# Patient Record
Sex: Female | Born: 1935
Health system: Southern US, Community
[De-identification: ages and names within clinical notes are randomized; demographics above are authoritative.]

## PROBLEM LIST (undated history)

## (undated) DIAGNOSIS — D500 Iron deficiency anemia secondary to blood loss (chronic): Secondary | ICD-10-CM

## (undated) DIAGNOSIS — I10 Essential (primary) hypertension: Secondary | ICD-10-CM

## (undated) DIAGNOSIS — E119 Type 2 diabetes mellitus without complications: Secondary | ICD-10-CM

## (undated) DIAGNOSIS — E7800 Pure hypercholesterolemia, unspecified: Secondary | ICD-10-CM

## (undated) DIAGNOSIS — E039 Hypothyroidism, unspecified: Secondary | ICD-10-CM

## (undated) DIAGNOSIS — K254 Chronic or unspecified gastric ulcer with hemorrhage: Secondary | ICD-10-CM

## (undated) DIAGNOSIS — I5022 Chronic systolic (congestive) heart failure: Principal | ICD-10-CM

## (undated) DIAGNOSIS — T7840XA Allergy, unspecified, initial encounter: Secondary | ICD-10-CM

## (undated) DIAGNOSIS — M1990 Unspecified osteoarthritis, unspecified site: Secondary | ICD-10-CM

## (undated) DIAGNOSIS — J189 Pneumonia, unspecified organism: Secondary | ICD-10-CM

## (undated) DIAGNOSIS — D649 Anemia, unspecified: Secondary | ICD-10-CM

## (undated) DIAGNOSIS — E669 Obesity, unspecified: Secondary | ICD-10-CM

## (undated) DIAGNOSIS — Z9289 Personal history of other medical treatment: Secondary | ICD-10-CM

## (undated) DIAGNOSIS — K31819 Angiodysplasia of stomach and duodenum without bleeding: Secondary | ICD-10-CM

## (undated) HISTORY — PX: TOTAL KNEE ARTHROPLASTY: SHX125

## (undated) HISTORY — DX: Anemia, unspecified: D64.9

## (undated) HISTORY — DX: Chronic systolic (congestive) heart failure: I50.22

## (undated) HISTORY — DX: Allergy, unspecified, initial encounter: T78.40XA

## (undated) HISTORY — PX: REVISION TOTAL KNEE ARTHROPLASTY: SHX767

## (undated) HISTORY — DX: Pure hypercholesterolemia, unspecified: E78.00

## (undated) HISTORY — PX: FRACTURE SURGERY: SHX138

## (undated) HISTORY — DX: Obesity, unspecified: E66.9

## (undated) HISTORY — PX: TOTAL THYROIDECTOMY: SHX2547

## (undated) HISTORY — DX: Hypothyroidism, unspecified: E03.9

## (undated) HISTORY — PX: JOINT REPLACEMENT: SHX530

## (undated) HISTORY — DX: Essential (primary) hypertension: I10

## (undated) HISTORY — PX: TONSILLECTOMY: SUR1361

## (undated) HISTORY — DX: Unspecified osteoarthritis, unspecified site: M19.90

## (undated) SURGERY — Surgical Case
Anesthesia: *Unknown

---

## 2000-02-15 ENCOUNTER — Emergency Department (HOSPITAL_COMMUNITY): Admission: EM | Admit: 2000-02-15 | Discharge: 2000-02-15 | Payer: Self-pay | Admitting: Emergency Medicine

## 2000-02-15 ENCOUNTER — Encounter: Payer: Self-pay | Admitting: Emergency Medicine

## 2001-07-05 ENCOUNTER — Encounter (INDEPENDENT_AMBULATORY_CARE_PROVIDER_SITE_OTHER): Payer: Self-pay | Admitting: Specialist

## 2001-07-05 ENCOUNTER — Inpatient Hospital Stay (HOSPITAL_COMMUNITY): Admission: RE | Admit: 2001-07-05 | Discharge: 2001-07-09 | Payer: Self-pay | Admitting: Orthopedic Surgery

## 2001-07-28 ENCOUNTER — Encounter: Payer: Self-pay | Admitting: Orthopedic Surgery

## 2001-07-28 ENCOUNTER — Inpatient Hospital Stay (HOSPITAL_COMMUNITY): Admission: RE | Admit: 2001-07-28 | Discharge: 2001-08-01 | Payer: Self-pay | Admitting: Orthopedic Surgery

## 2002-02-08 ENCOUNTER — Ambulatory Visit (HOSPITAL_COMMUNITY): Admission: RE | Admit: 2002-02-08 | Discharge: 2002-02-08 | Payer: Self-pay | Admitting: Gastroenterology

## 2003-06-03 ENCOUNTER — Encounter: Payer: Self-pay | Admitting: Orthopedic Surgery

## 2003-06-05 ENCOUNTER — Inpatient Hospital Stay (HOSPITAL_COMMUNITY): Admission: RE | Admit: 2003-06-05 | Discharge: 2003-06-09 | Payer: Self-pay | Admitting: Orthopedic Surgery

## 2003-06-05 ENCOUNTER — Encounter: Payer: Self-pay | Admitting: Orthopedic Surgery

## 2003-07-23 ENCOUNTER — Ambulatory Visit: Admission: RE | Admit: 2003-07-23 | Discharge: 2003-07-23 | Payer: Self-pay | Admitting: Family Medicine

## 2003-12-18 ENCOUNTER — Encounter: Admission: RE | Admit: 2003-12-18 | Discharge: 2003-12-18 | Payer: Self-pay | Admitting: Orthopedic Surgery

## 2004-12-19 HISTORY — PX: SHOULDER OPEN ROTATOR CUFF REPAIR: SHX2407

## 2004-12-25 ENCOUNTER — Observation Stay (HOSPITAL_COMMUNITY): Admission: RE | Admit: 2004-12-25 | Discharge: 2004-12-27 | Payer: Self-pay | Admitting: Orthopedic Surgery

## 2006-05-31 ENCOUNTER — Ambulatory Visit: Payer: Self-pay | Admitting: Family Medicine

## 2006-08-03 ENCOUNTER — Ambulatory Visit: Payer: Self-pay | Admitting: Family Medicine

## 2006-11-11 ENCOUNTER — Ambulatory Visit: Payer: Self-pay | Admitting: Family Medicine

## 2007-04-18 ENCOUNTER — Ambulatory Visit: Payer: Self-pay | Admitting: Family Medicine

## 2007-05-26 ENCOUNTER — Ambulatory Visit: Payer: Self-pay | Admitting: Family Medicine

## 2007-10-06 ENCOUNTER — Ambulatory Visit: Payer: Self-pay | Admitting: Family Medicine

## 2008-02-14 ENCOUNTER — Ambulatory Visit: Payer: Self-pay | Admitting: Family Medicine

## 2008-06-12 ENCOUNTER — Ambulatory Visit: Payer: Self-pay | Admitting: Family Medicine

## 2008-10-11 ENCOUNTER — Ambulatory Visit: Payer: Self-pay | Admitting: Family Medicine

## 2009-01-23 ENCOUNTER — Ambulatory Visit: Payer: Self-pay | Admitting: Family Medicine

## 2009-04-10 ENCOUNTER — Ambulatory Visit: Payer: Self-pay | Admitting: Family Medicine

## 2009-07-22 ENCOUNTER — Ambulatory Visit: Payer: Self-pay | Admitting: Family Medicine

## 2009-08-04 ENCOUNTER — Ambulatory Visit: Payer: Self-pay | Admitting: Family Medicine

## 2010-04-02 ENCOUNTER — Ambulatory Visit: Payer: Self-pay | Admitting: Family Medicine

## 2010-08-03 ENCOUNTER — Ambulatory Visit: Payer: Self-pay | Admitting: Family Medicine

## 2010-12-02 ENCOUNTER — Ambulatory Visit (INDEPENDENT_AMBULATORY_CARE_PROVIDER_SITE_OTHER): Payer: Medicare Other | Admitting: Family Medicine

## 2010-12-02 DIAGNOSIS — E119 Type 2 diabetes mellitus without complications: Secondary | ICD-10-CM

## 2010-12-02 DIAGNOSIS — I10 Essential (primary) hypertension: Secondary | ICD-10-CM

## 2010-12-02 DIAGNOSIS — R609 Edema, unspecified: Secondary | ICD-10-CM

## 2010-12-02 DIAGNOSIS — B351 Tinea unguium: Secondary | ICD-10-CM

## 2011-02-05 NOTE — Discharge Summary (Signed)
NAME:  Connie [name], Connie [name]               ACCOUNT NO.:  [id number]   MEDICAL RECORD NO.:  [id number]                   PATIENT TYPE:  INP   LOCATION:  0463                                 FACILITY:  [facility]   PHYSICIAN:  Ollen Gross, M.D.                 DATE OF BIRTH:  [birth year]   DATE OF ADMISSION:  06/05/2003  DATE OF DISCHARGE:  06/09/2003                                 DISCHARGE SUMMARY   ADMISSION DIAGNOSES:  1. Failed left total knee arthroplasty.  2. Hypertension.  3. Diabetes mellitus type 2.  4. Hypothyroidism.  5. Hypercholesterolemia.   DISCHARGE DIAGNOSES:  1. Aseptic loosening left total knee, status post left total knee     arthroplasty revision.  2. Postoperative blood loss anemia.  3. Postoperative hypokalemia improved.  4. Hypertension.  5. Diabetes mellitus type 2.  6. Hypothyroidism.  7. Hypercholesterolemia.   PROCEDURE:  The patient was taken to the operating room on June 05, 2003, and underwent a left total knee arthroplasty revision by Dr. Ollen Gross, assisted by Patrica Duel, P.A.-C., under general anesthesia.  Hemovac drain x1, tourniquet time 61 minutes at 300 mmHg and down for 10  then up an additional 28 minutes at 300 mmHg.   CONSULTATIONS:  None.   BRIEF HISTORY:  The patient is a 77 year old female who had a left total  knee done about 8-10 years ago by Dr. Meade Maw.  She recently developed some  significant pain and dysfunction.  X-rays showed complete wear through the  polyethylene with varus deformity of the knee.  She had evident lysis under  the tibial tray and it was felt that she had polyethylene wear and also  possibly to include loosing of the total knee prosthesis and felt she would  benefit from undergoing a revision.  The patient was subsequently admitted  to the hospital.   LABORATORY DATA:  CBC:  On admission, hemoglobin 13.3, hematocrit 39.4,  white cell count 8.6, red cell count 4.83, differential  within normal  limits.  Postoperative hemoglobin 10.4, hematocrit 30.5.  Last noted  hemoglobin 8.8, hematocrit 25.3.  PT/PTT on admission 12.8/31 respectively  with INR 0.9.  Serial ProTimes followed per Coumadin protocol.  Last noted  PT/INR 18.9/1.9.  Glucose on admission elevated at 175.  Remaining chemistry  panel all within normal limits.  Serial BMETs were followed.  Potassium  dropped from 4.1 down to 3.3.  It is back up to 3.7.  Calcium dropped from  10.2 down to 7.7.  Urinalysis on admission positive glucose;  otherwise,  negative.  Blood group type A positive.   EKG dated June 03, 2003, unusual P axis, possible ectopic atrial  rhythm, possible inferior infarction age undetermined, nonspecific ST and T  changes confirmed by Dr. Meade Maw.   A two-view chest x-ray June 03, 2003:  Slight cardiomegaly without  acute abnormality of the true chest.   HOSPITAL COURSE:  The patient was admitted to  [facility], taken to  the OR and underwent the above-stated procedure without complication.  The  patient tolerated the procedure well, later sent to recovery, and then to  the orthopedic floor for continued postoperative care.  Vital signs were  followed.  The patient was given 24 hours of postoperative IV antibiotics in  the form of Ancef.  She was placed on Coumadin for DVT prophylaxis.  PT and  OT were consulted to assist with gait training, ambulation, and ADLs. She  was placed weightbearing as tolerated to the left lower leg.  Hemovac drain  placed at the time of surgery was pulled on postoperative day #1.  She had a  fairly rough night the first night or two.  She felt some congestion, some  drainage.  She was placed on some decongestants for the drainage.  She was  initially placed on PCA and p.o. analgesics.  She was later weaned over to  p.o. medications.  The PCA was discontinued.   She did have a drop in her potassium down to 3.3 and was placed on  K-Dur.  The potassium came back up.  From the therapy standpoint, the patient  actually did very well with her therapy.  She was up ambulating  approximately 125 feet by day #2 and then greater than 200 feet by day #3.  She had been weaned over to p.o. medications and her pain was under much  better control.  She was doing extremely well with physical therapy.  Therefore, she was decided to be discharged home by day #4, June 09, 2003.   PLAN:  1. The patient is discharged home on June 09, 2003.  2. For discharge diagnoses, please see above.  3. Discharge medications:  Percocet, Robaxin, Coumadin.  4. Diet:  Low-sodium diabetic diet.  5. Activity:  Weightbearing as tolerated.  Home health PT and home health     nursing for total knee protocol.  6. Followup in two weeks from surgery.   DISPOSITION:  To home.   CONDITION ON DISCHARGE:  Improved.     Alexzandrew Tessie Fass, P.A.-C           Ollen Gross, M.D.    ALP/MEDQ  D:  07/12/2003  T:  07/12/2003  Job:  [id number]

## 2011-02-05 NOTE — Discharge Summary (Signed)
[facility]  Patient:    Connie [name], Connie [name] Visit Number: [id number] MRN: [id number]          Service Type: SUR Location: 4W 0452 01 Attending Physician:  Loanne Drilling Dictated by:   Marcie Bal Troncale, P.A.C. Admit Date:  07/28/2001 Discharge Date: 08/01/2001                             Discharge Summary  PRINCIPAL DIAGNOSES: 1. Failed right total knee arthroplasty. 2. Hyponatremia, resolved. 3. Positive fluid balance.  SECONDARY DIAGNOSES: 1. Hypothyroidism. 2. Hypertension. 3. Hyperlipidemia. 4. Type 2 diabetes.  SURGICAL PROCEDURE:  Resection arthroplasty right knee with insertion of antibiotic impregnated spacer by Dr. Lequita Halt with the assistance of Dr. Ranell Patrick on July 05, 2001.  Please see operative summary for further details.  CONSULTATIONS:  None.  LABORATORY DATA:  Preoperative hemoglobin and hematocrit were 12.6 and 37.3, respectively.  Postoperatively, she remained stable with a hemoglobin in the 10.3 to 10.9 range.  Preoperative PT/INR were 12.5 and 0.9, respectively, with a PTT of 28.  Postoperative prior to discharge her INR was 2.0.  Preoperative sodium and potassium were 143 and 4.2, respectively.  BUN and creatinine of 11 and 0.7, respectively.  Postoperatively, she had a low sodium of 132, which improved to 133 on July 08, 2001.  Potassium remained within normal limits. Urinalysis preoperatively was all within normal limits, all negative. Cultures from the right knee showed no organisms and no white blood cells.  No anaerobes were isolated.  CHIEF COMPLAINT:  Problems with my right knee.  HISTORY OF PRESENT ILLNESS:  The patient is a 77 year old female who had undergone total knee arthroplasty on the left in 1993, and on the right in 1998.  About a year ago she began developing medial aspect pain about the right knee.  She had progressive angulation and deformities to that knee and worsening pain and  motion.  She also had some laxity with valgus stressing. X-rays demonstrated some loosening of the tibial component.  Because of this, it was elected she undergo surgical intervention.  There was also concern that she could have an infection going on concurrently.  She was taken to the operating room on July 05, 2001, for surgical intervention.  Prior to surgery, she received preoperative medical evaluation clearance by Dr. Carollee Sires. from [facility].  HOSPITAL COURSE:  Following the surgical procedure, the patient was taken to the PACU in stable condition, transferred to the orthopedic floor in good condition.  She was started on Coumadin for deep venous thrombosis prophylaxis.  Wound was examined on postoperative day #2, and subsequently found to be clean, dry, and intact without signs or symptoms of infection. She did have a low sodium postoperatively, and had a fluid restriction.  This gradually did improve.  Also, she was found to have positive fluid balance, and was given some Lasix with good results.  Cultures were followed postoperatively, and there was no evidence of any anaerobic or aerobic growth.  Cultures were followed anticipating a final culture prior to discharge.  By postoperative day #4, she did have final cultures which were again negative with no growth anaerobic or aerobic.  Therefore, it was felt she was stable and ready for discharge home.  DISPOSITION:  The patient is being discharged home with [facility].  DIET:  Low sodium diabetic.  FOLLOWUP:  Dr. Lequita Halt in approximately 10 days.  Call  [phone] for an appointment.  ACTIVITY:  Weightbearing as tolerated in the knee immobilizer.  Once daily dressing changes to the knee.  She may shower on postoperative day #5.  DISCHARGE MEDICATIONS: 1. Trinsicon one p.o. t.i.d. p.c. 2. Robaxin 500 mg p.o. q.8h. p.r.n. spasm. 3. Percocet 5/325 mg one or two p.o. q.4-6h. p.r.n.  pain. 4. Coumadin per pharmacy dosing. 5. She is to resume her regular home medications.  CONDITION ON DISCHARGE:  Good and improved. Dictated by:   Marcie Bal Troncale, P.A.C. Attending Physician:  Loanne Drilling DD:  08/23/01 TD:  08/23/01 Job: [id number]

## 2011-02-05 NOTE — Discharge Summary (Signed)
[facility]  Patient:    Connie [name], Connie [name] Visit Number: [id number] MRN: [id number]          Service Type: SUR Location: 4W 0452 01 Attending Physician:  Loanne Drilling Dictated by:   Irena Cords, P.A.-C. Admit Date:  07/28/2001 Discharge Date: 08/01/2001                             Discharge Summary  PRIMARY DIAGNOSES: 1. Resection arthroplasty, right knee. 2. Acute blood loss anemia.  SECONDARY DIAGNOSES: 1. Hypertension. 2. Hypothyroidism. 3. Hyperlipidemia. 4. Type 2 diabetes.  SURGICAL PROCEDURE:  Reimplantation right total knee arthroplasty by Dr. Lequita Halt with assistance of Dr. Ranell Patrick on July 28, 2001.  Please see operative summary for further details.  CONSULTATIONS:  Dr. Thomasena Edis in physical medicine rehabilitation.  LABORATORY DATA:  Preop H&H were 10.5 and 31.0 on November 5.  She reached a low on November 10 of 6.9 and 19.8, respectively.  This improved after transfusion to 9.0 and 25.6, respectively.  PT and INR were 22.9 and 2.6, respectively on November 5.  On November 8, she had an INR of 1.3.  This then improved to the range of 1.8-2.1 prior to discharge.  Preop sodium and potassium 141 and 3.8.  This remained stable postop and prior to discharge was 137 to 3.6, respectively.  BUN and creatinine remained stable at 11 and 0.6-0.8, respectively.  Urinalysis on November 5 showed moderate hemoglobin, moderate leukocyte esterase, negative nitrite.  She had rare epithelial cells and 3-6 red blood cells and 3-6 white blood cells with just rare bacteria. Gram stains from the knee showed rare WBCs and rare gram-positive rods; that was on November 8.  Wound culture from November 8 showed no growth x 2 days. Anaerobic also no growth.  X-ray from November 8 showed impression of a fracture associated with the medial aspect of the proximal tibia and questionable fracture distal aspect of the distal portion of the  prosthesis.  HOSPITAL COURSE:  Following the surgical procedure, the patient was taken to PACU in stable condition and transferred to the orthopedic floor in good condition.  She did well during her hospital stay.  She was started in CPM full range of motion.  She was on Coumadin for DVT prophylaxis.  She received routine postoperative antibiotics and pain medications.  Postop day two, she was transfused two units of packed red blood cells for a low hemoglobin of 6.9.  After transfusion, she improved to 9.0 and remained stable afterwards. The incision was examined on postop day two and subsequently found to be clean, dry, and intact without signs or symptoms of infection.  On November 11, physical medicine rehabilitation evaluated the patient but felt she was progressing so well that she would be a candidate for home health physical therapy.  By postop day four, she was stable and ready for discharge home.  DISCHARGE PLANS:  She is being discharged home with [facility].  DISCHARGE MEDICATIONS: 1. Colace 100 mg p.o. b.i.d. 2. Trinsicon 1 p.o. t.i.d. x 3 weeks. 3. Coumadin per pharmacy dosing. 4. Percocet 5/325, 1-2 p.o. q.4-6 hours p.r.n. pain. 5. Robaxin 500 mg p.o. q.8h. p.r.n. spasm. 6. She is to resume her home medications.  DIET:  Low sodium diabetic diet.  FOLLOW-UP:  In two weeks with Dr. Lequita Halt.  Call [phone] for an appointment.  ACTIVITY:  Weightbearing as tolerated.  She is not to exceed 90  degrees of knee flexion.  She is to have home CPM through [facility].  Genevieve Norlander for home health needs.  CONDITION ON DISCHARGE:  Improved. Dictated by:   Irena Cords, P.A.-C. Attending Physician:  Loanne Drilling DD:  08/23/01 TD:  08/23/01 Job: [id number]

## 2011-02-05 NOTE — H&P (Signed)
[facility]  Patient:    Connie [name], FOUCHER Visit Number: [id number] MRN: [id number]          Service Type: SUR Location: 4W 0452 01 Attending Physician:  Loanne Drilling Admit Date:  07/28/2001 Discharge Date: 08/01/2001                           History and Physical  BRIEF HISTORY:  This is a 77 year old female who had a resection arthroplasty done 2 weeks prior to this admission and at that time had revision planned secondary to abnormality; however, tissue quality was consistent with probable infection; therefore, it was postponed, although her cultures ended up coming back negative.  The wound has healed nicely.  She now presents for reimplantation.  PAST MEDICAL Histotrophic SURGICAL HISTORY:  All other medical records are unchanged from her previous 10/16 visit.  PHYSICAL EXAMINATION:  Other than showing a well-healed incision and no significant effusion of the right knee is unchanged.  PLAN:  For reimplantation of right total knee arthroplasty.  Please see previous history and physical for details. Attending Physician:  Loanne Drilling DD:  08/29/01 TD:  08/30/01 Job: [id number]

## 2011-02-05 NOTE — Op Note (Signed)
[facility]  Patient:    Connie [name], Connie [name] Visit Number: [id number] MRN: [id number]          Service Type: SUR Location: 4W 0452 01 Attending Physician:  Loanne Drilling Dictated by:   Ollen Gross, M.D. Proc. Date: 07/28/01 Admit Date:  07/28/2001                             Operative Report  PREOPERATIVE DIAGNOSIS:  Resection arthroplasty right knee.  POSTOPERATIVE DIAGNOSIS:  Resection arthroplasty right knee.  PROCEDURE:  Reimplantation, right total knee arthroplasty.  SURGEON:  Ollen Gross, M.D.  ASSISTANT:  Almedia Balls. Ranell Patrick, M.D.  ANESTHESIA:  General.  ESTIMATED BLOOD LOSS:  300 .  DRAINS:  Hemovac x 1.  COMPLICATION:  Stable condition to recovery.  CLINICAL NOTE:  The patient is a 77 year old female who had a resection arthroplasty done two weeks ago and at the time we planned new revision secondary to abnormality and tissue quality consistent with probable infection.  Her cultures all ended up coming back negative.  The wound has now healed nicely.  She presents for reimplantation.  PROCEDURE IN DETAIL:  After the successful administration of general anesthetic, a tourniquet was placed high on her right thigh and the right lower extremity prepped and draped in the usual sterile fashion.  A 10 blade was used to make an incision through the previous incision, down through the subcutaneous tissue to the level of the extensor mechanism.  A fresh blade was used to make a medial parapatellar arthrotomy, and then the old sutures were removed.  Soft tissue of the medial tibia subperiosteally elevated to the joint line with the knife and semimembranosus dispersed with a curved osteotome.  Soft tissue of the proximal and lateral tibia was elevated, attention being made to avoid the patellar tendon on the tibial tubercle. A quadriceps snip was again necessary to evert the patella.  The knee was then flexed.  The antibiotic  spacer removed.  Fluid was sent for Gram stain CNS. The granulation tissue was removed and the medial and lateral gutters created around the distal femur.  Excellent exposure is now obtained.  Canal reaming is then performed up to 14 mm on the femoral side.  We got up to a 13 on the tibial side and decided to use a 13 cemented short stem.  Intermedullary alignment guide was placed on the tibia and the proximal portion is resected another 1-2 mm to get down to a healthy-appearing bony rim.  She really did not have much of a tibial defect at all.  The femoral side is then addressed with the 14 mm alignment rod placed in 5 degrees of valgus and a distal femoral cut made.  In order to get any resection at all we had to go up to 4 mm, thus we are going to use the distal augments medial and lateral out 4 mm.  The AP block is placed for a size 2.5 and 12.5 mm spacer placed to mark rotation at 90 degrees of flexion.  The trial had to be placed in the +2 position, effectively raising the stem and lowering the femoral component so it would rest on the anterior cortex of the femur.  The AP cuts are subsequently made.  This would be a 4 mm posterolateral defect which is going to be made up with an augment.  The intercondylar block is placed and then chamfer cuts in the  intercondylar made for a posterior stabilized prosthesis. The trial femur with a size 2.5 and then 4 mm distal, medial and lateral augments and 4 mm posterolateral augment was then placed with a 14 x 125 trial stem.  We had an excellent fit.  On the tibial side the size 2 tibia with a 60 mm cemented stem with a 13 mm diameter was placed.  We had good fit on all sides.  Then I started with a 15 mm trial, which led to hyperextension.  The 17.5 also had some hyperextension.  At 20 she had great flexion and extension gap, with excellent varus valgus stability throughout and no hyperextension.  At this point we addressed the patella.   Initial patella thickness is 12.  We just used the saw to take her down to 11 and create a fresh surface.  The plug holes were then drilled for a 38 mm patella and a trial patella was placed which tracks well.  The trial components are removed and then the tibia was punched proximally. The tourniquet was released; total time of 61 min, and then no major bleeding is identified.  We left it down for approximately 10 min.  We prepared the permanent implants.  Once again, on the femoral side with a size 2.5 femur with a 14 x 125 stem in a +2 position, and 4 mm distal, medial and lateral augments and a 4 mm posterolateral augment.  On the tibial side we had a size 2 with a 60 mm x 13 cemented stem.  The cement restrictor is trialed, a size 2 is most appropriate; then placed at the appropriate level in the tibial canal.  At this point we had the tourniquet for 10 min.  The extremity was again wrapped in Esmarch and the tourniquet reinflated.  The cement is then mixed and pressurized into the tibial canal.  The tibial component is then cemented into position, and the femoral component subsequently cemented into position and a 20 mm spacer is placed with the knee held with full extension.  All extruded cement removed.  On the patella a 38 is cemented and held to clamp. Once the cement is fully hardened the permanent 20 mm stabilized plus insert is placed to the tibial tray.  Once again she had excellent stability throughout.  The wound is then copiously irrigated with antibiotic solution and extensor mechanism and the quadriceps snip closed over Hemovac drain with interrupted PDS.  She had some slight attenuation of her patellar tendon, but the insertion was intact, but decided to augment with placing a Mitek anchor and then the Ethibond sutures through the tendon to help shore it up.  Once the extensor mechanism was closed, then subcutaneous was closed with interrupted 2-0 Vicryl.  It was  noted prior to closure that there appeared to be some cement outside of the medial aspect of the tibia proximally.  We found this  and removed it.  We then took an AP and lateral of the knee and showed all the components to be in excellent position, with no evidence of any hole in the tibia or any fracture.  It is possible one of the trial reamers had perforated a small hole, and that is how the cement came out.  This was all proximal to the tip of the stem.  It did not provide any structural compromise.  Once the x-ray was completed, then the skin is stapled and the drain hooked to suction.  The tourniquet is released  with a total of 101 min, with 61 min being the first time and 40 min being the second.  A bulky sterile dressing and then knee immobilizer applied. She is subsequently awakened and transported to recovery room in stable condition.  Of note, halfway through the procedure we got back the results of the STAT Gram stain.  There were rare white cells and it said there was a rare gram-positive rod.  I called the lab to check on this, as that is generally contaminant and I was told that it was one gram-negative rod in every three to four high-power fields.  Given the overall clinical situation and that organism being a frequent contaminant, I felt that it was safe to proceed with reimplanting her.  She had all negative workup otherwise.  We did utilize 2 g of vancomycin in her cement. Dictated by:   Ollen Gross, M.D. Attending Physician:  Loanne Drilling DD:  07/28/01 TD:  07/31/01 Job: [id number]

## 2011-02-05 NOTE — H&P (Signed)
NAME:  Connie [name], Connie [name]               ACCOUNT NO.:  [id number]   MEDICAL RECORD NO.:  [id number]                   PATIENT TYPE:  INP   LOCATION:  NA                                   FACILITY:  [facility]   PHYSICIAN:  Ollen Gross, M.D.                 DATE OF BIRTH:  Jun 02, 1934   DATE OF ADMISSION:  06/05/2003  DATE OF DISCHARGE:                                HISTORY & PHYSICAL   CHIEF COMPLAINT:  Pain in my left knee.   HISTORY OF PRESENT ILLNESS:  Connie [name] is a 77 year old female status  post right total knee revision in November 2002.  She now states she is  having considerable problems with the left knee which has been going on for  a couple months now.  She states she does not remember specific injury, but  notes the knee has become somewhat unstable and she has developed a lot of  swelling in her leg.  She has not had any fevers, chills, or systemic  symptoms.  She had a left total knee replacement by Maisie Fus L. Presson, M.D.  in 1994 and she has been functioning fairly well until this recent episode,  she states.  X-ray reveal the polyethylene is completely worn through the  medial compartment of the left knee with some lysis present underneath the  medial and lateral tibial plateau with no evidence of any compartment  loosening.  Ollen Gross, M.D. feels at this point that it is best for her  to undergo surgery again being left knee polyethylene liner exchange versus  a left total knee revision with bone grafting.  The patient agrees.  Risks  and benefits of the surgery have been discussed with the patient.  The  patient wishes to proceed.   PAST MEDICAL HISTORY:  1. Hypertension.  2. Diabetes mellitus type 2.  3. Hypothyroidism.  4. Hypercholesterolemia.   PAST SURGICAL HISTORY:  1. Left total knee arthroplasty.  2. Right total knee arthroplasty.  3. Right total knee revision.  4. Removal of goiter and thyroidectomy.   MEDICATIONS:  1. Lipitor 40 mg  one p.o. daily.  2. Synthroid 200 mg one p.o. daily.  3. Benazepril/HCTZ 20/12.5 mg one p.o. daily.  4. Avandia 4 mg one p.o. daily.  5. Norvasc 5 mg one p.o. daily.   ALLERGIES:  No known drug allergies.   SOCIAL HISTORY:  The patient denies any tobacco or alcohol use.  She is  married.  She lives in a [place] house with three steps entering the  house.  Her husband will be her care giver after surgery.   FAMILY HISTORY:  Unknown due to the fact that her parents died when she was  very young.   REVIEW OF SYSTEMS:  GENERAL:  Denies fevers, chills, night sweats, bleeding  tendencies.  CNS:  Denies blurry or double vision, seizures, headaches,  paralysis.  RESPIRATORY:  Denies shortness of breath, productive cough,  hemoptysis.  CARDIOVASCULAR:  Denies chest pain, angina, orthopnea.  GASTROINTESTINAL:  Denies nausea, vomiting, diarrhea, constipation, melena,  bloody stools.  GENITOURINARY:  Denies dysuria, hematuria, discharge.  MUSCULOSKELETAL:  Pertinent to HPI.   PHYSICAL EXAMINATION:  VITAL SIGNS:  Blood pressure 158/72, pulse 72,  respirations 12.  GENERAL:  Well-developed, well-nourished 77 year old female.  HEENT:  Normocephalic, atraumatic.  Pupils are equal, round, and reactive to  light.  NECK:  Supple.  No carotid bruit noted.  CHEST:  Clear to auscultation bilaterally.  No wheezes or crackles.  HEART:  Regular rate and rhythm.  No murmurs, rubs, or gallops.  ABDOMEN:  Soft, nontender, nondistended.  Positive bowel sounds x4.  EXTREMITIES:  Whole left lower extremity is swollen.  It is nontender in the  popliteal area.  Range of motion is just from 10 to 80 degrees.  There is  tenderness medially and laterally.  On full extension she has about 5 mm of  valgus laxity, but with a firm pin point on the medial collateral ligament.  The same laxity is present down to 80 degrees of flexion.  There is slight  joint effusion present.  SKIN:  No rashes or lesions.    LABORATORIES:  X-rays reveal polyethylene is completely worn through the  medial compartment of the left knee with some lysis present underneath the  medial and lateral tibial plateaus, but no evidence of any component  loosening.  She now has varus deformity because of loss of the polyethylene  on the medial side.   IMPRESSION:  1. Failed left total knee arthroplasty.  2. Hypertension.  3. Diabetes mellitus type 2.  4. Hypothyroidism.  5. Hypercholesterolemia.   PLAN:  The patient will be admitted to [facility] on June 05, 2003 and undergo a left knee polyethylene liner exchange versus left total  knee revision with tibial bone grafting by Ollen Gross, M.D. on June 05, 2003.     Clarene Reamer, P.A.-C.                   Ollen Gross, M.D.    SW/MEDQ  D:  06/03/2003  T:  06/03/2003  Job:  [id number]   cc:   Sharlot Gowda, M.D.  [address] [place], [place] [address]  Fax: [phone]

## 2011-02-05 NOTE — H&P (Signed)
[facility]  Patient:    Connie [name], Connie [name]. Visit Number: [id number] MRN: [id number]          Service Type: Attending:  Ollen Gross, M.D. Dictated by:   Druscilla Brownie. Shela Nevin, P.A. Adm. Date:  07/05/01   CC:         John L. Elnoria Howard., D.O., [address],  [place], [place] [address] [phone])   History and Physical  DATE OF BIRTH:  [birth year]  CHIEF COMPLAINT:  "Problems with my right knee."  HISTORY OF PRESENT ILLNESS:  This 77 year old female who has had total knee replacement arthroplasties, the left one in 1993 and the right one done in 1998.  About a year ago, she began having pain in the medial aspect of her right knee.  She noticed progressive bowing and varus deformity.  She is having more and more difficulty getting about and now has to use a cane in her left hand.  She had some mild limitation of range of motion from 5-105 degrees.  She has some valgus laxity with stressing.  No crepitus to range of motion.  X-rays show a loose tibial component, particularly on the medial aspect.  The tibial component has going into into a varus tilt as well. It was felt that this patient has a loose tibial component of her total knee prosthesis.  It is felt that she will benefit from revision and she is being admitted for the same.  Complications of surgery have been discussed with her. We have also discussed the fact that she could very well have an infected process going on in there, however, that is way down on our list.  PAST MEDICAL HISTORY:  The patient has hypertension, hypothyroidism, and hyperlipidemia.  She now has diabetes, type 2.  CURRENT MEDICATIONS: 1. Lipitor 20 mg one q.d. 2. Synthroid 200 mcg one q.d. 3. Triamterene HCTZ 37.5/25 mg one q.d. 4. Prandin 0.5 mg one b.i.d. 5. Avandia 4 mg one q.d. 6. Celebrex. 7. Accupril 10 mg one q.d. 8. Darvocet for discomfort.  ALLERGIES:  No known drug allergies.  FAMILY PHYSICIAN:   John L. Elnoria Howard., D.O., [facility].  FAMILY HISTORY:  Noncontributory.  SOCIAL HISTORY:  The patient neither smokes or drinks and is married.  REVIEW OF SYSTEMS:  CNS:  No seizure disorder, paralysis, numbness, or double vision.  Respiratory:  No productive cough.  No hemoptysis.  No shortness of breath.  Cardiovascular:  No chest pain.  No angina.  No orthopnea. Gastrointestinal:  No nausea, vomiting, melena, or bloody stools. Genitourinary:  No discharge, dysuria, or hematuria.  Musculoskeletal: Primarily as in present illness with the right knee.  PHYSICAL EXAMINATION:  An alert, cooperative, friendly, 77 year old, white female.  VITAL SIGNS:  Blood pressure 152/78, pulse 72, respirations 12.  HEENT:  Normocephalic.  PERRLA.  EOM intact.  The oropharynx is clear.  CHEST:  Clear to auscultation.  No rhonchi, rales, or wheezes.  HEART:  Regular rate and rhythm.  No murmurs are heard.  ABDOMEN:  Soft and nontender.  Liver and spleen not felt.  GENITALIA:  Not done, not pertinent to present illness.  RECTAL:  Not done, not pertinent to present illness.  PELVIC:  Not done, not pertinent to present illness.  BREASTS:  Not done, not pertinent to present illness.  EXTREMITIES:  Right knee as in the history of present illness above.  ADMISSION DIAGNOSES: 1. Loosening of right total knee replacement arthroplasty (tibial component). 2. Hypothyroidism. 3. Hypertension.  4. Hyperlipidemia. 5. Type 2 diabetes.  PLAN:  The patient will undergo revision of right total knee replacement arthroplasty.  Plan to have home health after this regular hospitalization. She has been cleared preoperatively by Jonny Ruiz L. Elnoria Howard., D.O., at [facility]. Dictated by:   Druscilla Brownie. Shela Nevin, P.A. Attending:  Ollen Gross, M.D. DD:  07/03/01 TD:  07/03/01 Job: [id number]

## 2011-02-05 NOTE — Procedures (Signed)
[facility]  Patient:    Connie, [name] Visit Number: [id number] MRN: [id number]          Service Type: END Location: ENDO Attending Physician:  Orland Mustard Dictated by:   Llana Aliment. Randa Evens, M.D. Proc. Date: 02/08/02 Admit Date:  02/08/2002 Discharge Date: 02/08/2002   CC:         Ronnald Nian, M.D.   Procedure Report  DATE OF BIRTH:  [birth year]  PROCEDURE:  Colonoscopy.  SURGEON:  James L. Randa Evens, M.D.  MEDICATIONS:  Cefazolin 1 g due to recent knee replacement, fentanyl 50 mcg, and Versed 6 mg IV.  SCOPE:  Olympus pediatric video colonoscope.  INDICATIONS:  Colon cancer screening  DESCRIPTION OF PROCEDURE:  The procedure had been explained to the patient and consent obtained.  With the patient in the left lateral decubitus position, the scope was inserted and advanced under direct visualization.  The prep was quite good.  Using some abdominal pressure, I reached the cecum.  The ileocecal valve, and appendiceal orifice were seen.  The scope was withdrawn. The cecum, ascending colon, hepatic flexure, transverse colon, splenic flexure, descending, and sigmoid colon were seen well upon removal.  No polyps were seen.  Moderate diverticulosis in the sigmoid colon.  The rectum was seen well and was free of polyps.  ASSESSMENT: 1. Normal colon and no evidence of colon polyps. 2. Moderate diverticulosis.  PLAN:  Recommend she follow up with Dr. Susann Givens, her family physician with yearly Hemoccults.  Consider another colon screening in 5-10 years depending on the recommendations at that time. Dictated by:   Llana Aliment. Randa Evens, M.D. Attending Physician:  Orland Mustard DD:  02/08/02 TD:  02/10/02 Job: [id number]

## 2011-02-05 NOTE — Op Note (Signed)
NAME:  Connie [name], Connie [name]               ACCOUNT NO.:  [id number]   MEDICAL RECORD NO.:  [id number]                   PATIENT TYPE:  INP   LOCATION:  0002                                 FACILITY:  [facility]   PHYSICIAN:  Ollen Gross, M.D.                 DATE OF BIRTH:  June 13, 1934   DATE OF PROCEDURE:  06/05/2003  DATE OF DISCHARGE:                                 OPERATIVE REPORT   PREOPERATIVE DIAGNOSIS:  Left knee aseptic loosening of total knee.   POSTOPERATIVE DIAGNOSES:  Left knee aseptic loosening of total knee.   PROCEDURE:  Left total knee arthroplasty revision.   SURGEON:  Ollen Gross, M.D.   ASSISTANT:  Alexzandrew L. Julien Girt, P.A.   ANESTHESIA:  General.   DRAINS:  Hemovac x1.   COMPLICATIONS:  None.   TOURNIQUET TIME:  61 min at 300 mmHg, and down for 10 min then up an  additional 28 min at 300 mmHg.   CONDITION:  Stable to recovery room.   BRIEF CLINICAL NOTE:  [name] is a 77 year old female who had a left  total knee arthroplasty done 8-10 years ago by Dr. Fraser Din.  She recently  developed significant pain and dysfunction.  Radiographs showed complete  wear through her polyethylene with varus deformity.  She had evident lysis  underneath the tibial tray.  Given the significant pain and component  failure, she presents now for either polyethylene versus total knee  arthroplasty revision.   PROCEDURE IN DETAIL:  After successful administration of general anesthetic,  a tourniquet was placed high on the left thigh and the left lower extremity  prepped and draped in the usual sterile fashion.  Extremity was wrapped in  Esmarch.  The knee was flexed.  Tourniquet inflated to 300 mmHg.  A standard  midline incision was made with a 10 blade through subcutaneous tissue to the  level of the extensor mechanism. Then a fresh blade was used to make a  medial parapatellar arthrotomy.   I really did not encounter any fluid within the joint.  There was a  tremendous amount of synovitis and a synovectomy was performed.  Then I  elevated the soft tissue over the proximal and medial tibia to the joint  line with the knife, and the membranes were dispersed with a curved  osteotome.  Soft tissue from the proximal lateral tibia was also elevated,  attention being paid to avoiding the patellar tendon on the tibial tubercle.  I was able to evert the patella, flex the knee 90 degrees, and then we noted  that there was complete breakthrough of the medial side of the polyethylene,  with polyethylene flakes present throughout the joint.   I removed the polyethylene and then I was very concerned about the tibia  possibly being loose.  So, I just placed a 1/4-inch straight osteotome,  interfaced between the tibial component and cement.  The component was  tenting in and out of the cement;  consistent with gross loosening.   I removed the active component.  I then used 1/2 to 1/4 inch straight  osteotomes to disrupt the interface between prosthesis and cement at the  femoral side.  We easily removed this femoral component without any bone  loss.  I then removed all the cement off the surface of both the tibia and  femur; took out the cement plugs.  We did a synovectomy posteriorly also.   I then reamed the canals on the femoral side to 14 mm; tibial side 13 mm.  I  thoroughly irrigated the canals prior to reaming.  I then placed the  intramedullary dye down on the tibial side with a 13 mm dummy stem.  I put  the 3-degree cutting block on, just to remove about 2 mm off the tibial  surface.  This was nice and flush cut.  I needed the offset tibial tray to  clip the stem medial and tray lateral in order to deem appropriate covers.  It was a size 2 tray, with a 13 x 30 cemented systemic extension.  The trial  fit perfectly.   With the 14 mm Gummi stem on the femoral side, we placed a distal cutting  block in order to just skim bone off the surface.  I used  the 5 degree left  valgus alignment guide.  The block is pinned and with the 0 cut there is  essentially no bone removed at all.  Thus we went to 4 mm cut and we were  going to place 4 mm distal augments both medial and lateral.   The AP cutting block was sized to 2.5.  We then placed a 2.5 as it was  determined to be the most appropriate size.  We put the  10 mm spacer block in, held the knee in 90 degrees of flexion and set our  rotations so we would get a symmetric flexion gap.  The rotation ended up  corresponding with the epicondylar axis.   We placed the stem in a plus-2 position, to effectively raise the stem and  lower the anterior cut; so we could get along the bone surface.  The  anterior and posterior cuts were made.  She needed a 4 mm posterior distal  augment.  There was still quite a bit of distance between the anterior  flange and the bone; thus, I decided to add further posterior augmentation  to the effectively lower the component.  Then went to 4 mm up posteromedial,  8 mm posterolateral.   The revision block is then placed to do final chamfer cuts.  Once everything  was completed, then we put the trial prostheses in -- with the tibial side  once again being the size 2, with the stem in the medial offset position and  thus effectively shifting the tray laterally.  A 13 x 30 stem extension.  On  the femoral side was a 14 x 125 stem extension, with a size 2.5 posterior  stabilized femur -- with 4 mm distal augments both medial and lateral, and 8  mm posterior augments laterally, 4 mm posterior augment medially.  The trial  insert is placed, starting with the 10 but it hyperextended; so, I went to  12.5 which had full extension with perfect variance and valgus balance  throughout full range of motion.   The trials were then left in place.  Then I released the tourniquet (total time of 61 min).  We did not  encounter any significant bleeding.  I then  removed the patellar  component with the saw and took out the polyethylene  plugs from the patellar bone.  The residual thickness was 11 mm; I cut down  to 10 mm to get a fresher bone surface.  I used a 38 template to drill the  lug holes; placed the trial patella and it tracked normally.   Once the tourniquet was down for about 10 min, then I rewrapped the leg and  reinflated to 300 mmHg.  We then placed the size 4 cement restrictor, which  we trialed at the appropriate depth in the tibial canal.  I assembled all  the prostheses on the back table, using the sizes and augments as stated  above.  We then thoroughly irrigated the bone with pulsatile lavage, and  mixed the cement.  Once we were ready for the reimplantation we injected  cement into the tibial canal and cemented the tibial component first.  We  then cemented the femoral component with the press-fit stem, thus not  putting any cement on the stem.  A 12 mm trial insert was placed; the knee  held in full extension, all extruded cement removed.  The 38 patella is also  cemented and held with a clamp.  Once the cement had fully hardened, then we  placed the permanent 12.5 posterior stabilized insert.  When reduced, she  had fantastic balance throughout her full range of motion.   The wound was then copiously irrigated with antibiotic solution.  We took  the platelet-rich plasma from the Symphony graft system.  I then placed a  Hemovac drain and closed the extensor mechanism with interrupted #1 PDS.  The tourniquet was then released for a second tourniquet time of 28 min.  The platelet-4 plasma was placed into the subcutaneous tissue which was  closed with interrupted 2-0 Vicryl.  Skin was closed with staples.  The  catheter for Marcaine pain pump is then placed subcutaneously, and the pump  is started.  She had bulky sterile dressings applied.  She was awakened and  transferred to the recovery room in stable condition.                                                Ollen Gross, M.D.    FA/MEDQ  D:  06/05/2003  T:  06/05/2003  Job:  [id number]

## 2011-02-05 NOTE — Op Note (Signed)
[facility]  Patient:    Connie [name], Connie [name] Visit Number: [id number] MRN: [id number]          Service Type: Attending:  Ollen Gross, M.D. Proc. Date: 07/05/01                             Operative Report  PREOPERATIVE DIAGNOSIS:  Failed right total knee arthroplasty.  POSTOPERATIVE DIAGNOSIS:  Failed right total knee arthroplasty.  PROCEDURE:  Resection arthroplasty right knee with insertion of antibiotic-impregnated spacer.  SURGEON:  Ollen Gross, M.D.  ASSISTANT:  Malon Kindle, M.D.  ANESTHESIA:  General.  ESTIMATED BLOOD LOSS:  Minimal.  DRAIN:  Hemovac x 1.  COMPLICATIONS:  None.  CONDITION:  Stable to recovery.  BRIEF CLINICAL NOTE:  Connie [name] is a 77 year old female, who had a right total knee arthroplasty done by Dr. Fraser Din four years ago.  She has progressively increasing pain and varus tilt of her tibial component.  She initially had her components in excellent position with no evidence of abnormality.  This has been progressive over the past year or so.  She presents now for revision arthroplasty.  There is a high index of suspicion of infection, though she is not given any prep antibiotics, and cultures are to be taken.  DESCRIPTION OF PROCEDURE:  After the successful administered of general anesthetic, a tourniquet is placed on her thigh and right lower extremity prepped and draped in the usual sterile fashion.  Extremity was wrapped in Esmarch, knee flexed and tourniquet inflated to 350 mmHg.  The knee is flexed and then incision made with the 10 blade.  This was dissected down to the extensor mechanism.  Medial parapatellar arthrotomy is then made.  There was quite a bit of fluid present upon entering the joint, ______ stained CNS. There was a tremendous amount of synovitis also present.  The synovium sent for frozen section.  The exposure was obtained, clearing the medial and lateral gutters from scar tissue.   The patella would not evert easily, thus we did a quadriceps snip proximally.  The patella then everted easily and knee was flexed 90.  There was no evidence of any failure of the polyethylene.  The femoral component subsequently removed by disrupting the interface between bone and prosthesis.  It came out with minimal if any bone loss.  The tibia had subsided about 1 cm and was grossly loose.  This was all removed, and there was a tremendous amount of fibrous tissue in the tibial component.  This was highly suspicious to me for infection given the entire clinical situation. We removed all of this fibrous tissue and sent that also for frozen section. I then reamed the canals 14 mm on the femoral side, 12 mm on the tibial side. Overall, there was a lot of synovitis present posteriorly, and this was also resected.  Given the overall clinical picture and the concern for infection, we decided to just do resection arthroplasty and place an antibiotic spacer. Her frozen sections came back negative, but again, I had a very high clinical index of suspicion.  We then irrigated the joint with pulsatile lavage, approximately three liters.  The antibiotic spacer was then created using two packs of Palacos cement, 2.4 g tobramycin, 2 g vancomycin.  The spacer was configured to the appropriate gap between the tibia and femur, and then a flange is made to insert under the patella.  Note that the patella component was also  rather easily.  The wound is again irrigated, and the tourniquet is released for a total time of 47 minutes to inspect for any bleeding.  There was no evidence of any significant bleeding.  All minor bleeding stopped with cautery.  The extensor mechanism was then closed over a Hemovac drain with interrupted #1 PDS.  Subcu was closed with interrupted 2-0 Vicryl and skin with staples.  Drain is hooked to suction, incision clean and dry, and a bulky sterile dressing applied.  The patient is  subsequently awakened and transported to recovery in stable condition. Attending:  Ollen Gross, M.D. DD:  07/05/01 TD:  07/06/01 Job: [id number]

## 2011-02-05 NOTE — Op Note (Signed)
Connie [name], Connie [name] NO.:  [id number]   MEDICAL RECORD NO.:  [id number]          PATIENT TYPE:  AMB   LOCATION:  DAY                          FACILITY:  [facility]   PHYSICIAN:  Ollen Gross, M.D.    DATE OF BIRTH:  1934/06/05   DATE OF PROCEDURE:  12/25/2004  DATE OF DISCHARGE:                                 OPERATIVE REPORT   PREOPERATIVE DIAGNOSIS:  Right shoulder impingement, acromioclavicular  arthrosis, rotator cuff tear.   POSTOPERATIVE DIAGNOSIS:  Right shoulder impingement, acromioclavicular  arthrosis, rotator cuff tear.   PROCEDURE:  Right shoulder open subacromial decompression, distal clavicle  resection, rotator cuff repair.   SURGEON:  Ollen Gross, M.D.   ASSISTANT:  Avel Peace, PA-C.   ANESTHESIA:  General with interscalene block.   ESTIMATED BLOOD LOSS:  Minimal.   DRAINS:  None.   COMPLICATIONS:  None.   CONDITION:  Stable to the recovery room.   CLINICAL NOTE:  Connie [name] is a 77 year old female with a long history of  progressively worsening right shoulder pain and dysfunction.  Exam and  history were consistent with possible rotator cuff tear, confirmed by MRI.  She has had previous injections before knowing this was a tear.  She has  also had attempts with nonoperative management, and this has failed.  She  presents now for the above-mentioned procedure.   PROCEDURE IN DETAIL:  After successful administration of interscalene block  and general anesthetic, she is placed sitting upright in a beach-chair  position.  Her right upper extremity and shoulder girdle are isolated from  her trunk with plastic drapes and prepped and draped in the usual sterile  fashion.  A longitudinal incision is made in the skin line from posterior to  anterior, just adjacent to the lateral border of the acromion.  The skin is  cut with the 10 blade through the subcutaneous tissue to the level of the  deltoid fascia and then circumferential flaps  are elevated.  I palpated the  clavicle and longitudinally split the fascia of the distal 1.5 cm of the  clavicle, heading towards the tip of the acromion and the split the deltoid  between its fibers for about 2 cm beyond the lateral border of the acromion.  The entire anterior soft tissue flap is subperiosteally elevated, and then  posteriorly just elevated over the clavicle.  Hohmann retractors are placed  to isolate the distal clavicle, and then 1 cm of distal clavicle is excised  with an oscillating saw.  This effectively decompressed the clavicular spur.  The acromion was a type 2 acromion.  We removed the acromial spur to create  a flat undersurface of the acromion using the oscillating saw.   The bursa is real thickened and adherent to the underlying rotator cuff.  I  had excised the bursa.  She had a large 2 x 2 cm rotator cuff tear with  retraction of the supraspinatus.  I was able to advance it down to the  greater tuberosity.  A trough is made in the greater tuberosity and two  Mitek super anchors are placed.  The sutures are passed  through the free  edge of the tendon, and the tendon is advanced to the trough.  The sutures  are then tied, and the tendon over-sewn to provide a very stable repair.  I  thoroughly irrigated the area, then reattached the deltoid to the acromion  with Ethibond sutures through drill holes.  The subcu tissues are then  closed with interrupted 2-0 Vicryl, subcuticular running 4-0 Monocryl.  The  incision is cleaned and dried.  Steri-Strips and a bulky sterile dressing  applied.  She is placed into a shoulder immobilizer, awakened, and  transported to recovery in stable condition.      FA/MEDQ  D:  12/25/2004  T:  12/25/2004  Job:  [id number]

## 2011-04-19 ENCOUNTER — Other Ambulatory Visit: Payer: Self-pay | Admitting: Family Medicine

## 2011-04-27 ENCOUNTER — Ambulatory Visit (INDEPENDENT_AMBULATORY_CARE_PROVIDER_SITE_OTHER): Payer: Medicare Other | Admitting: Family Medicine

## 2011-04-27 ENCOUNTER — Encounter: Payer: Self-pay | Admitting: Family Medicine

## 2011-04-27 VITALS — BP 120/70 | HR 76 | Temp 98.2°F | Wt 158.0 lb

## 2011-04-27 DIAGNOSIS — J019 Acute sinusitis, unspecified: Secondary | ICD-10-CM

## 2011-04-27 MED ORDER — AMOXICILLIN 875 MG PO TABS: 875.0000 mg | ORAL_TABLET | Freq: Two times a day (BID) | ORAL | Status: AC

## 2011-04-27 NOTE — Patient Instructions (Signed)
Take all the medicine and if not fully back to normal, call me.

## 2011-04-27 NOTE — Progress Notes (Signed)
  Subjective:    Patient ID: Connie [name], female    DOB: 1934-09-20, 77 y.o.   MRN: [id number]  HPI She has a five-week history of nasal congestion and purulent postnasal drainage. She saw a physician in [place] and was given a nose spray and the throat spray ,still no improvement. No fever, chills, sore throat. She does not smoke  Review of Systems     Objective:   Physical Exam alert and in no distress. Tympanic membranes and canals are normal. Throat is clear. Tonsils are normal. Neck is supple without adenopathy or thyromegaly. Cardiac exam shows a regular sinus rhythm without murmurs or gallops. Lungs are clear to auscultation. Nasal mucosa is normal. Tender over frontal and maxillary sinuses.        Assessment & Plan:  Sinusitis Amoxil. She will call of continued difficulty.

## 2011-04-28 ENCOUNTER — Other Ambulatory Visit: Payer: Self-pay | Admitting: Family Medicine

## 2011-06-05 ENCOUNTER — Other Ambulatory Visit: Payer: Self-pay | Admitting: Family Medicine

## 2011-06-07 ENCOUNTER — Encounter: Payer: Self-pay | Admitting: Medical

## 2011-06-07 ENCOUNTER — Ambulatory Visit (INDEPENDENT_AMBULATORY_CARE_PROVIDER_SITE_OTHER): Payer: Medicare Other | Admitting: Medical

## 2011-06-07 VITALS — BP 140/80 | HR 72 | Temp 97.5°F | Resp 18 | Ht <= 58 in | Wt 158.0 lb

## 2011-06-07 DIAGNOSIS — L299 Pruritus, unspecified: Secondary | ICD-10-CM

## 2011-06-07 DIAGNOSIS — F430 Acute stress reaction: Secondary | ICD-10-CM

## 2011-06-07 MED ORDER — DIPHENHYDRAMINE HCL 25 MG PO CAPS: ORAL_CAPSULE | ORAL | Status: DC
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
## 2011-06-18 ENCOUNTER — Telehealth: Payer: Self-pay | Admitting: Family Medicine

## 2011-06-18 NOTE — Telephone Encounter (Signed)
Pt would like you to call her to discuss her scalp itching.  She has tried several treatments and nothing is working.  She doesn't know if it is her nerves or not.  Please call

## 2011-06-18 NOTE — Telephone Encounter (Signed)
She needs an appointment so I can look at her scalp

## 2011-06-19 ENCOUNTER — Other Ambulatory Visit: Payer: Self-pay | Admitting: Family Medicine

## 2011-06-23 ENCOUNTER — Encounter: Payer: Self-pay | Admitting: Medical

## 2011-06-23 ENCOUNTER — Ambulatory Visit (INDEPENDENT_AMBULATORY_CARE_PROVIDER_SITE_OTHER): Payer: Medicare Other | Admitting: Medical

## 2011-06-23 VITALS — BP 170/80 | HR 88 | Temp 97.9°F | Resp 18 | Ht <= 58 in | Wt 154.0 lb

## 2011-06-23 DIAGNOSIS — E039 Hypothyroidism, unspecified: Secondary | ICD-10-CM

## 2011-06-23 DIAGNOSIS — M2550 Pain in unspecified joint: Secondary | ICD-10-CM

## 2011-06-23 DIAGNOSIS — Z79899 Other long term (current) drug therapy: Secondary | ICD-10-CM

## 2011-06-23 DIAGNOSIS — F430 Acute stress reaction: Secondary | ICD-10-CM

## 2011-06-23 DIAGNOSIS — I10 Essential (primary) hypertension: Secondary | ICD-10-CM

## 2011-06-23 DIAGNOSIS — L299 Pruritus, unspecified: Secondary | ICD-10-CM

## 2011-06-23 MED ORDER — CLONAZEPAM 0.5 MG PO TABS: ORAL_TABLET | ORAL | Status: AC

## 2011-06-23 NOTE — Progress Notes (Signed)
Subjective:   HPI  Connie [name] is a 77 y.o. female who presents for recheck.  I saw her 06/07/11 for itching.  Since then she used a round of permethrin cream, use Benadryl for several days, but has had no improvement in her itching.  She still complains of itching of her scalp and neck, feels uneasy all over, and is worried there is something wrong with her. She is having a lot of anxiety related to this. She has been having some crying spells, frustrated that we don't have a diagnosis.  She denies any rash, no new medication changes, no new dose changes, no new foods, no other new exposures. She has been back down to her camper at the beach, but denies any other household contacts with similar itching or rash. She feels healthy otherwise, just worried about itching. Wonders if this is just all her nerves acting up. She feels like she needs something to help calm her nerves down.  No other aggravating or relieving factors.  No other c/o.  The following portions of the patient's history were reviewed and updated as appropriate: allergies, current medications, past family history, past medical history, past social history, past surgical history and problem list.  Past Medical History  Diagnosis Date  . Hypertension   . Obesity   . Arthritis   . Allergy     RHINITIS  . Hypothyroidism   . Hypercholesteremia   . Diabetes mellitus    Review of Systems Constitutional: denies fever, chills, sweats, unexpected weight change, anorexia, fatigue Allergy: no congestion, sneezing Dermatology: denies rash ENT: no runny nose, ear pain, sore throat, hoarseness, sinus pain Cardiology: denies chest pain, palpitations, edema Respiratory: denies cough, shortness of breath, wheezing Gastroenterology: denies abdominal pain, nausea, vomiting, diarrhea, constipation Musculoskeletal: denies arthralgias, myalgias, joint swelling, back pain Ophthalmology: denies vision changes Urology: denies dysuria,  difficulty urinating, hematuria, urinary frequency, urgency Neurology: no headache, weakness, tingling, numbness     Objective:   Physical Exam   General appearance: alert, no distress, WD/WN, white female Skin: scalp and hair normal with some female pattern thinning, but otherwise normal, no rash, no lice;  Skin of face and fingers somewhat tense appearing HEENT: conjunctiva unremarkable, TMs pearly, nares patent, pharynx normal Mouth: MMM, no lesions Neck: supple, nontender, no lymphadenopathy MSK: bony changes of some finger PIPs bilat, nontender, skin seems tight Heart: RRR, normal S1, S2, no murmur Lungs: CTA Abdomen: +bs, soft, non tender, no mass or organomegaly  Assessment :    Encounter Diagnoses  Name Primary?  . Pruritic condition Yes  . Hypothyroidism   . Encounter for long-term (current) use of other medications   . Essential hypertension, benign   . Polyarthralgia   . Acute stress reaction     Plan:   Etiology unclear.  Gave script for low dose Clonazepam to help with her anxiety, advised she can continue to use benadryl prn, ice pack to neck if desired, moisturizing lotion to scalp and neck. She is due for labs, so labs to help further eval symptoms.  Her BP is likely up today due to her stress/anxiety.  She normally has better readings here and at home.  C/t same BP medications.

## 2011-06-25 ENCOUNTER — Other Ambulatory Visit: Payer: Self-pay | Admitting: Medical

## 2011-06-25 ENCOUNTER — Telehealth: Payer: Self-pay | Admitting: Medical

## 2011-06-25 MED ORDER — POTASSIUM CHLORIDE CRYS ER 10 MEQ PO TBCR: 10.0000 meq | EXTENDED_RELEASE_TABLET | Freq: Two times a day (BID) | ORAL | Status: DC

## 2011-06-25 NOTE — Telephone Encounter (Signed)
Called pt lmtrc needs appt if no better.

## 2011-06-28 NOTE — Telephone Encounter (Signed)
Pt called she is much better.  No need for ov.

## 2011-06-29 ENCOUNTER — Telehealth: Payer: Self-pay | Admitting: Family Medicine

## 2011-06-29 NOTE — Telephone Encounter (Signed)
Message copied by Janeice Robinson on Tue Jun 29, 2011  2:33 PM ------      Message from: [place], Kermit Balo      Created: Tue Jun 29, 2011  1:43 PM       See if she sees any improvement since getting on potassium.  Her potassium was quite low.  I want to have her come in for repeat BMET in 2 wk.

## 2011-07-12 ENCOUNTER — Telehealth: Payer: Self-pay | Admitting: Medical

## 2011-07-12 ENCOUNTER — Telehealth: Payer: Self-pay | Admitting: *Deleted

## 2011-07-12 ENCOUNTER — Other Ambulatory Visit: Payer: Medicare Other

## 2011-07-12 DIAGNOSIS — I10 Essential (primary) hypertension: Secondary | ICD-10-CM

## 2011-07-12 NOTE — Telephone Encounter (Signed)
Last note in chart I put that she needs nurse visit BMET lab in 2wk which is NOW.  So, nurse visit for BMET at this time.

## 2011-07-12 NOTE — Telephone Encounter (Signed)
DONE

## 2011-07-12 NOTE — Telephone Encounter (Signed)
PT INFORMED, APPT TODAY

## 2011-07-13 LAB — BASIC METABOLIC PANEL
Glucose, Bld: 162 mg/dL — ABNORMAL HIGH (ref 70–99)
Potassium: 3.9 mEq/L (ref 3.5–5.3)
Sodium: 141 mEq/L (ref 135–145)

## 2011-07-19 ENCOUNTER — Ambulatory Visit (INDEPENDENT_AMBULATORY_CARE_PROVIDER_SITE_OTHER): Payer: Medicare Other | Admitting: Family Medicine

## 2011-07-19 ENCOUNTER — Encounter: Payer: Self-pay | Admitting: Family Medicine

## 2011-07-19 DIAGNOSIS — J069 Acute upper respiratory infection, unspecified: Secondary | ICD-10-CM

## 2011-07-19 DIAGNOSIS — L299 Pruritus, unspecified: Secondary | ICD-10-CM

## 2011-07-19 NOTE — Patient Instructions (Signed)
Continue to treat your symptoms. If your symptoms get worse towards the end of the week call me.

## 2011-07-19 NOTE — Progress Notes (Signed)
  Subjective:    Patient ID: Connie [name], female    DOB: 1934-08-30, 77 y.o.   MRN: [id number]  HPI She has a four-day history that started with runny nose, slight earache and sore throat. Now she is having PND and slight cough. No fever, chills or earache. She also complains of an itchy scalp. She has not noted any other skin lesions. She has tried Rid on 2 occasions with no benefit. She is quite upset over this to She does have a trip planned at the end of the week.  Review of Systems     Objective:   Physical Exam alert and in no distress. Tympanic membranes and canals are normal. Throat is clear. Tonsils are normal. Neck is supple without adenopathy or thyromegaly. Cardiac exam shows a regular sinus rhythm without murmurs or gallops. Lungs are clear to auscultation. Exam of her scalp shows no lesions.      Assessment & Plan:   1. Pruritus of scalp  Ambulatory referral to Dermatology  2. Acute URI     reported care for the URI. Her work her to dermatology. She will call me if continued difficulty with cold symptoms.

## 2011-07-23 ENCOUNTER — Other Ambulatory Visit: Payer: Self-pay | Admitting: Family Medicine

## 2011-07-23 ENCOUNTER — Telehealth: Payer: Self-pay | Admitting: Family Medicine

## 2011-07-23 MED ORDER — AMOXICILLIN 875 MG PO TABS: 875.0000 mg | ORAL_TABLET | Freq: Two times a day (BID) | ORAL | Status: AC

## 2011-07-23 NOTE — Telephone Encounter (Signed)
Let her know that I called an antibiotic and for her.

## 2011-07-23 NOTE — Telephone Encounter (Signed)
Pt notifed of med called in for her

## 2011-07-23 NOTE — Telephone Encounter (Signed)
Is this okay to refill? 

## 2011-07-23 NOTE — Telephone Encounter (Signed)
Pt states that when she saw you this week you told her if she wasn't much better by today you would call her in an rx.  Pt states she is not any better. Pt uses [facility] in [place].

## 2011-07-23 NOTE — Telephone Encounter (Signed)
Amoxil called in for continued difficulty with cough and congestion 

## 2011-08-06 ENCOUNTER — Other Ambulatory Visit: Payer: Self-pay | Admitting: Family Medicine

## 2011-09-01 ENCOUNTER — Other Ambulatory Visit: Payer: Self-pay | Admitting: Family Medicine

## 2011-09-06 ENCOUNTER — Other Ambulatory Visit: Payer: Self-pay | Admitting: Family Medicine

## 2011-09-29 ENCOUNTER — Other Ambulatory Visit: Payer: Self-pay | Admitting: Family Medicine

## 2011-09-30 NOTE — Telephone Encounter (Signed)
Is this ok?

## 2011-11-09 ENCOUNTER — Other Ambulatory Visit: Payer: Self-pay | Admitting: Family Medicine

## 2011-11-26 ENCOUNTER — Other Ambulatory Visit: Payer: Self-pay | Admitting: Family Medicine

## 2011-12-08 ENCOUNTER — Other Ambulatory Visit: Payer: Self-pay | Admitting: Family Medicine

## 2012-01-04 ENCOUNTER — Other Ambulatory Visit: Payer: Self-pay | Admitting: Family Medicine

## 2012-01-28 ENCOUNTER — Encounter: Payer: Self-pay | Admitting: Medical

## 2012-01-28 ENCOUNTER — Ambulatory Visit (INDEPENDENT_AMBULATORY_CARE_PROVIDER_SITE_OTHER): Payer: Medicare Other | Admitting: Medical

## 2012-01-28 ENCOUNTER — Other Ambulatory Visit: Payer: Self-pay | Admitting: Family Medicine

## 2012-01-28 VITALS — BP 150/80 | HR 76 | Temp 98.2°F | Resp 16 | Wt 158.0 lb

## 2012-01-28 DIAGNOSIS — S199XXA Unspecified injury of neck, initial encounter: Secondary | ICD-10-CM

## 2012-01-28 DIAGNOSIS — L089 Local infection of the skin and subcutaneous tissue, unspecified: Secondary | ICD-10-CM

## 2012-01-28 DIAGNOSIS — W19XXXA Unspecified fall, initial encounter: Secondary | ICD-10-CM

## 2012-01-28 DIAGNOSIS — S0993XA Unspecified injury of face, initial encounter: Secondary | ICD-10-CM

## 2012-01-28 MED ORDER — CEPHALEXIN 500 MG PO CAPS: 500.0000 mg | ORAL_CAPSULE | Freq: Four times a day (QID) | ORAL | Status: AC

## 2012-01-28 NOTE — Patient Instructions (Signed)
Apply ice pack to face/lips, but use rag in between face and ice.  Use some OTC antibiotic ointment such as triple antibiotic to the lip.  Begin Keflex antibiotic, twice daily for ten days  Consider Advil once to twice daily for a few days for inflammation.
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
## 2012-02-12 ENCOUNTER — Other Ambulatory Visit: Payer: Self-pay | Admitting: Family Medicine

## 2012-03-01 ENCOUNTER — Other Ambulatory Visit: Payer: Self-pay | Admitting: Family Medicine

## 2012-03-13 ENCOUNTER — Telehealth: Payer: Self-pay | Admitting: Internal Medicine

## 2012-03-13 MED ORDER — ATORVASTATIN CALCIUM 40 MG PO TABS: 40.0000 mg | ORAL_TABLET | Freq: Every day | ORAL | Status: DC

## 2012-03-13 NOTE — Telephone Encounter (Signed)
RX FOR 30 DAYS WAS SENT INTO THE PHARMACY AND THE PATIENT DID SCHEDULE A F/U APPOINTMENT. CLS

## 2012-03-15 ENCOUNTER — Telehealth: Payer: Self-pay | Admitting: Family Medicine

## 2012-03-15 ENCOUNTER — Other Ambulatory Visit: Payer: Self-pay | Admitting: Medical

## 2012-03-15 ENCOUNTER — Encounter: Payer: Self-pay | Admitting: Medical

## 2012-03-15 ENCOUNTER — Ambulatory Visit (INDEPENDENT_AMBULATORY_CARE_PROVIDER_SITE_OTHER): Payer: Medicare Other | Admitting: Medical

## 2012-03-15 VITALS — BP 102/60 | HR 62 | Temp 97.7°F | Resp 16 | Wt 157.0 lb

## 2012-03-15 DIAGNOSIS — L602 Onychogryphosis: Secondary | ICD-10-CM

## 2012-03-15 DIAGNOSIS — E119 Type 2 diabetes mellitus without complications: Secondary | ICD-10-CM

## 2012-03-15 DIAGNOSIS — I10 Essential (primary) hypertension: Secondary | ICD-10-CM

## 2012-03-15 DIAGNOSIS — E039 Hypothyroidism, unspecified: Secondary | ICD-10-CM

## 2012-03-15 DIAGNOSIS — E1169 Type 2 diabetes mellitus with other specified complication: Secondary | ICD-10-CM | POA: Insufficient documentation

## 2012-03-15 DIAGNOSIS — L84 Corns and callosities: Secondary | ICD-10-CM

## 2012-03-15 DIAGNOSIS — L608 Other nail disorders: Secondary | ICD-10-CM

## 2012-03-15 DIAGNOSIS — E785 Hyperlipidemia, unspecified: Secondary | ICD-10-CM

## 2012-03-15 LAB — T4, FREE: Free T4: 1.63 ng/dL (ref 0.80–1.80)

## 2012-03-15 LAB — CBC
Hemoglobin: 12 g/dL (ref 12.0–15.0)
MCH: 27.3 pg (ref 26.0–34.0)
MCV: 82.5 fL (ref 78.0–100.0)
Platelets: 364 10*3/uL (ref 150–400)
RBC: 4.39 MIL/uL (ref 3.87–5.11)
WBC: 7.8 10*3/uL (ref 4.0–10.5)

## 2012-03-15 LAB — TSH: TSH: 0.895 u[IU]/mL (ref 0.350–4.500)

## 2012-03-15 LAB — COMPREHENSIVE METABOLIC PANEL
Albumin: 4.2 g/dL (ref 3.5–5.2)
Alkaline Phosphatase: 60 U/L (ref 39–117)
BUN: 43 mg/dL — ABNORMAL HIGH (ref 6–23)
CO2: 29 mEq/L (ref 19–32)
Glucose, Bld: 174 mg/dL — ABNORMAL HIGH (ref 70–99)
Total Bilirubin: 0.8 mg/dL (ref 0.3–1.2)
Total Protein: 6.5 g/dL (ref 6.0–8.3)

## 2012-03-15 LAB — LIPID PANEL: LDL Cholesterol: 69 mg/dL (ref 0–99)

## 2012-03-15 MED ORDER — FUROSEMIDE 20 MG PO TABS: 20.0000 mg | ORAL_TABLET | Freq: Every day | ORAL | Status: DC

## 2012-03-15 MED ORDER — ATORVASTATIN CALCIUM 40 MG PO TABS: 40.0000 mg | ORAL_TABLET | Freq: Every day | ORAL | Status: DC

## 2012-03-15 MED ORDER — LEVOTHYROXINE SODIUM 125 MCG PO TABS: 125.0000 ug | ORAL_TABLET | Freq: Every day | ORAL | Status: DC

## 2012-03-15 MED ORDER — PIOGLITAZONE HCL-METFORMIN HCL 15-850 MG PO TABS: 1.0000 | ORAL_TABLET | Freq: Two times a day (BID) | ORAL | Status: DC

## 2012-03-15 MED ORDER — AMLODIPINE BESYLATE 10 MG PO TABS: 10.0000 mg | ORAL_TABLET | Freq: Every day | ORAL | Status: DC

## 2012-03-15 MED ORDER — CLONIDINE HCL 0.1 MG PO TABS: 0.1000 mg | ORAL_TABLET | Freq: Two times a day (BID) | ORAL | Status: DC

## 2012-03-15 MED ORDER — BENAZEPRIL-HYDROCHLOROTHIAZIDE 20-25 MG PO TABS: 1.0000 | ORAL_TABLET | Freq: Every day | ORAL | Status: DC

## 2012-03-15 MED ORDER — POTASSIUM CHLORIDE CRYS ER 10 MEQ PO TBCR: 10.0000 meq | EXTENDED_RELEASE_TABLET | Freq: Two times a day (BID) | ORAL | Status: DC

## 2012-03-15 NOTE — Progress Notes (Signed)
Subjective:   HPI  Connie [name] is a 78 y.o. female who presents for routine chronic disease f/u. Here for f/u on diabetes, cholesterol, thyroid disease, and hypertension.  Compliant with medication except she just realized she hasn't been taking her Kdur for months.  Not sure why she forgot to take this.  Walks for exercise, eats healthy. Checks glucoses about once per week, sees 120-130s max. No other aggravating or relieving factors.    No other c/o.  The following portions of the patient's history were reviewed and updated as appropriate: allergies, current medications, past family history, past medical history, past social history, past surgical history and problem list.  Past Medical History  Diagnosis Date  . Hypertension   . Obesity   . Arthritis   . Allergy     RHINITIS  . Hypothyroidism   . Hypercholesteremia   . Diabetes mellitus     No Known Allergies   Review of Systems ROS reviewed and was negative other than noted in HPI or above.    Objective:   Physical Exam  General appearance: alert, no distress, WD/WN Oral cavity: MMM, no lesions Neck: supple, no lymphadenopathy, no thyromegaly, no masses Heart: RRR, normal S1, S2, no murmurs Lungs: CTA bilaterally, no wheezes, rhonchi, or rales Abdomen: +bs, soft, non tender, non distended, no masses, no hepatomegaly, no splenomegaly Pulses: 2+ symmetric, upper and lower extremities, normal cap refill   Assessment and Plan :     Encounter Diagnoses  Name Primary?  . Type II or unspecified type diabetes mellitus without mention of complication, not stated as uncontrolled Yes  . Essential hypertension, benign   . Hypothyroidism   . Hyperlipidemia   . Pre-ulcerative corn or callous   . Thickened nails    DM type II - labs today, c/t same medications, check glucose once daily.    HTN - c/t current medications.  Refills today.  Hypothyroidism - labs today  Hyperlipidemia - labs today, c/t same  medications  Pre-ulcerative callous/thickened nails - referral to podiatry

## 2012-03-15 NOTE — Telephone Encounter (Signed)
PATIENT WAS MADE AWARE OF HER FOOT APPOINTMENT ON 03/15/12 @ 934 AM. [facility]   [facility] [address] [place], [place] [phone]  June 28TH, 13 @ 900 AM  [facility]

## 2012-03-16 LAB — MICROALBUMIN / CREATININE URINE RATIO
Microalb Creat Ratio: 13.9 mg/g (ref 0.0–30.0)
Microalb, Ur: 0.72 mg/dL (ref 0.00–1.89)

## 2012-03-17 ENCOUNTER — Telehealth: Payer: Self-pay | Admitting: Family Medicine

## 2012-03-17 LAB — HEMOGLOBIN A1C: Mean Plasma Glucose: 177 mg/dL — ABNORMAL HIGH (ref <117)

## 2012-03-17 NOTE — Telephone Encounter (Signed)
PATIENT IS AWARE OF HER APPOINTMENT. [facility] June 28TH, 2013 @ 900 AM. CLS I MADE A TELEPHONE CALL WITH ALL THE INFORMATION IN IT. CLS

## 2012-03-17 NOTE — Telephone Encounter (Signed)
Message copied by Janeice Robinson on Fri Mar 17, 2012  8:35 AM ------      Message from: Aleen Campi, [name] S      Created: Wed Mar 15, 2012  9:16 PM       Referral to podiatry - callous, diabetic foot exam, nail care

## 2012-03-21 ENCOUNTER — Telehealth: Payer: Self-pay | Admitting: Family Medicine

## 2012-03-21 NOTE — Telephone Encounter (Signed)
LM

## 2012-03-22 ENCOUNTER — Encounter: Payer: Self-pay | Admitting: Family Medicine

## 2012-03-28 ENCOUNTER — Telehealth: Payer: Self-pay | Admitting: Family Medicine

## 2012-03-28 NOTE — Telephone Encounter (Signed)
Patient called and was informed of her lab results. Patient was told that Kristian Covey PA_C recommends that she adds on a medication to help with her diabetes. Patient states that she does not want to go on insulin at this time. She states that she will try over the next few months to work on it by trying to remember to take her medication everyday because some mornings she forgets. I told the patient I would let Kristian Covey PA-C aware of this. CLS

## 2012-03-29 NOTE — Telephone Encounter (Signed)
F/u in 9mo, be compliant every day with medications to better control her diabetes

## 2012-03-29 NOTE — Telephone Encounter (Signed)
I left message on the patients machine to follow up with Kristian Covey PA-C in 3 months. CLS

## 2012-09-19 ENCOUNTER — Ambulatory Visit (INDEPENDENT_AMBULATORY_CARE_PROVIDER_SITE_OTHER): Payer: Medicare Other | Admitting: Medical

## 2012-09-19 ENCOUNTER — Encounter: Payer: Self-pay | Admitting: Medical

## 2012-09-19 VITALS — BP 140/60 | HR 76 | Temp 98.3°F | Resp 16 | Wt 153.0 lb

## 2012-09-19 DIAGNOSIS — J329 Chronic sinusitis, unspecified: Secondary | ICD-10-CM

## 2012-09-19 MED ORDER — CEFUROXIME AXETIL 500 MG PO TABS: 500.0000 mg | ORAL_TABLET | Freq: Two times a day (BID) | ORAL | Status: DC

## 2012-09-19 NOTE — Progress Notes (Signed)
Subjective;  Connie [name] is a 78 y.o. female who presents for sinus pressure, sore throat, post nasal drainage, hoarse, stuffy nose, thick nasal discharge x 5-6 days, worsening.  Hx/o sinus infection about this time yearly.  Using Dayquil, Advil.  No fever, no NVD, no SOB, wheezing.  +sick contacts.  No other aggravating or relieving factors.  No other c/o.  Past Medical History  Diagnosis Date  . Hypertension   . Obesity   . Arthritis   . Allergy     RHINITIS  . Hypothyroidism   . Hypercholesteremia   . Diabetes mellitus    ROS as in subjective  Objective:   Filed Vitals:   09/19/12 0905  BP: 140/60  Pulse: 76  Temp: 98.3 F (36.8 C)  Resp: 16    General appearance: Alert, WD/WN, no distress                             Skin: warm, no rash                           Head: +frontal sinus tenderness,                            Eyes: conjunctiva normal, corneas clear, PERRLA                            Ears: pearly TMs, external ear canals normal                          Nose: septum midline, turbinates swollen, with erythema and mild purulent discharge             Mouth/throat: MMM, tongue normal, mild pharyngeal erythema                           Neck: supple, no adenopathy, no thyromegaly, nontender                          Heart: RRR, normal S1, S2, no murmurs                         Lungs: CTA bilaterally, no wheezes, rales, or rhonchi      Assessment and Plan:   Encounter Diagnosis  Name Primary?  . Sinusitis Yes   Prescription given for Ceftin.  Tylenol or Ibuprofen OTC for fever and malaise.  Discussed symptomatic relief, nasal saline, and call or return if worse or not improving in 2-3 days.

## 2012-10-16 ENCOUNTER — Other Ambulatory Visit: Payer: Self-pay | Admitting: Medical

## 2012-10-17 ENCOUNTER — Other Ambulatory Visit: Payer: Self-pay | Admitting: Medical

## 2012-10-17 NOTE — Telephone Encounter (Signed)
PATIENT NEEDS TO SCHEDULE A FOLLOW UP APPOINTMENT TO HAVE LAB WORK TO CHECK HER THYROID

## 2012-10-21 ENCOUNTER — Other Ambulatory Visit: Payer: Self-pay | Admitting: Medical

## 2012-10-23 NOTE — Telephone Encounter (Signed)
PATIENT NEEDS A OFFICE VISIT FOR A MEDICATION CHECK

## 2012-11-14 ENCOUNTER — Other Ambulatory Visit: Payer: Self-pay | Admitting: Medical

## 2012-11-23 ENCOUNTER — Ambulatory Visit (INDEPENDENT_AMBULATORY_CARE_PROVIDER_SITE_OTHER): Payer: Medicare Other | Admitting: Family Medicine

## 2012-11-23 ENCOUNTER — Encounter: Payer: Self-pay | Admitting: Family Medicine

## 2012-11-23 VITALS — BP 122/80 | HR 70 | Wt 157.0 lb

## 2012-11-23 DIAGNOSIS — I10 Essential (primary) hypertension: Secondary | ICD-10-CM

## 2012-11-23 DIAGNOSIS — E039 Hypothyroidism, unspecified: Secondary | ICD-10-CM

## 2012-11-23 DIAGNOSIS — E785 Hyperlipidemia, unspecified: Secondary | ICD-10-CM

## 2012-11-23 DIAGNOSIS — E119 Type 2 diabetes mellitus without complications: Secondary | ICD-10-CM

## 2012-11-23 LAB — POCT UA - MICROALBUMIN: Albumin/Creatinine Ratio, Urine, POC: 8.7

## 2012-11-23 MED ORDER — LEVOTHYROXINE SODIUM 125 MCG PO TABS: ORAL_TABLET | ORAL | Status: DC

## 2012-11-23 MED ORDER — BENAZEPRIL-HYDROCHLOROTHIAZIDE 20-25 MG PO TABS: 1.0000 | ORAL_TABLET | Freq: Every day | ORAL | Status: DC

## 2012-11-23 NOTE — Patient Instructions (Signed)
Continue to check your blood sugar but do it either before a meal or 2 hours after a meal.

## 2012-11-23 NOTE — Progress Notes (Signed)
  Subjective:    Patient ID: Connie [name], female    DOB: January 08, 1934, 79 y.o.   MRN: [id number]  HPI She is here for a diabetes followup. She continues on medications listed in the chart. She does not smoke or drink. Her physical activity has increased. She and her husband are now doing some physical activities together. She checks her feet regularly. She has had an eye exam within the last year. She does not check her blood sugars on a regular basis but does keep track of them.   Review of Systems     Objective:   Physical Exam Alert and in no distress. Hemoglobin A1c is 6.9.       Assessment & Plan:  Type II or unspecified type diabetes mellitus without mention of complication, not stated as uncontrolled - Plan: POCT glycosylated hemoglobin (Hb A1C), POCT UA - Microalbumin  Essential hypertension, benign  Hypothyroidism  Hyperlipidemia encouraged her to continue to take good care of herself. I will do blood work with her next visit.

## 2012-11-26 ENCOUNTER — Other Ambulatory Visit: Payer: Self-pay | Admitting: Medical

## 2012-12-17 ENCOUNTER — Other Ambulatory Visit: Payer: Self-pay | Admitting: Medical

## 2013-01-04 ENCOUNTER — Other Ambulatory Visit: Payer: Self-pay | Admitting: Medical

## 2013-02-11 ENCOUNTER — Other Ambulatory Visit: Payer: Self-pay | Admitting: Medical

## 2013-03-16 ENCOUNTER — Other Ambulatory Visit: Payer: Self-pay | Admitting: Medical

## 2013-03-26 ENCOUNTER — Other Ambulatory Visit: Payer: Self-pay | Admitting: Medical

## 2013-05-22 ENCOUNTER — Telehealth: Payer: Self-pay | Admitting: Internal Medicine

## 2013-05-22 DIAGNOSIS — E039 Hypothyroidism, unspecified: Secondary | ICD-10-CM

## 2013-05-22 MED ORDER — LEVOTHYROXINE SODIUM 125 MCG PO TABS: ORAL_TABLET | ORAL | Status: DC

## 2013-05-22 NOTE — Telephone Encounter (Signed)
Refill request for levothyroxine to [facility] [address] in [place],

## 2013-05-22 NOTE — Telephone Encounter (Signed)
SENT MED IN 

## 2013-05-23 ENCOUNTER — Encounter: Payer: Self-pay | Admitting: Family Medicine

## 2013-05-23 ENCOUNTER — Ambulatory Visit (INDEPENDENT_AMBULATORY_CARE_PROVIDER_SITE_OTHER): Payer: Medicare Other | Admitting: Family Medicine

## 2013-05-23 VITALS — BP 128/78 | HR 60 | Temp 97.7°F | Wt 154.0 lb

## 2013-05-23 DIAGNOSIS — R609 Edema, unspecified: Secondary | ICD-10-CM

## 2013-05-23 DIAGNOSIS — H6692 Otitis media, unspecified, left ear: Secondary | ICD-10-CM

## 2013-05-23 DIAGNOSIS — H6690 Otitis media, unspecified, unspecified ear: Secondary | ICD-10-CM

## 2013-05-23 MED ORDER — AMOXICILLIN 875 MG PO TABS: 875.0000 mg | ORAL_TABLET | Freq: Two times a day (BID) | ORAL | Status: DC

## 2013-05-23 NOTE — Progress Notes (Signed)
  Subjective:    Patient ID: Connie [name], female    DOB: 1934/08/30, 79 y.o.   MRN: [id number]  HPI  She has a one-week history of left ear pain, fatigue and cough. No sore throat, fever, chills or chest congestion. Over this last weekend she noted some clear drainage from the left ear as well as difficulty with dizziness.  She also complains of a several month history of left lower extremity swelling especially when she sits for long periods of time. When she gets up in the morning, both legs are the same size no chest pain, shortness of breath or leg pain. Review of Systems     Objective:   Physical Exam alert and in no distress. Tympanic membrane on the left shows poor landmarks, right is normal, canals are normal. Throat is clear. Tonsils are normal. Neck is supple without adenopathy or thyromegaly. Cardiac exam shows a regular sinus rhythm without murmurs or gallops. Lungs are clear to auscultation. Lower extremity examination does show bilateral 1+ pitting edema, left leg slightly larger than the right negative Homans sign.       Assessment & Plan:  Dependent edema  LOM (left otitis media) - Plan: amoxicillin (AMOXIL) 875 MG tablet  explained that the edema although slightly greater on the left was not big concern. Recommend physical activity, elevating her feet as much is possible. If the swelling gets more extensive, she is to return here.

## 2013-05-23 NOTE — Patient Instructions (Signed)
To call the antibiotic and return here in about 2 weeks. Keep your legs elevated as much as you can

## 2013-06-07 ENCOUNTER — Ambulatory Visit (INDEPENDENT_AMBULATORY_CARE_PROVIDER_SITE_OTHER): Payer: Medicare Other | Admitting: Family Medicine

## 2013-06-07 ENCOUNTER — Encounter: Payer: Self-pay | Admitting: Family Medicine

## 2013-06-07 VITALS — BP 140/88 | HR 75 | Wt 152.0 lb

## 2013-06-07 DIAGNOSIS — H6692 Otitis media, unspecified, left ear: Secondary | ICD-10-CM

## 2013-06-07 DIAGNOSIS — H6690 Otitis media, unspecified, unspecified ear: Secondary | ICD-10-CM

## 2013-06-07 DIAGNOSIS — R600 Localized edema: Secondary | ICD-10-CM

## 2013-06-07 DIAGNOSIS — R609 Edema, unspecified: Secondary | ICD-10-CM

## 2013-06-07 DIAGNOSIS — Z23 Encounter for immunization: Secondary | ICD-10-CM

## 2013-06-07 MED ORDER — AMOXICILLIN 875 MG PO TABS: 875.0000 mg | ORAL_TABLET | Freq: Two times a day (BID) | ORAL | Status: DC

## 2013-06-07 NOTE — Progress Notes (Signed)
  Subjective:    Patient ID: Connie [name], female    DOB: September 24, 1933, 79 y.o.   MRN: [id number]  HPI He is here for recheck. She states that she is doing slightly better with her left ear but still not quite back to normal. She also states that the swelling in her left leg is essentially unchanged. She's had no chest pain, shortness of breath, DOE. No leg pain with increased physical activity.   Review of Systems     Objective:   Physical Exam Alert and in no distress. Left tympanic membrane does show better landmarks was still not back to normal. Right TM does show some chronic changes. Lower extremities shows the left leg to be slightly larger and more edematous than the right although only slightly. Pulses are normal. Skin is normal.       Assessment & Plan:  Leg edema, left - Plan: Lower Extremity Arterial Doppler Left  LOM (left otitis media) - Plan: amoxicillin (AMOXIL) 875 MG tablet  Need for prophylactic vaccination and inoculation against influenza - Plan: Flu Vaccine QUAD 36+ mos IM  flu shot given with risks and benefits discussed. I will initially check an arterial Doppler but may need to do a venous study on her.

## 2013-06-08 ENCOUNTER — Ambulatory Visit (HOSPITAL_COMMUNITY)
Admission: RE | Admit: 2013-06-08 | Discharge: 2013-06-08 | Disposition: A | Discharge status: Home / Self Care | Payer: Medicare Other | Source: Ambulatory Visit | Attending: Family Medicine | Admitting: Family Medicine

## 2013-06-08 DIAGNOSIS — R609 Edema, unspecified: Secondary | ICD-10-CM | POA: Insufficient documentation

## 2013-06-08 DIAGNOSIS — I10 Essential (primary) hypertension: Secondary | ICD-10-CM | POA: Insufficient documentation

## 2013-06-08 DIAGNOSIS — E119 Type 2 diabetes mellitus without complications: Secondary | ICD-10-CM | POA: Insufficient documentation

## 2013-06-08 DIAGNOSIS — R600 Localized edema: Secondary | ICD-10-CM

## 2013-06-08 NOTE — Progress Notes (Signed)
VASCULAR LAB PRELIMINARY  ARTERIAL  ABI completed: Normal ABI study.    RIGHT    LEFT    PRESSURE WAVEFORM  PRESSURE WAVEFORM  BRACHIAL 175 Tri BRACHIAL 163 Tri  DP   DP    AT 187 Bi AT 195 Bi  PT 194 Tri PT 196 Tri  PER   PER    GREAT TOE  NA GREAT TOE  NA    RIGHT LEFT  ABI 1.11 1.12    Farrel Demark, RDMS, RVT  06/08/2013, 10:11 AM

## 2013-06-11 NOTE — Progress Notes (Signed)
Quick Note:  PT INFORMED VERBALIZED UNDERSTANDING ______ 

## 2013-07-08 ENCOUNTER — Other Ambulatory Visit: Payer: Self-pay | Admitting: Medical

## 2013-07-20 ENCOUNTER — Telehealth: Payer: Self-pay | Admitting: Internal Medicine

## 2013-07-20 NOTE — Telephone Encounter (Signed)
Split the metformin and Actos up separately and  call in separately

## 2013-07-20 NOTE — Telephone Encounter (Signed)
Pt called and states that her husband med is the same price so discharge that message

## 2013-07-20 NOTE — Telephone Encounter (Signed)
Pt called stating that her piogliatzone-metformin is $45.00 and her husband just has piogliatzone and gets it for $8.00 she is wanting something different that is more cheaper than $45.00. Send to [facility] in [place]

## 2013-07-21 ENCOUNTER — Other Ambulatory Visit: Payer: Self-pay | Admitting: Medical

## 2013-07-23 ENCOUNTER — Ambulatory Visit (INDEPENDENT_AMBULATORY_CARE_PROVIDER_SITE_OTHER): Payer: Medicare Other | Admitting: Family Medicine

## 2013-07-23 VITALS — BP 140/80 | HR 76 | Temp 98.2°F | Wt 159.0 lb

## 2013-07-23 DIAGNOSIS — K219 Gastro-esophageal reflux disease without esophagitis: Secondary | ICD-10-CM

## 2013-07-23 DIAGNOSIS — J029 Acute pharyngitis, unspecified: Secondary | ICD-10-CM

## 2013-07-23 NOTE — Patient Instructions (Signed)
Take 2 Prilosec about a half an hour before bedtime for the next week or so. Let me know how it does

## 2013-07-23 NOTE — Progress Notes (Signed)
  Subjective:    Patient ID: Connie [name], female    DOB: May 04, 1934, 79 y.o.   MRN: [id number]  HPI She has a one-week history of intermittent sore throat and hacking cough especially at night. No fever or chills, earache. She also complains of slight hoarse voice.  Review of Systems     Objective:   Physical Exam alert and in no distress. Tympanic membranes and canals are normal. Throat is clear. Tonsils are normal. Neck is supple without adenopathy or thyromegaly. Cardiac exam shows a regular sinus rhythm without murmurs or gallops. Lungs are clear to auscultation. Strep  Screen is negative       Assessment & Plan:  Sore throat - Plan: POCT rapid strep A  GERD (gastroesophageal reflux disease)  her symptoms are suggestive of reflux and she is having coughing and difficulty mainly at night and when she wakes up in the morning. I will have her try Prilosec to see if this will help.

## 2013-07-29 ENCOUNTER — Other Ambulatory Visit: Payer: Self-pay | Admitting: Medical

## 2013-07-30 ENCOUNTER — Telehealth: Payer: Self-pay | Admitting: Family Medicine

## 2013-07-30 DIAGNOSIS — H6692 Otitis media, unspecified, left ear: Secondary | ICD-10-CM

## 2013-07-30 MED ORDER — AMOXICILLIN 875 MG PO TABS: 875.0000 mg | ORAL_TABLET | Freq: Two times a day (BID) | ORAL | Status: DC

## 2013-07-30 NOTE — Telephone Encounter (Signed)
Let her I called an antibiotic in.

## 2013-07-30 NOTE — Telephone Encounter (Signed)
Pt said you wanted her to call you and let you know if she was better.  She is not better, she is the same and needs an antibiotic to get rid of the stuff.  [facility] [place].  Also she states the only thing the Prilosec is doing is keeping her up at night.  It won't let her sleep.

## 2013-07-31 NOTE — Telephone Encounter (Signed)
CALLED PT TO INFORM HER ANTIBIOTIC CALLED IN AND SHE VERBALIZED UNDERSTANDING

## 2013-08-27 ENCOUNTER — Other Ambulatory Visit: Payer: Self-pay | Admitting: Medical

## 2013-09-01 ENCOUNTER — Other Ambulatory Visit: Payer: Self-pay | Admitting: Medical

## 2013-09-10 ENCOUNTER — Other Ambulatory Visit: Payer: Self-pay | Admitting: Medical

## 2013-10-01 ENCOUNTER — Other Ambulatory Visit: Payer: Self-pay | Admitting: Medical

## 2013-10-08 ENCOUNTER — Telehealth: Payer: Self-pay | Admitting: Family Medicine

## 2013-10-08 DIAGNOSIS — I10 Essential (primary) hypertension: Secondary | ICD-10-CM

## 2013-10-08 DIAGNOSIS — E039 Hypothyroidism, unspecified: Secondary | ICD-10-CM

## 2013-10-08 MED ORDER — LEVOTHYROXINE SODIUM 125 MCG PO TABS: ORAL_TABLET | ORAL | Status: DC

## 2013-10-08 MED ORDER — POTASSIUM CHLORIDE CRYS ER 10 MEQ PO TBCR: EXTENDED_RELEASE_TABLET | ORAL | Status: DC

## 2013-10-08 MED ORDER — FUROSEMIDE 20 MG PO TABS: ORAL_TABLET | ORAL | Status: DC

## 2013-10-08 MED ORDER — BENAZEPRIL-HYDROCHLOROTHIAZIDE 20-25 MG PO TABS: 1.0000 | ORAL_TABLET | Freq: Every day | ORAL | Status: DC

## 2013-10-08 MED ORDER — AMLODIPINE BESYLATE 10 MG PO TABS: ORAL_TABLET | ORAL | Status: DC

## 2013-10-08 MED ORDER — CLONIDINE HCL 0.1 MG PO TABS: ORAL_TABLET | ORAL | Status: DC

## 2013-10-08 MED ORDER — ATORVASTATIN CALCIUM 40 MG PO TABS: ORAL_TABLET | ORAL | Status: DC

## 2013-10-08 NOTE — Telephone Encounter (Signed)
Also for levothyrozxine, furosemide, klor-con 10

## 2013-10-08 NOTE — Telephone Encounter (Signed)
Refill request for atorvastatin, benazepril-hctz, clonidine to [phone]

## 2013-10-09 ENCOUNTER — Telehealth: Payer: Self-pay | Admitting: Internal Medicine

## 2013-10-09 MED ORDER — PIOGLITAZONE HCL 30 MG PO TABS: 30.0000 mg | ORAL_TABLET | Freq: Every day | ORAL | Status: DC

## 2013-10-09 MED ORDER — METFORMIN HCL 850 MG PO TABS: 850.0000 mg | ORAL_TABLET | Freq: Two times a day (BID) | ORAL | Status: DC

## 2013-10-09 NOTE — Telephone Encounter (Signed)
Pt states they changed insurance to [facility] and should get RX thru [facility] but [facility] does not carry Pioglitazone Metformin15-850 MG at all. What else can pt take? Pt only has 3 pills left, so may need to get this at local pharmacy [facility]).

## 2013-10-09 NOTE — Telephone Encounter (Signed)
LMOM with information, informed to call if any questions or concerns

## 2013-10-09 NOTE — Telephone Encounter (Signed)
Let her know that I called the medicine into [facility]. It is the same medicine but we divided it up instead of giving it is one pill. If she misses several days it should not matter in the long run.

## 2013-10-29 ENCOUNTER — Ambulatory Visit (INDEPENDENT_AMBULATORY_CARE_PROVIDER_SITE_OTHER): Payer: Medicare PPO | Admitting: Family Medicine

## 2013-10-29 ENCOUNTER — Encounter: Payer: Self-pay | Admitting: Family Medicine

## 2013-10-29 VITALS — BP 128/80 | HR 68 | Temp 98.1°F | Wt 154.0 lb

## 2013-10-29 DIAGNOSIS — J209 Acute bronchitis, unspecified: Secondary | ICD-10-CM

## 2013-10-29 DIAGNOSIS — J019 Acute sinusitis, unspecified: Secondary | ICD-10-CM

## 2013-10-29 MED ORDER — AMOXICILLIN 875 MG PO TABS: 875.0000 mg | ORAL_TABLET | Freq: Two times a day (BID) | ORAL | Status: DC

## 2013-10-29 NOTE — Patient Instructions (Signed)
Take all the antibiotic and if not totally back to normal when you finish give me a call. Take Tylenol for the aches and pains and you can add Aleve to that if you need to

## 2013-10-29 NOTE — Progress Notes (Signed)
   Subjective:    Patient ID: Connie [name], female    DOB: May 21, 1934, 80 y.o.   MRN: [id number]  HPI She has a 6 day history nasal congestion and rhinorrhea,sore throat ,cough,chills,cough,malaise. No earache, nausea, vomiting or abdominal discomfort. She does not smoke and has no allergies She continues on her routine medications. They were reviewed.  Review of Systems     Objective:   Physical Exam alert and in no distress. Tympanic membranes and canals are normal. Throat is clear. Tonsils are normal. Neck is supple without adenopathy or thyromegaly. Cardiac exam shows a regular sinus rhythm without murmurs or gallops. Lungs are clear to auscultation. Nasal mucosa is red and slightly swollen with some tenderness over sinuses.         Assessment & Plan:  Acute bronchitis - Plan: amoxicillin (AMOXIL) 875 MG tablet  Acute sinusitis - Plan: amoxicillin (AMOXIL) 875 MG tablet  Encouraged her to call me if not entirely back to normal when she finishes the antibiotic.

## 2013-11-09 ENCOUNTER — Telehealth: Payer: Self-pay | Admitting: Family Medicine

## 2013-11-09 DIAGNOSIS — J019 Acute sinusitis, unspecified: Secondary | ICD-10-CM

## 2013-11-09 DIAGNOSIS — J209 Acute bronchitis, unspecified: Secondary | ICD-10-CM

## 2013-11-09 MED ORDER — AMOXICILLIN 875 MG PO TABS: 875.0000 mg | ORAL_TABLET | Freq: Two times a day (BID) | ORAL | Status: DC

## 2013-11-09 NOTE — Telephone Encounter (Signed)
Pt states her drainage is clear and she has finished all the antibiotic. She still has  A runny nose along with drainage but the drainage has no color, she states she still feels about the same since she has stopped taking the antibiotic, she hasn't gotten worse. She states she has been coughing up mucous from the drainage but what she is coughing up is clear too. Pt states she would like another round of antibiotic if it will help get rid of the runny nose and drainage.

## 2013-11-09 NOTE — Telephone Encounter (Signed)
Advise pt--I'm sending refill of her antibiotic.  Advise her she can also use an antihistamine (claritin, zyrtec or coricidin HBP) if needed to help with the drainage

## 2013-11-09 NOTE — Telephone Encounter (Signed)
It looks like she was treated with amox earlier this month.  Please call pt and find out if she is still having any discolored mucus, or if the drainage is all white/clear.  Did she get any improvement from the antibiotics, and is she worse since stopping it?  Please try and get a little more detailed info so I can determine if she needs another course of antibiotics (repeat of same one, vs changing it)

## 2013-11-09 NOTE — Telephone Encounter (Signed)
Pt.notified

## 2014-01-28 ENCOUNTER — Telehealth: Payer: Self-pay | Admitting: Internal Medicine

## 2014-01-28 NOTE — Telephone Encounter (Signed)
Faxed over. Will call pt when we get the comfirmation back

## 2014-01-28 NOTE — Telephone Encounter (Signed)
Pt is about to schedule an appt with Dr. Raelyn Ensign in [place] for an eye appt. Pt has humana gold and requires a referral. Please send in referral form to [facility]- Dr. Raelyn Ensign in [place], [place]- [id number], Phone # [phone], fax # [phone].

## 2014-01-30 NOTE — Telephone Encounter (Signed)
Faxed over to Dr. Gilford Rile the approval

## 2014-01-30 NOTE — Telephone Encounter (Signed)
Pt notified that Mcarthur Rossetti was approved for 6 visits through 07/29/14 Auth# [id number]

## 2014-03-21 LAB — HM DIABETES EYE EXAM

## 2014-03-25 ENCOUNTER — Encounter: Payer: Self-pay | Admitting: Internal Medicine

## 2014-04-08 ENCOUNTER — Other Ambulatory Visit: Payer: Self-pay

## 2014-04-08 ENCOUNTER — Other Ambulatory Visit: Payer: Self-pay | Admitting: Family Medicine

## 2014-04-08 MED ORDER — METFORMIN HCL 850 MG PO TABS: 850.0000 mg | ORAL_TABLET | Freq: Two times a day (BID) | ORAL | Status: DC

## 2014-06-24 ENCOUNTER — Other Ambulatory Visit (INDEPENDENT_AMBULATORY_CARE_PROVIDER_SITE_OTHER): Payer: Medicare PPO

## 2014-06-24 DIAGNOSIS — Z23 Encounter for immunization: Secondary | ICD-10-CM

## 2014-07-17 ENCOUNTER — Telehealth: Payer: Self-pay | Admitting: Family Medicine

## 2014-07-18 ENCOUNTER — Other Ambulatory Visit: Payer: Self-pay

## 2014-07-18 DIAGNOSIS — I10 Essential (primary) hypertension: Secondary | ICD-10-CM

## 2014-07-18 MED ORDER — PIOGLITAZONE HCL 30 MG PO TABS: 30.0000 mg | ORAL_TABLET | Freq: Every day | ORAL | Status: DC

## 2014-07-18 MED ORDER — FUROSEMIDE 20 MG PO TABS: ORAL_TABLET | ORAL | Status: DC

## 2014-07-18 MED ORDER — BENAZEPRIL-HYDROCHLOROTHIAZIDE 20-25 MG PO TABS: 1.0000 | ORAL_TABLET | Freq: Every day | ORAL | Status: DC

## 2014-07-18 MED ORDER — LEVOTHYROXINE SODIUM 125 MCG PO TABS: ORAL_TABLET | ORAL | Status: DC

## 2014-07-18 MED ORDER — AMLODIPINE BESYLATE 10 MG PO TABS: ORAL_TABLET | ORAL | Status: DC

## 2014-07-18 NOTE — Telephone Encounter (Signed)
done

## 2014-07-25 ENCOUNTER — Other Ambulatory Visit: Payer: Self-pay

## 2014-07-25 ENCOUNTER — Encounter: Payer: Self-pay | Admitting: Family Medicine

## 2014-07-25 ENCOUNTER — Ambulatory Visit (INDEPENDENT_AMBULATORY_CARE_PROVIDER_SITE_OTHER): Payer: Medicare PPO | Admitting: Family Medicine

## 2014-07-25 VITALS — BP 120/70 | HR 68 | Ht <= 58 in | Wt 165.0 lb

## 2014-07-25 DIAGNOSIS — Z23 Encounter for immunization: Secondary | ICD-10-CM

## 2014-07-25 DIAGNOSIS — E1159 Type 2 diabetes mellitus with other circulatory complications: Secondary | ICD-10-CM

## 2014-07-25 DIAGNOSIS — I10 Essential (primary) hypertension: Secondary | ICD-10-CM

## 2014-07-25 DIAGNOSIS — E669 Obesity, unspecified: Secondary | ICD-10-CM

## 2014-07-25 DIAGNOSIS — E785 Hyperlipidemia, unspecified: Secondary | ICD-10-CM

## 2014-07-25 DIAGNOSIS — M1990 Unspecified osteoarthritis, unspecified site: Secondary | ICD-10-CM

## 2014-07-25 DIAGNOSIS — E1169 Type 2 diabetes mellitus with other specified complication: Secondary | ICD-10-CM

## 2014-07-25 DIAGNOSIS — E1136 Type 2 diabetes mellitus with diabetic cataract: Secondary | ICD-10-CM

## 2014-07-25 DIAGNOSIS — E66811 Obesity, class 1: Secondary | ICD-10-CM

## 2014-07-25 LAB — POCT GLYCOSYLATED HEMOGLOBIN (HGB A1C): Hemoglobin A1C: 6.9

## 2014-07-25 LAB — LIPID PANEL
Cholesterol: 162 mg/dL (ref 0–200)
HDL: 52 mg/dL (ref >39)
LDL CALC: 80 mg/dL (ref 0–99)
Total CHOL/HDL Ratio: 3.1 Ratio
Triglycerides: 152 mg/dL — ABNORMAL HIGH (ref <150)
VLDL: 30 mg/dL (ref 0–40)

## 2014-07-25 LAB — CBC WITH DIFFERENTIAL/PLATELET
BASOS PCT: 1 % (ref 0–1)
Basophils Absolute: 0.1 10*3/uL (ref 0.0–0.1)
Eosinophils Absolute: 0.2 10*3/uL (ref 0.0–0.7)
Eosinophils Relative: 4 % (ref 0–5)
HEMATOCRIT: 29.5 % — AB (ref 36.0–46.0)
HEMOGLOBIN: 9.3 g/dL — AB (ref 12.0–15.0)
LYMPHS ABS: 0.8 10*3/uL (ref 0.7–4.0)
Lymphocytes Relative: 16 % (ref 12–46)
MCH: 25.3 pg — ABNORMAL LOW (ref 26.0–34.0)
MCHC: 31.5 g/dL (ref 30.0–36.0)
MCV: 80.4 fL (ref 78.0–100.0)
MONO ABS: 0.4 10*3/uL (ref 0.1–1.0)
Monocytes Relative: 8 % (ref 3–12)
NEUTROS ABS: 3.7 10*3/uL (ref 1.7–7.7)
NEUTROS PCT: 71 % (ref 43–77)
Platelets: 366 10*3/uL (ref 150–400)
RBC: 3.67 MIL/uL — AB (ref 3.87–5.11)
RDW: 14.3 % (ref 11.5–15.5)
WBC: 5.2 10*3/uL (ref 4.0–10.5)

## 2014-07-25 LAB — COMPREHENSIVE METABOLIC PANEL
ALK PHOS: 60 U/L (ref 39–117)
ALT: <8 U/L (ref 0–35)
AST: 17 U/L (ref 0–37)
Albumin: 3.9 g/dL (ref 3.5–5.2)
BUN: 36 mg/dL — ABNORMAL HIGH (ref 6–23)
CALCIUM: 9.1 mg/dL (ref 8.4–10.5)
CO2: 27 mEq/L (ref 19–32)
Chloride: 103 mEq/L (ref 96–112)
Creat: 1.35 mg/dL — ABNORMAL HIGH (ref 0.50–1.10)
Glucose, Bld: 123 mg/dL — ABNORMAL HIGH (ref 70–99)
Potassium: 4.4 mEq/L (ref 3.5–5.3)
SODIUM: 138 meq/L (ref 135–145)
TOTAL PROTEIN: 6.6 g/dL (ref 6.0–8.3)
Total Bilirubin: 0.6 mg/dL (ref 0.2–1.2)

## 2014-07-25 MED ORDER — GLUCOSE BLOOD VI STRP: ORAL_STRIP | Status: DC

## 2014-07-25 MED ORDER — ONETOUCH DELICA LANCETS FINE MISC: 1.0000 | Freq: Every day | Status: DC

## 2014-07-25 MED ORDER — ATORVASTATIN CALCIUM 40 MG PO TABS: ORAL_TABLET | ORAL | Status: DC

## 2014-07-25 MED ORDER — CLONIDINE HCL 0.1 MG PO TABS: ORAL_TABLET | ORAL | Status: DC

## 2014-07-25 MED ORDER — METFORMIN HCL 850 MG PO TABS
850.0000 mg | ORAL_TABLET | Freq: Two times a day (BID) | ORAL | Status: DC
Start: 2014-07-25 — End: 2015-04-28

## 2014-07-25 NOTE — Progress Notes (Signed)
Subjective:    Connie [name] is a 80 y.o. female who presents for follow-up of Type 2 diabetes mellitus.    Home blood sugar records: Patient has not been testing due to meter not working  Gave patient new meter verio onetouch             Current symptoms/problems none Daily foot checks:   Any foot concerns: none Last eye exam:  03/21/14   Medication compliance:good Current diet: none Current exercise: not as much lately her arthritis keeps her from being more physically active. Known diabetic complications: none Cardiovascular risk factors: advanced age (older than 54 for men, 85 for women), diabetes mellitus, dyslipidemia, hypertension, obesity (BMI >= 30 kg/m2) and sedentary lifestyle   The following portions of the patient's history were reviewed and updated as appropriate: allergies, current medications, past family history, past medical history, past social history and problem list.  ROS as in subjective above    Objective:    Ht 4\' 9"  (1.448 m)  Wt 165 lb (74.844 kg)  BMI 35.70 kg/m2  There were no vitals filed for this visit.  General appearence: alert, no distress, WD/WN Foot exam is recorded  Lab Review Lab Results  Component Value Date   HGBA1C 6.9 11/23/2012   Lab Results  Component Value Date   CHOL 149 03/15/2012   HDL 43 03/15/2012   LDLCALC 69 03/15/2012   TRIG 184* 03/15/2012   CHOLHDL 3.5 03/15/2012   Lab Results  Component Value Date   MICROALBUR 0.72 03/15/2012     Chemistry      Component Value Date/Time   NA 143 03/15/2012 0933   K 4.0 03/15/2012 0933   CL 103 03/15/2012 0933   CO2 29 03/15/2012 0933   BUN 43* 03/15/2012 0933   CREATININE 1.20* 03/15/2012 0933      Component Value Date/Time   CALCIUM 9.4 03/15/2012 0933   ALKPHOS 60 03/15/2012 0933   AST 17 03/15/2012 0933   ALT <8 03/15/2012 0933   BILITOT 0.8 03/15/2012 0933        Chemistry      Component Value Date/Time   NA 143 03/15/2012 0933   K 4.0  03/15/2012 0933   CL 103 03/15/2012 0933   CO2 29 03/15/2012 0933   BUN 43* 03/15/2012 0933   CREATININE 1.20* 03/15/2012 0933      Component Value Date/Time   CALCIUM 9.4 03/15/2012 0933   ALKPHOS 60 03/15/2012 0933   AST 17 03/15/2012 0933   ALT <8 03/15/2012 0933   BILITOT 0.8 03/15/2012 0933         Assessment:  Type 2 diabetes mellitus with diabetic cataract - Plan: POCT glycosylated hemoglobin (Hb A1C), CBC with Differential, Comprehensive metabolic panel, Lipid panel, metFORMIN (GLUCOPHAGE) 850 MG tablet, HM DIABETES FOOT EXAM, HM DIABETES FOOT EXAM, CANCELED: POCT UA - Microalbumin  Need for prophylactic vaccination against Streptococcus pneumoniae (pneumococcus) - Plan: Pneumococcal conjugate vaccine 13-valent  Hypertension associated with diabetes - Plan: CBC with Differential, Comprehensive metabolic panel, Lipid panel, cloNIDine (CATAPRES) 0.1 MG tablet  Hyperlipidemia associated with type 2 diabetes mellitus - Plan: atorvastatin (LIPITOR) 40 MG tablet  Obesity (BMI 30.0-34.9) - Plan: CBC with Differential, Comprehensive metabolic panel, Lipid panel  Arthritis        Plan:    1.  Rx changes: none 2.  Education: Reviewed 'ABCs' of diabetes management (respective goals in parentheses):  A1C (<7), blood pressure (<130/80), and cholesterol (LDL <100). 3.  Compliance at present  is estimated to be good. Efforts to improve compliance (if necessary) will be directed at increased exercise. 4. Follow up: 4 months

## 2014-07-29 ENCOUNTER — Ambulatory Visit (INDEPENDENT_AMBULATORY_CARE_PROVIDER_SITE_OTHER): Payer: Medicare PPO | Admitting: Family Medicine

## 2014-07-29 DIAGNOSIS — D649 Anemia, unspecified: Secondary | ICD-10-CM

## 2014-07-29 NOTE — Progress Notes (Signed)
   Subjective:    Patient ID: Connie [name], female    DOB: 03-Dec-1933, 80 y.o.   MRN: [id number]  HPI She is here for recheck concerning the low hemoglobin. She has no abdominal pain, weight change, nausea, vomiting,early satiety, change in stool. Her last colonoscopy apparently was 38.   Review of Systems     Objective:   Physical Exam Alert and in no distress otherwise not examined       Assessment & Plan:  Anemia, unspecified anemia type - Plan: Ambulatory referral to Gastroenterology

## 2014-08-12 ENCOUNTER — Telehealth: Payer: Self-pay | Admitting: Internal Medicine

## 2014-08-12 NOTE — Telephone Encounter (Signed)
Pt states that she is having a colonoscopy done on dec 3rd. And wants to know if she has any meds that she needs to stop 5-7 days prior. She does have a sheet that tells her to stop multiple vitamins, aspirin, but she didn't know if you could look at her med list and tell her what else she needs to stop.

## 2014-08-12 NOTE — Telephone Encounter (Signed)
Let her know that none of the other medications are of concern

## 2014-08-13 NOTE — Telephone Encounter (Signed)
Patient informed and verbalized understanding

## 2014-08-22 LAB — HM COLONOSCOPY

## 2014-08-27 ENCOUNTER — Encounter: Payer: Self-pay | Admitting: Family Medicine

## 2014-08-28 ENCOUNTER — Other Ambulatory Visit: Payer: Self-pay | Admitting: Gastroenterology

## 2014-08-28 ENCOUNTER — Encounter: Payer: Self-pay | Admitting: Internal Medicine

## 2014-08-30 ENCOUNTER — Encounter (HOSPITAL_COMMUNITY): Payer: Self-pay

## 2014-08-30 ENCOUNTER — Ambulatory Visit (HOSPITAL_COMMUNITY)
Admission: RE | Admit: 2014-08-30 | Discharge: 2014-08-30 | Disposition: A | Discharge status: Home / Self Care | Payer: Commercial Managed Care - HMO | Source: Ambulatory Visit | Attending: Gastroenterology | Admitting: Gastroenterology

## 2014-08-30 ENCOUNTER — Encounter: Payer: Self-pay | Admitting: Family Medicine

## 2014-08-30 ENCOUNTER — Encounter (HOSPITAL_COMMUNITY)
Admission: RE | Disposition: A | Discharge status: Home / Self Care | Payer: Self-pay | Source: Ambulatory Visit | Attending: Gastroenterology

## 2014-08-30 DIAGNOSIS — K5730 Diverticulosis of large intestine without perforation or abscess without bleeding: Secondary | ICD-10-CM | POA: Insufficient documentation

## 2014-08-30 DIAGNOSIS — M1990 Unspecified osteoarthritis, unspecified site: Secondary | ICD-10-CM | POA: Diagnosis not present

## 2014-08-30 DIAGNOSIS — E669 Obesity, unspecified: Secondary | ICD-10-CM | POA: Diagnosis not present

## 2014-08-30 DIAGNOSIS — E039 Hypothyroidism, unspecified: Secondary | ICD-10-CM | POA: Diagnosis not present

## 2014-08-30 DIAGNOSIS — E780 Pure hypercholesterolemia: Secondary | ICD-10-CM | POA: Insufficient documentation

## 2014-08-30 DIAGNOSIS — E119 Type 2 diabetes mellitus without complications: Secondary | ICD-10-CM | POA: Diagnosis not present

## 2014-08-30 DIAGNOSIS — D509 Iron deficiency anemia, unspecified: Secondary | ICD-10-CM | POA: Diagnosis present

## 2014-08-30 DIAGNOSIS — I10 Essential (primary) hypertension: Secondary | ICD-10-CM | POA: Diagnosis not present

## 2014-08-30 HISTORY — PX: COLONOSCOPY: SHX5424

## 2014-08-30 HISTORY — PX: HOT HEMOSTASIS: SHX5433

## 2014-08-30 LAB — GLUCOSE, CAPILLARY: Glucose-Capillary: 152 mg/dL — ABNORMAL HIGH (ref 70–99)

## 2014-08-30 SURGERY — COLONOSCOPY
Anesthesia: Moderate Sedation

## 2014-08-30 MED ORDER — MIDAZOLAM HCL 5 MG/5ML IJ SOLN
INTRAMUSCULAR | Status: DC | PRN
  Administered 2014-08-30 (×2): 2 mg via INTRAVENOUS
  Administered 2014-08-30 (×2): 1 mg via INTRAVENOUS

## 2014-08-30 MED ORDER — MIDAZOLAM HCL 10 MG/2ML IJ SOLN
INTRAMUSCULAR | Status: AC
  Filled 2014-08-30: qty 2

## 2014-08-30 MED ORDER — SODIUM CHLORIDE 0.9 % IV SOLN
INTRAVENOUS | Status: DC
  Administered 2014-08-30: 500 mL via INTRAVENOUS

## 2014-08-30 MED ORDER — FENTANYL CITRATE 0.05 MG/ML IJ SOLN
INTRAMUSCULAR | Status: AC
  Filled 2014-08-30: qty 2

## 2014-08-30 MED ORDER — FENTANYL CITRATE 0.05 MG/ML IJ SOLN
INTRAMUSCULAR | Status: DC | PRN
  Administered 2014-08-30 (×3): 25 ug via INTRAVENOUS

## 2014-08-30 NOTE — H&P (Signed)
  Connie [name] HPI: 80 year old female identified to have a hepatic flexure AVM for the office colonoscopy.  The work up was pursued for an iron deficiency anemia.  She is here today for APC of the AVM.  Past Medical History  Diagnosis Date  . Hypertension   . Obesity   . Arthritis   . Allergy     RHINITIS  . Hypothyroidism   . Hypercholesteremia   . Diabetes mellitus     Past Surgical History  Procedure Laterality Date  . Total thyroidectomy    . Fracture surgery      RIGHT SHOULDER   . Joint replacement      RIGHT KNEE & LEFT KNEE    No family history on file.  Social History:  reports that she has never smoked. She has never used smokeless tobacco. She reports that she does not drink alcohol or use illicit drugs.  Allergies: No Known Allergies  Medications: Scheduled: Continuous:  No results found for this or any previous visit (from the past 24 hour(s)).   No results found.  ROS:  As stated above in the HPI otherwise negative.  There were no vitals taken for this visit.    PE: Gen: NAD, Alert and Oriented HEENT:  [place]/AT, EOMI Neck: Supple, no LAD Lungs: CTA Bilaterally CV: RRR without M/G/R ABM: Soft, NTND, +BS Ext: No C/C/E  Assessment/Plan: 1) Hepatic flexure AVM. 2) Iron Deficiency Anemia.  Plan: 1) Colonoscopy with APC.  Willmer Fellers D 08/30/2014, 9:36 AM

## 2014-08-30 NOTE — Op Note (Signed)
[facility] [place], [address]   COLONOSCOPY PROCEDURE REPORT  PATIENT: Connie [name], Connie [name]  MR#: [id number] BIRTHDATE: 13-Nov-1933 , 47  yrs. old GENDER: female ENDOSCOPIST: Carol Ada, MD REFERRED BY: PROCEDURE DATE:  09-11-14 PROCEDURE:   Colonoscopy with control of bleeding ASA CLASS:   Class III INDICATIONS: Hepatic flexure AVM MEDICATIONS: Versed 6 mg IV and Fentanyl 75 mcg IV  DESCRIPTION OF PROCEDURE:   After the risks and benefits and of the procedure were explained, informed consent was obtained.  revealed no abnormalities of the rectum.    The Pentax Colonoscope [id number] endoscope was introduced through the anus and advanced to the cecum, which was identified by both the appendix and ileocecal valve .  The quality of the prep was excellent. .  The instrument was then slowly withdrawn as the colon was fully examined.   FINDINGS: A possible atypical appearing AVM was ablated with APC in the cecum.  At the hepatic flexure the moderately-sized AVM was again identified.  The lesion was ablated wtih APC, however, with prolonged observation the cautery site continued to ooze.  Two hemoclips were secured across the lesion, which arrested the bleeding.  In the sigmoid colon a few scattered diverticula were found.     Retroflexed views revealed no abnormalities.     The scope was then withdrawn from the patient and the procedure completed. WITHDRAWAL TIME: 13 minutes  COMPLICATIONS: There were no immediate complications. ENDOSCOPIC IMPRESSION: 1) S/p ablation of the hepatic flexure AVM. 2) Sigmoid diverticula. RECOMMENDATIONS: 1) Follow up in the office in 2 months with a recheck of the CBC and iron studies. 2) Iron supplementation. REPEAT EXAM: cc: _______________________________ eSignedCarol Ada, MD 09/11/14 11:23 AM  CPT CODES: ICD CODES:  The ICD and CPT codes recommended by this software are interpretations from  the data that the clinical staff has captured with the software.  The verification of the translation of this report to the ICD and CPT codes and modifiers is the sole responsibility of the health care institution and practicing physician where this report was generated.  [employer] will not be held responsible for the validity of the ICD and CPT codes included on this report.  AMA assumes no liability for data contained or not contained herein. CPT is a Designer, television/film set of the [employer].

## 2014-09-02 ENCOUNTER — Encounter (HOSPITAL_COMMUNITY): Payer: Self-pay | Admitting: Gastroenterology

## 2014-09-04 ENCOUNTER — Telehealth: Payer: Self-pay | Admitting: Family Medicine

## 2014-09-04 NOTE — Telephone Encounter (Signed)
Pt had Colonoscopy on 08/22/14 & 08/30/14. Please reviiew those. She started taking Vitron-C on Statuday. Is this what Dr Redmond School feel pt should be taking?

## 2014-09-05 NOTE — Telephone Encounter (Signed)
I looked med up and it is iron so i called patient and informed her that med was okay she verbalized understanding

## 2014-09-05 NOTE — Telephone Encounter (Signed)
Make sure this has iron in it and if it does that spine

## 2014-09-11 ENCOUNTER — Ambulatory Visit (INDEPENDENT_AMBULATORY_CARE_PROVIDER_SITE_OTHER): Payer: Medicare PPO | Admitting: Family Medicine

## 2014-09-11 VITALS — BP 148/58 | HR 84 | Wt 160.0 lb

## 2014-09-11 DIAGNOSIS — L309 Dermatitis, unspecified: Secondary | ICD-10-CM

## 2014-09-11 MED ORDER — TRIAMCINOLONE ACETONIDE 0.1 % EX CREA: 1.0000 "application " | TOPICAL_CREAM | Freq: Two times a day (BID) | CUTANEOUS | Status: DC

## 2014-09-11 NOTE — Progress Notes (Signed)
   Subjective:    Patient ID: Connie [name], female    DOB: 1934-07-18, 80 y.o.   MRN: [id number]  HPI She is here for evaluation of a rash she has had on the right side of her neck for the last several weeks. She has been using calamine lotion.   Review of Systems     Objective:   Physical Exam Dry slightly erythematous thickened area noted on the right lateral lower neck area. No adenopathy is noted.       Assessment & Plan:  Dermatitis - Plan: triamcinolone cream (KENALOG) 0.1 %, DISCONTINUED: triamcinolone cream (KENALOG) 0.1 %  Also recommend cool compresses.

## 2014-09-18 ENCOUNTER — Other Ambulatory Visit: Payer: Medicare PPO

## 2014-09-18 DIAGNOSIS — K5521 Angiodysplasia of colon with hemorrhage: Secondary | ICD-10-CM

## 2014-09-18 DIAGNOSIS — D500 Iron deficiency anemia secondary to blood loss (chronic): Secondary | ICD-10-CM

## 2014-09-18 LAB — CBC WITH DIFFERENTIAL/PLATELET
Basophils Absolute: 0.1 10*3/uL (ref 0.0–0.1)
Basophils Relative: 1 % (ref 0–1)
EOS ABS: 0.2 10*3/uL (ref 0.0–0.7)
Eosinophils Relative: 3 % (ref 0–5)
HCT: 30.2 % — ABNORMAL LOW (ref 36.0–46.0)
Hemoglobin: 9.7 g/dL — ABNORMAL LOW (ref 12.0–15.0)
Lymphocytes Relative: 21 % (ref 12–46)
Lymphs Abs: 1.3 10*3/uL (ref 0.7–4.0)
MCH: 24.6 pg — AB (ref 26.0–34.0)
MCHC: 32.1 g/dL (ref 30.0–36.0)
MCV: 76.5 fL — ABNORMAL LOW (ref 78.0–100.0)
MONOS PCT: 15 % — AB (ref 3–12)
MPV: 9.6 fL (ref 8.6–12.4)
Monocytes Absolute: 0.9 10*3/uL (ref 0.1–1.0)
Neutro Abs: 3.7 10*3/uL (ref 1.7–7.7)
Neutrophils Relative %: 60 % (ref 43–77)
PLATELETS: 397 10*3/uL (ref 150–400)
RBC: 3.95 MIL/uL (ref 3.87–5.11)
RDW: 16.3 % — AB (ref 11.5–15.5)
WBC: 6.2 10*3/uL (ref 4.0–10.5)

## 2014-09-18 LAB — IRON AND TIBC
%SAT: 8 % — ABNORMAL LOW (ref 20–55)
IRON: 32 ug/dL — AB (ref 42–145)
TIBC: 381 ug/dL (ref 250–470)
UIBC: 349 ug/dL (ref 125–400)

## 2014-09-19 LAB — FERRITIN: Ferritin: 23 ng/mL (ref 10–291)

## 2014-10-07 DIAGNOSIS — H3531 Nonexudative age-related macular degeneration: Secondary | ICD-10-CM | POA: Diagnosis not present

## 2014-10-07 DIAGNOSIS — I10 Essential (primary) hypertension: Secondary | ICD-10-CM | POA: Diagnosis not present

## 2014-10-25 ENCOUNTER — Telehealth: Payer: Self-pay | Admitting: Family Medicine

## 2014-10-25 DIAGNOSIS — I10 Essential (primary) hypertension: Secondary | ICD-10-CM

## 2014-10-25 DIAGNOSIS — E785 Hyperlipidemia, unspecified: Principal | ICD-10-CM

## 2014-10-25 DIAGNOSIS — E1169 Type 2 diabetes mellitus with other specified complication: Secondary | ICD-10-CM

## 2014-10-25 NOTE — Telephone Encounter (Signed)
Pt called and needs refills on Lipitor, lasix, potassium, actos, levothyroxine and lotensin hct sent to [facility] mail order pharmacy.

## 2014-10-28 MED ORDER — ATORVASTATIN CALCIUM 40 MG PO TABS: ORAL_TABLET | ORAL | Status: DC

## 2014-10-28 MED ORDER — LEVOTHYROXINE SODIUM 125 MCG PO TABS: ORAL_TABLET | ORAL | Status: DC

## 2014-10-28 MED ORDER — BENAZEPRIL-HYDROCHLOROTHIAZIDE 20-25 MG PO TABS: 1.0000 | ORAL_TABLET | Freq: Every day | ORAL | Status: DC

## 2014-10-28 MED ORDER — FUROSEMIDE 20 MG PO TABS: ORAL_TABLET | ORAL | Status: DC

## 2014-10-28 MED ORDER — POTASSIUM CHLORIDE CRYS ER 10 MEQ PO TBCR: EXTENDED_RELEASE_TABLET | ORAL | Status: DC

## 2014-10-28 MED ORDER — PIOGLITAZONE HCL 30 MG PO TABS: 30.0000 mg | ORAL_TABLET | Freq: Every day | ORAL | Status: DC

## 2014-12-02 ENCOUNTER — Encounter: Payer: Self-pay | Admitting: Family Medicine

## 2014-12-02 ENCOUNTER — Ambulatory Visit (INDEPENDENT_AMBULATORY_CARE_PROVIDER_SITE_OTHER): Payer: Commercial Managed Care - HMO | Admitting: Family Medicine

## 2014-12-02 VITALS — BP 114/70 | HR 68 | Ht <= 58 in | Wt 159.0 lb

## 2014-12-02 DIAGNOSIS — I152 Hypertension secondary to endocrine disorders: Secondary | ICD-10-CM

## 2014-12-02 DIAGNOSIS — E1159 Type 2 diabetes mellitus with other circulatory complications: Secondary | ICD-10-CM

## 2014-12-02 DIAGNOSIS — E039 Hypothyroidism, unspecified: Secondary | ICD-10-CM | POA: Diagnosis not present

## 2014-12-02 DIAGNOSIS — E785 Hyperlipidemia, unspecified: Secondary | ICD-10-CM

## 2014-12-02 DIAGNOSIS — E1169 Type 2 diabetes mellitus with other specified complication: Secondary | ICD-10-CM | POA: Diagnosis not present

## 2014-12-02 DIAGNOSIS — E669 Obesity, unspecified: Secondary | ICD-10-CM

## 2014-12-02 DIAGNOSIS — Q2733 Arteriovenous malformation of digestive system vessel: Secondary | ICD-10-CM

## 2014-12-02 DIAGNOSIS — D62 Acute posthemorrhagic anemia: Secondary | ICD-10-CM | POA: Diagnosis not present

## 2014-12-02 DIAGNOSIS — E1136 Type 2 diabetes mellitus with diabetic cataract: Secondary | ICD-10-CM | POA: Diagnosis not present

## 2014-12-02 DIAGNOSIS — I10 Essential (primary) hypertension: Secondary | ICD-10-CM

## 2014-12-02 DIAGNOSIS — K5521 Angiodysplasia of colon with hemorrhage: Secondary | ICD-10-CM

## 2014-12-02 DIAGNOSIS — E66811 Obesity, class 1: Secondary | ICD-10-CM

## 2014-12-02 LAB — POCT UA - MICROALBUMIN
ALBUMIN/CREATININE RATIO, URINE, POC: 13.4
CREATININE, POC: 105.9 mg/dL
MICROALBUMIN (UR) POC: 14.2 mg/L

## 2014-12-02 LAB — CBC WITH DIFFERENTIAL/PLATELET
BASOS ABS: 0.1 10*3/uL (ref 0.0–0.1)
BASOS PCT: 1 % (ref 0–1)
Eosinophils Absolute: 0.3 10*3/uL (ref 0.0–0.7)
Eosinophils Relative: 5 % (ref 0–5)
HCT: 29.7 % — ABNORMAL LOW (ref 36.0–46.0)
HEMOGLOBIN: 9.2 g/dL — AB (ref 12.0–15.0)
Lymphocytes Relative: 12 % (ref 12–46)
Lymphs Abs: 0.8 10*3/uL (ref 0.7–4.0)
MCH: 24.9 pg — ABNORMAL LOW (ref 26.0–34.0)
MCHC: 31 g/dL (ref 30.0–36.0)
MCV: 80.5 fL (ref 78.0–100.0)
MONOS PCT: 11 % (ref 3–12)
MPV: 9.7 fL (ref 8.6–12.4)
Monocytes Absolute: 0.7 10*3/uL (ref 0.1–1.0)
NEUTROS PCT: 71 % (ref 43–77)
Neutro Abs: 4.8 10*3/uL (ref 1.7–7.7)
PLATELETS: 350 10*3/uL (ref 150–400)
RBC: 3.69 MIL/uL — ABNORMAL LOW (ref 3.87–5.11)
RDW: 17.3 % — ABNORMAL HIGH (ref 11.5–15.5)
WBC: 6.7 10*3/uL (ref 4.0–10.5)

## 2014-12-02 LAB — POCT GLYCOSYLATED HEMOGLOBIN (HGB A1C): Hemoglobin A1C: 6.9

## 2014-12-02 NOTE — Progress Notes (Signed)
  Subjective:    Patient ID: Connie [name], female    DOB: [birth year], 81 y.o.   MRN: [id number]  Connie [name] is a 81 y.o. female who presents for follow-up of Type 2 diabetes mellitus.she also was recently evaluated for anemia and found to have an AVM. She needs followup blood work concerning this.  Home blood sugar records: couple times a week  Current symptoms/problems none Daily foot checks:   Any foot concerns: none Exercise: working in yard just stayed busy not walking due to not feeling well lately EYE: Dr.Walker Tia Alert she goes every 6 mth due to eye getting worse The following portions of the patient's history were reviewed and updated as appropriate: allergies, current medications, past medical history, past social history and problem list.  ROS as in subjective above.     Objective:    Physical Exam Alert and in no distress otherwise not examined.  Lab Review Diabetic Labs Latest Ref Rng 07/25/2014 11/23/2012 03/15/2012 07/12/2011  HbA1c - 6.9 6.9 7.8(H) -  Microalbumin 0.00 - 1.89 mg/dL - - 0.72 -  Micro/Creat Ratio 0.0 - 30.0 mg/g - - 13.9 -  Chol 0 - 200 mg/dL 162 - 149 -  HDL >39 mg/dL 52 - 43 -  Calc LDL 0 - 99 mg/dL 80 - 69 -  Triglycerides <150 mg/dL 152(H) - 184(H) -  Creatinine 0.50 - 1.10 mg/dL 1.35(H) - 1.20(H) 0.99   BP/Weight 09/11/2014 08/30/2014 07/25/2014 10/29/2013 46/01/6811  Systolic BP 751 700 174 944 967  Diastolic BP 58 46 70 80 80  Wt. (Lbs) 160 - 165 154 159  BMI 34.61 - 35.7 33.32 34.4   Foot/eye exam completion dates Latest Ref Rng 07/25/2014 03/21/2014  Eye Exam No Retinopathy - No Retinopathy  Foot Form Completion - Done -    Anyela  reports that she has never smoked. She has never used smokeless tobacco. She reports that she does not drink alcohol or use illicit drugs. Hemoglobin A1c is 6.9    Assessment & Plan:    Type 2 diabetes mellitus with diabetic cataract - Plan: POCT glycosylated hemoglobin (Hb A1C), POCT UA -  Microalbumin  Obesity (BMI 30.0-34.9)  Hypothyroidism, unspecified hypothyroidism type  Hypertension associated with diabetes  Hyperlipidemia associated with type 2 diabetes mellitus  AVM (arteriovenous malformation) of colon with hemorrhage - Plan: CBC with Differential/Platelet  Acute blood loss anemia - Plan: CBC with Differential/Platelet 1.  2. Rx changes:none 3. Education: Reviewed 'ABCs' of diabetes management (respective goals in parentheses):  A1C (<7), blood pressure (<130/80), and cholesterol (LDL <100). 4. Compliance at present is estimated to be good. Efforts to improve compliance (if necessary) will be directed at increasing physical activity. Follow up: 4 months

## 2014-12-03 ENCOUNTER — Other Ambulatory Visit: Payer: Self-pay

## 2014-12-03 DIAGNOSIS — D649 Anemia, unspecified: Secondary | ICD-10-CM

## 2015-01-03 ENCOUNTER — Other Ambulatory Visit: Payer: Commercial Managed Care - HMO

## 2015-01-03 DIAGNOSIS — D649 Anemia, unspecified: Secondary | ICD-10-CM

## 2015-01-03 LAB — CBC WITH DIFFERENTIAL/PLATELET
BASOS ABS: 0.1 10*3/uL (ref 0.0–0.1)
BASOS PCT: 1 % (ref 0–1)
Eosinophils Absolute: 0.3 10*3/uL (ref 0.0–0.7)
Eosinophils Relative: 5 % (ref 0–5)
HEMATOCRIT: 30.7 % — AB (ref 36.0–46.0)
HEMOGLOBIN: 9.4 g/dL — AB (ref 12.0–15.0)
Lymphocytes Relative: 19 % (ref 12–46)
Lymphs Abs: 1 10*3/uL (ref 0.7–4.0)
MCH: 25 pg — ABNORMAL LOW (ref 26.0–34.0)
MCHC: 30.6 g/dL (ref 30.0–36.0)
MCV: 81.6 fL (ref 78.0–100.0)
MONOS PCT: 12 % (ref 3–12)
MPV: 10.3 fL (ref 8.6–12.4)
Monocytes Absolute: 0.7 10*3/uL (ref 0.1–1.0)
NEUTROS PCT: 63 % (ref 43–77)
Neutro Abs: 3.5 10*3/uL (ref 1.7–7.7)
Platelets: 332 10*3/uL (ref 150–400)
RBC: 3.76 MIL/uL — AB (ref 3.87–5.11)
RDW: 16.9 % — ABNORMAL HIGH (ref 11.5–15.5)
WBC: 5.5 10*3/uL (ref 4.0–10.5)

## 2015-01-07 ENCOUNTER — Other Ambulatory Visit: Payer: Commercial Managed Care - HMO

## 2015-01-07 DIAGNOSIS — D649 Anemia, unspecified: Secondary | ICD-10-CM

## 2015-01-08 LAB — IRON AND TIBC
%SAT: 17 % — ABNORMAL LOW (ref 20–55)
Iron: 64 ug/dL (ref 42–145)
TIBC: 375 ug/dL (ref 250–470)
UIBC: 311 ug/dL (ref 125–400)

## 2015-01-08 LAB — FERRITIN: FERRITIN: 19 ng/mL (ref 10–291)

## 2015-01-10 ENCOUNTER — Telehealth: Payer: Self-pay | Admitting: Family Medicine

## 2015-01-10 NOTE — Telephone Encounter (Signed)
Patient called stating Dr. Ulyses Amor office requested that we send her last labs to them. Told patient that I would call them because they can view her labs in Epic. Contacted Dr. Ulyses Amor office, they will see waht labs have been done and see if Dr, Benson Norway needs any more and contact patient

## 2015-02-19 ENCOUNTER — Emergency Department (HOSPITAL_COMMUNITY): Payer: Commercial Managed Care - HMO

## 2015-02-19 ENCOUNTER — Encounter (HOSPITAL_COMMUNITY): Payer: Self-pay | Admitting: *Deleted

## 2015-02-19 ENCOUNTER — Inpatient Hospital Stay (HOSPITAL_COMMUNITY): Payer: Commercial Managed Care - HMO

## 2015-02-19 ENCOUNTER — Inpatient Hospital Stay (HOSPITAL_COMMUNITY)
Admission: EM | Admit: 2015-02-19 | Discharge: 2015-02-21 | DRG: 812 | Disposition: A | Discharge status: Home / Self Care | Payer: Commercial Managed Care - HMO | Attending: Internal Medicine | Admitting: Internal Medicine

## 2015-02-19 ENCOUNTER — Telehealth: Payer: Self-pay | Admitting: Family Medicine

## 2015-02-19 ENCOUNTER — Encounter: Payer: Self-pay | Admitting: Family Medicine

## 2015-02-19 ENCOUNTER — Ambulatory Visit (INDEPENDENT_AMBULATORY_CARE_PROVIDER_SITE_OTHER): Payer: Commercial Managed Care - HMO | Admitting: Family Medicine

## 2015-02-19 VITALS — BP 100/50 | HR 83 | Wt 147.0 lb

## 2015-02-19 DIAGNOSIS — K5730 Diverticulosis of large intestine without perforation or abscess without bleeding: Secondary | ICD-10-CM | POA: Diagnosis not present

## 2015-02-19 DIAGNOSIS — D473 Essential (hemorrhagic) thrombocythemia: Secondary | ICD-10-CM | POA: Diagnosis not present

## 2015-02-19 DIAGNOSIS — E039 Hypothyroidism, unspecified: Secondary | ICD-10-CM | POA: Diagnosis present

## 2015-02-19 DIAGNOSIS — I152 Hypertension secondary to endocrine disorders: Secondary | ICD-10-CM | POA: Diagnosis present

## 2015-02-19 DIAGNOSIS — I52 Other heart disorders in diseases classified elsewhere: Secondary | ICD-10-CM | POA: Diagnosis not present

## 2015-02-19 DIAGNOSIS — E780 Pure hypercholesterolemia: Secondary | ICD-10-CM | POA: Diagnosis present

## 2015-02-19 DIAGNOSIS — Q2733 Arteriovenous malformation of digestive system vessel: Secondary | ICD-10-CM

## 2015-02-19 DIAGNOSIS — E785 Hyperlipidemia, unspecified: Secondary | ICD-10-CM | POA: Diagnosis present

## 2015-02-19 DIAGNOSIS — E1136 Type 2 diabetes mellitus with diabetic cataract: Secondary | ICD-10-CM | POA: Diagnosis present

## 2015-02-19 DIAGNOSIS — M1990 Unspecified osteoarthritis, unspecified site: Secondary | ICD-10-CM | POA: Diagnosis present

## 2015-02-19 DIAGNOSIS — I10 Essential (primary) hypertension: Secondary | ICD-10-CM

## 2015-02-19 DIAGNOSIS — R531 Weakness: Secondary | ICD-10-CM | POA: Diagnosis not present

## 2015-02-19 DIAGNOSIS — E669 Obesity, unspecified: Secondary | ICD-10-CM | POA: Diagnosis present

## 2015-02-19 DIAGNOSIS — Z6820 Body mass index (BMI) 20.0-20.9, adult: Secondary | ICD-10-CM | POA: Diagnosis not present

## 2015-02-19 DIAGNOSIS — E119 Type 2 diabetes mellitus without complications: Secondary | ICD-10-CM | POA: Diagnosis not present

## 2015-02-19 DIAGNOSIS — N179 Acute kidney failure, unspecified: Secondary | ICD-10-CM

## 2015-02-19 DIAGNOSIS — K5521 Angiodysplasia of colon with hemorrhage: Secondary | ICD-10-CM | POA: Diagnosis not present

## 2015-02-19 DIAGNOSIS — E1169 Type 2 diabetes mellitus with other specified complication: Secondary | ICD-10-CM | POA: Diagnosis not present

## 2015-02-19 DIAGNOSIS — D649 Anemia, unspecified: Secondary | ICD-10-CM | POA: Diagnosis not present

## 2015-02-19 DIAGNOSIS — R42 Dizziness and giddiness: Secondary | ICD-10-CM

## 2015-02-19 DIAGNOSIS — K5520 Angiodysplasia of colon without hemorrhage: Secondary | ICD-10-CM

## 2015-02-19 DIAGNOSIS — L309 Dermatitis, unspecified: Secondary | ICD-10-CM | POA: Diagnosis present

## 2015-02-19 DIAGNOSIS — N281 Cyst of kidney, acquired: Secondary | ICD-10-CM | POA: Diagnosis not present

## 2015-02-19 DIAGNOSIS — D62 Acute posthemorrhagic anemia: Secondary | ICD-10-CM | POA: Diagnosis not present

## 2015-02-19 DIAGNOSIS — J309 Allergic rhinitis, unspecified: Secondary | ICD-10-CM | POA: Diagnosis present

## 2015-02-19 DIAGNOSIS — E1159 Type 2 diabetes mellitus with other circulatory complications: Secondary | ICD-10-CM | POA: Diagnosis present

## 2015-02-19 DIAGNOSIS — D500 Iron deficiency anemia secondary to blood loss (chronic): Secondary | ICD-10-CM | POA: Diagnosis not present

## 2015-02-19 DIAGNOSIS — E118 Type 2 diabetes mellitus with unspecified complications: Secondary | ICD-10-CM | POA: Insufficient documentation

## 2015-02-19 LAB — CBC WITH DIFFERENTIAL/PLATELET
Basophils Absolute: 0.1 10*3/uL (ref 0.0–0.1)
Basophils Relative: 1 % (ref 0–1)
EOS PCT: 5 % (ref 0–5)
Eosinophils Absolute: 0.5 10*3/uL (ref 0.0–0.7)
HCT: 21.1 % — ABNORMAL LOW (ref 36.0–46.0)
Hemoglobin: 6.8 g/dL — CL (ref 12.0–15.0)
LYMPHS ABS: 1.5 10*3/uL (ref 0.7–4.0)
Lymphocytes Relative: 16 % (ref 12–46)
MCH: 25.5 pg — ABNORMAL LOW (ref 26.0–34.0)
MCHC: 32.2 g/dL (ref 30.0–36.0)
MCV: 79 fL (ref 78.0–100.0)
MONOS PCT: 12 % (ref 3–12)
MPV: 9.4 fL (ref 8.6–12.4)
Monocytes Absolute: 1.1 10*3/uL — ABNORMAL HIGH (ref 0.1–1.0)
NEUTROS PCT: 66 % (ref 43–77)
Neutro Abs: 6 10*3/uL (ref 1.7–7.7)
Platelets: 543 10*3/uL — ABNORMAL HIGH (ref 150–400)
RBC: 2.67 MIL/uL — ABNORMAL LOW (ref 3.87–5.11)
RDW: 16.2 % — AB (ref 11.5–15.5)
WBC: 9.1 10*3/uL (ref 4.0–10.5)

## 2015-02-19 LAB — COMPREHENSIVE METABOLIC PANEL
ALBUMIN: 3.4 g/dL — AB (ref 3.5–5.0)
ALT: 9 U/L — ABNORMAL LOW (ref 14–54)
AST: 22 U/L (ref 15–41)
Alkaline Phosphatase: 56 U/L (ref 38–126)
Anion gap: 15 (ref 5–15)
BUN: 47 mg/dL — ABNORMAL HIGH (ref 6–20)
CHLORIDE: 102 mmol/L (ref 101–111)
CO2: 18 mmol/L — AB (ref 22–32)
Calcium: 8.8 mg/dL — ABNORMAL LOW (ref 8.9–10.3)
Creatinine, Ser: 2.4 mg/dL — ABNORMAL HIGH (ref 0.44–1.00)
GFR calc Af Amer: 21 mL/min — ABNORMAL LOW (ref >60)
GFR, EST NON AFRICAN AMERICAN: 18 mL/min — AB (ref >60)
Glucose, Bld: 223 mg/dL — ABNORMAL HIGH (ref 65–99)
POTASSIUM: 4.5 mmol/L (ref 3.5–5.1)
Sodium: 135 mmol/L (ref 135–145)
TOTAL PROTEIN: 6.6 g/dL (ref 6.5–8.1)
Total Bilirubin: 0.4 mg/dL (ref 0.3–1.2)

## 2015-02-19 LAB — URINALYSIS, ROUTINE W REFLEX MICROSCOPIC
Bilirubin Urine: NEGATIVE
Glucose, UA: NEGATIVE mg/dL
HGB URINE DIPSTICK: NEGATIVE
KETONES UR: 15 mg/dL — AB
Nitrite: NEGATIVE
Protein, ur: NEGATIVE mg/dL
Specific Gravity, Urine: 1.018 (ref 1.005–1.030)
Urobilinogen, UA: 0.2 mg/dL (ref 0.0–1.0)
pH: 5 (ref 5.0–8.0)

## 2015-02-19 LAB — URINE MICROSCOPIC-ADD ON

## 2015-02-19 LAB — BASIC METABOLIC PANEL
BUN: 46 mg/dL — ABNORMAL HIGH (ref 6–23)
CO2: 20 mEq/L (ref 19–32)
Calcium: 9 mg/dL (ref 8.4–10.5)
Chloride: 103 mEq/L (ref 96–112)
Creat: 1.98 mg/dL — ABNORMAL HIGH (ref 0.50–1.10)
Glucose, Bld: 115 mg/dL — ABNORMAL HIGH (ref 70–99)
POTASSIUM: 5 meq/L (ref 3.5–5.3)
SODIUM: 137 meq/L (ref 135–145)

## 2015-02-19 LAB — CBC
HCT: 21.3 % — ABNORMAL LOW (ref 36.0–46.0)
HEMOGLOBIN: 6.6 g/dL — AB (ref 12.0–15.0)
MCH: 25.5 pg — AB (ref 26.0–34.0)
MCHC: 31 g/dL (ref 30.0–36.0)
MCV: 82.2 fL (ref 78.0–100.0)
PLATELETS: 547 10*3/uL — AB (ref 150–400)
RBC: 2.59 MIL/uL — ABNORMAL LOW (ref 3.87–5.11)
RDW: 16.4 % — ABNORMAL HIGH (ref 11.5–15.5)
WBC: 9 10*3/uL (ref 4.0–10.5)

## 2015-02-19 LAB — PROTIME-INR
INR: 1.12 (ref 0.00–1.49)
Prothrombin Time: 14.6 seconds (ref 11.6–15.2)

## 2015-02-19 LAB — ABO/RH: ABO/RH(D): A POS

## 2015-02-19 LAB — I-STAT CG4 LACTIC ACID, ED: LACTIC ACID, VENOUS: 2.51 mmol/L — AB (ref 0.5–2.0)

## 2015-02-19 LAB — APTT: aPTT: 28 seconds (ref 24–37)

## 2015-02-19 LAB — GLUCOSE, CAPILLARY: Glucose-Capillary: 133 mg/dL — ABNORMAL HIGH (ref 65–99)

## 2015-02-19 LAB — PREPARE RBC (CROSSMATCH)

## 2015-02-19 LAB — HEMOGLOBIN POC (KUC): HEMOGLOBIN POC (KUC): 5.7 g/dL — AB (ref 12–15)

## 2015-02-19 MED ORDER — SODIUM CHLORIDE 0.9 % IV BOLUS (SEPSIS)
250.0000 mL | Freq: Once | INTRAVENOUS | Status: AC
  Administered 2015-02-19: 250 mL via INTRAVENOUS

## 2015-02-19 MED ORDER — ASPIRIN EC 81 MG PO TBEC
81.0000 mg | DELAYED_RELEASE_TABLET | Freq: Every day | ORAL | Status: DC
  Administered 2015-02-20 – 2015-02-21 (×2): 81 mg via ORAL
  Filled 2015-02-19 (×2): qty 1

## 2015-02-19 MED ORDER — INSULIN ASPART 100 UNIT/ML ~~LOC~~ SOLN
0.0000 [IU] | SUBCUTANEOUS | Status: DC
  Administered 2015-02-19: 1 [IU] via SUBCUTANEOUS

## 2015-02-19 MED ORDER — ACETAMINOPHEN 650 MG RE SUPP: 650.0000 mg | Freq: Four times a day (QID) | RECTAL | Status: DC | PRN

## 2015-02-19 MED ORDER — ATORVASTATIN CALCIUM 40 MG PO TABS
40.0000 mg | ORAL_TABLET | Freq: Every day | ORAL | Status: DC
  Administered 2015-02-20: 40 mg via ORAL
  Filled 2015-02-19 (×2): qty 1

## 2015-02-19 MED ORDER — LEVOTHYROXINE SODIUM 125 MCG PO TABS
125.0000 ug | ORAL_TABLET | Freq: Every day | ORAL | Status: DC
  Administered 2015-02-20: 125 ug via ORAL
  Filled 2015-02-19 (×3): qty 1

## 2015-02-19 MED ORDER — SODIUM CHLORIDE 0.9 % IV SOLN: INTRAVENOUS | Status: DC

## 2015-02-19 MED ORDER — VITAMIN C 500 MG PO TABS
500.0000 mg | ORAL_TABLET | Freq: Every day | ORAL | Status: DC
  Administered 2015-02-20 – 2015-02-21 (×2): 500 mg via ORAL
  Filled 2015-02-19 (×2): qty 1

## 2015-02-19 MED ORDER — ASPIRIN 81 MG PO TABS: 81.0000 mg | ORAL_TABLET | Freq: Every day | ORAL | Status: DC

## 2015-02-19 MED ORDER — IRON-VITAMIN C 65-125 MG PO TABS
1.0000 | ORAL_TABLET | Freq: Every day | ORAL | Status: DC
Start: 2015-02-19 — End: 2015-02-19

## 2015-02-19 MED ORDER — SODIUM CHLORIDE 0.9 % IV SOLN: 10.0000 mL/h | Freq: Once | INTRAVENOUS | Status: DC

## 2015-02-19 MED ORDER — PIOGLITAZONE HCL 30 MG PO TABS
30.0000 mg | ORAL_TABLET | Freq: Every day | ORAL | Status: DC
  Filled 2015-02-19 (×2): qty 1

## 2015-02-19 MED ORDER — SODIUM CHLORIDE 0.9 % IV SOLN
INTRAVENOUS | Status: AC
  Administered 2015-02-19: 19:00:00 via INTRAVENOUS

## 2015-02-19 MED ORDER — AMLODIPINE BESYLATE 10 MG PO TABS
10.0000 mg | ORAL_TABLET | Freq: Every day | ORAL | Status: DC
  Administered 2015-02-20 – 2015-02-21 (×2): 10 mg via ORAL
  Filled 2015-02-19 (×2): qty 1

## 2015-02-19 MED ORDER — ACETAMINOPHEN 325 MG PO TABS: 650.0000 mg | ORAL_TABLET | Freq: Four times a day (QID) | ORAL | Status: DC | PRN

## 2015-02-19 MED ORDER — SODIUM CHLORIDE 0.9 % IJ SOLN
3.0000 mL | Freq: Two times a day (BID) | INTRAMUSCULAR | Status: DC
  Administered 2015-02-19 – 2015-02-21 (×3): 3 mL via INTRAVENOUS

## 2015-02-19 MED ORDER — FERROUS SULFATE 325 (65 FE) MG PO TABS
325.0000 mg | ORAL_TABLET | Freq: Every day | ORAL | Status: DC
  Administered 2015-02-20: 325 mg via ORAL
  Filled 2015-02-19 (×2): qty 1

## 2015-02-19 MED ORDER — SODIUM CHLORIDE 0.9 % IV SOLN
INTRAVENOUS | Status: DC
  Administered 2015-02-19: 21:00:00 via INTRAVENOUS

## 2015-02-19 MED ORDER — INSULIN ASPART 100 UNIT/ML ~~LOC~~ SOLN
0.0000 [IU] | Freq: Three times a day (TID) | SUBCUTANEOUS | Status: DC
Start: 2015-02-20 — End: 2015-02-19

## 2015-02-19 MED ORDER — CLONIDINE HCL 0.1 MG PO TABS
0.1000 mg | ORAL_TABLET | Freq: Two times a day (BID) | ORAL | Status: DC
  Administered 2015-02-19 – 2015-02-21 (×4): 0.1 mg via ORAL
  Filled 2015-02-19 (×5): qty 1

## 2015-02-19 NOTE — Telephone Encounter (Signed)
Called pt per Dr. Lanice Shirts instrustions.  Hemoglobin is low, she is anemic.  Dr. Redmond School spoke with Dr. Benson Norway.  Pt advised to go to [facility] ER and advise them, she is there to see Dr. Benson Norway.  Pt understood.

## 2015-02-19 NOTE — ED Notes (Signed)
Admitting MD at bedside.

## 2015-02-19 NOTE — Progress Notes (Signed)
   Subjective:    Patient ID: Connie [name], female    DOB: [birth year], 81 y.o.   MRN: [id number]  HPI She is here for consult concerning increased difficulty with dizziness and diarrhea and intermittent black tarry stools. She also is been having difficulty with sleep. She has a previous history of coal I'll make AVM. Her last hemoglobin was 9.4 in April. She states that the dizziness and the black stools become more prominent in the last several weeks. The leg cramping keeps her awake at night. She does complain of malaise as well. She continues on iron supplements.   Review of Systems     Objective:   Physical Exam Alert and slightly pale appearing. Conjunctiva are pale. Postural signs are noted and are positive. Hemoglobin here in the office is 5.7. Stat blood work did show hemoglobin of 6.8.       Assessment & Plan:  Dizzy - Plan: Hemoglobin POC  AVM (arteriovenous malformation) of colon - Plan: CBC with Differential/Platelet, Basic Metabolic Panel  Acute blood loss anemia - Plan: CBC with Differential/Platelet, Basic Metabolic Panel  Case was discussed with Dr. Benson Norway and she will be referred to the hospital for further evaluation and care.

## 2015-02-19 NOTE — ED Provider Notes (Signed)
CSN: [id number]     Arrival date & time 02/19/15  1617 History   First MD Initiated Contact with Patient 02/19/15 1652     Chief Complaint  Patient presents with  . Anemia     (Consider location/radiation/quality/duration/timing/severity/associated sxs/prior Treatment) Patient is a 81 y.o. female presenting with anemia. The history is provided by the patient.  Anemia Pertinent negatives include no chest pain, no abdominal pain, no headaches and no shortness of breath.   patient sent in by Dr. Lanice Shirts office for a low bit of 6.8. Patient had been feeling a little lightheaded the past few days of feeling a little bit of fatigue the past week. Patient has been on iron since December for anemia did have a GI workup done at that time without any significant findings. Patient states that her bowel movements of the black since being on the iron no change in the color no signs of any red blood.  Past Medical History  Diagnosis Date  . Hypertension   . Obesity   . Arthritis   . Allergy     RHINITIS  . Hypothyroidism   . Hypercholesteremia   . Diabetes mellitus    Past Surgical History  Procedure Laterality Date  . Total thyroidectomy    . Fracture surgery      RIGHT SHOULDER   . Joint replacement      RIGHT KNEE & LEFT KNEE  . Colonoscopy N/A 08/30/2014    Procedure: COLONOSCOPY;  Surgeon: Beryle Beams, MD;  Location: [facility];  Service: Endoscopy;  Laterality: N/A;  . Hot hemostasis N/A 08/30/2014    Procedure: HOT HEMOSTASIS (ARGON PLASMA COAGULATION/BICAP);  Surgeon: Beryle Beams, MD;  Location: [facility];  Service: Endoscopy;  Laterality: N/A;   History reviewed. No pertinent family history. History  Substance Use Topics  . Smoking status: Never Smoker   . Smokeless tobacco: Never Used  . Alcohol Use: No   OB History    No data available     Review of Systems  Constitutional: Positive for fatigue. Negative for fever.  HENT: Negative for congestion.   Eyes:  Negative for visual disturbance.  Respiratory: Negative for shortness of breath.   Cardiovascular: Negative for chest pain.  Gastrointestinal: Negative for abdominal pain.  Genitourinary: Negative for dysuria.  Musculoskeletal: Negative for back pain.  Skin: Negative for rash.  Neurological: Positive for light-headedness. Negative for dizziness, syncope and headaches.  Hematological: Does not bruise/bleed easily.  Psychiatric/Behavioral: Negative for confusion.      Allergies  Review of patient's allergies indicates no known allergies.  Home Medications   Prior to Admission medications   Medication Sig Start Date End Date Taking? Authorizing Provider  amLODipine (NORVASC) 10 MG tablet TAKE ONE TABLET BY MOUTH EVERY DAY 07/18/14  Yes [name], MD  aspirin 81 MG tablet Take 81 mg by mouth daily.     Yes Historical Provider, MD  atorvastatin (LIPITOR) 40 MG tablet TAKE ONE TABLET BY MOUTH ONCE DAILY 10/28/14  Yes [name], MD  benazepril-hydrochlorthiazide (LOTENSIN HCT) 20-25 MG per tablet Take 1 tablet by mouth daily. 10/28/14  Yes [name], MD  cloNIDine (CATAPRES) 0.1 MG tablet TAKE ONE TABLET BY MOUTH TWICE DAILY 07/25/14  Yes [name], MD  furosemide (LASIX) 20 MG tablet TAKE ONE TABLET BY MOUTH EVERY DAY 10/28/14  Yes [name], MD  Iron-Vitamin C 65-125 MG TABS Take 1 tablet by mouth daily.   Yes Historical Provider, MD  levothyroxine (SYNTHROID, LEVOTHROID) 125 MCG tablet TAKE ONE TABLET BY MOUTH EVERY DAY 10/28/14  Yes [name], MD  metFORMIN (GLUCOPHAGE) 850 MG tablet Take 1 tablet (850 mg total) by mouth 2 (two) times daily with a meal. 07/25/14  Yes [name], MD  pioglitazone (ACTOS) 30 MG tablet Take 1 tablet (30 mg total) by mouth daily. 10/28/14  Yes [name], MD  potassium chloride (KLOR-CON M10) 10 MEQ tablet TAKE ONE TABLET BY MOUTH TWICE DAILY 10/28/14  Yes [name], MD  glucose blood test strip onetouch verio test strip patient  is to test one time a day 07/25/14   [name], MD  [facility] DELICA LANCETS FINE MISC 1 each by Does not apply route daily. 07/25/14   [name], MD  triamcinolone cream (KENALOG) 0.1 % Apply 1 application topically 2 (two) times daily. Patient not taking: Reported on 02/19/2015 09/11/14   [name], MD   BP 123/35 mmHg  Pulse 87  Temp(Src) 98 F (36.7 C)  Resp 20  Wt 146 lb (66.225 kg)  SpO2 100% Physical Exam  Constitutional: She is oriented to person, place, and time. She appears well-developed and well-nourished. No distress.  HENT:  Head: Normocephalic and atraumatic.  Mouth/Throat: Oropharynx is clear and moist.  Sclera fairly pale.  Eyes: Conjunctivae and EOM are normal. Pupils are equal, round, and reactive to light.  Neck: Normal range of motion.  Cardiovascular: Normal rate, regular rhythm and intact distal pulses.   No murmur heard. Pulmonary/Chest: Effort normal and breath sounds normal. No respiratory distress.  Abdominal: Soft. Bowel sounds are normal. There is no tenderness.  Musculoskeletal: Normal range of motion. She exhibits no edema.  Neurological: She is alert and oriented to person, place, and time. No cranial nerve deficit. She exhibits normal muscle tone. Coordination normal.  Skin: Skin is warm. No pallor.  Nursing note and vitals reviewed.   ED Course  Procedures (including critical care time) Labs Review Labs Reviewed  COMPREHENSIVE METABOLIC PANEL - Abnormal; Notable for the following:    CO2 18 (*)    Glucose, Bld 223 (*)    BUN 47 (*)    Creatinine, Ser 2.40 (*)    Calcium 8.8 (*)    Albumin 3.4 (*)    ALT 9 (*)    GFR calc non Af Amer 18 (*)    GFR calc Af Amer 21 (*)    All other components within normal limits  CBC - Abnormal; Notable for the following:    RBC 2.59 (*)    Hemoglobin 6.6 (*)    HCT 21.3 (*)    MCH 25.5 (*)    RDW 16.4 (*)    Platelets 547 (*)    All other components within normal limits  I-STAT CG4 LACTIC  ACID, ED - Abnormal; Notable for the following:    Lactic Acid, Venous 2.51 (*)    All other components within normal limits  APTT  PROTIME-INR  URINALYSIS, ROUTINE W REFLEX MICROSCOPIC (NOT AT [facility])  TYPE AND SCREEN  ABO/RH  PREPARE RBC (CROSSMATCH)    Imaging Review No results found.   EKG Interpretation None       CRITICAL CARE Performed by: Fredia Sorrow Total critical care time: 30 Critical care time was exclusive of separately billable procedures and treating other patients. Critical care was necessary to treat or prevent imminent or life-threatening deterioration. Critical care was time spent personally by me on the following activities: development  of treatment plan with patient and/or surrogate as well as nursing, discussions with consultants, evaluation of patient's response to treatment, examination of patient, obtaining history from patient or surrogate, ordering and performing treatments and interventions, ordering and review of laboratory studies, ordering and review of radiographic studies, pulse oximetry and re-evaluation of patient's condition.    MDM   Final diagnoses:  Anemia    Patient with significant anemia. Some lightheadedness. Hemoglobin 6.6 here. Noted to be 6.8 at her primary care doctor's office referred in for blood transfusion. Patient has had dark stools ever since starting iron back in December. Patient's noted no red blood. Patient had the feeling of some fatigue and not feeling really well for about the last week had some occasional abdominal discomfort but nothing consistent. No nausea no vomiting no syncopal episodes. No chest pain no shortness of breath.   Patient arranged to receive red blood cells 2 units initially. Admission will be arranged. Patient is followed by Dr. Denzil Magnuson from GI medicine. Patient did have colonoscopy and GI workup back in December when she was started on the iron. Patient has not had a blood transfusion  recently.  Other labs show elevation BUN and creatinine may be a little bit of prerenal. Patient also seems to have some evidence of renal insufficiency. And after consultation with internal medicine most likely the low bicarbonate and the lactic acid is due to being on the metformin.    Fredia Sorrow, MD 02/19/15 [phone]

## 2015-02-19 NOTE — ED Notes (Signed)
Report attempted 

## 2015-02-19 NOTE — ED Notes (Signed)
Pt in stating she was told to come in due to low hgb, reports dark tarry stools recently, pt pale on arrival, no distress noted

## 2015-02-19 NOTE — H&P (Addendum)
Connie [name] is an 81 y.o. female.   Chief Complaint: anemia HPI: 81 yo female with dm2, hyperlipidemia, hypothyroidism, apparently c/o lightheadedness x 1 weeks, and not feeling well.  Pt has not been eating well.  Decrease in po intake.  Pt felt like she was anemic, and therefore presented to pcp office and was noted to be anemic, with hgb 6.6 and sent to ED for evaluation.  Pt noted to be anemic, and in ARF with lactic acidosis.  Due to metformin.    Past Medical History  Diagnosis Date  . Hypertension   . Obesity   . Arthritis   . Allergy     RHINITIS  . Hypothyroidism   . Hypercholesteremia   . Diabetes mellitus     Past Surgical History  Procedure Laterality Date  . Total thyroidectomy    . Fracture surgery      RIGHT SHOULDER   . Joint replacement      RIGHT KNEE & LEFT KNEE  . Colonoscopy N/A 08/30/2014    Procedure: COLONOSCOPY;  Surgeon: Beryle Beams, MD;  Location: [facility];  Service: Endoscopy;  Laterality: N/A;  . Hot hemostasis N/A 08/30/2014    Procedure: HOT HEMOSTASIS (ARGON PLASMA COAGULATION/BICAP);  Surgeon: Beryle Beams, MD;  Location: [facility];  Service: Endoscopy;  Laterality: N/A;    History reviewed. No pertinent family history. Social History:  reports that she has never smoked. She has never used smokeless tobacco. She reports that she does not drink alcohol or use illicit drugs.  Allergies: No Known Allergies Medications reviewed  Results for orders placed or performed during the hospital encounter of 02/19/15 (from the past 48 hour(s))  Type and screen     Status: None (Preliminary result)   Collection Time: 02/19/15  4:38 PM  Result Value Ref Range   ABO/RH(D) A POS    Antibody Screen NEG    Sample Expiration 02/22/2015    Unit Number [id number]    Blood Component Type RED CELLS,LR    Unit division 00    Status of Unit ISSUED    Transfusion Status OK TO TRANSFUSE    Crossmatch Result Compatible    Unit Number  [id number]    Blood Component Type RED CELLS,LR    Unit division 00    Status of Unit ALLOCATED    Transfusion Status OK TO TRANSFUSE    Crossmatch Result Compatible   Comprehensive metabolic panel     Status: Abnormal   Collection Time: 02/19/15  4:38 PM  Result Value Ref Range   Sodium 135 135 - 145 mmol/L   Potassium 4.5 3.5 - 5.1 mmol/L   Chloride 102 101 - 111 mmol/L   CO2 18 (L) 22 - 32 mmol/L   Glucose, Bld 223 (H) 65 - 99 mg/dL   BUN 47 (H) 6 - 20 mg/dL   Creatinine, Ser 2.40 (H) 0.44 - 1.00 mg/dL   Calcium 8.8 (L) 8.9 - 10.3 mg/dL   Total Protein 6.6 6.5 - 8.1 g/dL   Albumin 3.4 (L) 3.5 - 5.0 g/dL   AST 22 15 - 41 U/L   ALT 9 (L) 14 - 54 U/L   Alkaline Phosphatase 56 38 - 126 U/L   Total Bilirubin 0.4 0.3 - 1.2 mg/dL   GFR calc non Af Amer 18 (L) >60 mL/min   GFR calc Af Amer 21 (L) >60 mL/min    Comment: (NOTE) The eGFR has been calculated using the CKD EPI equation. This calculation  has not been validated in all clinical situations. eGFR's persistently <60 mL/min signify possible Chronic Kidney Disease.    Anion gap 15 5 - 15  CBC     Status: Abnormal   Collection Time: 02/19/15  4:38 PM  Result Value Ref Range   WBC 9.0 4.0 - 10.5 K/uL   RBC 2.59 (L) 3.87 - 5.11 MIL/uL   Hemoglobin 6.6 (LL) 12.0 - 15.0 g/dL    Comment: REPEATED TO VERIFY SPECIMEN CHECKED FOR CLOTS CRITICAL RESULT CALLED TO, READ BACK BY AND VERIFIED WITH: B MILLER,RN 1722 06/01/201 WBOND    HCT 21.3 (L) 36.0 - 46.0 %   MCV 82.2 78.0 - 100.0 fL   MCH 25.5 (L) 26.0 - 34.0 pg   MCHC 31.0 30.0 - 36.0 g/dL   RDW 16.4 (H) 11.5 - 15.5 %   Platelets 547 (H) 150 - 400 K/uL  ABO/Rh     Status: None   Collection Time: 02/19/15  4:38 PM  Result Value Ref Range   ABO/RH(D) A POS   APTT     Status: None   Collection Time: 02/19/15  5:09 PM  Result Value Ref Range   aPTT 28 24 - 37 seconds  Protime-INR     Status: None   Collection Time: 02/19/15  5:09 PM  Result Value Ref Range    Prothrombin Time 14.6 11.6 - 15.2 seconds   INR 1.12 0.00 - 1.49  I-Stat CG4 Lactic Acid, ED     Status: Abnormal   Collection Time: 02/19/15  5:19 PM  Result Value Ref Range   Lactic Acid, Venous 2.51 (HH) 0.5 - 2.0 mmol/L   Comment NOTIFIED PHYSICIAN   Prepare RBC     Status: None   Collection Time: 02/19/15  6:16 PM  Result Value Ref Range   Order Confirmation ORDER PROCESSED BY BLOOD BANK    Dg Chest Port 1 View  02/19/2015   CLINICAL DATA:  Acute onset of weakness.  Initial encounter.  EXAM: PORTABLE CHEST - 1 VIEW  COMPARISON:  Chest radiograph performed 12/23/2004  FINDINGS: The lungs are well-aerated and clear. There is no evidence of focal opacification, pleural effusion or pneumothorax.  The cardiomediastinal silhouette is within normal limits. No acute osseous abnormalities are seen. The patient is status post right-sided rotator cuff repair.  IMPRESSION: No acute cardiopulmonary process seen.   Electronically Signed   By: Garald Balding M.D.   On: 02/19/2015 19:06    Review of Systems  Constitutional: Negative.   HENT: Negative.   Eyes: Negative.   Respiratory: Negative.   Cardiovascular: Negative.   Gastrointestinal: Positive for abdominal pain. Negative for heartburn, nausea, vomiting, diarrhea, constipation, blood in stool and melena.       + black stool  Genitourinary: Negative.   Musculoskeletal: Negative.   Skin: Negative.   Neurological: Negative.   Endo/Heme/Allergies: Negative.   Psychiatric/Behavioral: Negative.     Blood pressure 152/44, pulse 87, temperature 97.8 F (36.6 C), temperature source Oral, resp. rate 20, height '4\' 11"'  (1.499 m), weight 66.225 kg (146 lb), SpO2 100 %. Physical Exam  Constitutional: She is oriented to person, place, and time. She appears well-developed and well-nourished.  HENT:  Head: Normocephalic and atraumatic.  Eyes: Conjunctivae and EOM are normal. Pupils are equal, round, and reactive to light. No scleral icterus.  Neck:  Normal range of motion. Neck supple. No JVD present. No tracheal deviation present. No thyromegaly present.  Cardiovascular: Normal rate and regular rhythm.  Exam reveals  no gallop and no friction rub.   No murmur heard. Respiratory: Effort normal and breath sounds normal. No respiratory distress. She has no wheezes. She has no rales.  GI: Soft. Bowel sounds are normal. She exhibits no distension. There is no tenderness. There is no rebound and no guarding.  Musculoskeletal: Normal range of motion. She exhibits no edema or tenderness.  Lymphadenopathy:    She has no cervical adenopathy.  Neurological: She is alert and oriented to person, place, and time. She has normal reflexes. She displays normal reflexes. No cranial nerve deficit. She exhibits normal muscle tone. Coordination normal.  Skin: Skin is warm and dry. No rash noted. No erythema. No pallor.  Psychiatric: She has a normal mood and affect. Her behavior is normal. Judgment and thought content normal.     Assessment/Plan  Anemia Transfuse 2 units prbc.  Check cbc after transfusion Gi consultation  ARF Check urine sodium, urine creatinine , urine eosinophils Check renal ultrasound Hydrate with normal saline Hold lisinopril/hydrochlorothiazide, and lasix  Dm2 Stop metformin due to lactic acidosis secondary to ARF Hydrate with normal saline  Hypertension Cont current medications.   Thrombocytosis Likely due to iron def.   Hypothyroidism Cont synthroid  DVT prophylaxis: scd    Jani Gravel 02/19/2015, 7:24 PM

## 2015-02-20 ENCOUNTER — Inpatient Hospital Stay (HOSPITAL_COMMUNITY): Payer: Commercial Managed Care - HMO

## 2015-02-20 DIAGNOSIS — I10 Essential (primary) hypertension: Secondary | ICD-10-CM

## 2015-02-20 DIAGNOSIS — N179 Acute kidney failure, unspecified: Secondary | ICD-10-CM | POA: Diagnosis present

## 2015-02-20 DIAGNOSIS — D649 Anemia, unspecified: Principal | ICD-10-CM

## 2015-02-20 DIAGNOSIS — E1169 Type 2 diabetes mellitus with other specified complication: Secondary | ICD-10-CM

## 2015-02-20 DIAGNOSIS — E039 Hypothyroidism, unspecified: Secondary | ICD-10-CM

## 2015-02-20 LAB — COMPREHENSIVE METABOLIC PANEL
ALK PHOS: 52 U/L (ref 38–126)
ALT: 8 U/L — AB (ref 14–54)
AST: 21 U/L (ref 15–41)
Albumin: 3 g/dL — ABNORMAL LOW (ref 3.5–5.0)
Anion gap: 10 (ref 5–15)
BUN: 41 mg/dL — ABNORMAL HIGH (ref 6–20)
CHLORIDE: 108 mmol/L (ref 101–111)
CO2: 20 mmol/L — AB (ref 22–32)
CREATININE: 1.77 mg/dL — AB (ref 0.44–1.00)
Calcium: 8.4 mg/dL — ABNORMAL LOW (ref 8.9–10.3)
GFR calc non Af Amer: 26 mL/min — ABNORMAL LOW (ref >60)
GFR, EST AFRICAN AMERICAN: 30 mL/min — AB (ref >60)
Glucose, Bld: 115 mg/dL — ABNORMAL HIGH (ref 65–99)
Potassium: 4.7 mmol/L (ref 3.5–5.1)
Sodium: 138 mmol/L (ref 135–145)
TOTAL PROTEIN: 6 g/dL — AB (ref 6.5–8.1)
Total Bilirubin: 1 mg/dL (ref 0.3–1.2)

## 2015-02-20 LAB — CBC
HCT: 27.8 % — ABNORMAL LOW (ref 36.0–46.0)
Hemoglobin: 9.1 g/dL — ABNORMAL LOW (ref 12.0–15.0)
MCH: 27.2 pg (ref 26.0–34.0)
MCHC: 32.7 g/dL (ref 30.0–36.0)
MCV: 83.2 fL (ref 78.0–100.0)
Platelets: 421 10*3/uL — ABNORMAL HIGH (ref 150–400)
RBC: 3.34 MIL/uL — ABNORMAL LOW (ref 3.87–5.11)
RDW: 15.1 % (ref 11.5–15.5)
WBC: 10.2 10*3/uL (ref 4.0–10.5)

## 2015-02-20 LAB — TYPE AND SCREEN
ABO/RH(D): A POS
ANTIBODY SCREEN: NEGATIVE
Unit division: 0
Unit division: 0

## 2015-02-20 LAB — GLUCOSE, CAPILLARY
GLUCOSE-CAPILLARY: 85 mg/dL (ref 65–99)
Glucose-Capillary: 104 mg/dL — ABNORMAL HIGH (ref 65–99)
Glucose-Capillary: 111 mg/dL — ABNORMAL HIGH (ref 65–99)
Glucose-Capillary: 149 mg/dL — ABNORMAL HIGH (ref 65–99)
Glucose-Capillary: 95 mg/dL (ref 65–99)

## 2015-02-20 LAB — SODIUM, URINE, RANDOM: Sodium, Ur: 67 mmol/L

## 2015-02-20 MED ORDER — FERROUS SULFATE 325 (65 FE) MG PO TABS
325.0000 mg | ORAL_TABLET | Freq: Every day | ORAL | Status: DC
  Filled 2015-02-20 (×2): qty 1

## 2015-02-20 MED ORDER — PEG 3350-KCL-NA BICARB-NACL 420 G PO SOLR
4000.0000 mL | Freq: Once | ORAL | Status: AC
  Administered 2015-02-20: 4000 mL via ORAL
  Filled 2015-02-20: qty 4000

## 2015-02-20 MED ORDER — BOOST / RESOURCE BREEZE PO LIQD: 1.0000 | Freq: Three times a day (TID) | ORAL | Status: DC

## 2015-02-20 MED ORDER — SODIUM CHLORIDE 0.9 % IV SOLN: INTRAVENOUS | Status: DC

## 2015-02-20 MED ORDER — SODIUM CHLORIDE 0.9 % IV SOLN
INTRAVENOUS | Status: DC
  Administered 2015-02-20: 21:00:00 via INTRAVENOUS
  Administered 2015-02-21: 500 mL via INTRAVENOUS

## 2015-02-20 MED ORDER — INSULIN ASPART 100 UNIT/ML ~~LOC~~ SOLN: 0.0000 [IU] | SUBCUTANEOUS | Status: DC

## 2015-02-20 NOTE — Progress Notes (Signed)
Initial Nutrition Assessment  DOCUMENTATION CODES:  Not applicable  INTERVENTION:  Boost Breeze TID  NUTRITION DIAGNOSIS:  Inadequate oral intake related to acute illness as evidenced by per patient/family report, percent weight loss.   GOAL:  Patient will meet greater than or equal to 90% of their needs   MONITOR:  PO intake, Diet advancement, Labs, Weight trends, I & O's  REASON FOR ASSESSMENT:  Malnutrition Screening Tool    ASSESSMENT: 81 yo female with T2DM, hyperlipidemia, hypothyroidism, apparently c/o lightheadedness x 1 weeks, and not feeling well. Pt has not been eating well. Decrease in po intake. Pt felt like she was anemic, and therefore presented to pcp office and was noted to be anemic, with hgb 6.6 and sent to ED for evaluation.  Pt states that for the past week she has had a poor appetite due to not eating well. She reports eating about 50% less than usual this week and losing from 154 lbs to 147 lbs. She reports some abdominal discomfort with PO intake. No evidence of muscle or fat wasting per physical exam. Pt agreeable to drinking Resource Breeze until diet is advanced.   Labs: low hemoglobin, low calcium, low GFR  Height:  Ht Readings from Last 1 Encounters:  02/19/15 4\' 11"  (1.499 m)    Weight:  Wt Readings from Last 1 Encounters:  02/20/15 147 lb 14.4 oz (67.087 kg)    Ideal Body Weight:  44.7 kg  Wt Readings from Last 10 Encounters:  02/20/15 147 lb 14.4 oz (67.087 kg)  02/19/15 147 lb (66.679 kg)  12/02/14 159 lb (72.122 kg)  09/11/14 160 lb (72.576 kg)  07/25/14 165 lb (74.844 kg)  10/29/13 154 lb (69.854 kg)  07/23/13 159 lb (72.122 kg)  06/07/13 152 lb (68.947 kg)  05/23/13 154 lb (69.854 kg)  11/23/12 157 lb (71.215 kg)    BMI:  Body mass index is 29.86 kg/(m^2).  Estimated Nutritional Needs:  Kcal:  1500-1700  Protein:  70-80 grams  Fluid:  1.7 L/day  Skin:  Reviewed, no issues  Diet Order:  Diet clear liquid  Room service appropriate?: Yes; Fluid consistency:: Thin Diet NPO time specified  EDUCATION NEEDS:  No education needs identified at this time   Intake/Output Summary (Last 24 hours) at 02/20/15 1352 Last data filed at 02/20/15 0936  Gross per 24 hour  Intake   2240 ml  Output    700 ml  Net   1540 ml    Last BM:  6/2  Pryor Ochoa RD, LDN Inpatient Clinical Dietitian Pager: [phone] After Hours Pager: [phone]

## 2015-02-20 NOTE — Progress Notes (Signed)
[facility] PROGRESS NOTE  Connie [name] Sayed CWC:[id number] DOB: 1934-09-02 DOA: 02/19/2015 PCP: Wyatt Haste, MD  Assessment/Plan: #1 symptomatic anemia Questionable etiology. Patient with prior history of colonic AVMs status post ablation December 2015. Patient with symptomatic improvement. Patient is status post 2 units packed red blood cells. Hemoglobin currently at 9.1. Continue serial CBCs. Continue iron supplementation. Patient has been seen by gastroenterology and recommended colonoscopy tomorrow. GI following.  #2 acute renal failure Likely secondary to prerenal azotemia. Urine sodium of 67. Renal ultrasound negative for hydronephrosis. ACE inhibitor and diuretics hold. Continue hydration. Follow.  #3 type 2 diabetes Sliding scale insulin.  #4 hypertension Stable. Continue Norvasc and clonidine.  #5 hypothyroidism Continue Synthroid.  #6 prophylaxis SCDs for DVT prophylaxis.   Code Status: Full Family Communication: Updated patient and husband at bedside. Disposition Plan: Home when medically stable.   Consultants:  Gastroenterology: Dr. Benson Norway 02/20/2015  Procedures:  Renal ultrasound 02/20/2015  Chest x-ray 02/19/2015  2 units packed red blood cells 02/19/2015  Antibiotics:  None  HPI/Subjective: Patient denies any overt bleeding. Patient states weakness has improved. Patient denies any shortness of breath. No chest pain.  Objective: Filed Vitals:   02/20/15 1548  BP: 128/41  Pulse: 78  Temp: 98.1 F (36.7 C)  Resp:     Intake/Output Summary (Last 24 hours) at 02/20/15 2020 Last data filed at 02/20/15 1332  Gross per 24 hour  Intake   2255 ml  Output    700 ml  Net   1555 ml   Filed Weights   02/19/15 1848 02/19/15 2005 02/20/15 0410  Weight: 66.225 kg (146 lb) 66.906 kg (147 lb 8 oz) 67.087 kg (147 lb 14.4 oz)    Exam:   General:  NAD  Cardiovascular: RRR  Respiratory: CTAB  Abdomen: Soft, nontender,  nondistended, positive bowel sounds.  Musculoskeletal: No clubbing cyanosis or edema.  Data Reviewed: Basic Metabolic Panel:  Recent Labs Lab 02/19/15 0001 02/19/15 1638 02/20/15 0342  NA 137 135 138  K 5.0 4.5 4.7  CL 103 102 108  CO2 20 18* 20*  GLUCOSE 115* 223* 115*  BUN 46* 47* 41*  CREATININE 1.98* 2.40* 1.77*  CALCIUM 9.0 8.8* 8.4*   Liver Function Tests:  Recent Labs Lab 02/19/15 1638 02/20/15 0342  AST 22 21  ALT 9* 8*  ALKPHOS 56 52  BILITOT 0.4 1.0  PROT 6.6 6.0*  ALBUMIN 3.4* 3.0*   No results for input(s): LIPASE, AMYLASE in the last 168 hours. No results for input(s): AMMONIA in the last 168 hours. CBC:  Recent Labs Lab 02/19/15 0001 02/19/15 1638 02/20/15 0342  WBC 9.1 9.0 10.2  NEUTROABS 6.0  --   --   HGB 6.8* 6.6* 9.1*  HCT 21.1* 21.3* 27.8*  MCV 79.0 82.2 83.2  PLT 543* 547* 421*   Cardiac Enzymes: No results for input(s): CKTOTAL, CKMB, CKMBINDEX, TROPONINI in the last 168 hours. BNP (last 3 results) No results for input(s): BNP in the last 8760 hours.  ProBNP (last 3 results) No results for input(s): PROBNP in the last 8760 hours.  CBG:  Recent Labs Lab 02/19/15 2049 02/20/15 0014 02/20/15 0409 02/20/15 1117 02/20/15 1659  GLUCAP 133* 104* 111* 149* 85    No results found for this or any previous visit (from the past 240 hour(s)).   Studies: US Renal  02/20/2015   CLINICAL DATA:  Acute renal failure  EXAM: RENAL / URINARY TRACT ULTRASOUND COMPLETE  COMPARISON:  None.  FINDINGS: Right Kidney:  Length: 9.6 cm. Echogenicity within normal limits. No hydronephrosis. Right renal cysts are noted the largest in lower pole measures 2.8 cm.  Left Kidney:  Length: 9.2 cm in length. Normal echogenicity. No hydronephrosis. There is a cyst in upper pole measures 9 mm.  Bladder:  Appears normal for degree of bladder distention.  IMPRESSION: 1. No hydronephrosis. No renal calculi. A cyst in lower pole of the right kidney measures 2.8 cm.  Cyst in upper pole of the left kidney measures 9 mm. Unremarkable urinary bladder.   Electronically Signed   By: Lahoma Crocker M.D.   On: 02/20/2015 08:16   Dg Chest Port 1 View  02/19/2015   CLINICAL DATA:  Acute onset of weakness.  Initial encounter.  EXAM: PORTABLE CHEST - 1 VIEW  COMPARISON:  Chest radiograph performed 12/23/2004  FINDINGS: The lungs are well-aerated and clear. There is no evidence of focal opacification, pleural effusion or pneumothorax.  The cardiomediastinal silhouette is within normal limits. No acute osseous abnormalities are seen. The patient is status post right-sided rotator cuff repair.  IMPRESSION: No acute cardiopulmonary process seen.   Electronically Signed   By: Garald Balding M.D.   On: 02/19/2015 19:06    Scheduled Meds: . sodium chloride  10 mL/hr Intravenous Once  . amLODipine  10 mg Oral Daily  . aspirin EC  81 mg Oral Daily  . atorvastatin  40 mg Oral q1800  . cloNIDine  0.1 mg Oral BID  . feeding supplement (RESOURCE BREEZE)  1 Container Oral TID BM  . ferrous sulfate  325 mg Oral Q breakfast  . insulin aspart  0-9 Units Subcutaneous Q4H  . levothyroxine  125 mcg Oral QAC breakfast  . sodium chloride  3 mL Intravenous Q12H  . vitamin C  500 mg Oral Daily   Continuous Infusions: . sodium chloride 20 mL/hr at 02/20/15 0830    Principal Problem:   Symptomatic anemia Active Problems:   Diabetes   Hypertension associated with diabetes   Hypothyroidism   Hyperlipidemia associated with type 2 diabetes mellitus   Anemia   ARF (acute renal failure)    Time spent: 33 minutes    Armani Gawlik M.D. [facility] Pager [phone]. If 7PM-7AM, please contact night-coverage at [email or web address], password [facility] 02/20/2015, 8:20 PM  LOS: 1 day

## 2015-02-20 NOTE — Care Management Note (Signed)
Case Management Note  Patient Details  Name: [name] MRN: [id number] Date of Birth: June 19, 1934  Subjective/Objective:    Admitted with Anemia                Action/Plan: CM following for DCP  Expected Discharge Date:     02/23/2015             Expected Discharge Plan:   possibly [place]    Status of Service:   In progress  Sherrilyn Rist [phone] 02/20/2015, 1:12 PM

## 2015-02-20 NOTE — Consult Note (Signed)
Reason for Consult: Anemia Referring Physician: [facility]  Lovena Le Hada HPI: This is a 81 year old female with a PMH of a hepatic flexure AVM, IDA, HTN, hypothyroidism, and DM admitted with weakness.  She felt progressively weak over the past week and presented to Dr. Lanice Shirts office.  She was actually scheduled to see me yesterday, but she states that she went to the wrong office.  Dr. Redmond School checked her CBC and she was noted to have a drop in her HGB from the 9 range down to the 6 range.  She feels better after the blood transfusions.  On a routine basis she has black stools from her iron supplementation, but there was no reported increase in her bowel movements.  No complaints of chest pain or SOB.  She was in ARF, with a creatinine in the 2 range, but now it has dropped down to 1.7.  Past Medical History  Diagnosis Date  . Hypertension   . Obesity   . Arthritis   . Allergy     RHINITIS  . Hypothyroidism   . Hypercholesteremia   . Diabetes mellitus   . Anemia     Past Surgical History  Procedure Laterality Date  . Total thyroidectomy    . Fracture surgery      RIGHT SHOULDER   . Joint replacement      RIGHT KNEE & LEFT KNEE  . Colonoscopy N/A 08/30/2014    Procedure: COLONOSCOPY;  Surgeon: Beryle Beams, MD;  Location: [facility];  Service: Endoscopy;  Laterality: N/A;  . Hot hemostasis N/A 08/30/2014    Procedure: HOT HEMOSTASIS (ARGON PLASMA COAGULATION/BICAP);  Surgeon: Beryle Beams, MD;  Location: [facility];  Service: Endoscopy;  Laterality: N/A;    Family History  Problem Relation Age of Onset  . Anemia Sister     Social History:  reports that she has never smoked. She has never used smokeless tobacco. She reports that she does not drink alcohol or use illicit drugs.  Allergies: No Known Allergies  Medications:  Scheduled: . sodium chloride  10 mL/hr Intravenous Once  . amLODipine  10 mg Oral Daily  . aspirin EC  81 mg Oral Daily  .  atorvastatin  40 mg Oral q1800  . cloNIDine  0.1 mg Oral BID  . ferrous sulfate  325 mg Oral Q breakfast  . insulin aspart  0-9 Units Subcutaneous Q4H  . levothyroxine  125 mcg Oral QAC breakfast  . pioglitazone  30 mg Oral QAC breakfast  . sodium chloride  3 mL Intravenous Q12H  . vitamin C  500 mg Oral Daily   Continuous: . sodium chloride 100 mL/hr at 02/20/15 0024    Results for orders placed or performed during the hospital encounter of 02/19/15 (from the past 24 hour(s))  Type and screen     Status: None (Preliminary result)   Collection Time: 02/19/15  4:38 PM  Result Value Ref Range   ABO/RH(D) A POS    Antibody Screen NEG    Sample Expiration 02/22/2015    Unit Number [id number]    Blood Component Type RED CELLS,LR    Unit division 00    Status of Unit ISSUED    Transfusion Status OK TO TRANSFUSE    Crossmatch Result Compatible    Unit Number [id number]    Blood Component Type RED CELLS,LR    Unit division 00    Status of Unit ISSUED    Transfusion Status OK TO TRANSFUSE  Crossmatch Result Compatible   Comprehensive metabolic panel     Status: Abnormal   Collection Time: 02/19/15  4:38 PM  Result Value Ref Range   Sodium 135 135 - 145 mmol/L   Potassium 4.5 3.5 - 5.1 mmol/L   Chloride 102 101 - 111 mmol/L   CO2 18 (L) 22 - 32 mmol/L   Glucose, Bld 223 (H) 65 - 99 mg/dL   BUN 47 (H) 6 - 20 mg/dL   Creatinine, Ser 2.40 (H) 0.44 - 1.00 mg/dL   Calcium 8.8 (L) 8.9 - 10.3 mg/dL   Total Protein 6.6 6.5 - 8.1 g/dL   Albumin 3.4 (L) 3.5 - 5.0 g/dL   AST 22 15 - 41 U/L   ALT 9 (L) 14 - 54 U/L   Alkaline Phosphatase 56 38 - 126 U/L   Total Bilirubin 0.4 0.3 - 1.2 mg/dL   GFR calc non Af Amer 18 (L) >60 mL/min   GFR calc Af Amer 21 (L) >60 mL/min   Anion gap 15 5 - 15  CBC     Status: Abnormal   Collection Time: 02/19/15  4:38 PM  Result Value Ref Range   WBC 9.0 4.0 - 10.5 K/uL   RBC 2.59 (L) 3.87 - 5.11 MIL/uL   Hemoglobin 6.6 (LL) 12.0 - 15.0 g/dL    HCT 21.3 (L) 36.0 - 46.0 %   MCV 82.2 78.0 - 100.0 fL   MCH 25.5 (L) 26.0 - 34.0 pg   MCHC 31.0 30.0 - 36.0 g/dL   RDW 16.4 (H) 11.5 - 15.5 %   Platelets 547 (H) 150 - 400 K/uL  ABO/Rh     Status: None   Collection Time: 02/19/15  4:38 PM  Result Value Ref Range   ABO/RH(D) A POS   APTT     Status: None   Collection Time: 02/19/15  5:09 PM  Result Value Ref Range   aPTT 28 24 - 37 seconds  Protime-INR     Status: None   Collection Time: 02/19/15  5:09 PM  Result Value Ref Range   Prothrombin Time 14.6 11.6 - 15.2 seconds   INR 1.12 0.00 - 1.49  I-Stat CG4 Lactic Acid, ED     Status: Abnormal   Collection Time: 02/19/15  5:19 PM  Result Value Ref Range   Lactic Acid, Venous 2.51 (HH) 0.5 - 2.0 mmol/L   Comment NOTIFIED PHYSICIAN   Prepare RBC     Status: None   Collection Time: 02/19/15  6:16 PM  Result Value Ref Range   Order Confirmation ORDER PROCESSED BY BLOOD BANK   Urinalysis, Routine w reflex microscopic (not at [facility])     Status: Abnormal   Collection Time: 02/19/15  6:23 PM  Result Value Ref Range   Color, Urine YELLOW YELLOW   APPearance CLOUDY (A) CLEAR   Specific Gravity, Urine 1.018 1.005 - 1.030   pH 5.0 5.0 - 8.0   Glucose, UA NEGATIVE NEGATIVE mg/dL   Hgb urine dipstick NEGATIVE NEGATIVE   Bilirubin Urine NEGATIVE NEGATIVE   Ketones, ur 15 (A) NEGATIVE mg/dL   Protein, ur NEGATIVE NEGATIVE mg/dL   Urobilinogen, UA 0.2 0.0 - 1.0 mg/dL   Nitrite NEGATIVE NEGATIVE   Leukocytes, UA SMALL (A) NEGATIVE  Urine microscopic-add on     Status: Abnormal   Collection Time: 02/19/15  6:23 PM  Result Value Ref Range   Squamous Epithelial / LPF FEW (A) RARE   WBC, UA 3-6 <3 WBC/hpf  RBC / HPF 0-2 <3 RBC/hpf   Bacteria, UA RARE RARE   Casts HYALINE CASTS (A) NEGATIVE  Glucose, capillary     Status: Abnormal   Collection Time: 02/19/15  8:49 PM  Result Value Ref Range   Glucose-Capillary 133 (H) 65 - 99 mg/dL  Glucose, capillary     Status: Abnormal   Collection  Time: 02/20/15 12:14 AM  Result Value Ref Range   Glucose-Capillary 104 (H) 65 - 99 mg/dL  Sodium, urine, random     Status: None   Collection Time: 02/20/15 12:31 AM  Result Value Ref Range   Sodium, Ur 67 mmol/L  Comprehensive metabolic panel     Status: Abnormal   Collection Time: 02/20/15  3:42 AM  Result Value Ref Range   Sodium 138 135 - 145 mmol/L   Potassium 4.7 3.5 - 5.1 mmol/L   Chloride 108 101 - 111 mmol/L   CO2 20 (L) 22 - 32 mmol/L   Glucose, Bld 115 (H) 65 - 99 mg/dL   BUN 41 (H) 6 - 20 mg/dL   Creatinine, Ser 1.77 (H) 0.44 - 1.00 mg/dL   Calcium 8.4 (L) 8.9 - 10.3 mg/dL   Total Protein 6.0 (L) 6.5 - 8.1 g/dL   Albumin 3.0 (L) 3.5 - 5.0 g/dL   AST 21 15 - 41 U/L   ALT 8 (L) 14 - 54 U/L   Alkaline Phosphatase 52 38 - 126 U/L   Total Bilirubin 1.0 0.3 - 1.2 mg/dL   GFR calc non Af Amer 26 (L) >60 mL/min   GFR calc Af Amer 30 (L) >60 mL/min   Anion gap 10 5 - 15  CBC     Status: Abnormal   Collection Time: 02/20/15  3:42 AM  Result Value Ref Range   WBC 10.2 4.0 - 10.5 K/uL   RBC 3.34 (L) 3.87 - 5.11 MIL/uL   Hemoglobin 9.1 (L) 12.0 - 15.0 g/dL   HCT 27.8 (L) 36.0 - 46.0 %   MCV 83.2 78.0 - 100.0 fL   MCH 27.2 26.0 - 34.0 pg   MCHC 32.7 30.0 - 36.0 g/dL   RDW 15.1 11.5 - 15.5 %   Platelets 421 (H) 150 - 400 K/uL  Glucose, capillary     Status: Abnormal   Collection Time: 02/20/15  4:09 AM  Result Value Ref Range   Glucose-Capillary 111 (H) 65 - 99 mg/dL     Dg Chest Port 1 View  02/19/2015   CLINICAL DATA:  Acute onset of weakness.  Initial encounter.  EXAM: PORTABLE CHEST - 1 VIEW  COMPARISON:  Chest radiograph performed 12/23/2004  FINDINGS: The lungs are well-aerated and clear. There is no evidence of focal opacification, pleural effusion or pneumothorax.  The cardiomediastinal silhouette is within normal limits. No acute osseous abnormalities are seen. The patient is status post right-sided rotator cuff repair.  IMPRESSION: No acute cardiopulmonary process  seen.   Electronically Signed   By: Garald Balding M.D.   On: 02/19/2015 19:06    ROS:  As stated above in the HPI otherwise negative.  Blood pressure 126/50, pulse 73, temperature 98.4 F (36.9 C), temperature source Oral, resp. rate 18, height 4\' 11"  (1.499 m), weight 67.087 kg (147 lb 14.4 oz), SpO2 97 %.    PE: Gen: NAD, Alert and Oriented HEENT:  [place]/AT, EOMI Neck: Supple, no LAD Lungs: CTA Bilaterally CV: RRR without M/G/R ABM: Soft, NTND, +BS Ext: No C/C/E  Assessment/Plan: 1) Symptomatic anemia. 2) History  of colonic AVM. 3) ARF - improved.   I will repeat the colonoscopy tomorrow now that her creatinine has improved.  She is better with the blood transfusions and she was able to sleep well last evening.  Plan: 1) Colonoscopy tomorrow.  Myrtice Lowdermilk D 02/20/2015, 6:53 AM

## 2015-02-21 ENCOUNTER — Encounter: Payer: Self-pay | Admitting: Family Medicine

## 2015-02-21 ENCOUNTER — Encounter (HOSPITAL_COMMUNITY)
Admission: EM | Disposition: A | Discharge status: Home / Self Care | Payer: Self-pay | Source: Home / Self Care | Attending: Internal Medicine

## 2015-02-21 ENCOUNTER — Encounter (HOSPITAL_COMMUNITY): Payer: Self-pay | Admitting: *Deleted

## 2015-02-21 DIAGNOSIS — E119 Type 2 diabetes mellitus without complications: Secondary | ICD-10-CM | POA: Insufficient documentation

## 2015-02-21 DIAGNOSIS — E118 Type 2 diabetes mellitus with unspecified complications: Secondary | ICD-10-CM | POA: Insufficient documentation

## 2015-02-21 HISTORY — PX: COLONOSCOPY: SHX5424

## 2015-02-21 LAB — UIFE/LIGHT CHAINS/TP QN, 24-HR UR
% BETA, URINE: 39.3 %
ALPHA 1 URINE: 2.8 %
Albumin, U: 39.9 %
Alpha 2, Urine: 13.7 %
Free Kappa/Lambda Ratio: 10.08 (ref 2.04–10.37)
Free Lambda Lt Chains,Ur: 6.29 mg/L (ref 0.24–6.66)
Free Lt Chn Excr Rate: 63.4 mg/L — ABNORMAL HIGH (ref 1.35–24.19)
GAMMA GLOBULIN URINE: 4.4 %
Total Protein, Urine: 8.5 mg/dL

## 2015-02-21 LAB — GLUCOSE, CAPILLARY
Glucose-Capillary: 105 mg/dL — ABNORMAL HIGH (ref 65–99)
Glucose-Capillary: 111 mg/dL — ABNORMAL HIGH (ref 65–99)
Glucose-Capillary: 92 mg/dL (ref 65–99)

## 2015-02-21 LAB — CALCIUM / CREATININE RATIO, URINE
CALCIUM UR: 2.1 mg/dL
CALCIUM/CREAT. RATIO: 23 mg/g{creat} (ref 0–260)
CREATININE, UR: 93.2 mg/dL

## 2015-02-21 LAB — CBC
HEMATOCRIT: 28.9 % — AB (ref 36.0–46.0)
HEMOGLOBIN: 9.2 g/dL — AB (ref 12.0–15.0)
MCH: 26.7 pg (ref 26.0–34.0)
MCHC: 31.8 g/dL (ref 30.0–36.0)
MCV: 83.8 fL (ref 78.0–100.0)
PLATELETS: 435 10*3/uL — AB (ref 150–400)
RBC: 3.45 MIL/uL — AB (ref 3.87–5.11)
RDW: 15.8 % — AB (ref 11.5–15.5)
WBC: 7.9 10*3/uL (ref 4.0–10.5)

## 2015-02-21 LAB — PROTEIN ELECTROPHORESIS, SERUM
A/G Ratio: 1.1 (ref 0.7–2.0)
ALBUMIN ELP: 3.1 g/dL — AB (ref 3.2–5.6)
ALPHA-1-GLOBULIN: 0.3 g/dL (ref 0.1–0.4)
Alpha-2-Globulin: 0.8 g/dL (ref 0.4–1.2)
Beta Globulin: 0.9 g/dL (ref 0.6–1.3)
GLOBULIN, TOTAL: 2.7 g/dL (ref 2.0–4.5)
Gamma Globulin: 0.7 g/dL (ref 0.5–1.6)
TOTAL PROTEIN ELP: 5.8 g/dL — AB (ref 6.0–8.5)

## 2015-02-21 LAB — BASIC METABOLIC PANEL
Anion gap: 6 (ref 5–15)
BUN: 19 mg/dL (ref 6–20)
CALCIUM: 8.3 mg/dL — AB (ref 8.9–10.3)
CHLORIDE: 112 mmol/L — AB (ref 101–111)
CO2: 21 mmol/L — ABNORMAL LOW (ref 22–32)
CREATININE: 1.03 mg/dL — AB (ref 0.44–1.00)
GFR calc Af Amer: 58 mL/min — ABNORMAL LOW (ref >60)
GFR, EST NON AFRICAN AMERICAN: 50 mL/min — AB (ref >60)
GLUCOSE: 102 mg/dL — AB (ref 65–99)
Potassium: 4.3 mmol/L (ref 3.5–5.1)
Sodium: 139 mmol/L (ref 135–145)

## 2015-02-21 LAB — HEMOGLOBIN A1C
HEMOGLOBIN A1C: 6.1 % — AB (ref 4.8–5.6)
MEAN PLASMA GLUCOSE: 128 mg/dL

## 2015-02-21 SURGERY — COLONOSCOPY
Anesthesia: Moderate Sedation

## 2015-02-21 MED ORDER — POTASSIUM CHLORIDE CRYS ER 10 MEQ PO TBCR: EXTENDED_RELEASE_TABLET | ORAL | Status: DC

## 2015-02-21 MED ORDER — FERROUS SULFATE 325 (65 FE) MG PO TABS: 325.0000 mg | ORAL_TABLET | Freq: Every day | ORAL | Status: DC

## 2015-02-21 MED ORDER — FENTANYL CITRATE (PF) 100 MCG/2ML IJ SOLN
INTRAMUSCULAR | Status: DC | PRN
  Administered 2015-02-21 (×2): 25 ug via INTRAVENOUS

## 2015-02-21 MED ORDER — FUROSEMIDE 20 MG PO TABS: ORAL_TABLET | ORAL | Status: DC

## 2015-02-21 MED ORDER — BENAZEPRIL-HYDROCHLOROTHIAZIDE 20-25 MG PO TABS: 1.0000 | ORAL_TABLET | Freq: Every day | ORAL | Status: DC

## 2015-02-21 MED ORDER — MIDAZOLAM HCL 5 MG/5ML IJ SOLN
INTRAMUSCULAR | Status: DC | PRN
  Administered 2015-02-21: 2 mg via INTRAVENOUS
  Administered 2015-02-21: 1 mg via INTRAVENOUS
  Administered 2015-02-21: 2 mg via INTRAVENOUS

## 2015-02-21 NOTE — Interval H&P Note (Signed)
History and Physical Interval Note:  02/21/2015 8:05 AM  Connie [name]  has presented today for surgery, with the diagnosis of Anemia and history of hepatic flexure AVM.  The various methods of treatment have been discussed with the patient and family. After consideration of risks, benefits and other options for treatment, the patient has consented to  Procedure(s): COLONOSCOPY (N/A) as a surgical intervention .  The patient's history has been reviewed, patient examined, no change in status, stable for surgery.  I have reviewed the patient's chart and labs.  Questions were answered to the patient's satisfaction.     Akela Pocius D

## 2015-02-21 NOTE — H&P (View-Only) (Signed)
Reason for Consult: Anemia Referring Physician: [facility]  Lovena Le Bramlett HPI: This is a 81 year old female with a PMH of a hepatic flexure AVM, IDA, HTN, hypothyroidism, and DM admitted with weakness.  She felt progressively weak over the past week and presented to Dr. Lanice Shirts office.  She was actually scheduled to see me yesterday, but she states that she went to the wrong office.  Dr. Redmond School checked her CBC and she was noted to have a drop in her HGB from the 9 range down to the 6 range.  She feels better after the blood transfusions.  On a routine basis she has black stools from her iron supplementation, but there was no reported increase in her bowel movements.  No complaints of chest pain or SOB.  She was in ARF, with a creatinine in the 2 range, but now it has dropped down to 1.7.  Past Medical History  Diagnosis Date  . Hypertension   . Obesity   . Arthritis   . Allergy     RHINITIS  . Hypothyroidism   . Hypercholesteremia   . Diabetes mellitus   . Anemia     Past Surgical History  Procedure Laterality Date  . Total thyroidectomy    . Fracture surgery      RIGHT SHOULDER   . Joint replacement      RIGHT KNEE & LEFT KNEE  . Colonoscopy N/A 08/30/2014    Procedure: COLONOSCOPY;  Surgeon: Beryle Beams, MD;  Location: [facility];  Service: Endoscopy;  Laterality: N/A;  . Hot hemostasis N/A 08/30/2014    Procedure: HOT HEMOSTASIS (ARGON PLASMA COAGULATION/BICAP);  Surgeon: Beryle Beams, MD;  Location: [facility];  Service: Endoscopy;  Laterality: N/A;    Family History  Problem Relation Age of Onset  . Anemia Sister     Social History:  reports that she has never smoked. She has never used smokeless tobacco. She reports that she does not drink alcohol or use illicit drugs.  Allergies: No Known Allergies  Medications:  Scheduled: . sodium chloride  10 mL/hr Intravenous Once  . amLODipine  10 mg Oral Daily  . aspirin EC  81 mg Oral Daily  .  atorvastatin  40 mg Oral q1800  . cloNIDine  0.1 mg Oral BID  . ferrous sulfate  325 mg Oral Q breakfast  . insulin aspart  0-9 Units Subcutaneous Q4H  . levothyroxine  125 mcg Oral QAC breakfast  . pioglitazone  30 mg Oral QAC breakfast  . sodium chloride  3 mL Intravenous Q12H  . vitamin C  500 mg Oral Daily   Continuous: . sodium chloride 100 mL/hr at 02/20/15 0024    Results for orders placed or performed during the hospital encounter of 02/19/15 (from the past 24 hour(s))  Type and screen     Status: None (Preliminary result)   Collection Time: 02/19/15  4:38 PM  Result Value Ref Range   ABO/RH(D) A POS    Antibody Screen NEG    Sample Expiration 02/22/2015    Unit Number [id number]    Blood Component Type RED CELLS,LR    Unit division 00    Status of Unit ISSUED    Transfusion Status OK TO TRANSFUSE    Crossmatch Result Compatible    Unit Number [id number]    Blood Component Type RED CELLS,LR    Unit division 00    Status of Unit ISSUED    Transfusion Status OK TO TRANSFUSE  Crossmatch Result Compatible   Comprehensive metabolic panel     Status: Abnormal   Collection Time: 02/19/15  4:38 PM  Result Value Ref Range   Sodium 135 135 - 145 mmol/L   Potassium 4.5 3.5 - 5.1 mmol/L   Chloride 102 101 - 111 mmol/L   CO2 18 (L) 22 - 32 mmol/L   Glucose, Bld 223 (H) 65 - 99 mg/dL   BUN 47 (H) 6 - 20 mg/dL   Creatinine, Ser 2.40 (H) 0.44 - 1.00 mg/dL   Calcium 8.8 (L) 8.9 - 10.3 mg/dL   Total Protein 6.6 6.5 - 8.1 g/dL   Albumin 3.4 (L) 3.5 - 5.0 g/dL   AST 22 15 - 41 U/L   ALT 9 (L) 14 - 54 U/L   Alkaline Phosphatase 56 38 - 126 U/L   Total Bilirubin 0.4 0.3 - 1.2 mg/dL   GFR calc non Af Amer 18 (L) >60 mL/min   GFR calc Af Amer 21 (L) >60 mL/min   Anion gap 15 5 - 15  CBC     Status: Abnormal   Collection Time: 02/19/15  4:38 PM  Result Value Ref Range   WBC 9.0 4.0 - 10.5 K/uL   RBC 2.59 (L) 3.87 - 5.11 MIL/uL   Hemoglobin 6.6 (LL) 12.0 - 15.0 g/dL    HCT 21.3 (L) 36.0 - 46.0 %   MCV 82.2 78.0 - 100.0 fL   MCH 25.5 (L) 26.0 - 34.0 pg   MCHC 31.0 30.0 - 36.0 g/dL   RDW 16.4 (H) 11.5 - 15.5 %   Platelets 547 (H) 150 - 400 K/uL  ABO/Rh     Status: None   Collection Time: 02/19/15  4:38 PM  Result Value Ref Range   ABO/RH(D) A POS   APTT     Status: None   Collection Time: 02/19/15  5:09 PM  Result Value Ref Range   aPTT 28 24 - 37 seconds  Protime-INR     Status: None   Collection Time: 02/19/15  5:09 PM  Result Value Ref Range   Prothrombin Time 14.6 11.6 - 15.2 seconds   INR 1.12 0.00 - 1.49  I-Stat CG4 Lactic Acid, ED     Status: Abnormal   Collection Time: 02/19/15  5:19 PM  Result Value Ref Range   Lactic Acid, Venous 2.51 (HH) 0.5 - 2.0 mmol/L   Comment NOTIFIED PHYSICIAN   Prepare RBC     Status: None   Collection Time: 02/19/15  6:16 PM  Result Value Ref Range   Order Confirmation ORDER PROCESSED BY BLOOD BANK   Urinalysis, Routine w reflex microscopic (not at [facility])     Status: Abnormal   Collection Time: 02/19/15  6:23 PM  Result Value Ref Range   Color, Urine YELLOW YELLOW   APPearance CLOUDY (A) CLEAR   Specific Gravity, Urine 1.018 1.005 - 1.030   pH 5.0 5.0 - 8.0   Glucose, UA NEGATIVE NEGATIVE mg/dL   Hgb urine dipstick NEGATIVE NEGATIVE   Bilirubin Urine NEGATIVE NEGATIVE   Ketones, ur 15 (A) NEGATIVE mg/dL   Protein, ur NEGATIVE NEGATIVE mg/dL   Urobilinogen, UA 0.2 0.0 - 1.0 mg/dL   Nitrite NEGATIVE NEGATIVE   Leukocytes, UA SMALL (A) NEGATIVE  Urine microscopic-add on     Status: Abnormal   Collection Time: 02/19/15  6:23 PM  Result Value Ref Range   Squamous Epithelial / LPF FEW (A) RARE   WBC, UA 3-6 <3 WBC/hpf  RBC / HPF 0-2 <3 RBC/hpf   Bacteria, UA RARE RARE   Casts HYALINE CASTS (A) NEGATIVE  Glucose, capillary     Status: Abnormal   Collection Time: 02/19/15  8:49 PM  Result Value Ref Range   Glucose-Capillary 133 (H) 65 - 99 mg/dL  Glucose, capillary     Status: Abnormal   Collection  Time: 02/20/15 12:14 AM  Result Value Ref Range   Glucose-Capillary 104 (H) 65 - 99 mg/dL  Sodium, urine, random     Status: None   Collection Time: 02/20/15 12:31 AM  Result Value Ref Range   Sodium, Ur 67 mmol/L  Comprehensive metabolic panel     Status: Abnormal   Collection Time: 02/20/15  3:42 AM  Result Value Ref Range   Sodium 138 135 - 145 mmol/L   Potassium 4.7 3.5 - 5.1 mmol/L   Chloride 108 101 - 111 mmol/L   CO2 20 (L) 22 - 32 mmol/L   Glucose, Bld 115 (H) 65 - 99 mg/dL   BUN 41 (H) 6 - 20 mg/dL   Creatinine, Ser 1.77 (H) 0.44 - 1.00 mg/dL   Calcium 8.4 (L) 8.9 - 10.3 mg/dL   Total Protein 6.0 (L) 6.5 - 8.1 g/dL   Albumin 3.0 (L) 3.5 - 5.0 g/dL   AST 21 15 - 41 U/L   ALT 8 (L) 14 - 54 U/L   Alkaline Phosphatase 52 38 - 126 U/L   Total Bilirubin 1.0 0.3 - 1.2 mg/dL   GFR calc non Af Amer 26 (L) >60 mL/min   GFR calc Af Amer 30 (L) >60 mL/min   Anion gap 10 5 - 15  CBC     Status: Abnormal   Collection Time: 02/20/15  3:42 AM  Result Value Ref Range   WBC 10.2 4.0 - 10.5 K/uL   RBC 3.34 (L) 3.87 - 5.11 MIL/uL   Hemoglobin 9.1 (L) 12.0 - 15.0 g/dL   HCT 27.8 (L) 36.0 - 46.0 %   MCV 83.2 78.0 - 100.0 fL   MCH 27.2 26.0 - 34.0 pg   MCHC 32.7 30.0 - 36.0 g/dL   RDW 15.1 11.5 - 15.5 %   Platelets 421 (H) 150 - 400 K/uL  Glucose, capillary     Status: Abnormal   Collection Time: 02/20/15  4:09 AM  Result Value Ref Range   Glucose-Capillary 111 (H) 65 - 99 mg/dL     Dg Chest Port 1 View  02/19/2015   CLINICAL DATA:  Acute onset of weakness.  Initial encounter.  EXAM: PORTABLE CHEST - 1 VIEW  COMPARISON:  Chest radiograph performed 12/23/2004  FINDINGS: The lungs are well-aerated and clear. There is no evidence of focal opacification, pleural effusion or pneumothorax.  The cardiomediastinal silhouette is within normal limits. No acute osseous abnormalities are seen. The patient is status post right-sided rotator cuff repair.  IMPRESSION: No acute cardiopulmonary process  seen.   Electronically Signed   By: Garald Balding M.D.   On: 02/19/2015 19:06    ROS:  As stated above in the HPI otherwise negative.  Blood pressure 126/50, pulse 73, temperature 98.4 F (36.9 C), temperature source Oral, resp. rate 18, height 4\' 11"  (1.499 m), weight 67.087 kg (147 lb 14.4 oz), SpO2 97 %.    PE: Gen: NAD, Alert and Oriented HEENT:  [place]/AT, EOMI Neck: Supple, no LAD Lungs: CTA Bilaterally CV: RRR without M/G/R ABM: Soft, NTND, +BS Ext: No C/C/E  Assessment/Plan: 1) Symptomatic anemia. 2) History  of colonic AVM. 3) ARF - improved.   I will repeat the colonoscopy tomorrow now that her creatinine has improved.  She is better with the blood transfusions and she was able to sleep well last evening.  Plan: 1) Colonoscopy tomorrow.  Kimberly Coye D 02/20/2015, 6:53 AM

## 2015-02-21 NOTE — Discharge Summary (Signed)
Physician Discharge Summary  Connie [name] DTO:[id number] DOB: 02-25-1934 DOA: 02/19/2015  PCP: Wyatt Haste, MD  Admit date: 02/19/2015 Discharge date: 02/21/2015  Time spent: 65 minutes  Recommendations for Outpatient Follow-up:  1. Follow-up with Dr. Benson Norway of gastroenterology in 2 weeks.  2. Follow-up with Wyatt Haste, MD in about 1 week. On follow-up patient needs a CBC done to follow-up on H&H. Patient also need a basic metabolic profile done to follow-up on electrolytes and renal function.  Discharge Diagnoses:  Principal Problem:   Symptomatic anemia Active Problems:   Diabetes   Hypertension associated with diabetes   Hypothyroidism   Hyperlipidemia associated with type 2 diabetes mellitus   Anemia   ARF (acute renal failure)   Discharge Condition: Stable and improved  Diet recommendation: Carb modified/heart healthy  Filed Weights   02/19/15 2005 02/20/15 0410 02/21/15 0412  Weight: 66.906 kg (147 lb 8 oz) 67.087 kg (147 lb 14.4 oz) 68.584 kg (151 lb 3.2 oz)    History of present illness:  Per Dr. Jani Gravel 81 yo female with dm2, hyperlipidemia, hypothyroidism, apparently c/o lightheadedness x 1 weeks, and not feeling well. Pt haD not been eating well. Decrease in po intake. Pt felt like she was anemic, and therefore presented to pcp office and was noted to be anemic, with hgb 6.6 and sent to ED for evaluation. Pt noted to be anemic, and in ARF with lactic acidosis. Due to metformin.    Hospital Course:  #1 symptomatic anemia Patient had presented with symptomatic anemia with lightheadedness and noted to have a hemoglobin of 6.6 at the PCPs office. Patient was admitted placed on telemetry place on IV fluids and a GI consultation obtained. Patient with prior history of colonic AVMs status post ablation December 2015. Patient with symptomatic improvement. Patient was transfused 2 units packed red blood cells with appropriate response in a  hemoglobin to 9.2 by day of discharge. Patient subsequently underwent a colonoscopy by Dr. Benson Norway on 02/21/2015 which showed proximal colon AVM and other suspicious vascular lesions and diverticula. Was recommended that patient follow-up with gastroenterology in 2 weeks. Patient remained in stable and improved condition and patient be discharged home.   #2 acute renal failure Likely secondary to prerenal azotemia. Urine sodium of 67. Renal ultrasound negative for hydronephrosis. ACE inhibitor and diuretics were held. Patient was hydrated gently during the hospitalization. Patient's metformin was held. Patient's renal function improved such that by day of discharge patient's creatinine was 1.03. Patient had good urine output. Patient has been instructed not to resume ACE inhibitor or diuretic for about 2-3 days. Patient will follow-up with PCP as outpatient.  #3 type 2 diabetes Patient was maintained on a sliding scale insulin throughout the hospitalization.   #4 hypertension Stable. Continued on Norvasc and clonidine. Patient's ACE inhibitor and direct were held secondary to acute renal failure. Patient's blood pressure remained stable. Outpatient follow-up.  #5 hypothyroidism Continued on home regimen of Synthroid.   Procedures:  Renal ultrasound 02/20/2015  Chest x-ray 02/19/2015  2 units packed red blood cells 02/19/2015  Colonoscopy 02/21/2015 Dr. Benson Norway  Consultations:  Gastroenterology: Dr. Benson Norway 02/20/2015  Discharge Exam: Filed Vitals:   02/21/15 0900  BP: 128/48  Pulse: 65  Temp:   Resp: 14    General: NAD Cardiovascular: RRR Respiratory: CTAB  Discharge Instructions   Discharge Instructions    Diet Carb Modified    Complete by:  As directed      Discharge instructions    Complete by:  As directed   Follow up with Wyatt Haste, MD as scheduled. Follow up with Dr Benson Norway in 2 weeks.     Increase activity slowly    Complete by:  As directed            Current Discharge Medication List    START taking these medications   Details  ferrous sulfate 325 (65 FE) MG tablet Take 1 tablet (325 mg total) by mouth daily with breakfast.      CONTINUE these medications which have CHANGED   Details  benazepril-hydrochlorthiazide (LOTENSIN HCT) 20-25 MG per tablet Take 1 tablet by mouth daily. Resume in 2-3 days   Associated Diagnoses: Essential hypertension, benign    furosemide (LASIX) 20 MG tablet TAKE ONE TABLET BY MOUTH EVERY DAY. Resume in 2-3 days Qty: 90 tablet, Refills: 0    potassium chloride (KLOR-CON M10) 10 MEQ tablet TAKE ONE TABLET BY MOUTH TWICE DAILY Resume in 2-3 days when you start lasix      CONTINUE these medications which have NOT CHANGED   Details  amLODipine (NORVASC) 10 MG tablet TAKE ONE TABLET BY MOUTH EVERY DAY Qty: 90 tablet, Refills: 3    aspirin 81 MG tablet Take 81 mg by mouth daily.      atorvastatin (LIPITOR) 40 MG tablet TAKE ONE TABLET BY MOUTH ONCE DAILY Qty: 90 tablet, Refills: 3   Associated Diagnoses: Hyperlipidemia associated with type 2 diabetes mellitus    cloNIDine (CATAPRES) 0.1 MG tablet TAKE ONE TABLET BY MOUTH TWICE DAILY Qty: 180 tablet, Refills: 3   Associated Diagnoses: Hypertension associated with diabetes    Iron-Vitamin C 65-125 MG TABS Take 1 tablet by mouth daily.    levothyroxine (SYNTHROID, LEVOTHROID) 125 MCG tablet TAKE ONE TABLET BY MOUTH EVERY DAY Qty: 90 tablet, Refills: 3    metFORMIN (GLUCOPHAGE) 850 MG tablet Take 1 tablet (850 mg total) by mouth 2 (two) times daily with a meal. Qty: 180 tablet, Refills: 0   Associated Diagnoses: Type 2 diabetes mellitus with diabetic cataract    pioglitazone (ACTOS) 30 MG tablet Take 1 tablet (30 mg total) by mouth daily. Qty: 90 tablet, Refills: 1    glucose blood test strip onetouch verio test strip patient is to test one time a day Qty: 100 each, Refills: 4    ONETOUCH DELICA LANCETS FINE MISC 1 each by Does not apply  route daily. Qty: 100 each, Refills: 4    triamcinolone cream (KENALOG) 0.1 % Apply 1 application topically 2 (two) times daily. Qty: 30 g, Refills: 0   Associated Diagnoses: Dermatitis       No Known Allergies Follow-up Information    Follow up with Wyatt Haste, MD.   Specialty:  Family Medicine   Why:  Appointment June 9th 11;30am   Contact information:   [address] [place] [address] [phone]       Follow up with Beryle Beams, MD In 2 weeks.   Specialty:  Gastroenterology   Why:  office will call with appointment time   Contact information:   [place], [place] [address] [phone]        [name] (including imaging, microbiology, ancillary and laboratory) are listed below for reference.    Significant Diagnostic Studies: US Renal  02/20/2015   CLINICAL DATA:  Acute renal failure  EXAM: RENAL / URINARY TRACT ULTRASOUND COMPLETE  COMPARISON:  None.  FINDINGS: Right Kidney:  Length: 9.6 cm. Echogenicity within normal  limits. No hydronephrosis. Right renal cysts are noted the largest in lower pole measures 2.8 cm.  Left Kidney:  Length: 9.2 cm in length. Normal echogenicity. No hydronephrosis. There is a cyst in upper pole measures 9 mm.  Bladder:  Appears normal for degree of bladder distention.  IMPRESSION: 1. No hydronephrosis. No renal calculi. A cyst in lower pole of the right kidney measures 2.8 cm. Cyst in upper pole of the left kidney measures 9 mm. Unremarkable urinary bladder.   Electronically Signed   By: Lahoma Crocker M.D.   On: 02/20/2015 08:16   Dg Chest Port 1 View  02/19/2015   CLINICAL DATA:  Acute onset of weakness.  Initial encounter.  EXAM: PORTABLE CHEST - 1 VIEW  COMPARISON:  Chest radiograph performed 12/23/2004  FINDINGS: The lungs are well-aerated and clear. There is no evidence of focal opacification, pleural effusion or pneumothorax.  The  cardiomediastinal silhouette is within normal limits. No acute osseous abnormalities are seen. The patient is status post right-sided rotator cuff repair.  IMPRESSION: No acute cardiopulmonary process seen.   Electronically Signed   By: Garald Balding M.D.   On: 02/19/2015 19:06    Microbiology: No results found for this or any previous visit (from the past 240 hour(s)).   Labs: Basic Metabolic Panel:  Recent Labs Lab 02/19/15 0001 02/19/15 1638 02/20/15 0342 02/21/15 0437  NA 137 135 138 139  K 5.0 4.5 4.7 4.3  CL 103 102 108 112*  CO2 20 18* 20* 21*  GLUCOSE 115* 223* 115* 102*  BUN 46* 47* 41* 19  CREATININE 1.98* 2.40* 1.77* 1.03*  CALCIUM 9.0 8.8* 8.4* 8.3*   Liver Function Tests:  Recent Labs Lab 02/19/15 1638 02/20/15 0342  AST 22 21  ALT 9* 8*  ALKPHOS 56 52  BILITOT 0.4 1.0  PROT 6.6 6.0*  ALBUMIN 3.4* 3.0*   No results for input(s): LIPASE, AMYLASE in the last 168 hours. No results for input(s): AMMONIA in the last 168 hours. CBC:  Recent Labs Lab 02/19/15 0001 02/19/15 1638 02/20/15 0342 02/21/15 0437  WBC 9.1 9.0 10.2 7.9  NEUTROABS 6.0  --   --   --   HGB 6.8* 6.6* 9.1* 9.2*  HCT 21.1* 21.3* 27.8* 28.9*  MCV 79.0 82.2 83.2 83.8  PLT 543* 547* 421* 435*   Cardiac Enzymes: No results for input(s): CKTOTAL, CKMB, CKMBINDEX, TROPONINI in the last 168 hours. BNP: BNP (last 3 results) No results for input(s): BNP in the last 8760 hours.  ProBNP (last 3 results) No results for input(s): PROBNP in the last 8760 hours.  CBG:  Recent Labs Lab 02/20/15 1659 02/20/15 2117 02/21/15 0057 02/21/15 0411 02/21/15 1206  GLUCAP 85 95 105* 92 111*       Signed:  Claudeen Leason M.D. [facility] 02/21/2015, 1:58 PM

## 2015-02-21 NOTE — Op Note (Signed)
[facility] [place], [address]   COLONOSCOPY PROCEDURE REPORT  PATIENT: Connie [name], Connie [name]  MR#: [id number] BIRTHDATE: 09-Sep-1934 , 71  yrs. old GENDER: female ENDOSCOPIST: Carol Ada, MD REFERRED BY: PROCEDURE DATE:  March 04, 2015 PROCEDURE:   Colonoscopy with control of bleeding ASA CLASS:   Class III INDICATIONS: Anemia and prior history of colonic AVMs. MEDICATIONS: Versed 5 mg IV and Fentanyl 50 mcg IV  DESCRIPTION OF PROCEDURE:   After the risks and benefits and of the procedure were explained, informed consent was obtained.  revealed no abnormalities of the rectum.    The Pentax Adult Colon [id number] endoscope was introduced through the anus and advanced to the terminal ileum which was intubated for a short distance .  The quality of the prep was excellent. .  The instrument was then slowly withdrawn as the colon was fully examined. Estimated blood loss is zero unless otherwise noted in this procedure report.  FINDINGS: In the cecum there was a possible AVM.  I was not certain if this was from scope trauma, but oozing from the site persisted. APC was applied to the area.  In the proximal ascending colon additional vascular abnormalities were noted.  These were not typical for AVMs.  APC was applied to all suspicious sites. Scattered diverticula were noted in the left side of the colon.  No evidence of any inflammation, ulcerations, erosions, polyps, or masses.            The scope was then withdrawn from the patient and the procedure completed.  WITHDRAWAL TIME: 22 minutes.  COMPLICATIONS: There were no immediate complications. ENDOSCOPIC IMPRESSION: 1) Proximal colon AVM and other suspicious vascular lesions. 2) Diverticula.  RECOMMENDATIONS: 1) Follow HGB. 2) Follow up in the office in 2 weeks.  I will make the arrangements. 3) Okay to D/C home from the GI standpoint as her HGB is stable.  REPEAT EXAM: cc:     _______________________________ eSignedCarol Ada, MD 03-04-15 9:17 AM   CPT CODES: ICD CODES:  The ICD and CPT codes recommended by this software are interpretations from the data that the clinical staff has captured with the software.  The verification of the translation of this report to the ICD and CPT codes and modifiers is the sole responsibility of the health care institution and practicing physician where this report was generated.  [employer] will not be held responsible for the validity of the ICD and CPT codes included on this report.  AMA assumes no liability for data contained or not contained herein. CPT is a Designer, television/film set of the [employer].   PATIENT NAME:  Connie [name], Connie [name] MR#: [id number]

## 2015-02-24 ENCOUNTER — Encounter (HOSPITAL_COMMUNITY): Payer: Self-pay | Admitting: Gastroenterology

## 2015-02-24 ENCOUNTER — Telehealth: Payer: Self-pay | Admitting: Family Medicine

## 2015-02-24 NOTE — Telephone Encounter (Signed)
Pt called and stated that due to stockings and blood draws done while in hospital, pt is itching. She would like to know something OTC she can take. She does have an appt to see you this week.

## 2015-02-24 NOTE — Telephone Encounter (Signed)
Patient has been advised and verbalized understanding.

## 2015-02-24 NOTE — Telephone Encounter (Signed)
She can take Benadryl

## 2015-02-27 ENCOUNTER — Ambulatory Visit (INDEPENDENT_AMBULATORY_CARE_PROVIDER_SITE_OTHER): Payer: Commercial Managed Care - HMO | Admitting: Family Medicine

## 2015-02-27 ENCOUNTER — Encounter: Payer: Self-pay | Admitting: Family Medicine

## 2015-02-27 VITALS — BP 100/70 | HR 66 | Wt 148.0 lb

## 2015-02-27 DIAGNOSIS — R899 Unspecified abnormal finding in specimens from other organs, systems and tissues: Secondary | ICD-10-CM

## 2015-02-27 DIAGNOSIS — R42 Dizziness and giddiness: Secondary | ICD-10-CM | POA: Diagnosis not present

## 2015-02-27 DIAGNOSIS — Q2733 Arteriovenous malformation of digestive system vessel: Secondary | ICD-10-CM

## 2015-02-27 DIAGNOSIS — D62 Acute posthemorrhagic anemia: Secondary | ICD-10-CM | POA: Diagnosis not present

## 2015-02-27 DIAGNOSIS — K5520 Angiodysplasia of colon without hemorrhage: Secondary | ICD-10-CM

## 2015-02-27 LAB — COMPREHENSIVE METABOLIC PANEL
ALK PHOS: 61 U/L (ref 39–117)
AST: 15 U/L (ref 0–37)
Albumin: 3.7 g/dL (ref 3.5–5.2)
BUN: 27 mg/dL — ABNORMAL HIGH (ref 6–23)
CO2: 21 meq/L (ref 19–32)
CREATININE: 1.44 mg/dL — AB (ref 0.50–1.10)
Calcium: 9.1 mg/dL (ref 8.4–10.5)
Chloride: 104 mEq/L (ref 96–112)
Glucose, Bld: 145 mg/dL — ABNORMAL HIGH (ref 70–99)
POTASSIUM: 4.7 meq/L (ref 3.5–5.3)
SODIUM: 137 meq/L (ref 135–145)
Total Bilirubin: 0.5 mg/dL (ref 0.2–1.2)
Total Protein: 6.6 g/dL (ref 6.0–8.3)

## 2015-02-27 LAB — CBC WITH DIFFERENTIAL/PLATELET
Basophils Absolute: 0.1 10*3/uL (ref 0.0–0.1)
Basophils Relative: 1 % (ref 0–1)
Eosinophils Absolute: 0.8 10*3/uL — ABNORMAL HIGH (ref 0.0–0.7)
Eosinophils Relative: 11 % — ABNORMAL HIGH (ref 0–5)
HCT: 30.2 % — ABNORMAL LOW (ref 36.0–46.0)
Hemoglobin: 9.7 g/dL — ABNORMAL LOW (ref 12.0–15.0)
Lymphocytes Relative: 15 % (ref 12–46)
Lymphs Abs: 1.1 10*3/uL (ref 0.7–4.0)
MCH: 26.6 pg (ref 26.0–34.0)
MCHC: 32.1 g/dL (ref 30.0–36.0)
MCV: 82.7 fL (ref 78.0–100.0)
MONO ABS: 0.6 10*3/uL (ref 0.1–1.0)
MPV: 9.4 fL (ref 8.6–12.4)
Monocytes Relative: 9 % (ref 3–12)
NEUTROS ABS: 4.6 10*3/uL (ref 1.7–7.7)
Neutrophils Relative %: 64 % (ref 43–77)
PLATELETS: 471 10*3/uL — AB (ref 150–400)
RBC: 3.65 MIL/uL — ABNORMAL LOW (ref 3.87–5.11)
RDW: 16 % — ABNORMAL HIGH (ref 11.5–15.5)
WBC: 7.2 10*3/uL (ref 4.0–10.5)

## 2015-02-27 NOTE — Progress Notes (Signed)
   Subjective:    Patient ID: Connie [name], female    DOB: 06-21-1934, 81 y.o.   MRN: [id number]  HPI She is here for follow-up visit after recent hospitalization for acute blood loss. She has a previous history of colonic AVM. This was a given giving her difficulty. She had a repeat colonoscopy. Also she had some abnormal laboratory tests including low protein and kidney function abnormality. Repeat blood work prior to leaving the hospital did show an improvement in her renal function. Presently she is mainly complaining of slight dizziness. She presently is on a iron supplement that is apparently more gentle on her stomach.She does note continued black stools.  Review of Systems     Objective:   Physical Exam Alert and in no distress with good color. Cardiac exam shows regular rhythm without murmurs or gallops. Lungs are clear to auscultation.       Assessment & Plan:  Dizzy  AVM (arteriovenous malformation) of colon  Acute blood loss anemia - Plan: CBC with Differential/Platelet  Abnormal laboratory test - Plan: CBC with Differential/Platelet, Comprehensive metabolic panel

## 2015-02-28 ENCOUNTER — Other Ambulatory Visit: Payer: Self-pay

## 2015-02-28 DIAGNOSIS — E878 Other disorders of electrolyte and fluid balance, not elsewhere classified: Secondary | ICD-10-CM

## 2015-03-03 ENCOUNTER — Other Ambulatory Visit: Payer: Commercial Managed Care - HMO

## 2015-03-03 ENCOUNTER — Other Ambulatory Visit: Payer: Self-pay | Admitting: Family Medicine

## 2015-03-03 DIAGNOSIS — K5730 Diverticulosis of large intestine without perforation or abscess without bleeding: Secondary | ICD-10-CM | POA: Diagnosis not present

## 2015-03-03 DIAGNOSIS — K5521 Angiodysplasia of colon with hemorrhage: Secondary | ICD-10-CM | POA: Diagnosis not present

## 2015-03-03 DIAGNOSIS — E878 Other disorders of electrolyte and fluid balance, not elsewhere classified: Secondary | ICD-10-CM

## 2015-03-03 DIAGNOSIS — D500 Iron deficiency anemia secondary to blood loss (chronic): Secondary | ICD-10-CM | POA: Diagnosis not present

## 2015-03-03 LAB — ELECTROLYTE PANEL
CHLORIDE: 105 meq/L (ref 96–112)
CO2: 20 meq/L (ref 19–32)
Potassium: 5.1 mEq/L (ref 3.5–5.3)
Sodium: 138 mEq/L (ref 135–145)

## 2015-03-04 ENCOUNTER — Telehealth: Payer: Self-pay

## 2015-03-04 ENCOUNTER — Other Ambulatory Visit: Payer: Self-pay

## 2015-03-04 DIAGNOSIS — R944 Abnormal results of kidney function studies: Secondary | ICD-10-CM

## 2015-03-04 LAB — RENAL FUNCTION PANEL
ALBUMIN: 3.9 g/dL (ref 3.5–5.2)
BUN: 34 mg/dL — ABNORMAL HIGH (ref 6–23)
CALCIUM: 9.1 mg/dL (ref 8.4–10.5)
CO2: 18 meq/L — AB (ref 19–32)
CREATININE: 1.47 mg/dL — AB (ref 0.50–1.10)
Chloride: 105 mEq/L (ref 96–112)
Glucose, Bld: 91 mg/dL (ref 70–99)
PHOSPHORUS: 4.4 mg/dL (ref 2.3–4.6)
Potassium: 5.1 mEq/L (ref 3.5–5.3)
SODIUM: 138 meq/L (ref 135–145)

## 2015-03-04 NOTE — Telephone Encounter (Signed)
Pt i Has been informed and verbalized understanding

## 2015-03-04 NOTE — Telephone Encounter (Signed)
Let her know that she has a benign cyst on the kidneys which is not an issue. She can take either Allegra or Claritin during the day and Benadryl at night also use cortisone cream on the areas that itch a lot

## 2015-03-04 NOTE — Telephone Encounter (Signed)
Dr.Lalonde patients itching has not stoped and bendryl is not helping much its real bad at night also she has some question about her labs and said they found a spot on her kidney when they done a u/s and no one has let her know about that

## 2015-03-17 ENCOUNTER — Other Ambulatory Visit: Payer: Commercial Managed Care - HMO

## 2015-03-17 DIAGNOSIS — R944 Abnormal results of kidney function studies: Secondary | ICD-10-CM

## 2015-03-17 LAB — RENAL FUNCTION PANEL
Albumin: 3.8 g/dL (ref 3.5–5.2)
BUN: 32 mg/dL — AB (ref 6–23)
CHLORIDE: 107 meq/L (ref 96–112)
CO2: 21 meq/L (ref 19–32)
Calcium: 9.3 mg/dL (ref 8.4–10.5)
Creat: 1.45 mg/dL — ABNORMAL HIGH (ref 0.50–1.10)
Glucose, Bld: 71 mg/dL (ref 70–99)
Phosphorus: 4.2 mg/dL (ref 2.3–4.6)
Potassium: 5 mEq/L (ref 3.5–5.3)
Sodium: 138 mEq/L (ref 135–145)

## 2015-03-26 ENCOUNTER — Encounter: Payer: Self-pay | Admitting: Family Medicine

## 2015-03-26 ENCOUNTER — Ambulatory Visit (INDEPENDENT_AMBULATORY_CARE_PROVIDER_SITE_OTHER): Payer: Commercial Managed Care - HMO | Admitting: Family Medicine

## 2015-03-26 VITALS — BP 120/60 | HR 80

## 2015-03-26 DIAGNOSIS — R112 Nausea with vomiting, unspecified: Secondary | ICD-10-CM | POA: Diagnosis not present

## 2015-03-26 DIAGNOSIS — Q2733 Arteriovenous malformation of digestive system vessel: Secondary | ICD-10-CM | POA: Diagnosis not present

## 2015-03-26 DIAGNOSIS — R6881 Early satiety: Secondary | ICD-10-CM | POA: Diagnosis not present

## 2015-03-26 DIAGNOSIS — K5520 Angiodysplasia of colon without hemorrhage: Secondary | ICD-10-CM

## 2015-03-26 LAB — COMPREHENSIVE METABOLIC PANEL
ALK PHOS: 61 U/L (ref 39–117)
ALT: <8 U/L (ref 0–35)
AST: 13 U/L (ref 0–37)
Albumin: 4 g/dL (ref 3.5–5.2)
BILIRUBIN TOTAL: 0.5 mg/dL (ref 0.2–1.2)
BUN: 45 mg/dL — AB (ref 6–23)
CO2: 19 meq/L (ref 19–32)
CREATININE: 1.81 mg/dL — AB (ref 0.50–1.10)
Calcium: 9 mg/dL (ref 8.4–10.5)
Chloride: 108 mEq/L (ref 96–112)
GLUCOSE: 109 mg/dL — AB (ref 70–99)
Potassium: 4.9 mEq/L (ref 3.5–5.3)
Sodium: 140 mEq/L (ref 135–145)
Total Protein: 6.7 g/dL (ref 6.0–8.3)

## 2015-03-26 LAB — CBC WITH DIFFERENTIAL/PLATELET
BASOS ABS: 0.1 10*3/uL (ref 0.0–0.1)
BASOS PCT: 1 % (ref 0–1)
EOS PCT: 5 % (ref 0–5)
Eosinophils Absolute: 0.5 10*3/uL (ref 0.0–0.7)
HCT: 27.3 % — ABNORMAL LOW (ref 36.0–46.0)
Hemoglobin: 8.8 g/dL — ABNORMAL LOW (ref 12.0–15.0)
Lymphocytes Relative: 15 % (ref 12–46)
Lymphs Abs: 1.4 10*3/uL (ref 0.7–4.0)
MCH: 26.3 pg (ref 26.0–34.0)
MCHC: 32.2 g/dL (ref 30.0–36.0)
MCV: 81.7 fL (ref 78.0–100.0)
MPV: 9.3 fL (ref 8.6–12.4)
Monocytes Absolute: 1.1 10*3/uL — ABNORMAL HIGH (ref 0.1–1.0)
Monocytes Relative: 12 % (ref 3–12)
NEUTROS PCT: 67 % (ref 43–77)
Neutro Abs: 6.2 10*3/uL (ref 1.7–7.7)
PLATELETS: 488 10*3/uL — AB (ref 150–400)
RBC: 3.34 MIL/uL — AB (ref 3.87–5.11)
RDW: 16.5 % — AB (ref 11.5–15.5)
WBC: 9.2 10*3/uL (ref 4.0–10.5)

## 2015-03-26 NOTE — Progress Notes (Signed)
   Subjective:    Patient ID: Connie [name], female    DOB: April 10, 1934, 81 y.o.   MRN: [id number]  HPI She is here for evaluation of continued difficulty with loose stools that are intermittently black in nature. She continues on iron supplementation. She has a previous history of AVM with bleed. She also is had difficulty with ARF. Her last creatinine was still not back to normal. She also complains of early satiety and occasional nausea with vomiting. This occurs usually 2 or 3 times per week.   Review of Systems     Objective:   Physical Exam Alert and in no distress. Tympanic membranes and canals are normal. Pharyngeal area is normal. Neck is supple without adenopathy or thyromegaly. Cardiac exam shows a regular sinus rhythm without murmurs or gallops. Lungs are clear to auscultation.Abdominal exam shows no masses or tenderness with decreased bowel sounds        Assessment & Plan:  AVM (arteriovenous malformation) of colon - Plan: CBC with Differential/Platelet, Comprehensive metabolic panel  Non-intractable vomiting with nausea, vomiting of unspecified type - Plan: CBC with Differential/Platelet, Comprehensive metabolic panel  Early satiety - Plan: CBC with Differential/Platelet, Comprehensive metabolic panel

## 2015-03-28 ENCOUNTER — Telehealth: Payer: Self-pay | Admitting: Internal Medicine

## 2015-03-28 NOTE — Telephone Encounter (Signed)
Patient informed and verbalized understanding

## 2015-03-28 NOTE — Telephone Encounter (Signed)
Pt called and states she is having a EGD done on Tuesday with Dr. Benson Norway and since her hemoglobin was 8.8. Is it okay to see get that done. Please advise pt

## 2015-03-28 NOTE — Telephone Encounter (Signed)
Yes absolutely

## 2015-04-01 DIAGNOSIS — K259 Gastric ulcer, unspecified as acute or chronic, without hemorrhage or perforation: Secondary | ICD-10-CM | POA: Diagnosis not present

## 2015-04-01 DIAGNOSIS — D509 Iron deficiency anemia, unspecified: Secondary | ICD-10-CM | POA: Diagnosis not present

## 2015-04-01 DIAGNOSIS — R195 Other fecal abnormalities: Secondary | ICD-10-CM | POA: Diagnosis not present

## 2015-04-01 DIAGNOSIS — K3189 Other diseases of stomach and duodenum: Secondary | ICD-10-CM | POA: Diagnosis not present

## 2015-04-01 DIAGNOSIS — K31811 Angiodysplasia of stomach and duodenum with bleeding: Secondary | ICD-10-CM | POA: Diagnosis not present

## 2015-04-01 LAB — HM DIABETES EYE EXAM

## 2015-04-03 ENCOUNTER — Ambulatory Visit (INDEPENDENT_AMBULATORY_CARE_PROVIDER_SITE_OTHER): Payer: Commercial Managed Care - HMO | Admitting: Family Medicine

## 2015-04-03 ENCOUNTER — Encounter: Payer: Self-pay | Admitting: Family Medicine

## 2015-04-03 VITALS — BP 104/50 | HR 69 | Ht 59.0 in | Wt 142.0 lb

## 2015-04-03 DIAGNOSIS — E669 Obesity, unspecified: Secondary | ICD-10-CM

## 2015-04-03 DIAGNOSIS — E119 Type 2 diabetes mellitus without complications: Secondary | ICD-10-CM | POA: Diagnosis not present

## 2015-04-03 DIAGNOSIS — E785 Hyperlipidemia, unspecified: Secondary | ICD-10-CM | POA: Diagnosis not present

## 2015-04-03 DIAGNOSIS — K31811 Angiodysplasia of stomach and duodenum with bleeding: Secondary | ICD-10-CM

## 2015-04-03 DIAGNOSIS — D509 Iron deficiency anemia, unspecified: Secondary | ICD-10-CM | POA: Diagnosis not present

## 2015-04-03 DIAGNOSIS — I10 Essential (primary) hypertension: Secondary | ICD-10-CM

## 2015-04-03 DIAGNOSIS — E1169 Type 2 diabetes mellitus with other specified complication: Secondary | ICD-10-CM

## 2015-04-03 DIAGNOSIS — E1159 Type 2 diabetes mellitus with other circulatory complications: Secondary | ICD-10-CM

## 2015-04-03 DIAGNOSIS — D649 Anemia, unspecified: Secondary | ICD-10-CM | POA: Diagnosis not present

## 2015-04-03 MED ORDER — FERROUS SULFATE 325 (65 FE) MG PO TABS: 325.0000 mg | ORAL_TABLET | Freq: Every day | ORAL | Status: DC

## 2015-04-03 NOTE — Progress Notes (Signed)
  Subjective:    Patient ID: Connie [name], female    DOB: Jul 12, 1934, 81 y.o.   MRN: [id number]  Connie [name] is a 81 y.o. female who presents for follow-up of Type 2 diabetes mellitus.  Home blood sugar records: Patient test one time a day Current symptoms/problems frequent thurst Daily foot checks: yes   Any foot concerns: none Exercise: house work Eye: 04/08/15 She was seen recently by Dr. Benson Norway and in the  Record was reviewed. He did find evidence of Angiodysplasia. She still does feel quite fatigued and presently still on iron. The following portions of the patient's history were reviewed and updated as appropriate: allergies, current medications, past medical history, past social history and problem list.  ROS as in subjective above.     Objective:    Physical Exam Alert and in no distress otherwise not examined.  There were no vitals taken for this visit.  Lab Review Diabetic Labs Latest Ref Rng 03/26/2015 03/17/2015 03/03/2015 02/27/2015 02/21/2015  HbA1c 4.8 - 5.6 % - - - - -  Microalbumin 0.00 - 1.89 mg/dL - - - - -  Micro/Creat Ratio 0.0 - 30.0 mg/g - - - - -  Chol 0 - 200 mg/dL - - - - -  HDL >39 mg/dL - - - - -  Calc LDL 0 - 99 mg/dL - - - - -  Triglycerides <150 mg/dL - - - - -  Creatinine 0.50 - 1.10 mg/dL 1.81(H) 1.45(H) 1.47(H) 1.44(H) 1.03(H)   BP/Weight 03/26/2015 02/27/2015 02/21/2015 05/23/9029 0/05/2329  Systolic BP 076 226 333 - 545  Diastolic BP 60 70 48 - 50  Wt. (Lbs) - 148 151.2 - 147  BMI - 29.88 - 30.52 -   Foot/eye exam completion dates Latest Ref Rng 07/25/2014 03/21/2014  Eye Exam No Retinopathy - No Retinopathy  Foot Form Completion - Done -    Connie [name]  reports that she has never smoked. She has never used smokeless tobacco. She reports that she does not drink alcohol or use illicit drugs. Recent hemoglobin A1c was 6.1    Assessment & Plan:  Angiodysplasia of stomach and duodenum with hemorrhage  Anemia, iron deficiency - Plan: CBC  with Differential/Platelet  Hypertension associated with diabetes  Hyperlipidemia associated with type 2 diabetes mellitus  Obesity (BMI 30.0-34.9)  Symptomatic anemia  Type 2 diabetes mellitus without complication    1. Rx changes: iron 2. Education: Reviewed 'ABCs' of diabetes management (respective goals in parentheses):  A1C (<7), blood pressure (<130/80), and cholesterol (LDL <100). 3. Compliance at present is estimated to be good. Efforts to improve compliance (if necessary) will be directed at increased exercise as tolerated 4. Follow up: 4 months

## 2015-04-03 NOTE — Patient Instructions (Signed)
Set up the appointment with Dr. Benson Norway and then one day before that come back here so call to set that up with Judeen Hammans

## 2015-04-07 ENCOUNTER — Encounter: Payer: Self-pay | Admitting: Internal Medicine

## 2015-04-07 DIAGNOSIS — H3531 Nonexudative age-related macular degeneration: Secondary | ICD-10-CM | POA: Diagnosis not present

## 2015-04-07 DIAGNOSIS — H52223 Regular astigmatism, bilateral: Secondary | ICD-10-CM | POA: Diagnosis not present

## 2015-04-07 DIAGNOSIS — H5202 Hypermetropia, left eye: Secondary | ICD-10-CM | POA: Diagnosis not present

## 2015-04-07 DIAGNOSIS — I10 Essential (primary) hypertension: Secondary | ICD-10-CM | POA: Diagnosis not present

## 2015-04-07 DIAGNOSIS — H25813 Combined forms of age-related cataract, bilateral: Secondary | ICD-10-CM | POA: Diagnosis not present

## 2015-04-07 DIAGNOSIS — H524 Presbyopia: Secondary | ICD-10-CM | POA: Diagnosis not present

## 2015-04-07 DIAGNOSIS — E119 Type 2 diabetes mellitus without complications: Secondary | ICD-10-CM | POA: Diagnosis not present

## 2015-04-28 ENCOUNTER — Other Ambulatory Visit: Payer: Self-pay

## 2015-04-28 ENCOUNTER — Telehealth: Payer: Self-pay | Admitting: Family Medicine

## 2015-04-28 DIAGNOSIS — E1136 Type 2 diabetes mellitus with diabetic cataract: Secondary | ICD-10-CM

## 2015-04-28 MED ORDER — METFORMIN HCL 850 MG PO TABS: 850.0000 mg | ORAL_TABLET | Freq: Two times a day (BID) | ORAL | Status: DC

## 2015-04-28 NOTE — Telephone Encounter (Signed)
Pt called for refills of metformin. Please send to [facility] mail order.

## 2015-05-01 ENCOUNTER — Encounter: Payer: Self-pay | Admitting: Medical

## 2015-05-01 ENCOUNTER — Other Ambulatory Visit: Payer: Self-pay | Admitting: Medical

## 2015-05-01 ENCOUNTER — Telehealth: Payer: Self-pay | Admitting: Medical

## 2015-05-01 ENCOUNTER — Ambulatory Visit (INDEPENDENT_AMBULATORY_CARE_PROVIDER_SITE_OTHER): Payer: Commercial Managed Care - HMO | Admitting: Medical

## 2015-05-01 VITALS — BP 140/60 | HR 92 | Temp 97.8°F | Resp 18 | Wt 136.0 lb

## 2015-05-01 DIAGNOSIS — N289 Disorder of kidney and ureter, unspecified: Secondary | ICD-10-CM | POA: Diagnosis not present

## 2015-05-01 DIAGNOSIS — E038 Other specified hypothyroidism: Secondary | ICD-10-CM

## 2015-05-01 DIAGNOSIS — R5383 Other fatigue: Secondary | ICD-10-CM

## 2015-05-01 DIAGNOSIS — D509 Iron deficiency anemia, unspecified: Secondary | ICD-10-CM

## 2015-05-01 DIAGNOSIS — R42 Dizziness and giddiness: Secondary | ICD-10-CM | POA: Diagnosis not present

## 2015-05-01 LAB — BASIC METABOLIC PANEL
BUN: 50 mg/dL — AB (ref 7–25)
CALCIUM: 9 mg/dL (ref 8.6–10.4)
CO2: 17 mmol/L — ABNORMAL LOW (ref 20–31)
CREATININE: 2.13 mg/dL — AB (ref 0.60–0.93)
Chloride: 107 mmol/L (ref 98–110)
Glucose, Bld: 117 mg/dL — ABNORMAL HIGH (ref 65–99)
Potassium: 5.1 mmol/L (ref 3.5–5.3)
Sodium: 139 mmol/L (ref 135–146)

## 2015-05-01 LAB — CBC WITH DIFFERENTIAL/PLATELET
BASOS ABS: 0 10*3/uL (ref 0.0–0.1)
BASOS PCT: 0 % (ref 0–1)
EOS ABS: 0.3 10*3/uL (ref 0.0–0.7)
EOS PCT: 3 % (ref 0–5)
HCT: 26.7 % — ABNORMAL LOW (ref 36.0–46.0)
Hemoglobin: 8.3 g/dL — ABNORMAL LOW (ref 12.0–15.0)
Lymphocytes Relative: 14 % (ref 12–46)
Lymphs Abs: 1.3 10*3/uL (ref 0.7–4.0)
MCH: 24.3 pg — ABNORMAL LOW (ref 26.0–34.0)
MCHC: 31.1 g/dL (ref 30.0–36.0)
MCV: 78.3 fL (ref 78.0–100.0)
MPV: 9.6 fL (ref 8.6–12.4)
Monocytes Absolute: 1 10*3/uL (ref 0.1–1.0)
Monocytes Relative: 11 % (ref 3–12)
NEUTROS ABS: 6.8 10*3/uL (ref 1.7–7.7)
Neutrophils Relative %: 72 % (ref 43–77)
PLATELETS: 589 10*3/uL — AB (ref 150–400)
RBC: 3.41 MIL/uL — ABNORMAL LOW (ref 3.87–5.11)
RDW: 15.9 % — AB (ref 11.5–15.5)
WBC: 9.4 10*3/uL (ref 4.0–10.5)

## 2015-05-01 LAB — TSH: TSH: 0.329 u[IU]/mL — AB (ref 0.350–4.500)

## 2015-05-01 NOTE — Telephone Encounter (Signed)
[place] daughter in law called wanted you to know that pt is really sick and she didn't know if patient would tell you everything that is going on. States patient has no energy, cold all the time, no appetite, vomiting is better but still 1-2 x weeks, watery diarrhea.  Says something isn't right. [place] daughter would like you not to tell patient that she called.  This patient is being seen by you this morning

## 2015-05-01 NOTE — Progress Notes (Signed)
Subjective: Here for not feeling well.  She has been fatigued and cold for weeks, but the reason for the visit was feeling lightheaded the last few days.   denies vertigo, no URI symptoms, no chest pain, palpations, edema, SOB, no numbness or tingling, no vision changes, no hearing changes, no slurred speech.   She wonders about her blood counts.  Using iron once daily.  Hydrating well.  Blood sugars running ok, BPs at home run ok.  No other aggravating or relieving factors. No other complaint.  Past Medical History  Diagnosis Date  . Hypertension   . Obesity   . Arthritis   . Allergy     RHINITIS  . Hypothyroidism   . Hypercholesteremia   . Diabetes mellitus   . Anemia    ROS as in subjective  Objective: BP 140/60 mmHg  Pulse 92  Temp(Src) 97.8 F (36.6 C) (Oral)  Resp 18  Wt 136 lb (61.689 kg)  Wt Readings from Last 3 Encounters:  05/01/15 136 lb (61.689 kg)  04/03/15 142 lb (64.411 kg)  02/27/15 148 lb (67.132 kg)   BP Readings from Last 3 Encounters:  05/01/15 140/60  04/03/15 104/50  03/26/15 120/60   General appearance: alert, no distress, WD/WN HEENT: watery eyes, but no erythema, otherwise normocephalic, sclerae anicteric, PERRLA, EOMi, nares patent, no discharge or erythema, pharynx normal Oral cavity: MMM, no lesions Neck: supple, no lymphadenopathy, no thyromegaly, no masses, no bruits Heart: RRR, normal S1, S2, no murmurs Lungs: CTA bilaterally, no wheezes, rhonchi, or rales Extremities: no edema, no cyanosis, no clubbing Pulses: 2+ symmetric, upper and lower extremities, normal cap refill Neurological: alert, oriented x 3, CN2-12 intact, strength normal upper extremities and lower extremities, sensation normal throughout, DTRs 2+ throughout, no cerebellar signs, gait normal Psychiatric: normal affect, behavior normal, pleasant     Assessment: Encounter Diagnoses  Name Primary?  Marland Kitchen Anemia, iron deficiency Yes  . Impaired renal function   . Other  specified hypothyroidism   . Other fatigue   . Lightheaded     Plan: Reviewed recent GI notes, recent visit here with Dr. Redmond School, recent labs a month ago.   The only major change in symptoms is lightheaded, thus, STAT labs today in the event Hgb or creatinine is worse.   May need to make medication adjustments.   Advised relative rest, good hydration, and we will call with results and plan.

## 2015-05-02 LAB — T4, FREE: FREE T4: 1.5 ng/dL (ref 0.80–1.80)

## 2015-05-05 DIAGNOSIS — K31811 Angiodysplasia of stomach and duodenum with bleeding: Secondary | ICD-10-CM | POA: Diagnosis not present

## 2015-05-05 DIAGNOSIS — K259 Gastric ulcer, unspecified as acute or chronic, without hemorrhage or perforation: Secondary | ICD-10-CM | POA: Diagnosis not present

## 2015-05-05 DIAGNOSIS — D500 Iron deficiency anemia secondary to blood loss (chronic): Secondary | ICD-10-CM | POA: Diagnosis not present

## 2015-05-08 ENCOUNTER — Institutional Professional Consult (permissible substitution): Payer: Commercial Managed Care - HMO | Admitting: Family Medicine

## 2015-05-09 ENCOUNTER — Telehealth: Payer: Self-pay

## 2015-05-09 ENCOUNTER — Ambulatory Visit (INDEPENDENT_AMBULATORY_CARE_PROVIDER_SITE_OTHER): Payer: Medicare HMO | Admitting: Family Medicine

## 2015-05-09 ENCOUNTER — Institutional Professional Consult (permissible substitution): Payer: Self-pay | Admitting: Family Medicine

## 2015-05-09 ENCOUNTER — Encounter: Payer: Self-pay | Admitting: Family Medicine

## 2015-05-09 VITALS — BP 114/70 | HR 82 | Resp 12 | Ht <= 58 in | Wt 137.0 lb

## 2015-05-09 DIAGNOSIS — N289 Disorder of kidney and ureter, unspecified: Secondary | ICD-10-CM

## 2015-05-09 DIAGNOSIS — D509 Iron deficiency anemia, unspecified: Secondary | ICD-10-CM | POA: Diagnosis not present

## 2015-05-09 LAB — POCT URINALYSIS DIPSTICK
Bilirubin, UA: NEGATIVE
Glucose, UA: NEGATIVE
KETONES UA: NEGATIVE
Leukocytes, UA: NEGATIVE
Nitrite, UA: NEGATIVE
Protein, UA: NEGATIVE
RBC UA: NEGATIVE
SPEC GRAV UA: 1.025
Urobilinogen, UA: NEGATIVE
pH, UA: 5.5

## 2015-05-09 LAB — BASIC METABOLIC PANEL
BUN: 40 mg/dL — AB (ref 7–25)
CO2: 21 mmol/L (ref 20–31)
CREATININE: 1.76 mg/dL — AB (ref 0.60–0.93)
Calcium: 8.8 mg/dL (ref 8.6–10.4)
Chloride: 106 mmol/L (ref 98–110)
Glucose, Bld: 105 mg/dL — ABNORMAL HIGH (ref 65–99)
Potassium: 5.2 mmol/L (ref 3.5–5.3)
Sodium: 136 mmol/L (ref 135–146)

## 2015-05-09 NOTE — Progress Notes (Signed)
   Subjective:    Patient ID: Connie [name], female    DOB: [birth year], 81 y.o.   MRN: [id number]  HPI She is here for a recheck. She states she is drinking plenty of fluids. She does complain of continued fatigue. She was recently seen by Dr. Benson Norway and a capsule endoscopy is scheduled for August 25. She told hold her Lotensin and metformin which she did this was based on her elevated creatinine.   Review of Systems     Objective:   Physical Exam Alert and in no distress otherwise not examined.Urine specific gravity is 1.025       Assessment & Plan:  Anemia, iron deficiency  Impaired renal function - Plan: Basic Metabolic Panel, CANCELED: Basic Metabolic Panel I encouraged her to drink more fluids as this all could be prerenal. Will await on the blood results to see what that shows.

## 2015-05-09 NOTE — Telephone Encounter (Signed)
Left message for pt per Dr.Lalonde to please drink plenty of fluids she is not as hydrated as she thought she was

## 2015-05-12 ENCOUNTER — Other Ambulatory Visit: Payer: Self-pay

## 2015-05-12 DIAGNOSIS — N289 Disorder of kidney and ureter, unspecified: Secondary | ICD-10-CM

## 2015-05-15 ENCOUNTER — Ambulatory Visit (HOSPITAL_COMMUNITY)
Admission: RE | Admit: 2015-05-15 | Discharge: 2015-05-15 | Disposition: A | Discharge status: Home / Self Care | Payer: Commercial Managed Care - HMO | Source: Ambulatory Visit | Attending: Gastroenterology | Admitting: Gastroenterology

## 2015-05-15 ENCOUNTER — Encounter (HOSPITAL_COMMUNITY)
Admission: RE | Disposition: A | Discharge status: Home / Self Care | Payer: Self-pay | Source: Ambulatory Visit | Attending: Gastroenterology

## 2015-05-15 ENCOUNTER — Encounter (HOSPITAL_COMMUNITY): Payer: Self-pay | Admitting: *Deleted

## 2015-05-15 DIAGNOSIS — I10 Essential (primary) hypertension: Secondary | ICD-10-CM | POA: Diagnosis not present

## 2015-05-15 DIAGNOSIS — E669 Obesity, unspecified: Secondary | ICD-10-CM | POA: Insufficient documentation

## 2015-05-15 DIAGNOSIS — D509 Iron deficiency anemia, unspecified: Secondary | ICD-10-CM | POA: Insufficient documentation

## 2015-05-15 DIAGNOSIS — D500 Iron deficiency anemia secondary to blood loss (chronic): Secondary | ICD-10-CM | POA: Diagnosis not present

## 2015-05-15 DIAGNOSIS — K31819 Angiodysplasia of stomach and duodenum without bleeding: Secondary | ICD-10-CM | POA: Diagnosis not present

## 2015-05-15 DIAGNOSIS — K5520 Angiodysplasia of colon without hemorrhage: Secondary | ICD-10-CM | POA: Diagnosis not present

## 2015-05-15 DIAGNOSIS — E039 Hypothyroidism, unspecified: Secondary | ICD-10-CM | POA: Insufficient documentation

## 2015-05-15 DIAGNOSIS — E119 Type 2 diabetes mellitus without complications: Secondary | ICD-10-CM | POA: Insufficient documentation

## 2015-05-15 DIAGNOSIS — E780 Pure hypercholesterolemia: Secondary | ICD-10-CM | POA: Insufficient documentation

## 2015-05-15 HISTORY — PX: GIVENS CAPSULE STUDY: SHX5432

## 2015-05-15 SURGERY — IMAGING PROCEDURE, GI TRACT, INTRALUMINAL, VIA CAPSULE
Anesthesia: LOCAL

## 2015-05-15 SURGICAL SUPPLY — 1 items: TOWEL COTTON PACK 4EA (MISCELLANEOUS) ×4 IMPLANT

## 2015-05-15 NOTE — H&P (Signed)
  Connie [name] HPI: The patient was identified to have four gastric AVMs and one duodenal AVM. The gastric AVMs were oozing and monopolar coagulation was pursued. Her HGB was note to be 8.3 g/dL last Thursday, which is a drop from the 8.8 g/dL on 03/26/2015. She is taking two iron supplements per day.  Past Medical History  Diagnosis Date  . Hypertension   . Obesity   . Arthritis   . Allergy     RHINITIS  . Hypothyroidism   . Hypercholesteremia   . Diabetes mellitus   . Anemia     Past Surgical History  Procedure Laterality Date  . Total thyroidectomy    . Fracture surgery      RIGHT SHOULDER   . Joint replacement      RIGHT KNEE & LEFT KNEE  . Colonoscopy N/A 08/30/2014    Procedure: COLONOSCOPY;  Surgeon: Beryle Beams, MD;  Location: [facility]COPY;  Service: Endoscopy;  Laterality: N/A;  . Hot hemostasis N/A 08/30/2014    Procedure: HOT HEMOSTASIS (ARGON PLASMA COAGULATION/BICAP);  Surgeon: Beryle Beams, MD;  Location: [facility];  Service: Endoscopy;  Laterality: N/A;  . Colonoscopy N/A 02/21/2015    Procedure: COLONOSCOPY;  Surgeon: Carol Ada, MD;  Location: [facility] ENDOSCOPY;  Service: Endoscopy;  Laterality: N/A;    Family History  Problem Relation Age of Onset  . Anemia Sister     Social History:  reports that she has never smoked. She has never used smokeless tobacco. She reports that she does not drink alcohol or use illicit drugs.  Allergies: No Known Allergies  Medications: Scheduled: Continuous:  No results found for this or any previous visit (from the past 24 hour(s)).   No results found.  ROS:  As stated above in the HPI otherwise negative.  There were no vitals taken for this visit.    PE: No performed.  Nursing procedure.  Assessment/Plan: 1) IDA - Capsule endoscopy.  Connie [name] 05/15/2015, 7:38 AM

## 2015-05-16 ENCOUNTER — Other Ambulatory Visit: Payer: Self-pay | Admitting: Gastroenterology

## 2015-05-16 ENCOUNTER — Encounter (HOSPITAL_COMMUNITY): Payer: Self-pay | Admitting: Gastroenterology

## 2015-05-19 ENCOUNTER — Ambulatory Visit (INDEPENDENT_AMBULATORY_CARE_PROVIDER_SITE_OTHER): Payer: Medicare HMO | Admitting: Family Medicine

## 2015-05-19 VITALS — BP 120/70 | HR 83 | Wt 136.8 lb

## 2015-05-19 DIAGNOSIS — L299 Pruritus, unspecified: Secondary | ICD-10-CM | POA: Diagnosis not present

## 2015-05-19 DIAGNOSIS — D509 Iron deficiency anemia, unspecified: Secondary | ICD-10-CM

## 2015-05-19 DIAGNOSIS — Z23 Encounter for immunization: Secondary | ICD-10-CM

## 2015-05-19 DIAGNOSIS — N289 Disorder of kidney and ureter, unspecified: Secondary | ICD-10-CM

## 2015-05-19 LAB — CBC WITH DIFFERENTIAL/PLATELET
BASOS ABS: 0.1 10*3/uL (ref 0.0–0.1)
BASOS PCT: 1 % (ref 0–1)
EOS PCT: 6 % — AB (ref 0–5)
Eosinophils Absolute: 0.6 10*3/uL (ref 0.0–0.7)
HCT: 26.8 % — ABNORMAL LOW (ref 36.0–46.0)
Hemoglobin: 8.4 g/dL — ABNORMAL LOW (ref 12.0–15.0)
LYMPHS PCT: 13 % (ref 12–46)
Lymphs Abs: 1.3 10*3/uL (ref 0.7–4.0)
MCH: 24.8 pg — ABNORMAL LOW (ref 26.0–34.0)
MCHC: 31.3 g/dL (ref 30.0–36.0)
MCV: 79.1 fL (ref 78.0–100.0)
MPV: 9.6 fL (ref 8.6–12.4)
Monocytes Absolute: 1.1 10*3/uL — ABNORMAL HIGH (ref 0.1–1.0)
Monocytes Relative: 11 % (ref 3–12)
Neutro Abs: 6.7 10*3/uL (ref 1.7–7.7)
Neutrophils Relative %: 69 % (ref 43–77)
Platelets: 559 10*3/uL — ABNORMAL HIGH (ref 150–400)
RBC: 3.39 MIL/uL — AB (ref 3.87–5.11)
RDW: 16.9 % — AB (ref 11.5–15.5)
WBC: 9.7 10*3/uL (ref 4.0–10.5)

## 2015-05-19 LAB — BASIC METABOLIC PANEL
BUN: 52 mg/dL — AB (ref 7–25)
CHLORIDE: 103 mmol/L (ref 98–110)
CO2: 20 mmol/L (ref 20–31)
Calcium: 9.1 mg/dL (ref 8.6–10.4)
Creat: 2.02 mg/dL — ABNORMAL HIGH (ref 0.60–0.93)
Glucose, Bld: 170 mg/dL — ABNORMAL HIGH (ref 65–99)
POTASSIUM: 4.5 mmol/L (ref 3.5–5.3)
SODIUM: 137 mmol/L (ref 135–146)

## 2015-05-19 NOTE — Patient Instructions (Signed)
Use Benadryl at night and you can use Eucerin cream

## 2015-05-19 NOTE — Progress Notes (Signed)
   Subjective:    Patient ID: Connie [name], female    DOB: December 16, 1933, 81 y.o.   MRN: [id number]  HPI She is here for blood draw today. He does have difficulty with anemia as well as slightly low renal function. She is here also because of difficulty with itching. She's had no new soaps, detergents, medications. She does not complain of a rash area   Review of Systems     Objective:   Physical Exam Alert and in no distress. Exam of her arms and legs shows no visible lesions.       Assessment & Plan:  Pruritus  Need for prophylactic vaccination and inoculation against influenza - Plan: Flu vaccine HIGH DOSE PF (Fluzone High dose)  Anemia, iron deficiency - Plan: CBC with Differential/Platelet  Abnormal kidney function - Plan: Basic Metabolic Panel Discussed using Eucerin cream to help with this as well as Benadryl at night. If continued difficulty further evaluation will be needed.

## 2015-05-28 ENCOUNTER — Encounter (HOSPITAL_COMMUNITY): Payer: Self-pay | Admitting: *Deleted

## 2015-06-03 ENCOUNTER — Other Ambulatory Visit: Payer: Medicare HMO

## 2015-06-03 DIAGNOSIS — D649 Anemia, unspecified: Secondary | ICD-10-CM | POA: Diagnosis not present

## 2015-06-03 DIAGNOSIS — N289 Disorder of kidney and ureter, unspecified: Secondary | ICD-10-CM | POA: Diagnosis not present

## 2015-06-03 LAB — CBC WITH DIFFERENTIAL/PLATELET
BASOS ABS: 0.1 10*3/uL (ref 0.0–0.1)
BASOS PCT: 1 % (ref 0–1)
Eosinophils Absolute: 0.4 10*3/uL (ref 0.0–0.7)
Eosinophils Relative: 5 % (ref 0–5)
HCT: 25.7 % — ABNORMAL LOW (ref 36.0–46.0)
Hemoglobin: 7.9 g/dL — ABNORMAL LOW (ref 12.0–15.0)
LYMPHS ABS: 1 10*3/uL (ref 0.7–4.0)
Lymphocytes Relative: 13 % (ref 12–46)
MCH: 23.9 pg — ABNORMAL LOW (ref 26.0–34.0)
MCHC: 30.7 g/dL (ref 30.0–36.0)
MCV: 77.9 fL — AB (ref 78.0–100.0)
MPV: 9.9 fL (ref 8.6–12.4)
Monocytes Absolute: 0.8 10*3/uL (ref 0.1–1.0)
Monocytes Relative: 11 % (ref 3–12)
NEUTROS PCT: 70 % (ref 43–77)
Neutro Abs: 5.2 10*3/uL (ref 1.7–7.7)
PLATELETS: 468 10*3/uL — AB (ref 150–400)
RBC: 3.3 MIL/uL — ABNORMAL LOW (ref 3.87–5.11)
RDW: 16.6 % — ABNORMAL HIGH (ref 11.5–15.5)
WBC: 7.4 10*3/uL (ref 4.0–10.5)

## 2015-06-03 LAB — COMPREHENSIVE METABOLIC PANEL
ALK PHOS: 54 U/L (ref 33–130)
ALT: 4 U/L — AB (ref 6–29)
AST: 16 U/L (ref 10–35)
Albumin: 3.7 g/dL (ref 3.6–5.1)
BILIRUBIN TOTAL: 0.5 mg/dL (ref 0.2–1.2)
BUN: 34 mg/dL — ABNORMAL HIGH (ref 7–25)
CALCIUM: 8.8 mg/dL (ref 8.6–10.4)
CO2: 23 mmol/L (ref 20–31)
Chloride: 102 mmol/L (ref 98–110)
Creat: 1.78 mg/dL — ABNORMAL HIGH (ref 0.60–0.93)
GLUCOSE: 135 mg/dL — AB (ref 65–99)
Potassium: 4.3 mmol/L (ref 3.5–5.3)
Sodium: 137 mmol/L (ref 135–146)
TOTAL PROTEIN: 6.1 g/dL (ref 6.1–8.1)

## 2015-06-03 LAB — BASIC METABOLIC PANEL
BUN: 34 mg/dL — ABNORMAL HIGH (ref 7–25)
CHLORIDE: 102 mmol/L (ref 98–110)
CO2: 23 mmol/L (ref 20–31)
CREATININE: 1.78 mg/dL — AB (ref 0.60–0.93)
Calcium: 8.8 mg/dL (ref 8.6–10.4)
Glucose, Bld: 135 mg/dL — ABNORMAL HIGH (ref 65–99)
Potassium: 4.3 mmol/L (ref 3.5–5.3)
Sodium: 137 mmol/L (ref 135–146)

## 2015-06-05 NOTE — H&P (View-Only) (Signed)
   Subjective:    Patient ID: Connie [name], female    DOB: [birth year], 81 y.o.   MRN: [id number]  HPI She is here for blood draw today. He does have difficulty with anemia as well as slightly low renal function. She is here also because of difficulty with itching. She's had no new soaps, detergents, medications. She does not complain of a rash area   Review of Systems     Objective:   Physical Exam Alert and in no distress. Exam of her arms and legs shows no visible lesions.       Assessment & Plan:  Pruritus  Need for prophylactic vaccination and inoculation against influenza - Plan: Flu vaccine HIGH DOSE PF (Fluzone High dose)  Anemia, iron deficiency - Plan: CBC with Differential/Platelet  Abnormal kidney function - Plan: Basic Metabolic Panel Discussed using Eucerin cream to help with this as well as Benadryl at night. If continued difficulty further evaluation will be needed.

## 2015-06-05 NOTE — Interval H&P Note (Signed)
History and Physical Interval Note:  06/05/2015 8:50 AM  Connie [name]  has presented today for surgery, with the diagnosis of IDA  The various methods of treatment have been discussed with the patient and family. After consideration of risks, benefits and other options for treatment, the patient has consented to  Procedure(s): ENTEROSCOPY (N/A) HOT HEMOSTASIS (ARGON PLASMA COAGULATION/BICAP) (N/A) as a surgical intervention .  The patient's history has been reviewed, patient examined, no change in status, stable for surgery.  I have reviewed the patient's chart and labs.  Questions were answered to the patient's satisfaction.     Deshayla Empson D

## 2015-06-05 NOTE — Anesthesia Preprocedure Evaluation (Addendum)
Anesthesia Evaluation  Patient identified by MRN, date of birth, ID band Patient awake    Reviewed: Allergy & Precautions, NPO status , Patient's Chart, lab work & pertinent test results  History of Anesthesia Complications Negative for: history of anesthetic complications  Airway Mallampati: II  TM Distance: >3 FB Neck ROM: Full    Dental no notable dental hx. (+) Dental Advisory Given   Pulmonary neg pulmonary ROS,    Pulmonary exam normal breath sounds clear to auscultation       Cardiovascular hypertension, Pt. on medications Normal cardiovascular exam Rhythm:Regular Rate:Normal     Neuro/Psych negative neurological ROS  negative psych ROS   GI/Hepatic negative GI ROS, Neg liver ROS,   Endo/Other  diabetesHypothyroidism   Renal/GU negative Renal ROS  negative genitourinary   Musculoskeletal  (+) Arthritis ,   Abdominal   Peds negative pediatric ROS (+)  Hematology negative hematology ROS (+)   Anesthesia Other Findings   Reproductive/Obstetrics negative OB ROS                            Anesthesia Physical Anesthesia Plan  ASA: II  Anesthesia Plan: MAC   Post-op Pain Management:    Induction: Intravenous  Airway Management Planned: Nasal Cannula  Additional Equipment:   Intra-op Plan:   Post-operative Plan:   Informed Consent: I have reviewed the patients History and Physical, chart, labs and discussed the procedure including the risks, benefits and alternatives for the proposed anesthesia with the patient or authorized representative who has indicated his/her understanding and acceptance.   Dental advisory given  Plan Discussed with:   Anesthesia Plan Comments:        Anesthesia Quick Evaluation

## 2015-06-06 ENCOUNTER — Encounter (HOSPITAL_COMMUNITY): Payer: Self-pay | Admitting: *Deleted

## 2015-06-06 ENCOUNTER — Encounter (HOSPITAL_COMMUNITY)
Admission: RE | Disposition: A | Discharge status: Home / Self Care | Payer: Self-pay | Source: Ambulatory Visit | Attending: Gastroenterology

## 2015-06-06 ENCOUNTER — Ambulatory Visit (HOSPITAL_COMMUNITY): Payer: Commercial Managed Care - HMO | Admitting: Anesthesiology

## 2015-06-06 ENCOUNTER — Ambulatory Visit (HOSPITAL_COMMUNITY)
Admission: RE | Admit: 2015-06-06 | Discharge: 2015-06-06 | Disposition: A | Discharge status: Home / Self Care | Payer: Commercial Managed Care - HMO | Source: Ambulatory Visit | Attending: Gastroenterology | Admitting: Gastroenterology

## 2015-06-06 DIAGNOSIS — L299 Pruritus, unspecified: Secondary | ICD-10-CM | POA: Insufficient documentation

## 2015-06-06 DIAGNOSIS — K2940 Chronic atrophic gastritis without bleeding: Secondary | ICD-10-CM | POA: Diagnosis not present

## 2015-06-06 DIAGNOSIS — K254 Chronic or unspecified gastric ulcer with hemorrhage: Secondary | ICD-10-CM | POA: Insufficient documentation

## 2015-06-06 DIAGNOSIS — M1990 Unspecified osteoarthritis, unspecified site: Secondary | ICD-10-CM | POA: Diagnosis not present

## 2015-06-06 DIAGNOSIS — D509 Iron deficiency anemia, unspecified: Secondary | ICD-10-CM | POA: Insufficient documentation

## 2015-06-06 DIAGNOSIS — R944 Abnormal results of kidney function studies: Secondary | ICD-10-CM | POA: Insufficient documentation

## 2015-06-06 DIAGNOSIS — I10 Essential (primary) hypertension: Secondary | ICD-10-CM | POA: Insufficient documentation

## 2015-06-06 DIAGNOSIS — K2970 Gastritis, unspecified, without bleeding: Secondary | ICD-10-CM | POA: Diagnosis not present

## 2015-06-06 DIAGNOSIS — K2950 Unspecified chronic gastritis without bleeding: Secondary | ICD-10-CM | POA: Diagnosis not present

## 2015-06-06 DIAGNOSIS — K31819 Angiodysplasia of stomach and duodenum without bleeding: Secondary | ICD-10-CM | POA: Insufficient documentation

## 2015-06-06 DIAGNOSIS — K31811 Angiodysplasia of stomach and duodenum with bleeding: Secondary | ICD-10-CM | POA: Diagnosis not present

## 2015-06-06 DIAGNOSIS — D500 Iron deficiency anemia secondary to blood loss (chronic): Secondary | ICD-10-CM | POA: Diagnosis not present

## 2015-06-06 HISTORY — PX: ENTEROSCOPY: SHX5533

## 2015-06-06 HISTORY — PX: HOT HEMOSTASIS: SHX5433

## 2015-06-06 LAB — CBC
HCT: 26.3 % — ABNORMAL LOW (ref 36.0–46.0)
Hemoglobin: 8 g/dL — ABNORMAL LOW (ref 12.0–15.0)
MCH: 24.4 pg — AB (ref 26.0–34.0)
MCHC: 30.4 g/dL (ref 30.0–36.0)
MCV: 80.2 fL (ref 78.0–100.0)
PLATELETS: 460 10*3/uL — AB (ref 150–400)
RBC: 3.28 MIL/uL — AB (ref 3.87–5.11)
RDW: 16.2 % — AB (ref 11.5–15.5)
WBC: 9.6 10*3/uL (ref 4.0–10.5)

## 2015-06-06 LAB — GLUCOSE, CAPILLARY: GLUCOSE-CAPILLARY: 118 mg/dL — AB (ref 65–99)

## 2015-06-06 SURGERY — ENTEROSCOPY
Anesthesia: Monitor Anesthesia Care

## 2015-06-06 MED ORDER — PROPOFOL 10 MG/ML IV BOLUS
INTRAVENOUS | Status: DC | PRN
  Administered 2015-06-06 (×3): 20 mg via INTRAVENOUS

## 2015-06-06 MED ORDER — PROPOFOL 10 MG/ML IV BOLUS
INTRAVENOUS | Status: AC
  Filled 2015-06-06: qty 20

## 2015-06-06 MED ORDER — GLUCAGON HCL RDNA (DIAGNOSTIC) 1 MG IJ SOLR
INTRAMUSCULAR | Status: AC
  Filled 2015-06-06: qty 1

## 2015-06-06 MED ORDER — GLUCAGON HCL (RDNA) 1 MG IJ SOLR
INTRAMUSCULAR | Status: DC | PRN
  Administered 2015-06-06: .5 mL via INTRAVENOUS

## 2015-06-06 MED ORDER — LACTATED RINGERS IV SOLN
INTRAVENOUS | Status: DC
  Administered 2015-06-06: 1000 mL via INTRAVENOUS

## 2015-06-06 MED ORDER — GLUCAGON HCL RDNA (DIAGNOSTIC) 1 MG IJ SOLR
INTRAMUSCULAR | Status: DC | PRN
  Administered 2015-06-06: .5 mg via INTRAVENOUS

## 2015-06-06 MED ORDER — PROPOFOL INFUSION 10 MG/ML OPTIME
INTRAVENOUS | Status: DC | PRN
  Administered 2015-06-06: 150 ug/kg/min via INTRAVENOUS

## 2015-06-06 MED ORDER — SODIUM CHLORIDE 0.9 % IV SOLN: INTRAVENOUS | Status: DC

## 2015-06-06 NOTE — Transfer of Care (Signed)
Immediate Anesthesia Transfer of Care Note  Patient: Connie [name]  Procedure(s) Performed: Procedure(s): ENTEROSCOPY (N/A) HOT HEMOSTASIS (ARGON PLASMA COAGULATION/BICAP) (N/A)  Patient Location: PACU and Endoscopy Unit  Anesthesia Type:MAC  Level of Consciousness: awake and patient cooperative  Airway & Oxygen Therapy: Patient Spontanous Breathing and Patient connected to nasal cannula oxygen  Post-op Assessment: Report given to RN  Post vital signs: Reviewed and stable  Last Vitals:  Filed Vitals:   06/06/15 0711  BP: 143/33  Temp: 36.8 C  Resp: 9    Complications: No apparent anesthesia complications

## 2015-06-06 NOTE — Discharge Instructions (Signed)
Esophagogastroduodenoscopy °Care After °Refer to this sheet in the next few weeks. These instructions provide you with information on caring for yourself after your procedure. Your caregiver may also give you more specific instructions. Your treatment has been planned according to current medical practices, but problems sometimes occur. Call your caregiver if you have any problems or questions after your procedure.  °HOME CARE INSTRUCTIONS °· Do not eat or drink anything until the numbing medicine (local anesthetic) has worn off and your gag reflex has returned. You will know that the local anesthetic has worn off when you can swallow comfortably. °· Do not drive for 12 hours after the procedure or as directed by your caregiver. °· Only take medicines as directed by your caregiver. °SEEK MEDICAL CARE IF:  °· You cannot stop coughing. °· You are not urinating at all or less than usual. °SEEK IMMEDIATE MEDICAL CARE IF: °· You have difficulty swallowing. °· You cannot eat or drink. °· You have worsening throat or chest pain. °· You have dizziness, lightheadedness, or you faint. °· You have nausea or vomiting. °· You have chills. °· You have a fever. °· You have severe abdominal pain. °· You have black, tarry, or bloody stools. °Document Released: 08/23/2012 Document Reviewed: 08/23/2012 °Exit[employer]® Patient Information ©2015 [employer]. This information is not intended to replace advice given to you by your health care provider. Make sure you discuss any questions you have with your health care provider. ° °

## 2015-06-06 NOTE — Anesthesia Postprocedure Evaluation (Signed)
  Anesthesia Post-op Note  Patient: Connie [name]  Procedure(s) Performed: Procedure(s) (LRB): ENTEROSCOPY (N/A) HOT HEMOSTASIS (ARGON PLASMA COAGULATION/BICAP) (N/A)  Patient Location: PACU  Anesthesia Type: MAC  Level of Consciousness: awake and alert   Airway and Oxygen Therapy: Patient Spontanous Breathing  Post-op Pain: mild  Post-op Assessment: Post-op Vital signs reviewed, Patient's Cardiovascular Status Stable, Respiratory Function Stable, Patent Airway and No signs of Nausea or vomiting  Last Vitals:  Filed Vitals:   06/06/15 0936  BP: 125/44  Pulse:   Temp:   Resp: 16    Post-op Vital Signs: stable   Complications: No apparent anesthesia complications

## 2015-06-06 NOTE — Op Note (Signed)
[place], [address]   ENTEROSCOPY PROCEDURE REPORT     EXAM DATE: 06/06/2015  PATIENT NAME:      Connie, [name]           MR #: [id number] BIRTHDATE:       26-Aug-1934      VISIT #:     [phone]  ATTENDING:     Carol Ada, MD     STATUS:     outpatient ASSISTANT:      William Dalton and Doran Heater MD:  Jill Alexanders, M.D. ASA CLASS:        Class III  INDICATIONS:  The patient is a 81 yr old female here for an enteroscopy procedure due to a-v malformation and anemia. PROCEDURE PERFORMED:     Small bowel enteroscopy with ablation therapy  MEDICATIONS:     Monitored anesthesia care  CONSENT: The patient understands the risks and benefits of the procedure and understands that these risks include, but are not limited to: sedation, allergic reaction, infection, perforation and/or bleeding. Alternative means of evaluation and treatment include, among others: physical exam, x-rays, and/or surgical intervention. The patient elects to proceed with this endoscopic procedure.  DESCRIPTION OF PROCEDURE: During intra-op preparation period all mechanical & medical equipment was checked for proper function. Hand hygiene and appropriate measures for infection prevention was taken. After the risks, benefits and alternatives of the procedure were thoroughly explained, Informed consent was verified, confirmed and timeout was successfully executed by the treatment team. The    endoscope was introduced through the mouth and advanced to the proximal jejunum jejunum. The prep was The overall prep quality was good.. The instrument was then slowly withdrawn while examining the mucosa circumferentially. The scope was then completely withdrawn from the patient and the procedure terminated. The pulse, BP, and O2 saturation were monitored and documented by the physician and the nursing staff throughout the entire  procedure.  The patient was cared for as planned according to standard protocol, then discharged to recovery in stable condition and with appropriate post procedure care. Estimated blood loss is zero unless otherwise noted in this procedure report.    FINDINGS: The esophagus was normal.  In the gastric lumen the mucosa exhibited an atrophic gastritis.  There were several erosions with hemocystic spots identified and there was suspicion of recent bleeding.  Cold biopsies of the gastritis were obtained.  The pediatric colonoscope was advanced to the proximal jejunum. Starting in the distal thrid portion of the duodenum fresh blood was identified.  Extensive washing was performed, but there was no reaccumulation of blood.  A total of 7-10 nonbleeding AVMs were ablated wtih APC.  The small bowel was evaluated twice.Marland Kitchen    ADVERSE EVENTS:      There were no immediate complications.  IMPRESSIONS:     1) Gastric erosions with evidence of bleeding. 2) Atrophic gastritis. 3) Multiple duodenal AVMs.  RECOMMENDATIONS:     1) Start PPI. 2) Follow biopsies. 3) Continue with iron supplementation. 4) Follow up in the office in 2 weeks to recheck HGB. RECALL:  _____________________________ Carol Ada, MD eSigned:  Carol Ada, MD 06/06/2015 9:10 AM   cc:     PATIENT NAME:  Connie, [name] MR#: [id number]

## 2015-06-09 ENCOUNTER — Encounter (HOSPITAL_COMMUNITY): Payer: Self-pay | Admitting: Gastroenterology

## 2015-06-16 ENCOUNTER — Telehealth: Payer: Self-pay

## 2015-06-16 NOTE — Telephone Encounter (Signed)
PT INFORMED AND VERBALIZED UNDERSTANDING 

## 2015-06-16 NOTE — Telephone Encounter (Signed)
Call Dr. Ulyses Amor office and see if he needs any blood work prior to her visit

## 2015-06-16 NOTE — Telephone Encounter (Signed)
Pt says she is going back to Dr. Benson Norway on Wednesday and wants to know if she should get her blood work done here before then.

## 2015-06-18 ENCOUNTER — Other Ambulatory Visit: Payer: Self-pay | Admitting: Gastroenterology

## 2015-06-18 DIAGNOSIS — K31811 Angiodysplasia of stomach and duodenum with bleeding: Secondary | ICD-10-CM

## 2015-06-18 DIAGNOSIS — K254 Chronic or unspecified gastric ulcer with hemorrhage: Secondary | ICD-10-CM | POA: Diagnosis not present

## 2015-06-18 DIAGNOSIS — D500 Iron deficiency anemia secondary to blood loss (chronic): Secondary | ICD-10-CM | POA: Diagnosis not present

## 2015-06-19 ENCOUNTER — Telehealth: Payer: Self-pay | Admitting: Family Medicine

## 2015-06-19 NOTE — Telephone Encounter (Signed)
Dr. Ulyses Amor office called & states pt's iron level critically low and they are scheduling a blood transfusion on Monday,  They had to cancel her appt here for Monday and they will ask her to reschedule

## 2015-06-20 ENCOUNTER — Other Ambulatory Visit (HOSPITAL_COMMUNITY): Payer: Self-pay | Admitting: *Deleted

## 2015-06-23 ENCOUNTER — Ambulatory Visit: Payer: Medicare HMO | Admitting: Family Medicine

## 2015-06-23 ENCOUNTER — Ambulatory Visit (HOSPITAL_COMMUNITY)
Admission: RE | Admit: 2015-06-23 | Discharge: 2015-06-23 | Disposition: A | Discharge status: Home / Self Care | Payer: Commercial Managed Care - HMO | Source: Ambulatory Visit | Attending: Gastroenterology | Admitting: Gastroenterology

## 2015-06-23 DIAGNOSIS — K257 Chronic gastric ulcer without hemorrhage or perforation: Secondary | ICD-10-CM | POA: Insufficient documentation

## 2015-06-23 DIAGNOSIS — K31811 Angiodysplasia of stomach and duodenum with bleeding: Secondary | ICD-10-CM | POA: Insufficient documentation

## 2015-06-23 LAB — PREPARE RBC (CROSSMATCH)

## 2015-06-23 MED ORDER — SODIUM CHLORIDE 0.9 % IV SOLN: Freq: Once | INTRAVENOUS | Status: DC

## 2015-06-24 ENCOUNTER — Encounter: Payer: Self-pay | Admitting: Internal Medicine

## 2015-06-24 ENCOUNTER — Encounter: Payer: Self-pay | Admitting: Family Medicine

## 2015-06-24 DIAGNOSIS — K254 Chronic or unspecified gastric ulcer with hemorrhage: Secondary | ICD-10-CM

## 2015-06-24 DIAGNOSIS — K31811 Angiodysplasia of stomach and duodenum with bleeding: Secondary | ICD-10-CM

## 2015-06-24 DIAGNOSIS — D500 Iron deficiency anemia secondary to blood loss (chronic): Secondary | ICD-10-CM

## 2015-06-24 LAB — TYPE AND SCREEN
ABO/RH(D): A POS
Antibody Screen: NEGATIVE
Unit division: 0
Unit division: 0

## 2015-07-01 ENCOUNTER — Ambulatory Visit (HOSPITAL_COMMUNITY)
Admission: RE | Admit: 2015-07-01 | Discharge: 2015-07-01 | Disposition: A | Discharge status: Home / Self Care | Payer: Commercial Managed Care - HMO | Source: Ambulatory Visit | Attending: Gastroenterology | Admitting: Gastroenterology

## 2015-07-01 DIAGNOSIS — K254 Chronic or unspecified gastric ulcer with hemorrhage: Secondary | ICD-10-CM | POA: Diagnosis not present

## 2015-07-01 DIAGNOSIS — D500 Iron deficiency anemia secondary to blood loss (chronic): Secondary | ICD-10-CM | POA: Insufficient documentation

## 2015-07-01 DIAGNOSIS — K31811 Angiodysplasia of stomach and duodenum with bleeding: Secondary | ICD-10-CM | POA: Insufficient documentation

## 2015-07-01 DIAGNOSIS — K922 Gastrointestinal hemorrhage, unspecified: Secondary | ICD-10-CM | POA: Diagnosis not present

## 2015-07-01 MED ORDER — TECHNETIUM TC 99M-LABELED RED BLOOD CELLS IV KIT
25.0000 | PACK | Freq: Once | INTRAVENOUS | Status: AC | PRN
  Administered 2015-07-01: 25 via INTRAVENOUS

## 2015-07-04 ENCOUNTER — Telehealth: Payer: Self-pay | Admitting: Internal Medicine

## 2015-07-04 NOTE — Telephone Encounter (Signed)
Dr. Ulyses Amor office called to let us know that they can not find out where she is bleeding from so they are having to send her to [place] to have a double bubble enteroscopy done and needs a referral since pt has humana. Just an FYI for you!  Pt will be set up to see Dr. Olegario Messier sometime within a week as Dr. Ulyses Amor send them info to get her in ICD- 10- K31.811     K25.4  ID- [id number]  Phone # [phone] Fax # [phone]

## 2015-07-06 NOTE — Telephone Encounter (Signed)
Make sure that the paperwork was filled out to get this taken care of

## 2015-07-07 NOTE — Telephone Encounter (Signed)
AUTH # [id number] # 6 VISITS 07/04/15 TO 12/31/15

## 2015-07-10 DIAGNOSIS — D500 Iron deficiency anemia secondary to blood loss (chronic): Secondary | ICD-10-CM | POA: Diagnosis not present

## 2015-07-17 ENCOUNTER — Encounter: Payer: Self-pay | Admitting: Family Medicine

## 2015-07-24 ENCOUNTER — Other Ambulatory Visit: Payer: Self-pay

## 2015-07-24 ENCOUNTER — Telehealth: Payer: Self-pay | Admitting: Family Medicine

## 2015-07-24 DIAGNOSIS — E1136 Type 2 diabetes mellitus with diabetic cataract: Secondary | ICD-10-CM

## 2015-07-24 MED ORDER — AMLODIPINE BESYLATE 10 MG PO TABS: 10.0000 mg | ORAL_TABLET | Freq: Every evening | ORAL | Status: DC

## 2015-07-24 MED ORDER — PIOGLITAZONE HCL 30 MG PO TABS: 30.0000 mg | ORAL_TABLET | Freq: Every day | ORAL | Status: DC

## 2015-07-24 MED ORDER — METFORMIN HCL 850 MG PO TABS: 850.0000 mg | ORAL_TABLET | Freq: Every day | ORAL | Status: DC

## 2015-07-24 NOTE — Telephone Encounter (Signed)
I have sent this in

## 2015-07-24 NOTE — Telephone Encounter (Signed)
Pt requesting refill on Amlodipine 10mg  and Pioglitazone 30mg 

## 2015-07-30 DIAGNOSIS — E119 Type 2 diabetes mellitus without complications: Secondary | ICD-10-CM | POA: Diagnosis not present

## 2015-07-30 DIAGNOSIS — D500 Iron deficiency anemia secondary to blood loss (chronic): Secondary | ICD-10-CM | POA: Diagnosis not present

## 2015-07-30 DIAGNOSIS — Z7984 Long term (current) use of oral hypoglycemic drugs: Secondary | ICD-10-CM | POA: Diagnosis not present

## 2015-07-30 DIAGNOSIS — I10 Essential (primary) hypertension: Secondary | ICD-10-CM | POA: Diagnosis not present

## 2015-07-30 DIAGNOSIS — Z79899 Other long term (current) drug therapy: Secondary | ICD-10-CM | POA: Diagnosis not present

## 2015-08-05 ENCOUNTER — Ambulatory Visit: Payer: Commercial Managed Care - HMO | Admitting: Family Medicine

## 2015-08-22 ENCOUNTER — Encounter: Payer: Self-pay | Admitting: Family Medicine

## 2015-09-01 ENCOUNTER — Encounter: Payer: Self-pay | Admitting: Family Medicine

## 2015-09-01 ENCOUNTER — Ambulatory Visit (INDEPENDENT_AMBULATORY_CARE_PROVIDER_SITE_OTHER): Payer: Commercial Managed Care - HMO | Admitting: Family Medicine

## 2015-09-01 VITALS — BP 122/66 | HR 68 | Temp 98.2°F | Ht 59.0 in | Wt 121.4 lb

## 2015-09-01 DIAGNOSIS — D509 Iron deficiency anemia, unspecified: Secondary | ICD-10-CM

## 2015-09-01 DIAGNOSIS — E118 Type 2 diabetes mellitus with unspecified complications: Secondary | ICD-10-CM | POA: Diagnosis not present

## 2015-09-01 DIAGNOSIS — E1159 Type 2 diabetes mellitus with other circulatory complications: Secondary | ICD-10-CM | POA: Diagnosis not present

## 2015-09-01 DIAGNOSIS — R5383 Other fatigue: Secondary | ICD-10-CM

## 2015-09-01 DIAGNOSIS — K31811 Angiodysplasia of stomach and duodenum with bleeding: Secondary | ICD-10-CM | POA: Diagnosis not present

## 2015-09-01 DIAGNOSIS — I152 Hypertension secondary to endocrine disorders: Secondary | ICD-10-CM

## 2015-09-01 DIAGNOSIS — I10 Essential (primary) hypertension: Secondary | ICD-10-CM | POA: Diagnosis not present

## 2015-09-01 DIAGNOSIS — E669 Obesity, unspecified: Secondary | ICD-10-CM | POA: Diagnosis not present

## 2015-09-01 DIAGNOSIS — E1169 Type 2 diabetes mellitus with other specified complication: Secondary | ICD-10-CM | POA: Diagnosis not present

## 2015-09-01 DIAGNOSIS — E785 Hyperlipidemia, unspecified: Secondary | ICD-10-CM

## 2015-09-01 LAB — POCT GLYCOSYLATED HEMOGLOBIN (HGB A1C): HEMOGLOBIN A1C: 6.3

## 2015-09-01 MED ORDER — ATORVASTATIN CALCIUM 40 MG PO TABS: ORAL_TABLET | ORAL | Status: DC

## 2015-09-01 NOTE — Progress Notes (Signed)
  Subjective:   Connie [name] is an 81 y.o. female who presents for follow up of Type 2 diabetes mellitus.  She has a procedure scheduled at [facility] to further evaluate her anemia. She continues to complain of fatigue. She would also like a handicap placard. Patient is checking home blood sugars.   Home blood sugar records: BGs range between 100 and 120 Current symptoms include: none. Patient denies foot ulcerations.  Patient is checking their feet daily. Foot concerns (callous, ulcer, wound, thickened nails, toenail fungus, skin fungus, hammer toe): none Last dilated eye exam 4 months ago.  Current treatments: none. Medication compliance: excellent  Current diet: in general, a "healthy" diet   Current exercise: none Known diabetic complications: none   The following portions of the patient's history were reviewed and updated as appropriate: allergies, current medications, past family history, past medical history, past social history, past surgical history and problem list.  ROS as in subjective above    Objective:   Alert and in no distress. Hemoglobin A1c 6.3.   Assessment:   Hyperlipidemia associated with type 2 diabetes mellitus ([place]) - Plan: atorvastatin (LIPITOR) 40 MG tablet, HgB A1c  Anemia, iron deficiency - Plan: CBC with Differential/Platelet  Hypertension associated with diabetes ([place])  Other fatigue  Angiodysplasia of stomach and duodenum with hemorrhage  Type 2 diabetes mellitus with complication, without long-term current use of insulin ([name])  Obesity (BMI 30.0-34.9)    Plan:   Diabetes Mellitus type 2: Education: Reviewed 'ABCs' of diabetes management (respective goals in parentheses):  A1C (<7), blood pressure (<130/80), and cholesterol (LDL <100)   Diabetes mellitus Type II, under excellent control.   Compliance at present is estimated to be excellent. Efforts to improve compliance (if necessary) will be directed at increased  exercise.    Blood pressure: normal blood pressure .   An ACE/ARB is currently part of their treatment regimen.   Dyslipidemia under good control. .  A statin is currently part of their treatment regimen.   Encouraged aerobic exercise.    Follow up: 4 months   she is doing quite well except for the fatigue and hopefully the GI evaluation for her bleeding will be successful. She and her husband are both still slowly dwindling

## 2015-09-02 LAB — CBC WITH DIFFERENTIAL/PLATELET
BASOS PCT: 1 % (ref 0–1)
Basophils Absolute: 0.1 10*3/uL (ref 0.0–0.1)
EOS ABS: 0.1 10*3/uL (ref 0.0–0.7)
Eosinophils Relative: 2 % (ref 0–5)
HCT: 31.9 % — ABNORMAL LOW (ref 36.0–46.0)
Hemoglobin: 9.8 g/dL — ABNORMAL LOW (ref 12.0–15.0)
Lymphocytes Relative: 19 % (ref 12–46)
Lymphs Abs: 1.4 10*3/uL (ref 0.7–4.0)
MCH: 25.1 pg — AB (ref 26.0–34.0)
MCHC: 30.7 g/dL (ref 30.0–36.0)
MCV: 81.6 fL (ref 78.0–100.0)
MONO ABS: 0.9 10*3/uL (ref 0.1–1.0)
MONOS PCT: 12 % (ref 3–12)
MPV: 9.9 fL (ref 8.6–12.4)
NEUTROS PCT: 66 % (ref 43–77)
Neutro Abs: 4.9 10*3/uL (ref 1.7–7.7)
PLATELETS: 421 10*3/uL — AB (ref 150–400)
RBC: 3.91 MIL/uL (ref 3.87–5.11)
RDW: 17.7 % — ABNORMAL HIGH (ref 11.5–15.5)
WBC: 7.4 10*3/uL (ref 4.0–10.5)

## 2015-09-12 DIAGNOSIS — Z01810 Encounter for preprocedural cardiovascular examination: Secondary | ICD-10-CM | POA: Diagnosis not present

## 2015-09-26 DIAGNOSIS — K922 Gastrointestinal hemorrhage, unspecified: Secondary | ICD-10-CM | POA: Diagnosis not present

## 2015-09-26 DIAGNOSIS — Z79899 Other long term (current) drug therapy: Secondary | ICD-10-CM | POA: Diagnosis not present

## 2015-09-26 DIAGNOSIS — E119 Type 2 diabetes mellitus without complications: Secondary | ICD-10-CM | POA: Diagnosis not present

## 2015-09-26 DIAGNOSIS — Z7982 Long term (current) use of aspirin: Secondary | ICD-10-CM | POA: Diagnosis not present

## 2015-09-26 DIAGNOSIS — Z7984 Long term (current) use of oral hypoglycemic drugs: Secondary | ICD-10-CM | POA: Diagnosis not present

## 2015-09-26 DIAGNOSIS — K2940 Chronic atrophic gastritis without bleeding: Secondary | ICD-10-CM | POA: Diagnosis not present

## 2015-09-26 DIAGNOSIS — I10 Essential (primary) hypertension: Secondary | ICD-10-CM | POA: Diagnosis not present

## 2015-09-26 DIAGNOSIS — E039 Hypothyroidism, unspecified: Secondary | ICD-10-CM | POA: Diagnosis not present

## 2015-09-26 DIAGNOSIS — E785 Hyperlipidemia, unspecified: Secondary | ICD-10-CM | POA: Diagnosis not present

## 2015-09-26 DIAGNOSIS — D500 Iron deficiency anemia secondary to blood loss (chronic): Secondary | ICD-10-CM | POA: Diagnosis not present

## 2015-09-26 DIAGNOSIS — K5521 Angiodysplasia of colon with hemorrhage: Secondary | ICD-10-CM | POA: Diagnosis not present

## 2015-10-01 ENCOUNTER — Other Ambulatory Visit: Payer: Self-pay | Admitting: Gastroenterology

## 2015-10-01 DIAGNOSIS — R634 Abnormal weight loss: Secondary | ICD-10-CM | POA: Diagnosis not present

## 2015-10-01 DIAGNOSIS — R112 Nausea with vomiting, unspecified: Secondary | ICD-10-CM | POA: Diagnosis not present

## 2015-10-01 DIAGNOSIS — R1033 Periumbilical pain: Secondary | ICD-10-CM | POA: Diagnosis not present

## 2015-10-01 DIAGNOSIS — K31811 Angiodysplasia of stomach and duodenum with bleeding: Secondary | ICD-10-CM | POA: Diagnosis not present

## 2015-10-01 DIAGNOSIS — D500 Iron deficiency anemia secondary to blood loss (chronic): Secondary | ICD-10-CM | POA: Diagnosis not present

## 2015-10-01 DIAGNOSIS — R110 Nausea: Secondary | ICD-10-CM

## 2015-10-07 ENCOUNTER — Ambulatory Visit
Admission: RE | Admit: 2015-10-07 | Discharge: 2015-10-07 | Disposition: A | Discharge status: Home / Self Care | Payer: Commercial Managed Care - HMO | Source: Ambulatory Visit | Attending: Gastroenterology | Admitting: Gastroenterology

## 2015-10-07 DIAGNOSIS — K8020 Calculus of gallbladder without cholecystitis without obstruction: Secondary | ICD-10-CM | POA: Diagnosis not present

## 2015-10-07 DIAGNOSIS — R110 Nausea: Secondary | ICD-10-CM

## 2015-10-07 DIAGNOSIS — R1033 Periumbilical pain: Secondary | ICD-10-CM

## 2015-10-07 MED ORDER — IOPAMIDOL (ISOVUE-300) INJECTION 61%
80.0000 mL | Freq: Once | INTRAVENOUS | Status: AC | PRN
  Administered 2015-10-07: 80 mL via INTRAVENOUS

## 2015-10-10 ENCOUNTER — Other Ambulatory Visit: Payer: Self-pay | Admitting: Gastroenterology

## 2015-10-10 ENCOUNTER — Ambulatory Visit (INDEPENDENT_AMBULATORY_CARE_PROVIDER_SITE_OTHER): Payer: Commercial Managed Care - HMO | Admitting: Family Medicine

## 2015-10-10 ENCOUNTER — Inpatient Hospital Stay (HOSPITAL_COMMUNITY)
Admission: EM | Admit: 2015-10-10 | Discharge: 2015-10-12 | DRG: 683 | Disposition: A | Discharge status: Home / Self Care | Payer: Commercial Managed Care - HMO | Attending: Family Medicine | Admitting: Family Medicine

## 2015-10-10 ENCOUNTER — Encounter: Payer: Self-pay | Admitting: Family Medicine

## 2015-10-10 ENCOUNTER — Encounter (HOSPITAL_COMMUNITY): Payer: Self-pay | Admitting: *Deleted

## 2015-10-10 ENCOUNTER — Emergency Department (HOSPITAL_COMMUNITY): Payer: Commercial Managed Care - HMO

## 2015-10-10 VITALS — BP 120/50 | HR 80 | Temp 98.0°F | Resp 12 | Wt 108.0 lb

## 2015-10-10 DIAGNOSIS — E1369 Other specified diabetes mellitus with other specified complication: Secondary | ICD-10-CM | POA: Diagnosis present

## 2015-10-10 DIAGNOSIS — K922 Gastrointestinal hemorrhage, unspecified: Secondary | ICD-10-CM | POA: Diagnosis not present

## 2015-10-10 DIAGNOSIS — Z7982 Long term (current) use of aspirin: Secondary | ICD-10-CM

## 2015-10-10 DIAGNOSIS — E785 Hyperlipidemia, unspecified: Secondary | ICD-10-CM | POA: Diagnosis present

## 2015-10-10 DIAGNOSIS — E039 Hypothyroidism, unspecified: Secondary | ICD-10-CM | POA: Diagnosis present

## 2015-10-10 DIAGNOSIS — K254 Chronic or unspecified gastric ulcer with hemorrhage: Secondary | ICD-10-CM | POA: Diagnosis present

## 2015-10-10 DIAGNOSIS — N179 Acute kidney failure, unspecified: Secondary | ICD-10-CM | POA: Diagnosis not present

## 2015-10-10 DIAGNOSIS — E118 Type 2 diabetes mellitus with unspecified complications: Secondary | ICD-10-CM | POA: Diagnosis present

## 2015-10-10 DIAGNOSIS — E119 Type 2 diabetes mellitus without complications: Secondary | ICD-10-CM | POA: Diagnosis present

## 2015-10-10 DIAGNOSIS — Z7984 Long term (current) use of oral hypoglycemic drugs: Secondary | ICD-10-CM | POA: Diagnosis not present

## 2015-10-10 DIAGNOSIS — K31811 Angiodysplasia of stomach and duodenum with bleeding: Secondary | ICD-10-CM | POA: Diagnosis present

## 2015-10-10 DIAGNOSIS — E872 Acidosis, unspecified: Secondary | ICD-10-CM | POA: Diagnosis present

## 2015-10-10 DIAGNOSIS — R112 Nausea with vomiting, unspecified: Secondary | ICD-10-CM | POA: Diagnosis not present

## 2015-10-10 DIAGNOSIS — R1013 Epigastric pain: Secondary | ICD-10-CM | POA: Diagnosis not present

## 2015-10-10 DIAGNOSIS — D500 Iron deficiency anemia secondary to blood loss (chronic): Secondary | ICD-10-CM | POA: Diagnosis not present

## 2015-10-10 DIAGNOSIS — K8020 Calculus of gallbladder without cholecystitis without obstruction: Secondary | ICD-10-CM | POA: Diagnosis not present

## 2015-10-10 DIAGNOSIS — E860 Dehydration: Secondary | ICD-10-CM | POA: Diagnosis present

## 2015-10-10 DIAGNOSIS — E1169 Type 2 diabetes mellitus with other specified complication: Secondary | ICD-10-CM | POA: Diagnosis not present

## 2015-10-10 DIAGNOSIS — R778 Other specified abnormalities of plasma proteins: Secondary | ICD-10-CM | POA: Diagnosis present

## 2015-10-10 DIAGNOSIS — I10 Essential (primary) hypertension: Secondary | ICD-10-CM | POA: Diagnosis not present

## 2015-10-10 DIAGNOSIS — R630 Anorexia: Secondary | ICD-10-CM

## 2015-10-10 DIAGNOSIS — R110 Nausea: Secondary | ICD-10-CM

## 2015-10-10 DIAGNOSIS — E1159 Type 2 diabetes mellitus with other circulatory complications: Secondary | ICD-10-CM | POA: Diagnosis not present

## 2015-10-10 DIAGNOSIS — R748 Abnormal levels of other serum enzymes: Secondary | ICD-10-CM | POA: Diagnosis present

## 2015-10-10 DIAGNOSIS — R7989 Other specified abnormal findings of blood chemistry: Secondary | ICD-10-CM | POA: Diagnosis not present

## 2015-10-10 DIAGNOSIS — R1010 Upper abdominal pain, unspecified: Secondary | ICD-10-CM | POA: Diagnosis present

## 2015-10-10 DIAGNOSIS — R634 Abnormal weight loss: Secondary | ICD-10-CM | POA: Diagnosis not present

## 2015-10-10 LAB — URINALYSIS, ROUTINE W REFLEX MICROSCOPIC
GLUCOSE, UA: NEGATIVE mg/dL
HGB URINE DIPSTICK: NEGATIVE
Ketones, ur: 15 mg/dL — AB
Nitrite: NEGATIVE
Protein, ur: NEGATIVE mg/dL
SPECIFIC GRAVITY, URINE: 1.017 (ref 1.005–1.030)
pH: 5 (ref 5.0–8.0)

## 2015-10-10 LAB — COMPREHENSIVE METABOLIC PANEL
ALBUMIN: 3.5 g/dL (ref 3.5–5.0)
ALK PHOS: 91 U/L (ref 38–126)
ALT: 7 U/L — AB (ref 14–54)
AST: 27 U/L (ref 15–41)
Anion gap: 24 — ABNORMAL HIGH (ref 5–15)
BUN: 68 mg/dL — ABNORMAL HIGH (ref 6–20)
CALCIUM: 8.8 mg/dL — AB (ref 8.9–10.3)
CHLORIDE: 90 mmol/L — AB (ref 101–111)
CO2: 19 mmol/L — AB (ref 22–32)
CREATININE: 5.67 mg/dL — AB (ref 0.44–1.00)
GFR calc Af Amer: 7 mL/min — ABNORMAL LOW (ref >60)
GFR calc non Af Amer: 6 mL/min — ABNORMAL LOW (ref >60)
GLUCOSE: 125 mg/dL — AB (ref 65–99)
Potassium: 3.4 mmol/L — ABNORMAL LOW (ref 3.5–5.1)
SODIUM: 133 mmol/L — AB (ref 135–145)
Total Bilirubin: 1.1 mg/dL (ref 0.3–1.2)
Total Protein: 8.1 g/dL (ref 6.5–8.1)

## 2015-10-10 LAB — CBC
HCT: 34.7 % — ABNORMAL LOW (ref 36.0–46.0)
HEMOGLOBIN: 11.1 g/dL — AB (ref 12.0–15.0)
MCH: 25.2 pg — AB (ref 26.0–34.0)
MCHC: 32 g/dL (ref 30.0–36.0)
MCV: 78.9 fL (ref 78.0–100.0)
Platelets: 667 10*3/uL — ABNORMAL HIGH (ref 150–400)
RBC: 4.4 MIL/uL (ref 3.87–5.11)
RDW: 15.9 % — ABNORMAL HIGH (ref 11.5–15.5)
WBC: 21.5 10*3/uL — ABNORMAL HIGH (ref 4.0–10.5)

## 2015-10-10 LAB — TROPONIN I: TROPONIN I: 0.08 ng/mL — AB (ref <0.031)

## 2015-10-10 LAB — POC OCCULT BLOOD, ED: Fecal Occult Bld: POSITIVE — AB

## 2015-10-10 LAB — URINE MICROSCOPIC-ADD ON: RBC / HPF: NONE SEEN RBC/hpf (ref 0–5)

## 2015-10-10 LAB — LIPASE, BLOOD: Lipase: 27 U/L (ref 11–51)

## 2015-10-10 MED ORDER — ONDANSETRON HCL 4 MG/2ML IJ SOLN
4.0000 mg | Freq: Once | INTRAMUSCULAR | Status: AC
  Administered 2015-10-10: 4 mg via INTRAVENOUS
  Filled 2015-10-10: qty 2

## 2015-10-10 MED ORDER — SODIUM CHLORIDE 0.9 % IV BOLUS (SEPSIS)
1000.0000 mL | Freq: Once | INTRAVENOUS | Status: AC
  Administered 2015-10-10: 1000 mL via INTRAVENOUS

## 2015-10-10 NOTE — ED Notes (Signed)
Pt stated that her symptoms began 2 weeks ago. She has lost over 10 lbs in the last month. Pt has hx of GI bleeds and blood transfusions.

## 2015-10-10 NOTE — ED Notes (Signed)
Pt sent here from pcp due to nausea, vomiting, anorexia x 2 weeks. Denies diarrhea. Reports 13lb weight loss.

## 2015-10-10 NOTE — Progress Notes (Signed)
   Subjective:    Patient ID: Connie [name], female    DOB: 10-30-1933, 82 y.o.   MRN: [id number]  HPI He is here for evaluation of continued difficulty with nausea, vomiting, anorexia and abdominal pain. She has then in the process of being evaluated for GI bleeding. She has had an endoscopy, CT done recently both of which were nondiagnostic. In spite of this she has continued to lose weight. She has had a 13 pound weight loss since mid December.   Review of Systems     Objective:   Physical Exam Alert and quite thin and pale appearing.       Assessment & Plan:  Loss of weight  Anorexia  Nausea and vomiting, intractability of vomiting not specified, unspecified vomiting type Case was discussed with Dr. Benson Norway who is her gastroenterologist. He and I both felt that it would be in her best interest to go to the hospital for admission to evaluate for weight loss probable dehydration.

## 2015-10-10 NOTE — H&P (Signed)
[facility] History and Physical  Connie [name] HQR:[id number] DOB: 1934/04/20 DOA: 10/10/2015   PCP: Wyatt Haste, MD  Specialists: Dr. Benson Norway is her gastroenterologist  Chief Complaint: Abdominal pain with nausea and vomiting  HPI: Connie [name] is a 82 y.o. female with a past medical history of diabetes, hypertension, hypothyroidism, hypercholesterolemia, who has had abdominal pain, nausea and vomiting ongoing for the past many weeks. She has a history of iron deficiency anemia. She has been found to have angiodysplasias in her GI tract, which is thought to be the reason for her anemia. She's had multiple upper endoscopies, colonoscopies. She's had CT scan. She also had a double balloon endoscopy at [facility], [facility], [facility] recently. She has been found to have bleeding lesions in her GI tract. Bleeding erosions noted on upper endoscopy. AVMs noted on upper and lower endoscopies. She has been transfused blood for her anemia. However, her symptoms have gotten worse over the last few weeks. She has lost about 14 pounds just in the last 2-3 weeks and has lost about 40 pounds in the last 6-7 months. She has pain in the upper abdomen, which can be about 10 out of 10 in intensity at times. Currently 5 out of 10 in intensity. Denies any chest pain. She has had liquid bowel movements over the last few days. Has felt lightheaded, but denies any syncopal episode. She had a CT scan a few days ago. She went to her primary care physician with these complaints and was referred to the hospital for admission.  In the emergency department. She has been found to have acute renal failure, metabolic acidosis and noted to be profoundly dehydrated. She'll be hospitalized for further management.  Home Medications: Prior to Admission medications   Medication Sig Start Date End Date Taking? Authorizing Provider  amLODipine (NORVASC) 10 MG tablet Take 1 tablet (10 mg total) by mouth every  evening. 07/24/15  Yes [name], MD  aspirin 81 MG tablet Take 81 mg by mouth daily.     Yes Historical Provider, MD  atorvastatin (LIPITOR) 40 MG tablet TAKE ONE TABLET BY MOUTH ONCE DAILY 09/01/15  Yes [name], MD  benazepril-hydrochlorthiazide (LOTENSIN HCT) 20-25 MG per tablet Take 1 tablet by mouth daily. Resume in 2-3 days Patient taking differently: Take 1 tablet by mouth daily.  02/23/15  Yes Eugenie Filler, MD  cloNIDine (CATAPRES) 0.1 MG tablet TAKE ONE TABLET BY MOUTH TWICE DAILY 07/25/14  Yes [name], MD  ferrous sulfate 325 (65 FE) MG tablet Take 1 tablet (325 mg total) by mouth daily with breakfast. 04/03/15  Yes [name], MD  furosemide (LASIX) 20 MG tablet TAKE ONE TABLET BY MOUTH EVERY DAY. Resume in 2-3 days Patient taking differently: Take 20 mg by mouth every morning.  02/23/15  Yes Eugenie Filler, MD  levothyroxine (SYNTHROID, LEVOTHROID) 125 MCG tablet TAKE ONE TABLET BY MOUTH EVERY DAY 10/28/14  Yes [name], MD  metFORMIN (GLUCOPHAGE) 850 MG tablet Take 1 tablet (850 mg total) by mouth daily with breakfast. 07/24/15  Yes [name], MD  pioglitazone (ACTOS) 30 MG tablet Take 1 tablet (30 mg total) by mouth daily. 07/24/15  Yes [name], MD  potassium chloride (KLOR-CON M10) 10 MEQ tablet TAKE ONE TABLET BY MOUTH TWICE DAILY Resume in 2-3 days when you start lasix Patient taking differently: Take 10 mEq by mouth 2 (two) times daily. lasix 02/23/15  Yes Eugenie Filler, MD  promethazine (PHENERGAN) 25 MG tablet Take 25 mg by mouth every 6 (six) hours as needed for nausea or vomiting.   Yes Historical Provider, MD    Allergies: No Known Allergies  Past Medical History: Past Medical History  Diagnosis Date  . Hypertension   . Obesity   . Arthritis   . Allergy     RHINITIS  . Hypothyroidism   . Hypercholesteremia   . Anemia   . Diabetes mellitus     oral meds only    Past Surgical History  Procedure Laterality Date  . Total  thyroidectomy    . Fracture surgery      RIGHT SHOULDER   . Joint replacement      RIGHT KNEE & LEFT KNEE  . Colonoscopy N/A 08/30/2014    Procedure: COLONOSCOPY;  Surgeon: Beryle Beams, MD;  Location: [facility] ENDOSCOPY;  Service: Endoscopy;  Laterality: N/A;  . Hot hemostasis N/A 08/30/2014    Procedure: HOT HEMOSTASIS (ARGON PLASMA COAGULATION/BICAP);  Surgeon: Beryle Beams, MD;  Location: [facility];  Service: Endoscopy;  Laterality: N/A;  . Colonoscopy N/A 02/21/2015    Procedure: COLONOSCOPY;  Surgeon: Carol Ada, MD;  Location: [facility] ENDOSCOPY;  Service: Endoscopy;  Laterality: N/A;  . Givens capsule study N/A 05/15/2015    Procedure: GIVENS CAPSULE STUDY;  Surgeon: Carol Ada, MD;  Location: [place];  Service: Endoscopy;  Laterality: N/A;  . Enteroscopy N/A 06/06/2015    Procedure: ENTEROSCOPY;  Surgeon: Carol Ada, MD;  Location: [facility] ENDOSCOPY;  Service: Endoscopy;  Laterality: N/A;  . Hot hemostasis N/A 06/06/2015    Procedure: HOT HEMOSTASIS (ARGON PLASMA COAGULATION/BICAP);  Surgeon: Carol Ada, MD;  Location: [facility];  Service: Endoscopy;  Laterality: N/A;    Social History: She lives in [place] with her husband. No history of smoking, alcohol use or illicit drug use. Was independent with daily activities prior to onset of the symptoms.  Family History:  Family History  Problem Relation Age of Onset  . Anemia Sister   . Asthma Mother   . Ulcers Father      Review of Systems - History obtained from the patient General ROS: positive for  - fatigue Psychological ROS: negative Ophthalmic ROS: negative ENT ROS: negative Allergy and Immunology ROS: negative Hematological and Lymphatic ROS: negative Endocrine ROS: negative Respiratory ROS: no cough, shortness of breath, or wheezing Cardiovascular ROS: no chest pain or dyspnea on exertion Gastrointestinal ROS: as in hpi Genito-Urinary ROS: no dysuria, trouble voiding, or hematuria Musculoskeletal ROS:  negative Neurological ROS: no TIA or stroke symptoms Dermatological ROS: negative  Physical Examination  Filed Vitals:   10/10/15 2030 10/10/15 2215 10/10/15 2230 10/10/15 2248  BP: 127/32 103/66 120/52 120/45  Pulse: 81 85 80 75  Temp:      TempSrc:      Resp: _0 Weight:      SpO2: 94% 100% 100% 97%    BP 120/45 mmHg  Pulse 75  Temp(Src) 97.4 F (36.3 C) (Oral)  Resp 17  Wt 48.988 kg (108 lb)  SpO2 97%  General appearance: alert, cooperative, appears stated age and no distress Head: Normocephalic, without obvious abnormality, atraumatic Eyes: Extremely dry mucous membranes without any oral lesions. Throat: lips, mucosa, and tongue normal; teeth and gums normal Neck: no adenopathy, no carotid bruit, no JVD, supple, symmetrical, trachea midline and thyroid not enlarged, symmetric, no tenderness/mass/nodules Resp: clear to auscultation bilaterally Cardio: regular rate and rhythm, S1, S2 normal, no murmur, click, rub  or gallop GI: Abdomen is soft. Tender in the epigastric area without any rebound, rigidity or guarding. No masses, organomegaly. Bowel sounds are present. Extremities: extremities normal, atraumatic, no cyanosis or edema Pulses: 2+ and symmetric Skin: Skin color, texture, turgor normal. No rashes or lesions Lymph nodes: Cervical, supraclavicular, and axillary nodes normal. Neurologic: Awake and alert. Oriented 3. Cranial nerves II-12 intact. Motor strength equal bilateral upper and lower extremities.  Laboratory Data: Results for orders placed or performed during the hospital encounter of 10/10/15 (from the past 48 hour(s))  Lipase, blood     Status: None   Collection Time: 10/10/15  4:23 PM  Result Value Ref Range   Lipase 27 11 - 51 U/L  Comprehensive metabolic panel     Status: Abnormal   Collection Time: 10/10/15  4:23 PM  Result Value Ref Range   Sodium 133 (L) 135 - 145 mmol/L   Potassium 3.4 (L) 3.5 - 5.1 mmol/L   Chloride 90 (L) 101 - 111  mmol/L   CO2 19 (L) 22 - 32 mmol/L   Glucose, Bld 125 (H) 65 - 99 mg/dL   BUN 68 (H) 6 - 20 mg/dL   Creatinine, Ser 5.67 (H) 0.44 - 1.00 mg/dL   Calcium 8.8 (L) 8.9 - 10.3 mg/dL   Total Protein 8.1 6.5 - 8.1 g/dL   Albumin 3.5 3.5 - 5.0 g/dL   AST 27 15 - 41 U/L   ALT 7 (L) 14 - 54 U/L   Alkaline Phosphatase 91 38 - 126 U/L   Total Bilirubin 1.1 0.3 - 1.2 mg/dL   GFR calc non Af Amer 6 (L) >60 mL/min   GFR calc Af Amer 7 (L) >60 mL/min    Comment: (NOTE) The eGFR has been calculated using the CKD EPI equation. This calculation has not been validated in all clinical situations. eGFR's persistently <60 mL/min signify possible Chronic Kidney Disease.    Anion gap 24 (H) 5 - 15  CBC     Status: Abnormal   Collection Time: 10/10/15  4:23 PM  Result Value Ref Range   WBC 21.5 (H) 4.0 - 10.5 K/uL   RBC 4.40 3.87 - 5.11 MIL/uL   Hemoglobin 11.1 (L) 12.0 - 15.0 g/dL   HCT 34.7 (L) 36.0 - 46.0 %   MCV 78.9 78.0 - 100.0 fL   MCH 25.2 (L) 26.0 - 34.0 pg   MCHC 32.0 30.0 - 36.0 g/dL   RDW 15.9 (H) 11.5 - 15.5 %   Platelets 667 (H) 150 - 400 K/uL  Troponin I     Status: Abnormal   Collection Time: 10/10/15  8:43 PM  Result Value Ref Range   Troponin I 0.08 (H) <0.031 ng/mL    Comment:        PERSISTENTLY INCREASED TROPONIN VALUES IN THE RANGE OF 0.04-0.49 ng/mL CAN BE SEEN IN:       -UNSTABLE ANGINA       -CONGESTIVE HEART FAILURE       -MYOCARDITIS       -CHEST TRAUMA       -ARRYHTHMIAS       -LATE PRESENTING MYOCARDIAL INFARCTION       -COPD   CLINICAL FOLLOW-UP RECOMMENDED.   POC occult blood, ED Provider will collect     Status: Abnormal   Collection Time: 10/10/15 10:18 PM  Result Value Ref Range   Fecal Occult Bld POSITIVE (A) NEGATIVE    Radiology Reports: US Abdomen Complete  10/10/2015  CLINICAL DATA:  Epigastric abdominal pain. EXAM: ABDOMEN ULTRASOUND COMPLETE COMPARISON:  CT of the abdomen and pelvis 10/07/2015 FINDINGS: Gallbladder: There several layering  gallstones the largest of which measures 9 mm. Gallbladder wall measures 1.7 mm in thickness. No secondary signs of acute cholecystitis. Sonographic Murphy's sign was negative. Common bile duct: Diameter: 4 mm. Liver: No focal lesion identified. Within normal limits in parenchymal echogenicity. IVC: No abnormality visualized. Pancreas: The pancreas is atrophic. Main pancreatic duct is irregular and dilated measuring 4 mm in cross-section. Spleen: Size and appearance within normal limits. Right Kidney: Length: 10.5 cm. No mass or hydronephrosis visualized. There are several right renal cysts, the largest in the lower pole measures 2.9 cm. Left Kidney: Length: 9.4 cm. Echogenicity within normal limits. No mass or hydronephrosis visualized. Abdominal aorta: No aneurysm visualized. Atherosclerotic calcifications are noted. Other findings: None. IMPRESSION: Atrophic pancreas with irregularly dilated main pancreatic duct, measuring up to 4 mm. Cholelithiasis without evidence of cholecystitis. Atherosclerotic disease of the aorta. Right renal cysts. Electronically Signed   By: Fidela Salisbury M.D.   On: 10/10/2015 22:17   Dg Abd Acute W/chest  10/10/2015  CLINICAL DATA:  Nausea vomiting anorexia abdominal pain, GI bleeding, 13 pound weight loss in the past month EXAM: DG ABDOMEN ACUTE W/ 1V CHEST COMPARISON:  None. FINDINGS: The heart size and vascular pattern are normal. The lungs are clear. There is no free air. Significant scoliosis of the lumbar spine convex left. No abnormally dilated loops of bowel. Nonobstructive gas pattern. IMPRESSION: Nonobstructive bowel gas pattern. No acute cardiopulmonary abnormality. Electronically Signed   By: Skipper Cliche M.D.   On: 10/10/2015 21:41    My interpretation of Electrocardiogram: Sinus rhythm in the 80s. Baseline wander is noted. Intervals are normal. No Q waves. Normal axis. Nonspecific T-wave changes. No concerning ST changes.  Problem List  Principal  Problem:   Acute renal failure (ARF) ([name]) Active Problems:   Hypertension associated with diabetes ([place])   Hypothyroidism   Hyperlipidemia associated with type 2 diabetes mellitus ([place])   Type 2 diabetes with complication ([name])   Angiodysplasia of stomach and duodenum with hemorrhage   Gastric ulcer with hemorrhage   Iron deficiency anemia secondary to blood loss (chronic)   Metabolic acidosis   Elevated troponin   Assessment: This is a 82 year old Caucasian female with past medical history as stated earlier, who presents with continued abdominal pain, nausea, vomiting. The symptoms have been ongoing for a few weeks but worse in the last 1-2 weeks. She is profoundly dehydrated with acute renal failure. She is noted to have increased anion gap metabolic acidosis which is most likely due to hypovolemia and dehydration. Her glucose is not that significantly high. This is unlikely to be diabetic ketoacidosis. Beta hydroxybutyrate is pending.  Plan: #1 acute renal failure with the elevated anion gap metabolic acidosis: This is all secondary to dehydration from her nausea and vomiting. She is also on diuretics at home which could have exacerbated her condition. She is making urine. Ultrasound did not show any hydronephrosis. She'll be given IV fluids. Monitor urine output. Monitor her metabolic acidosis and anion gap closely. Follow-up on beta hydroxybutyrate.  #2 nausea, vomiting and upper abdominal pain: Etiology remains unclear. She's had numerous upper endoscopies and colonoscopies. She is followed by Dr. Benson Norway who is aware that the patient is in the hospital. We will need his assistance for further management and evaluation. CT scan done recently in an outside facility does suggest pancreatic ductal abnormalities and atrophic pancreas.  This may need further workup, but will defer to gastroenterology. She has been noted to have AVMs which have bled in the past. However, she has been told not to take  any Antacids. Hold off on PPI for now. No evidence for active bleeding currently. Ultrasound also showed cholelithiasis without any evidence for cholecystitis. LFTs are within normal range. Lipase is normal.  #3 history of iron deficiency anemia due to chronic blood loss: Hemoglobin is stable. Monitor closely. Check anemia panel in the morning including B-12 levels.  #4 history of essential hypertension: Monitor blood pressures closely. Blood pressure is reasonably well controlled at this time.  #5 Diabetes mellitus type 2: Place her on sliding scale coverage. Check HbA1c in the morning.  #6 history of hypothyroidism: Continue with Synthroid.  #7 Mildly elevated troponin: Patient denies any chest pain. EKG does not show any ischemic changes. This is most likely due to demand ischemia versus acute renal failure. These will be trended. Consider echocardiogram if significantly elevated.  #8 Leukocytosis and thrombocytosis: These are reflective of her dehydrated state. No evidence for infection. Hold off on antibiotics. Repeat labs in the morning after adequate hydration.   DVT Prophylaxis: SCDs Code Status: Full code Family Communication: Discussed with the patient and her husband and son  Disposition Plan: Admit to telemetry   Further management decisions will depend on results of further testing and patient's response to treatment.   [facility]  [facility] Pager [phone]  If 7PM-7AM, please contact night-coverage [email or web address] Password [facility]  10/10/2015, 11:27 PM

## 2015-10-10 NOTE — ED Provider Notes (Signed)
CSN: [id number]     Arrival date & time 10/10/15  1616 History   First MD Initiated Contact with Patient 10/10/15 2006     Chief Complaint  Patient presents with  . Emesis     (Consider location/radiation/quality/duration/timing/severity/associated sxs/prior Treatment) The history is provided by the patient and a relative.     Connie [name] is a 82 y.o. female with hx of HTN, thyroid disease, DM, GERD, GI bleed with two blood transfusion since May 2016, she presents to the emergency department at the direction of her PCP, for evaluation of 2 weeks of persistent postprandial epigastric pain with nausea and vomiting.  Patient states she had a double balloon endoscopy 2 weeks ago when the symptoms began.  She reports vomiting with Jell-O and boost drinks, but she is able to drink clear liquids occasionally without vomiting.  Emesis is NBNB.  Her pain is located superior to her umbilicus extending to epigastrium and is described as burning, worse with eating, with radiation up chest and to back.  He pain is also present at rest, described as achy, currently rated 5/10.  She has unintentionally lost 13 lbs in the past two weeks, and feels generally weak and fatigued all the time, with periodic lightheadedness.  Pt denies CP, SOB or near syncope.  Family members state she is acting normally, no AMS, no confusion.  Pt has been taking iron supplements and reports dark stools, she denies hematochezia, diarrhea.  Last BM was yesterday.  She denies urinary sx and vaginal sx.  The pt has continued to take all of her home medications including lasix, HTN meds, DM medication.   The patient reports that she did have 2 blood transfusions in 2016 in May and November.  She states that today she only took Zofran and Phenergan and no other home medications. She last drink water and coffee this morning but didn't open it up. She attempted to eat Jell-O at 90 and also vomited that back up.  Past Medical History    Diagnosis Date  . Hypertension   . Obesity   . Arthritis   . Allergy     RHINITIS  . Hypothyroidism   . Hypercholesteremia   . Anemia   . Diabetes mellitus     oral meds only   Past Surgical History  Procedure Laterality Date  . Total thyroidectomy    . Fracture surgery      RIGHT SHOULDER   . Joint replacement      RIGHT KNEE & LEFT KNEE  . Colonoscopy N/A 08/30/2014    Procedure: COLONOSCOPY;  Surgeon: Beryle Beams, MD;  Location: [facility];  Service: Endoscopy;  Laterality: N/A;  . Hot hemostasis N/A 08/30/2014    Procedure: HOT HEMOSTASIS (ARGON PLASMA COAGULATION/BICAP);  Surgeon: Beryle Beams, MD;  Location: [facility];  Service: Endoscopy;  Laterality: N/A;  . Colonoscopy N/A 02/21/2015    Procedure: COLONOSCOPY;  Surgeon: Carol Ada, MD;  Location: [facility] ENDOSCOPY;  Service: Endoscopy;  Laterality: N/A;  . Givens capsule study N/A 05/15/2015    Procedure: GIVENS CAPSULE STUDY;  Surgeon: Carol Ada, MD;  Location: [place];  Service: Endoscopy;  Laterality: N/A;  . Enteroscopy N/A 06/06/2015    Procedure: ENTEROSCOPY;  Surgeon: Carol Ada, MD;  Location: [facility];  Service: Endoscopy;  Laterality: N/A;  . Hot hemostasis N/A 06/06/2015    Procedure: HOT HEMOSTASIS (ARGON PLASMA COAGULATION/BICAP);  Surgeon: Carol Ada, MD;  Location: [facility];  Service: Endoscopy;  Laterality: N/A;  Family History  Problem Relation Age of Onset  . Anemia Sister   . Asthma Mother   . Ulcers Father    Social History  Substance Use Topics  . Smoking status: Never Smoker   . Smokeless tobacco: Never Used  . Alcohol Use: No   OB History    No data available     Review of Systems  Constitutional: Positive for fatigue and unexpected weight change. Negative for fever, chills, diaphoresis and appetite change.  HENT: Negative.   Eyes: Negative.   Respiratory: Negative.  Negative for cough, chest tightness, shortness of breath and wheezing.   Cardiovascular:  Negative.  Negative for chest pain, palpitations and leg swelling.  Gastrointestinal: Positive for nausea, vomiting and abdominal pain. Negative for diarrhea, constipation, blood in stool, abdominal distention, anal bleeding and rectal pain.  Endocrine: Negative.   Genitourinary: Negative.   Musculoskeletal: Negative.   Skin: Negative.  Negative for color change and pallor.  Allergic/Immunologic: Negative.   Neurological: Positive for weakness and light-headedness. Negative for dizziness, syncope, facial asymmetry and headaches.  Hematological: Negative.   Psychiatric/Behavioral: Negative.        Allergies  Review of patient's allergies indicates no known allergies.  Home Medications   Prior to Admission medications   Medication Sig Start Date End Date Taking? Authorizing Provider  amLODipine (NORVASC) 10 MG tablet Take 1 tablet (10 mg total) by mouth every evening. 07/24/15  Yes [name], MD  aspirin 81 MG tablet Take 81 mg by mouth daily.     Yes Historical Provider, MD  atorvastatin (LIPITOR) 40 MG tablet TAKE ONE TABLET BY MOUTH ONCE DAILY 09/01/15  Yes [name], MD  benazepril-hydrochlorthiazide (LOTENSIN HCT) 20-25 MG per tablet Take 1 tablet by mouth daily. Resume in 2-3 days Patient taking differently: Take 1 tablet by mouth daily.  02/23/15  Yes Eugenie Filler, MD  cloNIDine (CATAPRES) 0.1 MG tablet TAKE ONE TABLET BY MOUTH TWICE DAILY 07/25/14  Yes [name], MD  ferrous sulfate 325 (65 FE) MG tablet Take 1 tablet (325 mg total) by mouth daily with breakfast. 04/03/15  Yes [name], MD  furosemide (LASIX) 20 MG tablet TAKE ONE TABLET BY MOUTH EVERY DAY. Resume in 2-3 days Patient taking differently: Take 20 mg by mouth every morning.  02/23/15  Yes Eugenie Filler, MD  levothyroxine (SYNTHROID, LEVOTHROID) 125 MCG tablet TAKE ONE TABLET BY MOUTH EVERY DAY 10/28/14  Yes [name], MD  metFORMIN (GLUCOPHAGE) 850 MG tablet Take 1 tablet (850 mg total) by  mouth daily with breakfast. 07/24/15  Yes [name], MD  pioglitazone (ACTOS) 30 MG tablet Take 1 tablet (30 mg total) by mouth daily. 07/24/15  Yes [name], MD  potassium chloride (KLOR-CON M10) 10 MEQ tablet TAKE ONE TABLET BY MOUTH TWICE DAILY Resume in 2-3 days when you start lasix Patient taking differently: Take 10 mEq by mouth 2 (two) times daily. lasix 02/23/15  Yes Eugenie Filler, MD  promethazine (PHENERGAN) 25 MG tablet Take 25 mg by mouth every 6 (six) hours as needed for nausea or vomiting.   Yes Historical Provider, MD   BP 128/42 mmHg  Pulse 70  Temp(Src) 99.9 F (37.7 C) (Oral)  Resp 16  Ht 4\' 11"  (1.499 m)  Wt 48.988 kg  BMI 21.80 kg/m2  SpO2 97% Physical Exam  Constitutional: She is oriented to person, place, and time. She appears well-developed and well-nourished. No distress.  Thin elderly  female, non-toxic in appearance, NAD  HENT:  Head: Normocephalic and atraumatic.  Nose: Nose normal.  Mouth/Throat: No oropharyngeal exudate.  Eyes: Conjunctivae and EOM are normal. Pupils are equal, round, and reactive to light. Right eye exhibits no discharge. Left eye exhibits no discharge. No scleral icterus.  Neck: Normal range of motion. Neck supple. No JVD present. No tracheal deviation present. No thyromegaly present.  Low neck scar  Cardiovascular: Normal rate, regular rhythm, normal heart sounds and intact distal pulses.  Exam reveals no gallop and no friction rub.   No murmur heard. Pulmonary/Chest: Effort normal and breath sounds normal. No accessory muscle usage. No tachypnea. No respiratory distress. She has no wheezes. She has no rales. She exhibits no tenderness.  BS slightly diminished bilaterally at the bases, no wheeze, no rhonchi, no rales  Abdominal: Soft. Normal appearance and bowel sounds are normal. She exhibits no distension and no mass. There is tenderness in the epigastric area and periumbilical area. There is guarding. There is no rigidity, no  rebound, no CVA tenderness, no tenderness at McBurney's point and negative Murphy's sign.    Genitourinary: Rectal exam shows tenderness. Rectal exam shows no mass and anal tone normal.  DRE tender, small streak of visible blood from rectal exam, no anal fissure noted, stool light brown in color, soft Hemoccult + may be due to small amount of blood on exam, no frank blood, no tarry dark stools  Musculoskeletal: Normal range of motion. She exhibits no edema or tenderness.  Lymphadenopathy:    She has no cervical adenopathy.  Neurological: She is alert and oriented to person, place, and time. She has normal reflexes. No cranial nerve deficit. She exhibits normal muscle tone. Coordination normal.  Skin: Skin is warm and dry. No rash noted. She is not diaphoretic. No erythema. No pallor.  Psychiatric: She has a normal mood and affect. Her behavior is normal. Judgment and thought content normal. Her speech is delayed.  Nursing note and vitals reviewed.   ED Course  Procedures (including critical care time) Labs Review Labs Reviewed  COMPREHENSIVE METABOLIC PANEL - Abnormal; Notable for the following:    Sodium 133 (*)    Potassium 3.4 (*)    Chloride 90 (*)    CO2 19 (*)    Glucose, Bld 125 (*)    BUN 68 (*)    Creatinine, Ser 5.67 (*)    Calcium 8.8 (*)    ALT 7 (*)    GFR calc non Af Amer 6 (*)    GFR calc Af Amer 7 (*)    Anion gap 24 (*)    All other components within normal limits  CBC - Abnormal; Notable for the following:    WBC 21.5 (*)    Hemoglobin 11.1 (*)    HCT 34.7 (*)    MCH 25.2 (*)    RDW 15.9 (*)    Platelets 667 (*)    All other components within normal limits  URINALYSIS, ROUTINE W REFLEX MICROSCOPIC (NOT AT [facility]) - Abnormal; Notable for the following:    APPearance CLOUDY (*)    Bilirubin Urine MODERATE (*)    Ketones, ur 15 (*)    Leukocytes, UA SMALL (*)    All other components within normal limits  TROPONIN I - Abnormal; Notable for the following:     Troponin I 0.08 (*)    All other components within normal limits  URINE MICROSCOPIC-ADD ON - Abnormal; Notable for the following:    Squamous  Epithelial / LPF 0-5 (*)    Bacteria, UA RARE (*)    Casts HYALINE CASTS (*)    All other components within normal limits  VITAMIN B12 - Abnormal; Notable for the following:    Vitamin B-12 1591 (*)    All other components within normal limits  IRON AND TIBC - Abnormal; Notable for the following:    Iron 18 (*)    Saturation Ratios 6 (*)    All other components within normal limits  T4, FREE - Abnormal; Notable for the following:    Free T4 1.16 (*)    All other components within normal limits  CORTISOL-AM, BLOOD - Abnormal; Notable for the following:    Cortisol - AM 34.2 (*)    All other components within normal limits  BETA-HYDROXYBUTYRIC ACID - Abnormal; Notable for the following:    Beta-Hydroxybutyric Acid 4.07 (*)    All other components within normal limits  TROPONIN I - Abnormal; Notable for the following:    Troponin I 0.05 (*)    All other components within normal limits  TROPONIN I - Abnormal; Notable for the following:    Troponin I 0.12 (*)    All other components within normal limits  TROPONIN I - Abnormal; Notable for the following:    Troponin I 0.08 (*)    All other components within normal limits  COMPREHENSIVE METABOLIC PANEL - Abnormal; Notable for the following:    Potassium 3.1 (*)    Chloride 99 (*)    CO2 20 (*)    BUN 58 (*)    Creatinine, Ser 4.00 (*)    Calcium 8.1 (*)    Total Protein 6.4 (*)    Albumin 2.9 (*)    ALT 6 (*)    GFR calc non Af Amer 10 (*)    GFR calc Af Amer 11 (*)    Anion gap 18 (*)    All other components within normal limits  CBC - Abnormal; Notable for the following:    WBC 15.2 (*)    Hemoglobin 10.3 (*)    HCT 32.3 (*)    MCH 25.3 (*)    RDW 16.1 (*)    Platelets 558 (*)    All other components within normal limits  GLUCOSE, CAPILLARY - Abnormal; Notable for the following:     Glucose-Capillary 113 (*)    All other components within normal limits  GLUCOSE, CAPILLARY - Abnormal; Notable for the following:    Glucose-Capillary 157 (*)    All other components within normal limits  POC OCCULT BLOOD, ED - Abnormal; Notable for the following:    Fecal Occult Bld POSITIVE (*)    All other components within normal limits  LIPASE, BLOOD  FOLATE  FERRITIN  RETICULOCYTES  TSH  PROTIME-INR  GLUCOSE, CAPILLARY  GLUCOSE, CAPILLARY  GLUCOSE, CAPILLARY  GLUCOSE, CAPILLARY  HEMOGLOBIN [id number]  BASIC METABOLIC PANEL  CBG MONITORING, ED    Imaging Review US Abdomen Complete  10/10/2015  CLINICAL DATA:  Epigastric abdominal pain. EXAM: ABDOMEN ULTRASOUND COMPLETE COMPARISON:  CT of the abdomen and pelvis 10/07/2015 FINDINGS: Gallbladder: There several layering gallstones the largest of which measures 9 mm. Gallbladder wall measures 1.7 mm in thickness. No secondary signs of acute cholecystitis. Sonographic Murphy's sign was negative. Common bile duct: Diameter: 4 mm. Liver: No focal lesion identified. Within normal limits in parenchymal echogenicity. IVC: No abnormality visualized. Pancreas: The pancreas is atrophic. Main pancreatic duct is irregular and dilated measuring 4 mm  in cross-section. Spleen: Size and appearance within normal limits. Right Kidney: Length: 10.5 cm. No mass or hydronephrosis visualized. There are several right renal cysts, the largest in the lower pole measures 2.9 cm. Left Kidney: Length: 9.4 cm. Echogenicity within normal limits. No mass or hydronephrosis visualized. Abdominal aorta: No aneurysm visualized. Atherosclerotic calcifications are noted. Other findings: None. IMPRESSION: Atrophic pancreas with irregularly dilated main pancreatic duct, measuring up to 4 mm. Cholelithiasis without evidence of cholecystitis. Atherosclerotic disease of the aorta. Right renal cysts. Electronically Signed   By: Fidela Salisbury M.D.   On: 10/10/2015 22:17   Dg Abd  Acute W/chest  10/10/2015  CLINICAL DATA:  Nausea vomiting anorexia abdominal pain, GI bleeding, 13 pound weight loss in the past month EXAM: DG ABDOMEN ACUTE W/ 1V CHEST COMPARISON:  None. FINDINGS: The heart size and vascular pattern are normal. The lungs are clear. There is no free air. Significant scoliosis of the lumbar spine convex left. No abnormally dilated loops of bowel. Nonobstructive gas pattern. IMPRESSION: Nonobstructive bowel gas pattern. No acute cardiopulmonary abnormality. Electronically Signed   By: Skipper Cliche M.D.   On: 10/10/2015 21:41   I have personally reviewed and evaluated these images and lab results as part of my medical decision-making.   EKG Interpretation   Date/Time:  Friday October 10 2015 20:30:03 EST Ventricular Rate:  83 PR Interval:  144 QRS Duration: 94 QT Interval:  411 QTC Calculation: 483 R Axis:   52 Text Interpretation:  Sinus rhythm Borderline T abnormalities, anterior  leads Baseline wander in lead(s) III aVF nonspecific changes since  previous  Confirmed by YAO  MD, [name] ([id number]) on 10/10/2015 8:56:22 PM      MDM   Pt with epigastric abdominal pain, worse with eating, with N and V x 2 weeks, with associated 13 lb weightloss.  She was sent from her PCP office.  Dr. Benson Norway is her GI, aware of her coming to [facility].  Labs obtained and resulted, currently pending troponin, hemoccult, Acute abd and chest, abdominal US, UA.  Pt had Ct abd/pelvis 3 days ago.  ddx acute cholecystitis, gastric ulcer, pancreatitis, given the patient's appearance and abdominal exam, do not suspect perforation or obstruction.  Results significant for leukocytosis of 21.5, acute renal failure with elevated BUN and creatinine, AG of 24.    The patient states she has continued to take all of her home medications including Lasix, hypertension meds, diabetes medication, suspect degree of acute renal failure from dehydration  Remaining labs resulted with positive troponin  of 0.08, suspect this is likely demand ischemia due to severe dehydration. Abdominal ultrasound is significant for pancreatic duct irregularity and dilatation with pancreatic atrophy.  Gallstones are again noted, without any sign of acute cholecystitis.  CBD normal diameter.  Pt admitted to tele for further workup.    Final diagnoses:  Epigastric abdominal pain  Acute renal failure, unspecified acute renal failure type [facility])      Delsa Grana, PA-C 10/12/15 [id number]  Wandra Arthurs, MD 10/13/15 [phone]

## 2015-10-11 DIAGNOSIS — E1159 Type 2 diabetes mellitus with other circulatory complications: Secondary | ICD-10-CM

## 2015-10-11 DIAGNOSIS — I10 Essential (primary) hypertension: Secondary | ICD-10-CM

## 2015-10-11 DIAGNOSIS — K31811 Angiodysplasia of stomach and duodenum with bleeding: Secondary | ICD-10-CM

## 2015-10-11 DIAGNOSIS — E785 Hyperlipidemia, unspecified: Secondary | ICD-10-CM

## 2015-10-11 DIAGNOSIS — E039 Hypothyroidism, unspecified: Secondary | ICD-10-CM

## 2015-10-11 DIAGNOSIS — N179 Acute kidney failure, unspecified: Principal | ICD-10-CM

## 2015-10-11 DIAGNOSIS — E1169 Type 2 diabetes mellitus with other specified complication: Secondary | ICD-10-CM

## 2015-10-11 DIAGNOSIS — E118 Type 2 diabetes mellitus with unspecified complications: Secondary | ICD-10-CM

## 2015-10-11 LAB — CORTISOL-AM, BLOOD: Cortisol - AM: 34.2 ug/dL — ABNORMAL HIGH (ref 6.7–22.6)

## 2015-10-11 LAB — CBC
HCT: 32.3 % — ABNORMAL LOW (ref 36.0–46.0)
HEMOGLOBIN: 10.3 g/dL — AB (ref 12.0–15.0)
MCH: 25.3 pg — AB (ref 26.0–34.0)
MCHC: 31.9 g/dL (ref 30.0–36.0)
MCV: 79.4 fL (ref 78.0–100.0)
PLATELETS: 558 10*3/uL — AB (ref 150–400)
RBC: 4.07 MIL/uL (ref 3.87–5.11)
RDW: 16.1 % — ABNORMAL HIGH (ref 11.5–15.5)
WBC: 15.2 10*3/uL — ABNORMAL HIGH (ref 4.0–10.5)

## 2015-10-11 LAB — COMPREHENSIVE METABOLIC PANEL
ALBUMIN: 2.9 g/dL — AB (ref 3.5–5.0)
ALK PHOS: 74 U/L (ref 38–126)
ALT: 6 U/L — AB (ref 14–54)
ANION GAP: 18 — AB (ref 5–15)
AST: 18 U/L (ref 15–41)
BILIRUBIN TOTAL: 0.7 mg/dL (ref 0.3–1.2)
BUN: 58 mg/dL — ABNORMAL HIGH (ref 6–20)
CALCIUM: 8.1 mg/dL — AB (ref 8.9–10.3)
CO2: 20 mmol/L — AB (ref 22–32)
CREATININE: 4 mg/dL — AB (ref 0.44–1.00)
Chloride: 99 mmol/L — ABNORMAL LOW (ref 101–111)
GFR calc non Af Amer: 10 mL/min — ABNORMAL LOW (ref >60)
GFR, EST AFRICAN AMERICAN: 11 mL/min — AB (ref >60)
GLUCOSE: 66 mg/dL (ref 65–99)
Potassium: 3.1 mmol/L — ABNORMAL LOW (ref 3.5–5.1)
Sodium: 137 mmol/L (ref 135–145)
TOTAL PROTEIN: 6.4 g/dL — AB (ref 6.5–8.1)

## 2015-10-11 LAB — GLUCOSE, CAPILLARY
GLUCOSE-CAPILLARY: 85 mg/dL (ref 65–99)
GLUCOSE-CAPILLARY: 85 mg/dL (ref 65–99)
Glucose-Capillary: 65 mg/dL (ref 65–99)
Glucose-Capillary: 76 mg/dL (ref 65–99)
Glucose-Capillary: 113 mg/dL — ABNORMAL HIGH (ref 65–99)
Glucose-Capillary: 157 mg/dL — ABNORMAL HIGH (ref 65–99)

## 2015-10-11 LAB — VITAMIN B12: Vitamin B-12: 1591 pg/mL — ABNORMAL HIGH (ref 180–914)

## 2015-10-11 LAB — RETICULOCYTES
RBC.: 4.07 MIL/uL (ref 3.87–5.11)
RETIC CT PCT: 1.1 % (ref 0.4–3.1)
Retic Count, Absolute: 44.8 10*3/uL (ref 19.0–186.0)

## 2015-10-11 LAB — TROPONIN I
TROPONIN I: 0.05 ng/mL — AB (ref <0.031)
Troponin I: 0.12 ng/mL — ABNORMAL HIGH (ref <0.031)
Troponin I: 0.08 ng/mL — ABNORMAL HIGH (ref <0.031)

## 2015-10-11 LAB — IRON AND TIBC
Iron: 18 ug/dL — ABNORMAL LOW (ref 28–170)
SATURATION RATIOS: 6 % — AB (ref 10.4–31.8)
TIBC: 294 ug/dL (ref 250–450)
UIBC: 276 ug/dL

## 2015-10-11 LAB — PROTIME-INR
INR: 1.16 (ref 0.00–1.49)
PROTHROMBIN TIME: 15 s (ref 11.6–15.2)

## 2015-10-11 LAB — FERRITIN: FERRITIN: 37 ng/mL (ref 11–307)

## 2015-10-11 LAB — TSH: TSH: 0.472 u[IU]/mL (ref 0.350–4.500)

## 2015-10-11 LAB — T4, FREE: Free T4: 1.16 ng/dL — ABNORMAL HIGH (ref 0.61–1.12)

## 2015-10-11 LAB — BETA-HYDROXYBUTYRIC ACID: Beta-Hydroxybutyric Acid: 4.07 mmol/L — ABNORMAL HIGH (ref 0.05–0.27)

## 2015-10-11 LAB — FOLATE: Folate: 28.4 ng/mL (ref >5.9)

## 2015-10-11 MED ORDER — SODIUM CHLORIDE 0.9 % IJ SOLN
3.0000 mL | Freq: Two times a day (BID) | INTRAMUSCULAR | Status: DC
  Administered 2015-10-11: 3 mL via INTRAVENOUS

## 2015-10-11 MED ORDER — ADULT MULTIVITAMIN W/MINERALS CH
1.0000 | ORAL_TABLET | Freq: Every day | ORAL | Status: DC
  Administered 2015-10-11 – 2015-10-12 (×2): 1 via ORAL
  Filled 2015-10-11 (×2): qty 1

## 2015-10-11 MED ORDER — ONDANSETRON HCL 4 MG PO TABS: 4.0000 mg | ORAL_TABLET | Freq: Four times a day (QID) | ORAL | Status: DC | PRN

## 2015-10-11 MED ORDER — MORPHINE SULFATE (PF) 2 MG/ML IV SOLN
1.0000 mg | INTRAVENOUS | Status: DC | PRN
Start: 2015-10-11 — End: 2015-10-12

## 2015-10-11 MED ORDER — BOOST / RESOURCE BREEZE PO LIQD
1.0000 | Freq: Two times a day (BID) | ORAL | Status: DC
  Administered 2015-10-12: 1 via ORAL

## 2015-10-11 MED ORDER — SODIUM CHLORIDE 0.9 % IV SOLN
INTRAVENOUS | Status: AC
  Administered 2015-10-11: 11:00:00 via INTRAVENOUS
  Administered 2015-10-11: 100 mL via INTRAVENOUS

## 2015-10-11 MED ORDER — HYDRALAZINE HCL 20 MG/ML IJ SOLN: 10.0000 mg | Freq: Four times a day (QID) | INTRAMUSCULAR | Status: DC | PRN

## 2015-10-11 MED ORDER — ONDANSETRON HCL 4 MG/2ML IJ SOLN: 4.0000 mg | Freq: Four times a day (QID) | INTRAMUSCULAR | Status: DC | PRN

## 2015-10-11 MED ORDER — ACETAMINOPHEN 325 MG PO TABS
650.0000 mg | ORAL_TABLET | Freq: Four times a day (QID) | ORAL | Status: DC | PRN
Start: 2015-10-11 — End: 2015-10-12
  Filled 2015-10-11: qty 2

## 2015-10-11 MED ORDER — ACETAMINOPHEN 650 MG RE SUPP: 650.0000 mg | Freq: Four times a day (QID) | RECTAL | Status: DC | PRN

## 2015-10-11 MED ORDER — LEVOTHYROXINE SODIUM 125 MCG PO TABS
125.0000 ug | ORAL_TABLET | Freq: Every day | ORAL | Status: DC
Start: 2015-10-11 — End: 2015-10-12
  Administered 2015-10-11 – 2015-10-12 (×2): 125 ug via ORAL
  Filled 2015-10-11 (×2): qty 1

## 2015-10-11 MED ORDER — INSULIN ASPART 100 UNIT/ML ~~LOC~~ SOLN
0.0000 [IU] | Freq: Three times a day (TID) | SUBCUTANEOUS | Status: DC
  Administered 2015-10-11: 2 [IU] via SUBCUTANEOUS
  Administered 2015-10-12: 1 [IU] via SUBCUTANEOUS

## 2015-10-11 NOTE — Progress Notes (Signed)
Initial Nutrition Assessment  DOCUMENTATION CODES:  Severe malnutrition in context of chronic illness   Pt meets criteria for SEVERE MALNUTRITION in the context of CHRONIC ILLNESS as evidenced by Loss of >27% bw in 6 months and and an oral intake of  < 75% of estimated needs for > 1 month .  INTERVENTION:  Boost Breeze po BID, each supplement provides 250 kcal and 9 grams of protein  MVI with minerals  NUTRITION DIAGNOSIS:  Inadequate oral intake related to nausea, vomiting, Abominal pain as evidenced by loss of 27% bw in 6 months, an oral intake of <75% of estimated needs for >1 month, mild fat wasting and moderate muscle wasting.  GOAL:  Patient will meet greater than or equal to 90% of their needs  MONITOR:  PO intake, Supplement acceptance, Diet advancement, Labs, Work up  REASON FOR ASSESSMENT:  Malnutrition Screening Tool    ASSESSMENT:  82 y/o female PMhx DM, HTN, Hypothyroidism, Hypercholesterolemia who presents with ongoing n/v and abdominal pain for 2-3 weeks. She has been found to have multiple bleeding lesions in her GI tract thought to be cause of anemia. She was admitted for acute renal failure related to profound dehydration, likely from vomiting/diarrhea. Currently cause of n/v abd pain unclear.    Pt reports that she has been having n/v and some pain for 6-7 months however it has significantly worsened over the last 2-3 weeks to the point where she will vomit almost any food. She can only tolerate liquids and even some of those come back up. She says she is eating <25% of her normal amount now. She tried Ensure at home, but threw this up. She says she has been essentially drinking juice/soda and other thin liquids. She did not follow a DM diet at home. She took a daily mvi  She reports her normal weight as 148 lbs which she weighed in June. EMR documentation confirms this. She has lost 27% of her BW since then.   NFPE: Mild-moderate muscle wasting, mild fat wasting.  Slightly sunken face. No overt signs of deficiency  Diet Order:  Diet clear liquid Room service appropriate?: Yes; Fluid consistency:: Thin  Skin:  Reviewed, no issues  Last BM:  1/21-diarrhea-incontinent  Height:  Ht Readings from Last 1 Encounters:  10/11/15 4\' 11"  (1.499 m)   Weight:  Wt Readings from Last 1 Encounters:  10/11/15 108 lb (48.988 kg)   Wt Readings from Last 10 Encounters:  10/11/15 108 lb (48.988 kg)  10/10/15 108 lb (48.988 kg)  09/01/15 121 lb 6.4 oz (55.067 kg)  06/06/15 135 lb (61.236 kg)  05/19/15 136 lb 12.8 oz (62.052 kg)  05/09/15 137 lb (62.143 kg)  05/01/15 136 lb (61.689 kg)  04/03/15 142 lb (64.411 kg)  02/27/15 148 lb (67.132 kg)  02/21/15 151 lb 3.2 oz (68.584 kg)   Ideal Body Weight:  44.7 kg  BMI:  Body mass index is 21.8 kg/(m^2).  Estimated Nutritional Needs:  Kcal:  1300-1500 (27-31 kcal/kg bw) Protein:  50-60 g Pro Fluid:  > 1.5 liters (30 ml/kg bw)  EDUCATION NEEDS:  No education needs identified at this time  Burtis Junes RD, LDN Nutrition Pager: [phone] 10/11/2015 12:36 PM

## 2015-10-11 NOTE — Progress Notes (Signed)
Hypoglycemic Event  CBG: 65  Treatment: carb snack  Symptoms: none  Follow-up CBG: Time:08:18 CBG Result:76  Possible Reasons for Event: unknown  Comments/MD notified: yes    [name]

## 2015-10-11 NOTE — Progress Notes (Signed)
Arrival Method: Patient arrived in stretcher from ED. Mental Orientation: alert and oriented Telemetry:12 Assessment: See Doc Flow sheets. Skin: Warm, dry and intact. IV: Peripheral / right  A/c I Pain:.denies Fall Prevention Safety Plan: Patient educated about fall prevention safety plan, understood and acknowledged. Admission Screening:6700 Orientation: Patient has been oriented to the unit, staff and to the room.

## 2015-10-11 NOTE — Evaluation (Signed)
Physical Therapy Evaluation Patient Details Name: Connie [name] MRN: [id number] DOB: 1934/07/26 Today's Date: 10/11/2015   History of Present Illness  Patient admitted with N/V, diarrhea and abdominal pain over past few weeks.  Work up also revealed acute renal failure with the elevated anion gap metabolic acidosis. PMH significant for  diabetes, hypertension, hypothyroidism, hypercholesterolemia, a history of iron deficiency anemia. She has been found to have angiodysplasias in her GI tract, which is thought to be the reason for her anemia. She's had multiple upper endoscopies, colonoscopies. She's had CT scan. She also had a double balloon endoscopy at [facility] recently. She has been found to have bleeding lesions in her GI tract. Bleeding erosions noted on upper endoscopy. AVMs noted on upper and lower endoscopies  Clinical Impression  Patient did well with ambulation.  Patient did require hand held assistance, but more for security than physical need.  Do not feel patient needs further PT.  Did encourage patient to ambulate with nursing/family and patient will have assistance at home.  Feel patient will be safe to discharge home with family support when medically ready.  Note OT ordered for patient - do not feel OT is indicated and discussed with patient and she agrees.  Will let OT know.     Follow Up Recommendations No PT follow up    Equipment Recommendations  None recommended by PT    Recommendations for Other Services       Precautions / Restrictions Precautions Precautions: None      Mobility  Bed Mobility Overal bed mobility: Independent                Transfers Overall transfer level: Modified independent Equipment used: None             General transfer comment: used railing  Ambulation/Gait Ambulation/Gait assistance: Min assist Ambulation Distance (Feet): 150 Feet Assistive device: 1 person hand held assist Gait  Pattern/deviations: WFL(Within Functional Limits) Gait velocity: slightly decreased   General Gait Details: hand held assist was more "holding hands' than physical support.  Patient with no loss of balance. Ambulated at steady pace.  Stairs            Wheelchair Mobility    Modified Rankin (Stroke Patients Only)       Balance Overall balance assessment: Needs assistance Sitting-balance support: No upper extremity supported Sitting balance-Leahy Scale: Normal     Standing balance support: No upper extremity supported Standing balance-Leahy Scale: Fair                               Pertinent Vitals/Pain Pain Assessment: No/denies pain    Home Living Family/patient expects to be discharged to:: Private residence Living Arrangements: Spouse/significant other Available Help at Discharge: Family;Available 24 hours/day Type of Home: House Home Access: Stairs to enter Entrance Stairs-Rails: None Entrance Stairs-Number of Steps: 3 Home Layout: One level Home Equipment: Walker - 2 wheels;Cane - single point      Prior Function Level of Independence: Independent               Hand Dominance        Extremity/Trunk Assessment   Upper Extremity Assessment: Generalized weakness           Lower Extremity Assessment: Generalized weakness      Cervical / Trunk Assessment: Normal  Communication   Communication: No difficulties  Cognition Arousal/Alertness: Awake/alert Behavior During Therapy: WFL for  tasks assessed/performed Overall Cognitive Status: Within Functional Limits for tasks assessed                      General Comments      Exercises        Assessment/Plan    PT Assessment Patent does not need any further PT services  PT Diagnosis Generalized weakness   PT Problem List    PT Treatment Interventions     PT Goals (Current goals can be found in the Care Plan section) Acute Rehab PT Goals Patient Stated Goal: go  home today PT Goal Formulation: With patient    Frequency     Barriers to discharge        Co-evaluation               End of Session   Activity Tolerance: Patient tolerated treatment well;No increased pain Patient left: in bed;with call bell/phone within reach           Time: 1030-1039 PT Time Calculation (min) (ACUTE ONLY): 9 min   Charges:   PT Evaluation $PT Eval Low Complexity: 1 Procedure     PT G CodesShanna Cisco 10/11/2015, 10:50 AM  10/11/2015 [place], [place] [phone]

## 2015-10-11 NOTE — Progress Notes (Signed)
OT Cancellation Note  Patient Details Name: Connie [name] MRN: [id number] DOB: 09-07-1934   Cancelled Treatment:    Reason Eval/Treat Not Completed: OT screened, no needs identified, will sign off  Benito Mccreedy OTR/L [id number] 10/11/2015, 1:39 PM

## 2015-10-11 NOTE — Progress Notes (Signed)
[facility] PROGRESS NOTE  ROTHA LAUR [id number] DOB: 08-May-1934 DOA: 10/10/2015 PCP: Wyatt Haste, MD  Assessment/Plan: Principal Problem:   Acute renal failure (ARF) ([place]) - Most likely secondary to dehydration and prerenal causes. We'll reassess serum creatinine next a.m. with continued fluid rehydration  Active Problems:   Hypertension associated with diabetes ([place]) - We'll hold nephrotoxic agents and continue to hold Lasix and ACE inhibitor.    Hypothyroidism - Stable continue Synthroid    Hyperlipidemia associated with type 2 diabetes mellitus ([name]) - Statin currently on hold, no chest pain reported    Type 2 diabetes with complication ([name]) - Continue sliding scale insulin    Angiodysplasia of stomach and duodenum with hemorrhage/Gastric ulcer with hemorrhage - Patient with history of this will continue to monitor hemoglobin levels. Suspect recent decrease in hemoglobin related to IV fluid rehydration and hemodilution.   Code Status: Full Family Communication: Discussed with patient and family member Disposition Plan: Pending improvement in serum creatinine   Consultants:  GI consulted reportedly by ED personnel although this is not certain. Will have nursing reach out to GI  Procedures:  None  Antibiotics: None HPI/Subjective: Pt has no new complaints no acute issues overnight.  Objective: Filed Vitals:   10/11/15 0441 10/11/15 0700  BP: 103/26 130/34  Pulse: 66 70  Temp: 98.6 F (37 C) 98.7 F (37.1 C)  Resp: 18 18    Intake/Output Summary (Last 24 hours) at 10/11/15 1526 Last data filed at 10/11/15 1417  Gross per 24 hour  Intake 1898.33 ml  Output    900 ml  Net 998.33 ml   Filed Weights   10/10/15 1623 10/11/15 0027  Weight: 48.988 kg (108 lb) 48.988 kg (108 lb)    Exam:   General:  Pt in nad, alert and awake  Cardiovascular: rrr, no mrg  Respiratory: cta bl, no wheezes  Abdomen: soft, nd, nt, no  rebound tenderness  Musculoskeletal: no cyanosis or clubbing   Data Reviewed: Basic Metabolic Panel:  Recent Labs Lab 10/10/15 1623 10/11/15 0630  NA 133* 137  K 3.4* 3.1*  CL 90* 99*  CO2 19* 20*  GLUCOSE 125* 66  BUN 68* 58*  CREATININE 5.67* 4.00*  CALCIUM 8.8* 8.1*   Liver Function Tests:  Recent Labs Lab 10/10/15 1623 10/11/15 0630  AST 27 18  ALT 7* 6*  ALKPHOS 91 74  BILITOT 1.1 0.7  PROT 8.1 6.4*  ALBUMIN 3.5 2.9*    Recent Labs Lab 10/10/15 1623  LIPASE 27   No results for input(s): AMMONIA in the last 168 hours. CBC:  Recent Labs Lab 10/10/15 1623 10/11/15 0630  WBC 21.5* 15.2*  HGB 11.1* 10.3*  HCT 34.7* 32.3*  MCV 78.9 79.4  PLT 667* 558*   Cardiac Enzymes:  Recent Labs Lab 10/10/15 2043 10/11/15 0105 10/11/15 0630 10/11/15 1257  TROPONINI 0.08* 0.05* 0.12* 0.08*   BNP (last 3 results) No results for input(s): BNP in the last 8760 hours.  ProBNP (last 3 results) No results for input(s): PROBNP in the last 8760 hours.  CBG:  Recent Labs Lab 10/10/15 2233 10/11/15 0752 10/11/15 0818 10/11/15 1202  GLUCAP 85 65 76 113*    No results found for this or any previous visit (from the past 240 hour(s)).   Studies: US Abdomen Complete  10/10/2015  CLINICAL DATA:  Epigastric abdominal pain. EXAM: ABDOMEN ULTRASOUND COMPLETE COMPARISON:  CT of the abdomen and pelvis 10/07/2015 FINDINGS: Gallbladder: There several layering gallstones the largest of  which measures 9 mm. Gallbladder wall measures 1.7 mm in thickness. No secondary signs of acute cholecystitis. Sonographic Murphy's sign was negative. Common bile duct: Diameter: 4 mm. Liver: No focal lesion identified. Within normal limits in parenchymal echogenicity. IVC: No abnormality visualized. Pancreas: The pancreas is atrophic. Main pancreatic duct is irregular and dilated measuring 4 mm in cross-section. Spleen: Size and appearance within normal limits. Right Kidney: Length: 10.5  cm. No mass or hydronephrosis visualized. There are several right renal cysts, the largest in the lower pole measures 2.9 cm. Left Kidney: Length: 9.4 cm. Echogenicity within normal limits. No mass or hydronephrosis visualized. Abdominal aorta: No aneurysm visualized. Atherosclerotic calcifications are noted. Other findings: None. IMPRESSION: Atrophic pancreas with irregularly dilated main pancreatic duct, measuring up to 4 mm. Cholelithiasis without evidence of cholecystitis. Atherosclerotic disease of the aorta. Right renal cysts. Electronically Signed   By: Fidela Salisbury M.D.   On: 10/10/2015 22:17   Dg Abd Acute W/chest  10/10/2015  CLINICAL DATA:  Nausea vomiting anorexia abdominal pain, GI bleeding, 13 pound weight loss in the past month EXAM: DG ABDOMEN ACUTE W/ 1V CHEST COMPARISON:  None. FINDINGS: The heart size and vascular pattern are normal. The lungs are clear. There is no free air. Significant scoliosis of the lumbar spine convex left. No abnormally dilated loops of bowel. Nonobstructive gas pattern. IMPRESSION: Nonobstructive bowel gas pattern. No acute cardiopulmonary abnormality. Electronically Signed   By: Skipper Cliche M.D.   On: 10/10/2015 21:41    Scheduled Meds: . feeding supplement  1 Container Oral BID BM  . insulin aspart  0-9 Units Subcutaneous TID WC  . levothyroxine  125 mcg Oral QAC breakfast  . multivitamin with minerals  1 tablet Oral Daily  . sodium chloride  3 mL Intravenous Q12H   Continuous Infusions: . sodium chloride 100 mL/hr at 10/11/15 1124    Time spent: >35 minutes  Velvet Bathe  [facility] Pager [phone] If 7PM-7AM, please contact night-coverage at [email or web address], password [facility] 10/11/2015, 3:26 PM  LOS: 1 day

## 2015-10-12 LAB — BASIC METABOLIC PANEL
ANION GAP: 9 (ref 5–15)
BUN: 26 mg/dL — ABNORMAL HIGH (ref 6–20)
CALCIUM: 8.2 mg/dL — AB (ref 8.9–10.3)
CO2: 26 mmol/L (ref 22–32)
Chloride: 105 mmol/L (ref 101–111)
Creatinine, Ser: 1.74 mg/dL — ABNORMAL HIGH (ref 0.44–1.00)
GFR, EST AFRICAN AMERICAN: 31 mL/min — AB (ref >60)
GFR, EST NON AFRICAN AMERICAN: 27 mL/min — AB (ref >60)
GLUCOSE: 117 mg/dL — AB (ref 65–99)
Potassium: 3 mmol/L — ABNORMAL LOW (ref 3.5–5.1)
Sodium: 140 mmol/L (ref 135–145)

## 2015-10-12 LAB — GLUCOSE, CAPILLARY
Glucose-Capillary: 122 mg/dL — ABNORMAL HIGH (ref 65–99)
Glucose-Capillary: 104 mg/dL — ABNORMAL HIGH (ref 65–99)

## 2015-10-12 MED ORDER — FUROSEMIDE 20 MG PO TABS: 20.0000 mg | ORAL_TABLET | ORAL | Status: DC

## 2015-10-12 MED ORDER — POTASSIUM CHLORIDE CRYS ER 20 MEQ PO TBCR
40.0000 meq | EXTENDED_RELEASE_TABLET | Freq: Once | ORAL | Status: AC
  Administered 2015-10-12: 40 meq via ORAL
  Filled 2015-10-12: qty 2

## 2015-10-12 NOTE — Discharge Summary (Signed)
Physician Discharge Summary  Connie [name] [id number] DOB: [birth year] DOA: 10/10/2015  PCP: Wyatt Haste, MD  Admit date: 10/10/2015 Discharge date: 10/12/2015  Time spent: > 35 minutes  Recommendations for Outpatient Follow-up:  1. Monitor Serum creatinine 2. Decide when to resume patient's blood pressure medications if able 3. Metformin discontinued due to elevated serum creatinine   Discharge Diagnoses:  Principal Problem:   Acute renal failure (ARF) ([name]) Active Problems:   Hypertension associated with diabetes ([place])   Hypothyroidism   Hyperlipidemia associated with type 2 diabetes mellitus ([place])   Type 2 diabetes with complication ([name])   Angiodysplasia of stomach and duodenum with hemorrhage   Gastric ulcer with hemorrhage   Iron deficiency anemia secondary to blood loss (chronic)   Metabolic acidosis   Elevated troponin   Discharge Condition: stable  Diet recommendation: diabetic diet  Filed Weights   10/10/15 1623 10/11/15 0027  Weight: 48.988 kg (108 lb) 48.988 kg (108 lb)    History of present illness:  From original HPI: 82 y.o. female with a past medical history of diabetes, hypertension, hypothyroidism, hypercholesterolemia, who has had abdominal pain, nausea and vomiting ongoing for the past many weeks. She has a history of iron deficiency anemia. She has been found to have angiodysplasias in her GI tract, which is thought to be the reason for her anemia. Pt presented with AKI and dehydration  Hospital Course:  Acute renal failure (ARF) ([name]) - improved with cessation of nephrotoxic agents. Will discontinue ACE inhibitor on discharge is patient's blood pressures have been within normal limits off her antihypertensives. Will also hold Lasix for several days and continue in roughly for 5 days. Patient is to continue her potassium replacement at that time. - Metformin held secondary to elevated serum creatinine   Active Problems:   Hypertension associated with diabetes ([place]) - We'll hold antihypertensives as patient has had pressures which have been within normal limits off of them   Hypothyroidism - Stable continue Synthroid   Hyperlipidemia associated with type 2 diabetes mellitus ([name]) - Statin currently on hold, no chest pain reported   Type 2 diabetes with complication ([name]) - Continue sliding scale insulin   Angiodysplasia of stomach and duodenum with hemorrhage/Gastric ulcer with hemorrhage - Patient with history of this will continue to monitor hemoglobin levels. Suspect recent decrease in hemoglobin related to IV fluid rehydration and hemodilution. - No reported Melena or BRBPR  Procedures:  None  Consultations:  None  Discharge Exam: Filed Vitals:   10/11/15 2108 10/12/15 0457  BP: 132/37 128/42  Pulse: 74 70  Temp: 99.7 F (37.6 C) 99.9 F (37.7 C)  Resp: 18 16    General: Pt in nad, alert and awake Cardiovascular: rrr, no mrg Respiratory: cta bl, no wheezes  Discharge Instructions   Discharge Instructions    Call MD for:  difficulty breathing, headache or visual disturbances    Complete by:  As directed      Call MD for:  severe uncontrolled pain    Complete by:  As directed      Call MD for:  temperature >100.4    Complete by:  As directed      Diet - low sodium heart healthy    Complete by:  As directed      Discharge instructions    Complete by:  As directed   Please be sure to follow up with your GI specialist after discharge. Your lasix you should hold for a few days until you  are better hydrated. When you start taking your lasix start taking potassium replacement. Please follow up with your primary care physician to decide when to continue some of your blood pressure medications.     Increase activity slowly    Complete by:  As directed           Current Discharge Medication List    CONTINUE these medications which have CHANGED   Details  furosemide (LASIX)  20 MG tablet Take 1 tablet (20 mg total) by mouth every morning. Qty: 90 tablet, Refills: 0      CONTINUE these medications which have NOT CHANGED   Details  aspirin 81 MG tablet Take 81 mg by mouth daily.      atorvastatin (LIPITOR) 40 MG tablet TAKE ONE TABLET BY MOUTH ONCE DAILY Qty: 90 tablet, Refills: 3   Associated Diagnoses: Hyperlipidemia associated with type 2 diabetes mellitus ([name])    ferrous sulfate 325 (65 FE) MG tablet Take 1 tablet (325 mg total) by mouth daily with breakfast. Qty: 30 tablet, Refills: 11    levothyroxine (SYNTHROID, LEVOTHROID) 125 MCG tablet TAKE ONE TABLET BY MOUTH EVERY DAY Qty: 90 tablet, Refills: 3    pioglitazone (ACTOS) 30 MG tablet Take 1 tablet (30 mg total) by mouth daily. Qty: 90 tablet, Refills: 1    potassium chloride (KLOR-CON M10) 10 MEQ tablet TAKE ONE TABLET BY MOUTH TWICE DAILY Resume in 2-3 days when you start lasix      STOP taking these medications     amLODipine (NORVASC) 10 MG tablet      benazepril-hydrochlorthiazide (LOTENSIN HCT) 20-25 MG per tablet      cloNIDine (CATAPRES) 0.1 MG tablet      metFORMIN (GLUCOPHAGE) 850 MG tablet      promethazine (PHENERGAN) 25 MG tablet        No Known Allergies    [name] (including imaging, microbiology, ancillary and laboratory) are listed below for reference.    Significant Diagnostic Studies: US Abdomen Complete  10/10/2015  CLINICAL DATA:  Epigastric abdominal pain. EXAM: ABDOMEN ULTRASOUND COMPLETE COMPARISON:  CT of the abdomen and pelvis 10/07/2015 FINDINGS: Gallbladder: There several layering gallstones the largest of which measures 9 mm. Gallbladder wall measures 1.7 mm in thickness. No secondary signs of acute cholecystitis. Sonographic Murphy's sign was negative. Common bile duct: Diameter: 4 mm. Liver: No focal lesion identified. Within normal limits in parenchymal echogenicity. IVC: No abnormality  visualized. Pancreas: The pancreas is atrophic. Main pancreatic duct is irregular and dilated measuring 4 mm in cross-section. Spleen: Size and appearance within normal limits. Right Kidney: Length: 10.5 cm. No mass or hydronephrosis visualized. There are several right renal cysts, the largest in the lower pole measures 2.9 cm. Left Kidney: Length: 9.4 cm. Echogenicity within normal limits. No mass or hydronephrosis visualized. Abdominal aorta: No aneurysm visualized. Atherosclerotic calcifications are noted. Other findings: None. IMPRESSION: Atrophic pancreas with irregularly dilated main pancreatic duct, measuring up to 4 mm. Cholelithiasis without evidence of cholecystitis. Atherosclerotic disease of the aorta. Right renal cysts. Electronically Signed   By: Fidela Salisbury M.D.   On: 10/10/2015 22:17   Ct Abdomen Pelvis W Contrast  10/07/2015  CLINICAL DATA:  Periumbilical pain with nausea for few months. Weight loss anemia. EXAM: CT ABDOMEN AND PELVIS WITH CONTRAST TECHNIQUE: Multidetector CT imaging of the abdomen and pelvis was performed using the standard protocol following bolus administration of intravenous contrast. CONTRAST:  80mL ISOVUE-300 IOPAMIDOL (ISOVUE-300) INJECTION 61%  COMPARISON:  Renal ultrasound 02/20/2015. FINDINGS: Lower chest: 3 mm right lower lobe pulmonary nodule on image 8/series 5. A lingular nodule measures 5 mm on image 7/series 5. Mild cardiomegaly, without pericardial or pleural effusion. Hepatobiliary: Minimal motion degradation within the abdomen. Normal liver. Cholelithiasis, 9 mm stone. No surrounding inflammation or biliary duct dilatation. Pancreas: Pancreatic atrophy. Suspicion of areas of main and side branch duct ectasia, including within the body on image 22/series 2 and the head on image 24/2. No surrounding inflammation. Spleen: Normal in size, without focal abnormality. Adrenals/Urinary Tract: Mild bilateral adrenal thickening. Lower pole right renal cyst of 2.6  cm. Smaller left renal lesions are also likely cysts. Bilateral too small to characterize renal lesions. No hydronephrosis. Decompressed bladder. Stomach/Bowel: Underdistention of the gastric body. Colonic diverticulosis with probable muscular hypertrophy in sigmoid. Normal terminal ileum and appendix. Normal small bowel. Vascular/Lymphatic: Advanced aortic and branch vessel atherosclerosis. No abdominopelvic adenopathy. Reproductive: Normal uterus and adnexa. Other: No significant free fluid. Musculoskeletal: Remote trauma to the right ischium. Convex left lumbar spine curvature. Advanced lumbosacral spondylosis. IMPRESSION: 1.  No acute process in the abdomen or pelvis. 2. Minimal motion degradation. 3. Suspicion of main and/or side branch duct ectasia within the pancreas. This could represent the sequelae of pancreatitis. One or more cystic pancreatic neoplasms could look similar. Suboptimally evaluated secondary to nondedicated CT technique and minimal motion. If the patient is an MRI candidate, pancreatic protocol MRI with MRCP (without with contrast) suggested. Alternatively, and less favored, pancreatic protocol dedicated CT may be informative. 4. Cholelithiasis. 5. Bibasilar lung nodules. If the patient is at high risk for bronchogenic carcinoma, follow-up chest CT at 6-12 months is recommended. If the patient is at low risk for bronchogenic carcinoma, follow-up chest CT at 12 months is recommended. This recommendation follows the consensus statement: "Guidelines for Management of Small Pulmonary Nodules Detected on CT Scans: A Statement from the Earth" as published in Radiology2005;237:395-400. Online at: https://www.arnold.com/. Electronically Signed   By: Abigail Miyamoto M.D.   On: 10/07/2015 09:25   Dg Abd Acute W/chest  10/10/2015  CLINICAL DATA:  Nausea vomiting anorexia abdominal pain, GI bleeding, 13 pound weight loss in the past month EXAM: DG ABDOMEN  ACUTE W/ 1V CHEST COMPARISON:  None. FINDINGS: The heart size and vascular pattern are normal. The lungs are clear. There is no free air. Significant scoliosis of the lumbar spine convex left. No abnormally dilated loops of bowel. Nonobstructive gas pattern. IMPRESSION: Nonobstructive bowel gas pattern. No acute cardiopulmonary abnormality. Electronically Signed   By: Skipper Cliche M.D.   On: 10/10/2015 21:41    Microbiology: No results found for this or any previous visit (from the past 240 hour(s)).   Labs: Basic Metabolic Panel:  Recent Labs Lab 10/10/15 1623 10/11/15 0630 10/12/15 0804  NA 133* 137 140  K 3.4* 3.1* 3.0*  CL 90* 99* 105  CO2 19* 20* 26  GLUCOSE 125* 66 117*  BUN 68* 58* 26*  CREATININE 5.67* 4.00* 1.74*  CALCIUM 8.8* 8.1* 8.2*   Liver Function Tests:  Recent Labs Lab 10/10/15 1623 10/11/15 0630  AST 27 18  ALT 7* 6*  ALKPHOS 91 74  BILITOT 1.1 0.7  PROT 8.1 6.4*  ALBUMIN 3.5 2.9*    Recent Labs Lab 10/10/15 1623  LIPASE 27   No results for input(s): AMMONIA in the last 168 hours. CBC:  Recent Labs Lab 10/10/15 1623 10/11/15 0630  WBC 21.5* 15.2*  HGB 11.1* 10.3*  HCT 34.7*  32.3*  MCV 78.9 79.4  PLT 667* 558*   Cardiac Enzymes:  Recent Labs Lab 10/10/15 2043 10/11/15 0105 10/11/15 0630 10/11/15 1257  TROPONINI 0.08* 0.05* 0.12* 0.08*   BNP: BNP (last 3 results) No results for input(s): BNP in the last 8760 hours.  ProBNP (last 3 results) No results for input(s): PROBNP in the last 8760 hours.  CBG:  Recent Labs Lab 10/11/15 1202 10/11/15 1606 10/11/15 2107 10/12/15 0753 10/12/15 1205  GLUCAP 113* 157* 85 122* 104*    Signed:  Velvet Bathe MD.  [facility] 10/12/2015, 1:04 PM

## 2015-10-12 NOTE — Progress Notes (Signed)
Connie [name] to be D/C'd Home per MD order.  Discussed prescriptions and follow up appointments with the patient. Prescriptions given to patient, medication list explained in detail. Pt verbalized understanding.    Medication List    STOP taking these medications        amLODipine 10 MG tablet  Commonly known as:  NORVASC     benazepril-hydrochlorthiazide 20-25 MG tablet  Commonly known as:  LOTENSIN HCT     cloNIDine 0.1 MG tablet  Commonly known as:  CATAPRES     metFORMIN 850 MG tablet  Commonly known as:  GLUCOPHAGE     promethazine 25 MG tablet  Commonly known as:  PHENERGAN      TAKE these medications        aspirin 81 MG tablet  Take 81 mg by mouth daily.     atorvastatin 40 MG tablet  Commonly known as:  LIPITOR  TAKE ONE TABLET BY MOUTH ONCE DAILY     ferrous sulfate 325 (65 FE) MG tablet  Take 1 tablet (325 mg total) by mouth daily with breakfast.     furosemide 20 MG tablet  Commonly known as:  LASIX  Take 1 tablet (20 mg total) by mouth every morning.  Start taking on:  10/16/2015     levothyroxine 125 MCG tablet  Commonly known as:  SYNTHROID, LEVOTHROID  TAKE ONE TABLET BY MOUTH EVERY DAY     pioglitazone 30 MG tablet  Commonly known as:  ACTOS  Take 1 tablet (30 mg total) by mouth daily.     potassium chloride 10 MEQ tablet  Commonly known as:  KLOR-CON M10  TAKE ONE TABLET BY MOUTH TWICE DAILY Resume in 2-3 days when you start lasix        Filed Vitals:   10/11/15 2108 10/12/15 0457  BP: 132/37 128/42  Pulse: 74 70  Temp: 99.7 F (37.6 C) 99.9 F (37.7 C)  Resp: 18 16    Skin clean, dry and intact without evidence of skin break down, no evidence of skin tears noted. IV catheter discontinued intact. Site without signs and symptoms of complications. Dressing and pressure applied. Pt denies pain at this time. No complaints noted.  An After Visit Summary was printed and given to the patient. Patient escorted via [place], and D/C home  via private auto.  Carole Civil RN [facility] Phone [phone]

## 2015-10-13 ENCOUNTER — Telehealth: Payer: Self-pay | Admitting: Family Medicine

## 2015-10-13 LAB — HEMOGLOBIN A1C
Hgb A1c MFr Bld: 6.5 % — ABNORMAL HIGH (ref 4.8–5.6)
MEAN PLASMA GLUCOSE: 140 mg/dL

## 2015-10-13 NOTE — Telephone Encounter (Signed)
Called pt to discuss her recent hospitalization. Pt states she is doing a lot better. Pt had a good understanding of medication changes that were made in the hospital. She was to stop Norvasc, Lotensin HCT, Clonidine, Metformin and Phenergan. She was to stop until the 26th on Potassium chloride and lasix. The rest of her meds stayed the same. Pt did have medications at home and has her husband there to help her. Pt made an appt for this Wednesday the 25th for hospital follow up. Pt was told to bring all medications with her and pt verbalized understanding. Pt was also informed about the after hours phone protocol and told that if she needed anything to call the office. Pt verbalized understanding.

## 2015-10-15 ENCOUNTER — Ambulatory Visit (INDEPENDENT_AMBULATORY_CARE_PROVIDER_SITE_OTHER): Payer: Commercial Managed Care - HMO | Admitting: Family Medicine

## 2015-10-15 ENCOUNTER — Encounter: Payer: Self-pay | Admitting: Family Medicine

## 2015-10-15 VITALS — BP 150/90 | HR 73 | Wt 110.0 lb

## 2015-10-15 DIAGNOSIS — K5520 Angiodysplasia of colon without hemorrhage: Secondary | ICD-10-CM

## 2015-10-15 DIAGNOSIS — D500 Iron deficiency anemia secondary to blood loss (chronic): Secondary | ICD-10-CM

## 2015-10-15 DIAGNOSIS — Q2733 Arteriovenous malformation of digestive system vessel: Secondary | ICD-10-CM | POA: Diagnosis not present

## 2015-10-15 DIAGNOSIS — D692 Other nonthrombocytopenic purpura: Secondary | ICD-10-CM

## 2015-10-15 DIAGNOSIS — N289 Disorder of kidney and ureter, unspecified: Secondary | ICD-10-CM | POA: Diagnosis not present

## 2015-10-15 LAB — COMPREHENSIVE METABOLIC PANEL
ALT: 5 U/L — AB (ref 6–29)
AST: 15 U/L (ref 10–35)
Albumin: 2.9 g/dL — ABNORMAL LOW (ref 3.6–5.1)
Alkaline Phosphatase: 72 U/L (ref 33–130)
BUN: 9 mg/dL (ref 7–25)
CHLORIDE: 105 mmol/L (ref 98–110)
CO2: 24 mmol/L (ref 20–31)
CREATININE: 0.94 mg/dL — AB (ref 0.60–0.93)
Calcium: 8.1 mg/dL — ABNORMAL LOW (ref 8.6–10.4)
GLUCOSE: 94 mg/dL (ref 65–99)
Potassium: 3.8 mmol/L (ref 3.5–5.3)
SODIUM: 139 mmol/L (ref 135–146)
TOTAL PROTEIN: 5.8 g/dL — AB (ref 6.1–8.1)
Total Bilirubin: 0.6 mg/dL (ref 0.2–1.2)

## 2015-10-15 NOTE — Progress Notes (Signed)
   Subjective:    Patient ID: Connie [name], female    DOB: Aug 18, 1934, 82 y.o.   MRN: [id number]  HPI she is here for follow-up visit after recent hospitalization and treated for acute renal failure secondary to vomiting. Her medical record including discharge summary was reviewed. Presently she is doing much better. She has a history of anemia due to an AVM in the colon. She is taking slow FE. She does state her BMs are normal. She is now starting to eat but mainly a bland diet and plans to continue to expand her diet. Overall she seems to be doing quite well. She is now almost 82 years old.   Review of Systems     Objective:   Physical Exam Alert and in no distress. Blood pressure and weight are recorded. Exam of her arms does show purpuric lesions.       Assessment & Plan:  Iron deficiency anemia due to chronic blood loss  Senile purpura ([name])  Impaired renal function - Plan: Comprehensive metabolic panel  AVM (arteriovenous malformation) of colon She will continue to advance her diet as tolerated. Explained that there is nothing can be done for the purpura. Follow-up concerning her renal functions. She will continue on her iron and we will follow-up on this later date. She is scheduled to be valuated further by GI for her AVM and continued difficulty with this. Over 25 minutes spent discussing all these issues with her.

## 2015-10-15 NOTE — Patient Instructions (Addendum)
Stay on Slow Fe  Slowly add back whatever food you want

## 2015-10-23 ENCOUNTER — Ambulatory Visit (HOSPITAL_COMMUNITY)
Admission: RE | Admit: 2015-10-23 | Discharge: 2015-10-23 | Disposition: A | Discharge status: Home / Self Care | Payer: Commercial Managed Care - HMO | Source: Ambulatory Visit | Attending: Gastroenterology | Admitting: Gastroenterology

## 2015-10-23 DIAGNOSIS — R109 Unspecified abdominal pain: Secondary | ICD-10-CM | POA: Insufficient documentation

## 2015-10-23 DIAGNOSIS — R140 Abdominal distension (gaseous): Secondary | ICD-10-CM | POA: Diagnosis not present

## 2015-10-23 DIAGNOSIS — R112 Nausea with vomiting, unspecified: Secondary | ICD-10-CM | POA: Diagnosis not present

## 2015-10-23 DIAGNOSIS — R6881 Early satiety: Secondary | ICD-10-CM | POA: Insufficient documentation

## 2015-10-23 DIAGNOSIS — R110 Nausea: Secondary | ICD-10-CM

## 2015-10-23 MED ORDER — TECHNETIUM TC 99M SULFUR COLLOID
2.0000 | Freq: Once | INTRAVENOUS | Status: AC | PRN
  Administered 2015-10-23: 2 via ORAL

## 2015-11-05 ENCOUNTER — Other Ambulatory Visit: Payer: Self-pay | Admitting: Family Medicine

## 2015-11-20 ENCOUNTER — Ambulatory Visit (INDEPENDENT_AMBULATORY_CARE_PROVIDER_SITE_OTHER): Payer: Commercial Managed Care - HMO | Admitting: Family Medicine

## 2015-11-20 VITALS — BP 124/68 | HR 64 | Wt 121.6 lb

## 2015-11-20 DIAGNOSIS — D649 Anemia, unspecified: Secondary | ICD-10-CM | POA: Diagnosis not present

## 2015-11-20 DIAGNOSIS — R609 Edema, unspecified: Secondary | ICD-10-CM

## 2015-11-20 DIAGNOSIS — G479 Sleep disorder, unspecified: Secondary | ICD-10-CM

## 2015-11-20 DIAGNOSIS — K31811 Angiodysplasia of stomach and duodenum with bleeding: Secondary | ICD-10-CM

## 2015-11-20 DIAGNOSIS — D500 Iron deficiency anemia secondary to blood loss (chronic): Secondary | ICD-10-CM

## 2015-11-20 LAB — CBC WITH DIFFERENTIAL/PLATELET
BASOS PCT: 1 % (ref 0–1)
Basophils Absolute: 0.1 10*3/uL (ref 0.0–0.1)
EOS ABS: 0.3 10*3/uL (ref 0.0–0.7)
EOS PCT: 4 % (ref 0–5)
HCT: 17.8 % — ABNORMAL LOW (ref 36.0–46.0)
Hemoglobin: 5.4 g/dL — CL (ref 12.0–15.0)
LYMPHS ABS: 1 10*3/uL (ref 0.7–4.0)
LYMPHS PCT: 15 % (ref 12–46)
MCH: 23.9 pg — AB (ref 26.0–34.0)
MCHC: 30.3 g/dL (ref 30.0–36.0)
MCV: 78.8 fL (ref 78.0–100.0)
MONOS PCT: 12 % (ref 3–12)
MPV: 9.4 fL (ref 8.6–12.4)
Monocytes Absolute: 0.8 10*3/uL (ref 0.1–1.0)
NEUTROS ABS: 4.4 10*3/uL (ref 1.7–7.7)
NEUTROS PCT: 68 % (ref 43–77)
PLATELETS: 478 10*3/uL — AB (ref 150–400)
RBC: 2.26 MIL/uL — ABNORMAL LOW (ref 3.87–5.11)
RDW: 15.2 % (ref 11.5–15.5)
WBC: 6.4 10*3/uL (ref 4.0–10.5)

## 2015-11-20 LAB — COMPREHENSIVE METABOLIC PANEL
ALT: 4 U/L — ABNORMAL LOW (ref 6–29)
AST: 16 U/L (ref 10–35)
Albumin: 2.9 g/dL — ABNORMAL LOW (ref 3.6–5.1)
Alkaline Phosphatase: 57 U/L (ref 33–130)
BUN: 12 mg/dL (ref 7–25)
CHLORIDE: 103 mmol/L (ref 98–110)
CO2: 27 mmol/L (ref 20–31)
Calcium: 7.7 mg/dL — ABNORMAL LOW (ref 8.6–10.4)
Creat: 0.96 mg/dL — ABNORMAL HIGH (ref 0.60–0.88)
GLUCOSE: 105 mg/dL — AB (ref 65–99)
POTASSIUM: 3.2 mmol/L — AB (ref 3.5–5.3)
SODIUM: 142 mmol/L (ref 135–146)
Total Bilirubin: 0.4 mg/dL (ref 0.2–1.2)
Total Protein: 5.5 g/dL — ABNORMAL LOW (ref 6.1–8.1)

## 2015-11-20 MED ORDER — TRAZODONE HCL 50 MG PO TABS: ORAL_TABLET | ORAL | Status: DC

## 2015-11-20 NOTE — Patient Instructions (Addendum)
With some support stockings only get out of bed in the morning. When you sit keep her legs elevated. Start a walking program Use the sleep medicine for 3 or 4 days to break the cycle and then as needed after that

## 2015-11-20 NOTE — Progress Notes (Signed)
   Subjective:    Patient ID: Connie [name], female    DOB: [birth year], 82 y.o.   MRN: [id number]  HPI Here mainly to discuss leg edema that she has noted over the last several days. She states that she has started doing more yard work standing and raking. Prior to this over the previous months she has had difficulty with anemia and anorexia and did get an extensive workup on that. It did show evidence of angiodysplasia of the stomach and duodenum with some hemorrhage. Last month she has started to gain weight back but has been very inactive. She has had no chest pain, PND, orthopnea  Her last blood work was roughly a month ago. She also complains of difficulty with sleep over the last 3 weeks. She takes no naps, uses no decongestants, has one cup of coffee in the morning.   Review of Systems     Objective:   Physical Exam Alert and in no distress. Cardiac exam shows a 1/6 SEM. Lungs clear to auscultation. Lower extremity exam does show 2 to 3+ pitting edema. Negative Homan's sign.       Assessment & Plan:  Dependent edema - Plan: traZODone (DESYREL) 50 MG tablet  Iron deficiency anemia secondary to blood loss (chronic) - Plan: CBC with Differential/Platelet, Comprehensive metabolic panel  Symptomatic anemia - Plan: CBC with Differential/Platelet, Comprehensive metabolic panel  Sleep disturbance  Angiodysplasia of stomach and duodenum with hemorrhage I will get routine blood screening to evaluate the anemia. Her weight is up which I congratulated her on. I explained that the edema is probably as much deconditioning and the fact that she has been standing more than normal and the last several days. Suggested using support hose and starting a walking program for deconditioning and also to help with her edema. When she is sitting she is to elevate her legs. I will give her a small amount of trazodone to help with sleep. She is not interested in long-term use and I suspect that the  sleep issue is also related to her lifestyle.

## 2015-11-21 ENCOUNTER — Observation Stay (HOSPITAL_COMMUNITY)
Admission: EM | Admit: 2015-11-21 | Discharge: 2015-11-22 | Disposition: A | Discharge status: Home / Self Care | Payer: Commercial Managed Care - HMO | Attending: Internal Medicine | Admitting: Internal Medicine

## 2015-11-21 ENCOUNTER — Encounter (HOSPITAL_COMMUNITY): Payer: Self-pay

## 2015-11-21 DIAGNOSIS — E119 Type 2 diabetes mellitus without complications: Secondary | ICD-10-CM | POA: Diagnosis not present

## 2015-11-21 DIAGNOSIS — M1990 Unspecified osteoarthritis, unspecified site: Secondary | ICD-10-CM | POA: Diagnosis not present

## 2015-11-21 DIAGNOSIS — Z79899 Other long term (current) drug therapy: Secondary | ICD-10-CM | POA: Diagnosis not present

## 2015-11-21 DIAGNOSIS — Z7984 Long term (current) use of oral hypoglycemic drugs: Secondary | ICD-10-CM | POA: Diagnosis not present

## 2015-11-21 DIAGNOSIS — D500 Iron deficiency anemia secondary to blood loss (chronic): Secondary | ICD-10-CM | POA: Diagnosis not present

## 2015-11-21 DIAGNOSIS — Z8774 Personal history of (corrected) congenital malformations of heart and circulatory system: Secondary | ICD-10-CM | POA: Insufficient documentation

## 2015-11-21 DIAGNOSIS — Z7982 Long term (current) use of aspirin: Secondary | ICD-10-CM | POA: Diagnosis not present

## 2015-11-21 DIAGNOSIS — E669 Obesity, unspecified: Secondary | ICD-10-CM | POA: Insufficient documentation

## 2015-11-21 DIAGNOSIS — D62 Acute posthemorrhagic anemia: Principal | ICD-10-CM | POA: Diagnosis present

## 2015-11-21 DIAGNOSIS — K922 Gastrointestinal hemorrhage, unspecified: Secondary | ICD-10-CM | POA: Diagnosis not present

## 2015-11-21 DIAGNOSIS — D509 Iron deficiency anemia, unspecified: Secondary | ICD-10-CM | POA: Diagnosis not present

## 2015-11-21 DIAGNOSIS — E039 Hypothyroidism, unspecified: Secondary | ICD-10-CM

## 2015-11-21 DIAGNOSIS — R5383 Other fatigue: Secondary | ICD-10-CM | POA: Diagnosis not present

## 2015-11-21 DIAGNOSIS — K31811 Angiodysplasia of stomach and duodenum with bleeding: Secondary | ICD-10-CM | POA: Diagnosis not present

## 2015-11-21 DIAGNOSIS — E7800 Pure hypercholesterolemia, unspecified: Secondary | ICD-10-CM | POA: Insufficient documentation

## 2015-11-21 DIAGNOSIS — I10 Essential (primary) hypertension: Secondary | ICD-10-CM | POA: Diagnosis not present

## 2015-11-21 DIAGNOSIS — R799 Abnormal finding of blood chemistry, unspecified: Secondary | ICD-10-CM | POA: Diagnosis present

## 2015-11-21 DIAGNOSIS — D649 Anemia, unspecified: Secondary | ICD-10-CM | POA: Diagnosis present

## 2015-11-21 HISTORY — DX: Chronic or unspecified gastric ulcer with hemorrhage: K25.4

## 2015-11-21 HISTORY — DX: Pneumonia, unspecified organism: J18.9

## 2015-11-21 HISTORY — DX: Personal history of other medical treatment: Z92.89

## 2015-11-21 HISTORY — DX: Type 2 diabetes mellitus without complications: E11.9

## 2015-11-21 HISTORY — DX: Iron deficiency anemia secondary to blood loss (chronic): D50.0

## 2015-11-21 HISTORY — DX: Angiodysplasia of stomach and duodenum without bleeding: K31.819

## 2015-11-21 LAB — IRON AND TIBC
IRON: 54 ug/dL (ref 28–170)
SATURATION RATIOS: 18 % (ref 10.4–31.8)
TIBC: 307 ug/dL (ref 250–450)
UIBC: 253 ug/dL

## 2015-11-21 LAB — GLUCOSE, CAPILLARY
Glucose-Capillary: 134 mg/dL — ABNORMAL HIGH (ref 65–99)
Glucose-Capillary: 136 mg/dL — ABNORMAL HIGH (ref 65–99)

## 2015-11-21 LAB — CBC WITH DIFFERENTIAL/PLATELET
BASOS ABS: 0.1 10*3/uL (ref 0.0–0.1)
Basophils Relative: 1 %
Eosinophils Absolute: 0.5 10*3/uL (ref 0.0–0.7)
Eosinophils Relative: 6 %
HEMATOCRIT: 18.7 % — AB (ref 36.0–46.0)
HEMOGLOBIN: 5.4 g/dL — AB (ref 12.0–15.0)
LYMPHS ABS: 1.5 10*3/uL (ref 0.7–4.0)
LYMPHS PCT: 18 %
MCH: 23.1 pg — AB (ref 26.0–34.0)
MCHC: 28.9 g/dL — ABNORMAL LOW (ref 30.0–36.0)
MCV: 79.9 fL (ref 78.0–100.0)
Monocytes Absolute: 1.1 10*3/uL — ABNORMAL HIGH (ref 0.1–1.0)
Monocytes Relative: 14 %
NEUTROS ABS: 5 10*3/uL (ref 1.7–7.7)
NEUTROS PCT: 61 %
PLATELETS: 508 10*3/uL — AB (ref 150–400)
RBC: 2.34 MIL/uL — AB (ref 3.87–5.11)
RDW: 16 % — ABNORMAL HIGH (ref 11.5–15.5)
WBC: 8.2 10*3/uL (ref 4.0–10.5)

## 2015-11-21 LAB — LACTATE DEHYDROGENASE: LDH: 228 U/L — ABNORMAL HIGH (ref 98–192)

## 2015-11-21 LAB — HEMOGLOBIN AND HEMATOCRIT, BLOOD
HCT: 16.5 % — ABNORMAL LOW (ref 36.0–46.0)
HCT: 24.9 % — ABNORMAL LOW (ref 36.0–46.0)
HEMOGLOBIN: 5.2 g/dL — AB (ref 12.0–15.0)
Hemoglobin: 8 g/dL — ABNORMAL LOW (ref 12.0–15.0)

## 2015-11-21 LAB — PROTIME-INR
INR: 1.1 (ref 0.00–1.49)
PROTHROMBIN TIME: 14.4 s (ref 11.6–15.2)

## 2015-11-21 LAB — PREPARE RBC (CROSSMATCH)

## 2015-11-21 LAB — RETICULOCYTES
RBC.: 3.23 MIL/uL — AB (ref 3.87–5.11)
RETIC COUNT ABSOLUTE: 67.8 10*3/uL (ref 19.0–186.0)
Retic Ct Pct: 2.1 % (ref 0.4–3.1)

## 2015-11-21 LAB — POC OCCULT BLOOD, ED: FECAL OCCULT BLD: NEGATIVE

## 2015-11-21 LAB — FERRITIN: Ferritin: 34 ng/mL (ref 11–307)

## 2015-11-21 LAB — APTT: APTT: 27 s (ref 24–37)

## 2015-11-21 LAB — OCCULT BLOOD X 1 CARD TO LAB, STOOL: FECAL OCCULT BLD: NEGATIVE

## 2015-11-21 MED ORDER — ACETAMINOPHEN 650 MG RE SUPP: 650.0000 mg | Freq: Four times a day (QID) | RECTAL | Status: DC | PRN

## 2015-11-21 MED ORDER — POTASSIUM CHLORIDE CRYS ER 10 MEQ PO TBCR
10.0000 meq | EXTENDED_RELEASE_TABLET | Freq: Two times a day (BID) | ORAL | Status: DC
  Administered 2015-11-21 – 2015-11-22 (×2): 10 meq via ORAL
  Filled 2015-11-21 (×2): qty 1

## 2015-11-21 MED ORDER — ACETAMINOPHEN 325 MG PO TABS
650.0000 mg | ORAL_TABLET | Freq: Four times a day (QID) | ORAL | Status: DC | PRN
Start: 2015-11-21 — End: 2015-11-22

## 2015-11-21 MED ORDER — ATORVASTATIN CALCIUM 40 MG PO TABS
40.0000 mg | ORAL_TABLET | Freq: Every day | ORAL | Status: DC
  Administered 2015-11-21 – 2015-11-22 (×2): 40 mg via ORAL
  Filled 2015-11-21 (×2): qty 1

## 2015-11-21 MED ORDER — FUROSEMIDE 20 MG PO TABS
20.0000 mg | ORAL_TABLET | Freq: Every day | ORAL | Status: DC
  Administered 2015-11-21 – 2015-11-22 (×2): 20 mg via ORAL
  Filled 2015-11-21 (×2): qty 1

## 2015-11-21 MED ORDER — TRAZODONE HCL 50 MG PO TABS: 50.0000 mg | ORAL_TABLET | Freq: Every evening | ORAL | Status: DC | PRN

## 2015-11-21 MED ORDER — INSULIN ASPART 100 UNIT/ML ~~LOC~~ SOLN
0.0000 [IU] | Freq: Three times a day (TID) | SUBCUTANEOUS | Status: DC
  Administered 2015-11-21: 1 [IU] via SUBCUTANEOUS

## 2015-11-21 MED ORDER — FERROUS SULFATE 325 (65 FE) MG PO TABS
325.0000 mg | ORAL_TABLET | Freq: Every day | ORAL | Status: DC
  Administered 2015-11-22: 325 mg via ORAL
  Filled 2015-11-21: qty 1

## 2015-11-21 MED ORDER — POTASSIUM CHLORIDE CRYS ER 20 MEQ PO TBCR
40.0000 meq | EXTENDED_RELEASE_TABLET | Freq: Once | ORAL | Status: AC
  Administered 2015-11-21: 40 meq via ORAL
  Filled 2015-11-21: qty 2

## 2015-11-21 MED ORDER — SODIUM CHLORIDE 0.9% FLUSH
3.0000 mL | Freq: Two times a day (BID) | INTRAVENOUS | Status: DC
  Administered 2015-11-21: 3 mL via INTRAVENOUS

## 2015-11-21 MED ORDER — LEVOTHYROXINE SODIUM 25 MCG PO TABS
125.0000 ug | ORAL_TABLET | Freq: Every day | ORAL | Status: DC
  Administered 2015-11-22: 125 ug via ORAL
  Filled 2015-11-21 (×2): qty 1

## 2015-11-21 MED ORDER — PANTOPRAZOLE SODIUM 40 MG IV SOLR
40.0000 mg | Freq: Two times a day (BID) | INTRAVENOUS | Status: DC
  Administered 2015-11-21 – 2015-11-22 (×3): 40 mg via INTRAVENOUS
  Filled 2015-11-21 (×3): qty 40

## 2015-11-21 MED ORDER — SODIUM CHLORIDE 0.9 % IV SOLN
10.0000 mL/h | Freq: Once | INTRAVENOUS | Status: AC
  Administered 2015-11-21: 10 mL/h via INTRAVENOUS

## 2015-11-21 NOTE — Consult Note (Signed)
Marland Kitchen    HEMATOLOGY/ONCOLOGY CONSULTATION NOTE  Date of Service: 11/21/2015  Patient Care Team: [name], MD as PCP - General (Family Medicine)  CHIEF COMPLAINTS/PURPOSE OF CONSULTATION:  Anemia   HISTORY OF PRESENTING ILLNESS:   Connie [name] is a wonderful 82 y.o. female who has been referred to [place] by Dr .Nils Pyle for evaluation and management of anemia. Patient has a history of hypertension, hypothyroidism, diabetes and has had issues with recurrent gastrointestinal bleeding related to both upper and lower GI tract AVMs which have been ablated multiple times in the past. She's also had issues with gastric erosions in the past. She reports that on 2 occasions the bleeding has been severe enough to require blood transfusions.  She was recently admitted the hospital on 10/10/2015 with acute renal failure was thought to be related to her ACE inhibitor centimeters and Lasix. Her kidney function improved with hydration. Her hemoglobin during that episode on 10/11/2015 was noted to be 10.3 with an MCV of 79.4 Ferritin level of 37 with an iron saturation of 6%. B12 levels  1591.  Follow-up with primary care physician yesterday and is noting fatigue. She had labs drawn that showed significant drop in her hemoglobin and she was asked to come to the emergency room last night. Her hemoglobin was noted to be 5.4 with an MCV of 79.9 RDW of 16. Patient has elevated platelet counts of 508k. CBC count is within normal limits at 8.2. Creatinine is back to normal at 0.96 after having had acute renal failure in January 2017.  She notes that her stool is somewhat darker since she has been on oral iron replacement. Has not noted dark tarry black stools or overt blood in the stools. No other evidence of overt bleeding. No other specific focal symptoms suggesting overt bleeding. No hematuria. No epistaxis.  Has been on aspirin 81 mg by mouth daily for primary prophylaxis without any previous history  of heart attacks or strokes or significant peripheral vascular disease. Last dose that she took was yesterday.  Denies having dizziness chest pain or shortness of breath at this time.  MEDICAL HISTORY:  Past Medical History  Diagnosis Date  . Hypertension   . Obesity   . Arthritis   . Allergy     RHINITIS  . Hypothyroidism   . Hypercholesteremia   . Anemia   . Diabetes mellitus     oral meds only    SURGICAL HISTORY: Past Surgical History  Procedure Laterality Date  . Total thyroidectomy    . Fracture surgery      RIGHT SHOULDER   . Joint replacement      RIGHT KNEE & LEFT KNEE  . Colonoscopy N/A 08/30/2014    Procedure: COLONOSCOPY;  Surgeon: Beryle Beams, MD;  Location: [facility];  Service: Endoscopy;  Laterality: N/A;  . Hot hemostasis N/A 08/30/2014    Procedure: HOT HEMOSTASIS (ARGON PLASMA COAGULATION/BICAP);  Surgeon: Beryle Beams, MD;  Location: [facility];  Service: Endoscopy;  Laterality: N/A;  . Colonoscopy N/A 02/21/2015    Procedure: COLONOSCOPY;  Surgeon: Carol Ada, MD;  Location: [facility] ENDOSCOPY;  Service: Endoscopy;  Laterality: N/A;  . Givens capsule study N/A 05/15/2015    Procedure: GIVENS CAPSULE STUDY;  Surgeon: Carol Ada, MD;  Location: [place];  Service: Endoscopy;  Laterality: N/A;  . Enteroscopy N/A 06/06/2015    Procedure: ENTEROSCOPY;  Surgeon: Carol Ada, MD;  Location: [facility];  Service: Endoscopy;  Laterality: N/A;  . Hot  hemostasis N/A 06/06/2015    Procedure: HOT HEMOSTASIS (ARGON PLASMA COAGULATION/BICAP);  Surgeon: Carol Ada, MD;  Location: [facility];  Service: Endoscopy;  Laterality: N/A;    SOCIAL HISTORY: Social History   Social History  . Marital Status: Married    Spouse Name: N/A  . Number of Children: N/A  . Years of Education: N/A   Occupational History  . Not on file.   Social History Main Topics  . Smoking status: Never Smoker   . Smokeless tobacco: Never Used  . Alcohol Use: No  . Drug Use:  No  . Sexual Activity: Not Currently   Other Topics Concern  . Not on file   Social History Narrative    FAMILY HISTORY: Family History  Problem Relation Age of Onset  . Anemia Sister   . Asthma Mother   . Ulcers Father     ALLERGIES:  has No Known Allergies.  MEDICATIONS:  Current Facility-Administered Medications  Medication Dose Route Frequency Provider Last Rate Last Dose  . pantoprazole (PROTONIX) injection 40 mg  40 mg Intravenous Q12H Lily Kocher, MD   40 mg at 11/21/15 1138   Current Outpatient Prescriptions  Medication Sig Dispense Refill  . aspirin 81 MG tablet Take 81 mg by mouth daily.      Marland Kitchen atorvastatin (LIPITOR) 40 MG tablet TAKE ONE TABLET BY MOUTH ONCE DAILY 90 tablet 3  . ferrous sulfate 325 (65 FE) MG tablet Take 1 tablet (325 mg total) by mouth daily with breakfast. 30 tablet 11  . furosemide (LASIX) 20 MG tablet TAKE 1 TABLET EVERY DAY 90 tablet 0  . levothyroxine (SYNTHROID, LEVOTHROID) 125 MCG tablet TAKE ONE TABLET BY MOUTH EVERY DAY 90 tablet 3  . pioglitazone (ACTOS) 30 MG tablet Take 1 tablet (30 mg total) by mouth daily. 90 tablet 1  . potassium chloride (K-DUR,KLOR-CON) 10 MEQ tablet TAKE 1 TABLET TWICE DAILY 180 tablet 0  . traZODone (DESYREL) 50 MG tablet Half tablet at night as needed for sleep 20 tablet 0  . [DISCONTINUED] potassium chloride (K-DUR) 10 MEQ tablet TAKE ONE TABLET BY MOUTH TWICE DAILY 60 tablet 0    REVIEW OF SYSTEMS:    10 Point review of Systems was done is negative except as noted above.  PHYSICAL EXAMINATION: ECOG PERFORMANCE STATUS: 1 - Symptomatic but completely ambulatory  . Filed Vitals:   11/21/15 1230 11/21/15 1430  BP: 157/44 157/48  Pulse: 72 73  Temp:    Resp: 15 18   Filed Weights   11/21/15 0356  Weight: 120 lb (54.432 kg)   .Body mass index is 24.22 kg/(m^2).  GENERAL:alert, in no acute distress and comfortable, appears pale. SKIN: No acute rashes. No petechiae. No ecchymosis.  EYES:  Conjunctival pallor noted. No scleral icterus.  OROPHARYNX: Mucous membrane moist NECK: supple, no JVD, small 1cm lymph node left upper neck  LYMPH:  no palpable lymphadenopathy in the cervical, axillary or inguinal LUNGS: clear to auscultation with normal respiratory effort HEART: regular rate & rhythm,  no murmurs and trace pedal edema ABDOMEN: abdomen soft, non-tender, normoactive bowel sounds  PSYCH: alert & oriented x 3 with fluent speech NEURO: no focal motor/sensory deficits  LABORATORY DATA:  I have reviewed the data as listed  . CBC Latest Ref Rng 11/21/2015 11/21/2015 11/21/2015  WBC 4.0 - 10.5 K/uL - - 8.2  Hemoglobin 12.0 - 15.0 g/dL 8.0(L) 5.2(LL) 5.4(LL)  Hematocrit 36.0 - 46.0 % 24.9(L) 16.5(L) 18.7(L)  Platelets 150 - 400 K/uL - -  508(H)    . CMP Latest Ref Rng 11/20/2015 10/15/2015 10/12/2015  Glucose 65 - 99 mg/dL 105(H) 94 117(H)  BUN 7 - 25 mg/dL 12 9 26(H)  Creatinine 0.60 - 0.88 mg/dL 0.96(H) 0.94(H) 1.74(H)  Sodium 135 - 146 mmol/L 142 139 140  Potassium 3.5 - 5.3 mmol/L 3.2(L) 3.8 3.0(L)  Chloride 98 - 110 mmol/L 103 105 105  CO2 20 - 31 mmol/L 27 24 26   Calcium 8.6 - 10.4 mg/dL 7.7(L) 8.1(L) 8.2(L)  Total Protein 6.1 - 8.1 g/dL 5.5(L) 5.8(L) -  Total Bilirubin 0.2 - 1.2 mg/dL 0.4 0.6 -  Alkaline Phos 33 - 130 U/L 57 72 -  AST 10 - 35 U/L 16 15 -  ALT 6 - 29 U/L 4(L) 5(L) -     RADIOGRAPHIC STUDIES: I have personally reviewed the radiological images as listed and agreed with the findings in the report. Nm Gastric Emptying  10/23/2015  CLINICAL DATA:  Patient reports early satiety, abdominal pain, nausea, vomiting and bloating with symptoms since May 2016. EXAM: NUCLEAR MEDICINE GASTRIC EMPTYING SCAN TECHNIQUE: After oral ingestion of radiolabeled meal, sequential abdominal images were obtained for 120 minutes. Residual percentage of activity remaining within the stomach was calculated at 60 and 120 minutes. RADIOPHARMACEUTICALS:  2.0 mCi Tc-54m MDP labeled  sulfur colloid orally COMPARISON:  Abdominal ultrasound, 10/10/2015 FINDINGS: Expected location of the stomach in the left upper quadrant. Ingested meal empties the stomach gradually over the course of the study with 63.4% retention at 60 min and 23.4% retention at 120 min (normal retention less than 30% at a 120 min). Exam extended to 3 hours with only 9.7% retention noted at this time point. IMPRESSION: Normal gastric emptying study. Electronically Signed   By: Lajean Manes M.D.   On: 10/23/2015 13:49    ASSESSMENT & PLAN:    82 year old Caucasian female with  #1 Microcytic anemia likely related to iron deficiency from chronic GI bleeding. No known disorder causing chronic inflammation at this time.  #2 history of recurrent GI bleeding related to upper and lower GI tract AVMs and gastric erosions.  #3 status post acute renal failure in January 2017 currently creatinine within normal limits-  #4 hypothyroidism on levothyroxine .  Plan  -Transfuse the patient. Hemoglobin of 8-9 or symptomatic . -Will get ferritin, iron profile,  reticulocyte count . -PT PTT to rule out any overt coagulopathy - though this is less likely -LDH and haptoglobin to rule out hemolysis. Preferred this is done on pretransfusion labs since posttransfusion white have some mild hemolytic changes. -Will give IV Feraheme if ferritin less than 100 . -Discontinue aspirin unless very strong indication to continue . Patient unfortunately will remain at high risk of recurrent GI bleeding given extensive AVMs in upper and lower GI tract . -GI consultation to consider endoscopy/colonoscopy to ablate any visible overtly bleeding AVMs . -We will need outpatient hematology follow-up to monitor and aggressively replace her iron IV. Follow-up with Dr. [name] within 2 weeks of hospital discharge.  -Would hold oral iron to avoid confusing  the clinical picture with regards to having dark stools.   please call with any additional  questions.  All of the patients questions were answered with apparent satisfaction. The patient knows to call the clinic with any problems, questions or concerns.  I spent 60 minutes counseling the patient face to face. The total time spent in the appointment was 60 minutes and more than 50% was on counseling and direct patient cares.  Sullivan Lone MD [place] AAHIVMS [facility] Hematology/Oncology Physician [facility]  (Office):       [phone] (Work cell):  [phone] (Fax):           [phone]  11/21/2015 3:05 PM

## 2015-11-21 NOTE — Progress Notes (Signed)
Patient seen after midnight, please see H&P.    Acute on chronic anemia, symptomatic- ? Fe def-- no labs drawn but Fe panel in Jan showed Fe def --s/p Transfusion of two units of PRBCs ordered by ED physician.  --IV PPI --Hold baby aspirin for now -heme negative x 2 -hematology consult- Dr. Alwyn Pea to see- Dr. Benson Norway  Edema --On oral lasix at home -recent admission for AKI  DM -SSI coverage.  Hypothyroidism --Continue home dose of levothyroxine  Connie Bear DO

## 2015-11-21 NOTE — Consult Note (Signed)
Reason for Consult: Anemia Referring Physician: [facility]  Lovena Le Rexrode HPI: This is an 82 year old female who is well-known to me for issues with anemia secondary to GI bleeding.  She has undergone multiple procedures since 2015 without an overt site of bleeding, however, she has known AVMs.  Initially it was thought that her bleeding was from a colonic AVM and that was successfully ablated.  She had recurrent anemia and then another colonoscopy was performed with negative results.  EGDs and enteroscopies were pursued with findings of gastric an small bowel AVMs, but all were none bleeding.  They were ablated with APC.  A capsule endoscopy confirmed more AVMs in the proximal small bowel and she underwent a double balloon enteroscopy at [facility] with further ablation of small bowel AVMs in December 2016.  Her HGB remained stable and then she felt weak again.  A repeat HGB showed that her HGB was at 5.4 g/dL, but this time she was heme negative.  No overt evidence of hemolysis and bone marrow dysfunction.  Her symptoms of fatigue came on rather suddenly when she was trying to perform yard work lately.  No significant issues with melena, hematochezia, hematemesis, hemoptysis, or hematuria.  Past Medical History  Diagnosis Date  . Hypertension   . Obesity   . Arthritis   . Allergy     RHINITIS  . Hypothyroidism   . Hypercholesteremia   . Anemia   . Diabetes mellitus     oral meds only    Past Surgical History  Procedure Laterality Date  . Total thyroidectomy    . Fracture surgery      RIGHT SHOULDER   . Joint replacement      RIGHT KNEE & LEFT KNEE  . Colonoscopy N/A 08/30/2014    Procedure: COLONOSCOPY;  Surgeon: Beryle Beams, MD;  Location: [facility] END[facility]COPY;  Service: Endoscopy;  Laterality: N/A;  . Hot hemostasis N/A 08/30/2014    Procedure: HOT HEMOSTASIS (ARGON PLASMA COAGULATION/BICAP);  Surgeon: Beryle Beams, MD;  Location: [facility];  Service: Endoscopy;   Laterality: N/A;  . Colonoscopy N/A 02/21/2015    Procedure: COLONOSCOPY;  Surgeon: Carol Ada, MD;  Location: [facility] ENDOSCOPY;  Service: Endoscopy;  Laterality: N/A;  . Givens capsule study N/A 05/15/2015    Procedure: GIVENS CAPSULE STUDY;  Surgeon: Carol Ada, MD;  Location: [place];  Service: Endoscopy;  Laterality: N/A;  . Enteroscopy N/A 06/06/2015    Procedure: ENTEROSCOPY;  Surgeon: Carol Ada, MD;  Location: [facility];  Service: Endoscopy;  Laterality: N/A;  . Hot hemostasis N/A 06/06/2015    Procedure: HOT HEMOSTASIS (ARGON PLASMA COAGULATION/BICAP);  Surgeon: Carol Ada, MD;  Location: [facility];  Service: Endoscopy;  Laterality: N/A;    Family History  Problem Relation Age of Onset  . Anemia Sister   . Asthma Mother   . Ulcers Father     Social History:  reports that she has never smoked. She has never used smokeless tobacco. She reports that she does not drink alcohol or use illicit drugs.  Allergies: No Known Allergies  Medications:  Scheduled: . pantoprazole (PROTONIX) IV  40 mg Intravenous Q12H   Continuous:   Results for orders placed or performed during the hospital encounter of 11/21/15 (from the past 24 hour(s))  CBC with Differential/Platelet     Status: Abnormal   Collection Time: 11/21/15  4:10 AM  Result Value Ref Range   WBC 8.2 4.0 - 10.5 K/uL   RBC 2.34 (  L) 3.87 - 5.11 MIL/uL   Hemoglobin 5.4 (LL) 12.0 - 15.0 g/dL   HCT 18.7 (L) 36.0 - 46.0 %   MCV 79.9 78.0 - 100.0 fL   MCH 23.1 (L) 26.0 - 34.0 pg   MCHC 28.9 (L) 30.0 - 36.0 g/dL   RDW 16.0 (H) 11.5 - 15.5 %   Platelets 508 (H) 150 - 400 K/uL   Neutrophils Relative % 61 %   Neutro Abs 5.0 1.7 - 7.7 K/uL   Lymphocytes Relative 18 %   Lymphs Abs 1.5 0.7 - 4.0 K/uL   Monocytes Relative 14 %   Monocytes Absolute 1.1 (H) 0.1 - 1.0 K/uL   Eosinophils Relative 6 %   Eosinophils Absolute 0.5 0.0 - 0.7 K/uL   Basophils Relative 1 %   Basophils Absolute 0.1 0.0 - 0.1 K/uL  Type and  screen     Status: None (Preliminary result)   Collection Time: 11/21/15  4:10 AM  Result Value Ref Range   ABO/RH(D) A POS    Antibody Screen NEG    Sample Expiration 11/24/2015    Unit Number [id number]    Blood Component Type RED CELLS,LR    Unit division 00    Status of Unit ISSUED    Transfusion Status OK TO TRANSFUSE    Crossmatch Result Compatible    Unit Number [id number]    Blood Component Type RED CELLS,LR    Unit division 00    Status of Unit ISSUED    Transfusion Status OK TO TRANSFUSE    Crossmatch Result Compatible   POC occult blood, ED     Status: None   Collection Time: 11/21/15  4:22 AM  Result Value Ref Range   Fecal Occult Bld NEGATIVE NEGATIVE  Prepare RBC     Status: None   Collection Time: 11/21/15  4:40 AM  Result Value Ref Range   Order Confirmation ORDER PROCESSED BY BLOOD BANK   Occult blood card to lab, stool     Status: None   Collection Time: 11/21/15 11:07 AM  Result Value Ref Range   Fecal Occult Bld NEGATIVE NEGATIVE  Hemoglobin and hematocrit, blood     Status: Abnormal   Collection Time: 11/21/15 12:18 PM  Result Value Ref Range   Hemoglobin 5.2 (LL) 12.0 - 15.0 g/dL   HCT 16.5 (L) 36.0 - 46.0 %     No results found.  ROS:  As stated above in the HPI otherwise negative.  Blood pressure 157/48, pulse 73, temperature 98 F (36.7 C), temperature source Oral, resp. rate 18, height 4\' 11"  (1.499 m), weight 54.432 kg (120 lb), SpO2 98 %.    PE: Gen: NAD, Alert and Oriented HEENT:  [place]/AT, EOMI Neck: Supple, no LAD Lungs: CTA Bilaterally CV: RRR without M/G/R ABM: Soft, NTND, +BS Ext: No C/C/E  Assessment/Plan: 1) Symptomatic anemia. 2) History of GI bleed. 3) Weakness.   From the GI standpoint I do not know of any other procedures or interventions to perform to evaluate and arrest any GI bleeding.  It is unusual that she is heme negative with such a significant anemia.  Regardless, if it is from a GI source versus hematologic  source, I think Hematology needs to evaluate the patient.  At the very least she needs to be on iron infusion therapy to stay ahead of her anemia.  Plan: 1) Agree with transfusions. 2) Hematology consultation. 3) Hold on any GI interventions at this time.  Wilburn Keir D 11/21/2015, 3:09 PM

## 2015-11-21 NOTE — ED Notes (Signed)
Attempted report 

## 2015-11-21 NOTE — ED Notes (Signed)
Admitting provider made aware that Hbg and Hct test results may have been contaminated. Admitting provider verbally ordered new blood test at this time.

## 2015-11-21 NOTE — Progress Notes (Signed)
   Subjective:    Patient ID: Connie [name], female    DOB: Nov 28, 1933, 82 y.o.   MRN: [id number]  HPI    Review of Systems     Objective:   Physical Exam        Assessment & Plan:  I was called last night concerning her hemoglobin. I called Kennyth Lose immediately and told her to go to the emergency room. I checked this morning at 7:45 and she was giving blood with admission orders.

## 2015-11-21 NOTE — Plan of Care (Signed)
Problem: Education: Goal: Knowledge of [facility] Education information/materials will improve Outcome: Progressing Pt reports to be feeling "alot better", hemoglobin on admission 5.2, now 8. hemoccults neg X2. VSS. Pt has DM which is diet controlled at home, educated about insulin coverage and CBG checks here. Pt has no further questions at this time. No pain. VSS. Husband at beside. Educated about call bell and phone. Will continue to monitor.

## 2015-11-21 NOTE — H&P (Signed)
[facility] History and Physical  Connie [name] [id number] DOB: [birth year] DOA: 11/21/2015  PCP: Wyatt Haste, MD  Specialists: Followed by GI at [facility]  Chief Complaint: Weakness  HPI: Connie [name] is a 82 y.o. woman who presents to the ED with her husband complaining of weakness.  She is found to have a hemoglobin of 5.4.  She has a history of chronic blood loss anemia secondary to angiodysplasia of the stomach and duodenum as well as history of bleeding gastric ulcer.  She required blood transfusions in May and October of last year.  Her last endoscopy was in December at [facility].  She is only on a baby aspirin at home.  She denies chest pain, shortness of breath, light-headedness, or syncope.  She notes increased leg swelling lately.  Stools have been "normal" in color (dark at baseline because she is on chronic iron).  She denies BRBPR.  She denies melena.  She has had intermittent N/V that has been nonbloody.  Hospitalist asked to admit for observation for blood transfusion and GI consult.  Review of Systems: 12 systems reviewed and negative except as stated in HPI.  Past Medical History  Diagnosis Date  . Hypertension   . Obesity   . Arthritis   . Allergy     RHINITIS  . Hypothyroidism   . Hypercholesteremia   . Anemia   . Diabetes mellitus     oral meds only   Past Surgical History  Procedure Laterality Date  . Total thyroidectomy    . Fracture surgery      RIGHT SHOULDER   . Joint replacement      RIGHT KNEE & LEFT KNEE  . Colonoscopy N/A 08/30/2014    Procedure: COLONOSCOPY;  Surgeon: Beryle Beams, MD;  Location: [facility];  Service: Endoscopy;  Laterality: N/A;  . Hot hemostasis N/A 08/30/2014    Procedure: HOT HEMOSTASIS (ARGON PLASMA COAGULATION/BICAP);  Surgeon: Beryle Beams, MD;  Location: [facility];  Service: Endoscopy;  Laterality: N/A;  . Colonoscopy N/A 02/21/2015    Procedure: COLONOSCOPY;  Surgeon:  Carol Ada, MD;  Location: [facility] ENDOSCOPY;  Service: Endoscopy;  Laterality: N/A;  . Givens capsule study N/A 05/15/2015    Procedure: GIVENS CAPSULE STUDY;  Surgeon: Carol Ada, MD;  Location: [place];  Service: Endoscopy;  Laterality: N/A;  . Enteroscopy N/A 06/06/2015    Procedure: ENTEROSCOPY;  Surgeon: Carol Ada, MD;  Location: [facility];  Service: Endoscopy;  Laterality: N/A;  . Hot hemostasis N/A 06/06/2015    Procedure: HOT HEMOSTASIS (ARGON PLASMA COAGULATION/BICAP);  Surgeon: Carol Ada, MD;  Location: [facility];  Service: Endoscopy;  Laterality: N/A;   Social History:  Social History   Social History Narrative  No tobacco, EtOH, or illicit drug use.  She is married.  She has two adult children.  No Known Allergies  Family History  Problem Relation Age of Onset  . Anemia Sister   . Asthma Mother   . Ulcers Father    Prior to Admission medications   Medication Sig Start Date End Date Taking? Authorizing Provider  aspirin 81 MG tablet Take 81 mg by mouth daily.     Yes Historical Provider, MD  atorvastatin (LIPITOR) 40 MG tablet TAKE ONE TABLET BY MOUTH ONCE DAILY 09/01/15  Yes [name], MD  ferrous sulfate 325 (65 FE) MG tablet Take 1 tablet (325 mg total) by mouth daily with breakfast. 04/03/15  Yes [name], MD  furosemide (LASIX)  20 MG tablet TAKE 1 TABLET EVERY DAY 11/05/15  Yes [name], MD  levothyroxine (SYNTHROID, LEVOTHROID) 125 MCG tablet TAKE ONE TABLET BY MOUTH EVERY DAY 10/28/14  Yes [name], MD  pioglitazone (ACTOS) 30 MG tablet Take 1 tablet (30 mg total) by mouth daily. 07/24/15  Yes [name], MD  potassium chloride (K-DUR,KLOR-CON) 10 MEQ tablet TAKE 1 TABLET TWICE DAILY 11/05/15  Yes [name], MD  traZODone (DESYREL) 50 MG tablet Half tablet at night as needed for sleep 11/20/15  Yes [name], MD   Physical Exam: Filed Vitals:   11/21/15 0500 11/21/15 0506 11/21/15 0530 11/21/15 0547  BP: 157/59 157/59  150/51 170/56  Pulse: 78 81 78 80  Temp:  98 F (36.7 C)  97.9 F (36.6 C)  TempSrc:  Oral  Oral  Resp: 14 14 18 13   Height:      Weight:      SpO2: 96% 98% 97% 98%    General:  Awake and alert.  Oriented to person, place, time and situation.  NAD.  Eyes: PERRL bilaterally, conjunctiva are pale.  EOMI.  ENT: Mucous membranes are slightly dry.  No nasal drainage.  Neck: Supple.   Cardiovascular: NR/RR.  1+ pitting edema bilaterally.  Respiratory: CTA bilaterally.  Abdomen: Soft/NT/ND.  Bowel sounds are present.  No guarding.  Skin: Warm and dry.  Musculoskeletal: Moves all four extremities spontaneously.  Psychiatric: Normal affect.  Neurologic: No focal deficits.   Labs on Admission:  Basic Metabolic Panel:  Recent Labs Lab 11/20/15 0001  NA 142  K 3.2*  CL 103  CO2 27  GLUCOSE 105*  BUN 12  CREATININE 0.96*  CALCIUM 7.7*   Liver Function Tests:  Recent Labs Lab 11/20/15 0001  AST 16  ALT 4*  ALKPHOS 57  BILITOT 0.4  PROT 5.5*  ALBUMIN 2.9*   CBC:  Recent Labs Lab 11/20/15 0001 11/21/15 0410  WBC 6.4 8.2  NEUTROABS 4.4 5.0  HGB 5.4* 5.4*  HCT 17.8* 18.7*  MCV 78.8 79.9  PLT 478* 508*    Assessment/Plan Principal Problem:   Acute blood loss anemia Active Problems:   Symptomatic anemia  Admit for observation, telemetry  Acute on chronic anemia, symptomatic --Transfusion of two units of PRBCs ordered by ED physician.  She may need a third unit, as well as IV lasix after the second unit. --IV PPI --Clear liquid diet --She will need GI consult. --Hold baby aspirin for now  Edema --On oral lasix at home; consider echo though she has not had an symptoms suggestive of acute coronary syndrom.  DM --Hold oral med.  SSI coverage.  Hypothyroidism --Continue home dose of levothyroxine  Code Status: FULL Family Communication: Husband at bedside Disposition Plan: To be determined based on GI recommendations.  Time spent: 40  minutes  [employer] [facility]  11/21/2015, 6:49 AM

## 2015-11-21 NOTE — ED Notes (Signed)
Pt states that she went to see her PCP yesterday and was called around 10pm due to low Hbg, pt states she had just took a sleeping pill and was unable to get up. Pt has hx of low hbg and transfusions in the past.

## 2015-11-21 NOTE — ED Notes (Signed)
Admitting at bedside 

## 2015-11-21 NOTE — ED Provider Notes (Signed)
CSN: [id number]     Arrival date & time 11/21/15  0346 History   By signing my name below, I, Connie [name], attest that this documentation has been prepared under the direction and in the presence of Connie Fraise, MD.   Electronically Signed: Nicole [name], ED Scribe. 11/21/2015. 3:58 AM    Chief Complaint  Patient presents with  . Abnormal Lab    The history is provided by the patient. No language interpreter was used.   HPI Comments: Connie [name] is a 82 y.o. female who presents to the Emergency Department complaining of an abnormal lab result. Pt received a call from her doctor at around 10 PM on 11/20/2015 saying that she had low hemoglobin and was instructed to come to the ED. Pt reports associated leg swelling, onset a few days ago. Pt denies chest pain, vomiting, nausea, vaginal bleed, hematuria, hematochezia, syncope, or any other pertinent symptoms. Pt has had three colonoscopy and three endoscopies in the past year. Pt is not on anticoagulants. Pt has required two units of blood on two different occasions in the past.  She reports mild weakness. Nothing worsens symptoms Her symptoms are stable at this time   Pt has h/o AVM and has been managed by GI in the past.   Past Medical History  Diagnosis Date  . Hypertension   . Obesity   . Arthritis   . Allergy     RHINITIS  . Hypothyroidism   . Hypercholesteremia   . Anemia   . Diabetes mellitus     oral meds only   Past Surgical History  Procedure Laterality Date  . Total thyroidectomy    . Fracture surgery      RIGHT SHOULDER   . Joint replacement      RIGHT KNEE & LEFT KNEE  . Colonoscopy N/A 08/30/2014    Procedure: COLONOSCOPY;  Surgeon: Beryle Beams, MD;  Location: [facility];  Service: Endoscopy;  Laterality: N/A;  . Hot hemostasis N/A 08/30/2014    Procedure: HOT HEMOSTASIS (ARGON PLASMA COAGULATION/BICAP);  Surgeon: Beryle Beams, MD;  Location: [facility];  Service: Endoscopy;   Laterality: N/A;  . Colonoscopy N/A 02/21/2015    Procedure: COLONOSCOPY;  Surgeon: Carol Ada, MD;  Location: [facility] END[facility]COPY;  Service: Endoscopy;  Laterality: N/A;  . Givens capsule study N/A 05/15/2015    Procedure: GIVENS CAPSULE STUDY;  Surgeon: Carol Ada, MD;  Location: [place];  Service: Endoscopy;  Laterality: N/A;  . Enteroscopy N/A 06/06/2015    Procedure: ENTEROSCOPY;  Surgeon: Carol Ada, MD;  Location: [facility];  Service: Endoscopy;  Laterality: N/A;  . Hot hemostasis N/A 06/06/2015    Procedure: HOT HEMOSTASIS (ARGON PLASMA COAGULATION/BICAP);  Surgeon: Carol Ada, MD;  Location: [facility];  Service: Endoscopy;  Laterality: N/A;   Family History  Problem Relation Age of Onset  . Anemia Sister   . Asthma Mother   . Ulcers Father    Social History  Substance Use Topics  . Smoking status: Never Smoker   . Smokeless tobacco: Never Used  . Alcohol Use: No   OB History    No data available     Review of Systems  Constitutional: Positive for fatigue.  Cardiovascular: Positive for leg swelling. Negative for chest pain.  Gastrointestinal: Negative for nausea, vomiting and blood in stool.  Genitourinary: Negative for hematuria and vaginal bleeding.  Neurological: Negative for syncope and weakness.  All other systems reviewed and are negative.     Allergies  Review of patient's allergies indicates no known allergies.  Home Medications   Prior to Admission medications   Medication Sig Start Date End Date Taking? Authorizing Provider  aspirin 81 MG tablet Take 81 mg by mouth daily.      Historical Provider, MD  atorvastatin (LIPITOR) 40 MG tablet TAKE ONE TABLET BY MOUTH ONCE DAILY 09/01/15   [name], MD  ferrous sulfate 325 (65 FE) MG tablet Take 1 tablet (325 mg total) by mouth daily with breakfast. 04/03/15   [name], MD  furosemide (LASIX) 20 MG tablet TAKE 1 TABLET EVERY DAY 11/05/15   [name], MD  levothyroxine (SYNTHROID,  LEVOTHROID) 125 MCG tablet TAKE ONE TABLET BY MOUTH EVERY DAY 10/28/14   [name], MD  pioglitazone (ACTOS) 30 MG tablet Take 1 tablet (30 mg total) by mouth daily. 07/24/15   [name], MD  potassium chloride (K-DUR,KLOR-CON) 10 MEQ tablet TAKE 1 TABLET TWICE DAILY 11/05/15   [name], MD  traZODone (DESYREL) 50 MG tablet Half tablet at night as needed for sleep 11/20/15   [name], MD   BP 154/87 mmHg  Pulse 83  Temp(Src) 97.9 F (36.6 C)  Resp 20  Ht 4\' 11"  (1.499 m)  Wt 120 lb (54.432 kg)  BMI 24.22 kg/m2  SpO2 100% Physical Exam CONSTITUTIONAL: Well developed/well nourished HEAD: Normocephalic/atraumatic EYES: Conjunctiva pale.  ENMT: Mucous membranes moist NECK: supple no meningeal signs SPINE/BACK:entire spine nontender CV: S1/S2 noted LUNGS: Lungs are clear to auscultation bilaterally, no apparent distress ABDOMEN: soft, nontender, no rebound or guarding, bowel sounds noted throughout abdomen Rectal: Stool color yellow. No blood or melena. Chaperone nurse Janett Billow present.  NEURO: Pt is awake/alert/appropriate, moves all extremitiesx4.  No facial droop.   EXTREMITIES: pulses normal/equal, full ROM SKIN: warm, color normal PSYCH: no abnormalities of mood noted, alert and oriented to situation  ED Course  Procedures  CRITICAL CARE Performed by: Sharyon Cable Total critical care time: 30 minutes Critical care time was exclusive of separately billable procedures and treating other patients. Critical care was necessary to treat or prevent imminent or life-threatening deterioration. Critical care was time spent personally by me on the following activities: development of treatment plan with patient and/or surrogate as well as nursing, discussions with consultants, evaluation of patient's response to treatment, examination of patient, obtaining history from patient or surrogate, ordering and performing treatments and interventions, ordering and review of  laboratory studies, ordering and review of radiographic studies, pulse oximetry and re-evaluation of patient's condition. PATIENT WITH SYMPTOMATIC ANEMIA, HEMOGLOBIN AT 5.4 AND PACKED RED CELLS HAVE BEEN ORDERED FOR TRANSFUSION  DIAGNOSTIC STUDIES: Oxygen Saturation is 100% on RA, normal by my interpretation.    COORDINATION OF CARE: 4:02 AM-Discussed treatment plan which includes CBC with differential/platelet with pt at bedside and pt agreed to plan.   5:01 AM Pt confirmed to have anemia, HGB 5.4 Stool was without blood/melena BP is appropriate Will need admission She agrees to have blood transfusion as she had them before without reaction D/w dr Eulas Post with [facility] will admit   Labs Review Labs Reviewed  CBC WITH DIFFERENTIAL/PLATELET - Abnormal; Notable for the following:    RBC 2.34 (*)    Hemoglobin 5.4 (*)    HCT 18.7 (*)    MCH 23.1 (*)    MCHC 28.9 (*)    RDW 16.0 (*)    Platelets 508 (*)    Monocytes Absolute 1.1 (*)    All  other components within normal limits  POC OCCULT BLOOD, ED  TYPE AND SCREEN  PREPARE RBC (CROSSMATCH)   I have personally reviewed and evaluated these  lab results as part of my medical decision-making.  Medications  0.9 %  sodium chloride infusion (not administered)     MDM   Final diagnoses:  Acute blood loss anemia    Nursing notes including past medical history and social history reviewed and considered in documentation Labs/vital reviewed myself and considered during evaluation   I personally performed the services described in this documentation, which was scribed in my presence. The recorded information has been reviewed and is accurate.       Connie Fraise, MD 11/21/15 [phone]

## 2015-11-22 DIAGNOSIS — D62 Acute posthemorrhagic anemia: Secondary | ICD-10-CM | POA: Diagnosis not present

## 2015-11-22 DIAGNOSIS — D649 Anemia, unspecified: Secondary | ICD-10-CM | POA: Diagnosis not present

## 2015-11-22 LAB — CBC
HEMATOCRIT: 23 % — AB (ref 36.0–46.0)
HEMOGLOBIN: 7 g/dL — AB (ref 12.0–15.0)
MCH: 24.7 pg — ABNORMAL LOW (ref 26.0–34.0)
MCHC: 30.4 g/dL (ref 30.0–36.0)
MCV: 81.3 fL (ref 78.0–100.0)
Platelets: 399 10*3/uL (ref 150–400)
RBC: 2.83 MIL/uL — ABNORMAL LOW (ref 3.87–5.11)
RDW: 15.9 % — AB (ref 11.5–15.5)
WBC: 8.9 10*3/uL (ref 4.0–10.5)

## 2015-11-22 LAB — CBC WITH DIFFERENTIAL/PLATELET
BASOS PCT: 1 %
Basophils Absolute: 0.1 10*3/uL (ref 0.0–0.1)
EOS ABS: 0.4 10*3/uL (ref 0.0–0.7)
EOS PCT: 4 %
HCT: 32.5 % — ABNORMAL LOW (ref 36.0–46.0)
Hemoglobin: 10.6 g/dL — ABNORMAL LOW (ref 12.0–15.0)
LYMPHS ABS: 1.6 10*3/uL (ref 0.7–4.0)
Lymphocytes Relative: 18 %
MCH: 27.5 pg (ref 26.0–34.0)
MCHC: 32.6 g/dL (ref 30.0–36.0)
MCV: 84.4 fL (ref 78.0–100.0)
MONOS PCT: 11 %
Monocytes Absolute: 1 10*3/uL (ref 0.1–1.0)
Neutro Abs: 5.9 10*3/uL (ref 1.7–7.7)
Neutrophils Relative %: 65 %
PLATELETS: 416 10*3/uL — AB (ref 150–400)
RBC: 3.85 MIL/uL — AB (ref 3.87–5.11)
RDW: 16.4 % — ABNORMAL HIGH (ref 11.5–15.5)
WBC: 9.1 10*3/uL (ref 4.0–10.5)

## 2015-11-22 LAB — BASIC METABOLIC PANEL
ANION GAP: 8 (ref 5–15)
BUN: 13 mg/dL (ref 6–20)
CALCIUM: 7.6 mg/dL — AB (ref 8.9–10.3)
CHLORIDE: 109 mmol/L (ref 101–111)
CO2: 25 mmol/L (ref 22–32)
Creatinine, Ser: 0.94 mg/dL (ref 0.44–1.00)
GFR calc non Af Amer: 56 mL/min — ABNORMAL LOW (ref >60)
GLUCOSE: 101 mg/dL — AB (ref 65–99)
Potassium: 3.1 mmol/L — ABNORMAL LOW (ref 3.5–5.1)
Sodium: 142 mmol/L (ref 135–145)

## 2015-11-22 LAB — GLUCOSE, CAPILLARY
GLUCOSE-CAPILLARY: 131 mg/dL — AB (ref 65–99)
Glucose-Capillary: 93 mg/dL (ref 65–99)
Glucose-Capillary: 89 mg/dL (ref 65–99)

## 2015-11-22 LAB — OCCULT BLOOD X 1 CARD TO LAB, STOOL: FECAL OCCULT BLD: POSITIVE — AB

## 2015-11-22 LAB — PREPARE RBC (CROSSMATCH)

## 2015-11-22 LAB — HAPTOGLOBIN: Haptoglobin: 208 mg/dL — ABNORMAL HIGH (ref 34–200)

## 2015-11-22 LAB — ERYTHROPOIETIN: ERYTHROPOIETIN: 96.4 m[IU]/mL — AB (ref 2.6–18.5)

## 2015-11-22 MED ORDER — POTASSIUM CHLORIDE CRYS ER 20 MEQ PO TBCR
40.0000 meq | EXTENDED_RELEASE_TABLET | Freq: Two times a day (BID) | ORAL | Status: DC
  Administered 2015-11-22: 40 meq via ORAL
  Filled 2015-11-22: qty 2

## 2015-11-22 MED ORDER — FUROSEMIDE 10 MG/ML IJ SOLN
20.0000 mg | Freq: Once | INTRAMUSCULAR | Status: AC
  Administered 2015-11-22: 20 mg via INTRAVENOUS
  Filled 2015-11-22: qty 2

## 2015-11-22 MED ORDER — PANTOPRAZOLE SODIUM 40 MG PO TBEC: 40.0000 mg | DELAYED_RELEASE_TABLET | Freq: Two times a day (BID) | ORAL | Status: DC

## 2015-11-22 MED ORDER — PANTOPRAZOLE SODIUM 20 MG PO TBEC: 20.0000 mg | DELAYED_RELEASE_TABLET | Freq: Every day | ORAL | Status: DC

## 2015-11-22 MED ORDER — SODIUM CHLORIDE 0.9 % IV SOLN
Freq: Once | INTRAVENOUS | Status: AC
Start: 2015-11-22 — End: 2015-11-22
  Administered 2015-11-22: 11:00:00 via INTRAVENOUS

## 2015-11-22 NOTE — Discharge Summary (Signed)
Physician Discharge Summary  Connie [name] [id number] DOB: 1934/09/01 DOA: 11/21/2015  PCP: Wyatt Haste, MD  Admit date: 11/21/2015 Discharge date: 11/22/2015  Time spent: 35 minutes  Recommendations for Outpatient Follow-up:  1. Follow-up with hematology and to 4 weeks would check a CBC and a ferritin at this point. He was given IV iron in house. 2. We will also discuss with the patient her options.   Discharge Diagnoses:  Principal Problem:   Acute blood loss anemia Active Problems:   Symptomatic anemia   Discharge Condition: stable  Diet recommendation: regular  Filed Weights   11/21/15 0356 11/21/15 1558 11/22/15 0533  Weight: 54.432 kg (120 lb) 55.52 kg (122 lb 6.4 oz) 55.293 kg (121 lb 14.4 oz)    History of present illness:  Connie [name] is an 82 y.o. female presents to the ED complaining of generalized weakness, in the ED was found to have a hemoglobin of 5, she has a history of angiodysplasia of the stomach and duodenum in the past. Require multiple blood transfusions. Her last endoscopy was a [facility] on December of last year, she is only on aspirin at home.  Hospital Course:  Acute blood loss anemia likely due to GI losses: She status post 4 units of packed red blood cells, aspirin was held. GI was consulted who recommended no further intervention. They found puzzling that her FOBT is negative despite a significant drop in her hemoglobin and recommended a hematology evaluation. Her ferritin was 34 with saturation of 18. Hematology related that this is probably iron deficiency anemia that is chronic from a GI source. LDH was 228, haptoglobin is greater than 200 which makes hemolysis unlikely, PT and INR were 14 1.1. Recommended IV iron and discontinue aspirin. He also recommended to discontinue oral iron to avoid confusion of her clinical picture in the future with dark stools. And recommended follow-up with them as an outpatient in 2  weeks.  All other medical problems were stable no changes were made to her medication.  Procedures:  CXR   Consultations:  Gastronenterology  Hematology  Discharge Exam: Filed Vitals:   11/21/15 2117 11/22/15 0533  BP: 168/51 148/54  Pulse: 77 83  Temp: 98 F (36.7 C) 98.3 F (36.8 C)  Resp: 16 16    General: A&O x3 Cardiovascular: RRR Respiratory: good air movement CTA B/L  Discharge Instructions   Discharge Instructions    Diet - low sodium heart healthy    Complete by:  As directed      Increase activity slowly    Complete by:  As directed           Current Discharge Medication List    START taking these medications   Details  pantoprazole (PROTONIX) 20 MG tablet Take 1 tablet (20 mg total) by mouth daily. Qty: 30 tablet, Refills: 3      CONTINUE these medications which have NOT CHANGED   Details  atorvastatin (LIPITOR) 40 MG tablet TAKE ONE TABLET BY MOUTH ONCE DAILY Qty: 90 tablet, Refills: 3   Associated Diagnoses: Hyperlipidemia associated with type 2 diabetes mellitus ([name])    furosemide (LASIX) 20 MG tablet TAKE 1 TABLET EVERY DAY Qty: 90 tablet, Refills: 0    levothyroxine (SYNTHROID, LEVOTHROID) 125 MCG tablet TAKE ONE TABLET BY MOUTH EVERY DAY Qty: 90 tablet, Refills: 3    pioglitazone (ACTOS) 30 MG tablet Take 1 tablet (30 mg total) by mouth daily. Qty: 90 tablet, Refills: 1    potassium chloride (K-DUR,KLOR-CON)  10 MEQ tablet TAKE 1 TABLET TWICE DAILY Qty: 180 tablet, Refills: 0    traZODone (DESYREL) 50 MG tablet Half tablet at night as needed for sleep Qty: 20 tablet, Refills: 0   Associated Diagnoses: Dependent edema      STOP taking these medications     aspirin 81 MG tablet      ferrous sulfate 325 (65 FE) MG tablet        No Known Allergies Follow-up Information    Follow up with HUNG,PATRICK D, MD In 3 weeks.   Specialty:  Gastroenterology   Contact information:   [address] [place]  [address] [phone]       Follow up with Sullivan Lone, MD In 2 weeks.   Specialties:  Hematology, Oncology   Why:  Hospital follow-up   Contact information:   [place] [address] [phone]        [name] (including imaging, microbiology, ancillary and laboratory) are listed below for reference.    Significant Diagnostic Studies: Nm Gastric Emptying  10/23/2015  CLINICAL DATA:  Patient reports early satiety, abdominal pain, nausea, vomiting and bloating with symptoms since May 2016. EXAM: NUCLEAR MEDICINE GASTRIC EMPTYING SCAN TECHNIQUE: After oral ingestion of radiolabeled meal, sequential abdominal images were obtained for 120 minutes. Residual percentage of activity remaining within the stomach was calculated at 60 and 120 minutes. RADIOPHARMACEUTICALS:  2.0 mCi Tc-82m MDP labeled sulfur colloid orally COMPARISON:  Abdominal ultrasound, 10/10/2015 FINDINGS: Expected location of the stomach in the left upper quadrant. Ingested meal empties the stomach gradually over the course of the study with 63.4% retention at 60 min and 23.4% retention at 120 min (normal retention less than 30% at a 120 min). Exam extended to 3 hours with only 9.7% retention noted at this time point. IMPRESSION: Normal gastric emptying study. Electronically Signed   By: Lajean Manes M.D.   On: 10/23/2015 13:49    Microbiology: No results found for this or any previous visit (from the past 240 hour(s)).   Labs: Basic Metabolic Panel:  Recent Labs Lab 11/20/15 0001 11/22/15 0521  NA 142 142  K 3.2* 3.1*  CL 103 109  CO2 27 25  GLUCOSE 105* 101*  BUN 12 13  CREATININE 0.96* 0.94  CALCIUM 7.7* 7.6*   Liver Function Tests:  Recent Labs Lab 11/20/15 0001  AST 16  ALT 4*  ALKPHOS 57  BILITOT 0.4  PROT 5.5*  ALBUMIN 2.9*   No results for input(s): LIPASE, AMYLASE in the last 168 hours. No results for input(s): AMMONIA in the  last 168 hours. CBC:  Recent Labs Lab 11/20/15 0001 11/21/15 0410 11/21/15 1218 11/21/15 1453 11/22/15 0521  WBC 6.4 8.2  --   --  8.9  NEUTROABS 4.4 5.0  --   --   --   HGB 5.4* 5.4* 5.2* 8.0* 7.0*  HCT 17.8* 18.7* 16.5* 24.9* 23.0*  MCV 78.8 79.9  --   --  81.3  PLT 478* 508*  --   --  399   Cardiac Enzymes: No results for input(s): CKTOTAL, CKMB, CKMBINDEX, TROPONINI in the last 168 hours. BNP: BNP (last 3 results) No results for input(s): BNP in the last 8760 hours.  ProBNP (last 3 results) No results for input(s): PROBNP in the last 8760 hours.  CBG:  Recent Labs Lab 11/21/15 1604 11/21/15 2113 11/22/15 0733  GLUCAP 134* 136* 93  Signed:  Charlynne Cousins MD.  [facility] 11/22/2015, 10:00 AM

## 2015-11-23 LAB — TYPE AND SCREEN
ABO/RH(D): A POS
ANTIBODY SCREEN: NEGATIVE
UNIT DIVISION: 0
UNIT DIVISION: 0
Unit division: 0
Unit division: 0

## 2015-11-24 ENCOUNTER — Encounter: Payer: Self-pay | Admitting: Hematology

## 2015-11-24 LAB — MULTIPLE MYELOMA PANEL, SERUM
ALBUMIN/GLOB SERPL: 1 (ref 0.7–1.7)
ALPHA2 GLOB SERPL ELPH-MCNC: 0.7 g/dL (ref 0.4–1.0)
Albumin SerPl Elph-Mcnc: 2.3 g/dL — ABNORMAL LOW (ref 2.9–4.4)
Alpha 1: 0.3 g/dL (ref 0.0–0.4)
B-GLOBULIN SERPL ELPH-MCNC: 0.8 g/dL (ref 0.7–1.3)
Gamma Glob SerPl Elph-Mcnc: 0.6 g/dL (ref 0.4–1.8)
Globulin, Total: 2.4 g/dL (ref 2.2–3.9)
IGM, SERUM: 34 mg/dL (ref 26–217)
IgA: 125 mg/dL (ref 64–422)
IgG (Immunoglobin G), Serum: 684 mg/dL — ABNORMAL LOW (ref 700–1600)
TOTAL PROTEIN ELP: 4.7 g/dL — AB (ref 6.0–8.5)

## 2015-11-26 ENCOUNTER — Telehealth: Payer: Self-pay | Admitting: Family Medicine

## 2015-11-26 NOTE — Telephone Encounter (Signed)
With everything that has been going on with her, I think I need to see her

## 2015-11-26 NOTE — Telephone Encounter (Signed)
Wants Rx for fluid in her legs , both legs  More in left than right  Please call

## 2015-11-26 NOTE — Telephone Encounter (Signed)
Schedule pt for tomorrow at 2:30pm.

## 2015-11-27 ENCOUNTER — Telehealth: Payer: Self-pay | Admitting: *Deleted

## 2015-11-27 ENCOUNTER — Ambulatory Visit (INDEPENDENT_AMBULATORY_CARE_PROVIDER_SITE_OTHER): Payer: Commercial Managed Care - HMO | Admitting: Family Medicine

## 2015-11-27 ENCOUNTER — Encounter: Payer: Self-pay | Admitting: Family Medicine

## 2015-11-27 VITALS — BP 150/70 | HR 64 | Temp 98.4°F | Wt 128.2 lb

## 2015-11-27 DIAGNOSIS — K31811 Angiodysplasia of stomach and duodenum with bleeding: Secondary | ICD-10-CM

## 2015-11-27 DIAGNOSIS — I509 Heart failure, unspecified: Secondary | ICD-10-CM | POA: Diagnosis not present

## 2015-11-27 DIAGNOSIS — R609 Edema, unspecified: Secondary | ICD-10-CM

## 2015-11-27 LAB — COMPREHENSIVE METABOLIC PANEL
ALK PHOS: 56 U/L (ref 33–130)
ALT: 4 U/L — ABNORMAL LOW (ref 6–29)
AST: 16 U/L (ref 10–35)
Albumin: 3 g/dL — ABNORMAL LOW (ref 3.6–5.1)
BUN: 14 mg/dL (ref 7–25)
CALCIUM: 7.9 mg/dL — AB (ref 8.6–10.4)
CHLORIDE: 102 mmol/L (ref 98–110)
CO2: 26 mmol/L (ref 20–31)
Creat: 0.95 mg/dL — ABNORMAL HIGH (ref 0.60–0.88)
GLUCOSE: 91 mg/dL (ref 65–99)
POTASSIUM: 3.5 mmol/L (ref 3.5–5.3)
Sodium: 140 mmol/L (ref 135–146)
Total Bilirubin: 0.5 mg/dL (ref 0.2–1.2)
Total Protein: 5.4 g/dL — ABNORMAL LOW (ref 6.1–8.1)

## 2015-11-27 MED ORDER — POTASSIUM CHLORIDE CRYS ER 10 MEQ PO TBCR: 10.0000 meq | EXTENDED_RELEASE_TABLET | Freq: Two times a day (BID) | ORAL | Status: DC

## 2015-11-27 MED ORDER — FUROSEMIDE 20 MG PO TABS: 20.0000 mg | ORAL_TABLET | Freq: Every day | ORAL | Status: DC

## 2015-11-27 NOTE — Progress Notes (Signed)
   Subjective:    Patient ID: Connie [name], female    DOB: [birth year], 82 y.o.   MRN: [id number]  HPI She is here for evaluation of a several week history of increasing difficulty with bilateral leg edema. Also recently she has had difficulty with acute blood loss and did have a blood transfusion recently. She is now scheduled to see hematology as well as a follow-up with GI over the next several weeks to a month. She has no chest pain, shortness of breath, PND, DOE or orthopnea. Review of her blood work does show normal renal function with low calcium and potassium. She did not bring her medications with her but the record shows that she might possibly be on Lasix.   Review of Systems     Objective:   Physical Exam Alert and in no distress. Cardiac exam shows regular rhythm without murmurs or gallops. Lungs are clear to auscultation. Lower extremity edema shows 3+ pitting edema with skin being shiny and taut       Assessment & Plan:  Peripheral edema - Plan: Brain natriuretic peptide, Comprehensive metabolic panel  Angiodysplasia of stomach and duodenum with hemorrhage she will call me back when she gets home to give me the medications she is on. I will definitely make sure she is on Lasix and wait on her blood work. She has not been on Lasix or Klor-Con. I will call both of these in and have her recheck with me next week.

## 2015-11-27 NOTE — Telephone Encounter (Signed)
Called patient and let her know and scheduled her for next Tues @ 11:00am.

## 2015-11-27 NOTE — Telephone Encounter (Signed)
Tell her that I called in the Lasix and a potassium pill and have her set up an appointment for follow-up for Tuesday

## 2015-11-27 NOTE — Telephone Encounter (Signed)
Patient called and the three medication bottles that she had at home were amlodipine, benazepril and clonidine. She did NOT have any lasix-needs called into [facility] [place].

## 2015-11-27 NOTE — Patient Instructions (Addendum)
Keep her legs elevated as much as possible and also use support stockings.call me with a list of your medications when you get home

## 2015-11-28 LAB — BRAIN NATRIURETIC PEPTIDE: Brain Natriuretic Peptide: 180.2 pg/mL — ABNORMAL HIGH (ref <100)

## 2015-12-02 ENCOUNTER — Ambulatory Visit (INDEPENDENT_AMBULATORY_CARE_PROVIDER_SITE_OTHER): Payer: Commercial Managed Care - HMO | Admitting: Family Medicine

## 2015-12-02 ENCOUNTER — Encounter: Payer: Self-pay | Admitting: Family Medicine

## 2015-12-02 VITALS — BP 190/70 | HR 75 | Ht 62.0 in | Wt 124.0 lb

## 2015-12-02 DIAGNOSIS — H269 Unspecified cataract: Secondary | ICD-10-CM | POA: Diagnosis not present

## 2015-12-02 DIAGNOSIS — D649 Anemia, unspecified: Secondary | ICD-10-CM

## 2015-12-02 DIAGNOSIS — E1136 Type 2 diabetes mellitus with diabetic cataract: Secondary | ICD-10-CM | POA: Insufficient documentation

## 2015-12-02 DIAGNOSIS — R609 Edema, unspecified: Secondary | ICD-10-CM

## 2015-12-02 NOTE — Progress Notes (Signed)
   Subjective:    Patient ID: Connie [name], female    DOB: April 27, 1934, 82 y.o.   MRN: [id number]  HPI She is here for recheck. She was started on last visit for treatment of the peripheral edema. Her BNP was 184 at that time she was having no chest pain, shortness of breath, PND or DOE. Presently she states the same. She does note an improvement in her leg swelling.   Review of Systems     Objective:   Physical Exam Alert and in no distress. Cardiac exam shows regular rhythm without murmurs or gallops. Lungs are clear to auscultation. Lower extremity exam through hose that she could not remove as they were pantyhose does show her cath size to be roughly half of what they were before. She has a 4 pound weight loss.      Assessment & Plan:  Symptomatic anemia  Peripheral edema she seems to be doing much better and is almost down to her regular weight. I will continue her on her present medication regimen and recheck here in several months. She might be an early failure but I don't think further evaluation at this time as needed. She is scheduled for cataract surgery in the near future.

## 2015-12-08 ENCOUNTER — Ambulatory Visit (HOSPITAL_BASED_OUTPATIENT_CLINIC_OR_DEPARTMENT_OTHER): Payer: Commercial Managed Care - HMO | Admitting: Hematology

## 2015-12-08 ENCOUNTER — Encounter: Payer: Self-pay | Admitting: Hematology

## 2015-12-08 ENCOUNTER — Telehealth: Payer: Self-pay | Admitting: Hematology

## 2015-12-08 ENCOUNTER — Ambulatory Visit (HOSPITAL_BASED_OUTPATIENT_CLINIC_OR_DEPARTMENT_OTHER): Payer: Commercial Managed Care - HMO

## 2015-12-08 VITALS — BP 197/52 | HR 85 | Temp 97.8°F | Resp 18 | Ht 61.0 in | Wt 127.5 lb

## 2015-12-08 DIAGNOSIS — K5521 Angiodysplasia of colon with hemorrhage: Secondary | ICD-10-CM | POA: Diagnosis not present

## 2015-12-08 DIAGNOSIS — K31811 Angiodysplasia of stomach and duodenum with bleeding: Secondary | ICD-10-CM

## 2015-12-08 DIAGNOSIS — K922 Gastrointestinal hemorrhage, unspecified: Secondary | ICD-10-CM | POA: Diagnosis not present

## 2015-12-08 DIAGNOSIS — E039 Hypothyroidism, unspecified: Secondary | ICD-10-CM | POA: Diagnosis not present

## 2015-12-08 DIAGNOSIS — D500 Iron deficiency anemia secondary to blood loss (chronic): Secondary | ICD-10-CM

## 2015-12-08 LAB — COMPREHENSIVE METABOLIC PANEL
ALBUMIN: 3.1 g/dL — AB (ref 3.5–5.0)
ALK PHOS: 65 U/L (ref 40–150)
ANION GAP: 8 meq/L (ref 3–11)
AST: 16 U/L (ref 5–34)
BILIRUBIN TOTAL: 0.53 mg/dL (ref 0.20–1.20)
BUN: 17.2 mg/dL (ref 7.0–26.0)
CO2: 28 mEq/L (ref 22–29)
CREATININE: 1 mg/dL (ref 0.6–1.1)
Calcium: 8.2 mg/dL — ABNORMAL LOW (ref 8.4–10.4)
Chloride: 107 mEq/L (ref 98–109)
EGFR: 55 mL/min/{1.73_m2} — AB (ref >90)
GLUCOSE: 118 mg/dL (ref 70–140)
Potassium: 3.8 mEq/L (ref 3.5–5.1)
SODIUM: 142 meq/L (ref 136–145)
Total Protein: 6.3 g/dL — ABNORMAL LOW (ref 6.4–8.3)

## 2015-12-08 LAB — CBC & DIFF AND RETIC
BASO%: 0.6 % (ref 0.0–2.0)
Basophils Absolute: 0 10*3/uL (ref 0.0–0.1)
EOS%: 4.1 % (ref 0.0–7.0)
Eosinophils Absolute: 0.2 10*3/uL (ref 0.0–0.5)
HCT: 28.9 % — ABNORMAL LOW (ref 34.8–46.6)
HGB: 8.8 g/dL — ABNORMAL LOW (ref 11.6–15.9)
Immature Retic Fract: 12.9 % — ABNORMAL HIGH (ref 1.60–10.00)
LYMPH%: 17.4 % (ref 14.0–49.7)
MCH: 26.4 pg (ref 25.1–34.0)
MCHC: 30.4 g/dL — AB (ref 31.5–36.0)
MCV: 86.8 fL (ref 79.5–101.0)
MONO#: 0.5 10*3/uL (ref 0.1–0.9)
MONO%: 9.4 % (ref 0.0–14.0)
NEUT#: 3.5 10*3/uL (ref 1.5–6.5)
NEUT%: 68.5 % (ref 38.4–76.8)
PLATELETS: 379 10*3/uL (ref 145–400)
RBC: 3.33 10*6/uL — ABNORMAL LOW (ref 3.70–5.45)
RDW: 17.8 % — ABNORMAL HIGH (ref 11.2–14.5)
Retic %: 1.35 % (ref 0.70–2.10)
Retic Ct Abs: 44.96 10*3/uL (ref 33.70–90.70)
WBC: 5.1 10*3/uL (ref 3.9–10.3)
lymph#: 0.9 10*3/uL (ref 0.9–3.3)

## 2015-12-08 LAB — FERRITIN: Ferritin: 18 ng/ml (ref 9–269)

## 2015-12-08 NOTE — Telephone Encounter (Signed)
Gave adn printd appt sched and avs for pt for March and April

## 2015-12-10 DIAGNOSIS — K31811 Angiodysplasia of stomach and duodenum with bleeding: Secondary | ICD-10-CM | POA: Diagnosis not present

## 2015-12-10 DIAGNOSIS — D500 Iron deficiency anemia secondary to blood loss (chronic): Secondary | ICD-10-CM | POA: Diagnosis not present

## 2015-12-15 ENCOUNTER — Ambulatory Visit (HOSPITAL_BASED_OUTPATIENT_CLINIC_OR_DEPARTMENT_OTHER): Payer: Commercial Managed Care - HMO

## 2015-12-15 VITALS — BP 166/54 | HR 85 | Temp 98.2°F | Resp 17

## 2015-12-15 DIAGNOSIS — D500 Iron deficiency anemia secondary to blood loss (chronic): Secondary | ICD-10-CM | POA: Diagnosis not present

## 2015-12-15 DIAGNOSIS — K5521 Angiodysplasia of colon with hemorrhage: Secondary | ICD-10-CM | POA: Diagnosis not present

## 2015-12-15 DIAGNOSIS — K922 Gastrointestinal hemorrhage, unspecified: Secondary | ICD-10-CM | POA: Diagnosis not present

## 2015-12-15 MED ORDER — SODIUM CHLORIDE 0.9 % IV SOLN
510.0000 mg | Freq: Once | INTRAVENOUS | Status: AC
  Administered 2015-12-15: 510 mg via INTRAVENOUS
  Filled 2015-12-15: qty 17

## 2015-12-15 MED ORDER — SODIUM CHLORIDE 0.9 % IV SOLN
Freq: Once | INTRAVENOUS | Status: AC
  Administered 2015-12-15: 15:00:00 via INTRAVENOUS

## 2015-12-15 NOTE — Progress Notes (Signed)
Pt tolerated first Feraheme infusion without any complications.  Pt observed for 30 minute post infusion period.  Vitals obtained and remain stable per previously charted vitals.  Pt verbalized her BP runs high and she is currently taking medication for BP, which she takes at night.  Pt discharged ambulatory without any questions or concerns accompanied by her husband.

## 2015-12-15 NOTE — Patient Instructions (Signed)

## 2015-12-16 ENCOUNTER — Encounter (HOSPITAL_COMMUNITY): Payer: Self-pay

## 2015-12-16 ENCOUNTER — Inpatient Hospital Stay (HOSPITAL_COMMUNITY): Admit: 2015-12-16 | Payer: Commercial Managed Care - HMO

## 2015-12-16 ENCOUNTER — Inpatient Hospital Stay (HOSPITAL_COMMUNITY)
Admission: EM | Admit: 2015-12-16 | Discharge: 2015-12-21 | DRG: 286 | Disposition: A | Discharge status: Home / Self Care | Payer: Commercial Managed Care - HMO | Attending: Internal Medicine | Admitting: Internal Medicine

## 2015-12-16 ENCOUNTER — Emergency Department (HOSPITAL_COMMUNITY): Payer: Commercial Managed Care - HMO

## 2015-12-16 ENCOUNTER — Inpatient Hospital Stay (HOSPITAL_COMMUNITY): Payer: Commercial Managed Care - HMO

## 2015-12-16 ENCOUNTER — Inpatient Hospital Stay (HOSPITAL_BASED_OUTPATIENT_CLINIC_OR_DEPARTMENT_OTHER)
Admit: 2015-12-16 | Discharge: 2015-12-16 | Disposition: A | Discharge status: Home / Self Care | Payer: Commercial Managed Care - HMO | Attending: Nurse Practitioner | Admitting: Nurse Practitioner

## 2015-12-16 ENCOUNTER — Other Ambulatory Visit: Payer: Self-pay

## 2015-12-16 ENCOUNTER — Telehealth: Payer: Self-pay | Admitting: Family Medicine

## 2015-12-16 DIAGNOSIS — E1165 Type 2 diabetes mellitus with hyperglycemia: Secondary | ICD-10-CM | POA: Diagnosis present

## 2015-12-16 DIAGNOSIS — I5021 Acute systolic (congestive) heart failure: Secondary | ICD-10-CM | POA: Diagnosis not present

## 2015-12-16 DIAGNOSIS — K31811 Angiodysplasia of stomach and duodenum with bleeding: Secondary | ICD-10-CM | POA: Diagnosis not present

## 2015-12-16 DIAGNOSIS — I509 Heart failure, unspecified: Secondary | ICD-10-CM

## 2015-12-16 DIAGNOSIS — M7989 Other specified soft tissue disorders: Secondary | ICD-10-CM | POA: Diagnosis not present

## 2015-12-16 DIAGNOSIS — I429 Cardiomyopathy, unspecified: Secondary | ICD-10-CM | POA: Diagnosis present

## 2015-12-16 DIAGNOSIS — R0600 Dyspnea, unspecified: Secondary | ICD-10-CM | POA: Diagnosis present

## 2015-12-16 DIAGNOSIS — I52 Other heart disorders in diseases classified elsewhere: Secondary | ICD-10-CM | POA: Diagnosis present

## 2015-12-16 DIAGNOSIS — E785 Hyperlipidemia, unspecified: Secondary | ICD-10-CM

## 2015-12-16 DIAGNOSIS — I110 Hypertensive heart disease with heart failure: Principal | ICD-10-CM | POA: Diagnosis present

## 2015-12-16 DIAGNOSIS — I10 Essential (primary) hypertension: Secondary | ICD-10-CM

## 2015-12-16 DIAGNOSIS — I2510 Atherosclerotic heart disease of native coronary artery without angina pectoris: Secondary | ICD-10-CM | POA: Diagnosis present

## 2015-12-16 DIAGNOSIS — Q2733 Arteriovenous malformation of digestive system vessel: Secondary | ICD-10-CM

## 2015-12-16 DIAGNOSIS — E1169 Type 2 diabetes mellitus with other specified complication: Secondary | ICD-10-CM | POA: Diagnosis present

## 2015-12-16 DIAGNOSIS — E039 Hypothyroidism, unspecified: Secondary | ICD-10-CM | POA: Diagnosis present

## 2015-12-16 DIAGNOSIS — R7989 Other specified abnormal findings of blood chemistry: Secondary | ICD-10-CM

## 2015-12-16 DIAGNOSIS — R0902 Hypoxemia: Secondary | ICD-10-CM

## 2015-12-16 DIAGNOSIS — I6789 Other cerebrovascular disease: Secondary | ICD-10-CM | POA: Diagnosis not present

## 2015-12-16 DIAGNOSIS — R0603 Acute respiratory distress: Secondary | ICD-10-CM | POA: Diagnosis present

## 2015-12-16 DIAGNOSIS — Z79899 Other long term (current) drug therapy: Secondary | ICD-10-CM | POA: Diagnosis not present

## 2015-12-16 DIAGNOSIS — I5023 Acute on chronic systolic (congestive) heart failure: Secondary | ICD-10-CM | POA: Diagnosis present

## 2015-12-16 DIAGNOSIS — R069 Unspecified abnormalities of breathing: Secondary | ICD-10-CM | POA: Diagnosis not present

## 2015-12-16 DIAGNOSIS — J9601 Acute respiratory failure with hypoxia: Secondary | ICD-10-CM

## 2015-12-16 DIAGNOSIS — J9621 Acute and chronic respiratory failure with hypoxia: Secondary | ICD-10-CM | POA: Diagnosis not present

## 2015-12-16 DIAGNOSIS — M1990 Unspecified osteoarthritis, unspecified site: Secondary | ICD-10-CM | POA: Diagnosis present

## 2015-12-16 DIAGNOSIS — J9811 Atelectasis: Secondary | ICD-10-CM | POA: Diagnosis not present

## 2015-12-16 DIAGNOSIS — Z96653 Presence of artificial knee joint, bilateral: Secondary | ICD-10-CM | POA: Diagnosis present

## 2015-12-16 DIAGNOSIS — R0602 Shortness of breath: Secondary | ICD-10-CM | POA: Diagnosis not present

## 2015-12-16 DIAGNOSIS — T502X5A Adverse effect of carbonic-anhydrase inhibitors, benzothiadiazides and other diuretics, initial encounter: Secondary | ICD-10-CM | POA: Diagnosis not present

## 2015-12-16 DIAGNOSIS — E1159 Type 2 diabetes mellitus with other circulatory complications: Secondary | ICD-10-CM | POA: Diagnosis present

## 2015-12-16 DIAGNOSIS — I248 Other forms of acute ischemic heart disease: Secondary | ICD-10-CM | POA: Diagnosis present

## 2015-12-16 DIAGNOSIS — I152 Hypertension secondary to endocrine disorders: Secondary | ICD-10-CM | POA: Diagnosis present

## 2015-12-16 DIAGNOSIS — I5041 Acute combined systolic (congestive) and diastolic (congestive) heart failure: Secondary | ICD-10-CM | POA: Diagnosis present

## 2015-12-16 DIAGNOSIS — D500 Iron deficiency anemia secondary to blood loss (chronic): Secondary | ICD-10-CM | POA: Diagnosis present

## 2015-12-16 DIAGNOSIS — N179 Acute kidney failure, unspecified: Secondary | ICD-10-CM | POA: Diagnosis present

## 2015-12-16 DIAGNOSIS — R778 Other specified abnormalities of plasma proteins: Secondary | ICD-10-CM | POA: Diagnosis present

## 2015-12-16 DIAGNOSIS — E7800 Pure hypercholesterolemia, unspecified: Secondary | ICD-10-CM | POA: Diagnosis present

## 2015-12-16 LAB — COMPREHENSIVE METABOLIC PANEL
ALBUMIN: 3 g/dL — AB (ref 3.5–5.0)
ALK PHOS: 66 U/L (ref 38–126)
ALT: 8 U/L — ABNORMAL LOW (ref 14–54)
ANION GAP: 10 (ref 5–15)
AST: 20 U/L (ref 15–41)
BILIRUBIN TOTAL: 1 mg/dL (ref 0.3–1.2)
BUN: 23 mg/dL — AB (ref 6–20)
CALCIUM: 8.5 mg/dL — AB (ref 8.9–10.3)
CO2: 23 mmol/L (ref 22–32)
CREATININE: 1.04 mg/dL — AB (ref 0.44–1.00)
Chloride: 108 mmol/L (ref 101–111)
GFR calc Af Amer: 57 mL/min — ABNORMAL LOW (ref >60)
GFR calc non Af Amer: 49 mL/min — ABNORMAL LOW (ref >60)
GLUCOSE: 272 mg/dL — AB (ref 65–99)
Potassium: 3.6 mmol/L (ref 3.5–5.1)
Sodium: 141 mmol/L (ref 135–145)
TOTAL PROTEIN: 6.2 g/dL — AB (ref 6.5–8.1)

## 2015-12-16 LAB — I-STAT ARTERIAL BLOOD GAS, ED
ACID-BASE DEFICIT: 2 mmol/L (ref 0.0–2.0)
Bicarbonate: 22.3 mEq/L (ref 20.0–24.0)
O2 SAT: 94 %
PCO2 ART: 32.9 mmHg — AB (ref 35.0–45.0)
Patient temperature: 98.6
TCO2: 23 mmol/L (ref 0–100)
pH, Arterial: 7.44 (ref 7.350–7.450)
pO2, Arterial: 68 mmHg — ABNORMAL LOW (ref 80.0–100.0)

## 2015-12-16 LAB — CBC WITH DIFFERENTIAL/PLATELET
BASOS PCT: 0 %
Basophils Absolute: 0 10*3/uL (ref 0.0–0.1)
EOS ABS: 0.1 10*3/uL (ref 0.0–0.7)
Eosinophils Relative: 1 %
HEMATOCRIT: 24.3 % — AB (ref 36.0–46.0)
Hemoglobin: 7.4 g/dL — ABNORMAL LOW (ref 12.0–15.0)
LYMPHS PCT: 11 %
Lymphs Abs: 1 10*3/uL (ref 0.7–4.0)
MCH: 26.1 pg (ref 26.0–34.0)
MCHC: 30.5 g/dL (ref 30.0–36.0)
MCV: 85.9 fL (ref 78.0–100.0)
MONO ABS: 0.4 10*3/uL (ref 0.1–1.0)
Monocytes Relative: 5 %
NEUTROS ABS: 7.4 10*3/uL (ref 1.7–7.7)
NEUTROS PCT: 83 %
Platelets: 326 10*3/uL (ref 150–400)
RBC: 2.83 MIL/uL — ABNORMAL LOW (ref 3.87–5.11)
RDW: 17.4 % — AB (ref 11.5–15.5)
WBC: 9 10*3/uL (ref 4.0–10.5)

## 2015-12-16 LAB — I-STAT TROPONIN, ED
Troponin i, poc: 0.03 ng/mL (ref 0.00–0.08)
Troponin i, poc: 0.1 ng/mL (ref 0.00–0.08)
Troponin i, poc: 0.12 ng/mL (ref 0.00–0.08)

## 2015-12-16 LAB — I-STAT CG4 LACTIC ACID, ED
Lactic Acid, Venous: 1.42 mmol/L (ref 0.5–2.0)
Lactic Acid, Venous: 3.22 mmol/L (ref 0.5–2.0)

## 2015-12-16 LAB — BASIC METABOLIC PANEL
ANION GAP: 12 (ref 5–15)
BUN: 24 mg/dL — ABNORMAL HIGH (ref 6–20)
CO2: 22 mmol/L (ref 22–32)
Calcium: 8.8 mg/dL — ABNORMAL LOW (ref 8.9–10.3)
Chloride: 108 mmol/L (ref 101–111)
Creatinine, Ser: 1.22 mg/dL — ABNORMAL HIGH (ref 0.44–1.00)
GFR calc non Af Amer: 41 mL/min — ABNORMAL LOW (ref >60)
GFR, EST AFRICAN AMERICAN: 47 mL/min — AB (ref >60)
Glucose, Bld: 258 mg/dL — ABNORMAL HIGH (ref 65–99)
POTASSIUM: 3.1 mmol/L — AB (ref 3.5–5.1)
SODIUM: 142 mmol/L (ref 135–145)

## 2015-12-16 LAB — LACTIC ACID, PLASMA
LACTIC ACID, VENOUS: 4.3 mmol/L — AB (ref 0.5–2.0)
Lactic Acid, Venous: 3.4 mmol/L (ref 0.5–2.0)

## 2015-12-16 LAB — CBG MONITORING, ED
Glucose-Capillary: 291 mg/dL — ABNORMAL HIGH (ref 65–99)
Glucose-Capillary: 166 mg/dL — ABNORMAL HIGH (ref 65–99)

## 2015-12-16 LAB — D-DIMER, QUANTITATIVE: D-Dimer, Quant: 2.17 ug/mL-FEU — ABNORMAL HIGH (ref 0.00–0.50)

## 2015-12-16 LAB — PROCALCITONIN: PROCALCITONIN: 0.72 ng/mL

## 2015-12-16 LAB — TROPONIN I
TROPONIN I: 0.17 ng/mL — AB (ref <0.031)
TROPONIN I: 0.15 ng/mL — AB (ref <0.031)

## 2015-12-16 LAB — BRAIN NATRIURETIC PEPTIDE: B Natriuretic Peptide: 330 pg/mL — ABNORMAL HIGH (ref 0.0–100.0)

## 2015-12-16 MED ORDER — IPRATROPIUM BROMIDE 0.02 % IN SOLN
0.5000 mg | RESPIRATORY_TRACT | Status: AC
  Administered 2015-12-16 (×3): 0.5 mg via RESPIRATORY_TRACT
  Filled 2015-12-16 (×2): qty 2.5

## 2015-12-16 MED ORDER — FUROSEMIDE 10 MG/ML IJ SOLN
20.0000 mg | Freq: Once | INTRAMUSCULAR | Status: AC
  Administered 2015-12-16: 20 mg via INTRAVENOUS
  Filled 2015-12-16: qty 2

## 2015-12-16 MED ORDER — MAGNESIUM SULFATE 2 GM/50ML IV SOLN
2.0000 g | Freq: Once | INTRAVENOUS | Status: AC
  Administered 2015-12-16: 2 g via INTRAVENOUS
  Filled 2015-12-16: qty 50

## 2015-12-16 MED ORDER — ATORVASTATIN CALCIUM 40 MG PO TABS
40.0000 mg | ORAL_TABLET | Freq: Every day | ORAL | Status: DC
  Administered 2015-12-16 – 2015-12-20 (×5): 40 mg via ORAL
  Filled 2015-12-16 (×5): qty 1

## 2015-12-16 MED ORDER — ENOXAPARIN SODIUM 30 MG/0.3ML ~~LOC~~ SOLN
30.0000 mg | Freq: Every day | SUBCUTANEOUS | Status: DC
  Administered 2015-12-16 – 2015-12-20 (×5): 30 mg via SUBCUTANEOUS
  Filled 2015-12-16 (×5): qty 0.3

## 2015-12-16 MED ORDER — SODIUM CHLORIDE 0.9 % IV SOLN
INTRAVENOUS | Status: DC
  Administered 2015-12-16 – 2015-12-17 (×2): via INTRAVENOUS

## 2015-12-16 MED ORDER — SODIUM CHLORIDE 0.9 % IV BOLUS (SEPSIS)
500.0000 mL | Freq: Once | INTRAVENOUS | Status: AC
  Administered 2015-12-16: 500 mL via INTRAVENOUS

## 2015-12-16 MED ORDER — INSULIN ASPART 100 UNIT/ML ~~LOC~~ SOLN: 0.0000 [IU] | Freq: Every day | SUBCUTANEOUS | Status: DC

## 2015-12-16 MED ORDER — SODIUM CHLORIDE 0.9 % IV SOLN: 250.0000 mL | INTRAVENOUS | Status: DC | PRN

## 2015-12-16 MED ORDER — TRAZODONE HCL 50 MG PO TABS
50.0000 mg | ORAL_TABLET | Freq: Every day | ORAL | Status: DC
Start: 2015-12-16 — End: 2015-12-21
  Administered 2015-12-16 – 2015-12-20 (×5): 50 mg via ORAL
  Filled 2015-12-16 (×5): qty 1

## 2015-12-16 MED ORDER — ALBUTEROL (5 MG/ML) CONTINUOUS INHALATION SOLN
10.0000 mg/h | INHALATION_SOLUTION | Freq: Once | RESPIRATORY_TRACT | Status: AC
  Administered 2015-12-16: 10 mg/h via RESPIRATORY_TRACT
  Filled 2015-12-16: qty 20

## 2015-12-16 MED ORDER — HYDRALAZINE HCL 20 MG/ML IJ SOLN
10.0000 mg | Freq: Once | INTRAMUSCULAR | Status: DC
  Filled 2015-12-16: qty 1

## 2015-12-16 MED ORDER — LEVOTHYROXINE SODIUM 25 MCG PO TABS
125.0000 ug | ORAL_TABLET | Freq: Every day | ORAL | Status: DC
  Administered 2015-12-18 – 2015-12-21 (×4): 125 ug via ORAL
  Filled 2015-12-16 (×5): qty 1

## 2015-12-16 MED ORDER — HYDRALAZINE HCL 20 MG/ML IJ SOLN: 10.0000 mg | Freq: Four times a day (QID) | INTRAMUSCULAR | Status: DC | PRN

## 2015-12-16 MED ORDER — SODIUM CHLORIDE 0.9% FLUSH
3.0000 mL | Freq: Two times a day (BID) | INTRAVENOUS | Status: DC
  Administered 2015-12-17 – 2015-12-20 (×7): 3 mL via INTRAVENOUS

## 2015-12-16 MED ORDER — PANTOPRAZOLE SODIUM 20 MG PO TBEC
20.0000 mg | DELAYED_RELEASE_TABLET | Freq: Every day | ORAL | Status: DC
  Administered 2015-12-18 – 2015-12-21 (×4): 20 mg via ORAL
  Filled 2015-12-16 (×8): qty 1

## 2015-12-16 MED ORDER — POTASSIUM CHLORIDE CRYS ER 10 MEQ PO TBCR
10.0000 meq | EXTENDED_RELEASE_TABLET | Freq: Two times a day (BID) | ORAL | Status: DC
  Administered 2015-12-16: 10 meq via ORAL
  Filled 2015-12-16 (×2): qty 1

## 2015-12-16 MED ORDER — SODIUM CHLORIDE 0.9% FLUSH: 3.0000 mL | INTRAVENOUS | Status: DC | PRN

## 2015-12-16 MED ORDER — ONDANSETRON HCL 4 MG/2ML IJ SOLN: 4.0000 mg | Freq: Four times a day (QID) | INTRAMUSCULAR | Status: DC | PRN

## 2015-12-16 MED ORDER — ACETAMINOPHEN 325 MG PO TABS: 650.0000 mg | ORAL_TABLET | ORAL | Status: DC | PRN

## 2015-12-16 MED ORDER — INSULIN ASPART 100 UNIT/ML ~~LOC~~ SOLN
0.0000 [IU] | Freq: Three times a day (TID) | SUBCUTANEOUS | Status: DC
  Administered 2015-12-16: 5 [IU] via SUBCUTANEOUS
  Administered 2015-12-21: 1 [IU] via SUBCUTANEOUS
  Filled 2015-12-16: qty 1

## 2015-12-16 NOTE — Progress Notes (Addendum)
Persistent tachycardia- await completion of venous duplex to determine if has DVT and thereby may have PE. Recent h/o ABL anemia due to GI source so will not empirically begin full dose anticoagulation but will continue DVT prophylaxis Lovenox.  342pm: NO DVT noted on preliminary venous duplex  535pm: Patient remains tachycardic and becomes quite tachypnea with any activity-lactic acid has increased to 4.3-patient has had minimal urinary output-we'll check an ABG and a noncontrasted CT of the chest to rule out possible atypical presentation of pneumonia (she is afebrile)-also check influenza PCR-check urinalysis and culture as well as blood cultures-she remains normotensive but blood pressure has been steadily decreasing-echo has not been completed but pending above results may need to discontinue Lasix-we'll also check Procalcitonin to see a full help clarify source of respiratory symptoms and acidosis  551pm: Discussed with attending physician and plan is to discontinue Lasix and rehydrate. Although patient does have elevated lactate she does not have any other clinical findings consistent with sepsis although we are ruling out infectious process as above. We'll give normal saline IV 500 mL bolus 1 and begin normal saline at 125 mL per hour. Increasing lactate may simply reflect volume depletion noting BUN/Cr have increased slightly since Lasix given.  Erin Hearing, ANP

## 2015-12-16 NOTE — Progress Notes (Signed)
Pt arrived to 3S08 via stretcher from ED.  Pt able to scoot herself from stretcher to bed with noticeable increase in respiratory effort.  Pt had to take multiple pauses to catch her breath.  Pt remained on 3L [place].  CCMD and Elink notified of patient's arrival.  Pt is alert and calm.  Son is present.  Belongings placed in closet.  Will continue to monitor patient.

## 2015-12-16 NOTE — ED Notes (Signed)
Voids 200cc to bedpan

## 2015-12-16 NOTE — H&P (Signed)
[facility] History and Physical                                                                                    Connie [name], is a 82 y.o. female  MRN: [id number]   DOB - 1934/03/05  Admit Date - 12/16/2015  Outpatient Primary MD for the patient is Wyatt Haste, MD  Referring MD: Billy Fischer / ER  PMH: Past Medical History  Diagnosis Date  . Hypertension   . Obesity   . Allergy     RHINITIS  . Hypothyroidism   . Hypercholesteremia   . Bleeding gastric ulcer     hx/notes 11/21/2015  . Angiodysplasia of stomach     hx/notes 11/21/2015  . Angiodysplasia of duodenum     hx/notes 11/21/2015  . History of blood transfusion 01/2015; 06/2015; 11/21/2015  . Type II diabetes mellitus ([place])   . Pneumonia "several times"  . Chronic blood loss anemia     secondary to angiodysplasia of the stomach and duodenum as well as history of bleeding gastric ulcer hx/notes 11/21/2015  . Arthritis     "joints ache"      PSH: Past Surgical History  Procedure Laterality Date  . Total thyroidectomy  ~ 1966  . Shoulder open rotator cuff repair Right 12/2004    open subacromial decompression, distal clavicle  resection, rotator cuff repair/notes 02/02/2011  . Colonoscopy N/A 08/30/2014    Procedure: COLONOSCOPY;  Surgeon: Beryle Beams, MD;  Location: [facility] ENDOSCOPY;  Service: Endoscopy;  Laterality: N/A;  . Hot hemostasis N/A 08/30/2014    Procedure: HOT HEMOSTASIS (ARGON PLASMA COAGULATION/BICAP);  Surgeon: Beryle Beams, MD;  Location: [facility];  Service: Endoscopy;  Laterality: N/A;  . Colonoscopy N/A 02/21/2015    Procedure: COLONOSCOPY;  Surgeon: Carol Ada, MD;  Location: [facility]COPY;  Service: Endoscopy;  Laterality: N/A;  . Givens capsule study N/A 05/15/2015    Procedure: GIVENS CAPSULE STUDY;  Surgeon: Carol Ada, MD;  Location: [place];  Service: Endoscopy;  Laterality: N/A;  . Enteroscopy N/A 06/06/2015    Procedure: ENTEROSCOPY;  Surgeon: Carol Ada, MD;   Location: [facility] ENDOSCOPY;  Service: Endoscopy;  Laterality: N/A;  . Hot hemostasis N/A 06/06/2015    Procedure: HOT HEMOSTASIS (ARGON PLASMA COAGULATION/BICAP);  Surgeon: Carol Ada, MD;  Location: [facility];  Service: Endoscopy;  Laterality: N/A;  . Tonsillectomy  1940s  . Joint replacement    . Total knee arthroplasty Bilateral ~ 2002  . Revision total knee arthroplasty Bilateral 2004-2015  . Fracture surgery       CC:  Chief Complaint  Patient presents with  . Respiratory Distress     HPI: 82 year old female patient with known chronic lower extremity edema, hypothyroidism and diabetes on Actos, dyslipidemia. She also is a history of angiodysplasia of the stomach and duodenum followed by Dr. Benson Norway. She was recently discharged on the/4/17 after admission for symptomatic anemia felt to be related to GI source. She was evaluated by hematology without significant iron deficiency and was subsequently set up to undergo IV iron infusion after discharge. She was recently seen by her primary care physician on 3/14 and her peripheral edema was  found to be stable and her weight was near her baseline so no new medications or treatments were added. Patient reports that with her last episode of anemia she presented with shortness of breath and lower extremity edema. She had been doing well since discharge and presented for the iron infusion yesterday on 3/27. He reports that she was somewhat apprehensive about receiving the iron treatment and it was documented by the nursing staff that the patient was somewhat hypertensive prior to transfusion. Patient reports that she did receive a large volume of fluid with the iron infusion. She had no allergic type symptoms with infusion of the iron and did well and was discharged from short stay. Abruptly last night patient began noticing that she was not comfortable while trying to sleep and could not lay flat. At some point she fell asleep but awakened with significant  shortness of breath after walking to the bathroom and significant orthopnea. She went outside and thought this helped her breathing. Her husband noted she was still having significant issues breathing. Other family members recall to the home and eventually EMS was called. She has not had any constitutional symptoms such as fevers chills or productive cough. She does not have a history of asthma nor prior tobacco abuse.  ER Evaluation and treatment: Initial blood pressure was 176/67-pulse 111-respirations 24-no documentation as to whether patient was hypoxemic but patient was placed on 4 L nasal cannula oxygen with 100% saturations Repeat vital signs BP 147/51-pulse 124-respirations 20-patient has been increased to 8 L oxygen with 94% saturations although has been weaned off of a previous Ventimask (patient reports). EKG: Sinus tachycardia with ventricular rate 107 bpm, QTC 458 ms, no acute ischemic changes. PCXR: Changes consistent with congestive heart failure/pulmonary edema Lab data: Na 141, K 3.6, BUN 23, creatinine 1.04, glucose 272, BNP 330, troponin 0.03, lactic acid 1.42, WBCs 9000 with elevated neutrophils but normal absolute neutrophils, hemoglobin 7.4, d-dimer 2.17, type and screen completed an ER  Review of Systems   In addition to the HPI above,  No Fever-chills, myalgias or other constitutional symptoms No Headache, changes with Vision or hearing, new weakness, tingling, numbness in any extremity, No problems swallowing food or Liquids, indigestion/reflux No Chest pain, Cough, palpitations No Abdominal pain, N/V; no melena or hematochezia, no dark tarry stools No dysuria, hematuria or flank pain No new skin rashes, lesions, masses or bruises, No new joints pains-aches No recent weight gain or loss No polyuria, polydypsia or polyphagia,  *A full 10 point Review of Systems was done, except as stated above, all other Review of Systems were negative.  Social History Social History    Substance Use Topics  . Smoking status: Never Smoker   . Smokeless tobacco: Never Used  . Alcohol Use: No    Resides at: Private residence  Lives with: Spouse  Ambulatory status: Occasionally with a cane secondary to history of prior knee replacement surgery   Family History Family History  Problem Relation Age of Onset  . Anemia Sister   . Asthma Mother   . Ulcers Father      Prior to Admission medications   Medication Sig Start Date End Date Taking? Authorizing Provider  atorvastatin (LIPITOR) 40 MG tablet TAKE ONE TABLET BY MOUTH ONCE DAILY 09/01/15   [name], MD  furosemide (LASIX) 20 MG tablet Take 1 tablet (20 mg total) by mouth daily. 11/27/15   [name], MD  levothyroxine (SYNTHROID, LEVOTHROID) 125 MCG tablet TAKE ONE TABLET BY  MOUTH EVERY DAY 10/28/14   [name], MD  pantoprazole (PROTONIX) 20 MG tablet Take 1 tablet (20 mg total) by mouth daily. 11/22/15   Charlynne Cousins, MD  pioglitazone (ACTOS) 30 MG tablet Take 1 tablet (30 mg total) by mouth daily. 07/24/15   [name], MD  potassium chloride (K-DUR,KLOR-CON) 10 MEQ tablet Take 1 tablet (10 mEq total) by mouth 2 (two) times daily. 11/27/15   [name], MD  traZODone (DESYREL) 50 MG tablet Half tablet at night as needed for sleep 11/20/15   [name], MD    No Known Allergies  Physical Exam  Vitals  Blood pressure 147/51, pulse 125, resp. rate 20, height 4\' 11"  (1.499 m), weight 125 lb (56.7 kg), SpO2 94 %.   General:  In mild acute distress as evidenced by ongoing orthopnea and anxiety, otherwise appears healthy and well nourished  Psych:  Normal affect although mildly anxious, Denies Suicidal or Homicidal ideations, Awake Alert, Oriented X 3. Speech and thought patterns are clear and appropriate, no apparent short term memory deficits  Neuro:   No focal neurological deficits, CN II through XII intact, Strength 5/5 all 4 extremities, Sensation intact all 4 extremities.  ENT:   Ears and Eyes appear Normal, Conjunctivae clear, PER. Moist oral mucosa without erythema or exudates.  Neck:  Supple, No lymphadenopathy appreciated  Respiratory:  Symmetrical chest wall movement, Good air movement bilaterally, bilateral crackles diffusely on posterior auscultation. 6 L nasal cannula oxygen  Cardiac:  RRR, No Murmurs, 2+ bilateral LE edema noted, no JVD, No carotid bruits, peripheral pulses palpable at 2+  Abdomen:  Positive bowel sounds, Soft, Non tender, Non distended,  No masses appreciated, no obvious hepatosplenomegaly  Skin:  No Cyanosis, Normal Skin Turgor, No Skin Rash or Bruise.  Extremities: Symmetrical without obvious trauma or injury,  no effusions.  Data Review  CBC  Recent Labs Lab 12/16/15 0835  WBC 9.0  HGB 7.4*  HCT 24.3*  PLT 326  MCV 85.9  MCH 26.1  MCHC 30.5  RDW 17.4*  LYMPHSABS 1.0  MONOABS 0.4  EOSABS 0.1  BASOSABS 0.0    Chemistries   Recent Labs Lab 12/16/15 0835  NA 141  K 3.6  CL 108  CO2 23  GLUCOSE 272*  BUN 23*  CREATININE 1.04*  CALCIUM 8.5*  AST 20  ALT 8*  ALKPHOS 66  BILITOT 1.0    estimated creatinine clearance is 33.1 mL/min (by C-G formula based on Cr of 1.04).  No results for input(s): TSH, T4TOTAL, T3FREE, THYROIDAB in the last 72 hours.  Invalid input(s): FREET3  Coagulation profile No results for input(s): INR, PROTIME in the last 168 hours.   Recent Labs  12/16/15 0835  DDIMER 2.17*    Cardiac Enzymes No results for input(s): CKMB, TROPONINI, MYOGLOBIN in the last 168 hours.  Invalid input(s): CK  Invalid input(s): POCBNP  Urinalysis    Component Value Date/Time   COLORURINE YELLOW 10/10/2015 2254   APPEARANCEUR CLOUDY* 10/10/2015 2254   LABSPEC 1.017 10/10/2015 2254   PHURINE 5.0 10/10/2015 2254   GLUCOSEU NEGATIVE 10/10/2015 2254   HGBUR NEGATIVE 10/10/2015 2254   BILIRUBINUR MODERATE* 10/10/2015 2254   BILIRUBINUR n 05/09/2015 1656   KETONESUR 15* 10/10/2015 2254    PROTEINUR NEGATIVE 10/10/2015 2254   PROTEINUR n 05/09/2015 1656   UROBILINOGEN negative 05/09/2015 1656   UROBILINOGEN 0.2 02/19/2015 1823   NITRITE NEGATIVE 10/10/2015 2254   NITRITE n 05/09/2015 1656  LEUKOCYTESUR SMALL* 10/10/2015 2254    Imaging results:   Dg Chest Portable 1 View  12/16/2015  CLINICAL DATA:  Shortness of Breath EXAM: PORTABLE CHEST 1 VIEW COMPARISON:  October 10, 2015 FINDINGS: There is generalized interstitial edema with patchy alveolar edema in the bases as well. There are small pleural effusions bilaterally. There is cardiomegaly with pulmonary venous hypertension. No adenopathy evident. There is atherosclerotic calcification in the aorta. There is postoperative change in the right shoulder. IMPRESSION: Changes consistent with congestive heart failure. Electronically Signed   By: Lowella Grip III M.D.   On: 12/16/2015 09:08     EKG: (Independently reviewed)  Sinus tachycardia with ventricular rate 107 bpm, QTC 458 ms, no acute ischemic changes.   Assessment & Plan  Principal Problem:  Acute respiratory distress / Acute congestive heart failure  -Acute respiratory failure with suspected hypoxemia and findings with congestive heart failure/edema on exam. Suspect combination of volume overload after RN exam with hypertension contributing to either new CHF diagnosis versus flash pulmonary edema -Step down/Obs -Continue supportive care with oxygen -Was given Lasix 20 mg IV 1 in the ER-we'll give an additional 20 and then begin Lasix 40 IV every 12 hours for 2 more doses -Daily weights and strict I/O -Since unknown if actually has heart failure we'll check echo first before initiating medical therapies i.e. beta blocker and/or ACE inhibitor; also has not acute kidney injury so would not initiate ACE inhibitor at this juncture  ** 2nd lactate has increased to 3.22 in the absence of infectious signs  Active Problems:   AKI  -Baseline renal function BUN 14 and  creatinine 0.95 with current BUN 23 and creatinine 1.04 -Potentially related to heart failure issues and poor perfusion. I'll closely with diuresis    Hypertension associated with diabetes  -Was not on antihypertensive agents prior to admission -See above regarding echocardiogram prior to initiating medical therapy -When necessary hydralazine    Angiodysplasia of stomach and duodenum with hemorrhage -Patient currently denying symptoms of active bleeding and has recently followed up with her gastroenterologist -BUN elevated continue to monitor closely -Continue PPI    Iron deficiency anemia secondary to blood loss (chronic) -Received dose of IV iron yesterday on 3/27 -Hemoglobin has drifted down to below 8 since discharge but this could be related to volume overload/dilution -Repeat CBC in a.m. after diuresis    Hypothyroidism -Continue Synthroid -TSH was 0.47 to January 2017    Hyperlipidemia associated with type 2 diabetes mellitus -Continue statin    Diabetes currently uncontrolled -CBG 272 at presentation and suspect related to acute respiratory distress -In setting of possible CHF have held Actos at presentation -Follow CBGs and provide SSI -Hemoglobin A1c was 6. 09/25/2015    DVT Prophylaxis: We'll cautiously utilize Lovenox  Family Communication:   Husband and son at bedside  Code Status:  Full code  Condition:  Guarded  Discharge disposition: Dissipated discharged back to previous home environment once medically stable  Time spent in minutes : 60      Shelitha Magley L. ANP on 12/16/2015 at 10:59 AM  You may contact me by going to [email or web address] - password [email or web address]  I am available from 7a-7p but please confirm I am on the schedule by going to Amion as above.   After 7p please contact night coverage person covering me after hours  [facility]

## 2015-12-16 NOTE — ED Notes (Signed)
Pt and family made aware of bed assignment 

## 2015-12-16 NOTE — Telephone Encounter (Signed)
Pt son called as pt wanted you to know she was in [facility] again today.  She had iron treatment yesterday and couldn't breathe this morning.

## 2015-12-16 NOTE — ED Provider Notes (Signed)
CSN: [id number]     Arrival date & time 12/16/15  0813 History   First MD Initiated Contact with Patient 12/16/15 0818     Chief Complaint  Patient presents with  . Respiratory Distress     (Consider location/radiation/quality/duration/timing/severity/associated sxs/prior Treatment) Patient is a 82 y.o. female presenting with shortness of breath.  Shortness of Breath Severity:  Severe Onset quality:  Sudden Duration:  1 day Timing:  Constant Progression:  Worsening Chronicity:  New Context comment:  Had iron infusion yesterday afternoon Associated symptoms: cough and wheezing   Associated symptoms: no abdominal pain, no chest pain, no fever, no headaches, no neck pain, no rash, no sore throat and no vomiting   Risk factors: no hx of PE/DVT and no tobacco use     Past Medical History  Diagnosis Date  . Hypertension   . Obesity   . Allergy     RHINITIS  . Hypothyroidism   . Hypercholesteremia   . Bleeding gastric ulcer     hx/notes 11/21/2015  . Angiodysplasia of stomach     hx/notes 11/21/2015  . Angiodysplasia of duodenum     hx/notes 11/21/2015  . History of blood transfusion 01/2015; 06/2015; 11/21/2015  . Type II diabetes mellitus ([place])   . Pneumonia "several times"  . Chronic blood loss anemia     secondary to angiodysplasia of the stomach and duodenum as well as history of bleeding gastric ulcer hx/notes 11/21/2015  . Arthritis     "joints ache"   Past Surgical History  Procedure Laterality Date  . Total thyroidectomy  ~ 1966  . Shoulder open rotator cuff repair Right 12/2004    open subacromial decompression, distal clavicle  resection, rotator cuff repair/notes 02/02/2011  . Colonoscopy N/A 08/30/2014    Procedure: COLONOSCOPY;  Surgeon: Beryle Beams, MD;  Location: [facility] ENDOSCOPY;  Service: Endoscopy;  Laterality: N/A;  . Hot hemostasis N/A 08/30/2014    Procedure: HOT HEMOSTASIS (ARGON PLASMA COAGULATION/BICAP);  Surgeon: Beryle Beams, MD;  Location: [facility];   Service: Endoscopy;  Laterality: N/A;  . Colonoscopy N/A 02/21/2015    Procedure: COLONOSCOPY;  Surgeon: Carol Ada, MD;  Location: [facility] ENDOSCOPY;  Service: Endoscopy;  Laterality: N/A;  . Givens capsule study N/A 05/15/2015    Procedure: GIVENS CAPSULE STUDY;  Surgeon: Carol Ada, MD;  Location: [facility];  Service: Endoscopy;  Laterality: N/A;  . Enteroscopy N/A 06/06/2015    Procedure: ENTEROSCOPY;  Surgeon: Carol Ada, MD;  Location: [facility] ENDOSCOPY;  Service: Endoscopy;  Laterality: N/A;  . Hot hemostasis N/A 06/06/2015    Procedure: HOT HEMOSTASIS (ARGON PLASMA COAGULATION/BICAP);  Surgeon: Carol Ada, MD;  Location: [facility];  Service: Endoscopy;  Laterality: N/A;  . Tonsillectomy  1940s  . Joint replacement    . Total knee arthroplasty Bilateral ~ 2002  . Revision total knee arthroplasty Bilateral 2004-2015  . Fracture surgery     Family History  Problem Relation Age of Onset  . Anemia Sister   . Asthma Mother   . Ulcers Father    Social History  Substance Use Topics  . Smoking status: Never Smoker   . Smokeless tobacco: Never Used  . Alcohol Use: No   OB History    No data available     Review of Systems  Constitutional: Negative for fever.  HENT: Negative for congestion and sore throat.   Eyes: Negative for visual disturbance.  Respiratory: Positive for cough, shortness of breath and wheezing.   Cardiovascular: Negative for chest  pain.  Gastrointestinal: Negative for nausea, vomiting, abdominal pain and diarrhea.  Genitourinary: Negative for difficulty urinating.  Musculoskeletal: Negative for back pain and neck pain.  Skin: Negative for rash.  Neurological: Negative for syncope and headaches.      Allergies  Review of patient's allergies indicates no known allergies.  Home Medications   Prior to Admission medications   Medication Sig Start Date End Date Taking? Authorizing Provider  atorvastatin (LIPITOR) 40 MG tablet TAKE ONE TABLET BY MOUTH ONCE  DAILY 09/01/15  Yes [name], MD  furosemide (LASIX) 20 MG tablet Take 1 tablet (20 mg total) by mouth daily. 11/27/15  Yes [name], MD  levothyroxine (SYNTHROID, LEVOTHROID) 125 MCG tablet TAKE ONE TABLET BY MOUTH EVERY DAY 10/28/14  Yes [name], MD  pantoprazole (PROTONIX) 20 MG tablet Take 1 tablet (20 mg total) by mouth daily. 11/22/15  Yes Charlynne Cousins, MD  pioglitazone (ACTOS) 30 MG tablet Take 1 tablet (30 mg total) by mouth daily. 07/24/15  Yes [name], MD  potassium chloride (K-DUR,KLOR-CON) 10 MEQ tablet Take 1 tablet (10 mEq total) by mouth 2 (two) times daily. 11/27/15  Yes [name], MD  traZODone (DESYREL) 50 MG tablet Half tablet at night as needed for sleep Patient taking differently: Take 25 mg by mouth at bedtime as needed for sleep. Half tablet at night as needed for sleep 11/20/15  Yes [name], MD   BP 128/54 mmHg  Pulse 110  Temp(Src) 98.3 F (36.8 C) (Oral)  Resp 20  Ht 4\' 11"  (1.499 m)  Wt 125 lb (56.7 kg)  BMI 25.23 kg/m2  SpO2 97% Physical Exam  Constitutional: She is oriented to person, place, and time. She appears well-developed and well-nourished. No distress.  HENT:  Head: Normocephalic and atraumatic.  Eyes: Conjunctivae and EOM are normal.  Neck: Normal range of motion. JVD present.  Cardiovascular: Normal rate, regular rhythm, normal heart sounds and intact distal pulses.  Exam reveals no gallop and no friction rub.   No murmur heard. Pulmonary/Chest: She is in respiratory distress (tachypnea). She has wheezes (diffuse). She has no rales.  Abdominal: Soft. She exhibits no distension. There is no tenderness. There is no guarding.  Musculoskeletal: She exhibits edema (2+RLE, 3+ LLE). She exhibits no tenderness.  Neurological: She is alert and oriented to person, place, and time.  Skin: Skin is warm and dry. No rash noted. She is not diaphoretic. No erythema.  Nursing note and vitals reviewed.   ED Course  Procedures  (including critical care time) Labs Review Labs Reviewed  CBC WITH DIFFERENTIAL/PLATELET - Abnormal; Notable for the following:    RBC 2.83 (*)    Hemoglobin 7.4 (*)    HCT 24.3 (*)    RDW 17.4 (*)    All other components within normal limits  COMPREHENSIVE METABOLIC PANEL - Abnormal; Notable for the following:    Glucose, Bld 272 (*)    BUN 23 (*)    Creatinine, Ser 1.04 (*)    Calcium 8.5 (*)    Total Protein 6.2 (*)    Albumin 3.0 (*)    ALT 8 (*)    GFR calc non Af Amer 49 (*)    GFR calc Af Amer 57 (*)    All other components within normal limits  BRAIN NATRIURETIC PEPTIDE - Abnormal; Notable for the following:    B Natriuretic Peptide 330.0 (*)    All other components within normal limits  D-DIMER, QUANTITATIVE (NOT AT [facility]) - Abnormal; Notable for the following:    D-Dimer, Quant 2.17 (*)    All other components within normal limits  TROPONIN I - Abnormal; Notable for the following:    Troponin I 0.17 (*)    All other components within normal limits  LACTIC ACID, PLASMA - Abnormal; Notable for the following:    Lactic Acid, Venous 4.3 (*)    All other components within normal limits  BASIC METABOLIC PANEL - Abnormal; Notable for the following:    Potassium 3.1 (*)    Glucose, Bld 258 (*)    BUN 24 (*)    Creatinine, Ser 1.22 (*)    Calcium 8.8 (*)    GFR calc non Af Amer 41 (*)    GFR calc Af Amer 47 (*)    All other components within normal limits  I-STAT CG4 LACTIC ACID, ED - Abnormal; Notable for the following:    Lactic Acid, Venous 3.22 (*)    All other components within normal limits  I-STAT TROPOININ, ED - Abnormal; Notable for the following:    Troponin i, poc 0.10 (*)    All other components within normal limits  CBG MONITORING, ED - Abnormal; Notable for the following:    Glucose-Capillary 291 (*)    All other components within normal limits  I-STAT TROPOININ, ED - Abnormal; Notable for the following:    Troponin i, poc 0.12 (*)    All other  components within normal limits  URINE CULTURE  CULTURE, BLOOD (ROUTINE X 2)  CULTURE, BLOOD (ROUTINE X 2)  HEMOGLOBIN A1C  TROPONIN I  LACTIC ACID, PLASMA  TROPONIN I  BLOOD GAS, ARTERIAL  URINALYSIS, ROUTINE W REFLEX MICROSCOPIC (NOT AT [facility])  PROCALCITONIN  INFLUENZA PANEL BY PCR (TYPE A & B, H1N1)  I-STAT CG4 LACTIC ACID, ED  I-STAT TROPOININ, ED  TYPE AND SCREEN    Imaging Review Dg Chest Portable 1 View  12/16/2015  CLINICAL DATA:  Shortness of Breath EXAM: PORTABLE CHEST 1 VIEW COMPARISON:  October 10, 2015 FINDINGS: There is generalized interstitial edema with patchy alveolar edema in the bases as well. There are small pleural effusions bilaterally. There is cardiomegaly with pulmonary venous hypertension. No adenopathy evident. There is atherosclerotic calcification in the aorta. There is postoperative change in the right shoulder. IMPRESSION: Changes consistent with congestive heart failure. Electronically Signed   By: Lowella Grip III M.D.   On: 12/16/2015 09:08   I have personally reviewed and evaluated these images and lab results as part of my medical decision-making.   EKG Interpretation   Date/Time:  Tuesday December 16 2015 08:23:51 EDT Ventricular Rate:  107 PR Interval:  156 QRS Duration: 88 QT Interval:  343 QTC Calculation: 458 R Axis:   95 Text Interpretation:  Sinus tachycardia Probable anterior infarct, old  Rate has increased since last ECG, otherwise no significant changes  Confirmed by [facility] MD, Farron Lafond ([id number]) on 12/16/2015 9:59:57 AM      MDM   Final diagnoses:  Leg swelling  Acute respiratory distress ([name])  Acute congestive heart failure, unspecified congestive heart failure type ([name])  AKI (acute kidney injury) ([place])  Acute respiratory failure with hypoxia ([place])    82 year old female with a history of anemia secondary to angina dysplasia of the stomach and duodenum, who received an iron infusion yesterday afternoon, and last night  developed sudden onset of shortness of breath, cough and wheezing. Patient denies any chest pain or fevers, or preceding  URI symptoms. O2 saturation 84% on room air, wheezing, tachypnea on arrival.   Patient has significant wheezing on exam, and was given solumedrol, continuous duonebs, Mg with EMS and in ED.  Considered diagnosis of anaphylaxis or anaphylactoid reaction given patient had received iron infusion several hours prior to acute development of wheezing, however XR obtained shows signs of fluid overload.  Patient certainly has signs of this, bilateral peripheral edema (L greater than R), JVD, orthopnea. BNP mildly elevated at 300.  Given signs of fluid overload, gave 20mg  IV lasix (home dose)  Still would consider atypical anaphylactoid reaction to iron and follow pt response to diuresis.  She has no infectious symptoms, no leukocytosis, doubt pneumonia.   Obtained DDimer given asymmetric leg swelling which is elevated. Ordered duplex US of bilateral LE, however will hold on further CT PE study given significant orthopnea, and evidence of fluid overload as possible etiology of presentation on XR.  Anemia 7.4. (was 8 last week.) Pt denies black/bloody stool.      Gareth Morgan, MD 12/16/15 1758

## 2015-12-16 NOTE — ED Notes (Signed)
Pt arrives EMS with c/o respiratory distress which started last night and worsened. Pt has hx of anemia and was transfused 2 weeks ago with 4 units PRBC. Given Iron medicine yesterday.Arives with insipratory and expiratory wheezing and labored respiration.

## 2015-12-16 NOTE — Progress Notes (Signed)
*  PRELIMINARY RESULTS* Vascular Ultrasound Lower extremity venous duplex has been completed.  Preliminary findings: No evidence of DVT. A complex cystic area is noted in the left popliteal fossa, possibly atypical baker's cyst.   Landry Mellow, RDMS, RVT  12/16/2015, 2:45 PM

## 2015-12-16 NOTE — ED Notes (Signed)
PCXR done

## 2015-12-17 ENCOUNTER — Observation Stay (HOSPITAL_COMMUNITY): Payer: Commercial Managed Care - HMO

## 2015-12-17 ENCOUNTER — Observation Stay (HOSPITAL_BASED_OUTPATIENT_CLINIC_OR_DEPARTMENT_OTHER): Payer: Commercial Managed Care - HMO

## 2015-12-17 ENCOUNTER — Telehealth: Payer: Self-pay

## 2015-12-17 DIAGNOSIS — I429 Cardiomyopathy, unspecified: Secondary | ICD-10-CM | POA: Diagnosis present

## 2015-12-17 DIAGNOSIS — J9621 Acute and chronic respiratory failure with hypoxia: Secondary | ICD-10-CM

## 2015-12-17 DIAGNOSIS — R0600 Dyspnea, unspecified: Secondary | ICD-10-CM | POA: Diagnosis present

## 2015-12-17 DIAGNOSIS — I248 Other forms of acute ischemic heart disease: Secondary | ICD-10-CM

## 2015-12-17 DIAGNOSIS — I6789 Other cerebrovascular disease: Secondary | ICD-10-CM

## 2015-12-17 DIAGNOSIS — I2510 Atherosclerotic heart disease of native coronary artery without angina pectoris: Secondary | ICD-10-CM | POA: Diagnosis present

## 2015-12-17 DIAGNOSIS — E1165 Type 2 diabetes mellitus with hyperglycemia: Secondary | ICD-10-CM | POA: Diagnosis present

## 2015-12-17 DIAGNOSIS — I52 Other heart disorders in diseases classified elsewhere: Secondary | ICD-10-CM | POA: Diagnosis present

## 2015-12-17 DIAGNOSIS — I110 Hypertensive heart disease with heart failure: Secondary | ICD-10-CM | POA: Diagnosis present

## 2015-12-17 DIAGNOSIS — I5021 Acute systolic (congestive) heart failure: Secondary | ICD-10-CM | POA: Diagnosis not present

## 2015-12-17 DIAGNOSIS — N179 Acute kidney failure, unspecified: Secondary | ICD-10-CM | POA: Diagnosis present

## 2015-12-17 DIAGNOSIS — R778 Other specified abnormalities of plasma proteins: Secondary | ICD-10-CM | POA: Diagnosis present

## 2015-12-17 DIAGNOSIS — E7800 Pure hypercholesterolemia, unspecified: Secondary | ICD-10-CM | POA: Diagnosis present

## 2015-12-17 DIAGNOSIS — R7989 Other specified abnormal findings of blood chemistry: Secondary | ICD-10-CM | POA: Diagnosis not present

## 2015-12-17 DIAGNOSIS — E039 Hypothyroidism, unspecified: Secondary | ICD-10-CM

## 2015-12-17 DIAGNOSIS — Q2733 Arteriovenous malformation of digestive system vessel: Secondary | ICD-10-CM

## 2015-12-17 DIAGNOSIS — D500 Iron deficiency anemia secondary to blood loss (chronic): Secondary | ICD-10-CM | POA: Diagnosis present

## 2015-12-17 DIAGNOSIS — I5023 Acute on chronic systolic (congestive) heart failure: Secondary | ICD-10-CM | POA: Diagnosis present

## 2015-12-17 DIAGNOSIS — E1169 Type 2 diabetes mellitus with other specified complication: Secondary | ICD-10-CM | POA: Diagnosis present

## 2015-12-17 DIAGNOSIS — I5041 Acute combined systolic (congestive) and diastolic (congestive) heart failure: Secondary | ICD-10-CM | POA: Diagnosis not present

## 2015-12-17 DIAGNOSIS — Z79899 Other long term (current) drug therapy: Secondary | ICD-10-CM | POA: Diagnosis not present

## 2015-12-17 DIAGNOSIS — K31811 Angiodysplasia of stomach and duodenum with bleeding: Secondary | ICD-10-CM | POA: Diagnosis not present

## 2015-12-17 DIAGNOSIS — I509 Heart failure, unspecified: Secondary | ICD-10-CM | POA: Diagnosis not present

## 2015-12-17 DIAGNOSIS — Z96653 Presence of artificial knee joint, bilateral: Secondary | ICD-10-CM | POA: Diagnosis present

## 2015-12-17 DIAGNOSIS — M1990 Unspecified osteoarthritis, unspecified site: Secondary | ICD-10-CM | POA: Diagnosis present

## 2015-12-17 DIAGNOSIS — T502X5A Adverse effect of carbonic-anhydrase inhibitors, benzothiadiazides and other diuretics, initial encounter: Secondary | ICD-10-CM | POA: Diagnosis not present

## 2015-12-17 DIAGNOSIS — J9601 Acute respiratory failure with hypoxia: Secondary | ICD-10-CM | POA: Diagnosis present

## 2015-12-17 DIAGNOSIS — E1159 Type 2 diabetes mellitus with other circulatory complications: Secondary | ICD-10-CM | POA: Diagnosis not present

## 2015-12-17 LAB — MRSA PCR SCREENING: MRSA by PCR: NEGATIVE

## 2015-12-17 LAB — BASIC METABOLIC PANEL
ANION GAP: 9 (ref 5–15)
BUN: 24 mg/dL — ABNORMAL HIGH (ref 6–20)
CALCIUM: 8.1 mg/dL — AB (ref 8.9–10.3)
CO2: 21 mmol/L — AB (ref 22–32)
Chloride: 112 mmol/L — ABNORMAL HIGH (ref 101–111)
Creatinine, Ser: 0.99 mg/dL (ref 0.44–1.00)
GFR calc Af Amer: >60 mL/min (ref >60)
GFR calc non Af Amer: 52 mL/min — ABNORMAL LOW (ref >60)
GLUCOSE: 128 mg/dL — AB (ref 65–99)
Potassium: 3.6 mmol/L (ref 3.5–5.1)
Sodium: 142 mmol/L (ref 135–145)

## 2015-12-17 LAB — URINALYSIS, ROUTINE W REFLEX MICROSCOPIC
BILIRUBIN URINE: NEGATIVE
Glucose, UA: 100 mg/dL — AB
KETONES UR: NEGATIVE mg/dL
Leukocytes, UA: NEGATIVE
NITRITE: NEGATIVE
PH: 5 (ref 5.0–8.0)
Protein, ur: 30 mg/dL — AB
SPECIFIC GRAVITY, URINE: 1.012 (ref 1.005–1.030)

## 2015-12-17 LAB — HEMOGLOBIN A1C
Hgb A1c MFr Bld: 6.1 % — ABNORMAL HIGH (ref 4.8–5.6)
MEAN PLASMA GLUCOSE: 128 mg/dL

## 2015-12-17 LAB — URINE MICROSCOPIC-ADD ON

## 2015-12-17 LAB — GLUCOSE, CAPILLARY
GLUCOSE-CAPILLARY: 108 mg/dL — AB (ref 65–99)
Glucose-Capillary: 192 mg/dL — ABNORMAL HIGH (ref 65–99)
Glucose-Capillary: 109 mg/dL — ABNORMAL HIGH (ref 65–99)
Glucose-Capillary: 102 mg/dL — ABNORMAL HIGH (ref 65–99)
Glucose-Capillary: 156 mg/dL — ABNORMAL HIGH (ref 65–99)

## 2015-12-17 LAB — PREPARE RBC (CROSSMATCH)

## 2015-12-17 LAB — ECHOCARDIOGRAM COMPLETE
HEIGHTINCHES: 59 in
Weight: 2148.16 oz

## 2015-12-17 LAB — INFLUENZA PANEL BY PCR (TYPE A & B)
H1N1 flu by pcr: NOT DETECTED
Influenza A By PCR: NEGATIVE
Influenza B By PCR: NEGATIVE

## 2015-12-17 LAB — TSH: TSH: 7.264 u[IU]/mL — ABNORMAL HIGH (ref 0.350–4.500)

## 2015-12-17 LAB — TROPONIN I: Troponin I: 0.12 ng/mL — ABNORMAL HIGH (ref <0.031)

## 2015-12-17 MED ORDER — NITROGLYCERIN IN D5W 200-5 MCG/ML-% IV SOLN
0.0000 ug/min | INTRAVENOUS | Status: DC
  Administered 2015-12-17: 10 ug/min via INTRAVENOUS
  Administered 2015-12-17: 5 ug/min via INTRAVENOUS
  Filled 2015-12-17: qty 250

## 2015-12-17 MED ORDER — FUROSEMIDE 10 MG/ML IJ SOLN
60.0000 mg | Freq: Once | INTRAMUSCULAR | Status: AC
  Administered 2015-12-17: 60 mg via INTRAVENOUS
  Filled 2015-12-17: qty 6

## 2015-12-17 MED ORDER — LORAZEPAM 2 MG/ML IJ SOLN
0.5000 mg | Freq: Four times a day (QID) | INTRAMUSCULAR | Status: DC | PRN
  Administered 2015-12-17: 0.5 mg via INTRAVENOUS
  Filled 2015-12-17: qty 1

## 2015-12-17 MED ORDER — FUROSEMIDE 10 MG/ML IJ SOLN: 60.0000 mg | Freq: Once | INTRAMUSCULAR | Status: DC

## 2015-12-17 MED ORDER — CETYLPYRIDINIUM CHLORIDE 0.05 % MT LIQD
7.0000 mL | Freq: Two times a day (BID) | OROMUCOSAL | Status: DC
  Administered 2015-12-17 – 2015-12-21 (×7): 7 mL via OROMUCOSAL

## 2015-12-17 MED ORDER — ALBUTEROL SULFATE (2.5 MG/3ML) 0.083% IN NEBU
2.5000 mg | INHALATION_SOLUTION | RESPIRATORY_TRACT | Status: DC | PRN
  Administered 2015-12-17: 2.5 mg via RESPIRATORY_TRACT
  Filled 2015-12-17: qty 3

## 2015-12-17 MED ORDER — DIPHENHYDRAMINE HCL 50 MG/ML IJ SOLN
25.0000 mg | Freq: Once | INTRAMUSCULAR | Status: AC
  Administered 2015-12-17: 25 mg via INTRAVENOUS
  Filled 2015-12-17: qty 1

## 2015-12-17 MED ORDER — POTASSIUM CHLORIDE CRYS ER 20 MEQ PO TBCR
40.0000 meq | EXTENDED_RELEASE_TABLET | Freq: Two times a day (BID) | ORAL | Status: DC
  Administered 2015-12-17 – 2015-12-21 (×8): 40 meq via ORAL
  Filled 2015-12-17 (×8): qty 2

## 2015-12-17 MED ORDER — SODIUM CHLORIDE 0.9 % IV SOLN: Freq: Once | INTRAVENOUS | Status: DC

## 2015-12-17 MED ORDER — ACETAMINOPHEN 650 MG RE SUPP
650.0000 mg | Freq: Once | RECTAL | Status: AC
  Administered 2015-12-17: 650 mg via RECTAL
  Filled 2015-12-17: qty 1

## 2015-12-17 MED ORDER — ACETAMINOPHEN 325 MG PO TABS: 650.0000 mg | ORAL_TABLET | Freq: Once | ORAL | Status: DC

## 2015-12-17 MED ORDER — FUROSEMIDE 10 MG/ML IJ SOLN
80.0000 mg | Freq: Once | INTRAMUSCULAR | Status: AC
  Administered 2015-12-17: 80 mg via INTRAVENOUS
  Filled 2015-12-17: qty 8

## 2015-12-17 NOTE — Progress Notes (Signed)
PATIENT DETAILS Name: Connie [name] Age: 82 y.o. Sex: female Date of Birth: 1934/08/05 Admit Date: 12/16/2015 Admitting Physician Samella Parr, NP [id number] Juanda Crumble, MD  Subjective: Short of breath this am. Just about able to speak in full sentences. She normally weighs around 120 pounds, weight today is 134 pounds.  Assessment/Plan: Principal Problem: Acute hypoxic respiratory failure: Suspect secondary to acute decompensated CHF, Lasix 60 mg IV 1, place Foley, BiPAP at bedside. If no improvement, start nitroglycerin infusion to reduce preload. Will continue to monitor closely.  Active Problems: Acute congestive heart failure unspecified: Lasix as above, await echo. If still symptomatic, start IV nitroglycerin, otherwise will start  beta blocker when able to take oral medications and off BiPAP. Follow urine output, and daily weights. Will follow-up later during the day-and start appropriate medications depending on clinical response  Minimally elevated troponins: Trend is flat, not consistent with ACS. Likely demand ischemia in the setting of above. Will consult cardiology, previously on aspirin-but that has been discontinued given chronic a GI bleed and persistent anemia.  Elevated d-dimer: Doubt pulmonary embolism given findings of rales/rhonchi on exam-lower extremity Dopplers negative. Doubt further is required. Poor long-term candidate for anticoagulation given chronic GI bleeding.  Elevated lactic acid: Clinical picture is not consistent with sepsis-although upon review has some infiltrates vs atelectasis on CT chest-but no fever or leukocytosis. I suspect elevated lactic acid could be from CHF and resultant tissue hypoxia. For now suspect we could monitor her off antibiotics.  History of chronic GI bleeding (history of AVMs) and iron deficiency anemia: Hemoglobin down to 7.4, no overt bleeding evident-reviewed prior GI consultation records-has had  extensive endoscopic procedures in the past. Transfuse 1 unit of PRBC, just received IV iron on 3/27.  Type 2 diabetes: CBGs relatively stable, on oral hypoglycemic agents on hold. Continue SSI. Follow  Elevated d-dimer: Doubt pulmonary embolism given findings of rales/rhonchi on exam-lower extremity Dopplers negative. Doubt further is required. Poor long-term candidate for anticoagulation given chronic GI bleeding.  Hypothyroidism: Continue Synthroid-TSH elevated at 7.2-not sure when last dosing was adjusted. Suspect further adjustment could be done in the outpatient setting.  Dyslipidemia: Continue statin.  Disposition: Remain inpatient-remain in SDU  Antimicrobial agents  See below  Anti-infectives    None      DVT Prophylaxis: SCD's-given hx of chronic GI bleed  Code Status: Full code   Family Communication None at bedside  Procedures: None  CONSULTS:  None  Time spent 30 minutes-Greater than 50% of this time was spent in counseling, explanation of diagnosis, planning of further management, and coordination of care.  MEDICATIONS: Scheduled Meds: . sodium chloride   Intravenous Once  . acetaminophen  650 mg Oral Once  . antiseptic oral rinse  7 mL Mouth Rinse BID  . atorvastatin  40 mg Oral q1800  . diphenhydrAMINE  25 mg Intravenous Once  . enoxaparin (LOVENOX) injection  30 mg Subcutaneous QHS  . furosemide  60 mg Intravenous Once  . insulin aspart  0-5 Units Subcutaneous QHS  . insulin aspart  0-9 Units Subcutaneous TID WC  . levothyroxine  125 mcg Oral QAC breakfast  . pantoprazole  20 mg Oral Daily  . potassium chloride  40 mEq Oral BID  . sodium chloride flush  3 mL Intravenous Q12H  . traZODone  50 mg Oral QHS   Continuous Infusions:  PRN Meds:.sodium chloride, acetaminophen, albuterol, hydrALAZINE, LORazepam, ondansetron (  ZOFRAN) IV, sodium chloride flush    PHYSICAL EXAM: Vital signs in last 24 hours: Filed Vitals:   12/17/15 0350 12/17/15  0410 12/17/15 0733 12/17/15 0751  BP: 127/54  171/75   Pulse: 91  91   Temp:   98.1 F (36.7 C)   TempSrc:   Oral   Resp: 30  26   Height:      Weight:  59 kg (130 lb 1.1 oz)  60.9 kg (134 lb 4.2 oz)  SpO2: 94%  97%     Weight change:  Filed Weights   12/16/15 2208 12/17/15 0410 12/17/15 0751  Weight: 58.9 kg (129 lb 13.6 oz) 59 kg (130 lb 1.1 oz) 60.9 kg (134 lb 4.2 oz)   Body mass index is 27.1 kg/(m^2).   Gen Exam: In moderate distress due to shortness of breath-sitting up, tachypneic and just about able to complete full sentences.  Neck: Supple, + JVD.   Chest: Coarse rhonchi and rales up to mid lungs bilaterally. CVS: S1 S2 Regular-but tachy Abdomen: soft, BS +, non tender, non distended. Extremities: + edema, lower extremities warm to touch. Neurologic: Non Focal.  Skin: No Rash.   Wounds: N/A.   Intake/Output from previous day:  Intake/Output Summary (Last 24 hours) at 12/17/15 0811 Last data filed at 12/17/15 0645  Gross per 24 hour  Intake 1601.67 ml  Output    800 ml  Net 801.67 ml     LAB RESULTS: CBC  Recent Labs Lab 12/16/15 0835  WBC 9.0  HGB 7.4*  HCT 24.3*  PLT 326  MCV 85.9  MCH 26.1  MCHC 30.5  RDW 17.4*  LYMPHSABS 1.0  MONOABS 0.4  EOSABS 0.1  BASOSABS 0.0    Chemistries   Recent Labs Lab 12/16/15 0835 12/16/15 1609 12/17/15 0500  NA 141 142 142  K 3.6 3.1* 3.6  CL 108 108 112*  CO2 23 22 21*  GLUCOSE 272* 258* 128*  BUN 23* 24* 24*  CREATININE 1.04* 1.22* 0.99  CALCIUM 8.5* 8.8* 8.1*    CBG:  Recent Labs Lab 12/16/15 1241 12/16/15 1958  GLUCAP 291* 166*    GFR Estimated Creatinine Clearance: 36 mL/min (by C-G formula based on Cr of 0.99).  Coagulation profile No results for input(s): INR, PROTIME in the last 168 hours.  Cardiac Enzymes  Recent Labs Lab 12/16/15 1609 12/16/15 2240 12/17/15 0500  TROPONINI 0.17* 0.15* 0.12*    Invalid input(s): POCBNP  Recent Labs  12/16/15 0835  DDIMER 2.17*      Recent Labs  12/16/15 1030  HGBA1C 6.1*   No results for input(s): CHOL, HDL, LDLCALC, TRIG, CHOLHDL, LDLDIRECT in the last 72 hours.  Recent Labs  12/17/15 0500  TSH 7.264*   No results for input(s): VITAMINB12, FOLATE, FERRITIN, TIBC, IRON, RETICCTPCT in the last 72 hours. No results for input(s): LIPASE, AMYLASE in the last 72 hours.  Urine Studies No results for input(s): UHGB, CRYS in the last 72 hours.  Invalid input(s): UACOL, UAPR, USPG, UPH, UTP, UGL, UKET, UBIL, UNIT, UROB, ULEU, UEPI, UWBC, URBC, UBAC, CAST, UCOM, BILUA  MICROBIOLOGY: Recent Results (from the past 240 hour(s))  MRSA PCR Screening     Status: None   Collection Time: 12/16/15 10:06 PM  Result Value Ref Range Status   MRSA by PCR NEGATIVE NEGATIVE Final    Comment:        The GeneXpert MRSA Assay (FDA approved for NASAL specimens only), is one component of a comprehensive MRSA  colonization surveillance program. It is not intended to diagnose MRSA infection nor to guide or monitor treatment for MRSA infections.     RADIOLOGY STUDIES/RESULTS: Dg Chest 2 View  12/17/2015  CLINICAL DATA:  Acute CHF, respiratory distress, acute renal injury, diabetes EXAM: CHEST  2 VIEW COMPARISON:  Chest x-ray and chest CT scan of December 16, 2015 FINDINGS: The lungs are adequately inflated. The interstitial markings remain increased bilaterally. The pulmonary vascularity appears less engorged but remains abnormal. The cardiac silhouette remains enlarged. There is left lower lobe atelectasis. There small bilateral pleural effusions. The bony thorax exhibits no acute abnormalities. There degenerative changes of both shoulders. IMPRESSION: Improving CHF. Persistent bilateral interstitial edema, small pleural effusions, and left lower lobe atelectasis. Electronically Signed   By: [name]  Martinique M.D.   On: 12/17/2015 07:47   Ct Chest Wo Contrast  12/16/2015  CLINICAL DATA:  Recent GI bleeding and blood transfusion  yesterday. Hypoxia. EXAM: CT CHEST WITHOUT CONTRAST TECHNIQUE: Multidetector CT imaging of the chest was performed following the standard protocol without IV contrast. COMPARISON:  None. FINDINGS: THORACIC INLET/BODY WALL: Body wall edema. MEDIASTINUM: Normal heart size. Trace pericardial fluid. Atherosclerosis, including throughout the coronary arteries. No acute vascular finding. No adenopathy. LUNG WINDOWS: Small layering bilateral pleural effusion with multi segment lower lobe opacification. Bronchial crowding is compatible of atelectasis. No septal thickening to suggest interstitial edema. No consolidation. UPPER ABDOMEN: No acute findings. OSSEOUS: Advanced osteopenia. Diffuse disc degeneration and advanced right glenohumeral osteoarthritis. T12 moderate compression deformity, chronic IMPRESSION: 1. Volume overload with body wall edema and small pleural effusions. No pulmonary edema. 2. Multi segment lower lobe atelectasis. Electronically Signed   By: Monte Fantasia M.D.   On: 12/16/2015 18:21   Dg Chest Portable 1 View  12/16/2015  CLINICAL DATA:  Shortness of Breath EXAM: PORTABLE CHEST 1 VIEW COMPARISON:  October 10, 2015 FINDINGS: There is generalized interstitial edema with patchy alveolar edema in the bases as well. There are small pleural effusions bilaterally. There is cardiomegaly with pulmonary venous hypertension. No adenopathy evident. There is atherosclerotic calcification in the aorta. There is postoperative change in the right shoulder. IMPRESSION: Changes consistent with congestive heart failure. Electronically Signed   By: Lowella Grip III M.D.   On: 12/16/2015 09:08    Oren Binet, MD  [facility] Pager:[phone]  If 7PM-7AM, please contact night-coverage [email or web address] Password [email or web address] 12/17/2015, 8:11 AM   LOS: 1 day

## 2015-12-17 NOTE — Telephone Encounter (Signed)
Patient's son Octavia Bruckner calling as a FYI to Dr. [name] that patient was taken by ambulance to [facility] with SOB symptoms.  Delle Reining, RN notified.

## 2015-12-17 NOTE — Progress Notes (Signed)
Dear Doctor: Sloan Leiter This patient has been identified as a candidate for PICC for the following reason (s): IV therapy over 48 hours, poor veins/poor circulatory system (CHF, COPD, emphysema, diabetes, steroid use, IV drug abuse, etc.) and restarts due to phlebitis and infiltration in 24 hours If you agree, please write an order for the indicated device. For any questions contact the Vascular Access Team at [phone] if no answer, please leave a message.  Thank you for supporting the early vascular access assessment program.

## 2015-12-17 NOTE — Progress Notes (Signed)
Pt placed on BIPAP per MD order due to increased work of breathing.  Pt tolerating well at this time.  RT will continue to monitor.  

## 2015-12-17 NOTE — Consult Note (Signed)
Reason for Consult:   CHF  Requesting Physician: [facility] [facility] New  HPI:   82 y/o female with no prior cardiac history, admitted 12/16/15 with acute CHF. She has GI AVMs and chronic anemia and had received an Iron infusion as an OP 12/15/15. She woke up 12/16/15 SOB. She was admitted for further evaluation. After admission her lactic acid was noted to be increased and she was given IVF. This am she developed increasing dyspnea and required BiPap and IV Lasix. She is improved but still SOB and did not tolerate a trial of nasal canula O2. She has some vague chest discomfort.   PMHx:  Past Medical History  Diagnosis Date  . Hypertension   . Obesity   . Allergy     RHINITIS  . Hypothyroidism   . Hypercholesteremia   . Bleeding gastric ulcer     hx/notes 11/21/2015  . Angiodysplasia of stomach     hx/notes 11/21/2015  . Angiodysplasia of duodenum     hx/notes 11/21/2015  . History of blood transfusion 01/2015; 06/2015; 11/21/2015  . Type II diabetes mellitus ([place])   . Pneumonia "several times"  . Chronic blood loss anemia     secondary to angiodysplasia of the stomach and duodenum as well as history of bleeding gastric ulcer hx/notes 11/21/2015  . Arthritis     "joints ache"    Past Surgical History  Procedure Laterality Date  . Total thyroidectomy  ~ 1966  . Shoulder open rotator cuff repair Right 12/2004    open subacromial decompression, distal clavicle  resection, rotator cuff repair/notes 02/02/2011  . Colonoscopy N/A 08/30/2014    Procedure: COLONOSCOPY;  Surgeon: Beryle Beams, MD;  Location: [facility] ENDOSCOPY;  Service: Endoscopy;  Laterality: N/A;  . Hot hemostasis N/A 08/30/2014    Procedure: HOT HEMOSTASIS (ARGON PLASMA COAGULATION/BICAP);  Surgeon: Beryle Beams, MD;  Location: [facility];  Service: Endoscopy;  Laterality: N/A;  . Colonoscopy N/A 02/21/2015    Procedure: COLONOSCOPY;  Surgeon: Carol Ada, MD;  Location: [facility] ENDOSCOPY;  Service:  Endoscopy;  Laterality: N/A;  . Givens capsule study N/A 05/15/2015    Procedure: GIVENS CAPSULE STUDY;  Surgeon: Carol Ada, MD;  Location: [place];  Service: Endoscopy;  Laterality: N/A;  . Enteroscopy N/A 06/06/2015    Procedure: ENTEROSCOPY;  Surgeon: Carol Ada, MD;  Location: [facility]OSCOPY;  Service: Endoscopy;  Laterality: N/A;  . Hot hemostasis N/A 06/06/2015    Procedure: HOT HEMOSTASIS (ARGON PLASMA COAGULATION/BICAP);  Surgeon: Carol Ada, MD;  Location: [facility];  Service: Endoscopy;  Laterality: N/A;  . Tonsillectomy  1940s  . Joint replacement    . Total knee arthroplasty Bilateral ~ 2002  . Revision total knee arthroplasty Bilateral 2004-2015  . Fracture surgery      SOCHx:  reports that she has never smoked. She has never used smokeless tobacco. She reports that she does not drink alcohol or use illicit drugs.  FAMHx: Family History  Problem Relation Age of Onset  . Anemia Sister   . Asthma Mother   . Ulcers Father     ALLERGIES: No Known Allergies  ROS: Review of Systems: General: negative for chills, fever, night sweats or weight changes.  Cardiovascular: negative for chest pain, dyspnea on exertion, orthopnea, palpitations, paroxysmal nocturnal dyspnea or shortness of breath Pt has noted increased LE edema over the past week  HEENT: negative for any visual disturbances, blindness, glaucoma Dermatological: negative for rash Respiratory: negative  for cough, hemoptysis, or wheezing Urologic: negative for hematuria or dysuria Abdominal: negative for nausea, vomiting, diarrhea, bright red blood per rectum, melena, or hematemesis Neurologic: negative for visual changes, syncope, or dizziness Musculoskeletal: negative for back pain, joint pain, or swelling Psych: cooperative and appropriate All other systems reviewed and are otherwise negative except as noted above.   HOME MEDICATIONS: Prior to Admission medications   Medication Sig Start Date End  Date Taking? Authorizing Provider  atorvastatin (LIPITOR) 40 MG tablet TAKE ONE TABLET BY MOUTH ONCE DAILY 09/01/15  Yes [name], MD  furosemide (LASIX) 20 MG tablet Take 1 tablet (20 mg total) by mouth daily. 11/27/15  Yes [name], MD  levothyroxine (SYNTHROID, LEVOTHROID) 125 MCG tablet TAKE ONE TABLET BY MOUTH EVERY DAY 10/28/14  Yes [name], MD  pantoprazole (PROTONIX) 20 MG tablet Take 1 tablet (20 mg total) by mouth daily. 11/22/15  Yes Charlynne Cousins, MD  pioglitazone (ACTOS) 30 MG tablet Take 1 tablet (30 mg total) by mouth daily. 07/24/15  Yes [name], MD  potassium chloride (K-DUR,KLOR-CON) 10 MEQ tablet Take 1 tablet (10 mEq total) by mouth 2 (two) times daily. 11/27/15  Yes [name], MD  traZODone (DESYREL) 50 MG tablet Half tablet at night as needed for sleep Patient taking differently: Take 25 mg by mouth at bedtime as needed for sleep. Half tablet at night as needed for sleep 11/20/15  Yes [name], MD    HOSPITAL MEDICATIONS: I have reviewed the patient's current medications.  VITALS: Blood pressure 171/75, pulse 97, temperature 98.1 F (36.7 C), temperature source Oral, resp. rate 25, height 4\' 11"  (1.499 m), weight 134 lb 4.2 oz (60.9 kg), SpO2 99 %.  PHYSICAL EXAM: General appearance: alert, cooperative, appears stated age and SOB, on BiPap Neck: no carotid bruit and JVD Lungs: basilar rales Heart: regular rate and rhythm Abdomen: soft, non-tender; bowel sounds normal; no masses,  no organomegaly Extremities: 1+ edema Pulses: 2+ and symmetric Skin: pale, cool, dry Neurologic: Grossly normal  LABS: Results for orders placed or performed during the hospital encounter of 12/16/15 (from the past 24 hour(s))  Type and screen [place]     Status: None (Preliminary result)   Collection Time: 12/16/15 10:30 AM  Result Value Ref Range   ABO/RH(D) A POS    Antibody Screen NEG    Sample Expiration 12/19/2015    Unit  Number [id number]    Blood Component Type RED CELLS,LR    Unit division 00    Status of Unit ALLOCATED    Transfusion Status OK TO TRANSFUSE    Crossmatch Result Compatible   Hemoglobin A1c     Status: Abnormal   Collection Time: 12/16/15 10:30 AM  Result Value Ref Range   Hgb A1c MFr Bld 6.1 (H) 4.8 - 5.6 %   Mean Plasma Glucose 128 mg/dL  I-Stat Troponin, ED - 0, 3, 6 hours (not at [facility])     Status: Abnormal   Collection Time: 12/16/15 11:48 AM  Result Value Ref Range   Troponin i, poc 0.10 (HH) 0.00 - 0.08 ng/mL   Comment NOTIFIED PHYSICIAN    Comment 3          I-Stat CG4 Lactic Acid, ED     Status: Abnormal   Collection Time: 12/16/15 11:50 AM  Result Value Ref Range   Lactic Acid, Venous 3.22 (HH) 0.5 - 2.0 mmol/L   Comment NOTIFIED PHYSICIAN  CBG monitoring, ED     Status: Abnormal   Collection Time: 12/16/15 12:41 PM  Result Value Ref Range   Glucose-Capillary 291 (H) 65 - 99 mg/dL  Troponin I (q 6hr x 3)     Status: Abnormal   Collection Time: 12/16/15  4:09 PM  Result Value Ref Range   Troponin I 0.17 (H) <0.031 ng/mL  Basic metabolic panel     Status: Abnormal   Collection Time: 12/16/15  4:09 PM  Result Value Ref Range   Sodium 142 135 - 145 mmol/L   Potassium 3.1 (L) 3.5 - 5.1 mmol/L   Chloride 108 101 - 111 mmol/L   CO2 22 22 - 32 mmol/L   Glucose, Bld 258 (H) 65 - 99 mg/dL   BUN 24 (H) 6 - 20 mg/dL   Creatinine, Ser 1.22 (H) 0.44 - 1.00 mg/dL   Calcium 8.8 (L) 8.9 - 10.3 mg/dL   GFR calc non Af Amer 41 (L) >60 mL/min   GFR calc Af Amer 47 (L) >60 mL/min   Anion gap 12 5 - 15  I-Stat Troponin, ED - 0, 3, 6 hours (not at [facility])     Status: Abnormal   Collection Time: 12/16/15  4:16 PM  Result Value Ref Range   Troponin i, poc 0.12 (HH) 0.00 - 0.08 ng/mL   Comment NOTIFIED PHYSICIAN    Comment 3          Lactic acid, plasma     Status: Abnormal   Collection Time: 12/16/15  4:23 PM  Result Value Ref Range   Lactic Acid, Venous 4.3 (HH) 0.5 - 2.0 mmol/L   I-Stat arterial blood gas, ED     Status: Abnormal   Collection Time: 12/16/15  5:58 PM  Result Value Ref Range   pH, Arterial 7.440 7.350 - 7.450   pCO2 arterial 32.9 (L) 35.0 - 45.0 mmHg   pO2, Arterial 68.0 (L) 80.0 - 100.0 mmHg   Bicarbonate 22.3 20.0 - 24.0 mEq/L   TCO2 23 0 - 100 mmol/L   O2 Saturation 94.0 %   Acid-base deficit 2.0 0.0 - 2.0 mmol/L   Patient temperature 98.6 F    Collection site RADIAL, ALLEN'S TEST ACCEPTABLE    Sample type ARTERIAL   Lactic acid, plasma     Status: Abnormal   Collection Time: 12/16/15  7:05 PM  Result Value Ref Range   Lactic Acid, Venous 3.4 (HH) 0.5 - 2.0 mmol/L  CBG monitoring, ED     Status: Abnormal   Collection Time: 12/16/15  7:58 PM  Result Value Ref Range   Glucose-Capillary 166 (H) 65 - 99 mg/dL  Procalcitonin - Baseline     Status: None   Collection Time: 12/16/15  8:18 PM  Result Value Ref Range   Procalcitonin 0.72 ng/mL  Influenza panel by PCR (type A & B, H1N1)     Status: None   Collection Time: 12/16/15  8:49 PM  Result Value Ref Range   Influenza A By PCR NEGATIVE NEGATIVE   Influenza B By PCR NEGATIVE NEGATIVE   H1N1 flu by pcr NOT DETECTED NOT DETECTED  MRSA PCR Screening     Status: None   Collection Time: 12/16/15 10:06 PM  Result Value Ref Range   MRSA by PCR NEGATIVE NEGATIVE  Troponin I (q 6hr x 3)     Status: Abnormal   Collection Time: 12/16/15 10:40 PM  Result Value Ref Range   Troponin I 0.15 (H) <0.031 ng/mL  Urinalysis, Routine w reflex microscopic (not at [facility])     Status: Abnormal   Collection Time: 12/16/15 11:47 PM  Result Value Ref Range   Color, Urine YELLOW YELLOW   APPearance CLEAR CLEAR   Specific Gravity, Urine 1.012 1.005 - 1.030   pH 5.0 5.0 - 8.0   Glucose, UA 100 (A) NEGATIVE mg/dL   Hgb urine dipstick TRACE (A) NEGATIVE   Bilirubin Urine NEGATIVE NEGATIVE   Ketones, ur NEGATIVE NEGATIVE mg/dL   Protein, ur 30 (A) NEGATIVE mg/dL   Nitrite NEGATIVE NEGATIVE   Leukocytes, UA  NEGATIVE NEGATIVE  Urine microscopic-add on     Status: Abnormal   Collection Time: 12/16/15 11:47 PM  Result Value Ref Range   Squamous Epithelial / LPF 0-5 (A) NONE SEEN   WBC, UA 0-5 0 - 5 WBC/hpf   RBC / HPF 0-5 0 - 5 RBC/hpf   Bacteria, UA RARE (A) NONE SEEN   Casts HYALINE CASTS (A) NEGATIVE  Troponin I (q 6hr x 3)     Status: Abnormal   Collection Time: 12/17/15  5:00 AM  Result Value Ref Range   Troponin I 0.12 (H) <0.031 ng/mL  Basic metabolic panel     Status: Abnormal   Collection Time: 12/17/15  5:00 AM  Result Value Ref Range   Sodium 142 135 - 145 mmol/L   Potassium 3.6 3.5 - 5.1 mmol/L   Chloride 112 (H) 101 - 111 mmol/L   CO2 21 (L) 22 - 32 mmol/L   Glucose, Bld 128 (H) 65 - 99 mg/dL   BUN 24 (H) 6 - 20 mg/dL   Creatinine, Ser 0.99 0.44 - 1.00 mg/dL   Calcium 8.1 (L) 8.9 - 10.3 mg/dL   GFR calc non Af Amer 52 (L) >60 mL/min   GFR calc Af Amer >60 >60 mL/min   Anion gap 9 5 - 15  TSH     Status: Abnormal   Collection Time: 12/17/15  5:00 AM  Result Value Ref Range   TSH 7.264 (H) 0.350 - 4.500 uIU/mL  Glucose, capillary     Status: Abnormal   Collection Time: 12/17/15  7:34 AM  Result Value Ref Range   Glucose-Capillary 109 (H) 65 - 99 mg/dL   Comment 1 Notify RN    Comment 2 Document in Chart   Prepare RBC     Status: None   Collection Time: 12/17/15  8:10 AM  Result Value Ref Range   Order Confirmation ORDER PROCESSED BY BLOOD BANK     EKG: NSR, poor anterior RW  Echo 12/17/15 Study Conclusions  - Left ventricle: The cavity size was moderately dilated. Wall  thickness was normal. Systolic function was moderately reduced.  The estimated ejection fraction was in the range of 35% to 40%.  Diffuse hypokinesis. Doppler parameters are consistent with both  elevated ventricular end-diastolic filling pressure and elevated  left atrial filling pressure. - Aortic valve: There was trivial regurgitation. - Mitral valve: There was mild regurgitation. -  Atrial septum: No defect or patent foramen ovale was identified. - Pulmonary arteries: PA peak pressure: 37 mm Hg (S). - Pericardium, extracardiac: Small posterior pericardial effusion   IMAGING: Dg Chest 2 View  12/17/2015  CLINICAL DATA:  Acute CHF, respiratory distress, acute renal injury, diabetes EXAM: CHEST  2 VIEW COMPARISON:  Chest x-ray and chest CT scan of December 16, 2015 FINDINGS: The lungs are adequately inflated. The interstitial markings remain increased bilaterally. The pulmonary vascularity appears less engorged but  remains abnormal. The cardiac silhouette remains enlarged. There is left lower lobe atelectasis. There small bilateral pleural effusions. The bony thorax exhibits no acute abnormalities. There degenerative changes of both shoulders. IMPRESSION: Improving CHF. Persistent bilateral interstitial edema, small pleural effusions, and left lower lobe atelectasis. Electronically Signed   By: [name]  Martinique M.D.   On: 12/17/2015 07:47   Ct Chest Wo Contrast  12/16/2015  CLINICAL DATA:  Recent GI bleeding and blood transfusion yesterday. Hypoxia. EXAM: CT CHEST WITHOUT CONTRAST TECHNIQUE: Multidetector CT imaging of the chest was performed following the standard protocol without IV contrast. COMPARISON:  None. FINDINGS: THORACIC INLET/BODY WALL: Body wall edema. MEDIASTINUM: Normal heart size. Trace pericardial fluid. Atherosclerosis, including throughout the coronary arteries. No acute vascular finding. No adenopathy. LUNG WINDOWS: Small layering bilateral pleural effusion with multi segment lower lobe opacification. Bronchial crowding is compatible of atelectasis. No septal thickening to suggest interstitial edema. No consolidation. UPPER ABDOMEN: No acute findings. OSSEOUS: Advanced osteopenia. Diffuse disc degeneration and advanced right glenohumeral osteoarthritis. T12 moderate compression deformity, chronic IMPRESSION: 1. Volume overload with body wall edema and small pleural  effusions. No pulmonary edema. 2. Multi segment lower lobe atelectasis. Electronically Signed   By: Monte Fantasia M.D.   On: 12/16/2015 18:21   Dg Chest Portable 1 View  12/16/2015  CLINICAL DATA:  Shortness of Breath EXAM: PORTABLE CHEST 1 VIEW COMPARISON:  October 10, 2015 FINDINGS: There is generalized interstitial edema with patchy alveolar edema in the bases as well. There are small pleural effusions bilaterally. There is cardiomegaly with pulmonary venous hypertension. No adenopathy evident. There is atherosclerotic calcification in the aorta. There is postoperative change in the right shoulder. IMPRESSION: Changes consistent with congestive heart failure. Electronically Signed   By: Lowella Grip III M.D.   On: 12/16/2015 09:08    IMPRESSION: Principal Problem:   Acute congestive heart failure ([name]) Active Problems:   Acute respiratory distress ([name])   Hypertension associated with diabetes ([place])   Angiodysplasia of stomach and duodenum with hemorrhage   Iron deficiency anemia secondary to blood loss (chronic)   AKI (acute kidney injury) ([place])   Hypothyroidism   Hyperlipidemia associated with type 2 diabetes mellitus ([place])   RECOMMENDATION: MD to see. She is to received a blood transfusion and more Lasix.   Her B/P is high- IV NTG added.   Hesitant to add ASA with known anemia and AVMs.   Consider ischemic evaluation when stable- ? Myoview to r/o high risk anatomy, if not present- medical Rx.   Time Spent Directly with Patient: [address] minutes  Kerin Ransom, [place] beeper 12/17/2015, 9:37 AM   Personally seen and examined. Agree with above.  82 year old with diabetes, acute systolic heart failure, hypertensive heart disease with heart failure, acute respiratory failure, iron deficiency anemia secondary to gastric AVMs on iron therapy with newly discovered cardiomyopathy EF 35%.  Acute systolic heart failure -Newly discovered EF 35% -No prior history of MI or  significant anginal discomfort. Vague chest discomfort previously described. -Significant shortness of breath likely secondary to elevated left ventricular end-diastolic volume in the setting of fluid overload. -Agree with Lasix and demonstration. She seems to be improving on BiPAP. Baseline weight is approximately 120 pounds. Previous weight 134 here. -Unable to take aspirin or Plavix because of gastric AVMs. -My suggestion would be to perform diagnostic angiography to assess coronary anatomy to exclude the possibility of severe triple-vessel coronary artery disease given her advanced age and diabetes with her newly discovered  ejection fraction of 35% once she is stabilized from a bleeding standpoint. This potentially could take place in the next 2 days.  Gastric AVMs -Ongoing issue requiring transfusion, hemoglobin 7.4. Prior GI consultation records-extensive endoscopic procedures in the past. Just received iron therapy on 3/27.  Elevated troponin -Likely secondary to demand ischemia in the setting of acute systolic heart failure. 0.12.  Currently she is sitting up in bed on BiPAP, mild respiratory rhonchi bilaterally, able to talk to me through BiPAP fairly successfully. She feels as if she is getting better.  We will follow along closely.

## 2015-12-17 NOTE — Progress Notes (Signed)
Echocardiogram 2D Echocardiogram has been performed.  Joelene Millin 12/17/2015, 9:08 AM

## 2015-12-17 NOTE — Progress Notes (Signed)
Pt taken off bipap and placed on 10L HFNC. RN at bedside and pt and family in agreement to try. Pt denies SOB, no increased WOB, spo2 99%. RT will continue to monitor.

## 2015-12-18 DIAGNOSIS — J9601 Acute respiratory failure with hypoxia: Secondary | ICD-10-CM

## 2015-12-18 DIAGNOSIS — I5041 Acute combined systolic (congestive) and diastolic (congestive) heart failure: Secondary | ICD-10-CM | POA: Diagnosis present

## 2015-12-18 DIAGNOSIS — D500 Iron deficiency anemia secondary to blood loss (chronic): Secondary | ICD-10-CM

## 2015-12-18 LAB — URINE CULTURE

## 2015-12-18 LAB — TYPE AND SCREEN
ABO/RH(D): A POS
ANTIBODY SCREEN: NEGATIVE
Unit division: 0

## 2015-12-18 LAB — GLUCOSE, CAPILLARY
GLUCOSE-CAPILLARY: 92 mg/dL (ref 65–99)
GLUCOSE-CAPILLARY: 95 mg/dL (ref 65–99)
Glucose-Capillary: 108 mg/dL — ABNORMAL HIGH (ref 65–99)

## 2015-12-18 LAB — BASIC METABOLIC PANEL
Anion gap: 9 (ref 5–15)
BUN: 23 mg/dL — AB (ref 6–20)
CO2: 28 mmol/L (ref 22–32)
CREATININE: 1.13 mg/dL — AB (ref 0.44–1.00)
Calcium: 8.3 mg/dL — ABNORMAL LOW (ref 8.9–10.3)
Chloride: 107 mmol/L (ref 101–111)
GFR, EST AFRICAN AMERICAN: 52 mL/min — AB (ref >60)
GFR, EST NON AFRICAN AMERICAN: 45 mL/min — AB (ref >60)
Glucose, Bld: 97 mg/dL (ref 65–99)
POTASSIUM: 3.6 mmol/L (ref 3.5–5.1)
SODIUM: 144 mmol/L (ref 135–145)

## 2015-12-18 LAB — CBC
HCT: 27.1 % — ABNORMAL LOW (ref 36.0–46.0)
Hemoglobin: 8.3 g/dL — ABNORMAL LOW (ref 12.0–15.0)
MCH: 25.7 pg — ABNORMAL LOW (ref 26.0–34.0)
MCHC: 30.6 g/dL (ref 30.0–36.0)
MCV: 83.9 fL (ref 78.0–100.0)
Platelets: 317 10*3/uL (ref 150–400)
RBC: 3.23 MIL/uL — ABNORMAL LOW (ref 3.87–5.11)
RDW: 17.2 % — AB (ref 11.5–15.5)
WBC: 14.7 10*3/uL — AB (ref 4.0–10.5)

## 2015-12-18 LAB — PROCALCITONIN: PROCALCITONIN: 0.89 ng/mL

## 2015-12-18 MED ORDER — SODIUM CHLORIDE 0.9 % IV SOLN: 250.0000 mL | INTRAVENOUS | Status: DC | PRN

## 2015-12-18 MED ORDER — SODIUM CHLORIDE 0.9% FLUSH: 3.0000 mL | INTRAVENOUS | Status: DC | PRN

## 2015-12-18 MED ORDER — SODIUM CHLORIDE 0.9% FLUSH
3.0000 mL | Freq: Two times a day (BID) | INTRAVENOUS | Status: DC
  Administered 2015-12-18: 3 mL via INTRAVENOUS

## 2015-12-18 MED ORDER — CARVEDILOL 6.25 MG PO TABS
6.2500 mg | ORAL_TABLET | Freq: Two times a day (BID) | ORAL | Status: DC
  Administered 2015-12-18 – 2015-12-21 (×5): 6.25 mg via ORAL
  Filled 2015-12-18 (×6): qty 1

## 2015-12-18 MED ORDER — ISOSORBIDE MONONITRATE ER 30 MG PO TB24
30.0000 mg | ORAL_TABLET | Freq: Every day | ORAL | Status: DC
  Administered 2015-12-18 – 2015-12-19 (×2): 30 mg via ORAL
  Filled 2015-12-18 (×3): qty 1

## 2015-12-18 MED ORDER — FUROSEMIDE 10 MG/ML IJ SOLN
40.0000 mg | Freq: Two times a day (BID) | INTRAMUSCULAR | Status: DC
  Administered 2015-12-18: 40 mg via INTRAVENOUS
  Filled 2015-12-18: qty 4

## 2015-12-18 MED ORDER — SODIUM CHLORIDE 0.9 % IV SOLN
INTRAVENOUS | Status: DC
  Administered 2015-12-19: 06:00:00 via INTRAVENOUS

## 2015-12-18 MED ORDER — ASPIRIN 81 MG PO CHEW
81.0000 mg | CHEWABLE_TABLET | ORAL | Status: AC
  Administered 2015-12-19: 81 mg via ORAL
  Filled 2015-12-18: qty 1

## 2015-12-18 NOTE — Progress Notes (Signed)
Marland Kitchen    HEMATOLOGY/ONCOLOGY CLINIC NOTE  Date of Service: .12/08/2015  Patient Care Team: [name], MD as PCP - General (Family Medicine)  CHIEF COMPLAINTS/PURPOSE OF CONSULTATION:  Iron deficiency anemia due to GI bleeding from multiple AVMs  HISTORY OF PRESENTING ILLNESS:   Connie [name] is a wonderful 82 y.o. female who I had seen in the hospital on 3/3/2017when she presented with severe some dramatic anemia requiring blood transfusions.  Patient has a history of hypertension, hypothyroidism, diabetes and has had issues with recurrent gastrointestinal bleeds related to both upper and lower GI tract AVMs which have been ablated multiple times in the past.  She has also had issues with gastric erosions.  She reports that on 2 occasions of bleeding has been severe enough to require blood transfusions. Her hemoglobin was noted to be 5.4 with an MCV of 79.9 on her presentation.  She was noted to have been on aspirin for primary prophylaxis. Her fecal occult blood testing was noted to be negative.  Hemolytic markers were negative. Ferritin was noted to be 34 with a saturation of 18%. Patient received 4 units of PRBCs.  Her aspirin was held.she was given IV feraheme .   Patient returns for followup clinic today. Her hemoglobin on discharge was 10.6 with an MCV of 84.4.  Her hemoglobin is down to 8.8 today with an MCV of 86.6.RDW is increasing.  Ferritin level was noted to be 18. Informed consent was obtained to set her up for IV iron in the clinic. Patient noted no overt GI bleeding.  MEDICAL HISTORY:  Past Medical History  Diagnosis Date  . Hypertension   . Obesity   . Allergy     RHINITIS  . Hypothyroidism   . Hypercholesteremia   . Bleeding gastric ulcer     hx/notes 11/21/2015  . Angiodysplasia of stomach     hx/notes 11/21/2015  . Angiodysplasia of duodenum     hx/notes 11/21/2015  . History of blood transfusion 01/2015; 06/2015; 11/21/2015  . Type II diabetes mellitus  (Connie [name])   . Pneumonia "several times"  . Chronic blood loss anemia     secondary to angiodysplasia of the stomach and duodenum as well as history of bleeding gastric ulcer hx/notes 11/21/2015  . Arthritis     "joints ache"    SURGICAL HISTORY: Past Surgical History  Procedure Laterality Date  . Total thyroidectomy  ~ 1966  . Shoulder open rotator cuff repair Right 12/2004    open subacromial decompression, distal clavicle  resection, rotator cuff repair/notes 02/02/2011  . Colonoscopy N/A 08/30/2014    Procedure: COLONOSCOPY;  Surgeon: Beryle Beams, MD;  Location: [facility] ENDOSCOPY;  Service: Endoscopy;  Laterality: N/A;  . Hot hemostasis N/A 08/30/2014    Procedure: HOT HEMOSTASIS (ARGON PLASMA COAGULATION/BICAP);  Surgeon: Beryle Beams, MD;  Location: [facility];  Service: Endoscopy;  Laterality: N/A;  . Colonoscopy N/A 02/21/2015    Procedure: COLONOSCOPY;  Surgeon: Carol Ada, MD;  Location: [facility] ENDOSCOPY;  Service: Endoscopy;  Laterality: N/A;  . Givens capsule study N/A 05/15/2015    Procedure: GIVENS CAPSULE STUDY;  Surgeon: Carol Ada, MD;  Location: [facility];  Service: Endoscopy;  Laterality: N/A;  . Enteroscopy N/A 06/06/2015    Procedure: ENTEROSCOPY;  Surgeon: Carol Ada, MD;  Location: [facility]OSCOPY;  Service: Endoscopy;  Laterality: N/A;  . Hot hemostasis N/A 06/06/2015    Procedure: HOT HEMOSTASIS (ARGON PLASMA COAGULATION/BICAP);  Surgeon: Carol Ada, MD;  Location: [facility];  Service:  Endoscopy;  Laterality: N/A;  . Tonsillectomy  1940s  . Joint replacement    . Total knee arthroplasty Bilateral ~ 2002  . Revision total knee arthroplasty Bilateral 2004-2015  . Fracture surgery      SOCIAL HISTORY: Social History   Social History  . Marital Status: Married    Spouse Name: N/A  . Number of Children: N/A  . Years of Education: N/A   Occupational History  . Not on file.   Social History Main Topics  . Smoking status: Never Smoker   . Smokeless tobacco:  Never Used  . Alcohol Use: No  . Drug Use: No  . Sexual Activity: Not Currently   Other Topics Concern  . Not on file   Social History Narrative    FAMILY HISTORY: Family History  Problem Relation Age of Onset  . Anemia Sister   . Asthma Mother   . Ulcers Father     ALLERGIES:  has No Known Allergies.  MEDICATIONS:  No current facility-administered medications for this visit.   Current Outpatient Prescriptions  Medication Sig Dispense Refill  . [DISCONTINUED] potassium chloride (K-DUR) 10 MEQ tablet TAKE ONE TABLET BY MOUTH TWICE DAILY 60 tablet 0   Facility-Administered Medications Ordered in Other Visits  Medication Dose Route Frequency Provider Last Rate Last Dose  . 0.9 %  sodium chloride infusion  250 mL Intravenous PRN Samella Parr, NP      . 0.9 %  sodium chloride infusion   Intravenous Once Jonetta Osgood, MD      . acetaminophen (TYLENOL) tablet 650 mg  650 mg Oral Q4H PRN Samella Parr, NP      . albuterol (PROVENTIL) (2.5 MG/3ML) 0.083% nebulizer solution 2.5 mg  2.5 mg Nebulization Q2H PRN Jonetta Osgood, MD   2.5 mg at 12/17/15 2201  . antiseptic oral rinse (CPC / CETYLPYRIDINIUM CHLORIDE 0.05%) solution 7 mL  7 mL Mouth Rinse BID Waldemar Dickens, MD   7 mL at 12/17/15 2200  . atorvastatin (LIPITOR) tablet 40 mg  40 mg Oral q1800 Samella Parr, NP   40 mg at 12/17/15 1738  . enoxaparin (LOVENOX) injection 30 mg  30 mg Subcutaneous QHS Samella Parr, NP   30 mg at 12/17/15 2055  . hydrALAZINE (APRESOLINE) injection 10 mg  10 mg Intravenous Q6H PRN Samella Parr, NP      . insulin aspart (novoLOG) injection 0-5 Units  0-5 Units Subcutaneous QHS Samella Parr, NP   0 Units at 12/16/15 2210  . insulin aspart (novoLOG) injection 0-9 Units  0-9 Units Subcutaneous TID WC Samella Parr, NP   5 Units at 12/16/15 1307  . levothyroxine (SYNTHROID, LEVOTHROID) tablet 125 mcg  125 mcg Oral QAC breakfast Samella Parr, NP   Stopped at 12/17/15 0800  .  LORazepam (ATIVAN) injection 0.5 mg  0.5 mg Intravenous Q6H PRN Jonetta Osgood, MD   0.5 mg at 12/17/15 0942  . nitroGLYCERIN 50 mg in dextrose 5 % 250 mL (0.2 mg/mL) infusion  0-200 mcg/min Intravenous Titrated Jonetta Osgood, MD 3 mL/hr at 12/18/15 0442 10 mcg/min at 12/18/15 0442  . ondansetron (ZOFRAN) injection 4 mg  4 mg Intravenous Q6H PRN Samella Parr, NP      . pantoprazole (PROTONIX) EC tablet 20 mg  20 mg Oral Daily Samella Parr, NP   Stopped at 12/17/15 1000  . potassium chloride SA (K-DUR,KLOR-CON) CR tablet 40 mEq  40 mEq  Oral BID Jonetta Osgood, MD   40 mEq at 12/17/15 2055  . sodium chloride flush (NS) 0.9 % injection 3 mL  3 mL Intravenous Q12H Samella Parr, NP   3 mL at 12/17/15 2102  . sodium chloride flush (NS) 0.9 % injection 3 mL  3 mL Intravenous PRN Samella Parr, NP      . traZODone (DESYREL) tablet 50 mg  50 mg Oral QHS Samella Parr, NP   50 mg at 12/17/15 2200    REVIEW OF SYSTEMS:    10 Point review of Systems was done is negative except as noted above.  PHYSICAL EXAMINATION: ECOG PERFORMANCE STATUS: 1 - Symptomatic but completely ambulatory  . Filed Vitals:   12/08/15 1002  BP: 197/52  Pulse: 85  Temp: 97.8 F (36.6 C)  Resp: 18   Filed Weights   12/08/15 1002  Weight: 127 lb 8 oz (57.834 kg)   .Body mass index is 24.1 kg/(m^2).  GENERAL:alert, in no acute distress and comfortable SKIN: skin color, texture, turgor are normal, no rashes or significant lesions EYES: normal, conjunctiva are pink and non-injected, sclera clear OROPHARYNX:no exudate, no erythema and lips, buccal mucosa, and tongue normal  NECK: supple, no JVD, thyroid normal size, non-tender, without nodularity LYMPH:  no palpable lymphadenopathy in the cervical, axillary or inguinal LUNGS: clear to auscultation with normal respiratory effort HEART: regular rate & rhythm,  no murmurs and no lower extremity edema ABDOMEN: abdomen soft, non-tender, normoactive bowel  sounds  Musculoskeletal: no cyanosis of digits and no clubbing  PSYCH: alert & oriented x 3 with fluent speech NEURO: no focal motor/sensory deficits  LABORATORY DATA:  I have reviewed the data as listed Component     Latest Ref Rng 12/08/2015  WBC     3.9 - 10.3 10e3/uL 5.1  NEUT#     1.5 - 6.5 10e3/uL 3.5  Hemoglobin     11.6 - 15.9 g/dL 8.8 (L)  HCT     34.8 - 46.6 % 28.9 (L)  Platelets     145 - 400 10e3/uL 379  MCV     79.5 - 101.0 fL 86.8  MCH     25.1 - 34.0 pg 26.4  MCHC     31.5 - 36.0 g/dL 30.4 (L)  RBC     3.70 - 5.45 10e6/uL 3.33 (L)  RDW     11.2 - 14.5 % 17.8 (H)  lymph#     0.9 - 3.3 10e3/uL 0.9  MONO#     0.1 - 0.9 10e3/uL 0.5  Eosinophils Absolute     0.0 - 0.5 10e3/uL 0.2  Basophils Absolute     0.0 - 0.1 10e3/uL 0.0  NEUT%     38.4 - 76.8 % 68.5  LYMPH%     14.0 - 49.7 % 17.4  MONO%     0.0 - 14.0 % 9.4  EOS%     0.0 - 7.0 % 4.1  BASO%     0.0 - 2.0 % 0.6  Retic %     0.70 - 2.10 % 1.35  Retic Ct Abs     33.70 - 90.70 10e3/uL 44.96  Immature Retic Fract     1.60 - 10.00 % 12.90 (H)  Sodium     136 - 145 mEq/L 142  Potassium     3.5 - 5.1 mEq/L 3.8  Chloride     98 - 109 mEq/L 107  CO2     22 - 29  mEq/L 28  Glucose     70 - 140 mg/dl 118  BUN     7.0 - 26.0 mg/dL 17.2  Creatinine     0.6 - 1.1 mg/dL 1.0  Total Bilirubin     0.20 - 1.20 mg/dL 0.53  Alkaline Phosphatase     40 - 150 U/L 65  AST     5 - 34 U/L 16  ALT     0 - 55 U/L <9  Total Protein     6.4 - 8.3 g/dL 6.3 (L)  Albumin     3.5 - 5.0 g/dL 3.1 (L)  Calcium     8.4 - 10.4 mg/dL 8.2 (L)  Anion gap     3 - 11 mEq/L 8  EGFR     >90 ml/min/1.73 m2 55 (L)  Ferritin     9 - 269 ng/ml 18    ASSESSMENT & PLAN:     82 year old Caucasian female with  #1 Microcytic anemia likely related to iron deficiency from chronic GI bleeding. No known disorder causing chronic inflammation at this time. Ferritin down trending from 34 to 18. Recent hemoytic markers  ruled out a hemolytic process.  Recently admitted with symptomatic anemia in early march 2017 and required 4 units of PRBCs  #2 history of recurrent GI bleeding related to upper and lower GI tract AVMs and gastric erosions.  #3 status post acute renal failure in January 2017 currently creatinine within normal limits-  #4 hypothyroidism on levothyroxine .  Plan  -patient is currently off ASA to try to reduce her risk of GI bleeding though she continues to remain at high-risk for GI bleeding given her extensive AVMs. -We would recommend treating her aggressively with IV iron to reduce her transfusion requirements and try to maintain her hemoglobin despite some slow clonic GI losses. -We'll target a ferritin level of more than 100. -Discuss risks and benefits of IV iron and informed consent obtained from the patient. -Patient has been set up for IV Feraheme 569m qweekly x 2 doses. -daily multivitamin orally. -Continue followup with GI for further evaluation and management of AVMs as needed. -Continue close followup with primary care physician for management of other medical issues.   Return to care with Dr. KIrene Limboin 1 months with repeat CBC, CMP and ferritin to reassess status of blood loss and anemia.  All of the patients questions were answered with apparent satisfaction. The patient knows to call the clinic with any problems, questions or concerns.  I spent 25 minutes counseling the patient face to face. The total time spent in the appointment was 25 minutes and more than 50% was on counseling and direct patient cares.    GSullivan LoneMD MGlendaleAAHIVMS SSt Catherine'S West Rehabilitation HospitalCCarolinas Healthcare System Blue RidgeHematology/Oncology Physician CBreckinridge Memorial [facility] (Office):       [phone](Work cell):  [phone](Fax):           [phone]

## 2015-12-18 NOTE — Progress Notes (Addendum)
Cardiologist:  Marlou Porch Subjective:  Breathing is much improved, off BiPAP, on nasal cannula oxygen. No chest pain.  Breathing worsened after fluid administration surrounding iron administration.  Objective:  Vital Signs in the last 24 hours: Temp:  [97.4 F (36.3 C)-98.3 F (36.8 C)] 97.9 F (36.6 C) (03/30 0713) Pulse Rate:  [69-97] 76 (03/30 0713) Resp:  [13-30] 28 (03/30 0713) BP: (111-186)/(46-94) 159/65 mmHg (03/30 0713) SpO2:  [92 %-100 %] 98 % (03/30 0713) Weight:  [125 lb 3.5 oz (56.8 kg)] 125 lb 3.5 oz (56.8 kg) (03/30 0400)  Intake/Output from previous day: 03/29 0701 - 03/30 0700 In: 506.9 [P.O.:120; I.V.:44.4; Blood:342.5] Out: 5700 [Urine:5700]   Physical Exam: General: Well developed, well nourished, in no acute distress. Head:  Normocephalic and atraumatic. Lungs: Improved with minimal rales. Heart: Normal S1 and S2.  No murmur, rubs or gallops.  Abdomen: soft, non-tender, positive bowel sounds. Extremities: No clubbing or cyanosis. Trace edema. Neurologic: Alert and oriented x 3.    Lab Results:  Recent Labs  12/16/15 0835 12/18/15 0810  WBC 9.0 14.7*  HGB 7.4* 8.3*  PLT 326 317    Recent Labs  12/17/15 0500 12/18/15 0429  NA 142 144  K 3.6 3.6  CL 112* 107  CO2 21* 28  GLUCOSE 128* 97  BUN 24* 23*  CREATININE 0.99 1.13*    Recent Labs  12/16/15 2240 12/17/15 0500  TROPONINI 0.15* 0.12*   Hepatic Function Panel  Recent Labs  12/16/15 0835  PROT 6.2*  ALBUMIN 3.0*  AST 20  ALT 8*  ALKPHOS 66  BILITOT 1.0    Imaging: Dg Chest 2 View  12/17/2015  CLINICAL DATA:  Acute CHF, respiratory distress, acute renal injury, diabetes EXAM: CHEST  2 VIEW COMPARISON:  Chest x-ray and chest CT scan of December 16, 2015 FINDINGS: The lungs are adequately inflated. The interstitial markings remain increased bilaterally. The pulmonary vascularity appears less engorged but remains abnormal. The cardiac silhouette remains enlarged. There is  left lower lobe atelectasis. There small bilateral pleural effusions. The bony thorax exhibits no acute abnormalities. There degenerative changes of both shoulders. IMPRESSION: Improving CHF. Persistent bilateral interstitial edema, small pleural effusions, and left lower lobe atelectasis. Electronically Signed   By: [name]  Martinique M.D.   On: 12/17/2015 07:47   Ct Chest Wo Contrast  12/16/2015  CLINICAL DATA:  Recent GI bleeding and blood transfusion yesterday. Hypoxia. EXAM: CT CHEST WITHOUT CONTRAST TECHNIQUE: Multidetector CT imaging of the chest was performed following the standard protocol without IV contrast. COMPARISON:  None. FINDINGS: THORACIC INLET/BODY WALL: Body wall edema. MEDIASTINUM: Normal heart size. Trace pericardial fluid. Atherosclerosis, including throughout the coronary arteries. No acute vascular finding. No adenopathy. LUNG WINDOWS: Small layering bilateral pleural effusion with multi segment lower lobe opacification. Bronchial crowding is compatible of atelectasis. No septal thickening to suggest interstitial edema. No consolidation. UPPER ABDOMEN: No acute findings. OSSEOUS: Advanced osteopenia. Diffuse disc degeneration and advanced right glenohumeral osteoarthritis. T12 moderate compression deformity, chronic IMPRESSION: 1. Volume overload with body wall edema and small pleural effusions. No pulmonary edema. 2. Multi segment lower lobe atelectasis. Electronically Signed   By: Monte Fantasia M.D.   On: 12/16/2015 18:21   Personally viewed.   Telemetry: No adverse arrhythmias Personally viewed.   EKG:  Normal sinus rhythm, poor R-wave progression Personally viewed.  Cardiac Studies:  EF 35-40%  Meds: Scheduled Meds: . sodium chloride   Intravenous Once  . antiseptic oral rinse  7 mL Mouth Rinse BID  .  atorvastatin  40 mg Oral q1800  . carvedilol  6.25 mg Oral BID WC  . enoxaparin (LOVENOX) injection  30 mg Subcutaneous QHS  . furosemide  40 mg Intravenous BID  . insulin  aspart  0-5 Units Subcutaneous QHS  . insulin aspart  0-9 Units Subcutaneous TID WC  . isosorbide mononitrate  30 mg Oral Daily  . levothyroxine  125 mcg Oral QAC breakfast  . pantoprazole  20 mg Oral Daily  . potassium chloride  40 mEq Oral BID  . sodium chloride flush  3 mL Intravenous Q12H  . traZODone  50 mg Oral QHS   Continuous Infusions:  PRN Meds:.sodium chloride, acetaminophen, albuterol, hydrALAZINE, LORazepam, ondansetron (ZOFRAN) IV, sodium chloride flush  Assessment/Plan:  Principal Problem:   Acute congestive heart failure ([name]) Active Problems:   Hypertension associated with diabetes ([place])   Hypothyroidism   Hyperlipidemia associated with type 2 diabetes mellitus ([name])   Angiodysplasia of stomach and duodenum with hemorrhage   Iron deficiency anemia secondary to blood loss (chronic)   Acute respiratory distress ([name])   AKI (acute kidney injury) ([place])  82 year old female with newly discovered ejection fraction [id number], acute systolic heart failure/cardiomyopathy, resolved acute respiratory failure, iron deficiency anemia secondary to previously described gastric AVMs without any other obvious source.  Acute systolic heart failure -EF 35% newly discovered with no prior history of MI, vague chest pain previously described. -Given her mildly elevated troponin which is likely demand ischemia in the setting of acute respiratory failure/acute systolic heart failure and to exclude the possibility of severe triple-vessel coronary artery disease, I would like to proceed with diagnostic coronary angiography tomorrow. Her breathing is much improved. Risks and benefits of procedure including stroke, heart attack, death, renal impairment have been discussed. We will give her another day of IV Lasix. -I will add isosorbide 30 mg. I will hold off on adding ACE inhibitor until after cardiac catheterization. Continue with low-dose carvedilol. I will stop IV nitroglycerin.  Iron deficiency  anemia/GI source unknown, gastric AVMs -Iron IV beneficial for her, we will just make sure that she is on furosemide. -Oncology consultation reviewed.  Elevated troponin -0.12, likely demand as above.  Acute respiratory failure resolved -no longer on BiPAP.  She seems stable enough to be transferred to telemetry at this point.  Await cardiac catheterization tomorrow. Because of her prior bleeding issues, we will have to carefully weigh the risk versus benefit of potential percutaneous intervention. My main goal is to exclude the possibility of triple-vessel coronary artery disease hence the need for bypass surgery.     SKAINS, [place] 12/18/2015, 10:30 AM

## 2015-12-18 NOTE — Progress Notes (Signed)
PATIENT DETAILS Name: Connie [name] Age: 82 y.o. Sex: female Date of Birth: Mar 20, 1934 Admit Date: 12/16/2015 Admitting Physician Samella Parr, NP [id number] Juanda Crumble, MD  Subjective: SOB much better  Assessment/Plan: Principal Problem: Acute hypoxic respiratory failure: Suspect secondary to acute decompensated CHF-briefly required BIPAP yesterday.Much improved with IV Lasix and now on High flow [place] 3/l/,  Active Problems: Acute systolic [id number] improved, Continue Coreg and Imdur.Will give 1 dose of IV Lasix this am-and then hold as creatinine slightly elevated-will undergo LHC. Titrate off NTG infusion. Plan is to start ACEI after LHC. -5.4 L so far, weight decreased to 125 lbs. TTE on 3/29 EF around 35%. Cards following, plans for [facility] 3/31 to see if patient has triple vessel disease for which patient will need CABG. Unfortunately given chronic GI bleeding, not a good candidate for PCI as need for dual anti-platelet agents.  Minimally elevated troponins: Trend is flat, not consistent with ACS. Likely demand ischemia in the setting of above.Cards following-LHC 3/31   Elevated d-dimer: Doubt pulmonary embolism given findings of rales/rhonchi on exam-lower extremity Dopplers negative. Doubt further is required. Poor long-term candidate for anticoagulation given chronic GI bleeding.  Elevated lactic acid: Clinical picture is not consistent with sepsis-although upon review has some infiltrates vs atelectasis on CT chest-but no fever or leukocytosis. I suspect elevated lactic acid could be from CHF and resultant tissue hypoxia. For now suspect we could monitor her off antibiotics.  History of chronic GI bleeding (history of AVMs) and iron deficiency anemia: Transfused 1 unit of PRBC on 3/29 as hemoglobin down to 7.4- no overt bleeding evident-reviewed prior GI consultation records-has had extensive endoscopic procedures in the past. Hb stable at 8.Just received IV  iron on 3/27-suspect will likely need another dosing of IV Iron prior to d/c  Type 2 diabetes: CBGs relatively stable, on oral hypoglycemic agents on hold. Continue SSI. Follow  Elevated d-dimer: Doubt pulmonary embolism given findings of rales/rhonchi on exam-lower extremity Dopplers negative. Doubt further is required. Poor long-term candidate for anticoagulation given chronic GI bleeding.  Hypothyroidism: Continue Synthroid-TSH elevated at 7.2-not sure when last dosing was adjusted. Suspect further adjustment could be done in the outpatient setting.  Dyslipidemia: Continue statin.  Disposition: Remain inpatient-remain in SDU  Antimicrobial agents  See below  Anti-infectives    None      DVT Prophylaxis: SCD's-given hx of chronic GI bleed  Code Status: Full code   Family Communication None at bedside  Procedures: None  CONSULTS:  None  Time spent 25 minutes-Greater than 50% of this time was spent in counseling, explanation of diagnosis, planning of further management, and coordination of care.  MEDICATIONS: Scheduled Meds: . sodium chloride   Intravenous Once  . antiseptic oral rinse  7 mL Mouth Rinse BID  . atorvastatin  40 mg Oral q1800  . carvedilol  6.25 mg Oral BID WC  . enoxaparin (LOVENOX) injection  30 mg Subcutaneous QHS  . furosemide  40 mg Intravenous BID  . insulin aspart  0-5 Units Subcutaneous QHS  . insulin aspart  0-9 Units Subcutaneous TID WC  . isosorbide mononitrate  30 mg Oral Daily  . levothyroxine  125 mcg Oral QAC breakfast  . pantoprazole  20 mg Oral Daily  . potassium chloride  40 mEq Oral BID  . sodium chloride flush  3 mL Intravenous Q12H  . traZODone  50 mg Oral QHS   Continuous  Infusions:  PRN Meds:.sodium chloride, acetaminophen, albuterol, hydrALAZINE, LORazepam, ondansetron (ZOFRAN) IV, sodium chloride flush    PHYSICAL EXAM: Vital signs in last 24 hours: Filed Vitals:   12/18/15 0400 12/18/15 0600 12/18/15 0713  12/18/15 1230  BP: 154/60 149/60 159/65 134/47  Pulse: 82 78 76 72  Temp: 98 F (36.7 C)  97.9 F (36.6 C) 98 F (36.7 C)  TempSrc: Oral  Oral Oral  Resp: 20 18 28 17   Height: 5\' 2"  (1.575 m)     Weight: 56.8 kg (125 lb 3.5 oz)     SpO2: 96% 97% 98% 100%    Weight change: 4.2 kg (9 lb 4.2 oz) Filed Weights   12/17/15 0410 12/17/15 0751 12/18/15 0400  Weight: 59 kg (130 lb 1.1 oz) 60.9 kg (134 lb 4.2 oz) 56.8 kg (125 lb 3.5 oz)   Body mass index is 22.9 kg/(m^2).   Gen Exam: Awake and alert-no longer SOB. Neck: Supple Chest: B/L clear CVS: S1 S2 Regular Abdomen: soft, BS +, non tender, non distended. Extremities: + edema, lower extremities warm to touch. Neurologic: Non Focal.  Skin: No Rash.   Wounds: N/A.   Intake/Output from previous day:  Intake/Output Summary (Last 24 hours) at 12/18/15 1323 Last data filed at 12/18/15 1230  Gross per 24 hour  Intake 354.92 ml  Output   4750 ml  Net -4395.08 ml     LAB RESULTS: CBC  Recent Labs Lab 12/16/15 0835 12/18/15 0810  WBC 9.0 14.7*  HGB 7.4* 8.3*  HCT 24.3* 27.1*  PLT 326 317  MCV 85.9 83.9  MCH 26.1 25.7*  MCHC 30.5 30.6  RDW 17.4* 17.2*  LYMPHSABS 1.0  --   MONOABS 0.4  --   EOSABS 0.1  --   BASOSABS 0.0  --     Chemistries   Recent Labs Lab 12/16/15 0835 12/16/15 1609 12/17/15 0500 12/18/15 0429  NA 141 142 142 144  K 3.6 3.1* 3.6 3.6  CL 108 108 112* 107  CO2 23 22 21* 28  GLUCOSE 272* 258* 128* 97  BUN 23* 24* 24* 23*  CREATININE 1.04* 1.22* 0.99 1.13*  CALCIUM 8.5* 8.8* 8.1* 8.3*    CBG:  Recent Labs Lab 12/17/15 0734 12/17/15 1126 12/17/15 1753 12/17/15 2137 12/18/15 0740  GLUCAP 109* 108* 102* 156* 92    GFR Estimated Creatinine Clearance: 31.4 mL/min (by C-G formula based on Cr of 1.13).  Coagulation profile No results for input(s): INR, PROTIME in the last 168 hours.  Cardiac Enzymes  Recent Labs Lab 12/16/15 1609 12/16/15 2240 12/17/15 0500  TROPONINI  0.17* 0.15* 0.12*    Invalid input(s): POCBNP  Recent Labs  12/16/15 0835  DDIMER 2.17*    Recent Labs  12/16/15 1030  HGBA1C 6.1*   No results for input(s): CHOL, HDL, LDLCALC, TRIG, CHOLHDL, LDLDIRECT in the last 72 hours.  Recent Labs  12/17/15 0500  TSH 7.264*   No results for input(s): VITAMINB12, FOLATE, FERRITIN, TIBC, IRON, RETICCTPCT in the last 72 hours. No results for input(s): LIPASE, AMYLASE in the last 72 hours.  Urine Studies No results for input(s): UHGB, CRYS in the last 72 hours.  Invalid input(s): UACOL, UAPR, USPG, UPH, UTP, UGL, UKET, UBIL, UNIT, UROB, ULEU, UEPI, UWBC, URBC, UBAC, CAST, UCOM, BILUA  MICROBIOLOGY: Recent Results (from the past 240 hour(s))  Culture, blood (Routine X 2) w Reflex to ID Panel     Status: None (Preliminary result)   Collection Time: 12/16/15  6:42 PM  Result Value Ref Range Status   Specimen Description BLOOD RIGHT HAND  Final   Special Requests BOTTLES DRAWN AEROBIC ONLY 5CC  Final   Culture NO GROWTH < 24 HOURS  Final   Report Status PENDING  Incomplete  Culture, blood (Routine X 2) w Reflex to ID Panel     Status: None (Preliminary result)   Collection Time: 12/16/15  6:49 PM  Result Value Ref Range Status   Specimen Description BLOOD RIGHT ANTECUBITAL  Final   Special Requests BOTTLES DRAWN AEROBIC AND ANAEROBIC 5CC  Final   Culture NO GROWTH < 24 HOURS  Final   Report Status PENDING  Incomplete  MRSA PCR Screening     Status: None   Collection Time: 12/16/15 10:06 PM  Result Value Ref Range Status   MRSA by PCR NEGATIVE NEGATIVE Final    Comment:        The GeneXpert MRSA Assay (FDA approved for NASAL specimens only), is one component of a comprehensive MRSA colonization surveillance program. It is not intended to diagnose MRSA infection nor to guide or monitor treatment for MRSA infections.     RADIOLOGY STUDIES/RESULTS: Dg Chest 2 View  12/17/2015  CLINICAL DATA:  Acute CHF, respiratory  distress, acute renal injury, diabetes EXAM: CHEST  2 VIEW COMPARISON:  Chest x-ray and chest CT scan of December 16, 2015 FINDINGS: The lungs are adequately inflated. The interstitial markings remain increased bilaterally. The pulmonary vascularity appears less engorged but remains abnormal. The cardiac silhouette remains enlarged. There is left lower lobe atelectasis. There small bilateral pleural effusions. The bony thorax exhibits no acute abnormalities. There degenerative changes of both shoulders. IMPRESSION: Improving CHF. Persistent bilateral interstitial edema, small pleural effusions, and left lower lobe atelectasis. Electronically Signed   By: [name]  Martinique M.D.   On: 12/17/2015 07:47   Ct Chest Wo Contrast  12/16/2015  CLINICAL DATA:  Recent GI bleeding and blood transfusion yesterday. Hypoxia. EXAM: CT CHEST WITHOUT CONTRAST TECHNIQUE: Multidetector CT imaging of the chest was performed following the standard protocol without IV contrast. COMPARISON:  None. FINDINGS: THORACIC INLET/BODY WALL: Body wall edema. MEDIASTINUM: Normal heart size. Trace pericardial fluid. Atherosclerosis, including throughout the coronary arteries. No acute vascular finding. No adenopathy. LUNG WINDOWS: Small layering bilateral pleural effusion with multi segment lower lobe opacification. Bronchial crowding is compatible of atelectasis. No septal thickening to suggest interstitial edema. No consolidation. UPPER ABDOMEN: No acute findings. OSSEOUS: Advanced osteopenia. Diffuse disc degeneration and advanced right glenohumeral osteoarthritis. T12 moderate compression deformity, chronic IMPRESSION: 1. Volume overload with body wall edema and small pleural effusions. No pulmonary edema. 2. Multi segment lower lobe atelectasis. Electronically Signed   By: Monte Fantasia M.D.   On: 12/16/2015 18:21   Dg Chest Portable 1 View  12/16/2015  CLINICAL DATA:  Shortness of Breath EXAM: PORTABLE CHEST 1 VIEW COMPARISON:  October 10, 2015 FINDINGS: There is generalized interstitial edema with patchy alveolar edema in the bases as well. There are small pleural effusions bilaterally. There is cardiomegaly with pulmonary venous hypertension. No adenopathy evident. There is atherosclerotic calcification in the aorta. There is postoperative change in the right shoulder. IMPRESSION: Changes consistent with congestive heart failure. Electronically Signed   By: Lowella Grip III M.D.   On: 12/16/2015 09:08    Oren Binet, MD  [facility] Pager:[phone]  If 7PM-7AM, please contact night-coverage [email or web address] Password [email or web address] 12/18/2015, 1:23 PM   LOS: 2 days

## 2015-12-18 NOTE — Progress Notes (Signed)
Provided pt with Living with Heart Failure book.  Discussed education materials in book and lifestyle changes.  Will continue to provide education.

## 2015-12-18 NOTE — Care Management Important Message (Signed)
Important Message  Patient Details  Name: Connie [name] MRN: [id number] Date of Birth: [birth year]   Medicare Important Message Given:  Yes    Yessika Otte Abena 12/18/2015, 11:29 AM

## 2015-12-19 ENCOUNTER — Encounter (HOSPITAL_COMMUNITY)
Admission: EM | Disposition: A | Discharge status: Home / Self Care | Payer: Self-pay | Source: Home / Self Care | Attending: Internal Medicine

## 2015-12-19 DIAGNOSIS — R7989 Other specified abnormal findings of blood chemistry: Secondary | ICD-10-CM

## 2015-12-19 DIAGNOSIS — N179 Acute kidney failure, unspecified: Secondary | ICD-10-CM

## 2015-12-19 DIAGNOSIS — R778 Other specified abnormalities of plasma proteins: Secondary | ICD-10-CM | POA: Diagnosis present

## 2015-12-19 HISTORY — PX: CARDIAC CATHETERIZATION: SHX172

## 2015-12-19 LAB — BASIC METABOLIC PANEL
Anion gap: 8 (ref 5–15)
BUN: 25 mg/dL — ABNORMAL HIGH (ref 6–20)
CO2: 26 mmol/L (ref 22–32)
CREATININE: 1.51 mg/dL — AB (ref 0.44–1.00)
Calcium: 8.4 mg/dL — ABNORMAL LOW (ref 8.9–10.3)
Chloride: 106 mmol/L (ref 101–111)
GFR calc non Af Amer: 31 mL/min — ABNORMAL LOW (ref >60)
GFR, EST AFRICAN AMERICAN: 36 mL/min — AB (ref >60)
GLUCOSE: 106 mg/dL — AB (ref 65–99)
Potassium: 4.3 mmol/L (ref 3.5–5.1)
Sodium: 140 mmol/L (ref 135–145)

## 2015-12-19 LAB — PROTIME-INR
INR: 1.08 (ref 0.00–1.49)
PROTHROMBIN TIME: 14.2 s (ref 11.6–15.2)

## 2015-12-19 LAB — GLUCOSE, CAPILLARY
GLUCOSE-CAPILLARY: 102 mg/dL — AB (ref 65–99)
GLUCOSE-CAPILLARY: 163 mg/dL — AB (ref 65–99)
Glucose-Capillary: 106 mg/dL — ABNORMAL HIGH (ref 65–99)
Glucose-Capillary: 97 mg/dL (ref 65–99)

## 2015-12-19 LAB — CBC
HCT: 27.1 % — ABNORMAL LOW (ref 36.0–46.0)
Hemoglobin: 8.2 g/dL — ABNORMAL LOW (ref 12.0–15.0)
MCH: 25.7 pg — ABNORMAL LOW (ref 26.0–34.0)
MCHC: 30.3 g/dL (ref 30.0–36.0)
MCV: 85 fL (ref 78.0–100.0)
PLATELETS: 324 10*3/uL (ref 150–400)
RBC: 3.19 MIL/uL — ABNORMAL LOW (ref 3.87–5.11)
RDW: 17.3 % — ABNORMAL HIGH (ref 11.5–15.5)
WBC: 10.4 10*3/uL (ref 4.0–10.5)

## 2015-12-19 SURGERY — LEFT HEART CATH AND CORONARY ANGIOGRAPHY

## 2015-12-19 MED ORDER — HEPARIN SODIUM (PORCINE) 1000 UNIT/ML IJ SOLN
INTRAMUSCULAR | Status: DC | PRN
  Administered 2015-12-19: 3000 [IU] via INTRAVENOUS

## 2015-12-19 MED ORDER — HEPARIN SODIUM (PORCINE) 1000 UNIT/ML IJ SOLN
INTRAMUSCULAR | Status: AC
  Filled 2015-12-19: qty 1

## 2015-12-19 MED ORDER — IOPAMIDOL (ISOVUE-370) INJECTION 76%
INTRAVENOUS | Status: DC | PRN
  Administered 2015-12-19: 90 mL via INTRAVENOUS

## 2015-12-19 MED ORDER — IOPAMIDOL (ISOVUE-370) INJECTION 76%
INTRAVENOUS | Status: AC
  Filled 2015-12-19: qty 100

## 2015-12-19 MED ORDER — VERAPAMIL HCL 2.5 MG/ML IV SOLN
INTRAVENOUS | Status: DC | PRN
  Administered 2015-12-19: 10 mL via INTRA_ARTERIAL

## 2015-12-19 MED ORDER — HEPARIN (PORCINE) IN NACL 2-0.9 UNIT/ML-% IJ SOLN
INTRAMUSCULAR | Status: DC | PRN
  Administered 2015-12-19: 1000 mL

## 2015-12-19 MED ORDER — SODIUM CHLORIDE 0.9 % IV SOLN
INTRAVENOUS | Status: AC
  Administered 2015-12-19: 75 mL/h via INTRAVENOUS

## 2015-12-19 MED ORDER — MIDAZOLAM HCL 2 MG/2ML IJ SOLN
INTRAMUSCULAR | Status: DC | PRN
  Administered 2015-12-19: 1 mg via INTRAVENOUS

## 2015-12-19 MED ORDER — NITROGLYCERIN 1 MG/10 ML FOR IR/CATH LAB
INTRA_ARTERIAL | Status: AC
  Filled 2015-12-19: qty 10

## 2015-12-19 MED ORDER — SODIUM CHLORIDE 0.9% FLUSH
3.0000 mL | Freq: Two times a day (BID) | INTRAVENOUS | Status: DC
  Administered 2015-12-19 – 2015-12-20 (×3): 3 mL via INTRAVENOUS

## 2015-12-19 MED ORDER — HEPARIN (PORCINE) IN NACL 2-0.9 UNIT/ML-% IJ SOLN
INTRAMUSCULAR | Status: AC
  Filled 2015-12-19: qty 1000

## 2015-12-19 MED ORDER — MIDAZOLAM HCL 2 MG/2ML IJ SOLN
INTRAMUSCULAR | Status: AC
  Filled 2015-12-19: qty 2

## 2015-12-19 MED ORDER — FENTANYL CITRATE (PF) 100 MCG/2ML IJ SOLN
INTRAMUSCULAR | Status: DC | PRN
  Administered 2015-12-19: 25 ug via INTRAVENOUS

## 2015-12-19 MED ORDER — LIDOCAINE HCL (PF) 1 % IJ SOLN
INTRAMUSCULAR | Status: AC
  Filled 2015-12-19: qty 30

## 2015-12-19 MED ORDER — LIDOCAINE HCL (PF) 1 % IJ SOLN
INTRAMUSCULAR | Status: DC | PRN
  Administered 2015-12-19: 4 mL

## 2015-12-19 MED ORDER — SODIUM CHLORIDE 0.9 % IV SOLN: 250.0000 mL | INTRAVENOUS | Status: DC | PRN

## 2015-12-19 MED ORDER — VERAPAMIL HCL 2.5 MG/ML IV SOLN
INTRAVENOUS | Status: AC
  Filled 2015-12-19: qty 2

## 2015-12-19 MED ORDER — SODIUM CHLORIDE 0.9% FLUSH: 3.0000 mL | INTRAVENOUS | Status: DC | PRN

## 2015-12-19 MED ORDER — SODIUM CHLORIDE 0.9 % WEIGHT BASED INFUSION: 1.0000 mL/kg/h | INTRAVENOUS | Status: AC

## 2015-12-19 MED ORDER — FENTANYL CITRATE (PF) 100 MCG/2ML IJ SOLN
INTRAMUSCULAR | Status: AC
  Filled 2015-12-19: qty 2

## 2015-12-19 SURGICAL SUPPLY — 10 items
CATH INFINITI 5FR ANG PIGTAIL (CATHETERS) ×2 IMPLANT
CATH OPTITORQUE TIG 4.0 5F (CATHETERS) ×2 IMPLANT
DEVICE RAD COMP TR BAND LRG (VASCULAR PRODUCTS) ×2 IMPLANT
GLIDESHEATH SLEND A-KIT 6F 22G (SHEATH) ×2 IMPLANT
KIT HEART LEFT (KITS) ×3 IMPLANT
PACK CARDIAC CATHETERIZATION (CUSTOM PROCEDURE TRAY) ×3 IMPLANT
SYR MEDRAD MARK V 150ML (SYRINGE) ×3 IMPLANT
TRANSDUCER W/STOPCOCK (MISCELLANEOUS) ×3 IMPLANT
TUBING CIL FLEX 10 FLL-RA (TUBING) ×3 IMPLANT
WIRE SAFE-T 1.5MM-J .035X260CM (WIRE) ×2 IMPLANT

## 2015-12-19 NOTE — Progress Notes (Addendum)
PATIENT DETAILS Name: Connie [name] Age: 82 y.o. Sex: female Date of Birth: April 18, 1934 Admit Date: 12/16/2015 Admitting Physician Samella Parr, NP [id number] Juanda Crumble, MD  Brief narrative: 82 year old female with history of chronic GI bleeding and resultant iron deficiency anemia admitted on 3/28 with acute hypoxic respiratory failure secondary to new onset decompensated systolic heart failure. Significantly more compensated following IV Lasix, cardiology consulted, plans are for left heart catheterization 3/31.  Subjective: Significantly improved-no further shortness of breath  Assessment/Plan: Principal Problem: Acute hypoxic respiratory failure: Secondary to acute systolic heart failure-much improved following IV Lasix-initially required BiPAP, now on room air.  Active Problems: Acute systolic CHF:TTE on [id number] EF around 35%.Much compensated following IV Lasix. Continue Coreg and Imdur. Seen by cardiology, recommendations are to proceed with a LHC primarily to rule out the possibility of triple vessel disease. Given ongoing chronic GI bleeding, will be a poor candidate for PCI and long-term dual antiplatelet agents. -6.5 L so far, weight down to 124 pounds-claims baseline rate around 120 pounds. Lasix currently on hold as creatinine slightly elevated and LHC scheduled later today, depending on renal function-will need to be resumed 4/1.  AKI: Likely secondary to diuretics, Lasix currently on hold-gentle hydration in progress-as patient is scheduled for a LHC today.  Minimally elevated troponins: Trend is flat, not consistent with ACS. Likely demand ischemia in the setting of above.Cards following-LHC 3/31   Elevated d-dimer: Doubt pulmonary embolism given findings of rales/rhonchi on exam-lower extremity Dopplers negative. Doubt further is required-as significantly improved following IV Lasix. Poor long-term candidate for anticoagulation given chronic GI  bleeding.  Elevated lactic acid: Clinical picture is not consistent with sepsis-although upon review has some infiltrates vs atelectasis on CT chest-but no fever or leukocytosis. I suspect elevated lactic acid could be from CHF and resultant tissue hypoxia. For now suspect we could monitor her off antibiotics.  History of chronic GI bleeding (history of AVMs) and iron deficiency anemia: Transfused 1 unit of PRBC on 3/29 as hemoglobin down to 7.4- no overt bleeding evident-reviewed prior GI consultation records-has had extensive endoscopic procedures in the past. Hb stable at 8.2.Just received IV iron on 3/27-suspect will likely need another dosing of IV Iron prior to d/c  Type 2 diabetes: CBGs relatively stable, on oral hypoglycemic agents on hold. Continue SSI. Follow  Elevated d-dimer: Doubt pulmonary embolism given findings of rales/rhonchi on exam-lower extremity Dopplers negative. Doubt further is required. Poor long-term candidate for anticoagulation given chronic GI bleeding.  Hypothyroidism: Continue Synthroid-TSH elevated at 7.2-not sure when last dosing was adjusted. Suspect further adjustment could be done in the outpatient setting.  Dyslipidemia: Continue statin.  Disposition: Remain inpatient-remain in SDU till [facility] is completed-suspect can be downgraded to telemetry later.  Antimicrobial agents  See below  Anti-infectives    None      DVT Prophylaxis: SCD's-given hx of chronic GI bleed  Code Status: Full code   Family Communication Daughter in law at bedside  Procedures: None  CONSULTS:  None  Time spent 25 minutes-Greater than 50% of this time was spent in counseling, explanation of diagnosis, planning of further management, and coordination of care.  MEDICATIONS: Scheduled Meds: . sodium chloride   Intravenous Once  . antiseptic oral rinse  7 mL Mouth Rinse BID  . atorvastatin  40 mg Oral q1800  . carvedilol  6.25 mg Oral BID WC  . enoxaparin (LOVENOX)  injection  30  mg Subcutaneous QHS  . insulin aspart  0-5 Units Subcutaneous QHS  . insulin aspart  0-9 Units Subcutaneous TID WC  . isosorbide mononitrate  30 mg Oral Daily  . levothyroxine  125 mcg Oral QAC breakfast  . pantoprazole  20 mg Oral Daily  . potassium chloride  40 mEq Oral BID  . sodium chloride flush  3 mL Intravenous Q12H  . sodium chloride flush  3 mL Intravenous Q12H  . traZODone  50 mg Oral QHS   Continuous Infusions: . sodium chloride 10 mL/hr at 12/19/15 0535  . sodium chloride     PRN Meds:.sodium chloride, sodium chloride, acetaminophen, albuterol, hydrALAZINE, LORazepam, ondansetron (ZOFRAN) IV, sodium chloride flush, sodium chloride flush    PHYSICAL EXAM: Vital signs in last 24 hours: Filed Vitals:   12/19/15 0341 12/19/15 0700 12/19/15 0735 12/19/15 1150  BP: 144/47  148/60 149/54  Pulse: 73  72 74  Temp: 97.6 F (36.4 C) 98.5 F (36.9 C)    TempSrc: Oral Oral    Resp: 19  19 22   Height:      Weight: 56.6 kg (124 lb 12.5 oz)     SpO2: 92%  95% 96%    Weight change: -4.5 kg (-9 lb 14.7 oz) Filed Weights   12/18/15 0400 12/18/15 2026 12/19/15 0341  Weight: 56.8 kg (125 lb 3.5 oz) 56.4 kg (124 lb 5.4 oz) 56.6 kg (124 lb 12.5 oz)   Body mass index is 22.82 kg/(m^2).   Gen Exam: Awake andAlert- Not in any distress Neck: Supple Chest: B/L clear CVS: S1 S2 Regular Abdomen: soft, BS +, non tender, non distended. Extremities: + edema, lower extremities warm to touch. Neurologic: Non Focal.  Skin: No Rash.   Wounds: N/A.   Intake/Output from previous day:  Intake/Output Summary (Last 24 hours) at 12/19/15 1332 Last data filed at 12/19/15 1150  Gross per 24 hour  Intake  302.5 ml  Output   1450 ml  Net -1147.5 ml     LAB RESULTS: CBC  Recent Labs Lab 12/16/15 0835 12/18/15 0810 12/19/15 0420  WBC 9.0 14.7* 10.4  HGB 7.4* 8.3* 8.2*  HCT 24.3* 27.1* 27.1*  PLT 326 317 324  MCV 85.9 83.9 85.0  MCH 26.1 25.7* 25.7*  MCHC 30.5  30.6 30.3  RDW 17.4* 17.2* 17.3*  LYMPHSABS 1.0  --   --   MONOABS 0.4  --   --   EOSABS 0.1  --   --   BASOSABS 0.0  --   --     Chemistries   Recent Labs Lab 12/16/15 0835 12/16/15 1609 12/17/15 0500 12/18/15 0429 12/19/15 0420  NA 141 142 142 144 140  K 3.6 3.1* 3.6 3.6 4.3  CL 108 108 112* 107 106  CO2 23 22 21* 28 26  GLUCOSE 272* 258* 128* 97 106*  BUN 23* 24* 24* 23* 25*  CREATININE 1.04* 1.22* 0.99 1.13* 1.51*  CALCIUM 8.5* 8.8* 8.1* 8.3* 8.4*    CBG:  Recent Labs Lab 12/18/15 0740 12/18/15 1649 12/18/15 2108 12/19/15 0735 12/19/15 1150  GLUCAP 92 108* 95 102* 106*    GFR Estimated Creatinine Clearance: 23.5 mL/min (by C-G formula based on Cr of 1.51).  Coagulation profile  Recent Labs Lab 12/19/15 0511  INR 1.08    Cardiac Enzymes  Recent Labs Lab 12/16/15 1609 12/16/15 2240 12/17/15 0500  TROPONINI 0.17* 0.15* 0.12*    Invalid input(s): POCBNP No results for input(s): DDIMER in the last 72 hours.  No results for input(s): HGBA1C in the last 72 hours. No results for input(s): CHOL, HDL, LDLCALC, TRIG, CHOLHDL, LDLDIRECT in the last 72 hours.  Recent Labs  12/17/15 0500  TSH 7.264*   No results for input(s): VITAMINB12, FOLATE, FERRITIN, TIBC, IRON, RETICCTPCT in the last 72 hours. No results for input(s): LIPASE, AMYLASE in the last 72 hours.  Urine Studies No results for input(s): UHGB, CRYS in the last 72 hours.  Invalid input(s): UACOL, UAPR, USPG, UPH, UTP, UGL, UKET, UBIL, UNIT, UROB, ULEU, UEPI, UWBC, URBC, UBAC, CAST, UCOM, BILUA  MICROBIOLOGY: Recent Results (from the past 240 hour(s))  Culture, blood (Routine X 2) w Reflex to ID Panel     Status: None (Preliminary result)   Collection Time: 12/16/15  6:42 PM  Result Value Ref Range Status   Specimen Description BLOOD RIGHT HAND  Final   Special Requests BOTTLES DRAWN AEROBIC ONLY 5CC  Final   Culture NO GROWTH 3 DAYS  Final   Report Status PENDING  Incomplete    Culture, blood (Routine X 2) w Reflex to ID Panel     Status: None (Preliminary result)   Collection Time: 12/16/15  6:49 PM  Result Value Ref Range Status   Specimen Description BLOOD RIGHT ANTECUBITAL  Final   Special Requests BOTTLES DRAWN AEROBIC AND ANAEROBIC 5CC  Final   Culture NO GROWTH 3 DAYS  Final   Report Status PENDING  Incomplete  MRSA PCR Screening     Status: None   Collection Time: 12/16/15 10:06 PM  Result Value Ref Range Status   MRSA by PCR NEGATIVE NEGATIVE Final    Comment:        The GeneXpert MRSA Assay (FDA approved for NASAL specimens only), is one component of a comprehensive MRSA colonization surveillance program. It is not intended to diagnose MRSA infection nor to guide or monitor treatment for MRSA infections.   Urine culture     Status: None   Collection Time: 12/16/15 11:46 PM  Result Value Ref Range Status   Specimen Description URINE, CLEAN CATCH  Final   Special Requests NONE  Final   Culture MULTIPLE SPECIES PRESENT, SUGGEST RECOLLECTION  Final   Report Status 12/18/2015 FINAL  Final    RADIOLOGY STUDIES/RESULTS: Dg Chest 2 View  12/17/2015  CLINICAL DATA:  Acute CHF, respiratory distress, acute renal injury, diabetes EXAM: CHEST  2 VIEW COMPARISON:  Chest x-ray and chest CT scan of December 16, 2015 FINDINGS: The lungs are adequately inflated. The interstitial markings remain increased bilaterally. The pulmonary vascularity appears less engorged but remains abnormal. The cardiac silhouette remains enlarged. There is left lower lobe atelectasis. There small bilateral pleural effusions. The bony thorax exhibits no acute abnormalities. There degenerative changes of both shoulders. IMPRESSION: Improving CHF. Persistent bilateral interstitial edema, small pleural effusions, and left lower lobe atelectasis. Electronically Signed   By: [name]  Martinique M.D.   On: 12/17/2015 07:47   Ct Chest Wo Contrast  12/16/2015  CLINICAL DATA:  Recent GI bleeding and  blood transfusion yesterday. Hypoxia. EXAM: CT CHEST WITHOUT CONTRAST TECHNIQUE: Multidetector CT imaging of the chest was performed following the standard protocol without IV contrast. COMPARISON:  None. FINDINGS: THORACIC INLET/BODY WALL: Body wall edema. MEDIASTINUM: Normal heart size. Trace pericardial fluid. Atherosclerosis, including throughout the coronary arteries. No acute vascular finding. No adenopathy. LUNG WINDOWS: Small layering bilateral pleural effusion with multi segment lower lobe opacification. Bronchial crowding is compatible of atelectasis. No septal thickening to suggest interstitial edema. No  consolidation. UPPER ABDOMEN: No acute findings. OSSEOUS: Advanced osteopenia. Diffuse disc degeneration and advanced right glenohumeral osteoarthritis. T12 moderate compression deformity, chronic IMPRESSION: 1. Volume overload with body wall edema and small pleural effusions. No pulmonary edema. 2. Multi segment lower lobe atelectasis. Electronically Signed   By: Monte Fantasia M.D.   On: 12/16/2015 18:21   Dg Chest Portable 1 View  12/16/2015  CLINICAL DATA:  Shortness of Breath EXAM: PORTABLE CHEST 1 VIEW COMPARISON:  October 10, 2015 FINDINGS: There is generalized interstitial edema with patchy alveolar edema in the bases as well. There are small pleural effusions bilaterally. There is cardiomegaly with pulmonary venous hypertension. No adenopathy evident. There is atherosclerotic calcification in the aorta. There is postoperative change in the right shoulder. IMPRESSION: Changes consistent with congestive heart failure. Electronically Signed   By: Lowella Grip III M.D.   On: 12/16/2015 09:08    Oren Binet, MD  [facility] Pager:[phone]  If 7PM-7AM, please contact night-coverage [email or web address] Password [email or web address] 12/19/2015, 1:32 PM   LOS: 3 days

## 2015-12-19 NOTE — H&P (View-Only) (Signed)
Cardiologist:  Marlou Porch Subjective:   Breathing is much improved, off BiPAP, off nasal cannula oxygen. No chest pain. RA  Previously, breathing worsened after fluid administration surrounding iron administration.  Objective:  Vital Signs in the last 24 hours: Temp:  [97.4 F (36.3 C)-98.2 F (36.8 C)] 97.6 F (36.4 C) (03/31 0341) Pulse Rate:  [66-81] 73 (03/31 0341) Resp:  [16-19] 19 (03/31 0341) BP: (125-148)/(47-59) 144/47 mmHg (03/31 0341) SpO2:  [92 %-100 %] 92 % (03/31 0341) Weight:  [124 lb 5.4 oz (56.4 kg)-124 lb 12.5 oz (56.6 kg)] 124 lb 12.5 oz (56.6 kg) (03/31 0341)  Intake/Output from previous day: 03/30 0701 - 03/31 0700 In: 243 [P.O.:240; I.V.:3] Out: 2025 [Urine:2025]   Physical Exam: General: Well developed, well nourished, in no acute distress. Head:  Normocephalic and atraumatic. Lungs: Improved with minimal rales. Heart: Normal S1 and S2.  No murmur, rubs or gallops.  Abdomen: soft, non-tender, positive bowel sounds. Extremities: No clubbing or cyanosis. No edema. Neurologic: Alert and oriented x 3.    Lab Results:  Recent Labs  12/18/15 0810 12/19/15 0420  WBC 14.7* 10.4  HGB 8.3* 8.2*  PLT 317 324    Recent Labs  12/18/15 0429 12/19/15 0420  NA 144 140  K 3.6 4.3  CL 107 106  CO2 28 26  GLUCOSE 97 106*  BUN 23* 25*  CREATININE 1.13* 1.51*    Recent Labs  12/16/15 2240 12/17/15 0500  TROPONINI 0.15* 0.12*   Hepatic Function Panel No results for input(s): PROT, ALBUMIN, AST, ALT, ALKPHOS, BILITOT, BILIDIR, IBILI in the last 72 hours.  Imaging: No results found. Personally viewed.   Telemetry: No adverse arrhythmias Personally viewed.   EKG:  Normal sinus rhythm, poor R-wave progression Personally viewed.  Cardiac Studies:  EF 35-40%  Meds: Scheduled Meds: . sodium chloride   Intravenous Once  . antiseptic oral rinse  7 mL Mouth Rinse BID  . atorvastatin  40 mg Oral q1800  . carvedilol  6.25 mg Oral BID WC  .  enoxaparin (LOVENOX) injection  30 mg Subcutaneous QHS  . insulin aspart  0-5 Units Subcutaneous QHS  . insulin aspart  0-9 Units Subcutaneous TID WC  . isosorbide mononitrate  30 mg Oral Daily  . levothyroxine  125 mcg Oral QAC breakfast  . pantoprazole  20 mg Oral Daily  . potassium chloride  40 mEq Oral BID  . sodium chloride flush  3 mL Intravenous Q12H  . sodium chloride flush  3 mL Intravenous Q12H  . traZODone  50 mg Oral QHS   Continuous Infusions: . sodium chloride 10 mL/hr at 12/19/15 0535  . sodium chloride     PRN Meds:.sodium chloride, sodium chloride, acetaminophen, albuterol, hydrALAZINE, LORazepam, ondansetron (ZOFRAN) IV, sodium chloride flush, sodium chloride flush  Assessment/Plan:  Principal Problem:   Acute combined systolic and diastolic congestive heart failure ([name]) Active Problems:   Hypertension associated with diabetes ([place])   Hypothyroidism   Hyperlipidemia associated with type 2 diabetes mellitus ([name])   Angiodysplasia of stomach and duodenum with hemorrhage   Iron deficiency anemia secondary to blood loss (chronic)   Acute respiratory distress ([name])   AKI (acute kidney injury) ([place])   Acute respiratory failure with hypoxia ([name])   Elevated troponin I level  82 year old female with newly discovered ejection fraction [id number], acute systolic heart failure/cardiomyopathy, resolved acute respiratory failure, iron deficiency anemia secondary to previously described gastric AVMs without any other obvious source.  Acute systolic heart failure -EF 35%  newly discovered with no prior history of MI, vague chest pain previously described. -Given her mildly elevated troponin which is likely demand ischemia in the setting of acute respiratory failure/acute systolic heart failure and to exclude the possibility of severe triple-vessel coronary artery disease, I would like to proceed with diagnostic coronary angiography tomorrow. Her breathing is much improved. Risks and  benefits of procedure including stroke, heart attack, death, renal impairment have been discussed. Off of IV Lasix. -I will add isosorbide 30 mg. I will hold off on adding ACE inhibitor until after cardiac catheterization if renal fnx allows.  Continue with low-dose carvedilol.  Iron deficiency anemia/GI source unknown, gastric AVMs -Iron IV beneficial for her, we will just make sure that she is on furosemide. -Oncology consultation reviewed.  Elevated troponin -0.12, likely demand as above. With cath will exclude severe CAD  Acute respiratory failure resolved -no longer on BiPAP.  She seems stable enough to be transferred to telemetry at this point.  Await cardiac catheterization. Because of her prior bleeding issues, we will have to carefully weigh the risk versus benefit of potential percutaneous intervention. My main goal is to exclude the possibility of triple-vessel coronary artery disease hence the need for bypass surgery.  Gentle rehydration 73ml/hr.       Sevyn Paredez, [place] 12/19/2015, 9:43 AM

## 2015-12-19 NOTE — Interval H&P Note (Signed)
History and Physical Interval Note:  12/19/2015 3:02 PM  Connie [name]  has presented today for surgery, with the diagnosis of Cardiomyopathy / CHF - new Dx.    The various methods of treatment have been discussed with the patient and family. After consideration of risks, benefits and other options for treatment, the patient has consented to  Procedure(s): Left Heart Cath and Coronary Angiography (N/A) as a surgical intervention .  The patient's history has been reviewed, patient examined, no change in status, stable for surgery.  I have reviewed the patient's chart and labs.  Questions were answered to the patient's satisfaction.     HARDING, [name] W

## 2015-12-19 NOTE — Progress Notes (Signed)
Cardiologist:  Marlou Porch Subjective:   Breathing is much improved, off BiPAP, off nasal cannula oxygen. No chest pain. RA  Previously, breathing worsened after fluid administration surrounding iron administration.  Objective:  Vital Signs in the last 24 hours: Temp:  [97.4 F (36.3 C)-98.2 F (36.8 C)] 97.6 F (36.4 C) (03/31 0341) Pulse Rate:  [66-81] 73 (03/31 0341) Resp:  [16-19] 19 (03/31 0341) BP: (125-148)/(47-59) 144/47 mmHg (03/31 0341) SpO2:  [92 %-100 %] 92 % (03/31 0341) Weight:  [124 lb 5.4 oz (56.4 kg)-124 lb 12.5 oz (56.6 kg)] 124 lb 12.5 oz (56.6 kg) (03/31 0341)  Intake/Output from previous day: 03/30 0701 - 03/31 0700 In: 243 [P.O.:240; I.V.:3] Out: 2025 [Urine:2025]   Physical Exam: General: Well developed, well nourished, in no acute distress. Head:  Normocephalic and atraumatic. Lungs: Improved with minimal rales. Heart: Normal S1 and S2.  No murmur, rubs or gallops.  Abdomen: soft, non-tender, positive bowel sounds. Extremities: No clubbing or cyanosis. No edema. Neurologic: Alert and oriented x 3.    Lab Results:  Recent Labs  12/18/15 0810 12/19/15 0420  WBC 14.7* 10.4  HGB 8.3* 8.2*  PLT 317 324    Recent Labs  12/18/15 0429 12/19/15 0420  NA 144 140  K 3.6 4.3  CL 107 106  CO2 28 26  GLUCOSE 97 106*  BUN 23* 25*  CREATININE 1.13* 1.51*    Recent Labs  12/16/15 2240 12/17/15 0500  TROPONINI 0.15* 0.12*   Hepatic Function Panel No results for input(s): PROT, ALBUMIN, AST, ALT, ALKPHOS, BILITOT, BILIDIR, IBILI in the last 72 hours.  Imaging: No results found. Personally viewed.   Telemetry: No adverse arrhythmias Personally viewed.   EKG:  Normal sinus rhythm, poor R-wave progression Personally viewed.  Cardiac Studies:  EF 35-40%  Meds: Scheduled Meds: . sodium chloride   Intravenous Once  . antiseptic oral rinse  7 mL Mouth Rinse BID  . atorvastatin  40 mg Oral q1800  . carvedilol  6.25 mg Oral BID WC  .  enoxaparin (LOVENOX) injection  30 mg Subcutaneous QHS  . insulin aspart  0-5 Units Subcutaneous QHS  . insulin aspart  0-9 Units Subcutaneous TID WC  . isosorbide mononitrate  30 mg Oral Daily  . levothyroxine  125 mcg Oral QAC breakfast  . pantoprazole  20 mg Oral Daily  . potassium chloride  40 mEq Oral BID  . sodium chloride flush  3 mL Intravenous Q12H  . sodium chloride flush  3 mL Intravenous Q12H  . traZODone  50 mg Oral QHS   Continuous Infusions: . sodium chloride 10 mL/hr at 12/19/15 0535  . sodium chloride     PRN Meds:.sodium chloride, sodium chloride, acetaminophen, albuterol, hydrALAZINE, LORazepam, ondansetron (ZOFRAN) IV, sodium chloride flush, sodium chloride flush  Assessment/Plan:  Principal Problem:   Acute combined systolic and diastolic congestive heart failure ([name]) Active Problems:   Hypertension associated with diabetes ([place])   Hypothyroidism   Hyperlipidemia associated with type 2 diabetes mellitus ([name])   Angiodysplasia of stomach and duodenum with hemorrhage   Iron deficiency anemia secondary to blood loss (chronic)   Acute respiratory distress ([name])   AKI (acute kidney injury) ([place])   Acute respiratory failure with hypoxia ([name])   Elevated troponin I level  82 year old female with newly discovered ejection fraction [id number], acute systolic heart failure/cardiomyopathy, resolved acute respiratory failure, iron deficiency anemia secondary to previously described gastric AVMs without any other obvious source.  Acute systolic heart failure -EF 35%  newly discovered with no prior history of MI, vague chest pain previously described. -Given her mildly elevated troponin which is likely demand ischemia in the setting of acute respiratory failure/acute systolic heart failure and to exclude the possibility of severe triple-vessel coronary artery disease, I would like to proceed with diagnostic coronary angiography tomorrow. Her breathing is much improved. Risks and  benefits of procedure including stroke, heart attack, death, renal impairment have been discussed. Off of IV Lasix. -I will add isosorbide 30 mg. I will hold off on adding ACE inhibitor until after cardiac catheterization if renal fnx allows.  Continue with low-dose carvedilol.  Iron deficiency anemia/GI source unknown, gastric AVMs -Iron IV beneficial for her, we will just make sure that she is on furosemide. -Oncology consultation reviewed.  Elevated troponin -0.12, likely demand as above. With cath will exclude severe CAD  Acute respiratory failure resolved -no longer on BiPAP.  She seems stable enough to be transferred to telemetry at this point.  Await cardiac catheterization. Because of her prior bleeding issues, we will have to carefully weigh the risk versus benefit of potential percutaneous intervention. My main goal is to exclude the possibility of triple-vessel coronary artery disease hence the need for bypass surgery.  Gentle rehydration 62ml/hr.       SKAINS, [place] 12/19/2015, 9:43 AM

## 2015-12-19 NOTE — Progress Notes (Signed)
     Added IVF NS 48ml/hr (10 hours) pre cath Creat 1.5 (prior diuresis)  Candee Furbish, MD

## 2015-12-19 NOTE — Progress Notes (Signed)
TR Band removed and gauze/tegaderm placed per orders.  Site is clean/dry/intact.  Foley removed per nursing protocol.

## 2015-12-19 NOTE — Progress Notes (Signed)
    Nonischemic cardiomyopathy No significant CAD  LVEDP 20  Creat 1.5  Let's resume lasix 40mg  PO QD tomorrow.  If she feels well, possible DC tomorrow.   Candee Furbish, MD

## 2015-12-19 NOTE — Progress Notes (Signed)
Social visit  Patient was seen in in the hospital to check up on her hearing she had received IV iron for her iron deficiency anemia and subsequently been admitted with decompensated systolic heart failure. She was likely fluid overloaded and was noted to have progressive anemia again with her hemoglobin having dropped to 7.4 on admission. Echocardiogram showed ejection fraction of 35%. She is doing much better after IV Lasix, optimization of heart failure medications and 1 unit of PRBC transfusion. She is scheduled for a cardiac catheterization on 12/19/2015 to check for ischemic heart disease.  She notes she is feeling much better and after previously requiring BiPAP support is currently off oxygen when seen this evening. She would probably be a difficult candidate for PCI given difficulty with tolerating dual antiplatelet therapy. Continue management as per hospital medicine team and cardiology.  Sullivan Lone MD MS Hematology/Oncology Physician [place]  Unbillable visit

## 2015-12-20 DIAGNOSIS — E785 Hyperlipidemia, unspecified: Secondary | ICD-10-CM

## 2015-12-20 DIAGNOSIS — I5041 Acute combined systolic (congestive) and diastolic (congestive) heart failure: Secondary | ICD-10-CM

## 2015-12-20 DIAGNOSIS — E1169 Type 2 diabetes mellitus with other specified complication: Secondary | ICD-10-CM

## 2015-12-20 DIAGNOSIS — R0600 Dyspnea, unspecified: Secondary | ICD-10-CM

## 2015-12-20 DIAGNOSIS — I10 Essential (primary) hypertension: Secondary | ICD-10-CM

## 2015-12-20 DIAGNOSIS — E1159 Type 2 diabetes mellitus with other circulatory complications: Secondary | ICD-10-CM

## 2015-12-20 LAB — BASIC METABOLIC PANEL
ANION GAP: 7 (ref 5–15)
BUN: 22 mg/dL — ABNORMAL HIGH (ref 6–20)
CHLORIDE: 109 mmol/L (ref 101–111)
CO2: 24 mmol/L (ref 22–32)
Calcium: 8.3 mg/dL — ABNORMAL LOW (ref 8.9–10.3)
Creatinine, Ser: 1.18 mg/dL — ABNORMAL HIGH (ref 0.44–1.00)
GFR calc non Af Amer: 42 mL/min — ABNORMAL LOW (ref >60)
GFR, EST AFRICAN AMERICAN: 49 mL/min — AB (ref >60)
Glucose, Bld: 95 mg/dL (ref 65–99)
POTASSIUM: 4.8 mmol/L (ref 3.5–5.1)
Sodium: 140 mmol/L (ref 135–145)

## 2015-12-20 LAB — CBC
HEMATOCRIT: 29.2 % — AB (ref 36.0–46.0)
HEMOGLOBIN: 8.7 g/dL — AB (ref 12.0–15.0)
MCH: 25.7 pg — ABNORMAL LOW (ref 26.0–34.0)
MCHC: 29.8 g/dL — AB (ref 30.0–36.0)
MCV: 86.4 fL (ref 78.0–100.0)
Platelets: 332 10*3/uL (ref 150–400)
RBC: 3.38 MIL/uL — ABNORMAL LOW (ref 3.87–5.11)
RDW: 17.7 % — ABNORMAL HIGH (ref 11.5–15.5)
WBC: 9.6 10*3/uL (ref 4.0–10.5)

## 2015-12-20 LAB — GLUCOSE, CAPILLARY
GLUCOSE-CAPILLARY: 125 mg/dL — AB (ref 65–99)
Glucose-Capillary: 90 mg/dL (ref 65–99)
Glucose-Capillary: 92 mg/dL (ref 65–99)
Glucose-Capillary: 113 mg/dL — ABNORMAL HIGH (ref 65–99)

## 2015-12-20 LAB — MAGNESIUM: Magnesium: 2 mg/dL (ref 1.7–2.4)

## 2015-12-20 LAB — PROCALCITONIN: Procalcitonin: 0.19 ng/mL

## 2015-12-20 LAB — BRAIN NATRIURETIC PEPTIDE: B NATRIURETIC PEPTIDE 5: 303 pg/mL — AB (ref 0.0–100.0)

## 2015-12-20 MED ORDER — SACUBITRIL-VALSARTAN 24-26 MG PO TABS
1.0000 | ORAL_TABLET | Freq: Two times a day (BID) | ORAL | Status: DC
  Administered 2015-12-20 – 2015-12-21 (×3): 1 via ORAL
  Filled 2015-12-20 (×4): qty 1

## 2015-12-20 MED ORDER — LISINOPRIL 5 MG PO TABS: 5.0000 mg | ORAL_TABLET | Freq: Every day | ORAL | Status: DC

## 2015-12-20 MED ORDER — FUROSEMIDE 10 MG/ML IJ SOLN
40.0000 mg | Freq: Once | INTRAMUSCULAR | Status: AC
  Administered 2015-12-20: 40 mg via INTRAVENOUS
  Filled 2015-12-20: qty 4

## 2015-12-20 NOTE — Progress Notes (Signed)
Subjective: Ready to go home  NO CP  No SOB in bed   Objective: Filed Vitals:   12/19/15 1750 12/19/15 2141 12/20/15 0050 12/20/15 0645  BP: 156/58 112/62 143/44 137/50  Pulse: 66 68 63 65  Temp: 98.4 F (36.9 C) 98.1 F (36.7 C) 97.9 F (36.6 C) 98.2 F (36.8 C)  TempSrc: Oral Oral Oral Tympanic  Resp: 20 19  18   Height:      Weight:    118 lb 11.2 oz (53.842 kg)  SpO2: 93% 94% 92% 94%   Weight change: -5 lb 10.2 oz (-2.558 kg)  Intake/Output Summary (Last 24 hours) at 12/20/15 0908 Last data filed at 12/20/15 0809  Gross per 24 hour  Intake  542.5 ml  Output    850 ml  Net -307.5 ml   I/O  6.6 L  General: Alert, awake, oriented x3, in no acute distress Neck:  JVP is mildly increased  Heart: Regular rate and rhythm, without murmurs, rubs, gallops.  Lungs: Rales at bases   Exemities:  Tr  edema.   Neuro: Grossly intact, nonfocal.  Tele:  SR   Lab Results: Results for orders placed or performed during the hospital encounter of 12/16/15 (from the past 24 hour(s))  Glucose, capillary     Status: Abnormal   Collection Time: 12/19/15 11:50 AM  Result Value Ref Range   Glucose-Capillary 106 (H) 65 - 99 mg/dL   Comment 1 Notify RN    Comment 2 Document in Chart   Glucose, capillary     Status: None   Collection Time: 12/19/15  6:06 PM  Result Value Ref Range   Glucose-Capillary 97 65 - 99 mg/dL  Glucose, capillary     Status: Abnormal   Collection Time: 12/19/15  9:40 PM  Result Value Ref Range   Glucose-Capillary 163 (H) 65 - 99 mg/dL   Comment 1 Notify RN    Comment 2 Document in Chart   Procalcitonin     Status: None   Collection Time: 12/20/15  4:35 AM  Result Value Ref Range   Procalcitonin 0.19 ng/mL  CBC     Status: Abnormal   Collection Time: 12/20/15  4:35 AM  Result Value Ref Range   WBC 9.6 4.0 - 10.5 K/uL   RBC 3.38 (L) 3.87 - 5.11 MIL/uL   Hemoglobin 8.7 (L) 12.0 - 15.0 g/dL   HCT 29.2 (L) 36.0 - 46.0 %   MCV 86.4 78.0 - 100.0 fL   MCH  25.7 (L) 26.0 - 34.0 pg   MCHC 29.8 (L) 30.0 - 36.0 g/dL   RDW 17.7 (H) 11.5 - 15.5 %   Platelets 332 150 - 400 K/uL  Basic metabolic panel     Status: Abnormal   Collection Time: 12/20/15  4:35 AM  Result Value Ref Range   Sodium 140 135 - 145 mmol/L   Potassium 4.8 3.5 - 5.1 mmol/L   Chloride 109 101 - 111 mmol/L   CO2 24 22 - 32 mmol/L   Glucose, Bld 95 65 - 99 mg/dL   BUN 22 (H) 6 - 20 mg/dL   Creatinine, Ser 1.18 (H) 0.44 - 1.00 mg/dL   Calcium 8.3 (L) 8.9 - 10.3 mg/dL   GFR calc non Af Amer 42 (L) >60 mL/min   GFR calc Af Amer 49 (L) >60 mL/min   Anion gap 7 5 - 15  Magnesium     Status: None   Collection Time: 12/20/15  4:35 AM  Result Value Ref Range   Magnesium 2.0 1.7 - 2.4 mg/dL  Glucose, capillary     Status: None   Collection Time: 12/20/15  6:34 AM  Result Value Ref Range   Glucose-Capillary 90 65 - 99 mg/dL   Comment 1 Notify RN    Comment 2 Document in Chart     Studies/Results: No results found.  Medications: Reviewed     @PROBHOSP @  1  Acute systolic CHF LVEF [id number]  LVEDP 26 at cath yesterday   Volume  Is still up a little on exam  WOuld give addional IV lasix x 1 now then will write for daily lasix  Can be started at home Would start Entresto  Continue b blocker   Prob d/c later today  Pt is very eager to go       2  CAD  Mild at cath   ASA and statin     LOS: 4 days   Dorris Carnes 12/20/2015, 9:08 AM

## 2015-12-20 NOTE — Progress Notes (Signed)
[facility] PROGRESS NOTE  Connie [name] [id number] DOB: 12/24/1933 DOA: 12/16/2015 PCP: Wyatt Haste, MD  Assessment/Plan: Principal Problem: Acute hypoxic respiratory failure: Secondary to acute systolic heart failure-much improved following IV Lasix-initially required BiPAP, now on room air., cardiology repeated lasix again this am.  Active Problems: Acute systolic CHF: TTE on [id number] EF around 35%. S/p  LHC 3/31 w/  LVEF 35%, LVEDP 26,  Lasix 40mg  iv x 1 ordered /given this am at 1043 per cards.   CAD, likely nonischemic cardiomyopathy: mild on Cath, asa 81 and statin recd per cards  Htn: on coreg 6.25 bid, Entresto (sacubitril-valsartan) 24-26mg  1tab po bid started  By cards today.  AKI: Likely secondary to diuretics, cr 1.18 today, downtrending from yesterday.  Minimally elevated troponins: Trend is flat, not consistent with ACS. Likely demand ischemia in the setting of above.Cards following-LHC 3/31   Elevated d-dimer: Doubt pulmonary embolism given findings of rales/rhonchi on exam-lower extremity Dopplers negative. Doubt further is required-as significantly improved following IV Lasix. Poor long-term candidate for anticoagulation given chronic GI bleeding.  Elevated lactic acid: remains afebrile, wbc normal/downtrending; Clinical picture is not consistent with sepsis-although upon review has some infiltrates vs atelectasis on CT chest-but no fever or leukocytosis.  Procalcitonin was negative 4/1.  No abx for now.  History of chronic GI bleeding (history of AVMs) and iron deficiency anemia: Transfused 1 unit of PRBC on 3/29 as hemoglobin down to 7.4- no overt bleeding evident;  extensive endoscopic procedures in the past. Hb stable at 8.7 today; .Just received IV iron on 3/27, defer to [facility] as outpt f/u at this time.  Type 2 diabetes: CBGs relatively stable, on oral hypoglycemic agents on hold. Continue SSI. Follow   Hypothyroidism: Continue Synthroid-TSH  elevated at 7.2-not sure when last dosing was adjusted. Suspect further adjustment could be done in the outpatient setting.  Dyslipidemia: Continue statin.  dvt ppx: lovenox 30mg  [place] qd  Code Status: full Family Communication:patient Disposition Plan: pt anxious to go home, but concerns for recurrentcy, home tomorw am   Consultants:  Cardiology, d/w Dr Harrington Challenger today, feel better to keep overnight due to concerns of decompensation.  Procedures:  LHC 12/19/15   Antibiotics: None  HPI/Subjective: Pt feels much better compared to when was admitted. Breathing better, getting up and walking some.  Increase uop.  Understands to watch salt load.  Anxious to get home to see her family /grandkids.  Husband at home dealing w/ water leakage at there home, may be the water heater per pt.   Objective: Filed Vitals:   12/20/15 0050 12/20/15 0645  BP: 143/44 137/50  Pulse: 63 65  Temp: 97.9 F (36.6 C) 98.2 F (36.8 C)  Resp:  18    Intake/Output Summary (Last 24 hours) at 12/20/15 1212 Last data filed at 12/20/15 1039  Gross per 24 hour  Intake    486 ml  Output    550 ml  Net    -64 ml   Filed Weights   12/18/15 2026 12/19/15 0341 12/20/15 0645  Weight: 56.4 kg (124 lb 5.4 oz) 56.6 kg (124 lb 12.5 oz) 53.842 kg (118 lb 11.2 oz)    Exam:   General:  pleasant, NAD, AAOx3  HEENT:  PERRL. mmm  Cardiovascular: RRR, no m/r/g. Trace LE edema bilat.  Respiratory: trace crackles at bases, Normal respiratory effort.  Abdomen: +bs,soft, ntnd  Musculoskeletal:  grossly normal tone BUE/BLE Psychiatric: grossly normal mood and affect, speech fluent and appropriate,  Neurologic: grossly non-focal  Skin: no rash   Data Reviewed: Basic Metabolic Panel:  Recent Labs Lab 12/16/15 1609 12/17/15 0500 12/18/15 0429 12/19/15 0420 12/20/15 0435  NA 142 142 144 140 140  K 3.1* 3.6 3.6 4.3 4.8  CL 108 112* 107 106 109  CO2 22 21* 28 26 24   GLUCOSE 258* 128* 97 106* 95  BUN 24*  24* 23* 25* 22*  CREATININE 1.22* 0.99 1.13* 1.51* 1.18*  CALCIUM 8.8* 8.1* 8.3* 8.4* 8.3*  MG  --   --   --   --  2.0   Liver Function Tests:  Recent Labs Lab 12/16/15 0835  AST 20  ALT 8*  ALKPHOS 66  BILITOT 1.0  PROT 6.2*  ALBUMIN 3.0*   No results for input(s): LIPASE, AMYLASE in the last 168 hours. No results for input(s): AMMONIA in the last 168 hours. CBC:  Recent Labs Lab 12/16/15 0835 12/18/15 0810 12/19/15 0420 12/20/15 0435  WBC 9.0 14.7* 10.4 9.6  NEUTROABS 7.4  --   --   --   HGB 7.4* 8.3* 8.2* 8.7*  HCT 24.3* 27.1* 27.1* 29.2*  MCV 85.9 83.9 85.0 86.4  PLT 326 317 324 332   Cardiac Enzymes:  Recent Labs Lab 12/16/15 1609 12/16/15 2240 12/17/15 0500  TROPONINI 0.17* 0.15* 0.12*   BNP (last 3 results)  Recent Labs  12/16/15 0835 12/20/15 1000  BNP 330.0* 303.0*    ProBNP (last 3 results) No results for input(s): PROBNP in the last 8760 hours.  CBG:  Recent Labs Lab 12/19/15 0735 12/19/15 1150 12/19/15 1806 12/19/15 2140 12/20/15 0634  GLUCAP 102* 106* 97 163* 90    Recent Results (from the past 240 hour(s))  Culture, blood (Routine X 2) w Reflex to ID Panel     Status: None (Preliminary result)   Collection Time: 12/16/15  6:42 PM  Result Value Ref Range Status   Specimen Description BLOOD RIGHT HAND  Final   Special Requests BOTTLES DRAWN AEROBIC ONLY 5CC  Final   Culture NO GROWTH 3 DAYS  Final   Report Status PENDING  Incomplete  Culture, blood (Routine X 2) w Reflex to ID Panel     Status: None (Preliminary result)   Collection Time: 12/16/15  6:49 PM  Result Value Ref Range Status   Specimen Description BLOOD RIGHT ANTECUBITAL  Final   Special Requests BOTTLES DRAWN AEROBIC AND ANAEROBIC 5CC  Final   Culture NO GROWTH 3 DAYS  Final   Report Status PENDING  Incomplete  MRSA PCR Screening     Status: None   Collection Time: 12/16/15 10:06 PM  Result Value Ref Range Status   MRSA by PCR NEGATIVE NEGATIVE Final     Comment:        The GeneXpert MRSA Assay (FDA approved for NASAL specimens only), is one component of a comprehensive MRSA colonization surveillance program. It is not intended to diagnose MRSA infection nor to guide or monitor treatment for MRSA infections.   Urine culture     Status: None   Collection Time: 12/16/15 11:46 PM  Result Value Ref Range Status   Specimen Description URINE, CLEAN CATCH  Final   Special Requests NONE  Final   Culture MULTIPLE SPECIES PRESENT, SUGGEST RECOLLECTION  Final   Report Status 12/18/2015 FINAL  Final     Studies: LHC 12/19/15 1. Ost LM to LM lesion, 40% stenosed. 2. Ost RCA lesion, 20% stenosed. 3. There is moderate left ventricular systolic dysfunction.   Likely nonischemic cardiomyopathy.  There is coronary calcification noted in both the ostial RCA and Left Main, but no significant stenosis noted.   Patient still has somewhat elevated LVEDP with mildly improved EF compared to echocardiogram.  Plan:  Return to nursing unit for continued care.  Would likely require additional diuresis.  Continue afterload reduction as her systemic pressures were elevated   Scheduled Meds: . sodium chloride   Intravenous Once  . antiseptic oral rinse  7 mL Mouth Rinse BID  . atorvastatin  40 mg Oral q1800  . carvedilol  6.25 mg Oral BID WC  . enoxaparin (LOVENOX) injection  30 mg Subcutaneous QHS  . insulin aspart  0-5 Units Subcutaneous QHS  . insulin aspart  0-9 Units Subcutaneous TID WC  . levothyroxine  125 mcg Oral QAC breakfast  . pantoprazole  20 mg Oral Daily  . potassium chloride  40 mEq Oral BID  . sacubitril-valsartan  1 tablet Oral BID  . sodium chloride flush  3 mL Intravenous Q12H  . sodium chloride flush  3 mL Intravenous Q12H  . traZODone  50 mg Oral QHS   Continuous Infusions:   Principal Problem:   Acute combined systolic and diastolic congestive heart failure ([name]) Active Problems:   Hypertension associated with  diabetes ([place])   Hypothyroidism   Hyperlipidemia associated with type 2 diabetes mellitus ([name])   Angiodysplasia of stomach and duodenum with hemorrhage   Iron deficiency anemia secondary to blood loss (chronic)   Acute respiratory distress ([name])   AKI (acute kidney injury) ([place])   Acute respiratory failure with hypoxia ([name])   Elevated troponin I level    Time spent: 61mins    Maren Reamer, MD, MBA/MHA  [facility] Pager [phone]. If 7PM-7AM, please contact night-coverage at [email or web address], password [facility] 12/20/2015, 12:12 PM  LOS: 4 days

## 2015-12-21 LAB — CULTURE, BLOOD (ROUTINE X 2)
Culture: NO GROWTH
Culture: NO GROWTH

## 2015-12-21 LAB — GLUCOSE, CAPILLARY: GLUCOSE-CAPILLARY: 121 mg/dL — AB (ref 65–99)

## 2015-12-21 MED ORDER — SACUBITRIL-VALSARTAN 24-26 MG PO TABS: 1.0000 | ORAL_TABLET | Freq: Two times a day (BID) | ORAL | Status: DC

## 2015-12-21 MED ORDER — CARVEDILOL 6.25 MG PO TABS
6.2500 mg | ORAL_TABLET | Freq: Two times a day (BID) | ORAL | Status: DC
Start: 2015-12-21 — End: 2016-03-19

## 2015-12-21 NOTE — Discharge Summary (Addendum)
Physician Discharge Summary  ARNOLD GOLDY [id number] DOB: 10-08-1933 DOA: 12/16/2015  PCP: Wyatt Haste, MD  Admit date: 12/16/2015 Discharge date: 12/21/2015  Time spent: 35 minutes    Discharge Diagnoses:  Principal Problem:   Acute combined systolic and diastolic congestive heart failure ([name]) Active Problems:   Hypertension associated with diabetes ([place])   Hypothyroidism   Hyperlipidemia associated with type 2 diabetes mellitus ([place])   Angiodysplasia of stomach and duodenum with hemorrhage   Iron deficiency anemia secondary to blood loss (chronic)   Acute respiratory distress ([name])   AKI (acute kidney injury) ([place])   Acute respiratory failure with hypoxia ([name])   Elevated troponin I level    Recommendations for Outpatient Follow-up:  1. Pcp, Dr Redmond School, Jenny Reichmann 2-4days 2. Cardiology clinic Dr Marlou Porch, M 1 wk, hospital f/u  Discharge Condition: stable  Diet recommendation: cardiac /heart healthy  Filed Weights   12/19/15 0341 12/20/15 0645 12/21/15 0504  Weight: 56.6 kg (124 lb 12.5 oz) 53.842 kg (118 lb 11.2 oz) 52.1 kg (114 lb 13.8 oz)    History of present illness:  This is an 82 year old female with known chronic lower extremity edema, hypothyroidism as well as diabetes and followed by Dr. Benson Norway for angiodysplasia of the stomach and duodenum. She was recently discharged on the 11/22/15 after admission for symptomatic anemia felt to be related to GI source.  She was doing well since discharge and presented for iron infusion on 3/27. Subsequently, the night prior to admission she felt uncomfortable and unable to sleep and lay flat.  She also notes significant shortness of breath after walking to the bathroom and significant orthopnea. EMS was called and patient was brought to the ER for further evaluation  Hospital Course:  Patient was seen by the hospitalist service and admitted. She was initially on BiPAP and weaned off. Cardiology was consulted. An  echocardiogram was done.  Patient was found to have newly discovered cardiomyopathy with an ejection fraction of [id number], acute systolic heart failure.  She was started on Lasix with good results of diuresis.  Given her history of gastric AVMs. She is unable to tolerate aspirin or Plavix or any other blood impairment thinners at this time.  IV nitroglycerin was stopped and she was continued on low-dose Coreg and imdur.  She underwent a left heart catheterization on 12/19/2015.  Found to have ostium left main LM lesion, 40% stenosed, Ost RCA lesion 20% stenosed, moderate LV systolic dysfunction.  Likely nonischemic cardiomyopathy. Patient still had somewhat elevated LVEDP with mildly improved ejection fraction compared to prior echocardiogram.  Patient's medications were adjusted. ACE inhibitor was started and weaned off the indoor.  Diuresis with Lasix was continued with good results.  Patient was discharged home in stable condition today after cardiology evaluation and clearance.    Procedures: 12/19/15 Left Heart Cath and Coronary Angiography    Conclusion     Ost LM to LM lesion, 40% stenosed.  Ost RCA lesion, 20% stenosed.  There is moderate left ventricular systolic dysfunction.   Likely nonischemic cardiomyopathy. There is coronary calcification noted in both the ostial RCA and Left Main, but no significant stenosis noted.   Patient still has somewhat elevated LVEDP with mildly improved EF compared to echocardiogram.  Plan:  Return to nursing unit for continued care.  Would likely require additional diuresis.  Continue afterload reduction as her systemic pressures were elevated    12/17/15 Echo - Left ventricle: The cavity size was moderately dilated. Wall  thickness was normal.  Systolic function was moderately reduced.  The estimated ejection fraction was in the range of 35% to 40%.  Diffuse hypokinesis. Doppler parameters are consistent with both  elevated ventricular  end-diastolic filling pressure and elevated  left atrial filling pressure. - Aortic valve: There was trivial regurgitation. - Mitral valve: There was mild regurgitation. - Atrial septum: No defect or patent foramen ovale was identified. - Pulmonary arteries: PA peak pressure: 37 mm Hg (S). - Pericardium, extracardiac: Small posterior pericardial effusion  Consultations:  cardiology  Discharge Exam: Filed Vitals:   12/21/15 0504 12/21/15 0933  BP: 166/51 110/72  Pulse: 64 61  Temp: 97.8 F (36.6 C) 98 F (36.7 C)  Resp:  18     General: pleasant, NAD, AAOx3  HEENT: ERRL. mmm  Cardiovascular: RRR, no m/r/g. No LE edema  Respiratory: CTA bilaterally, intermittant fine crackles, less than yesterday day. Normal respiratory effort.  Abdomen: +bs, soft, ntnd  Musculoskeletal: grossly normal tone BUE/BLE  Psychiatric: grossly normal mood and affect, speech fluent and appropriate  Neurologic: grossly non-focal  Skin: no rash, no pallor   Discharge Instructions   Discharge Instructions    Avoid straining    Complete by:  As directed      Diet - low sodium heart healthy    Complete by:  As directed      Heart Failure patients record your daily weight using the same scale at the same time of day    Complete by:  As directed      Increase activity slowly    Complete by:  As directed      STOP any activity that causes chest pain, shortness of breath, dizziness, sweating, or exessive weakness    Complete by:  As directed           Current Discharge Medication List    START taking these medications   Details  carvedilol (COREG) 6.25 MG tablet Take 1 tablet (6.25 mg total) by mouth 2 (two) times daily with a meal. Qty: 60 tablet, Refills: 0    sacubitril-valsartan (ENTRESTO) 24-26 MG Take 1 tablet by mouth 2 (two) times daily. Qty: 60 tablet, Refills: 0      CONTINUE these medications which have NOT CHANGED   Details  atorvastatin (LIPITOR) 40 MG tablet  TAKE ONE TABLET BY MOUTH ONCE DAILY Qty: 90 tablet, Refills: 3   Associated Diagnoses: Hyperlipidemia associated with type 2 diabetes mellitus ([name])    furosemide (LASIX) 20 MG tablet Take 1 tablet (20 mg total) by mouth daily. Qty: 90 tablet, Refills: 0   Associated Diagnoses: Peripheral edema    levothyroxine (SYNTHROID, LEVOTHROID) 125 MCG tablet TAKE ONE TABLET BY MOUTH EVERY DAY Qty: 90 tablet, Refills: 3    pantoprazole (PROTONIX) 20 MG tablet Take 1 tablet (20 mg total) by mouth daily. Qty: 30 tablet, Refills: 3    pioglitazone (ACTOS) 30 MG tablet Take 1 tablet (30 mg total) by mouth daily. Qty: 90 tablet, Refills: 1    potassium chloride (K-DUR,KLOR-CON) 10 MEQ tablet Take 1 tablet (10 mEq total) by mouth 2 (two) times daily. Qty: 180 tablet, Refills: 1   Associated Diagnoses: Peripheral edema    traZODone (DESYREL) 50 MG tablet Half tablet at night as needed for sleep Qty: 20 tablet, Refills: 0   Associated Diagnoses: Dependent edema       No Known Allergies Follow-up Information    Follow up with Wyatt Haste, MD. Schedule an appointment as soon as possible for a visit in  2 days.   Specialty:  Family Medicine   Contact information:   [address] [place] [address] [phone]       Follow up with Candee Furbish, MD. Schedule an appointment as soon as possible for a visit in 2 days.   Specialty:  Cardiology   Contact information:   [id number] [place] [address] [phone]        [name] (including imaging, microbiology, ancillary and laboratory) are listed below for reference.    Significant Diagnostic Studies: Dg Chest 2 View  12/17/2015  CLINICAL DATA:  Acute CHF, respiratory distress, acute renal injury, diabetes EXAM: CHEST  2 VIEW COMPARISON:  Chest x-ray and chest CT scan of December 16, 2015 FINDINGS: The lungs are adequately inflated. The  interstitial markings remain increased bilaterally. The pulmonary vascularity appears less engorged but remains abnormal. The cardiac silhouette remains enlarged. There is left lower lobe atelectasis. There small bilateral pleural effusions. The bony thorax exhibits no acute abnormalities. There degenerative changes of both shoulders. IMPRESSION: Improving CHF. Persistent bilateral interstitial edema, small pleural effusions, and left lower lobe atelectasis. Electronically Signed   By: [name]  Martinique M.D.   On: 12/17/2015 07:47   Ct Chest Wo Contrast  12/16/2015  CLINICAL DATA:  Recent GI bleeding and blood transfusion yesterday. Hypoxia. EXAM: CT CHEST WITHOUT CONTRAST TECHNIQUE: Multidetector CT imaging of the chest was performed following the standard protocol without IV contrast. COMPARISON:  None. FINDINGS: THORACIC INLET/BODY WALL: Body wall edema. MEDIASTINUM: Normal heart size. Trace pericardial fluid. Atherosclerosis, including throughout the coronary arteries. No acute vascular finding. No adenopathy. LUNG WINDOWS: Small layering bilateral pleural effusion with multi segment lower lobe opacification. Bronchial crowding is compatible of atelectasis. No septal thickening to suggest interstitial edema. No consolidation. UPPER ABDOMEN: No acute findings. OSSEOUS: Advanced osteopenia. Diffuse disc degeneration and advanced right glenohumeral osteoarthritis. T12 moderate compression deformity, chronic IMPRESSION: 1. Volume overload with body wall edema and small pleural effusions. No pulmonary edema. 2. Multi segment lower lobe atelectasis. Electronically Signed   By: Monte Fantasia M.D.   On: 12/16/2015 18:21   Dg Chest Portable 1 View  12/16/2015  CLINICAL DATA:  Shortness of Breath EXAM: PORTABLE CHEST 1 VIEW COMPARISON:  October 10, 2015 FINDINGS: There is generalized interstitial edema with patchy alveolar edema in the bases as well. There are small pleural effusions bilaterally. There is cardiomegaly  with pulmonary venous hypertension. No adenopathy evident. There is atherosclerotic calcification in the aorta. There is postoperative change in the right shoulder. IMPRESSION: Changes consistent with congestive heart failure. Electronically Signed   By: Lowella Grip III M.D.   On: 12/16/2015 09:08    Microbiology: Recent Results (from the past 240 hour(s))  Culture, blood (Routine X 2) w Reflex to ID Panel     Status: None (Preliminary result)   Collection Time: 12/16/15  6:42 PM  Result Value Ref Range Status   Specimen Description BLOOD RIGHT HAND  Final   Special Requests BOTTLES DRAWN AEROBIC ONLY 5CC  Final   Culture NO GROWTH 4 DAYS  Final   Report Status PENDING  Incomplete  Culture, blood (Routine X 2) w Reflex to ID Panel     Status: None (Preliminary result)   Collection Time: 12/16/15  6:49 PM  Result Value Ref Range Status   Specimen Description BLOOD RIGHT ANTECUBITAL  Final   Special Requests BOTTLES DRAWN AEROBIC AND ANAEROBIC 5CC  Final   Culture  NO GROWTH 4 DAYS  Final   Report Status PENDING  Incomplete  MRSA PCR Screening     Status: None   Collection Time: 12/16/15 10:06 PM  Result Value Ref Range Status   MRSA by PCR NEGATIVE NEGATIVE Final    Comment:        The GeneXpert MRSA Assay (FDA approved for NASAL specimens only), is one component of a comprehensive MRSA colonization surveillance program. It is not intended to diagnose MRSA infection nor to guide or monitor treatment for MRSA infections.   Urine culture     Status: None   Collection Time: 12/16/15 11:46 PM  Result Value Ref Range Status   Specimen Description URINE, CLEAN CATCH  Final   Special Requests NONE  Final   Culture MULTIPLE SPECIES PRESENT, SUGGEST RECOLLECTION  Final   Report Status 12/18/2015 FINAL  Final     Labs: Basic Metabolic Panel:  Recent Labs Lab 12/16/15 1609 12/17/15 0500 12/18/15 0429 12/19/15 0420 12/20/15 0435  NA 142 142 144 140 140  K 3.1* 3.6 3.6  4.3 4.8  CL 108 112* 107 106 109  CO2 22 21* 28 26 24   GLUCOSE 258* 128* 97 106* 95  BUN 24* 24* 23* 25* 22*  CREATININE 1.22* 0.99 1.13* 1.51* 1.18*  CALCIUM 8.8* 8.1* 8.3* 8.4* 8.3*  MG  --   --   --   --  2.0   Liver Function Tests:  Recent Labs Lab 12/16/15 0835  AST 20  ALT 8*  ALKPHOS 66  BILITOT 1.0  PROT 6.2*  ALBUMIN 3.0*   No results for input(s): LIPASE, AMYLASE in the last 168 hours. No results for input(s): AMMONIA in the last 168 hours. CBC:  Recent Labs Lab 12/16/15 0835 12/18/15 0810 12/19/15 0420 12/20/15 0435  WBC 9.0 14.7* 10.4 9.6  NEUTROABS 7.4  --   --   --   HGB 7.4* 8.3* 8.2* 8.7*  HCT 24.3* 27.1* 27.1* 29.2*  MCV 85.9 83.9 85.0 86.4  PLT 326 317 324 332   Cardiac Enzymes:  Recent Labs Lab 12/16/15 1609 12/16/15 2240 12/17/15 0500  TROPONINI 0.17* 0.15* 0.12*   BNP: BNP (last 3 results)  Recent Labs  12/16/15 0835 12/20/15 1000  BNP 330.0* 303.0*    ProBNP (last 3 results) No results for input(s): PROBNP in the last 8760 hours.  CBG:  Recent Labs Lab 12/20/15 0634 12/20/15 1233 12/20/15 1603 12/20/15 2221 12/21/15 0757  GLUCAP 90 92 125* 113* 121*       Signed:  Maren Reamer MD., MBA/MHA [facility] 12/21/2015, 11:43 AM Pager [phone]

## 2015-12-21 NOTE — Progress Notes (Addendum)
[facility] PROGRESS NOTE  Connie [name] [id number] DOB: 03-04-1934 DOA: 12/16/2015 PCP: Wyatt Haste, MD  Assessment/Plan: Principal Problem: Acute hypoxic respiratory failure: Secondary to acute systolic heart failure-much improved following IV Lasix, initially required BiPAP, now on room air., cardiology repeated lasix 4/1, with good diuresis (-1.5l)  Active Problems: Acute systolic CHF: TTE on [id number] EF around 35%. S/p LHC 3/31 w/ LVEF 35%, LVEDP 26, Lasix 40mg  iv x 1 on 4/1 w/ - 1.5L  CAD, likely nonischemic cardiomyopathy: mild on Cath, asa 81 and statin recd per cards  Htn: elavated today, continue coreg 6.25 bid, Entresto (sacubitril-valsartan) 24-26mg  1tab po bid started 4/1 By cards.  AKI: Likely secondary to diuretics, cr 1.18 today, downtrending  Minimally elevated troponins: Trend is flat, not consistent with ACS. Likely demand ischemia in the setting of above.Cards following-LHC 3/31   Elevated d-dimer: Doubt pulmonary embolism given findings of rales/rhonchi on exam-lower extremity Dopplers negative. Doubt further is required-as significantly improved following IV Lasix. Poor long-term candidate for anticoagulation given chronic GI bleeding.  Elevated lactic acid: remains afebrile, wbc normal/downtrending; Clinical picture is not consistent with sepsis-although upon review has some infiltrates vs atelectasis on CT chest-but no fever or leukocytosis. Procalcitonin was negative 4/1. No abx for now.  History of chronic GI bleeding (history of AVMs) and iron deficiency anemia: Transfused 1 unit of PRBC on 3/29 as hemoglobin down to 7.4- no overt bleeding evident; extensive endoscopic procedures in the past. Hb stable at 8.7 today; .Just received IV iron on 3/27, defer to [facility] as outpt f/u at this time.  Type 2 diabetes: CBGs relatively stable, on oral hypoglycemic agents on hold. Continue SSI. Follow   Hypothyroidism: Continue Synthroid-TSH  elevated at 7.2-not sure when last dosing was adjusted. Suspect further adjustment could be done in the outpatient setting.  Dyslipidemia: Continue statin.  Will dw cards pa Estell Harpin on call, holding orders for DC placed. DC if cleared by cards.  Code Status: full Family Communication: patient Disposition Plan: home if ok by cards DVT ppx: lovenox 30mg  [place] qd  Consultants: cardiology  Procedures:  LHC 12/19/15  Antibiotics: Anti-infectives    None      HPI/Subjective: Feels much better today, no cp/doe/sob.  Urinating quite a bit.  Family were able to come visit her yesterday afternoon before they flew out this am.  Wants to go home.  Objective: Filed Vitals:   12/21/15 0024 12/21/15 0504  BP: 144/58 166/51  Pulse: 68 64  Temp: 98 F (36.7 C) 97.8 F (36.6 C)  Resp: 16     Intake/Output Summary (Last 24 hours) at 12/21/15 0814 Last data filed at 12/21/15 0500  Gross per 24 hour  Intake    566 ml  Output   2325 ml  Net  -1759 ml   Filed Weights   12/19/15 0341 12/20/15 0645 12/21/15 0504  Weight: 56.6 kg (124 lb 12.5 oz) 53.842 kg (118 lb 11.2 oz) 52.1 kg (114 lb 13.8 oz)    Exam:   General:    pleasant, NAD, AAOx3  HEENT:  ERRL. mmm  Cardiovascular: RRR, no m/r/g. No LE edema  Respiratory: CTA bilaterally, intermittant fine crackles, less than yesterday day. Normal respiratory effort.  Abdomen: +bs,  soft, ntnd  Musculoskeletal:  grossly normal tone BUE/BLE Psychiatric: grossly normal mood and affect, speech fluent and appropriate Neurologic: grossly non-focal Skin: no rash, no pallor   Data Reviewed: Basic Metabolic Panel:  Recent Labs Lab 12/16/15 1609 12/17/15 0500 12/18/15 0429 12/19/15 0420  12/20/15 0435  NA 142 142 144 140 140  K 3.1* 3.6 3.6 4.3 4.8  CL 108 112* 107 106 109  CO2 22 21* 28 26 24   GLUCOSE 258* 128* 97 106* 95  BUN 24* 24* 23* 25* 22*  CREATININE 1.22* 0.99 1.13* 1.51* 1.18*  CALCIUM 8.8* 8.1* 8.3* 8.4* 8.3*  MG   --   --   --   --  2.0   Liver Function Tests:  Recent Labs Lab 12/16/15 0835  AST 20  ALT 8*  ALKPHOS 66  BILITOT 1.0  PROT 6.2*  ALBUMIN 3.0*   No results for input(s): LIPASE, AMYLASE in the last 168 hours. No results for input(s): AMMONIA in the last 168 hours. CBC:  Recent Labs Lab 12/16/15 0835 12/18/15 0810 12/19/15 0420 12/20/15 0435  WBC 9.0 14.7* 10.4 9.6  NEUTROABS 7.4  --   --   --   HGB 7.4* 8.3* 8.2* 8.7*  HCT 24.3* 27.1* 27.1* 29.2*  MCV 85.9 83.9 85.0 86.4  PLT 326 317 324 332   Cardiac Enzymes:  Recent Labs Lab 12/16/15 1609 12/16/15 2240 12/17/15 0500  TROPONINI 0.17* 0.15* 0.12*   BNP (last 3 results)  Recent Labs  12/16/15 0835 12/20/15 1000  BNP 330.0* 303.0*    ProBNP (last 3 results) No results for input(s): PROBNP in the last 8760 hours.  CBG:  Recent Labs Lab 12/20/15 0634 12/20/15 1233 12/20/15 1603 12/20/15 2221 12/21/15 0757  GLUCAP 90 92 125* 113* 121*    Recent Results (from the past 240 hour(s))  Culture, blood (Routine X 2) w Reflex to ID Panel     Status: None (Preliminary result)   Collection Time: 12/16/15  6:42 PM  Result Value Ref Range Status   Specimen Description BLOOD RIGHT HAND  Final   Special Requests BOTTLES DRAWN AEROBIC ONLY 5CC  Final   Culture NO GROWTH 4 DAYS  Final   Report Status PENDING  Incomplete  Culture, blood (Routine X 2) w Reflex to ID Panel     Status: None (Preliminary result)   Collection Time: 12/16/15  6:49 PM  Result Value Ref Range Status   Specimen Description BLOOD RIGHT ANTECUBITAL  Final   Special Requests BOTTLES DRAWN AEROBIC AND ANAEROBIC 5CC  Final   Culture NO GROWTH 4 DAYS  Final   Report Status PENDING  Incomplete  MRSA PCR Screening     Status: None   Collection Time: 12/16/15 10:06 PM  Result Value Ref Range Status   MRSA by PCR NEGATIVE NEGATIVE Final    Comment:        The GeneXpert MRSA Assay (FDA approved for NASAL specimens only), is one component  of a comprehensive MRSA colonization surveillance program. It is not intended to diagnose MRSA infection nor to guide or monitor treatment for MRSA infections.   Urine culture     Status: None   Collection Time: 12/16/15 11:46 PM  Result Value Ref Range Status   Specimen Description URINE, CLEAN CATCH  Final   Special Requests NONE  Final   Culture MULTIPLE SPECIES PRESENT, SUGGEST RECOLLECTION  Final   Report Status 12/18/2015 FINAL  Final     Studies: No results found.  Scheduled Meds: . sodium chloride   Intravenous Once  . antiseptic oral rinse  7 mL Mouth Rinse BID  . atorvastatin  40 mg Oral q1800  . carvedilol  6.25 mg Oral BID WC  . enoxaparin (LOVENOX) injection  30 mg Subcutaneous QHS  .  insulin aspart  0-5 Units Subcutaneous QHS  . insulin aspart  0-9 Units Subcutaneous TID WC  . levothyroxine  125 mcg Oral QAC breakfast  . pantoprazole  20 mg Oral Daily  . potassium chloride  40 mEq Oral BID  . sacubitril-valsartan  1 tablet Oral BID  . sodium chloride flush  3 mL Intravenous Q12H  . sodium chloride flush  3 mL Intravenous Q12H  . traZODone  50 mg Oral QHS   Continuous Infusions:   Principal Problem:   Acute combined systolic and diastolic congestive heart failure ([name]) Active Problems:   Hypertension associated with diabetes ([place])   Hypothyroidism   Hyperlipidemia associated with type 2 diabetes mellitus ([name])   Angiodysplasia of stomach and duodenum with hemorrhage   Iron deficiency anemia secondary to blood loss (chronic)   Acute respiratory distress ([name])   AKI (acute kidney injury) ([place])   Acute respiratory failure with hypoxia ([name])   Elevated troponin I level    Time spent: 33mins    Maren Reamer, MD, MBA/MHA  [facility] Pager [phone]. If 7PM-7AM, please contact night-coverage at [email or web address], password [facility] 12/21/2015, 8:14 AM  LOS: 5 days

## 2015-12-21 NOTE — Discharge Instructions (Signed)
Heart Failure Heart failure is a condition in which the heart has trouble pumping blood. This means your heart does not pump blood efficiently for your body to work well. In some cases of heart failure, fluid may back up into your lungs or you may have swelling (edema) in your lower legs. Heart failure is usually a long-term (chronic) condition. It is important for you to take good care of yourself and follow your health care provider's treatment plan. CAUSES  Some health conditions can cause heart failure. Those health conditions include:  High blood pressure (hypertension). Hypertension causes the heart muscle to work harder than normal. When pressure in the blood vessels is high, the heart needs to pump (contract) with more force in order to circulate blood throughout the body. High blood pressure eventually causes the heart to become stiff and weak.  Coronary artery disease (CAD). CAD is the buildup of cholesterol and fat (plaque) in the arteries of the heart. The blockage in the arteries deprives the heart muscle of oxygen and blood. This can cause chest pain and may lead to a heart attack. High blood pressure can also contribute to CAD.  Heart attack (myocardial infarction). A heart attack occurs when one or more arteries in the heart become blocked. The loss of oxygen damages the muscle tissue of the heart. When this happens, part of the heart muscle dies. The injured tissue does not contract as well and weakens the heart's ability to pump blood.  Abnormal heart valves. When the heart valves do not open and close properly, it can cause heart failure. This makes the heart muscle pump harder to keep the blood flowing.  Heart muscle disease (cardiomyopathy or myocarditis). Heart muscle disease is damage to the heart muscle from a variety of causes. These can include drug or alcohol abuse, infections, or unknown reasons. These can increase the risk of heart failure.  Lung disease. Lung disease  makes the heart work harder because the lungs do not work properly. This can cause a strain on the heart, leading it to fail.  Diabetes. Diabetes increases the risk of heart failure. High blood sugar contributes to high fat (lipid) levels in the blood. Diabetes can also cause slow damage to tiny blood vessels that carry important nutrients to the heart muscle. When the heart does not get enough oxygen and food, it can cause the heart to become weak and stiff. This leads to a heart that does not contract efficiently.  Other conditions can contribute to heart failure. These include abnormal heart rhythms, thyroid problems, and low blood counts (anemia). Certain unhealthy behaviors can increase the risk of heart failure, including:  Being overweight.  Smoking or chewing tobacco.  Eating foods high in fat and cholesterol.  Abusing illicit drugs or alcohol.  Lacking physical activity. SYMPTOMS  Heart failure symptoms may vary and can be hard to detect. Symptoms may include:  Shortness of breath with activity, such as climbing stairs.  Persistent cough.  Swelling of the feet, ankles, legs, or abdomen.  Unexplained weight gain.  Difficulty breathing when lying flat (orthopnea).  Waking from sleep because of the need to sit up and get more air.  Rapid heartbeat.  Fatigue and loss of energy.  Feeling light-headed, dizzy, or close to fainting.  Loss of appetite.  Nausea.  Increased urination during the night (nocturia). DIAGNOSIS  A diagnosis of heart failure is based on your history, symptoms, physical examination, and diagnostic tests. Diagnostic tests for heart failure may include:  Echocardiography. °· Electrocardiography. °· Chest X-ray. °· Blood tests. °· Exercise stress test. °· Cardiac angiography. °· Radionuclide scans. °TREATMENT  °Treatment is aimed at managing the symptoms of heart failure. Medicines, behavioral changes, or surgical intervention may be necessary to  treat heart failure. °· Medicines to help treat heart failure may include: °¨ Angiotensin-converting enzyme (ACE) inhibitors. This type of medicine blocks the effects of a blood protein called angiotensin-converting enzyme. ACE inhibitors relax (dilate) the blood vessels and help lower blood pressure. °¨ Angiotensin receptor blockers (ARBs). This type of medicine blocks the actions of a blood protein called angiotensin. Angiotensin receptor blockers dilate the blood vessels and help lower blood pressure. °¨ Water pills (diuretics). Diuretics cause the kidneys to remove salt and water from the blood. The extra fluid is removed through urination. This loss of extra fluid lowers the volume of blood the heart pumps. °¨ Beta blockers. These prevent the heart from beating too fast and improve heart muscle strength. °¨ Digitalis. This increases the force of the heartbeat. °· Healthy behavior changes include: °¨ Obtaining and maintaining a healthy weight. °¨ Stopping smoking or chewing tobacco. °¨ Eating heart-healthy foods. °¨ Limiting or avoiding alcohol. °¨ Stopping illicit drug use. °¨ Physical activity as directed by your health care provider. °· Surgical treatment for heart failure may include: °¨ A procedure to open blocked arteries, repair damaged heart valves, or remove damaged heart muscle tissue. °¨ A pacemaker to improve heart muscle function and control certain abnormal heart rhythms. °¨ An internal cardioverter defibrillator to treat certain serious abnormal heart rhythms. °¨ A left ventricular assist device (LVAD) to assist the pumping ability of the heart. °HOME CARE INSTRUCTIONS  °· Take medicines only as directed by your health care provider. Medicines are important in reducing the workload of your heart, slowing the progression of heart failure, and improving your symptoms. °¨ Do not stop taking your medicine unless directed by your health care provider. °¨ Do not skip any dose of medicine. °¨ Refill your  prescriptions before you run out of medicine. Your medicines are needed every day. °· Engage in moderate physical activity if directed by your health care provider. Moderate physical activity can benefit some people. The elderly and people with severe heart failure should consult with a health care provider for physical activity recommendations. °· Eat heart-healthy foods. Food choices should be free of trans fat and low in saturated fat, cholesterol, and salt (sodium). Healthy choices include fresh or frozen fruits and vegetables, fish, lean meats, legumes, fat-free or low-fat dairy products, and whole grain or high fiber foods. Talk to a dietitian to learn more about heart-healthy foods. °· Limit sodium if directed by your health care provider. Sodium restriction may reduce symptoms of heart failure in some people. Talk to a dietitian to learn more about heart-healthy seasonings. °· Use healthy cooking methods. Healthy cooking methods include roasting, grilling, broiling, baking, poaching, steaming, or stir-frying. Talk to a dietitian to learn more about healthy cooking methods. °· Limit fluids if directed by your health care provider. Fluid restriction may reduce symptoms of heart failure in some people. °· Weigh yourself every day. Daily weights are important in the early recognition of excess fluid. You should weigh yourself every morning after you urinate and before you eat breakfast. Wear the same amount of clothing each time you weigh yourself. Record your daily weight. Provide your health care provider with your weight record. °· Monitor and record your blood pressure if directed by your health care   provider.  Check your pulse if directed by your health care provider.  Lose weight if directed by your health care provider. Weight loss may reduce symptoms of heart failure in some people.  Stop smoking or chewing tobacco. Nicotine makes your heart work harder by causing your blood vessels to constrict.  Do not use nicotine gum or patches before talking to your health care provider.  Keep all follow-up visits as directed by your health care provider. This is important.  Limit alcohol intake to no more than 1 drink per day for nonpregnant women and 2 drinks per day for men. One drink equals 12 ounces of beer, 5 ounces of wine, or 1 ounces of hard liquor. Drinking more than that is harmful to your heart. Tell your health care provider if you drink alcohol several times a week. Talk with your health care provider about whether alcohol is safe for you. If your heart has already been damaged by alcohol or you have severe heart failure, drinking alcohol should be stopped completely.  Stop illicit drug use.  Stay up-to-date with immunizations. It is especially important to prevent respiratory infections through current pneumococcal and influenza immunizations.  Manage other health conditions such as hypertension, diabetes, thyroid disease, or abnormal heart rhythms as directed by your health care provider.  Learn to manage stress.  Plan rest periods when fatigued.  Learn strategies to manage high temperatures. If the weather is extremely hot:  Avoid vigorous physical activity.  Use air conditioning or fans or seek a cooler location.  Avoid caffeine and alcohol.  Wear loose-fitting, lightweight, and light-colored clothing.  Learn strategies to manage cold temperatures. If the weather is extremely cold:  Avoid vigorous physical activity.  Layer clothes.  Wear mittens or gloves, a hat, and a scarf when going outside.  Avoid alcohol.  Obtain ongoing education and support as needed.  Participate in or seek rehabilitation as needed to maintain or improve independence and quality of life. SEEK MEDICAL CARE IF:   You have a rapid weight gain.  You have increasing shortness of breath that is unusual for you.  You are unable to participate in your usual physical activities.  You tire  easily.  You cough more than normal, especially with physical activity.  You have any or more swelling in areas such as your hands, feet, ankles, or abdomen.  You are unable to sleep because it is hard to breathe.  You feel like your heart is beating fast (palpitations).  You become dizzy or light-headed upon standing up. SEEK IMMEDIATE MEDICAL CARE IF:   You have difficulty breathing.  There is a change in mental status such as decreased alertness or difficulty with concentration.  You have a pain or discomfort in your chest.  You have an episode of fainting (syncope). MAKE SURE YOU:   Understand these instructions.  Will watch your condition.  Will get help right away if you are not doing well or get worse.   This information is not intended to replace advice given to you by your health care provider. Make sure you discuss any questions you have with your health care provider.   Document Released: 09/06/2005 Document Revised: 01/21/2015 Document Reviewed: 10/06/2012 Elsevier Interactive Patient Education 2016 [employer].  Diabetes Mellitus and Food It is important for you to manage your blood sugar (glucose) level. Your blood glucose level can be greatly affected by what you eat. Eating healthier foods in the appropriate amounts throughout the day at about the same  time each day will help you control your blood glucose level. It can also help slow or prevent worsening of your diabetes mellitus. Healthy eating may even help you improve the level of your blood pressure and reach or maintain a healthy weight.  General recommendations for healthful eating and cooking habits include:  Eating meals and snacks regularly. Avoid going long periods of time without eating to lose weight.  Eating a diet that consists mainly of plant-based foods, such as fruits, vegetables, nuts, legumes, and whole grains.  Using low-heat cooking methods, such as baking, instead of high-heat cooking  methods, such as deep frying. Work with your dietitian to make sure you understand how to use the Nutrition Facts information on food labels. HOW CAN FOOD AFFECT ME? Carbohydrates Carbohydrates affect your blood glucose level more than any other type of food. Your dietitian will help you determine how many carbohydrates to eat at each meal and teach you how to count carbohydrates. Counting carbohydrates is important to keep your blood glucose at a healthy level, especially if you are using insulin or taking certain medicines for diabetes mellitus. Alcohol Alcohol can cause sudden decreases in blood glucose (hypoglycemia), especially if you use insulin or take certain medicines for diabetes mellitus. Hypoglycemia can be a life-threatening condition. Symptoms of hypoglycemia (sleepiness, dizziness, and disorientation) are similar to symptoms of having too much alcohol.  If your health care provider has given you approval to drink alcohol, do so in moderation and use the following guidelines:  Women should not have more than one drink per day, and men should not have more than two drinks per day. One drink is equal to:  12 oz of beer.  5 oz of wine.  1 oz of hard liquor.  Do not drink on an empty stomach.  Keep yourself hydrated. Have water, diet soda, or unsweetened iced tea.  Regular soda, juice, and other mixers might contain a lot of carbohydrates and should be counted. WHAT FOODS ARE NOT RECOMMENDED? As you make food choices, it is important to remember that all foods are not the same. Some foods have fewer nutrients per serving than other foods, even though they might have the same number of calories or carbohydrates. It is difficult to get your body what it needs when you eat foods with fewer nutrients. Examples of foods that you should avoid that are high in calories and carbohydrates but low in nutrients include:  Trans fats (most processed foods list trans fats on the Nutrition Facts  label).  Regular soda.  Juice.  Candy.  Sweets, such as cake, pie, doughnuts, and cookies.  Fried foods. WHAT FOODS CAN I EAT? Eat nutrient-rich foods, which will nourish your body and keep you healthy. The food you should eat also will depend on several factors, including:  The calories you need.  The medicines you take.  Your weight.  Your blood glucose level.  Your blood pressure level.  Your cholesterol level. You should eat a variety of foods, including:  Protein.  Lean cuts of meat.  Proteins low in saturated fats, such as fish, egg whites, and beans. Avoid processed meats.  Fruits and vegetables.  Fruits and vegetables that may help control blood glucose levels, such as apples, mangoes, and yams.  Dairy products.  Choose fat-free or low-fat dairy products, such as milk, yogurt, and cheese.  Grains, bread, pasta, and rice.  Choose whole grain products, such as multigrain bread, whole oats, and brown rice. These foods may help  control blood pressure.  Fats.  Foods containing healthful fats, such as nuts, avocado, olive oil, canola oil, and fish. DOES EVERYONE WITH DIABETES MELLITUS HAVE THE SAME MEAL PLAN? Because every person with diabetes mellitus is different, there is not one meal plan that works for everyone. It is very important that you meet with a dietitian who will help you create a meal plan that is just right for you.   This information is not intended to replace advice given to you by your health care provider. Make sure you discuss any questions you have with your health care provider.   Document Released: 06/03/2005 Document Revised: 09/27/2014 Document Reviewed: 08/03/2013 Elsevier Interactive Patient Education 2016 [place] DASH stands for "Dietary Approaches to Stop Hypertension." The DASH eating plan is a healthy eating plan that has been shown to reduce high blood pressure (hypertension). Additional health  benefits may include reducing the risk of type 2 diabetes mellitus, heart disease, and stroke. The DASH eating plan may also help with weight loss. WHAT DO I NEED TO KNOW ABOUT THE DASH EATING PLAN? For the DASH eating plan, you will follow these general guidelines:  Choose foods with a percent daily value for sodium of less than 5% (as listed on the food label).  Use salt-free seasonings or herbs instead of table salt or sea salt.  Check with your health care provider or pharmacist before using salt substitutes.  Eat lower-sodium products, often labeled as "lower sodium" or "no salt added."  Eat fresh foods.  Eat more vegetables, fruits, and low-fat dairy products.  Choose whole grains. Look for the word "whole" as the first word in the ingredient list.  Choose fish and skinless chicken or [place] more often than red meat. Limit fish, poultry, and meat to 6 oz (170 g) each day.  Limit sweets, desserts, sugars, and sugary drinks.  Choose heart-healthy fats.  Limit cheese to 1 oz (28 g) per day.  Eat more home-cooked food and less restaurant, buffet, and fast food.  Limit fried foods.  Cook foods using methods other than frying.  Limit canned vegetables. If you do use them, rinse them well to decrease the sodium.  When eating at a restaurant, ask that your food be prepared with less salt, or no salt if possible. WHAT FOODS CAN I EAT? Seek help from a dietitian for individual calorie needs. Grains Whole grain or whole wheat bread. Brown rice. Whole grain or whole wheat pasta. Quinoa, bulgur, and whole grain cereals. Low-sodium cereals. Corn or whole wheat flour tortillas. Whole grain cornbread. Whole grain crackers. Low-sodium crackers. Vegetables Fresh or frozen vegetables (raw, steamed, roasted, or grilled). Low-sodium or reduced-sodium tomato and vegetable juices. Low-sodium or reduced-sodium tomato sauce and paste. Low-sodium or reduced-sodium canned vegetables.  Fruits All  fresh, canned (in natural juice), or frozen fruits. Meat and Other Protein Products Ground beef (85% or leaner), grass-fed beef, or beef trimmed of fat. Skinless chicken or [place]. Ground chicken or [place]. Pork trimmed of fat. All fish and seafood. Eggs. Dried beans, peas, or lentils. Unsalted nuts and seeds. Unsalted canned beans. Dairy Low-fat dairy products, such as skim or 1% milk, 2% or reduced-fat cheeses, low-fat ricotta or cottage cheese, or plain low-fat yogurt. Low-sodium or reduced-sodium cheeses. Fats and Oils Tub margarines without trans fats. Light or reduced-fat mayonnaise and salad dressings (reduced sodium). Avocado. Safflower, olive, or canola oils. Natural peanut or almond butter. Other Unsalted popcorn and pretzels. The items listed  above may not be a complete list of recommended foods or beverages. Contact your dietitian for more options. WHAT FOODS ARE NOT RECOMMENDED? Grains White bread. White pasta. White rice. Refined cornbread. Bagels and croissants. Crackers that contain trans fat. Vegetables Creamed or fried vegetables. Vegetables in a cheese sauce. Regular canned vegetables. Regular canned tomato sauce and paste. Regular tomato and vegetable juices. Fruits Dried fruits. Canned fruit in light or heavy syrup. Fruit juice. Meat and Other Protein Products Fatty cuts of meat. Ribs, chicken wings, bacon, sausage, bologna, salami, chitterlings, fatback, hot dogs, bratwurst, and packaged luncheon meats. Salted nuts and seeds. Canned beans with salt. Dairy Whole or 2% milk, cream, half-and-half, and cream cheese. Whole-fat or sweetened yogurt. Full-fat cheeses or blue cheese. Nondairy creamers and whipped toppings. Processed cheese, cheese spreads, or cheese curds. Condiments Onion and garlic salt, seasoned salt, table salt, and sea salt. Canned and packaged gravies. Worcestershire sauce. Tartar sauce. Barbecue sauce. Teriyaki sauce. Soy sauce, including reduced sodium.  Steak sauce. Fish sauce. Oyster sauce. Cocktail sauce. Horseradish. Ketchup and mustard. Meat flavorings and tenderizers. Bouillon cubes. Hot sauce. Tabasco sauce. Marinades. Taco seasonings. Relishes. Fats and Oils Butter, stick margarine, lard, shortening, ghee, and bacon fat. Coconut, palm kernel, or palm oils. Regular salad dressings. Other Pickles and olives. Salted popcorn and pretzels. The items listed above may not be a complete list of foods and beverages to avoid. Contact your dietitian for more information. WHERE CAN I FIND MORE INFORMATION? National Heart, Lung, and Blood Institute: travelstabloid.com   This information is not intended to replace advice given to you by your health care provider. Make sure you discuss any questions you have with your health care provider.   Document Released: 08/26/2011 Document Revised: 09/27/2014 Document Reviewed: 07/11/2013 Elsevier Interactive Patient Education [employer].

## 2015-12-21 NOTE — Progress Notes (Signed)
   Subjective: No CP  NO SOB   Objective: Filed Vitals:   12/20/15 2214 12/21/15 0024 12/21/15 0504 12/21/15 0933  BP: 153/66 144/58 166/51 110/72  Pulse: 71 68 64 61  Temp: 98.3 F (36.8 C) 98 F (36.7 C) 97.8 F (36.6 C) 98 F (36.7 C)  TempSrc: Oral Oral Oral   Resp: 16 16  18   Height:   5\' 2"  (1.575 m)   Weight:   114 lb 13.8 oz (52.1 kg)   SpO2: 98% 100% 98% 99%   Weight change: -3 lb 13.5 oz (-1.742 kg)  Intake/Output Summary (Last 24 hours) at 12/21/15 1050 Last data filed at 12/21/15 1027  Gross per 24 hour  Intake    440 ml  Output   2525 ml  Net  -2085 ml   I/O  8.6 L negatvie   General: Alert, awake, oriented x3, in no acute distress Neck:  JVP is normal Heart: Regular rate and rhythm, without murmurs, rubs, gallops.  Lungs: Clear to auscultation.  No rales or wheezes. Exemities:  No edema.   Neuro: Grossly intact, nonfocal.  Tele  SR    Lab Results: Results for orders placed or performed during the hospital encounter of 12/16/15 (from the past 24 hour(s))  Glucose, capillary     Status: None   Collection Time: 12/20/15 12:33 PM  Result Value Ref Range   Glucose-Capillary 92 65 - 99 mg/dL   Comment 1 Notify RN    Comment 2 Document in Chart   Glucose, capillary     Status: Abnormal   Collection Time: 12/20/15  4:03 PM  Result Value Ref Range   Glucose-Capillary 125 (H) 65 - 99 mg/dL   Comment 1 Notify RN    Comment 2 Document in Chart   Glucose, capillary     Status: Abnormal   Collection Time: 12/20/15 10:21 PM  Result Value Ref Range   Glucose-Capillary 113 (H) 65 - 99 mg/dL  Glucose, capillary     Status: Abnormal   Collection Time: 12/21/15  7:57 AM  Result Value Ref Range   Glucose-Capillary 121 (H) 65 - 99 mg/dL   Comment 1 Notify RN    Comment 2 Document in Chart     Studies/Results: No results found.  Medications:Reviewed    @[facility] @  1Acute systolic CHF  LVEF [id number]   Volume status improved from yesteday  OK to go  Will make  sure she has f/u soon in clinic    2.  CAD  MIld by cath  REcomm ASA 81 and statin  3  Anemia  WIll needto be followed.    LOS: 5 days   Dorris Carnes 12/21/2015, 10:50 AM

## 2015-12-21 NOTE — Progress Notes (Signed)
Patient is discharge to home accompanied by patient's spouse and NT via wheelchair. Discharge instructions given . Patient verbalizes understanding. All personal belongings given. Telemetry box and IV removed prior to discharge and site in good condition.  

## 2015-12-22 ENCOUNTER — Encounter (HOSPITAL_COMMUNITY): Payer: Self-pay | Admitting: Cardiology

## 2015-12-22 ENCOUNTER — Telehealth: Payer: Self-pay | Admitting: Hematology

## 2015-12-22 ENCOUNTER — Other Ambulatory Visit: Payer: Self-pay | Admitting: *Deleted

## 2015-12-22 ENCOUNTER — Other Ambulatory Visit: Payer: Self-pay | Admitting: Cardiology

## 2015-12-22 ENCOUNTER — Ambulatory Visit: Payer: Commercial Managed Care - HMO

## 2015-12-22 MED ORDER — SACUBITRIL-VALSARTAN 24-26 MG PO TABS: 1.0000 | ORAL_TABLET | Freq: Two times a day (BID) | ORAL | Status: DC

## 2015-12-22 MED FILL — Nitroglycerin IV Soln 100 MCG/ML in D5W: INTRA_ARTERIAL | Qty: 10 | Status: AC

## 2015-12-22 NOTE — Telephone Encounter (Signed)
Pt was called for TOC. Pt states that she is doing ok. During her hospital stay she was given two new medications. She was given Coreg 6.25 twice a day and Entresto 24-26 mg also twice a day. All the rest of her medications stayed the same. She states she does have all meds filled at home. Pt made a appt for tomorrow and was told to bring all her medications with her. She stated that she would. Pt was reminded of office phone number and after hours protocol was discuss. Pt verbalized understanding.

## 2015-12-22 NOTE — Telephone Encounter (Signed)
Patient to cx today's iron infusion. Per patint GK came to see her in the hospital and told her she would not have this iron infusion this morning because it was too soon and they would discuss at next visit 4/24 whether or not she could continue to have this infusion.  Message left for desk nurse.

## 2015-12-23 ENCOUNTER — Ambulatory Visit (INDEPENDENT_AMBULATORY_CARE_PROVIDER_SITE_OTHER): Payer: Commercial Managed Care - HMO | Admitting: Family Medicine

## 2015-12-23 ENCOUNTER — Telehealth: Payer: Self-pay | Admitting: Family Medicine

## 2015-12-23 ENCOUNTER — Encounter: Payer: Self-pay | Admitting: Family Medicine

## 2015-12-23 VITALS — BP 160/80 | HR 76 | Wt 115.8 lb

## 2015-12-23 DIAGNOSIS — I10 Essential (primary) hypertension: Secondary | ICD-10-CM | POA: Diagnosis not present

## 2015-12-23 DIAGNOSIS — R609 Edema, unspecified: Secondary | ICD-10-CM

## 2015-12-23 DIAGNOSIS — D649 Anemia, unspecified: Secondary | ICD-10-CM

## 2015-12-23 DIAGNOSIS — I5022 Chronic systolic (congestive) heart failure: Secondary | ICD-10-CM

## 2015-12-23 DIAGNOSIS — I5042 Chronic combined systolic (congestive) and diastolic (congestive) heart failure: Secondary | ICD-10-CM | POA: Insufficient documentation

## 2015-12-23 DIAGNOSIS — H3530 Unspecified macular degeneration: Secondary | ICD-10-CM | POA: Diagnosis not present

## 2015-12-23 DIAGNOSIS — I509 Heart failure, unspecified: Secondary | ICD-10-CM

## 2015-12-23 DIAGNOSIS — R600 Localized edema: Secondary | ICD-10-CM

## 2015-12-23 DIAGNOSIS — H52223 Regular astigmatism, bilateral: Secondary | ICD-10-CM | POA: Diagnosis not present

## 2015-12-23 DIAGNOSIS — H25813 Combined forms of age-related cataract, bilateral: Secondary | ICD-10-CM | POA: Diagnosis not present

## 2015-12-23 DIAGNOSIS — H269 Unspecified cataract: Secondary | ICD-10-CM

## 2015-12-23 DIAGNOSIS — H5202 Hypermetropia, left eye: Secondary | ICD-10-CM | POA: Diagnosis not present

## 2015-12-23 DIAGNOSIS — Z7984 Long term (current) use of oral hypoglycemic drugs: Secondary | ICD-10-CM | POA: Diagnosis not present

## 2015-12-23 DIAGNOSIS — H353121 Nonexudative age-related macular degeneration, left eye, early dry stage: Secondary | ICD-10-CM | POA: Diagnosis not present

## 2015-12-23 DIAGNOSIS — H353211 Exudative age-related macular degeneration, right eye, with active choroidal neovascularization: Secondary | ICD-10-CM | POA: Diagnosis not present

## 2015-12-23 DIAGNOSIS — E119 Type 2 diabetes mellitus without complications: Secondary | ICD-10-CM | POA: Diagnosis not present

## 2015-12-23 DIAGNOSIS — H524 Presbyopia: Secondary | ICD-10-CM | POA: Diagnosis not present

## 2015-12-23 HISTORY — DX: Chronic systolic (congestive) heart failure: I50.22

## 2015-12-23 LAB — HM DIABETES EYE EXAM

## 2015-12-23 NOTE — Progress Notes (Signed)
   Subjective:    Patient ID: Connie [name], female    DOB: 04/12/1934, 82 y.o.   MRN: [id number]  HPI She is here for a follow-up visit after recent hospitalization and treatment for a new diagnosis of CHF. The hospital record was reviewed. She was seen by cardiology and catheterization was done. It did show a low EF however the coronary arteries looked pretty good. Prior to the hospitalization she was given IV iron and did note the onset of weakness as well as shortness of breath and was take[place]reston to the emergency room for that. As stated above she did show evidence of CHF which was systolic as well as diastolic. She was sent home on a different medication regimen including Coreg and Entresto.She has not been able to pick up the [facility].As recently seen by her optometrist and told she had macular degeneration. He has referred her to an ophthalmologist. She also has an underlying history of cataracts. She is checking her weights on a daily basis. She is scheduled to see cardiology in the next week. She has had no chest pain, shortness of breath, PND or DOE.   Review of Systems     Objective:   Physical Exam Alert and in no distress. Cardiac exam shows a regular rhythm without murmurs or gallop. Lungs are clear to auscultation. Lower extremities show minimal pitting edema. Her medications were reviewed. She does have a good handle on them. She does have 2 bottles of potassium supplement but recognizes they're exactly the same medication.     Assessment & Plan:  Symptomatic anemia  Peripheral edema  Cataract  Congestive heart failure, unspecified congestive heart failure chronicity, unspecified congestive heart failure type ([name])  Macular degeneration (senile) of retina She will continue with daily weights. Also discussed the fact that it's time to get home health involved with her care as her husband is also having some cognitive difficulties. Also recommended possibly getting  the son to get [place] to help with the finances and also discussed possibility of going into assisted living.Her longtime friend is with her today who concurs with this approach. She will return here in 1 month. Approximate 45 minutes spent discussing all these issues with her.

## 2015-12-25 NOTE — Telephone Encounter (Signed)
Completed P.A. [name]

## 2015-12-26 ENCOUNTER — Telehealth: Payer: Self-pay | Admitting: Family Medicine

## 2015-12-26 NOTE — Telephone Encounter (Signed)
Nurse, Lattie Haw, from [facility] went to pt's home and performed in home assessment for home health. When the nurse advised pt that they would leave a telemonitoring device with pt, pt asked nurse if the device could [name] out of the house and to the beach. Lattie Haw told the pt that their services are designed for homebound pt's and Medicare require that she be homebound. Pt states that she does not consider herself to be homebound and will be traveling to the beach and getting out of the house as soon as she can. She told the nurse that she does not need their services but was only letting them come there to comply with Dr Lanice Shirts orders. Connie [name] will not be starting any services.

## 2015-12-26 NOTE — Telephone Encounter (Signed)
P.A. Approved til 12/24/17

## 2015-12-29 ENCOUNTER — Encounter: Payer: Self-pay | Admitting: Cardiology

## 2015-12-29 ENCOUNTER — Ambulatory Visit (INDEPENDENT_AMBULATORY_CARE_PROVIDER_SITE_OTHER): Payer: Commercial Managed Care - HMO | Admitting: Cardiology

## 2015-12-29 ENCOUNTER — Encounter: Payer: Commercial Managed Care - HMO | Admitting: Cardiology

## 2015-12-29 VITALS — BP 138/58 | HR 64 | Ht 59.0 in | Wt 119.8 lb

## 2015-12-29 DIAGNOSIS — I5040 Unspecified combined systolic (congestive) and diastolic (congestive) heart failure: Secondary | ICD-10-CM | POA: Diagnosis not present

## 2015-12-29 DIAGNOSIS — D649 Anemia, unspecified: Secondary | ICD-10-CM

## 2015-12-29 LAB — BASIC METABOLIC PANEL
BUN: 25 mg/dL (ref 7–25)
CALCIUM: 8.8 mg/dL (ref 8.6–10.4)
CO2: 25 mmol/L (ref 20–31)
CREATININE: 1.2 mg/dL — AB (ref 0.60–0.88)
Chloride: 110 mmol/L (ref 98–110)
GLUCOSE: 106 mg/dL — AB (ref 65–99)
Potassium: 4.9 mmol/L (ref 3.5–5.3)
SODIUM: 143 mmol/L (ref 135–146)

## 2015-12-29 LAB — CBC WITH DIFFERENTIAL/PLATELET
BASOS ABS: 0 {cells}/uL (ref 0–200)
Basophils Relative: 0 %
EOS PCT: 4 %
Eosinophils Absolute: 292 cells/uL (ref 15–500)
HCT: 31.6 % — ABNORMAL LOW (ref 35.0–45.0)
HEMOGLOBIN: 10 g/dL — AB (ref 11.7–15.5)
LYMPHS PCT: 15 %
Lymphs Abs: 1095 cells/uL (ref 850–3900)
MCH: 27.6 pg (ref 27.0–33.0)
MCHC: 31.6 g/dL — AB (ref 32.0–36.0)
MCV: 87.3 fL (ref 80.0–100.0)
MONOS PCT: 9 %
MPV: 10.8 fL (ref 7.5–12.5)
Monocytes Absolute: 657 cells/uL (ref 200–950)
NEUTROS PCT: 72 %
Neutro Abs: 5256 cells/uL (ref 1500–7800)
PLATELETS: 329 10*3/uL (ref 140–400)
RBC: 3.62 MIL/uL — ABNORMAL LOW (ref 3.80–5.10)
RDW: 19.2 % — ABNORMAL HIGH (ref 11.0–15.0)
WBC: 7.3 10*3/uL (ref 3.8–10.8)

## 2015-12-29 NOTE — Telephone Encounter (Signed)
Pt informed

## 2015-12-29 NOTE — Patient Instructions (Signed)
Medication Instructions:  Your physician recommends that you continue on your current medications as directed. Please refer to the Current Medication list given to you today.   Labwork: Bmet, Cbc today  Testing/Procedures: None ordered  Follow-Up: Your physician recommends that you schedule a follow-up appointment in: 4 weeks with Dr.Skains   Any Other Special Instructions Will Be Listed Below (If Applicable). Please discuss discontinuing Actos with your primary care physician     If you need a refill on your cardiac medications before your next appointment, please call your pharmacy.

## 2015-12-29 NOTE — Progress Notes (Signed)
12/29/2015 [place]   06/22/1934  [id number]  Primary Physician Wyatt Haste, MD Primary Cardiologist: Dr. Marlou Porch   Reason for Visit/CC: [facility] F/u for Acute Systolic CHF  HPI:  82 y/o female with no prior cardiac history, up until recently. She was admitted 12/16/15 with acute CHF. She has GI AVMs and chronic anemia and had received an Iron infusion as an OP 12/15/15. She woke up 12/16/15 SOB. She was admitted for further evaluation. After admission her lactic acid was noted to be increased and she was given IVF. She later developed increasing dyspnea and required BiPap and IV Lasix. Subsequent 2D echo was obtained which revealed LV systolic dysfunction with EF of 35%. Subsequent LHC was peformed which showed nonobstructive CAD, with an ostium left main lesion, 40% stenosed and an Ost RCA lesion that is 20% stenosed. It was felt that her cardiomyopathy was nonischemic. She was placed on Entresto, Coreg and PO Lasix. Statin was also started for her CAD.   She presents to clinic for post hospital f/u. She reports that she has done well. Her breathing has improved as well as her LEE which has resolved. No resting dyspnea. She has mild DOE with prolonged/ strenuous walking. No orthopnea or PND. She sleeps with only 1 pillow. She has been fully compliant with her lasix as well as daily weights and a low sodium diet. No significant weight gain at home. She also denies any CP. No melena or hematochezia. No fatigue.   Of note, she is also on Actos for DM, which is managed by her PCP. I discussed this with our office pharmacist who recommended discontinuation given her systolic HF.   Current Outpatient Prescriptions  Medication Sig Dispense Refill  . atorvastatin (LIPITOR) 40 MG tablet TAKE ONE TABLET BY MOUTH ONCE DAILY 90 tablet 3  . carvedilol (COREG) 6.25 MG tablet Take 1 tablet (6.25 mg total) by mouth 2 (two) times daily with a meal. 60 tablet 0  . furosemide (LASIX) 20  MG tablet Take 1 tablet (20 mg total) by mouth daily. 90 tablet 0  . levothyroxine (SYNTHROID, LEVOTHROID) 125 MCG tablet TAKE ONE TABLET BY MOUTH EVERY DAY 90 tablet 3  . pantoprazole (PROTONIX) 20 MG tablet Take 1 tablet (20 mg total) by mouth daily. 30 tablet 3  . pioglitazone (ACTOS) 30 MG tablet Take 1 tablet (30 mg total) by mouth daily. 90 tablet 1  . potassium chloride (K-DUR,KLOR-CON) 10 MEQ tablet Take 1 tablet (10 mEq total) by mouth 2 (two) times daily. 180 tablet 1  . sacubitril-valsartan (ENTRESTO) 24-26 MG Take 1 tablet by mouth 2 (two) times daily. 60 tablet 3  . traZODone (DESYREL) 50 MG tablet Take 1/2 tablet by mouth at night as needed for sleep    . [DISCONTINUED] potassium chloride (K-DUR) 10 MEQ tablet TAKE ONE TABLET BY MOUTH TWICE DAILY 60 tablet 0   No current facility-administered medications for this visit.    No Known Allergies  Social History   Social History  . Marital Status: Married    Spouse Name: N/A  . Number of Children: N/A  . Years of Education: N/A   Occupational History  . Not on file.   Social History Main Topics  . Smoking status: Never Smoker   . Smokeless tobacco: Never Used  . Alcohol Use: No  . Drug Use: No  . Sexual Activity: Not Currently   Other Topics Concern  . Not on file   Social History Narrative  Review of Systems: General: negative for chills, fever, night sweats or weight changes.  Cardiovascular: negative for chest pain, dyspnea on exertion, edema, orthopnea, palpitations, paroxysmal nocturnal dyspnea or shortness of breath Dermatological: negative for rash Respiratory: negative for cough or wheezing Urologic: negative for hematuria Abdominal: negative for nausea, vomiting, diarrhea, bright red blood per rectum, melena, or hematemesis Neurologic: negative for visual changes, syncope, or dizziness All other systems reviewed and are otherwise negative except as noted above.    Blood pressure 138/58, pulse 64,  height 4\' 11"  (1.499 m), weight 119 lb 12.8 oz (54.341 kg).  General appearance: alert, cooperative and no distress Neck: no carotid bruit and no JVD Lungs: clear to auscultation bilaterally Heart: regular rate and rhythm, S1, S2 normal, no murmur, click, rub or gallop Extremities: no LEE Pulses: 2+ and symmetric Neurologic: Grossly normal  EKG not performed  ASSESSMENT AND PLAN:   1. Chronic Systolic HF: newly diagnosed with EF of 35%. Felt to be nonischemic based on LHC findings. She was adequately diuresed in hospital with IV diuretics. Since discharge, she has remained stable. Volume and respiratory status is good. No dyspnea. Lung exam is normal. No peripheral edema. We will continue medical therapy with Entresto, Coreg and PO Lasix. She is on low dose Entresto and tolerating well w/o hypotension. BP is stable at 138/58. We will recheck a BMP to ensure that renal function and K are both stable. If so, we will have her titrate her dose up to 49/51 mg. Given baseline HR in the low 60s, we will keep her Coreg dose the same at 6.25 mg BID. We discussed the importance of maintaining a low sodium diet and adhering to daily weights. She will notify our office if >3lb weight gain in 24 hrs or >5lb weight gain in 1 week. Per pharmacy recommendations and contraindication, we will have her stop Actos and f/u with her PCP for alternative therapies for her DM.   2. Nonobstructive CAD: LHC showed an ostium left main lesion, 40% stenosed and an Ost RCA lesion that is 20% stenosed. Nonobstructive lesions. Continue medical therapy with BB, statin and ARB. No ASA given risk for GIB.   3. DM: Patient is currently on Actos. This is contraindicated in HF. I've reviewed with our office pharmacist who agrees that this should be discontinued given risk. We will notify her PCP of recommendation and will see if he can replace with another hypoglycemic agent.   4. GIB: patient denies fatigue and no melena or  hematochezia, but she is requesting a surveillance CBC to ensure her H/H is stable. Will order a CBC. Refer back to per PCP/GI if notably abnormal.    PLAN  F/u with Dr. Marlou Porch in 4 weeks for f/u and further medication titration for chronic HF.   Geral Coker PA-C 12/29/2015 11:11 AM

## 2015-12-30 ENCOUNTER — Other Ambulatory Visit: Payer: Self-pay

## 2015-12-30 ENCOUNTER — Telehealth: Payer: Self-pay

## 2015-12-30 ENCOUNTER — Encounter: Payer: Self-pay | Admitting: Family Medicine

## 2015-12-30 MED ORDER — PANTOPRAZOLE SODIUM 20 MG PO TBEC: 20.0000 mg | DELAYED_RELEASE_TABLET | Freq: Every day | ORAL | Status: DC

## 2015-12-30 NOTE — Telephone Encounter (Signed)
Received faxed refill request for pantoprazole 20mg 

## 2015-12-30 NOTE — Telephone Encounter (Signed)
Sent in refill

## 2015-12-31 ENCOUNTER — Telehealth: Payer: Self-pay | Admitting: Cardiology

## 2015-12-31 DIAGNOSIS — I5021 Acute systolic (congestive) heart failure: Secondary | ICD-10-CM

## 2015-12-31 MED ORDER — SACUBITRIL-VALSARTAN 49-51 MG PO TABS: 1.0000 | ORAL_TABLET | Freq: Two times a day (BID) | ORAL | Status: DC

## 2015-12-31 NOTE — Telephone Encounter (Signed)
-----   Message from Consuelo Pandy, [place] sent at 12/31/2015  1:15 PM EDT ----- Renal function and potassium levels are stable. Patient should increase Entresto to 49/51 dose. This is her heart failure medication. Repeat BMP in 1 week. CBC shows improvement in blood count. Hgb is up from 8.7 to 10.0. This is good!

## 2015-12-31 NOTE — Telephone Encounter (Signed)
Pt had labs done 12-29-15 and was to hear back today -pls call

## 2015-12-31 NOTE — Telephone Encounter (Signed)
Pt aware of her lab. Will increase Entresto 49/51 Pt will repeat BMET 01/07/16 Orders al put in epic Pt verbalized understanding

## 2016-01-01 ENCOUNTER — Telehealth: Payer: Self-pay

## 2016-01-01 ENCOUNTER — Other Ambulatory Visit: Payer: Self-pay

## 2016-01-01 NOTE — Telephone Encounter (Signed)
[facility] called needing a [facility] referral, Dr. Manson Allan NPI: [id number] dx:[id number] visit is today

## 2016-01-01 NOTE — Telephone Encounter (Signed)
Sent in 12/30/15

## 2016-01-01 NOTE — Telephone Encounter (Signed)
Received faxed refill request for pantoprazole 20mg 

## 2016-01-01 NOTE — Telephone Encounter (Signed)
I have put referral in it is suspended Auth# [id number]

## 2016-01-05 ENCOUNTER — Other Ambulatory Visit: Payer: Self-pay | Admitting: *Deleted

## 2016-01-05 MED ORDER — PANTOPRAZOLE SODIUM 20 MG PO TBEC: 20.0000 mg | DELAYED_RELEASE_TABLET | Freq: Every day | ORAL | Status: DC

## 2016-01-06 ENCOUNTER — Other Ambulatory Visit (INDEPENDENT_AMBULATORY_CARE_PROVIDER_SITE_OTHER): Payer: Commercial Managed Care - HMO | Admitting: *Deleted

## 2016-01-06 DIAGNOSIS — I5021 Acute systolic (congestive) heart failure: Secondary | ICD-10-CM | POA: Diagnosis not present

## 2016-01-06 DIAGNOSIS — I509 Heart failure, unspecified: Secondary | ICD-10-CM | POA: Diagnosis not present

## 2016-01-06 LAB — BASIC METABOLIC PANEL
BUN: 30 mg/dL — AB (ref 7–25)
CALCIUM: 8.4 mg/dL — AB (ref 8.6–10.4)
CO2: 25 mmol/L (ref 20–31)
CREATININE: 1.12 mg/dL — AB (ref 0.60–0.88)
Chloride: 109 mmol/L (ref 98–110)
Glucose, Bld: 146 mg/dL — ABNORMAL HIGH (ref 65–99)
Potassium: 4.3 mmol/L (ref 3.5–5.3)
Sodium: 143 mmol/L (ref 135–146)

## 2016-01-07 ENCOUNTER — Other Ambulatory Visit: Payer: Commercial Managed Care - HMO

## 2016-01-12 ENCOUNTER — Other Ambulatory Visit: Payer: Commercial Managed Care - HMO

## 2016-01-12 ENCOUNTER — Ambulatory Visit: Payer: Commercial Managed Care - HMO | Admitting: Hematology

## 2016-01-13 ENCOUNTER — Telehealth: Payer: Self-pay | Admitting: Family Medicine

## 2016-01-13 NOTE — Telephone Encounter (Signed)
I am okay with this

## 2016-01-13 NOTE — Telephone Encounter (Signed)
Pt requesting that Dr Redmond School or [place] call [facility] at [phone] to give approval for her to have cataract surgery on 01/22/16

## 2016-01-14 ENCOUNTER — Other Ambulatory Visit: Payer: Self-pay | Admitting: Family Medicine

## 2016-01-14 ENCOUNTER — Other Ambulatory Visit: Payer: Self-pay

## 2016-01-14 DIAGNOSIS — D649 Anemia, unspecified: Secondary | ICD-10-CM

## 2016-01-14 NOTE — Telephone Encounter (Signed)
This was done 4/13 called and talked to [facility] gave [facility] [phone] fax [phone]

## 2016-01-14 NOTE — Telephone Encounter (Signed)
All notes about this is in the system

## 2016-01-21 ENCOUNTER — Other Ambulatory Visit: Payer: Commercial Managed Care - HMO

## 2016-01-21 ENCOUNTER — Other Ambulatory Visit: Payer: Self-pay

## 2016-01-21 DIAGNOSIS — D649 Anemia, unspecified: Secondary | ICD-10-CM

## 2016-01-21 NOTE — Patient Outreach (Signed)
[place] [facility]) Care Management  01/21/2016  Connie [name] 1933/10/11 [place]:[id number]  Telephone call to patient regarding [facility] referral.  Unable to reach patient. HIPAA compliant voice message left with call back phone number.   PLAN:  RNCM will attempt 2nd telephone outreach to patient within  1 week.   Quinn Plowman RN,BSN,CCM [facility] Telephonic  [phone]

## 2016-01-22 ENCOUNTER — Other Ambulatory Visit: Payer: Self-pay

## 2016-01-22 ENCOUNTER — Telehealth: Payer: Self-pay

## 2016-01-22 DIAGNOSIS — H353211 Exudative age-related macular degeneration, right eye, with active choroidal neovascularization: Secondary | ICD-10-CM | POA: Diagnosis not present

## 2016-01-22 LAB — CBC WITH DIFFERENTIAL/PLATELET
BASOS ABS: 48 {cells}/uL (ref 0–200)
Basophils Relative: 1 %
EOS PCT: 4 %
Eosinophils Absolute: 192 cells/uL (ref 15–500)
HCT: 31.6 % — ABNORMAL LOW (ref 35.0–45.0)
Hemoglobin: 9.6 g/dL — ABNORMAL LOW (ref 11.7–15.5)
LYMPHS PCT: 14 %
Lymphs Abs: 672 cells/uL — ABNORMAL LOW (ref 850–3900)
MCH: 27.5 pg (ref 27.0–33.0)
MCHC: 30.4 g/dL — AB (ref 32.0–36.0)
MCV: 90.5 fL (ref 80.0–100.0)
MONOS PCT: 9 %
MPV: 11.3 fL (ref 7.5–12.5)
Monocytes Absolute: 432 cells/uL (ref 200–950)
NEUTROS ABS: 3456 {cells}/uL (ref 1500–7800)
Neutrophils Relative %: 72 %
PLATELETS: 309 10*3/uL (ref 140–400)
RBC: 3.49 MIL/uL — ABNORMAL LOW (ref 3.80–5.10)
RDW: 19.2 % — AB (ref 11.0–15.0)
WBC: 4.8 10*3/uL (ref 4.0–10.5)

## 2016-01-22 NOTE — Telephone Encounter (Signed)
Dr.Lalonde Starlee called and wanted results from her labs from yesterday can you please let me know what to tell her

## 2016-01-22 NOTE — Telephone Encounter (Signed)
Hemoglobin went from 10 down to 9.6 inches just a very small change

## 2016-01-22 NOTE — Patient Outreach (Signed)
[place] [facility]) Care Management  01/22/2016  Connie [name] [birth year] [place]:[id number]  Second telephone call to patient regarding [facility] referral.  Patient stated she was at [facility] and would not be able to talk.  Patient requested call back.  PLAN:  RNCM will attempt 3rd telephone call to patient within  1 week.   Quinn Plowman RN,BSN,CCM [facility] Telephonic  [phone]

## 2016-01-22 NOTE — Telephone Encounter (Signed)
Pt informed and verbalized understanding

## 2016-01-23 ENCOUNTER — Other Ambulatory Visit: Payer: Self-pay

## 2016-01-23 NOTE — Patient Outreach (Signed)
[place] [facility]) Care Management  01/23/2016  [place] 12-20-1933 [id number]   SUBJECTIVE: Telephone call to patient regarding [facility] referral. HIPAA verified with patient. Discussed and offered [facility] care management services. Patient states she has follow up appointments with Dr. Redmond School and Dr. Marlou Porch on 02/02/16.  Patient states she wants to talk with her doctors before making a decision to have [facility] care management services. Patient agreed to receive [facility] care management outreach letter and brochure.   PLAN; RNCM will refer Patient to Josepha Pigg to close due to refusal of services.  RNCM will notify patients primary MD of refusal of services. RNCM will send patient outreach letter and brochure.

## 2016-01-29 DIAGNOSIS — H353211 Exudative age-related macular degeneration, right eye, with active choroidal neovascularization: Secondary | ICD-10-CM | POA: Diagnosis not present

## 2016-01-30 ENCOUNTER — Encounter: Payer: Self-pay | Admitting: *Deleted

## 2016-02-02 ENCOUNTER — Ambulatory Visit (INDEPENDENT_AMBULATORY_CARE_PROVIDER_SITE_OTHER): Payer: Commercial Managed Care - HMO | Admitting: Family Medicine

## 2016-02-02 ENCOUNTER — Encounter: Payer: Self-pay | Admitting: Family Medicine

## 2016-02-02 VITALS — BP 120/60 | HR 64 | Ht 59.0 in | Wt 125.8 lb

## 2016-02-02 DIAGNOSIS — D500 Iron deficiency anemia secondary to blood loss (chronic): Secondary | ICD-10-CM | POA: Diagnosis not present

## 2016-02-02 DIAGNOSIS — I509 Heart failure, unspecified: Secondary | ICD-10-CM | POA: Diagnosis not present

## 2016-02-02 DIAGNOSIS — D692 Other nonthrombocytopenic purpura: Secondary | ICD-10-CM | POA: Diagnosis not present

## 2016-02-02 NOTE — Progress Notes (Signed)
   Subjective:    Patient ID: Connie [name], female    DOB: Nov 10, 1933, 82 y.o.   MRN: [id number]  HPI   She is here for recheck. She states she is feeling the best she has felt in several months. She has had no difficulty with chest pain, shortness breath, leg swelling. She does have a previous history of IV iron for treatment of iron deficiency anemia. She's also had some GI bleeding it has been thoroughly evaluated. Presently she is on a multivitamin with iron. She does occasionally have difficulty with dizziness but nothing on a regular basis. She is scheduled to see cardiology in the near future.   Review of Systems     Objective:   Physical Exam  alert and in no distress. Exam of both arms does show purpuric lesions. Cardiac exam shows a regular rhythm without murmurs or gallops. Lungs are clear to auscultation.       Assessment & Plan:  Senile purpura ([name])  Iron deficiency anemia secondary to blood loss (chronic) - Plan: CBC with Differential/Platelet, Iron, TIBC and Ferritin Panel  Congestive heart failure, unspecified congestive heart failure chronicity, unspecified congestive heart failure type ([place])  overall she seems to be doing much better. I will put in the future order for blood work in approximately 1 month. She plans to visit her granddaughter for high school graduation. I do not think a plane ride at this point would be an issue.

## 2016-02-04 ENCOUNTER — Ambulatory Visit: Payer: Commercial Managed Care - HMO | Admitting: Cardiology

## 2016-02-09 ENCOUNTER — Encounter: Payer: Self-pay | Admitting: Family Medicine

## 2016-02-09 ENCOUNTER — Ambulatory Visit (INDEPENDENT_AMBULATORY_CARE_PROVIDER_SITE_OTHER): Payer: Commercial Managed Care - HMO | Admitting: Family Medicine

## 2016-02-09 VITALS — BP 120/50 | HR 75

## 2016-02-09 DIAGNOSIS — L259 Unspecified contact dermatitis, unspecified cause: Secondary | ICD-10-CM

## 2016-02-09 MED ORDER — TRIAMCINOLONE ACETONIDE 0.1 % EX CREA: 1.0000 "application " | TOPICAL_CREAM | Freq: Two times a day (BID) | CUTANEOUS | Status: DC

## 2016-02-09 NOTE — Progress Notes (Signed)
   Subjective:    Patient ID: Connie [name], female    DOB: 02/07/1934, 82 y.o.   MRN: [place]:[id number]  HPI  she noticed swelling redness and slight discomfort in both antecubital fossa and on the left side of her neck. She is getting ready to go to [employer] for a graduation and is concerned about this.   Review of Systems     Objective:   Physical Exam  erythema and induration is noted in both  Decubital fossa as well as erythema to the left neck area. It does not appear linear.       Assessment & Plan:  Contact dermatitis - Plan: triamcinolone cream (KENALOG) 0.1 %  this definitely looks like contact dermatitis. I explained this to her. Explained that she might see more of these lesions breaking out. Recommend cool compresses, Benadryl at night and will give her triamcinolone.

## 2016-02-23 ENCOUNTER — Other Ambulatory Visit: Payer: Commercial Managed Care - HMO

## 2016-02-23 DIAGNOSIS — D500 Iron deficiency anemia secondary to blood loss (chronic): Secondary | ICD-10-CM

## 2016-02-23 LAB — CBC WITH DIFFERENTIAL/PLATELET
BASOS PCT: 0 %
Basophils Absolute: 0 cells/uL (ref 0–200)
EOS PCT: 5 %
Eosinophils Absolute: 355 cells/uL (ref 15–500)
HCT: 24.1 % — ABNORMAL LOW (ref 35.0–45.0)
Hemoglobin: 7.7 g/dL — ABNORMAL LOW (ref 11.7–15.5)
LYMPHS PCT: 12 %
Lymphs Abs: 852 cells/uL (ref 850–3900)
MCH: 27.9 pg (ref 27.0–33.0)
MCHC: 32 g/dL (ref 32.0–36.0)
MCV: 87.3 fL (ref 80.0–100.0)
MONO ABS: 852 {cells}/uL (ref 200–950)
MPV: 10.9 fL (ref 7.5–12.5)
Monocytes Relative: 12 %
NEUTROS PCT: 71 %
Neutro Abs: 5041 cells/uL (ref 1500–7800)
PLATELETS: 349 10*3/uL (ref 140–400)
RBC: 2.76 MIL/uL — AB (ref 3.80–5.10)
RDW: 17.1 % — AB (ref 11.0–15.0)
WBC: 7.1 10*3/uL (ref 4.0–10.5)

## 2016-02-23 LAB — IRON AND TIBC
%SAT: 6 % — ABNORMAL LOW (ref 11–50)
Iron: 18 ug/dL — ABNORMAL LOW (ref 45–160)
TIBC: 303 ug/dL (ref 250–450)
UIBC: 285 ug/dL (ref 125–400)

## 2016-02-23 LAB — IRON,TIBC AND FERRITIN PANEL
%SAT: 6 % — AB (ref 11–50)
IRON: 18 ug/dL — AB (ref 45–160)
TIBC: 303 ug/dL (ref 250–450)

## 2016-02-24 ENCOUNTER — Telehealth: Payer: Self-pay | Admitting: *Deleted

## 2016-02-24 ENCOUNTER — Other Ambulatory Visit: Payer: Self-pay | Admitting: *Deleted

## 2016-02-24 ENCOUNTER — Other Ambulatory Visit: Payer: Self-pay | Admitting: Hematology

## 2016-02-24 DIAGNOSIS — D500 Iron deficiency anemia secondary to blood loss (chronic): Secondary | ICD-10-CM

## 2016-02-24 LAB — FERRITIN: Ferritin: 17 ng/mL — ABNORMAL LOW (ref 20–288)

## 2016-02-24 NOTE — Telephone Encounter (Signed)
Per staff message and POF I have scheduled appts. Advised scheduler of appts. JMW  

## 2016-02-25 ENCOUNTER — Telehealth: Payer: Self-pay | Admitting: Hematology

## 2016-02-25 ENCOUNTER — Telehealth: Payer: Self-pay | Admitting: *Deleted

## 2016-02-25 NOTE — Telephone Encounter (Signed)
I have rescheduled appt to tomorrow. Scheduler to call the [patient

## 2016-02-25 NOTE — Telephone Encounter (Signed)
returned call and s.w. pt and confirmed all appts °

## 2016-02-25 NOTE — Telephone Encounter (Signed)
spoke w/ pt, pt refuses IV fera tx but has confirmed she will come in on 6/16 to discuss different options w/ MD

## 2016-02-26 ENCOUNTER — Ambulatory Visit: Payer: Commercial Managed Care - HMO

## 2016-03-03 ENCOUNTER — Ambulatory Visit: Payer: Commercial Managed Care - HMO

## 2016-03-04 ENCOUNTER — Ambulatory Visit: Payer: Commercial Managed Care - HMO

## 2016-03-05 ENCOUNTER — Ambulatory Visit (HOSPITAL_BASED_OUTPATIENT_CLINIC_OR_DEPARTMENT_OTHER): Payer: Commercial Managed Care - HMO | Admitting: Hematology

## 2016-03-05 ENCOUNTER — Other Ambulatory Visit (HOSPITAL_BASED_OUTPATIENT_CLINIC_OR_DEPARTMENT_OTHER): Payer: Commercial Managed Care - HMO

## 2016-03-05 ENCOUNTER — Ambulatory Visit (INDEPENDENT_AMBULATORY_CARE_PROVIDER_SITE_OTHER): Payer: Commercial Managed Care - HMO | Admitting: Family Medicine

## 2016-03-05 ENCOUNTER — Encounter: Payer: Self-pay | Admitting: Family Medicine

## 2016-03-05 ENCOUNTER — Telehealth: Payer: Self-pay | Admitting: Hematology

## 2016-03-05 ENCOUNTER — Encounter: Payer: Self-pay | Admitting: Hematology

## 2016-03-05 VITALS — BP 170/49 | HR 70 | Temp 98.4°F | Resp 18 | Ht 59.0 in | Wt 127.8 lb

## 2016-03-05 VITALS — BP 150/70 | HR 68 | Ht 59.0 in | Wt 128.0 lb

## 2016-03-05 DIAGNOSIS — K922 Gastrointestinal hemorrhage, unspecified: Secondary | ICD-10-CM

## 2016-03-05 DIAGNOSIS — D509 Iron deficiency anemia, unspecified: Secondary | ICD-10-CM | POA: Diagnosis not present

## 2016-03-05 DIAGNOSIS — E118 Type 2 diabetes mellitus with unspecified complications: Secondary | ICD-10-CM | POA: Diagnosis not present

## 2016-03-05 DIAGNOSIS — K5520 Angiodysplasia of colon without hemorrhage: Secondary | ICD-10-CM

## 2016-03-05 DIAGNOSIS — D500 Iron deficiency anemia secondary to blood loss (chronic): Secondary | ICD-10-CM

## 2016-03-05 DIAGNOSIS — E039 Hypothyroidism, unspecified: Secondary | ICD-10-CM

## 2016-03-05 LAB — COMPREHENSIVE METABOLIC PANEL
AST: 16 U/L (ref 5–34)
Albumin: 3.3 g/dL — ABNORMAL LOW (ref 3.5–5.0)
Alkaline Phosphatase: 62 U/L (ref 40–150)
Anion Gap: 7 mEq/L (ref 3–11)
BILIRUBIN TOTAL: 0.46 mg/dL (ref 0.20–1.20)
BUN: 14.7 mg/dL (ref 7.0–26.0)
CO2: 26 meq/L (ref 22–29)
CREATININE: 1.1 mg/dL (ref 0.6–1.1)
Calcium: 8.4 mg/dL (ref 8.4–10.4)
Chloride: 109 mEq/L (ref 98–109)
EGFR: 46 mL/min/{1.73_m2} — ABNORMAL LOW (ref >90)
GLUCOSE: 105 mg/dL (ref 70–140)
Potassium: 4.3 mEq/L (ref 3.5–5.1)
SODIUM: 142 meq/L (ref 136–145)
TOTAL PROTEIN: 6.2 g/dL — AB (ref 6.4–8.3)

## 2016-03-05 LAB — CBC & DIFF AND RETIC
BASO%: 0.4 % (ref 0.0–2.0)
BASOS ABS: 0 10*3/uL (ref 0.0–0.1)
EOS%: 6.7 % (ref 0.0–7.0)
Eosinophils Absolute: 0.3 10*3/uL (ref 0.0–0.5)
HEMATOCRIT: 25.2 % — AB (ref 34.8–46.6)
HGB: 7.7 g/dL — ABNORMAL LOW (ref 11.6–15.9)
Immature Retic Fract: 10.4 % — ABNORMAL HIGH (ref 1.60–10.00)
LYMPH#: 0.9 10*3/uL (ref 0.9–3.3)
LYMPH%: 18 % (ref 14.0–49.7)
MCH: 27.2 pg (ref 25.1–34.0)
MCHC: 30.6 g/dL — AB (ref 31.5–36.0)
MCV: 89 fL (ref 79.5–101.0)
MONO#: 0.4 10*3/uL (ref 0.1–0.9)
MONO%: 8.7 % (ref 0.0–14.0)
NEUT#: 3.3 10*3/uL (ref 1.5–6.5)
NEUT%: 66.2 % (ref 38.4–76.8)
PLATELETS: 303 10*3/uL (ref 145–400)
RBC: 2.83 10*6/uL — AB (ref 3.70–5.45)
RDW: 18.4 % — ABNORMAL HIGH (ref 11.2–14.5)
Retic %: 2.03 % (ref 0.70–2.10)
Retic Ct Abs: 57.45 10*3/uL (ref 33.70–90.70)
WBC: 4.9 10*3/uL (ref 3.9–10.3)

## 2016-03-05 LAB — IRON AND TIBC
%SAT: 4 % — AB (ref 21–57)
Iron: 14 ug/dL — ABNORMAL LOW (ref 41–142)
TIBC: 308 ug/dL (ref 236–444)
UIBC: 294 ug/dL (ref 120–384)

## 2016-03-05 LAB — FERRITIN: Ferritin: 19 ng/ml (ref 9–269)

## 2016-03-05 NOTE — Telephone Encounter (Signed)
Gave pt cal & avs °

## 2016-03-05 NOTE — Progress Notes (Signed)
   Subjective:    Patient ID: Connie [name], female    DOB: [birth year], 82 y.o.   MRN: [id number]  HPI She is here for recheck. She did have recent blood work which did show anemia as well as low iron as well as TIBC. She is scheduled to be seen by hematology today. She did get IV iron in the past but did difficulty with this causing chest tightness and shortness of breath. She is very leery of getting IV iron because of this. She does not complain of weakness, shortness of breath or fatigue.   Review of Systems     Objective:   Physical Exam Alert and in no distress otherwise not examined       Assessment & Plan:  Iron deficiency anemia secondary to blood loss (chronic)  Type 2 diabetes mellitus with complication, without long-term current use of insulin ([place]) I will wait on hematology report to determine how to handle her anemia. Otherwise she will follow-up with her diabetes and roughly 2 months.

## 2016-03-08 ENCOUNTER — Other Ambulatory Visit: Payer: Self-pay | Admitting: *Deleted

## 2016-03-08 ENCOUNTER — Telehealth: Payer: Self-pay | Admitting: *Deleted

## 2016-03-08 ENCOUNTER — Telehealth: Payer: Self-pay | Admitting: Family Medicine

## 2016-03-08 ENCOUNTER — Other Ambulatory Visit: Payer: Self-pay

## 2016-03-08 ENCOUNTER — Other Ambulatory Visit: Payer: Self-pay | Admitting: Hematology

## 2016-03-08 DIAGNOSIS — R609 Edema, unspecified: Secondary | ICD-10-CM

## 2016-03-08 MED ORDER — POTASSIUM CHLORIDE CRYS ER 10 MEQ PO TBCR: 10.0000 meq | EXTENDED_RELEASE_TABLET | Freq: Two times a day (BID) | ORAL | Status: DC

## 2016-03-08 NOTE — Telephone Encounter (Signed)
Pt called for refill of potassium chloride. Please send to [facility] mail order pharmacy.

## 2016-03-08 NOTE — Telephone Encounter (Signed)
Received call from pt asking about lab results from last week.  Informed pt that hemoglobin is 7.7.  Pt stated she has been feeling "very low" and wondered if she will be getting treated soon.  This RN received message from pharmacy stating injectafer could be ordered, message sent to Dr. [name] to confirm.  Informed patient that message had been sent to Dr. [name] (out of the office today), and that she should hear sometime this week about having that scheduled.  Pt verbalized understanding, and thankful for phone call.

## 2016-03-08 NOTE — Telephone Encounter (Signed)
Sent in med

## 2016-03-09 ENCOUNTER — Other Ambulatory Visit: Payer: Self-pay | Admitting: Hematology

## 2016-03-09 ENCOUNTER — Other Ambulatory Visit: Payer: Self-pay | Admitting: *Deleted

## 2016-03-09 DIAGNOSIS — D500 Iron deficiency anemia secondary to blood loss (chronic): Secondary | ICD-10-CM

## 2016-03-09 NOTE — Progress Notes (Signed)
Marland Kitchen    HEMATOLOGY/ONCOLOGY CLINIC NOTE  Date of Service: 03/05/2016  Patient Care Team: [name], MD as PCP - General (Family Medicine)  CHIEF COMPLAINTS/PURPOSE OF CONSULTATION:  Iron deficiency anemia due to GI bleeding from multiple AVMs  HISTORY OF PRESENTING ILLNESS:   Connie [name] is a wonderful 82 y.o. female who I had seen in the hospital on 3/3/2017when she presented with severe some dramatic anemia requiring blood transfusions.  Patient has a history of hypertension, hypothyroidism, diabetes and has had issues with recurrent gastrointestinal bleeds related to both upper and lower GI tract AVMs which have been ablated multiple times in the past.  She has also had issues with gastric erosions.  She reports that on 2 occasions of bleeding has been severe enough to require blood transfusions. Her hemoglobin was noted to be 5.4 with an MCV of 79.9 on her presentation.  She was noted to have been on aspirin for primary prophylaxis. Her fecal occult blood testing was noted to be negative.  Hemolytic markers were negative. Ferritin was noted to be 34 with a saturation of 18%. Patient received 4 units of PRBCs.  Her aspirin was held.she was given IV feraheme .   Patient returns for followup clinic today. Her hemoglobin on discharge was 10.6 with an MCV of 84.4.  Her hemoglobin is down to 8.8 today with an MCV of 86.6.RDW is increasing.  Ferritin level was noted to be 18. Informed consent was obtained to set her up for IV iron in the clinic. Patient noted no overt GI bleeding.  INTERVAL HISTORY  Ms Schwiesow was sent to re-establish care for her Iron deficiency anemia. She has been noted to have recurrent iron deficiency anemia due to chronic GI bleeding related to her AVMs. Hemoglobin is again down to 7.7 with a drop in her ferritin level to 18. She does not report any overt GI bleeding. She has been hesitant to receive additional IV Feraheme for iron replacement. After her  previous dose she did not have any acute reactions but fell short of breath at home and was admitted for exacerbation of her CHF. Unclear that this represents an allergic reaction. We discussed options for alternative IV iron preparations. After discussion is okay with the idea of using IV INjectafer with premedications to reduce the risk of allergic reactions. The alternative option is to monitor her hemoglobin with her primary care physician and set her up for transfusions as needed.   MEDICAL HISTORY:  Past Medical History  Diagnosis Date  . Hypertension   . Obesity   . Allergy     RHINITIS  . Hypothyroidism   . Hypercholesteremia   . Bleeding gastric ulcer     hx/notes 11/21/2015  . Angiodysplasia of stomach     hx/notes 11/21/2015  . Angiodysplasia of duodenum     hx/notes 11/21/2015  . History of blood transfusion 01/2015; 06/2015; 11/21/2015  . Type II diabetes mellitus ([place])   . Pneumonia "several times"  . Chronic blood loss anemia     secondary to angiodysplasia of the stomach and duodenum as well as history of bleeding gastric ulcer hx/notes 11/21/2015  . Arthritis     "joints ache"    SURGICAL HISTORY: Past Surgical History  Procedure Laterality Date  . Total thyroidectomy  ~ 1966  . Shoulder open rotator cuff repair Right 12/2004    open subacromial decompression, distal clavicle  resection, rotator cuff repair/notes 02/02/2011  . Colonoscopy N/A 08/30/2014    Procedure:  COLONOSCOPY;  Surgeon: Beryle Beams, MD;  Location: [facility];  Service: Endoscopy;  Laterality: N/A;  . Hot hemostasis N/A 08/30/2014    Procedure: HOT HEMOSTASIS (ARGON PLASMA COAGULATION/BICAP);  Surgeon: Beryle Beams, MD;  Location: [facility];  Service: Endoscopy;  Laterality: N/A;  . Colonoscopy N/A 02/21/2015    Procedure: COLONOSCOPY;  Surgeon: Carol Ada, MD;  Location: [facility];  Service: Endoscopy;  Laterality: N/A;  . Givens capsule study N/A 05/15/2015    Procedure: GIVENS CAPSULE  STUDY;  Surgeon: Carol Ada, MD;  Location: [place];  Service: Endoscopy;  Laterality: N/A;  . Enteroscopy N/A 06/06/2015    Procedure: ENTEROSCOPY;  Surgeon: Carol Ada, MD;  Location: [facility];  Service: Endoscopy;  Laterality: N/A;  . Hot hemostasis N/A 06/06/2015    Procedure: HOT HEMOSTASIS (ARGON PLASMA COAGULATION/BICAP);  Surgeon: Carol Ada, MD;  Location: [facility];  Service: Endoscopy;  Laterality: N/A;  . Tonsillectomy  1940s  . Joint replacement    . Total knee arthroplasty Bilateral ~ 2002  . Revision total knee arthroplasty Bilateral 2004-2015  . Fracture surgery    . Cardiac catheterization N/A 12/19/2015    Procedure: Left Heart Cath and Coronary Angiography;  Surgeon: Leonie Man, MD;  Location: [facility];  Service: Cardiovascular;  Laterality: N/A;    SOCIAL HISTORY: Social History   Social History  . Marital Status: Married    Spouse Name: N/A  . Number of Children: N/A  . Years of Education: N/A   Occupational History  . Not on file.   Social History Main Topics  . Smoking status: Never Smoker   . Smokeless tobacco: Never Used  . Alcohol Use: No  . Drug Use: No  . Sexual Activity: Not Currently   Other Topics Concern  . Not on file   Social History Narrative    FAMILY HISTORY: Family History  Problem Relation Age of Onset  . Anemia Sister   . Asthma Mother   . Ulcers Father     ALLERGIES:  has No Known Allergies.  MEDICATIONS:  Current Outpatient Prescriptions  Medication Sig Dispense Refill  . atorvastatin (LIPITOR) 40 MG tablet TAKE ONE TABLET BY MOUTH ONCE DAILY 90 tablet 3  . carvedilol (COREG) 6.25 MG tablet Take 1 tablet (6.25 mg total) by mouth 2 (two) times daily with a meal. (Patient not taking: Reported on 03/05/2016) 60 tablet 0  . furosemide (LASIX) 20 MG tablet Take 1 tablet (20 mg total) by mouth daily. 90 tablet 0  . levothyroxine (SYNTHROID, LEVOTHROID) 125 MCG tablet TAKE 1 TABLET EVERY DAY 90  tablet 3  . Multiple Vitamins-Minerals (PRESERVISION AREDS 2) CAPS Take by mouth.    . pantoprazole (PROTONIX) 20 MG tablet Take 1 tablet (20 mg total) by mouth daily. 90 tablet 0  . pioglitazone (ACTOS) 30 MG tablet Take 1 tablet (30 mg total) by mouth daily. 90 tablet 1  . potassium chloride (K-DUR,KLOR-CON) 10 MEQ tablet Take 1 tablet (10 mEq total) by mouth 2 (two) times daily. 180 tablet 1  . sacubitril-valsartan (ENTRESTO) 49-51 MG Take 1 tablet by mouth 2 (two) times daily. 60 tablet 3  . traZODone (DESYREL) 50 MG tablet Take 1/2 tablet by mouth at night as needed for sleep    . triamcinolone cream (KENALOG) 0.1 % Apply 1 application topically 2 (two) times daily. 30 g 0  . [DISCONTINUED] potassium chloride (K-DUR) 10 MEQ tablet TAKE ONE TABLET BY MOUTH TWICE DAILY 60 tablet 0  No current facility-administered medications for this visit.    REVIEW OF SYSTEMS:    10 Point review of Systems was done is negative except as noted above.  PHYSICAL EXAMINATION: ECOG PERFORMANCE STATUS: 1 - Symptomatic but completely ambulatory  . Filed Vitals:   03/05/16 1117  BP: 170/49  Pulse: 70  Temp: 98.4 F (36.9 C)  Resp: 18   Filed Weights   03/05/16 1117  Weight: 127 lb 12.8 oz (57.97 kg)   .Body mass index is 25.8 kg/(m^2).  GENERAL:alert, in no acute distress and comfortable SKIN: skin color, texture, turgor are normal, no rashes or significant lesions EYES: normal, conjunctiva are pink and non-injected, sclera clear OROPHARYNX:no exudate, no erythema and lips, buccal mucosa, and tongue normal  NECK: supple, no JVD, thyroid normal size, non-tender, without nodularity LYMPH:  no palpable lymphadenopathy in the cervical, axillary or inguinal LUNGS: clear to auscultation with normal respiratory effort HEART: regular rate & rhythm,  no murmurs and no lower extremity edema ABDOMEN: abdomen soft, non-tender, normoactive bowel sounds ,  no palpable hepatosplenomegaly Musculoskeletal:  no cyanosis of digits and no clubbing  PSYCH: alert & oriented x 3 with fluent speech NEURO: no focal motor/sensory deficits  LABORATORY DATA:   . CBC Latest Ref Rng 03/05/2016 02/23/2016 01/21/2016  WBC 3.9 - 10.3 10e3/uL 4.9 7.1 4.8  Hemoglobin 11.6 - 15.9 g/dL 7.7(L) 7.7(L) 9.6(L)  Hematocrit 34.8 - 46.6 % 25.2(L) 24.1(L) 31.6(L)  Platelets 145 - 400 10e3/uL 303 349 309    . CBC    Component Value Date/Time   WBC 4.9 03/05/2016 1306   WBC 7.1 02/23/2016 1027   RBC 2.83* 03/05/2016 1306   RBC 2.76* 02/23/2016 1027   RBC 3.23* 11/21/2015 1647   HGB 7.7* 03/05/2016 1306   HGB 7.7* 02/23/2016 1027   HCT 25.2* 03/05/2016 1306   HCT 24.1* 02/23/2016 1027   PLT 303 03/05/2016 1306   PLT 349 02/23/2016 1027   MCV 89.0 03/05/2016 1306   MCV 87.3 02/23/2016 1027   MCH 27.2 03/05/2016 1306   MCH 27.9 02/23/2016 1027   MCHC 30.6* 03/05/2016 1306   MCHC 32.0 02/23/2016 1027   RDW 18.4* 03/05/2016 1306   RDW 17.1* 02/23/2016 1027   LYMPHSABS 0.9 03/05/2016 1306   LYMPHSABS 852 02/23/2016 1027   MONOABS 0.4 03/05/2016 1306   MONOABS 852 02/23/2016 1027   EOSABS 0.3 03/05/2016 1306   EOSABS 355 02/23/2016 1027   BASOSABS 0.0 03/05/2016 1306   BASOSABS 0 02/23/2016 1027    . CMP Latest Ref Rng 03/05/2016 01/06/2016 12/29/2015  Glucose 70 - 140 mg/dl 105 146(H) 106(H)  BUN 7.0 - 26.0 mg/dL 14.7 30(H) 25  Creatinine 0.6 - 1.1 mg/dL 1.1 1.12(H) 1.20(H)  Sodium 136 - 145 mEq/L 142 143 143  Potassium 3.5 - 5.1 mEq/L 4.3 4.3 4.9  Chloride 98 - 110 mmol/L - 109 110  CO2 22 - 29 mEq/L 26 25 25   Calcium 8.4 - 10.4 mg/dL 8.4 8.4(L) 8.8  Total Protein 6.4 - 8.3 g/dL 6.2(L) - -  Total Bilirubin 0.20 - 1.20 mg/dL 0.46 - -  Alkaline Phos 40 - 150 U/L 62 - -  AST 5 - 34 U/L 16 - -  ALT 0 - 55 U/L <9 - -   . Lab Results  Component Value Date   IRON 14* 03/05/2016   TIBC 308 03/05/2016   IRONPCTSAT 4* 03/05/2016   (Iron and TIBC)  Lab Results  Component Value Date   FERRITIN 19  03/05/2016     ASSESSMENT & PLAN:   82 year old Caucasian female with  #1 Microcytic anemia likely related to iron deficiency from chronic GI bleeding. No known disorder causing chronic inflammation at this time. Ferritin down trending from 34 to 19. Recent hemoytic markers ruled out a hemolytic process.  Recently admitted with symptomatic anemia in early March 2017 and required 4 units of PRBCs  #2 history of recurrent GI bleeding related to upper and lower GI tract AVMs and gastric erosions.  #3 status post acute renal failure in January 2017 currently creatinine within normal limits-  #4 hypothyroidism on levothyroxine .  #5 ?? Reaction to [facility] - current hospital admission that shortness of breath appears to be more likely related to CHF decompensation than allergic reaction to Feraheme. However patient is very anxious and hesitant to get this preparation.  Plan  -patient is currently off ASA to try to reduce her risk of GI bleeding though she continues to remain at high-risk for GI bleeding given her extensive AVMs. -The central treatment is GI follow-up to ablate AVMs and 2 was possible to control her GI bleeding. -If it is not possible to control her GI bleeding she will likely develop chronic blood loss anemia which could be treated with transfusions as needed or progressive iron replacement IV with additional transfusions as needed. -Patient is hesitant to try IV Feraheme. -We shall try to get IV Injectafer - though it to special ordered and not in stock semi-take a 1 to 2 weeks to get this. -We'll target a ferritin level of more than 100 if possible. -Patient was counseled call us if she got for symptomatic and we would set her up for transfusion. -Discuss risks and benefits of IV iron and informed consent obtained from the patient. -Patient  will be set up for IV  Injectafer 750mg  qweekly x 2 doses with premedications. -daily multivitamin orally. -Continue followup  with GI for further evaluation and management of AVMs as needed. -Continue close followup with primary care physician for management of other medical issues.   Return to care with Dr. [name] in 1 month with repeat CBC, CMP and ferritin to reassess status of anemia.  All of the patients questions were answered with apparent satisfaction. The patient knows to call the clinic with any problems, questions or concerns.  I spent 25 minutes counseling the patient face to face. The total time spent in the appointment was 25 minutes and more than 50% was on counseling and direct patient cares.    Sullivan Lone MD [place] AAHIVMS [facility] Hematology/Oncology Physician [facility]  (Office):       [phone] (Work cell):  [phone] (Fax):           [phone]

## 2016-03-09 NOTE — Progress Notes (Unsigned)
Marland Kitchen    HEMATOLOGY/ONCOLOGY CLINIC NOTE  Date of Service: 03/09/2016   Patient Care Team: [name], MD as PCP - General (Family Medicine)  CHIEF COMPLAINTS/PURPOSE OF CONSULTATION:  Iron deficiency anemia due to GI bleeding from multiple AVMs  HISTORY OF PRESENTING ILLNESS:   Connie [name] is a wonderful 82 y.o. female who I had seen in the hospital on 3/3/2017when she presented with severe some dramatic anemia requiring blood transfusions.  Patient has a history of hypertension, hypothyroidism, diabetes and has had issues with recurrent gastrointestinal bleeds related to both upper and lower GI tract AVMs which have been ablated multiple times in the past.  She has also had issues with gastric erosions.  She reports that on 2 occasions of bleeding has been severe enough to require blood transfusions. Her hemoglobin was noted to be 5.4 with an MCV of 79.9 on her presentation.  She was noted to have been on aspirin for primary prophylaxis. Her fecal occult blood testing was noted to be negative.  Hemolytic markers were negative. Ferritin was noted to be 34 with a saturation of 18%. Patient received 4 units of PRBCs.  Her aspirin was held.she was given IV feraheme .   Patient returns for followup clinic today. Her hemoglobin on discharge was 10.6 with an MCV of 84.4.  Her hemoglobin is down to 8.8 today with an MCV of 86.6.RDW is increasing.  Ferritin level was noted to be 18. Informed consent was obtained to set her up for IV iron in the clinic. Patient noted no overt GI bleeding.   INTERVAL HISTORY  Ms Klug is here for follow-up of her chronic blood loss related iron deficiency anemia after being referred by her primary care physician. She had received IV Feraheme previously but then was subsequently feeling short of breath and was admitted with fluid overload without a clear picture of allergic reaction to the IV iron. Patient has been very hesitant to receive any  further IV iron since then. She continues to become progressively anemic and your hemoglobin is now down to 7.7. We discussed treatment options. She is hesitant to receive the IV Feraheme and we discussed the option of possibly considering injectafer with premedications when that comes available.  MEDICAL HISTORY:  Past Medical History  Diagnosis Date  . Hypertension   . Obesity   . Allergy     RHINITIS  . Hypothyroidism   . Hypercholesteremia   . Bleeding gastric ulcer     hx/notes 11/21/2015  . Angiodysplasia of stomach     hx/notes 11/21/2015  . Angiodysplasia of duodenum     hx/notes 11/21/2015  . History of blood transfusion 01/2015; 06/2015; 11/21/2015  . Type II diabetes mellitus ([place])   . Pneumonia "several times"  . Chronic blood loss anemia     secondary to angiodysplasia of the stomach and duodenum as well as history of bleeding gastric ulcer hx/notes 11/21/2015  . Arthritis     "joints ache"    SURGICAL HISTORY: Past Surgical History  Procedure Laterality Date  . Total thyroidectomy  ~ 1966  . Shoulder open rotator cuff repair Right 12/2004    open subacromial decompression, distal clavicle  resection, rotator cuff repair/notes 02/02/2011  . Colonoscopy N/A 08/30/2014    Procedure: COLONOSCOPY;  Surgeon: Beryle Beams, MD;  Location: [facility] ENDOSCOPY;  Service: Endoscopy;  Laterality: N/A;  . Hot hemostasis N/A 08/30/2014    Procedure: HOT HEMOSTASIS (ARGON PLASMA COAGULATION/BICAP);  Surgeon: Beryle Beams, MD;  Location: [facility] ENDOSCOPY;  Service: Endoscopy;  Laterality: N/A;  . Colonoscopy N/A 02/21/2015    Procedure: COLONOSCOPY;  Surgeon: Carol Ada, MD;  Location: [facility] ENDOSCOPY;  Service: Endoscopy;  Laterality: N/A;  . Givens capsule study N/A 05/15/2015    Procedure: GIVENS CAPSULE STUDY;  Surgeon: Carol Ada, MD;  Location: [place];  Service: Endoscopy;  Laterality: N/A;  . Enteroscopy N/A 06/06/2015    Procedure: ENTEROSCOPY;  Surgeon: Carol Ada, MD;  [facility]  ENDOSCOPY;  Service: Endoscopy;  Laterality: N/A;  . Hot hemostasis N/A 06/06/2015    Procedure: HOT HEMOSTASIS (ARGON PLASMA COAGULATION/BICAP);  Surgeon: Carol Ada, MD;  Location: [facility];  Service: Endoscopy;  Laterality: N/A;  . Tonsillectomy  1940s  . Joint replacement    . Total knee arthroplasty Bilateral ~ 2002  . Revision total knee arthroplasty Bilateral 2004-2015  . Fracture surgery    . Cardiac catheterization N/A 12/19/2015    Procedure: Left Heart Cath and Coronary Angiography;  Surgeon: Leonie Man, MD;  Location: [place] CV LAB;  Service: Cardiovascular;  Laterality: N/A;    SOCIAL HISTORY: Social History   Social History  . Marital Status: Married    Spouse Name: N/A  . Number of Children: N/A  . Years of Education: N/A   Occupational History  . Not on file.   Social History Main Topics  . Smoking status: Never Smoker   . Smokeless tobacco: Never Used  . Alcohol Use: No  . Drug Use: No  . Sexual Activity: Not Currently   Other Topics Concern  . Not on file   Social History Narrative    FAMILY HISTORY: Family History  Problem Relation Age of Onset  . Anemia Sister   . Asthma Mother   . Ulcers Father     ALLERGIES:  has No Known Allergies.  MEDICATIONS:  Current Outpatient Prescriptions  Medication Sig Dispense Refill  . atorvastatin (LIPITOR) 40 MG tablet TAKE ONE TABLET BY MOUTH ONCE DAILY 90 tablet 3  . carvedilol (COREG) 6.25 MG tablet Take 1 tablet (6.25 mg total) by mouth 2 (two) times daily with a meal. (Patient not taking: Reported on 03/05/2016) 60 tablet 0  . furosemide (LASIX) 20 MG tablet Take 1 tablet (20 mg total) by mouth daily. 90 tablet 0  . levothyroxine (SYNTHROID, LEVOTHROID) 125 MCG tablet TAKE 1 TABLET EVERY DAY 90 tablet 3  . Multiple Vitamins-Minerals (PRESERVISION AREDS 2) CAPS Take by mouth.    . pantoprazole (PROTONIX) 20 MG tablet Take 1 tablet (20 mg total) by mouth daily. 90 tablet 0  . pioglitazone  (ACTOS) 30 MG tablet Take 1 tablet (30 mg total) by mouth daily. 90 tablet 1  . potassium chloride (K-DUR,KLOR-CON) 10 MEQ tablet Take 1 tablet (10 mEq total) by mouth 2 (two) times daily. 180 tablet 1  . sacubitril-valsartan (ENTRESTO) 49-51 MG Take 1 tablet by mouth 2 (two) times daily. 60 tablet 3  . traZODone (DESYREL) 50 MG tablet Take 1/2 tablet by mouth at night as needed for sleep    . triamcinolone cream (KENALOG) 0.1 % Apply 1 application topically 2 (two) times daily. 30 g 0  . [DISCONTINUED] potassium chloride (K-DUR) 10 MEQ tablet TAKE ONE TABLET BY MOUTH TWICE DAILY 60 tablet 0   No current facility-administered medications for this visit.    REVIEW OF SYSTEMS:    10 Point review of Systems was done is negative except as noted above.  PHYSICAL EXAMINATION: ECOG PERFORMANCE STATUS: 1 - Symptomatic  but completely ambulatory  . There were no vitals filed for this visit. There were no vitals filed for this visit. .There is no weight on file to calculate BMI.  GENERAL:alert, in no acute distress and comfortable SKIN: skin color, texture, turgor are normal, no rashes or significant lesions EYES: normal, conjunctiva are pink and non-injected, sclera clear OROPHARYNX:no exudate, no erythema and lips, buccal mucosa, and tongue normal  NECK: supple, no JVD, thyroid normal size, non-tender, without nodularity LYMPH:  no palpable lymphadenopathy in the cervical, axillary or inguinal LUNGS: clear to auscultation with normal respiratory effort HEART: regular rate & rhythm,  no murmurs and no lower extremity edema ABDOMEN: abdomen soft, non-tender, normoactive bowel sounds  Musculoskeletal: no cyanosis of digits and no clubbing  PSYCH: alert & oriented x 3 with fluent speech NEURO: no focal motor/sensory deficits  LABORATORY DATA:  . CBC Latest Ref Rng 03/05/2016 02/23/2016 01/21/2016  WBC 3.9 - 10.3 10e3/uL 4.9 7.1 4.8  Hemoglobin 11.6 - 15.9 g/dL 7.7(L) 7.7(L) 9.6(L)  Hematocrit  34.8 - 46.6 % 25.2(L) 24.1(L) 31.6(L)  Platelets 145 - 400 10e3/uL 303 349 309   CBC    Component Value Date/Time   WBC 4.9 03/05/2016 1306   WBC 7.1 02/23/2016 1027   RBC 2.83* 03/05/2016 1306   RBC 2.76* 02/23/2016 1027   RBC 3.23* 11/21/2015 1647   HGB 7.7* 03/05/2016 1306   HGB 7.7* 02/23/2016 1027   HCT 25.2* 03/05/2016 1306   HCT 24.1* 02/23/2016 1027   PLT 303 03/05/2016 1306   PLT 349 02/23/2016 1027   MCV 89.0 03/05/2016 1306   MCV 87.3 02/23/2016 1027   MCH 27.2 03/05/2016 1306   MCH 27.9 02/23/2016 1027   MCHC 30.6* 03/05/2016 1306   MCHC 32.0 02/23/2016 1027   RDW 18.4* 03/05/2016 1306   RDW 17.1* 02/23/2016 1027   LYMPHSABS 0.9 03/05/2016 1306   LYMPHSABS 852 02/23/2016 1027   MONOABS 0.4 03/05/2016 1306   MONOABS 852 02/23/2016 1027   EOSABS 0.3 03/05/2016 1306   EOSABS 355 02/23/2016 1027   BASOSABS 0.0 03/05/2016 1306   BASOSABS 0 02/23/2016 1027     CMP Latest Ref Rng 03/05/2016 01/06/2016 12/29/2015  Glucose 70 - 140 mg/dl 105 146(H) 106(H)  BUN 7.0 - 26.0 mg/dL 14.7 30(H) 25  Creatinine 0.6 - 1.1 mg/dL 1.1 1.12(H) 1.20(H)  Sodium 136 - 145 mEq/L 142 143 143  Potassium 3.5 - 5.1 mEq/L 4.3 4.3 4.9  Chloride 98 - 110 mmol/L - 109 110  CO2 22 - 29 mEq/L 26 25 25   Calcium 8.4 - 10.4 mg/dL 8.4 8.4(L) 8.8  Total Protein 6.4 - 8.3 g/dL 6.2(L) - -  Total Bilirubin 0.20 - 1.20 mg/dL 0.46 - -  Alkaline Phos 40 - 150 U/L 62 - -  AST 5 - 34 U/L 16 - -  ALT 0 - 55 U/L <9 - -   . Lab Results  Component Value Date   IRON 14* 03/05/2016   TIBC 308 03/05/2016   IRONPCTSAT 4* 03/05/2016   (Iron and TIBC)  Lab Results  Component Value Date   FERRITIN 19 03/05/2016      ASSESSMENT & PLAN:   82 year old Caucasian female with  #1 Microcytic anemia likely related to iron deficiency from chronic GI bleeding.  Ferritin currently down to 19. Recent hemoytic markers ruled out a hemolytic process.  Recently admitted with symptomatic anemia in early march  2017 and required 4 units of PRBCs Her hemoglobin is trending back down  to 7.7. She had received 1 dose of IV Feraheme but then subsequently been admitted with volume overload. Unclear if she really had reaction to the IV iron.  #2 history of recurrent GI bleeding related to upper and lower GI tract AVMs and gastric erosions.  #3 status post acute renal failure in January 2017 currently creatinine within normal limits-  #4 hypothyroidism on levothyroxine .  Plan  -patient is currently off ASA to try to reduce her risk of GI bleeding though she continues to remain at high-risk for GI bleeding given her extensive AVMs. -All patient hesitant to receive any more IV Feraheme because she feels that that might have caused her previous hospital admission though it appears that her on admission was related to decompensation of her CHF. She has systolic CHF. -We shall try to obtain Injectafer instead -- pharmacy tells me that might take some to 10 days and we will schedule her accordingly. -We'll plan to do IV Injectafer 750mg  qweekly x 2 doses with premedications . -We'll target a ferritin level of more than 100 if possible. -Patient counseled to call us if her anemia gets more symptomatic since that might be an indication for blood transfusion . -Discuss risks and benefits of IV iron and informed consent obtained from the patient. -daily multivitamin orally. -Continue followup with GI for further evaluation and management of AVMs as needed. -Continue close followup with primary care physician for management of other medical issues.   Return to care with Dr. [name] in 1 months with repeat CBC, CMP and ferritin to reassess status of anemia.  All of the patients questions were answered with apparent satisfaction. The patient knows to call the clinic with any problems, questions or concerns.  I spent 20 minutes counseling the patient face to face. The total time spent in the appointment was 25 minutes  and more than 50% was on counseling and direct patient cares.    Sullivan Lone MD [place] AAHIVMS [facility] Hematology/Oncology Physician [facility]  (Office):       [phone] (Work cell):  [phone] (Fax):           [phone]

## 2016-03-10 ENCOUNTER — Ambulatory Visit: Payer: Commercial Managed Care - HMO

## 2016-03-10 DIAGNOSIS — D500 Iron deficiency anemia secondary to blood loss (chronic): Secondary | ICD-10-CM | POA: Diagnosis not present

## 2016-03-11 ENCOUNTER — Encounter: Payer: Self-pay | Admitting: *Deleted

## 2016-03-11 ENCOUNTER — Ambulatory Visit: Payer: Commercial Managed Care - HMO

## 2016-03-11 ENCOUNTER — Other Ambulatory Visit: Payer: Self-pay | Admitting: *Deleted

## 2016-03-11 NOTE — Progress Notes (Signed)
This RN attempted to call patient with update for apt to receive injectafer.  This RN spoke with Arbie Cookey, RPh yesterday and Becky Sax, RPh today about status of building injectafer "smartset".  Per pharmacy, medication has arrived, but smartset has not been completed by IT.  Per pharmacy, hopeful to have smartset built by early next week.  Will continue to attempt to reach patient to notify of status.

## 2016-03-12 ENCOUNTER — Encounter: Payer: Self-pay | Admitting: *Deleted

## 2016-03-12 NOTE — Progress Notes (Signed)
Spoke to patient this morning. Informed patient that she should be receiving a call by next week to schedule her for iron due to a couple of updates being done on our end. Patient verbalized understanding.

## 2016-03-15 ENCOUNTER — Other Ambulatory Visit: Payer: Self-pay | Admitting: Family Medicine

## 2016-03-16 ENCOUNTER — Telehealth: Payer: Self-pay | Admitting: *Deleted

## 2016-03-16 NOTE — Progress Notes (Signed)
Patient called asking if Iron has been scheduled yet.

## 2016-03-16 NOTE — Telephone Encounter (Signed)
"  I would like to know what's been scheduled for Feraheme.  I am not available Thursday 03-18-2016 at 2:30 because I'm scheduled for an injection to my right eye.  Also not available Friday 03-19-2016 at 9:30 am because I see my cardiologist.  These appointments were scheduled over a month ago.  Any other dates or times I'm available."  Return number [phone].

## 2016-03-17 NOTE — Telephone Encounter (Signed)
RN contacted pharmacy for status of medication. Informed that pharmacy does have medication and adam will be contacted to find out the status of the smartset being place. Awaiting response.

## 2016-03-18 ENCOUNTER — Other Ambulatory Visit: Payer: Self-pay | Admitting: Hematology

## 2016-03-18 ENCOUNTER — Other Ambulatory Visit: Payer: Self-pay | Admitting: *Deleted

## 2016-03-18 ENCOUNTER — Telehealth: Payer: Self-pay | Admitting: *Deleted

## 2016-03-18 ENCOUNTER — Telehealth: Payer: Self-pay | Admitting: Hematology

## 2016-03-18 DIAGNOSIS — H353211 Exudative age-related macular degeneration, right eye, with active choroidal neovascularization: Secondary | ICD-10-CM | POA: Diagnosis not present

## 2016-03-18 DIAGNOSIS — D500 Iron deficiency anemia secondary to blood loss (chronic): Secondary | ICD-10-CM

## 2016-03-18 NOTE — Telephone Encounter (Signed)
"  Calling to find out the status of IV Iron treatments.  My Hgb = 7.7 on 03-05-2016.  I'm not feeling my best and need to know what to do." Dr. [name] notified.  Verbal order received and read back from [facility] for lab today or tomorrow, give her choice of feraheme with pre-medicines next week or to wait for Pharmacy's new template/smart set orders for reducing volume over load iron for her case.  Order given to Ms. Lamora at this time.  "I will come in tomorrow morning at 8:00 for lab because I have a 9:30 appointment with cardiologist.  I do not want to receive Feraheme because I'm afraid after not being able to breathe the next day after the last one."

## 2016-03-18 NOTE — Telephone Encounter (Signed)
per 2nd pof pt selected 8:00 a, appt 6/30-pt aware of appt-had another appt to be @ 9:30

## 2016-03-19 ENCOUNTER — Encounter: Payer: Self-pay | Admitting: Cardiology

## 2016-03-19 ENCOUNTER — Telehealth: Payer: Self-pay | Admitting: *Deleted

## 2016-03-19 ENCOUNTER — Other Ambulatory Visit (HOSPITAL_BASED_OUTPATIENT_CLINIC_OR_DEPARTMENT_OTHER): Payer: Commercial Managed Care - HMO

## 2016-03-19 ENCOUNTER — Ambulatory Visit (INDEPENDENT_AMBULATORY_CARE_PROVIDER_SITE_OTHER): Payer: Commercial Managed Care - HMO | Admitting: Cardiology

## 2016-03-19 VITALS — BP 142/72 | HR 74 | Ht <= 58 in | Wt 123.4 lb

## 2016-03-19 DIAGNOSIS — R778 Other specified abnormalities of plasma proteins: Secondary | ICD-10-CM

## 2016-03-19 DIAGNOSIS — D500 Iron deficiency anemia secondary to blood loss (chronic): Secondary | ICD-10-CM | POA: Diagnosis not present

## 2016-03-19 DIAGNOSIS — I5021 Acute systolic (congestive) heart failure: Secondary | ICD-10-CM | POA: Diagnosis not present

## 2016-03-19 DIAGNOSIS — I5022 Chronic systolic (congestive) heart failure: Secondary | ICD-10-CM | POA: Diagnosis not present

## 2016-03-19 DIAGNOSIS — I428 Other cardiomyopathies: Secondary | ICD-10-CM

## 2016-03-19 DIAGNOSIS — I429 Cardiomyopathy, unspecified: Secondary | ICD-10-CM

## 2016-03-19 DIAGNOSIS — R7989 Other specified abnormal findings of blood chemistry: Secondary | ICD-10-CM | POA: Diagnosis not present

## 2016-03-19 LAB — BASIC METABOLIC PANEL
ANION GAP: 11 meq/L (ref 3–11)
BUN: 28.2 mg/dL — AB (ref 7.0–26.0)
CALCIUM: 8.6 mg/dL (ref 8.4–10.4)
CO2: 24 mEq/L (ref 22–29)
Chloride: 109 mEq/L (ref 98–109)
Creatinine: 1.1 mg/dL (ref 0.6–1.1)
EGFR: 45 mL/min/{1.73_m2} — AB (ref >90)
GLUCOSE: 116 mg/dL (ref 70–140)
Potassium: 3.7 mEq/L (ref 3.5–5.1)
Sodium: 144 mEq/L (ref 136–145)

## 2016-03-19 LAB — CBC & DIFF AND RETIC
BASO%: 0.7 % (ref 0.0–2.0)
BASOS ABS: 0 10*3/uL (ref 0.0–0.1)
EOS%: 6.9 % (ref 0.0–7.0)
Eosinophils Absolute: 0.3 10*3/uL (ref 0.0–0.5)
HEMATOCRIT: 26.4 % — AB (ref 34.8–46.6)
HGB: 8 g/dL — ABNORMAL LOW (ref 11.6–15.9)
Immature Retic Fract: 12.4 % — ABNORMAL HIGH (ref 1.60–10.00)
LYMPH#: 0.9 10*3/uL (ref 0.9–3.3)
LYMPH%: 20.5 % (ref 14.0–49.7)
MCH: 26.9 pg (ref 25.1–34.0)
MCHC: 30.3 g/dL — AB (ref 31.5–36.0)
MCV: 88.9 fL (ref 79.5–101.0)
MONO#: 0.6 10*3/uL (ref 0.1–0.9)
MONO%: 14.3 % — AB (ref 0.0–14.0)
NEUT%: 57.6 % (ref 38.4–76.8)
NEUTROS ABS: 2.4 10*3/uL (ref 1.5–6.5)
Platelets: 296 10*3/uL (ref 145–400)
RBC: 2.97 10*6/uL — AB (ref 3.70–5.45)
RDW: 17.6 % — AB (ref 11.2–14.5)
RETIC %: 1.52 % (ref 0.70–2.10)
RETIC CT ABS: 45.14 10*3/uL (ref 33.70–90.70)
WBC: 4.2 10*3/uL (ref 3.9–10.3)

## 2016-03-19 LAB — FERRITIN: FERRITIN: 18 ng/mL (ref 9–269)

## 2016-03-19 MED ORDER — SACUBITRIL-VALSARTAN 49-51 MG PO TABS: 1.0000 | ORAL_TABLET | Freq: Two times a day (BID) | ORAL | Status: DC

## 2016-03-19 MED ORDER — CARVEDILOL 6.25 MG PO TABS: 6.2500 mg | ORAL_TABLET | Freq: Two times a day (BID) | ORAL | Status: DC

## 2016-03-19 NOTE — Progress Notes (Signed)
Cardiology Office Note    Date:  03/19/2016   ID:  Connie [name], DOB [birth year], MRN [id number]  PCP:  Wyatt Haste, MD  Cardiologist:   Candee Furbish, MD     History of Present Illness:  Connie [name] is a 82 y.o. female with nonischemic cardiomyopathy admitted in March 2017 with acute heart failure exacerbation. Had AVMs, chronic anemia. She had transient BiPAP use. Her ejection fraction on 12/17/15 was 35-40% with mild mitral regurgitation.  Unable to take aspirin and Plavix because of gastric AVMs. Thankfully, diagnostic cardiac catheterization performed in March 2017 showed no coronary disease. Troponin was minimally elevated secondary to heart failure at 0.12.  Cancer center checks blood work. IV iron. Afraid of it.   Left LE edema late in the day. Fairly chronic.  She and her husband have been married for 60 years.  Has been doing fairly well, no shortness of breath, no chest pain, no orthopnea. Maintaining body weight.   Past Medical History  Diagnosis Date  . Hypertension   . Obesity   . Allergy     RHINITIS  . Hypothyroidism   . Hypercholesteremia   . Bleeding gastric ulcer     hx/notes 11/21/2015  . Angiodysplasia of stomach     hx/notes 11/21/2015  . Angiodysplasia of duodenum     hx/notes 11/21/2015  . History of blood transfusion 01/2015; 06/2015; 11/21/2015  . Type II diabetes mellitus ([place])   . Pneumonia "several times"  . Chronic blood loss anemia     secondary to angiodysplasia of the stomach and duodenum as well as history of bleeding gastric ulcer hx/notes 11/21/2015  . Arthritis     "joints ache"    Past Surgical History  Procedure Laterality Date  . Total thyroidectomy  ~ 1966  . Shoulder open rotator cuff repair Right 12/2004    open subacromial decompression, distal clavicle  resection, rotator cuff repair/notes 02/02/2011  . Colonoscopy N/A 08/30/2014    Procedure: COLONOSCOPY;  Surgeon: Beryle Beams, MD;  Location: [facility]  ENDOSCOPY;  Service: Endoscopy;  Laterality: N/A;  . Hot hemostasis N/A 08/30/2014    Procedure: HOT HEMOSTASIS (ARGON PLASMA COAGULATION/BICAP);  Surgeon: Beryle Beams, MD;  Location: [facility];  Service: Endoscopy;  Laterality: N/A;  . Colonoscopy N/A 02/21/2015    Procedure: COLONOSCOPY;  Surgeon: Carol Ada, MD;  Location: [facility] ENDOSCOPY;  Service: Endoscopy;  Laterality: N/A;  . Givens capsule study N/A 05/15/2015    Procedure: GIVENS CAPSULE STUDY;  Surgeon: Carol Ada, MD;  Location: [place];  Service: Endoscopy;  Laterality: N/A;  . Enteroscopy N/A 06/06/2015    Procedure: ENTEROSCOPY;  Surgeon: Carol Ada, MD;  Location: [facility]OS[facility];  Service: Endoscopy;  Laterality: N/A;  . Hot hemostasis N/A 06/06/2015    Procedure: HOT HEMOSTASIS (ARGON PLASMA COAGULATION/BICAP);  Surgeon: Carol Ada, MD;  Location: [facility]OSCOPY;  Service: Endoscopy;  Laterality: N/A;  . Tonsillectomy  1940s  . Joint replacement    . Total knee arthroplasty Bilateral ~ 2002  . Revision total knee arthroplasty Bilateral 2004-2015  . Fracture surgery    . Cardiac catheterization N/A 12/19/2015    Procedure: Left Heart Cath and Coronary Angiography;  Surgeon: Leonie Man, MD;  Location: [place] [facility] LAB;  Service: Cardiovascular;  Laterality: N/A;    Current Medications: Outpatient Prescriptions Prior to Visit  Medication Sig Dispense Refill  . atorvastatin (LIPITOR) 40 MG tablet TAKE ONE TABLET BY MOUTH ONCE DAILY 90 tablet 3  . furosemide (  LASIX) 20 MG tablet Take 1 tablet (20 mg total) by mouth daily. 90 tablet 0  . levothyroxine (SYNTHROID, LEVOTHROID) 125 MCG tablet TAKE 1 TABLET EVERY DAY 90 tablet 3  . Multiple Vitamins-Minerals (PRESERVISION AREDS 2) CAPS Take by mouth.    . pantoprazole (PROTONIX) 20 MG tablet Take 1 tablet (20 mg total) by mouth daily. 90 tablet 0  . pioglitazone (ACTOS) 30 MG tablet TAKE 1 TABLET EVERY DAY 90 tablet 1  . potassium chloride (K-DUR,KLOR-CON) 10 MEQ  tablet Take 1 tablet (10 mEq total) by mouth 2 (two) times daily. 180 tablet 1  . traZODone (DESYREL) 50 MG tablet Take 1/2 tablet by mouth at night as needed for sleep    . triamcinolone cream (KENALOG) 0.1 % Apply 1 application topically 2 (two) times daily. 30 g 0  . sacubitril-valsartan (ENTRESTO) 49-51 MG Take 1 tablet by mouth 2 (two) times daily. 60 tablet 3  . carvedilol (COREG) 6.25 MG tablet Take 1 tablet (6.25 mg total) by mouth 2 (two) times daily with a meal. (Patient not taking: Reported on 03/05/2016) 60 tablet 0   No facility-administered medications prior to visit.     Allergies:   Review of patient's allergies indicates no known allergies.   Social History   Social History  . Marital Status: Married    Spouse Name: N/A  . Number of Children: N/A  . Years of Education: N/A   Social History Main Topics  . Smoking status: Never Smoker   . Smokeless tobacco: Never Used  . Alcohol Use: No  . Drug Use: No  . Sexual Activity: Not Currently   Other Topics Concern  . Not on file   Social History Narrative     Family History:  The patient's family history includes Anemia in her sister; Asthma in her mother; Ulcers in her father.   ROS:   Please see the history of present illness.    ROS All other systems reviewed and are negative.   PHYSICAL EXAM:   VS:  BP 142/72 mmHg  Pulse 74  Ht 4\' 10"  (1.473 m)  Wt 123 lb 6.4 oz (55.974 kg)  BMI 25.80 kg/m2  SpO2 97%   GEN: Well nourished, well developed, in no acute distress HEENT: normal Neck: no JVD, carotid bruits, or masses Cardiac: RRR; no murmurs, rubs, or gallops,no edema  Respiratory:  clear to auscultation bilaterally, normal work of breathing GI: soft, nontender, nondistended, + BS MS: no deformity or atrophy Skin: warm and dry, no rash Neuro:  Alert and Oriented x 3, Strength and sensation are intact Psych: euthymic mood, full affect  Wt Readings from Last 3 Encounters:  03/19/16 123 lb 6.4 oz (55.974  kg)  03/05/16 127 lb 12.8 oz (57.97 kg)  03/05/16 128 lb (58.06 kg)      Studies/Labs Reviewed:   EKG:  EKG is not ordered today.    Recent Labs: 12/17/2015: TSH 7.264* 12/20/2015: B Natriuretic Peptide 303.0*; Magnesium 2.0 03/05/2016: ALT <9 03/19/2016: BUN 28.2*; Creatinine 1.1; HGB 8.0*; Platelets 296; Potassium 3.7; Sodium 144   Lipid Panel    Component Value Date/Time   CHOL 162 07/25/2014 1019   TRIG 152* 07/25/2014 1019   HDL 52 07/25/2014 1019   CHOLHDL 3.1 07/25/2014 1019   VLDL 30 07/25/2014 1019   LDLCALC 80 07/25/2014 1019    Additional studies/ records that were reviewed today include:  Office notes, hospital records, lab work reviewed.    ASSESSMENT:    1. Chronic  systolic heart failure ([place])   2. Acute systolic congestive heart failure ([place])   3. Iron deficiency anemia due to chronic blood loss   4. Elevated troponin   5. Non-ischemic cardiomyopathy ([name])      PLAN:  In order of problems listed above:  Iron def anemia  - Hgb 7.7  - Was getting iron IV, but afraid.  - Gastric AVM  Chronic systolic heart failure/nonischemic cardiomyopathy  - Continue with carvedilol 6.25 mg twice a day  - Entresto  - Lasix  - Thankfully, no coronary disease on catheterization. Nonischemic.  Elevated troponin  - Was demand ischemia 0.12.      Medication Adjustments/Labs and Tests Ordered: Current medicines are reviewed at length with the patient today.  Concerns regarding medicines are outlined above.  Medication changes, Labs and Tests ordered today are listed in the Patient Instructions below. Patient Instructions  Medication Instructions:  The current medical regimen is effective;  continue present plan and medications.  Follow-Up: Follow in 4 months with [name].  If you need a refill on your cardiac medications before your next appointment, please call your pharmacy.  Thank you for choosing [facility]!!           Signed, Candee Furbish, MD  03/19/2016 9:16 AM    [facility] [address], [place],   [address] Phone: [phone]; Fax: [phone]

## 2016-03-19 NOTE — Patient Instructions (Signed)
Medication Instructions:  The current medical regimen is effective;  continue present plan and medications.  Follow-Up: Follow in 4 months with [name].  If you need a refill on your cardiac medications before your next appointment, please call your pharmacy.  Thank you for choosing [facility]!!

## 2016-03-19 NOTE — Telephone Encounter (Signed)
Patient called wanting to know the status of her labs. Hgb: 8.0 which has increased from her 7.7 2 weeks ago. Patient states that she is feeling ok, but a little off balanced at times. Patient would like to know what MD [name] would like to do. Message sent to MD [facility].

## 2016-03-22 ENCOUNTER — Other Ambulatory Visit: Payer: Self-pay | Admitting: *Deleted

## 2016-03-22 DIAGNOSIS — I5021 Acute systolic (congestive) heart failure: Secondary | ICD-10-CM

## 2016-03-22 MED ORDER — SACUBITRIL-VALSARTAN 49-51 MG PO TABS: 1.0000 | ORAL_TABLET | Freq: Two times a day (BID) | ORAL | Status: DC

## 2016-03-22 MED ORDER — CARVEDILOL 6.25 MG PO TABS: 6.2500 mg | ORAL_TABLET | Freq: Two times a day (BID) | ORAL | Status: DC

## 2016-03-22 NOTE — Telephone Encounter (Signed)
Patient called and stated that she was told at her office visit last week that her rx's were sent to the pharmacy. She went to [facility] and they did not have them. Her rx's were sent to the mail order pharmacy which she is okay with, but only for a one month supply with each refill. I will resend for ninety day supplies.

## 2016-03-25 ENCOUNTER — Other Ambulatory Visit: Payer: Self-pay | Admitting: *Deleted

## 2016-03-25 ENCOUNTER — Other Ambulatory Visit: Payer: Self-pay | Admitting: Hematology

## 2016-03-29 ENCOUNTER — Other Ambulatory Visit: Payer: Self-pay | Admitting: *Deleted

## 2016-03-30 ENCOUNTER — Other Ambulatory Visit: Payer: Self-pay | Admitting: *Deleted

## 2016-03-30 ENCOUNTER — Encounter: Payer: Self-pay | Admitting: *Deleted

## 2016-03-30 ENCOUNTER — Other Ambulatory Visit: Payer: Self-pay | Admitting: Hematology

## 2016-03-30 DIAGNOSIS — I5021 Acute systolic (congestive) heart failure: Secondary | ICD-10-CM

## 2016-03-30 NOTE — Progress Notes (Signed)
err

## 2016-03-30 NOTE — Telephone Encounter (Signed)
Patient called in to say that [facility] is holding her Connie [name] until they talk to her cardiologist office. She has been out of her meds for two days now and she ask if someone could please calll [employer] as they we only speak to the doctors office to explain.

## 2016-04-01 ENCOUNTER — Other Ambulatory Visit: Payer: Self-pay | Admitting: *Deleted

## 2016-04-01 ENCOUNTER — Telehealth: Payer: Self-pay | Admitting: Family Medicine

## 2016-04-01 NOTE — Telephone Encounter (Signed)
Pt called and wanted to let you know that she got her IV Iron treatment scheduled for tomorrow!!

## 2016-04-01 NOTE — Telephone Encounter (Signed)
Pt states she got a letter in the mail and they have rescheduled her iron appt for 04/30/2016. She would like to come by tomorrow and get her blood checked. Victorino December

## 2016-04-02 ENCOUNTER — Other Ambulatory Visit (HOSPITAL_BASED_OUTPATIENT_CLINIC_OR_DEPARTMENT_OTHER): Payer: Commercial Managed Care - HMO

## 2016-04-02 ENCOUNTER — Ambulatory Visit: Payer: Commercial Managed Care - HMO | Admitting: Hematology

## 2016-04-02 ENCOUNTER — Ambulatory Visit (HOSPITAL_BASED_OUTPATIENT_CLINIC_OR_DEPARTMENT_OTHER): Payer: Commercial Managed Care - HMO

## 2016-04-02 ENCOUNTER — Other Ambulatory Visit: Payer: Commercial Managed Care - HMO

## 2016-04-02 ENCOUNTER — Ambulatory Visit: Payer: Commercial Managed Care - HMO

## 2016-04-02 VITALS — BP 175/48 | HR 64 | Temp 98.6°F | Resp 18

## 2016-04-02 DIAGNOSIS — D500 Iron deficiency anemia secondary to blood loss (chronic): Secondary | ICD-10-CM

## 2016-04-02 LAB — CBC & DIFF AND RETIC
BASO%: 0.6 % (ref 0.0–2.0)
BASOS ABS: 0 10*3/uL (ref 0.0–0.1)
EOS%: 8.1 % — AB (ref 0.0–7.0)
Eosinophils Absolute: 0.4 10*3/uL (ref 0.0–0.5)
HEMATOCRIT: 28.4 % — AB (ref 34.8–46.6)
HGB: 8.7 g/dL — ABNORMAL LOW (ref 11.6–15.9)
Immature Retic Fract: 5.3 % (ref 1.60–10.00)
LYMPH#: 0.9 10*3/uL (ref 0.9–3.3)
LYMPH%: 18.2 % (ref 14.0–49.7)
MCH: 27.3 pg (ref 25.1–34.0)
MCHC: 30.6 g/dL — AB (ref 31.5–36.0)
MCV: 89 fL (ref 79.5–101.0)
MONO#: 0.4 10*3/uL (ref 0.1–0.9)
MONO%: 8.5 % (ref 0.0–14.0)
NEUT#: 3.1 10*3/uL (ref 1.5–6.5)
NEUT%: 64.6 % (ref 38.4–76.8)
Platelets: 287 10*3/uL (ref 145–400)
RBC: 3.19 10*6/uL — AB (ref 3.70–5.45)
RDW: 16.3 % — AB (ref 11.2–14.5)
RETIC %: 1.28 % (ref 0.70–2.10)
RETIC CT ABS: 40.83 10*3/uL (ref 33.70–90.70)
WBC: 4.8 10*3/uL (ref 3.9–10.3)

## 2016-04-02 LAB — FERRITIN: Ferritin: 20 ng/ml (ref 9–269)

## 2016-04-02 MED ORDER — FUROSEMIDE 10 MG/ML IJ SOLN
INTRAMUSCULAR | Status: AC
  Filled 2016-04-02: qty 2

## 2016-04-02 MED ORDER — ACETAMINOPHEN 325 MG PO TABS
650.0000 mg | ORAL_TABLET | Freq: Once | ORAL | Status: AC
  Administered 2016-04-02: 650 mg via ORAL

## 2016-04-02 MED ORDER — FUROSEMIDE 10 MG/ML IJ SOLN
20.0000 mg | Freq: Once | INTRAMUSCULAR | Status: AC
  Administered 2016-04-02: 20 mg via INTRAVENOUS

## 2016-04-02 MED ORDER — DIPHENHYDRAMINE HCL 25 MG PO TABS
25.0000 mg | ORAL_TABLET | Freq: Once | ORAL | Status: AC
  Administered 2016-04-02: 25 mg via ORAL
  Filled 2016-04-02: qty 1

## 2016-04-02 MED ORDER — SODIUM CHLORIDE 0.9 % IV SOLN
INTRAVENOUS | Status: DC
  Administered 2016-04-02: 12:00:00 via INTRAVENOUS

## 2016-04-02 MED ORDER — SODIUM CHLORIDE 0.9 % IV SOLN
750.0000 mg | Freq: Once | INTRAVENOUS | Status: AC
  Administered 2016-04-02: 750 mg via INTRAVENOUS
  Filled 2016-04-02: qty 15

## 2016-04-02 MED ORDER — DIPHENHYDRAMINE HCL 25 MG PO CAPS
ORAL_CAPSULE | ORAL | Status: AC
  Filled 2016-04-02: qty 1

## 2016-04-02 MED ORDER — ACETAMINOPHEN 325 MG PO TABS
ORAL_TABLET | ORAL | Status: AC
  Filled 2016-04-02: qty 2

## 2016-04-02 NOTE — Patient Instructions (Signed)
Ferric carboxymaltose injection  What is this medicine?  FERRIC CARBOXYMALTOSE (ferr-ik car-box-ee-mol-toes) is an iron complex. Iron is used to make healthy red blood cells, which carry oxygen and nutrients throughout the body. This medicine is used to treat anemia in people with chronic kidney disease or people who cannot take iron by mouth.  This medicine may be used for other purposes; ask your health care provider or pharmacist if you have questions.  What should I tell my health care provider before I take this medicine?  They need to know if you have any of these conditions:  -anemia not caused by low iron levels  -high levels of iron in the blood  -liver disease  -an unusual or allergic reaction to iron, other medicines, foods, dyes, or preservatives  -pregnant or trying to get pregnant  -breast-feeding  How should I use this medicine?  This medicine is for infusion into a vein. It is given by a health care professional in a hospital or clinic setting.  Talk to your pediatrician regarding the use of this medicine in children. Special care may be needed.  Overdosage: If you think you have taken too much of this medicine contact a poison control center or emergency room at once.  NOTE: This medicine is only for you. Do not share this medicine with others.  What if I miss a dose?  It is important not to miss your dose. Call your doctor or health care professional if you are unable to keep an appointment.  What may interact with this medicine?  Do not take this medicine with any of the following medications:  -deferoxamine  -dimercaprol  -other iron products  This medicine may also interact with the following medications:  -chloramphenicol  -deferasirox  This list may not describe all possible interactions. Give your health care provider a list of all the medicines, herbs, non-prescription drugs, or dietary supplements you use. Also tell them if you smoke, drink alcohol, or use illegal drugs. Some items may  interact with your medicine.  What should I watch for while using this medicine?  Visit your doctor or health care professional regularly. Tell your doctor if your symptoms do not start to get better or if they get worse. You may need blood work done while you are taking this medicine.  You may need to follow a special diet. Talk to your doctor. Foods that contain iron include: whole grains/cereals, dried fruits, beans, or peas, leafy green vegetables, and organ meats (liver, kidney).  What side effects may I notice from receiving this medicine?  Side effects that you should report to your doctor or health care professional as soon as possible:  -allergic reactions like skin rash, itching or hives, swelling of the face, lips, or tongue -breathing problems  -changes in blood pressure  -feeling faint or lightheaded, falls  -flushing, sweating, or hot feelings  Side effects that usually do not require medical attention (Report these to your doctor or health care professional if they continue or are bothersome.):  -changes in taste  -constipation  -dizziness  -headache  -nausea  -pain, redness, or irritation at site where injected  -vomiting  This list may not describe all possible side effects. Call your doctor for medical advice about side effects. You may report side effects to FDA at 1-800-FDA-1088.  Where should I keep my medicine?  This drug is given in a hospital or clinic and will not be stored at home.  NOTE: This sheet is a   summary. It may not cover all possible information. If you have questions about this medicine, talk to your doctor, pharmacist, or health care provider.      2016, Elsevier/Gold Standard. (2012-04-21 15:36:34)

## 2016-04-02 NOTE — Telephone Encounter (Signed)
Pt called back and stated that the [facility] called and they will be doing her blood work over there.

## 2016-04-05 ENCOUNTER — Other Ambulatory Visit: Payer: Self-pay

## 2016-04-05 ENCOUNTER — Telehealth: Payer: Self-pay | Admitting: Family Medicine

## 2016-04-05 MED ORDER — PANTOPRAZOLE SODIUM 20 MG PO TBEC: 20.0000 mg | DELAYED_RELEASE_TABLET | Freq: Every day | ORAL | Status: DC

## 2016-04-05 NOTE — Telephone Encounter (Signed)
Pt's husband call for refills of pantoprazole sent to [facility] mail order.

## 2016-04-05 NOTE — Telephone Encounter (Signed)
Med sent in.

## 2016-04-06 ENCOUNTER — Ambulatory Visit: Payer: Commercial Managed Care - HMO | Admitting: Hematology

## 2016-04-06 ENCOUNTER — Other Ambulatory Visit: Payer: Commercial Managed Care - HMO

## 2016-04-08 ENCOUNTER — Telehealth: Payer: Self-pay | Admitting: *Deleted

## 2016-04-08 ENCOUNTER — Other Ambulatory Visit: Payer: Self-pay | Admitting: *Deleted

## 2016-04-08 NOTE — Telephone Encounter (Signed)
"  I need to know if I still need the lab and MD visit on April 30, 2016.  I had treatment rescheduled for tomorrow 04-09-2016.  Please call home number [phone].

## 2016-04-09 ENCOUNTER — Ambulatory Visit (HOSPITAL_BASED_OUTPATIENT_CLINIC_OR_DEPARTMENT_OTHER): Payer: Commercial Managed Care - HMO

## 2016-04-09 VITALS — BP 155/41 | HR 58 | Temp 98.1°F | Resp 16

## 2016-04-09 DIAGNOSIS — D500 Iron deficiency anemia secondary to blood loss (chronic): Secondary | ICD-10-CM

## 2016-04-09 DIAGNOSIS — K922 Gastrointestinal hemorrhage, unspecified: Secondary | ICD-10-CM

## 2016-04-09 MED ORDER — SODIUM CHLORIDE 0.9 % IV SOLN
Freq: Once | INTRAVENOUS | Status: AC
  Administered 2016-04-09: 12:00:00 via INTRAVENOUS

## 2016-04-09 MED ORDER — ACETAMINOPHEN 325 MG PO TABS
ORAL_TABLET | ORAL | Status: AC
  Filled 2016-04-09: qty 2

## 2016-04-09 MED ORDER — DIPHENHYDRAMINE HCL 25 MG PO CAPS
ORAL_CAPSULE | ORAL | Status: AC
  Filled 2016-04-09: qty 1

## 2016-04-09 MED ORDER — FUROSEMIDE 10 MG/ML IJ SOLN
20.0000 mg | Freq: Once | INTRAMUSCULAR | Status: AC
  Administered 2016-04-09: 20 mg via INTRAVENOUS

## 2016-04-09 MED ORDER — FUROSEMIDE 10 MG/ML IJ SOLN
INTRAMUSCULAR | Status: AC
  Filled 2016-04-09: qty 2

## 2016-04-09 MED ORDER — FERRIC CARBOXYMALTOSE 750 MG/15ML IV SOLN
750.0000 mg | Freq: Once | INTRAVENOUS | Status: AC
  Administered 2016-04-09: 750 mg via INTRAVENOUS
  Filled 2016-04-09: qty 15

## 2016-04-09 MED ORDER — DIPHENHYDRAMINE HCL 25 MG PO TABS
25.0000 mg | ORAL_TABLET | Freq: Once | ORAL | Status: AC
  Administered 2016-04-09: 25 mg via ORAL
  Filled 2016-04-09: qty 1

## 2016-04-09 MED ORDER — ACETAMINOPHEN 325 MG PO TABS
650.0000 mg | ORAL_TABLET | Freq: Once | ORAL | Status: AC
  Administered 2016-04-09: 650 mg via ORAL

## 2016-04-09 NOTE — Patient Instructions (Signed)
Ferric carboxymaltose injection  What is this medicine?  FERRIC CARBOXYMALTOSE (ferr-ik car-box-ee-mol-toes) is an iron complex. Iron is used to make healthy red blood cells, which carry oxygen and nutrients throughout the body. This medicine is used to treat anemia in people with chronic kidney disease or people who cannot take iron by mouth.  This medicine may be used for other purposes; ask your health care provider or pharmacist if you have questions.  What should I tell my health care provider before I take this medicine?  They need to know if you have any of these conditions:  -anemia not caused by low iron levels  -high levels of iron in the blood  -liver disease  -an unusual or allergic reaction to iron, other medicines, foods, dyes, or preservatives  -pregnant or trying to get pregnant  -breast-feeding  How should I use this medicine?  This medicine is for infusion into a vein. It is given by a health care professional in a hospital or clinic setting.  Talk to your pediatrician regarding the use of this medicine in children. Special care may be needed.  Overdosage: If you think you have taken too much of this medicine contact a poison control center or emergency room at once.  NOTE: This medicine is only for you. Do not share this medicine with others.  What if I miss a dose?  It is important not to miss your dose. Call your doctor or health care professional if you are unable to keep an appointment.  What may interact with this medicine?  Do not take this medicine with any of the following medications:  -deferoxamine  -dimercaprol  -other iron products  This medicine may also interact with the following medications:  -chloramphenicol  -deferasirox  This list may not describe all possible interactions. Give your health care provider a list of all the medicines, herbs, non-prescription drugs, or dietary supplements you use. Also tell them if you smoke, drink alcohol, or use illegal drugs. Some items may  interact with your medicine.  What should I watch for while using this medicine?  Visit your doctor or health care professional regularly. Tell your doctor if your symptoms do not start to get better or if they get worse. You may need blood work done while you are taking this medicine.  You may need to follow a special diet. Talk to your doctor. Foods that contain iron include: whole grains/cereals, dried fruits, beans, or peas, leafy green vegetables, and organ meats (liver, kidney).  What side effects may I notice from receiving this medicine?  Side effects that you should report to your doctor or health care professional as soon as possible:  -allergic reactions like skin rash, itching or hives, swelling of the face, lips, or tongue -breathing problems  -changes in blood pressure  -feeling faint or lightheaded, falls  -flushing, sweating, or hot feelings  Side effects that usually do not require medical attention (Report these to your doctor or health care professional if they continue or are bothersome.):  -changes in taste  -constipation  -dizziness  -headache  -nausea  -pain, redness, or irritation at site where injected  -vomiting  This list may not describe all possible side effects. Call your doctor for medical advice about side effects. You may report side effects to FDA at 1-800-FDA-1088.  Where should I keep my medicine?  This drug is given in a hospital or clinic and will not be stored at home.  NOTE: This sheet is a   summary. It may not cover all possible information. If you have questions about this medicine, talk to your doctor, pharmacist, or health care provider.      2016, Elsevier/Gold Standard. (2012-04-21 15:36:34)

## 2016-04-13 ENCOUNTER — Telehealth: Payer: Self-pay

## 2016-04-13 ENCOUNTER — Telehealth: Payer: Self-pay | Admitting: Hematology

## 2016-04-13 MED ORDER — SACUBITRIL-VALSARTAN 49-51 MG PO TABS: 1.0000 | ORAL_TABLET | Freq: Two times a day (BID) | ORAL | 3 refills | Status: DC

## 2016-04-13 NOTE — Telephone Encounter (Signed)
I have attempted to obtain a prior auth for Entresto 40-51 from [place]. Cover my Meds is saying there is already one on file. Refill was sent to [facility] on 03/22/2016, however patient has not received it yet. She only has 3 pills left. Samples provided for 2 weeks. I have left her a message to call me back so I can inform her of this.

## 2016-04-13 NOTE — Telephone Encounter (Signed)
Called patient to confirm appointment. Left message. Appointment letter and schedule mailed. Maria F. °

## 2016-04-13 NOTE — Telephone Encounter (Signed)
Spoke with a Education administrator at [employer] Connie [name] goes thru as paid claim, however they do not have a credit card on file for her. I left her a detailed message to this affect, with phone # for Customer Service.

## 2016-04-22 DIAGNOSIS — H353211 Exudative age-related macular degeneration, right eye, with active choroidal neovascularization: Secondary | ICD-10-CM | POA: Diagnosis not present

## 2016-04-28 ENCOUNTER — Ambulatory Visit (HOSPITAL_BASED_OUTPATIENT_CLINIC_OR_DEPARTMENT_OTHER): Payer: Commercial Managed Care - HMO | Admitting: Hematology

## 2016-04-28 ENCOUNTER — Encounter: Payer: Self-pay | Admitting: Hematology

## 2016-04-28 ENCOUNTER — Other Ambulatory Visit (HOSPITAL_BASED_OUTPATIENT_CLINIC_OR_DEPARTMENT_OTHER): Payer: Commercial Managed Care - HMO

## 2016-04-28 ENCOUNTER — Telehealth: Payer: Self-pay | Admitting: Hematology

## 2016-04-28 VITALS — BP 189/46 | HR 66 | Temp 98.2°F | Resp 18 | Ht <= 58 in | Wt 131.6 lb

## 2016-04-28 DIAGNOSIS — D500 Iron deficiency anemia secondary to blood loss (chronic): Secondary | ICD-10-CM

## 2016-04-28 DIAGNOSIS — E039 Hypothyroidism, unspecified: Secondary | ICD-10-CM

## 2016-04-28 DIAGNOSIS — K31811 Angiodysplasia of stomach and duodenum with bleeding: Secondary | ICD-10-CM

## 2016-04-28 DIAGNOSIS — K922 Gastrointestinal hemorrhage, unspecified: Secondary | ICD-10-CM

## 2016-04-28 DIAGNOSIS — D509 Iron deficiency anemia, unspecified: Secondary | ICD-10-CM | POA: Diagnosis not present

## 2016-04-28 LAB — CBC & DIFF AND RETIC
BASO%: 0.4 % (ref 0.0–2.0)
BASOS ABS: 0 10*3/uL (ref 0.0–0.1)
EOS%: 5.9 % (ref 0.0–7.0)
Eosinophils Absolute: 0.3 10*3/uL (ref 0.0–0.5)
HEMATOCRIT: 31.9 % — AB (ref 34.8–46.6)
HEMOGLOBIN: 9.9 g/dL — AB (ref 11.6–15.9)
Immature Retic Fract: 3.5 % (ref 1.60–10.00)
LYMPH#: 0.6 10*3/uL — AB (ref 0.9–3.3)
LYMPH%: 13.4 % — AB (ref 14.0–49.7)
MCH: 29.2 pg (ref 25.1–34.0)
MCHC: 31 g/dL — ABNORMAL LOW (ref 31.5–36.0)
MCV: 94.1 fL (ref 79.5–101.0)
MONO#: 0.5 10*3/uL (ref 0.1–0.9)
MONO%: 10.3 % (ref 0.0–14.0)
NEUT#: 3.4 10*3/uL (ref 1.5–6.5)
NEUT%: 70 % (ref 38.4–76.8)
PLATELETS: 191 10*3/uL (ref 145–400)
RBC: 3.39 10*6/uL — ABNORMAL LOW (ref 3.70–5.45)
RDW: 18.4 % — ABNORMAL HIGH (ref 11.2–14.5)
RETIC %: 1.12 % (ref 0.70–2.10)
Retic Ct Abs: 37.97 10*3/uL (ref 33.70–90.70)
WBC: 4.8 10*3/uL (ref 3.9–10.3)
nRBC: 0 % (ref 0–0)

## 2016-04-28 LAB — COMPREHENSIVE METABOLIC PANEL
ALBUMIN: 3.4 g/dL — AB (ref 3.5–5.0)
ALK PHOS: 67 U/L (ref 40–150)
ALT: <9 U/L (ref 0–55)
ANION GAP: 7 meq/L (ref 3–11)
AST: 17 U/L (ref 5–34)
BILIRUBIN TOTAL: 0.42 mg/dL (ref 0.20–1.20)
BUN: 22.6 mg/dL (ref 7.0–26.0)
CALCIUM: 8.2 mg/dL — AB (ref 8.4–10.4)
CO2: 25 mEq/L (ref 22–29)
CREATININE: 1.1 mg/dL (ref 0.6–1.1)
Chloride: 112 mEq/L — ABNORMAL HIGH (ref 98–109)
EGFR: 47 mL/min/{1.73_m2} — ABNORMAL LOW (ref >90)
Glucose: 105 mg/dl (ref 70–140)
POTASSIUM: 4.3 meq/L (ref 3.5–5.1)
Sodium: 143 mEq/L (ref 136–145)
Total Protein: 6.2 g/dL — ABNORMAL LOW (ref 6.4–8.3)

## 2016-04-28 LAB — FERRITIN: FERRITIN: 437 ng/mL — AB (ref 9–269)

## 2016-04-28 NOTE — Telephone Encounter (Signed)
Gave pt cal & avs °

## 2016-04-30 ENCOUNTER — Ambulatory Visit: Payer: Commercial Managed Care - HMO

## 2016-04-30 ENCOUNTER — Other Ambulatory Visit: Payer: Commercial Managed Care - HMO

## 2016-04-30 ENCOUNTER — Ambulatory Visit: Payer: Commercial Managed Care - HMO | Admitting: Hematology

## 2016-05-01 NOTE — Progress Notes (Signed)
[facility]    HEMATOLOGY/ONCOLOGY CLINIC NOTE  Date of Service: 04/28/2016  Patient Care Team: [name], MD as PCP - General (Family Medicine)  CHIEF COMPLAINTS/PURPOSE OF CONSULTATION:  Iron deficiency anemia due to GI bleeding from multiple AVMs  HISTORY OF PRESENTING ILLNESS:   Connie [name] is a wonderful 82 y.o. female who I had seen in the hospital on 3/3/2017when she presented with severe some dramatic anemia requiring blood transfusions.  Patient has a history of hypertension, hypothyroidism, diabetes and has had issues with recurrent gastrointestinal bleeds related to both upper and lower GI tract AVMs which have been ablated multiple times in the past.  She has also had issues with gastric erosions.  She reports that on 2 occasions of bleeding has been severe enough to require blood transfusions. Her hemoglobin was noted to be 5.4 with an MCV of 79.9 on her presentation.  She was noted to have been on aspirin for primary prophylaxis. Her fecal occult blood testing was noted to be negative.  Hemolytic markers were negative. Ferritin was noted to be 34 with a saturation of 18%. Patient received 4 units of PRBCs.  Her aspirin was held.she was given IV feraheme .   Patient returns for followup clinic today. Her hemoglobin on discharge was 10.6 with an MCV of 84.4.  Her hemoglobin is down to 8.8 today with an MCV of 86.6.RDW is increasing.  Ferritin level was noted to be 18. Informed consent was obtained to set her up for IV iron in the clinic. Patient noted no overt GI bleeding.  INTERVAL HISTORY  Connie [name] is here for followup for her IDA after having received the IV Injectafer. She notes that she tolerated it without any issues and feels like her energy level is much better. No other acute new symptoms. Had a good summer break at [facility].    MEDICAL HISTORY:  Past Medical History:  Diagnosis Date  . Allergy    RHINITIS  . Angiodysplasia of duodenum    hx/notes 11/21/2015  . Angiodysplasia of stomach    hx/notes 11/21/2015  . Arthritis    "joints ache"  . Bleeding gastric ulcer    hx/notes 11/21/2015  . Chronic blood loss anemia    secondary to angiodysplasia of the stomach and duodenum as well as history of bleeding gastric ulcer hx/notes 11/21/2015  . History of blood transfusion 01/2015; 06/2015; 11/21/2015  . Hypercholesteremia   . Hypertension   . Hypothyroidism   . Obesity   . Pneumonia "several times"  . Type II diabetes mellitus ([place])     SURGICAL HISTORY: Past Surgical History:  Procedure Laterality Date  . CARDIAC CATHETERIZATION N/A 12/19/2015   Procedure: Left Heart Cath and Coronary Angiography;  Surgeon: Leonie Man, MD;  Location: [place] [facility];  Service: Cardiovascular;  Laterality: N/A;  . COLONOSCOPY N/A 08/30/2014   Procedure: COLONOSCOPY;  Surgeon: Beryle Beams, MD;  Location: [facility] ENDOSCOPY;  Service: Endoscopy;  Laterality: N/A;  . COLONOSCOPY N/A 02/21/2015   Procedure: COLONOSCOPY;  Surgeon: Carol Ada, MD;  Location: [facility] ENDOSCOPY;  Service: Endoscopy;  Laterality: N/A;  . ENTEROSCOPY N/A 06/06/2015   Procedure: ENTEROSCOPY;  Surgeon: Carol Ada, MD;  Location: [facility] ENDOSCOPY;  Service: Endoscopy;  Laterality: N/A;  . FRACTURE SURGERY    . GIVENS CAPSULE STUDY N/A 05/15/2015   Procedure: GIVENS CAPSULE STUDY;  Surgeon: Carol Ada, MD;  Location: [facility] ENDOSCOPY;  Service: Endoscopy;  Laterality: N/A;  . HOT HEMOSTASIS N/A 08/30/2014  Procedure: HOT HEMOSTASIS (ARGON PLASMA COAGULATION/BICAP);  Surgeon: Beryle Beams, MD;  Location: [facility] ENDOSCOPY;  Service: Endoscopy;  Laterality: N/A;  . HOT HEMOSTASIS N/A 06/06/2015   Procedure: HOT HEMOSTASIS (ARGON PLASMA COAGULATION/BICAP);  Surgeon: Carol Ada, MD;  Location: [facility] ENDOSCOPY;  Service: Endoscopy;  Laterality: N/A;  . JOINT REPLACEMENT    . REVISION TOTAL KNEE ARTHROPLASTY Bilateral 2004-2015  . SHOULDER OPEN ROTATOR CUFF REPAIR Right 12/2004   open  subacromial decompression, distal clavicle  resection, rotator cuff repair/notes 02/02/2011  . TONSILLECTOMY  1940s  . TOTAL KNEE ARTHROPLASTY Bilateral ~ 2002  . TOTAL THYROIDECTOMY  ~ 1966    SOCIAL HISTORY: Social History   Social History  . Marital status: Married    Spouse name: N/A  . Number of children: N/A  . Years of education: N/A   Occupational History  . Not on file.   Social History Main Topics  . Smoking status: Never Smoker  . Smokeless tobacco: Never Used  . Alcohol use No  . Drug use: No  . Sexual activity: Not Currently   Other Topics Concern  . Not on file   Social History Narrative  . No narrative on file    FAMILY HISTORY: Family History  Problem Relation Age of Onset  . Asthma Mother   . Ulcers Father   . Anemia Sister     ALLERGIES:  has No Known Allergies.  MEDICATIONS:  Current Outpatient Prescriptions  Medication Sig Dispense Refill  . amLODipine (NORVASC) 10 MG tablet Take 1 tablet by mouth daily.    Marland Kitchen atorvastatin (LIPITOR) 40 MG tablet TAKE ONE TABLET BY MOUTH ONCE DAILY 90 tablet 3  . carvedilol (COREG) 6.25 MG tablet Take 1 tablet (6.25 mg total) by mouth 2 (two) times daily with a meal. 180 tablet 3  . furosemide (LASIX) 20 MG tablet Take 1 tablet (20 mg total) by mouth daily. 90 tablet 0  . levothyroxine (SYNTHROID, LEVOTHROID) 125 MCG tablet TAKE 1 TABLET EVERY DAY 90 tablet 3  . Multiple Vitamins-Minerals (PRESERVISION AREDS 2) CAPS Take by mouth.    . Multiple Vitamins-Minerals (WOMENS MULTIVITAMIN PLUS PO) Take 1 tablet by mouth daily.    . pantoprazole (PROTONIX) 20 MG tablet Take 1 tablet (20 mg total) by mouth daily. 90 tablet 3  . pioglitazone (ACTOS) 30 MG tablet TAKE 1 TABLET EVERY DAY 90 tablet 1  . potassium chloride (K-DUR,KLOR-CON) 10 MEQ tablet Take 1 tablet (10 mEq total) by mouth 2 (two) times daily. 180 tablet 1  . sacubitril-valsartan (ENTRESTO) 49-51 MG Take 1 tablet by mouth 2 (two) times daily. 180 tablet 3    . traZODone (DESYREL) 50 MG tablet Take 1/2 tablet by mouth at night as needed for sleep    . triamcinolone cream (KENALOG) 0.1 % Apply 1 application topically 2 (two) times daily. 30 g 0   No current facility-administered medications for this visit.     REVIEW OF SYSTEMS:    10 Point review of Systems was done is negative except as noted above.  PHYSICAL EXAMINATION: ECOG PERFORMANCE STATUS: 1 - Symptomatic but completely ambulatory  . Vitals:   04/28/16 1008  BP: (!) 189/46  Pulse: 66  Resp: 18  Temp: 98.2 F (36.8 C)   Filed Weights   04/28/16 1008  Weight: 131 lb 9.6 oz (59.7 kg)   .Body mass index is 27.5 kg/m.  GENERAL:alert, in no acute distress and comfortable SKIN: skin color, texture, turgor are normal, no rashes or significant  lesions EYES: normal, conjunctiva are pink and non-injected, sclera clear OROPHARYNX:no exudate, no erythema and lips, buccal mucosa, and tongue normal  NECK: supple, no JVD, thyroid normal size, non-tender, without nodularity LYMPH:  no palpable lymphadenopathy in the cervical, axillary or inguinal LUNGS: clear to auscultation with normal respiratory effort HEART: regular rate & rhythm,  no murmurs and no lower extremity edema ABDOMEN: abdomen soft, non-tender, normoactive bowel sounds ,  no palpable hepatosplenomegaly Musculoskeletal: no cyanosis of digits and no clubbing  PSYCH: alert & oriented x 3 with fluent speech NEURO: no focal motor/sensory deficits  LABORATORY DATA:   . CBC Latest Ref Rng & Units 04/28/2016 04/02/2016 03/19/2016  WBC 3.9 - 10.3 10e3/uL 4.8 4.8 4.2  Hemoglobin 11.6 - 15.9 g/dL 9.9(L) 8.7(L) 8.0(L)  Hematocrit 34.8 - 46.6 % 31.9(L) 28.4(L) 26.4(L)  Platelets 145 - 400 10e3/uL 191 287 296    . CBC    Component Value Date/Time   WBC 4.8 04/28/2016 0944   WBC 7.1 02/23/2016 1027   RBC 3.39 (L) 04/28/2016 0944   RBC 2.76 (L) 02/23/2016 1027   HGB 9.9 (L) 04/28/2016 0944   HCT 31.9 (L) 04/28/2016 0944    PLT 191 04/28/2016 0944   MCV 94.1 04/28/2016 0944   MCH 29.2 04/28/2016 0944   MCH 27.9 02/23/2016 1027   MCHC 31.0 (L) 04/28/2016 0944   MCHC 32.0 02/23/2016 1027   RDW 18.4 (H) 04/28/2016 0944   LYMPHSABS 0.6 (L) 04/28/2016 0944   MONOABS 0.5 04/28/2016 0944   EOSABS 0.3 04/28/2016 0944   BASOSABS 0.0 04/28/2016 0944    . CMP Latest Ref Rng & Units 04/28/2016 03/19/2016 03/05/2016  Glucose 70 - 140 mg/dl 105 116 105  BUN 7.0 - 26.0 mg/dL 22.6 28.2(H) 14.7  Creatinine 0.6 - 1.1 mg/dL 1.1 1.1 1.1  Sodium 136 - 145 mEq/L 143 144 142  Potassium 3.5 - 5.1 mEq/L 4.3 3.7 4.3  Chloride 98 - 110 mmol/L - - -  CO2 22 - 29 mEq/L 25 24 26   Calcium 8.4 - 10.4 mg/dL 8.2(L) 8.6 8.4  Total Protein 6.4 - 8.3 g/dL 6.2(L) - 6.2(L)  Total Bilirubin 0.20 - 1.20 mg/dL 0.42 - 0.46  Alkaline Phos 40 - 150 U/L 67 - 62  AST 5 - 34 U/L 17 - 16  ALT 0 - 55 U/L <9 - <9    Lab Results  Component Value Date   FERRITIN 437 (H) 04/28/2016     ASSESSMENT & PLAN:   82 year old Caucasian female with  #1 Microcytic anemia likely related to iron deficiency from chronic GI bleeding. No known disorder causing chronic inflammation at this time. Ferritin was down trending from 34 to 19. Now 437 after IV injectafer 750mg  weekly x 2 doses.  #2 history of recurrent GI bleeding related to upper and lower GI tract AVMs and gastric erosions. Recently admitted with symptomatic anemia in early March 2017 and required 4 units of PRBCs  #3 status post acute renal failure in January 2017 currently creatinine within normal limits-  #4 hypothyroidism on levothyroxine .  #5 ?? Reaction to [facility] - current hospital admission that shortness of breath appears to be more likely related to CHF decompensation than allergic reaction to Feraheme. However patient is very anxious and hesitant to get this preparation.  Plan  -patient is currently off ASA to try to reduce her risk of GI bleeding though she continues to remain at  high-risk for GI bleeding given her extensive AVMs. -The central treatment  is GI follow-up to ablate AVMs and 2 was possible to control her GI bleeding. Which she should continue. -we will continue to f/u for  chronic blood loss anemia which would treat with aggressive IV iron replacement to maintain ferritin levels of >100. -No indication for IV iron at this time. -continue daily multivitamin orally. -Continue followup with GI for further evaluation and management of AVMs as needed. -Continue close followup with primary care physician for management of other medical issues. (HTN control)   Return to care with Dr. [name] in 6 weeks with repeat CBC, CMP and ferritin to reassess status of anemia.  All of the patients questions were answered with apparent satisfaction. The patient knows to call the clinic with any problems, questions or concerns.  I spent 20 minutes counseling the patient face to face. The total time spent in the appointment was 20 minutes and more than 50% was on counseling and direct patient cares.    Sullivan Lone MD [place] AAHIVMS [facility] Hematology/Oncology Physician [facility]  (Office):       [phone] (Work cell):  [phone] (Fax):           [phone]

## 2016-05-12 ENCOUNTER — Encounter: Payer: Self-pay | Admitting: Family Medicine

## 2016-05-12 ENCOUNTER — Ambulatory Visit (INDEPENDENT_AMBULATORY_CARE_PROVIDER_SITE_OTHER): Payer: Commercial Managed Care - HMO | Admitting: Family Medicine

## 2016-05-12 VITALS — BP 118/70 | HR 60 | Ht <= 58 in | Wt 132.0 lb

## 2016-05-12 DIAGNOSIS — E1121 Type 2 diabetes mellitus with diabetic nephropathy: Secondary | ICD-10-CM

## 2016-05-12 DIAGNOSIS — E1169 Type 2 diabetes mellitus with other specified complication: Secondary | ICD-10-CM

## 2016-05-12 DIAGNOSIS — H353290 Exudative age-related macular degeneration, unspecified eye, stage unspecified: Secondary | ICD-10-CM

## 2016-05-12 DIAGNOSIS — K31811 Angiodysplasia of stomach and duodenum with bleeding: Secondary | ICD-10-CM | POA: Diagnosis not present

## 2016-05-12 DIAGNOSIS — E118 Type 2 diabetes mellitus with unspecified complications: Secondary | ICD-10-CM | POA: Diagnosis not present

## 2016-05-12 DIAGNOSIS — E039 Hypothyroidism, unspecified: Secondary | ICD-10-CM | POA: Diagnosis not present

## 2016-05-12 DIAGNOSIS — D692 Other nonthrombocytopenic purpura: Secondary | ICD-10-CM | POA: Diagnosis not present

## 2016-05-12 DIAGNOSIS — E785 Hyperlipidemia, unspecified: Secondary | ICD-10-CM

## 2016-05-12 DIAGNOSIS — D500 Iron deficiency anemia secondary to blood loss (chronic): Secondary | ICD-10-CM

## 2016-05-12 LAB — LIPID PANEL
Cholesterol: 137 mg/dL (ref 125–200)
HDL: 47 mg/dL (ref >=46)
LDL CALC: 66 mg/dL (ref <130)
Total CHOL/HDL Ratio: 2.9 Ratio (ref <=5.0)
Triglycerides: 119 mg/dL (ref <150)
VLDL: 24 mg/dL (ref <30)

## 2016-05-12 LAB — POCT UA - MICROALBUMIN
Creatinine, POC: <15 mg/dL
Microalbumin Ur, POC: 41.4 mg/L

## 2016-05-12 NOTE — Progress Notes (Signed)
  Subjective:    Patient ID: Connie [name], female    DOB: 1933/11/10, 82 y.o.   MRN: [id number]  Connie [name] is a 82 y.o. female who presents for follow-up of Type 2 diabetes mellitus.  Patient is not checking home blood sugars.   Home blood sugar records: None How often is blood sugars being checked: not at all Current symptoms/problem none Daily foot checks: yes   Any foot concerns: none Last eye exam: 4/4/17She is followed regularly for her macular degeneration. Exercise: house work he does see hematology for dealing with her anemia. She was recently given a new medication to help with her erythropoiesis. Review of her record also shows an elevated TSH. She states she is taking her medication on a regular basis on an empty stomach. She does continue on Actos. She has no underlying history of elevated creatinine. Immunizations were reviewed and she is due for a tetanus shot. Her medications were reviewed. The following portions of the patient's history were reviewed and updated as appropriate: allergies, current medications, past medical history, past social history and problem list.  ROS as in subjective above.     Objective:    Physical Exam Alert and in no distress Purpuric lesion still noted on arms.    Lab Review Diabetic Labs Latest Ref Rng & Units 04/28/2016 03/19/2016 03/05/2016 01/06/2016 12/29/2015  HbA1c 4.8 - 5.6 % - - - - -  Microalbumin 0.00 - 1.89 mg/dL - - - - -  Micro/Creat Ratio 0.0 - 30.0 mg/g - - - - -  Chol 0 - 200 mg/dL - - - - -  HDL >39 mg/dL - - - - -  Calc LDL 0 - 99 mg/dL - - - - -  Triglycerides <150 mg/dL - - - - -  Creatinine 0.6 - 1.1 mg/dL 1.1 1.1 1.1 1.12(H) 1.20(H)   BP/Weight 04/28/2016 04/09/2016 04/02/2016 03/19/2016 [id number]  Systolic BP [id number]  Diastolic BP 46 41 48 72 49  Wt. (Lbs) 131.6 - - 123.4 127.8  BMI 27.5 - - 25.8 25.8   Foot/eye exam completion dates Latest Ref Rng & Units 12/23/2015 04/01/2015  Eye Exam No  Retinopathy No Retinopathy No Retinopathy  Foot Form Completion - - -  Hemoglobin A1c 5.6  Connie [name]  reports that she has never smoked. She has never used smokeless tobacco. She reports that she does not drink alcohol or use drugs.     Assessment & Plan:    AMD (age-related macular degeneration), wet (Connie [name])  Type 2 diabetes mellitus with complication, without long-term current use of insulin ([name])  Iron deficiency anemia secondary to blood loss (chronic)  Senile purpura ([name])  Angiodysplasia of stomach and duodenum with hemorrhage  Hypothyroidism, unspecified hypothyroidism type - Plan: TSH  Hyperlipidemia associated with type 2 diabetes mellitus ([place]) - Plan: Lipid panel   1. Rx changes: none 2. Education: Reviewed 'ABCs' of diabetes management (respective goals in parentheses):  A1C (<7), blood pressure (<130/80), and cholesterol (LDL <100). 3. Compliance at present is estimated to be good. Efforts to improve compliance (if necessary) will be directed at no change 4. Follow up: 4 months I think her low hemoglobin A1c is more a function of her hemoglobin production for the last several months due to her anemia and now being given a new medication. She is to follow-up with hematology in the near future.

## 2016-05-12 NOTE — Addendum Note (Signed)
Addended by: Randel Books on: 05/12/2016 04:12 PM   Modules accepted: Orders

## 2016-05-13 LAB — TSH: TSH: 1.71 mIU/L

## 2016-05-27 DIAGNOSIS — H353212 Exudative age-related macular degeneration, right eye, with inactive choroidal neovascularization: Secondary | ICD-10-CM | POA: Diagnosis not present

## 2016-06-08 ENCOUNTER — Telehealth: Payer: Self-pay | Admitting: Hematology

## 2016-06-08 NOTE — Telephone Encounter (Signed)
COVERING AP - LAB/FU MOVED FROM 9/20 TO 9/21. SPOKE WITH PATIENT SHE IS AWARE

## 2016-06-09 ENCOUNTER — Other Ambulatory Visit: Payer: Commercial Managed Care - HMO

## 2016-06-09 ENCOUNTER — Ambulatory Visit: Payer: Commercial Managed Care - HMO | Admitting: Hematology

## 2016-06-10 ENCOUNTER — Encounter: Payer: Self-pay | Admitting: Hematology

## 2016-06-10 ENCOUNTER — Other Ambulatory Visit (HOSPITAL_BASED_OUTPATIENT_CLINIC_OR_DEPARTMENT_OTHER): Payer: Commercial Managed Care - HMO

## 2016-06-10 ENCOUNTER — Ambulatory Visit (HOSPITAL_BASED_OUTPATIENT_CLINIC_OR_DEPARTMENT_OTHER): Payer: Commercial Managed Care - HMO | Admitting: Hematology

## 2016-06-10 ENCOUNTER — Telehealth: Payer: Self-pay | Admitting: Hematology

## 2016-06-10 VITALS — BP 187/50 | HR 59 | Temp 98.0°F | Resp 18 | Ht <= 58 in | Wt 132.2 lb

## 2016-06-10 DIAGNOSIS — K31811 Angiodysplasia of stomach and duodenum with bleeding: Secondary | ICD-10-CM

## 2016-06-10 DIAGNOSIS — D500 Iron deficiency anemia secondary to blood loss (chronic): Secondary | ICD-10-CM

## 2016-06-10 DIAGNOSIS — D509 Iron deficiency anemia, unspecified: Secondary | ICD-10-CM

## 2016-06-10 DIAGNOSIS — K922 Gastrointestinal hemorrhage, unspecified: Secondary | ICD-10-CM

## 2016-06-10 DIAGNOSIS — E039 Hypothyroidism, unspecified: Secondary | ICD-10-CM

## 2016-06-10 LAB — CBC & DIFF AND RETIC
BASO%: 0.4 % (ref 0.0–2.0)
BASOS ABS: 0 10*3/uL (ref 0.0–0.1)
EOS%: 8.5 % — AB (ref 0.0–7.0)
Eosinophils Absolute: 0.4 10*3/uL (ref 0.0–0.5)
HEMATOCRIT: 33.1 % — AB (ref 34.8–46.6)
HGB: 10.4 g/dL — ABNORMAL LOW (ref 11.6–15.9)
Immature Retic Fract: 8.1 % (ref 1.60–10.00)
LYMPH#: 0.8 10*3/uL — AB (ref 0.9–3.3)
LYMPH%: 17 % (ref 14.0–49.7)
MCH: 29.3 pg (ref 25.1–34.0)
MCHC: 31.4 g/dL — AB (ref 31.5–36.0)
MCV: 93.2 fL (ref 79.5–101.0)
MONO#: 0.6 10*3/uL (ref 0.1–0.9)
MONO%: 12.4 % (ref 0.0–14.0)
NEUT#: 2.8 10*3/uL (ref 1.5–6.5)
NEUT%: 61.7 % (ref 38.4–76.8)
Platelets: 232 10*3/uL (ref 145–400)
RBC: 3.55 10*6/uL — AB (ref 3.70–5.45)
RDW: 16.9 % — AB (ref 11.2–14.5)
RETIC %: 1.3 % (ref 0.70–2.10)
RETIC CT ABS: 46.15 10*3/uL (ref 33.70–90.70)
WBC: 4.6 10*3/uL (ref 3.9–10.3)

## 2016-06-10 LAB — BASIC METABOLIC PANEL
ANION GAP: 10 meq/L (ref 3–11)
BUN: 23.1 mg/dL (ref 7.0–26.0)
CO2: 25 mEq/L (ref 22–29)
Calcium: 8.7 mg/dL (ref 8.4–10.4)
Chloride: 110 mEq/L — ABNORMAL HIGH (ref 98–109)
Creatinine: 1.1 mg/dL (ref 0.6–1.1)
EGFR: 50 mL/min/{1.73_m2} — AB (ref >90)
Glucose: 91 mg/dl (ref 70–140)
POTASSIUM: 3.8 meq/L (ref 3.5–5.1)
SODIUM: 144 meq/L (ref 136–145)

## 2016-06-10 LAB — FERRITIN: FERRITIN: 248 ng/mL (ref 9–269)

## 2016-06-10 NOTE — Telephone Encounter (Signed)
Gave patient avs report and appointments for November. Lab/fu 2 mos per Dr. [name].

## 2016-06-13 NOTE — Progress Notes (Signed)
Marland Kitchen    HEMATOLOGY/ONCOLOGY CLINIC NOTE  Date of Service: .06/10/2016  Patient Care Team: [name], MD as PCP - General (Family Medicine)  CHIEF COMPLAINTS/PURPOSE OF CONSULTATION:  Iron deficiency anemia due to GI bleeding from multiple AVMs  DIAGNOSIS: Iron deficiency anemia likely related to ongoing chronic GI bleeding from multiple AVMs  TREATMENT _IV iron replacement with injectafer as needed (tolerated well). (Had some shortness of breath with IV Feraheme - which was likely fluid overload as opposed to a true allergic reaction but patient has been hesitant to take this)  HISTORY OF PRESENTING ILLNESS:   Connie [name] is a wonderful 82 y.o. female who I had seen in the hospital on 3/3/2017when she presented with severe some dramatic anemia requiring blood transfusions.  Patient has a history of hypertension, hypothyroidism, diabetes and has had issues with recurrent gastrointestinal bleeds related to both upper and lower GI tract AVMs which have been ablated multiple times in the past.  She has also had issues with gastric erosions.  She reports that on 2 occasions of bleeding has been severe enough to require blood transfusions. Her hemoglobin was noted to be 5.4 with an MCV of 79.9 on her presentation.  She was noted to have been on aspirin for primary prophylaxis. Her fecal occult blood testing was noted to be negative.  Hemolytic markers were negative. Ferritin was noted to be 34 with a saturation of 18%. Patient received 4 units of PRBCs.  Her aspirin was held.she was given IV feraheme .   Patient returns for followup clinic today. Her hemoglobin on discharge was 10.6 with an MCV of 84.4.  Her hemoglobin is down to 8.8 today with an MCV of 86.6.RDW is increasing.  Ferritin level was noted to be 18. Informed consent was obtained to set her up for IV iron in the clinic. Patient noted no overt GI bleeding.  INTERVAL HISTORY  Connie [name] is here for followup for  her IDA. She notes no acute new symptoms since her last visit couple of months ago. Energy levels have been good. She and her husband have spent a fair amount of time at the beach. All hemoglobin has progressively improved from 7.7, 3 months ago now up to 10.4. She notes no overt clinically noticeable GI bleeding.   MEDICAL HISTORY:  Past Medical History:  Diagnosis Date  . Allergy    RHINITIS  . Angiodysplasia of duodenum    hx/notes 11/21/2015  . Angiodysplasia of stomach    hx/notes 11/21/2015  . Arthritis    "joints ache"  . Bleeding gastric ulcer    hx/notes 11/21/2015  . Chronic blood loss anemia    secondary to angiodysplasia of the stomach and duodenum as well as history of bleeding gastric ulcer hx/notes 11/21/2015  . History of blood transfusion 01/2015; 06/2015; 11/21/2015  . Hypercholesteremia   . Hypertension   . Hypothyroidism   . Obesity   . Pneumonia "several times"  . Type II diabetes mellitus ([place])     SURGICAL HISTORY: Past Surgical History:  Procedure Laterality Date  . CARDIAC CATHETERIZATION N/A 12/19/2015   Procedure: Left Heart Cath and Coronary Angiography;  Surgeon: Leonie Man, MD;  Location: [place] [facility];  Service: Cardiovascular;  Laterality: N/A;  . COLONOSCOPY N/A 08/30/2014   Procedure: COLONOSCOPY;  Surgeon: Beryle Beams, MD;  Location: [facility];  Service: Endoscopy;  Laterality: N/A;  . COLONOSCOPY N/A 02/21/2015   Procedure: COLONOSCOPY;  Surgeon: Carol Ada, MD;  Location:  [facility];  Service: Endoscopy;  Laterality: N/A;  . ENTEROSCOPY N/A 06/06/2015   Procedure: ENTEROSCOPY;  Surgeon: Carol Ada, MD;  Location: [facility];  Service: Endoscopy;  Laterality: N/A;  . FRACTURE SURGERY    . GIVENS CAPSULE STUDY N/A 05/15/2015   Procedure: GIVENS CAPSULE STUDY;  Surgeon: Carol Ada, MD;  Location: [facility] ENDOSCOPY;  Service: Endoscopy;  Laterality: N/A;  . HOT HEMOSTASIS N/A 08/30/2014   Procedure: HOT HEMOSTASIS (ARGON PLASMA  COAGULATION/BICAP);  Surgeon: Beryle Beams, MD;  Location: [facility];  Service: Endoscopy;  Laterality: N/A;  . HOT HEMOSTASIS N/A 06/06/2015   Procedure: HOT HEMOSTASIS (ARGON PLASMA COAGULATION/BICAP);  Surgeon: Carol Ada, MD;  Location: [facility];  Service: Endoscopy;  Laterality: N/A;  . JOINT REPLACEMENT    . REVISION TOTAL KNEE ARTHROPLASTY Bilateral 2004-2015  . SHOULDER OPEN ROTATOR CUFF REPAIR Right 12/2004   open subacromial decompression, distal clavicle  resection, rotator cuff repair/notes 02/02/2011  . TONSILLECTOMY  1940s  . TOTAL KNEE ARTHROPLASTY Bilateral ~ 2002  . TOTAL THYROIDECTOMY  ~ 1966    SOCIAL HISTORY: Social History   Social History  . Marital status: Married    Spouse name: N/A  . Number of children: N/A  . Years of education: N/A   Occupational History  . Not on file.   Social History Main Topics  . Smoking status: Never Smoker  . Smokeless tobacco: Never Used  . Alcohol use No  . Drug use: No  . Sexual activity: Not Currently   Other Topics Concern  . Not on file   Social History Narrative  . No narrative on file    FAMILY HISTORY: Family History  Problem Relation Age of Onset  . Asthma Mother   . Ulcers Father   . Anemia Sister     ALLERGIES:  has No Known Allergies.  MEDICATIONS:  Current Outpatient Prescriptions  Medication Sig Dispense Refill  . amLODipine (NORVASC) 10 MG tablet Take 1 tablet by mouth daily.    Marland Kitchen atorvastatin (LIPITOR) 40 MG tablet TAKE ONE TABLET BY MOUTH ONCE DAILY 90 tablet 3  . carvedilol (COREG) 6.25 MG tablet Take 1 tablet (6.25 mg total) by mouth 2 (two) times daily with a meal. 180 tablet 3  . furosemide (LASIX) 20 MG tablet Take 1 tablet (20 mg total) by mouth daily. 90 tablet 0  . levothyroxine (SYNTHROID, LEVOTHROID) 125 MCG tablet TAKE 1 TABLET EVERY DAY 90 tablet 3  . Multiple Vitamins-Minerals (PRESERVISION AREDS 2) CAPS Take by mouth.    . Multiple Vitamins-Minerals (WOMENS MULTIVITAMIN  PLUS PO) Take 1 tablet by mouth daily.    . pantoprazole (PROTONIX) 20 MG tablet Take 1 tablet (20 mg total) by mouth daily. 90 tablet 3  . pioglitazone (ACTOS) 30 MG tablet TAKE 1 TABLET EVERY DAY 90 tablet 1  . potassium chloride (K-DUR,KLOR-CON) 10 MEQ tablet Take 1 tablet (10 mEq total) by mouth 2 (two) times daily. 180 tablet 1  . sacubitril-valsartan (ENTRESTO) 49-51 MG Take 1 tablet by mouth 2 (two) times daily. 180 tablet 3  . traZODone (DESYREL) 50 MG tablet Take 1/2 tablet by mouth at night as needed for sleep    . triamcinolone cream (KENALOG) 0.1 % Apply 1 application topically 2 (two) times daily. 30 g 0   No current facility-administered medications for this visit.     REVIEW OF SYSTEMS:    10 Point review of Systems was done is negative except as noted above.  PHYSICAL EXAMINATION: ECOG PERFORMANCE  STATUS: 1 - Symptomatic but completely ambulatory  . Vitals:   06/10/16 1310  BP: (!) 187/50  Pulse: (!) 59  Resp: 18  Temp: 98 F (36.7 C)   Filed Weights   06/10/16 1310  Weight: 132 lb 3.2 oz (60 kg)   .Body mass index is 27.63 kg/m.  GENERAL:alert, in no acute distress and comfortable SKIN: skin color, texture, turgor are normal, no rashes or significant lesions EYES: normal, conjunctiva are pink and non-injected, sclera clear OROPHARYNX:no exudate, no erythema and lips, buccal mucosa, and tongue normal  NECK: supple, no JVD, thyroid normal size, non-tender, without nodularity LYMPH:  no palpable lymphadenopathy in the cervical, axillary or inguinal LUNGS: clear to auscultation with normal respiratory effort HEART: regular rate & rhythm,  no murmurs and no lower extremity edema ABDOMEN: abdomen soft, non-tender, normoactive bowel sounds ,  no palpable hepatosplenomegaly Musculoskeletal: no cyanosis of digits and no clubbing  PSYCH: alert & oriented x 3 with fluent speech NEURO: no focal motor/sensory deficits  LABORATORY DATA:   . CBC Latest Ref Rng &  Units 06/10/2016 04/28/2016 04/02/2016  WBC 3.9 - 10.3 10e3/uL 4.6 4.8 4.8  Hemoglobin 11.6 - 15.9 g/dL 10.4(L) 9.9(L) 8.7(L)  Hematocrit 34.8 - 46.6 % 33.1(L) 31.9(L) 28.4(L)  Platelets 145 - 400 10e3/uL 232 191 287    . CBC    Component Value Date/Time   WBC 4.6 06/10/2016 1219   WBC 7.1 02/23/2016 1027   RBC 3.55 (L) 06/10/2016 1219   RBC 2.76 (L) 02/23/2016 1027   HGB 10.4 (L) 06/10/2016 1219   HCT 33.1 (L) 06/10/2016 1219   PLT 232 06/10/2016 1219   MCV 93.2 06/10/2016 1219   MCH 29.3 06/10/2016 1219   MCH 27.9 02/23/2016 1027   MCHC 31.4 (L) 06/10/2016 1219   MCHC 32.0 02/23/2016 1027   RDW 16.9 (H) 06/10/2016 1219   LYMPHSABS 0.8 (L) 06/10/2016 1219   MONOABS 0.6 06/10/2016 1219   EOSABS 0.4 06/10/2016 1219   BASOSABS 0.0 06/10/2016 1219    . CMP Latest Ref Rng & Units 06/10/2016 04/28/2016 03/19/2016  Glucose 70 - 140 mg/dl 91 105 116  BUN 7.0 - 26.0 mg/dL 23.1 22.6 28.2(H)  Creatinine 0.6 - 1.1 mg/dL 1.1 1.1 1.1  Sodium 136 - 145 mEq/L 144 143 144  Potassium 3.5 - 5.1 mEq/L 3.8 4.3 3.7  Chloride 98 - 110 mmol/L - - -  CO2 22 - 29 mEq/L 25 25 24   Calcium 8.4 - 10.4 mg/dL 8.7 8.2(L) 8.6  Total Protein 6.4 - 8.3 g/dL - 6.2(L) -  Total Bilirubin 0.20 - 1.20 mg/dL - 0.42 -  Alkaline Phos 40 - 150 U/L - 67 -  AST 5 - 34 U/L - 17 -  ALT 0 - 55 U/L - <9 -    Lab Results  Component Value Date   FERRITIN 248 06/10/2016     ASSESSMENT & PLAN:   82 year old Caucasian female with  #1 Microcytic anemia likely related to iron deficiency from chronic GI bleeding. No known disorder causing chronic inflammation at this time. Ferritin was down trending from 34 to 19. Had gone up to 437 after IV injectafer 750mg  weekly x 2 doses. Hemoglobin continues to improve at 10.4. Patient's ferritin level is down trending again at 248. This probably reflects slow ongoing GI losses at this time.  #2 history of recurrent GI bleeding related to upper and lower GI tract AVMs and gastric  erosions. Has had several admissions with requirement of multiple  PRBC transfusions. Dropping ferritin suggests ongoing slow losses. Hemoglobin is relatively stable. No clinically overt GI bleeding noted.  #3 status post acute renal failure in January 2017 currently creatinine within normal limits-  #4 hypothyroidism on levothyroxine .  #5 ?? Reaction to [facility] - current hospital admission that shortness of breath appears to be more likely related to CHF decompensation than allergic reaction to Feraheme. However patient is very anxious and hesitant to get this preparation.  Plan  -patient is currently off ASA to try to reduce her risk of GI bleeding though she continues to remain at high-risk for GI bleeding given her extensive AVMs. -The central treatment is GI follow-up to ablate AVMs and 2 was possible to control her GI bleeding. Which she should continue. -Blood pressure somewhat elevated today. She needs to continue follow-up with her primary care physician for good blood pressure control since elevated blood pressures will probably increase her risk of GI bleeding from her AVMs as well. -No indication for IV iron replacement at this time. -we will continue to f/u for  chronic blood loss anemia which would be treated with aggressive IV iron replacement to maintain ferritin levels of >100. -continue daily multivitamin orally. -Continue followup with GI for further evaluation and management of AVMs as needed. -Continue close followup with primary care physician for management of other medical issues. (HTN control)   Return to care with Dr. [name] in 8 weeks with repeat CBC, CMP and ferritin to reassess status of anemia.  All of the patients questions were answered with apparent satisfaction. The patient knows to call the clinic with any problems, questions or concerns.  I spent 20 minutes counseling the patient face to face. The total time spent in the appointment was 20 minutes and more  than 50% was on counseling and direct patient cares.    Sullivan Lone MD [place] AAHIVMS [facility] Hematology/Oncology Physician [facility]  (Office):       [phone] (Work cell):  [phone] (Fax):           [phone]

## 2016-06-21 ENCOUNTER — Telehealth: Payer: Self-pay

## 2016-06-21 NOTE — Telephone Encounter (Signed)
Pt request refills of Furosemide and potassium to [place] please advice if refills appropriate. Thank you, RLB

## 2016-06-21 NOTE — Telephone Encounter (Signed)
Okay 

## 2016-06-22 ENCOUNTER — Other Ambulatory Visit: Payer: Self-pay

## 2016-06-22 DIAGNOSIS — R609 Edema, unspecified: Secondary | ICD-10-CM

## 2016-06-22 MED ORDER — POTASSIUM CHLORIDE CRYS ER 10 MEQ PO TBCR: 10.0000 meq | EXTENDED_RELEASE_TABLET | Freq: Two times a day (BID) | ORAL | 1 refills | Status: DC

## 2016-06-22 MED ORDER — FUROSEMIDE 20 MG PO TABS: 20.0000 mg | ORAL_TABLET | Freq: Every day | ORAL | 0 refills | Status: DC

## 2016-06-22 NOTE — Telephone Encounter (Signed)
Both meds were sent

## 2016-06-25 ENCOUNTER — Telehealth: Payer: Self-pay | Admitting: Family Medicine

## 2016-06-25 DIAGNOSIS — Z01818 Encounter for other preprocedural examination: Secondary | ICD-10-CM | POA: Diagnosis not present

## 2016-06-25 DIAGNOSIS — H2511 Age-related nuclear cataract, right eye: Secondary | ICD-10-CM | POA: Diagnosis not present

## 2016-06-25 NOTE — Telephone Encounter (Signed)
Pt called and states that she needs her referral exteneded to the end of the year according to the [place] [facility], she may need some more shots in her eye, pt can be reached at [phone] with any questions

## 2016-06-26 NOTE — Telephone Encounter (Signed)
Ok

## 2016-06-28 ENCOUNTER — Other Ambulatory Visit: Payer: Self-pay

## 2016-06-28 NOTE — Patient Outreach (Signed)
[facility]) Care Management  06/28/2016  Breyonna Sorrells [place] Feb 07, 1934 PI:[id number]   REFERRAL DATE; 06/18/16  REFERRAL SOURCE: Tier list referral REFERRAL REASON: high risk list  Telephone call to patient regarding high risk list.  Unable to reach phone.  Phone only rang, no voice mail pick up.   PLAN; RNCM will attempt 2nd telephone call to patient within 1 week.  Quinn Plowman RN,BSN,CCM [facility] Telephonic  [phone]

## 2016-06-29 NOTE — Telephone Encounter (Signed)
Order has been put in for [facility]

## 2016-07-01 DIAGNOSIS — H353211 Exudative age-related macular degeneration, right eye, with active choroidal neovascularization: Secondary | ICD-10-CM | POA: Diagnosis not present

## 2016-07-05 ENCOUNTER — Other Ambulatory Visit: Payer: Self-pay

## 2016-07-05 NOTE — Patient Outreach (Signed)
[facility]) Care Management  07/05/2016  Connie [name] [birth year] :[id number]   REFERRAL DATE: 06/18/16  REFERRAL SOURCE: [employer] tier 4 REFERRAL REASON:  High risk tier 4 telephone outreach  Second telephone call to patient.  Unable to reach or leave voice message.  Phone only rang.  PLAN: RNCM will attempt 3rd telephone call to patient within 1 week.   Quinn Plowman RN,BSN,CCM [facility] Telephonic  [phone]

## 2016-07-06 ENCOUNTER — Encounter: Payer: Self-pay | Admitting: Physician Assistant

## 2016-07-06 ENCOUNTER — Other Ambulatory Visit: Payer: Self-pay

## 2016-07-06 NOTE — Patient Outreach (Signed)
[place] [facility]) Care Management  07/06/2016  Rein Reining [place] 01/06/1934 [place]:[id number]   REFERRAL DATE; 06/18/16     REFERRAL SOURCE: Tier list referral REFERRAL REASON: high risk list  Telephone call to patient.  HIPAA verified with patient.  Discussed and offered [facility] care management services.  Patient refused services stating she feels she is ok at this time. Patient verbally agreed to received [facility] care management outreach letter/ brochure.   PLAN:  RNCM will refer patient to care management assistant to close due to refusal of services. RNCM will notify patients primary MD of closure RNCM will send patient outreach letter/ brochure as discussed.   Quinn Plowman RN,BSN,CCM [facility] Telephonic  [phone]

## 2016-07-13 DIAGNOSIS — I509 Heart failure, unspecified: Secondary | ICD-10-CM | POA: Diagnosis not present

## 2016-07-13 DIAGNOSIS — Z7984 Long term (current) use of oral hypoglycemic drugs: Secondary | ICD-10-CM | POA: Diagnosis not present

## 2016-07-13 DIAGNOSIS — Z79899 Other long term (current) drug therapy: Secondary | ICD-10-CM | POA: Diagnosis not present

## 2016-07-13 DIAGNOSIS — I110 Hypertensive heart disease with heart failure: Secondary | ICD-10-CM | POA: Diagnosis not present

## 2016-07-13 DIAGNOSIS — Z961 Presence of intraocular lens: Secondary | ICD-10-CM | POA: Diagnosis not present

## 2016-07-13 DIAGNOSIS — H2512 Age-related nuclear cataract, left eye: Secondary | ICD-10-CM | POA: Diagnosis not present

## 2016-07-13 DIAGNOSIS — I10 Essential (primary) hypertension: Secondary | ICD-10-CM | POA: Diagnosis not present

## 2016-07-13 DIAGNOSIS — E119 Type 2 diabetes mellitus without complications: Secondary | ICD-10-CM | POA: Diagnosis not present

## 2016-07-13 DIAGNOSIS — E039 Hypothyroidism, unspecified: Secondary | ICD-10-CM | POA: Diagnosis not present

## 2016-07-13 DIAGNOSIS — K219 Gastro-esophageal reflux disease without esophagitis: Secondary | ICD-10-CM | POA: Diagnosis not present

## 2016-07-13 DIAGNOSIS — E785 Hyperlipidemia, unspecified: Secondary | ICD-10-CM | POA: Diagnosis not present

## 2016-07-19 ENCOUNTER — Ambulatory Visit: Payer: Commercial Managed Care - HMO | Admitting: Physician Assistant

## 2016-08-03 ENCOUNTER — Ambulatory Visit (INDEPENDENT_AMBULATORY_CARE_PROVIDER_SITE_OTHER): Payer: Commercial Managed Care - HMO | Admitting: Physician Assistant

## 2016-08-03 ENCOUNTER — Encounter: Payer: Self-pay | Admitting: Physician Assistant

## 2016-08-03 ENCOUNTER — Telehealth: Payer: Self-pay | Admitting: *Deleted

## 2016-08-03 VITALS — BP 120/52 | HR 63 | Ht <= 58 in | Wt 123.6 lb

## 2016-08-03 DIAGNOSIS — E7800 Pure hypercholesterolemia, unspecified: Secondary | ICD-10-CM

## 2016-08-03 DIAGNOSIS — I5022 Chronic systolic (congestive) heart failure: Secondary | ICD-10-CM

## 2016-08-03 DIAGNOSIS — Z79899 Other long term (current) drug therapy: Secondary | ICD-10-CM

## 2016-08-03 DIAGNOSIS — I10 Essential (primary) hypertension: Secondary | ICD-10-CM

## 2016-08-03 DIAGNOSIS — E1159 Type 2 diabetes mellitus with other circulatory complications: Secondary | ICD-10-CM

## 2016-08-03 DIAGNOSIS — I2510 Atherosclerotic heart disease of native coronary artery without angina pectoris: Secondary | ICD-10-CM

## 2016-08-03 DIAGNOSIS — D500 Iron deficiency anemia secondary to blood loss (chronic): Secondary | ICD-10-CM | POA: Diagnosis not present

## 2016-08-03 DIAGNOSIS — I428 Other cardiomyopathies: Secondary | ICD-10-CM | POA: Diagnosis not present

## 2016-08-03 LAB — CBC WITH DIFFERENTIAL/PLATELET
BASOS ABS: 42 {cells}/uL (ref 0–200)
BASOS PCT: 1 %
EOS ABS: 168 {cells}/uL (ref 15–500)
Eosinophils Relative: 4 %
HEMATOCRIT: 31.8 % — AB (ref 35.0–45.0)
Hemoglobin: 10.3 g/dL — ABNORMAL LOW (ref 11.7–15.5)
LYMPHS PCT: 13 %
Lymphs Abs: 546 cells/uL — ABNORMAL LOW (ref 850–3900)
MCH: 30 pg (ref 27.0–33.0)
MCHC: 32.4 g/dL (ref 32.0–36.0)
MCV: 92.7 fL (ref 80.0–100.0)
MONO ABS: 588 {cells}/uL (ref 200–950)
MPV: 10.5 fL (ref 7.5–12.5)
Monocytes Relative: 14 %
NEUTROS PCT: 68 %
Neutro Abs: 2856 cells/uL (ref 1500–7800)
Platelets: 253 10*3/uL (ref 140–400)
RBC: 3.43 MIL/uL — ABNORMAL LOW (ref 3.80–5.10)
RDW: 13.7 % (ref 11.0–15.0)
WBC: 4.2 10*3/uL (ref 3.8–10.8)

## 2016-08-03 LAB — BASIC METABOLIC PANEL
BUN: 34 mg/dL — AB (ref 7–25)
CHLORIDE: 108 mmol/L (ref 98–110)
CO2: 25 mmol/L (ref 20–31)
Calcium: 9 mg/dL (ref 8.6–10.4)
Creat: 1.16 mg/dL — ABNORMAL HIGH (ref 0.60–0.88)
GLUCOSE: 112 mg/dL — AB (ref 65–99)
POTASSIUM: 4.6 mmol/L (ref 3.5–5.3)
Sodium: 143 mmol/L (ref 135–146)

## 2016-08-03 NOTE — Progress Notes (Signed)
Cardiology Office Note:    Date:  08/03/2016   ID:  Connie [name], DOB 1934/03/01, MRN [id number]  PCP:  Connie Haste, MD  Cardiologist:  Dr. Candee [name]   Electrophysiologist:  N/a Heme/Onc: Dr. Fabienne [name] GI: Dr. Benson [name] [facility]) / Dr. Olegario [name] [facility])  Referring MD: Connie Lung, MD   Chief Complaint  Patient presents with  . Follow-up    CHF    History of Present Illness:    Connie [name] is a 82 y.o. female with a hx of systolic HF 2/2 NICM, chronic anemia in the setting of gastric AVMs, DM, hypothyroidism.  She was admitted in 3/17 with acute systolic CHF and minimally elevated Troponin levels (demand ischemia).  EF was 35-40 on Echo and LHC demonstrated non-obs CAD.  She is not on antiplatelet Rx 2/2 chronic GI blood loss.  She was last seen by Dr. Candee [name] in 6/17.    She returns for FU.  She is here with her daughter.  Unfortunately, her husband was just dx with metastatic CA.  He is currently in the hospital with hyperkalemia.  He is followed by Dr. Inda [name] and is to see Dr. Lisbeth [name] soon for radiation.  Connie [name] is under a great deal of stress and has not slept much. She is doing well from a cardiac standpoint.  She denies chest pain, dyspnea on exertion, orthopnea, PND, edema, syncope.   Prior CV studies that were reviewed today include:    LHC 12/19/15 LM ostial 40 LAD normal LCx normal RCA ostial 20  Echo 12/17/15 EF 35-40, diffuse HK, trivial AI, mild MR, PASP 37, small posterior pericardial effusion   Past Medical History:  Diagnosis Date  . Allergy    RHINITIS  . Angiodysplasia of duodenum    hx/notes 11/21/2015  . Angiodysplasia of stomach    hx/notes 11/21/2015  . Arthritis    "joints ache"  . Bleeding gastric ulcer    hx/notes 11/21/2015  . Chronic blood loss anemia    secondary to angiodysplasia of the stomach and duodenum as well as history of bleeding gastric ulcer hx/notes 11/21/2015  . History of blood transfusion  01/2015; 06/2015; 11/21/2015  . Hypercholesteremia   . Hypertension   . Hypothyroidism   . Obesity   . Pneumonia "several times"  . Type II diabetes mellitus ([place])     Past Surgical History:  Procedure Laterality Date  . CARDIAC CATHETERIZATION N/A 12/19/2015   Procedure: Left Heart Cath and Coronary Angiography;  Surgeon: Connie Man, MD;  Location: [facility];  Service: Cardiovascular;  Laterality: N/A;  . COLONOSCOPY N/A 08/30/2014   Procedure: COLONOSCOPY;  Surgeon: Connie Beams, MD;  Location: [facility]COPY;  Service: Endoscopy;  Laterality: N/A;  . COLONOSCOPY N/A 02/21/2015   Procedure: COLONOSCOPY;  Surgeon: Connie Ada, MD;  Location: [facility] ENDOSCOPY;  Service: Endoscopy;  Laterality: N/A;  . ENTEROSCOPY N/A 06/06/2015   Procedure: ENTEROSCOPY;  Surgeon: Connie Ada, MD;  Location: [facility];  Service: Endoscopy;  Laterality: N/A;  . FRACTURE SURGERY    . GIVENS CAPSULE STUDY N/A 05/15/2015   Procedure: GIVENS CAPSULE STUDY;  Surgeon: Connie Ada, MD;  Location: [facility] ENDOSCOPY;  Service: Endoscopy;  Laterality: N/A;  . HOT HEMOSTASIS N/A 08/30/2014   Procedure: HOT HEMOSTASIS (ARGON PLASMA COAGULATION/BICAP);  Surgeon: Connie Beams, MD;  Location: [facility];  Service: Endoscopy;  Laterality: N/A;  . HOT HEMOSTASIS N/A 06/06/2015   Procedure: HOT HEMOSTASIS (ARGON PLASMA COAGULATION/BICAP);  Surgeon:  Connie Ada, MD;  Location: [facility];  Service: Endoscopy;  Laterality: N/A;  . JOINT REPLACEMENT    . REVISION TOTAL KNEE ARTHROPLASTY Bilateral 2004-2015  . SHOULDER OPEN ROTATOR CUFF REPAIR Right 12/2004   open subacromial decompression, distal clavicle  resection, rotator cuff repair/notes 02/02/2011  . TONSILLECTOMY  1940s  . TOTAL KNEE ARTHROPLASTY Bilateral ~ 2002  . TOTAL THYROIDECTOMY  ~ 1966    Current Medications: Current Meds  Medication Sig  . amLODipine (NORVASC) 10 MG tablet Take 1 tablet by mouth daily.  Marland Kitchen atorvastatin (LIPITOR) 40 MG tablet  TAKE ONE TABLET BY MOUTH ONCE DAILY  . carvedilol (COREG) 6.25 MG tablet Take 1 tablet (6.25 mg total) by mouth 2 (two) times daily with a meal.  . furosemide (LASIX) 20 MG tablet Take 1 tablet (20 mg total) by mouth daily.  Marland Kitchen levothyroxine (SYNTHROID, LEVOTHROID) 125 MCG tablet TAKE 1 TABLET EVERY DAY  . Multiple Vitamins-Minerals (PRESERVISION AREDS 2) CAPS Take 1 tablet by mouth 2 (two) times daily.   . Multiple Vitamins-Minerals (WOMENS MULTIVITAMIN PLUS PO) Take 1 tablet by mouth daily.  . pantoprazole (PROTONIX) 20 MG tablet Take 1 tablet (20 mg total) by mouth daily.  . pioglitazone (ACTOS) 30 MG tablet TAKE 1 TABLET EVERY DAY  . potassium chloride (K-DUR,KLOR-CON) 10 MEQ tablet Take 1 tablet (10 mEq total) by mouth 2 (two) times daily.  . sacubitril-valsartan (ENTRESTO) 49-51 MG Take 1 tablet by mouth 2 (two) times daily.  . traZODone (DESYREL) 50 MG tablet Take 1/2 tablet by mouth at night as needed for sleep     Allergies:   Patient has no known allergies.   Social History   Social History  . Marital status: Married    Spouse name: N/A  . Number of children: N/A  . Years of education: N/A   Social History Main Topics  . Smoking status: Never Smoker  . Smokeless tobacco: Never Used  . Alcohol use No  . Drug use: No  . Sexual activity: Not Currently   Other Topics Concern  . None   Social History Narrative  . None     Family History:  The patient's family history includes Anemia in her sister; Asthma in her mother; Ulcers in her father.   ROS:   Please see the history of present illness.    ROS All other systems reviewed and are negative.   EKGs/Labs/Other Test Reviewed:    EKG:  EKG is  ordered today.  The ekg ordered today demonstrates NSR, HR 63, normal axis, QTc 421 ms  Recent Labs: 12/20/2015: B Natriuretic Peptide 303.0; Magnesium 2.0 04/28/2016: ALT <9 05/12/2016: TSH 1.71 06/10/2016: BUN 23.1; Creatinine 1.1; HGB 10.4; Platelets 232; Potassium 3.8; Sodium  144   Recent Lipid Panel    Component Value Date/Time   CHOL 137 05/12/2016 1000   TRIG 119 05/12/2016 1000   HDL 47 05/12/2016 1000   CHOLHDL 2.9 05/12/2016 1000   VLDL 24 05/12/2016 1000   LDLCALC 66 05/12/2016 1000     Physical Exam:    VS:  BP (!) 120/52   Pulse 63   Ht 4\' 9"  (1.448 m)   Wt 123 lb 9.6 oz (56.1 kg)   BMI 26.75 kg/m     Wt Readings from Last 3 Encounters:  08/03/16 123 lb 9.6 oz (56.1 kg)  06/10/16 132 lb 3.2 oz (60 kg)  05/12/16 132 lb (59.9 kg)     Physical Exam  Constitutional: She is oriented to  person, place, and time. She appears well-developed and well-nourished. No distress.  HENT:  Head: Normocephalic and atraumatic.  Eyes: No scleral icterus.  Neck: No JVD present.  Cardiovascular: Normal rate, regular rhythm and normal heart sounds.   No murmur heard. Pulmonary/Chest: Effort normal. She has no wheezes. She has no rales.  Abdominal: Soft. There is no tenderness.  Musculoskeletal: She exhibits edema.  Trace L>R LE edema  Neurological: She is alert and oriented to person, place, and time.  Skin: Skin is warm and dry.  Psychiatric: She has a normal mood and affect.    ASSESSMENT:    1. Chronic systolic CHF (congestive heart failure) ([place])   2. NICM (nonischemic cardiomyopathy) ([place])   3. Coronary artery disease involving native coronary artery of native heart without angina pectoris   4. Hypertension associated with diabetes ([place])   5. Iron deficiency anemia due to chronic blood loss   6. Pure hypercholesterolemia    PLAN:    In order of problems listed above:  1. Chronic systolic CHF - Volume stable.  She is NYHA 2.  Continue beta-blocker, ARNI, Lasix.  Check BMET today.  2. Nonischemic cardiomyopathy - Cardiac catheterization with mild nonobstructive CAD in 3/17. EF was >35%.  Will plan FU echocardiogram in 11/2016 to recheck EF.  3. CAD - She had nonobstructive CAD at cardiac catheterization. She is not on anti-platelet  therapy secondary to history of chronic GI blood loss from AVMs. Continue statin.  4. HTN - BP is controlled.  5. Chronic anemia - She has gastric AVMs and required transfusion with PRBCs x 4 in 3/17.  Most recent Hgb 10.4 in 9/17.  She is followed by hematology and GI.  She would like her CBC checked today.  Get CBC with BMET today.    6. HL - LDL was 66 in 8/17.  Continue Atorva 40.     Medication Adjustments/Labs and Tests Ordered: Current medicines are reviewed at length with the patient today.  Concerns regarding medicines are outlined above.  Medication changes, Labs and Tests ordered today are outlined in the Patient Instructions noted below. Patient Instructions  Medication Instructions:  Your physician recommends that you continue on your current medications as directed. Please refer to the Current Medication list given to you today.  Labwork: TODAY BMET, CBC W/DIFF  Testing/Procedures: Your physician has requested that you have an echocardiogram. Echocardiography is a painless test that uses sound waves to create images of your heart. It provides your doctor with information about the size and shape of your heart and how well your heart's chambers and valves are working. This procedure takes approximately one hour. There are no restrictions for this procedure. THIS IS TO BE DONE MARCH 2018 OR AFTER  Follow-Up: DR. Marlou Porch MARCH 2018 OR April AFTER ECHO HAS BEEN DONE  Any Other Special Instructions Will Be Listed Below (If Applicable).  If you need a refill on your cardiac medications before your next appointment, please call your pharmacy.   Signed, [name], PA-C  08/03/2016 12:39 PM    [facility] [address], [place], [place]  [address] Phone: [phone]; Fax: [phone]

## 2016-08-03 NOTE — Patient Instructions (Addendum)
Medication Instructions:  Your physician recommends that you continue on your current medications as directed. Please refer to the Current Medication list given to you today.  Labwork: TODAY BMET, CBC W/DIFF  Testing/Procedures: Your physician has requested that you have an echocardiogram. Echocardiography is a painless test that uses sound waves to create images of your heart. It provides your doctor with information about the size and shape of your heart and how well your heart's chambers and valves are working. This procedure takes approximately one hour. There are no restrictions for this procedure. THIS IS TO BE DONE MARCH 2018 OR AFTER  Follow-Up: DR. Marlou Porch MARCH 2018 OR April AFTER ECHO HAS BEEN DONE  Any Other Special Instructions Will Be Listed Below (If Applicable).  If you need a refill on your cardiac medications before your next appointment, please call your pharmacy.

## 2016-08-03 NOTE — Telephone Encounter (Signed)
Lmtcb to go over lab results and recommendations.  

## 2016-08-04 ENCOUNTER — Encounter: Payer: Self-pay | Admitting: *Deleted

## 2016-08-04 DIAGNOSIS — Z79899 Other long term (current) drug therapy: Secondary | ICD-10-CM | POA: Insufficient documentation

## 2016-08-04 MED ORDER — POTASSIUM CHLORIDE ER 10 MEQ PO TBCR: 10.0000 meq | EXTENDED_RELEASE_TABLET | ORAL | Status: DC

## 2016-08-04 MED ORDER — FUROSEMIDE 20 MG PO TABS: 20.0000 mg | ORAL_TABLET | ORAL | Status: DC

## 2016-08-04 NOTE — Addendum Note (Signed)
Addended by: Michae Kava on: 08/04/2016 02:18 PM   Modules accepted: Orders

## 2016-08-04 NOTE — Telephone Encounter (Signed)
Pt is returning call to get lab results, have questions about switching over medication

## 2016-08-04 NOTE — Telephone Encounter (Signed)
From result note: Please call Connie [name] with the results of her lab work: Renal function (creatinine) stable BUN is elevated - she may be somewhat over-diuresed The potassium is normal Glucose is high The hemoglobin is stable without significant change since 9/17. All other parameters are within acceptable limits and no further intervention or testing required  Decrease Lasix and K+ to every Monday, Wednesday, Friday only. Weigh daily and take extra Lasix dose if weight increases 3 lbs or more in 1 day or if LE edema worsens. BMET 2 weeks. [name], PA-C   Pt notified of above results and states understanding of new medication directions and will have BMET redrawn in 2weeks.

## 2016-08-04 NOTE — Telephone Encounter (Signed)
Pt notified of above results and states understanding of new medication directions and will have BMET redrawn in 2weeks. Waylan Rocher, LPN  Julaine Hua, CMA 2:11 pm I called pt back since I did not see a date for her lab work to be done and did not see her medication list reflected of the med changes with her lasix and K+. I first apologized to the pt for a 2nd call today. Pt said that was fine. When I asked the pt to tell me what medications was she told to change, she stated her [name] and was told to start taking Entresto every Mon, Wed and Fri's. I advised pt that was not the correct information given to her. I advised pt she is to continue on the Steen just like she is taking now. Pt advised the medication change is with her lasix and K+ and that we were decreasing down to take both of these medications every Mon, Wed and Fri's with repeat lab work in 2 weeks (bmet). Pt said she was glad that I called back because she had the wrong information given to her from the other nurse earlier. Pt did state she will call back to schedule BMET for 2 weeks. She is at the hospital right now with her husband and his Oncologist discussing when her husband is going to have his surgery. I said that was fine she can call back and to take care of her husband right now. Pt said thank you for understanding.

## 2016-08-04 NOTE — Addendum Note (Signed)
Addended by: Michae Kava on: 08/04/2016 02:23 PM   Modules accepted: Orders

## 2016-08-05 ENCOUNTER — Other Ambulatory Visit: Payer: Commercial Managed Care - HMO

## 2016-08-05 ENCOUNTER — Ambulatory Visit: Payer: Commercial Managed Care - HMO | Admitting: Hematology

## 2016-08-17 ENCOUNTER — Telehealth: Payer: Self-pay | Admitting: Family Medicine

## 2016-08-17 NOTE — Telephone Encounter (Signed)
Pt was called to see how she was doing and if she needed anything. She stated the she is just existing. She states that everything is confusing and she is relying on her son to help her. She states that she recently had her blood checked and everything was ok. She stated that they knew JCL was PCP so results are in her chart. Pt was informed that if she needed anything to call the office. Pt was reminded of her appt in Dec.  She stated to keep it on the scheduled at this point.

## 2016-08-19 DIAGNOSIS — H353211 Exudative age-related macular degeneration, right eye, with active choroidal neovascularization: Secondary | ICD-10-CM | POA: Diagnosis not present

## 2016-09-09 IMAGING — CR DG CHEST 1V PORT
1 series · 1 of 1 positions shown · non-contrast
Comparison: Chest radiograph performed 12/23/2004

CLINICAL DATA: Acute onset of weakness.  Initial encounter.

EXAM:
PORTABLE CHEST - 1 VIEW

[AP]
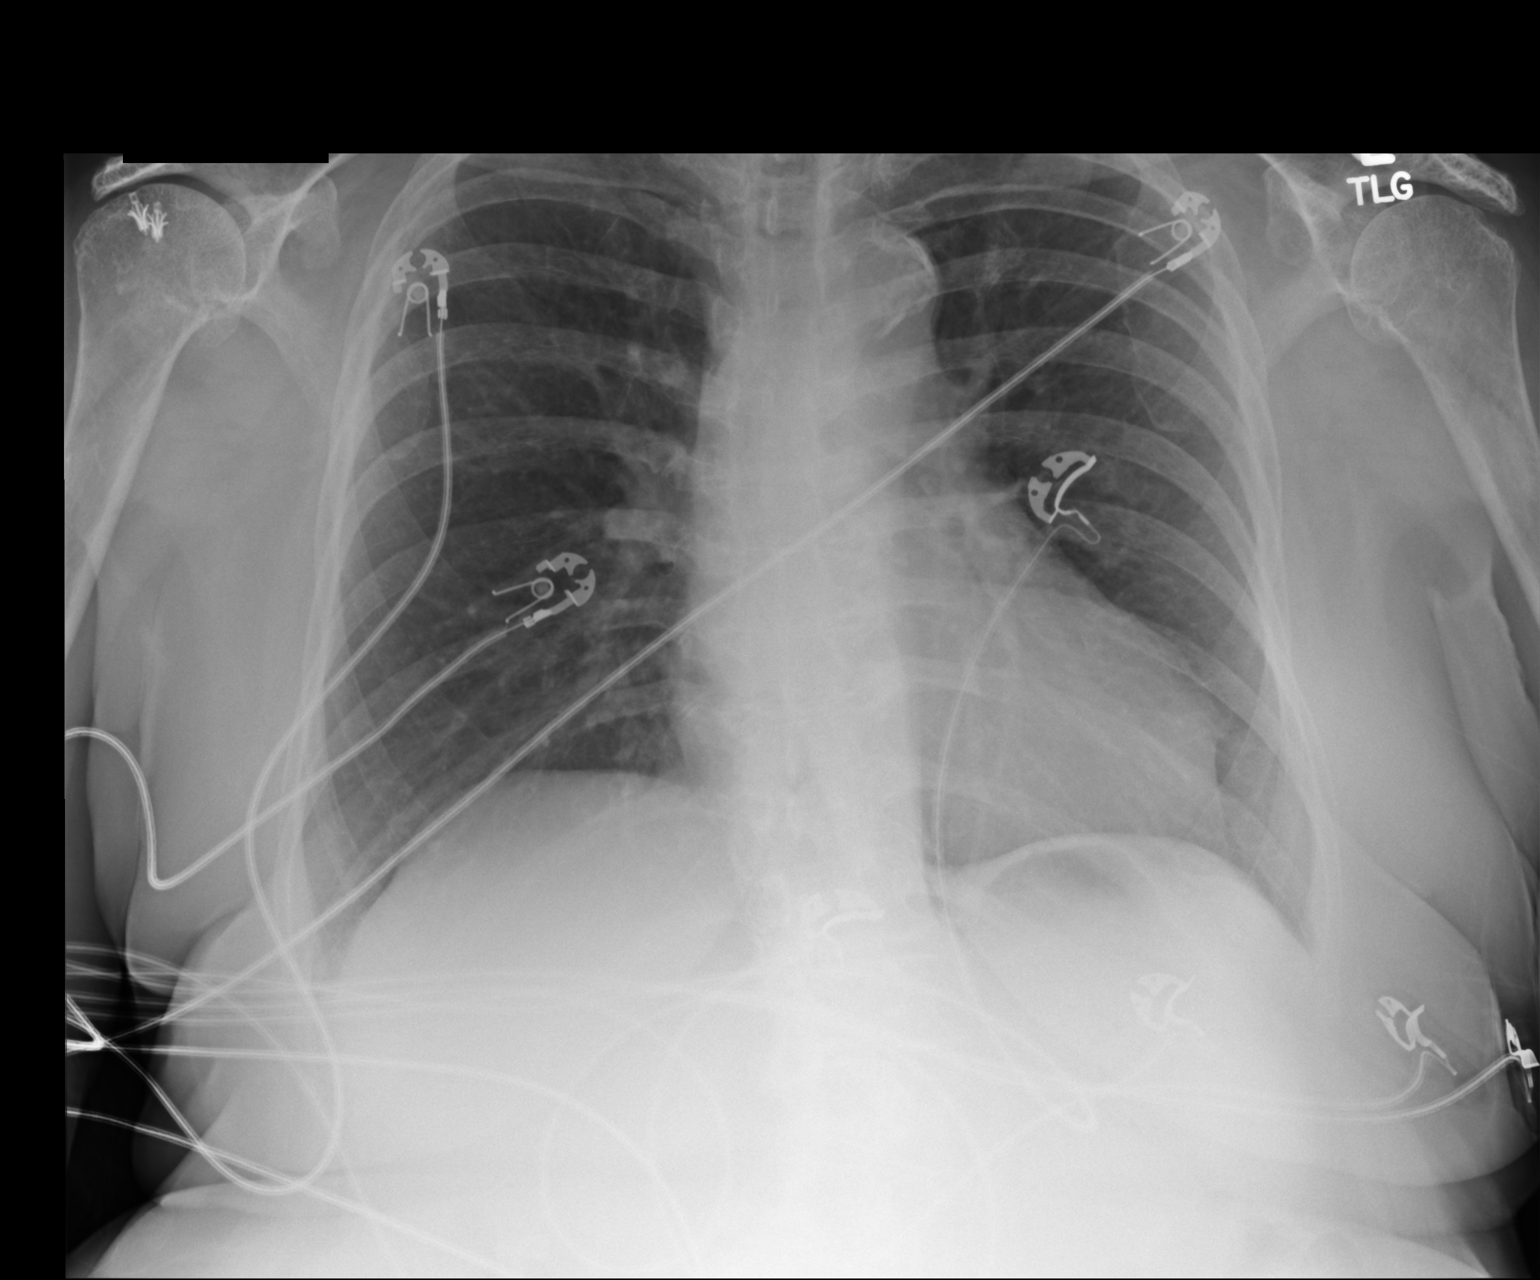

[1 of 1 positions shown; findings below may reference images not displayed]

FINDINGS: The lungs are well-aerated and clear. There is no evidence of focal
opacification, pleural effusion or pneumothorax.

The cardiomediastinal silhouette is within normal limits. No acute
osseous abnormalities are seen. The patient is status post
right-sided rotator cuff repair.
IMPRESSION: No acute cardiopulmonary process seen.

## 2016-09-16 ENCOUNTER — Ambulatory Visit: Payer: Commercial Managed Care - HMO | Admitting: Family Medicine

## 2016-09-22 ENCOUNTER — Other Ambulatory Visit: Payer: Self-pay | Admitting: Family Medicine

## 2016-09-22 ENCOUNTER — Telehealth: Payer: Self-pay | Admitting: Family Medicine

## 2016-09-22 DIAGNOSIS — E1169 Type 2 diabetes mellitus with other specified complication: Secondary | ICD-10-CM

## 2016-09-22 DIAGNOSIS — E785 Hyperlipidemia, unspecified: Principal | ICD-10-CM

## 2016-09-22 DIAGNOSIS — I5022 Chronic systolic (congestive) heart failure: Secondary | ICD-10-CM

## 2016-09-22 MED ORDER — FUROSEMIDE 20 MG PO TABS: 20.0000 mg | ORAL_TABLET | ORAL | Status: DC

## 2016-09-22 MED ORDER — PIOGLITAZONE HCL 30 MG PO TABS: 30.0000 mg | ORAL_TABLET | Freq: Every day | ORAL | 1 refills | Status: DC

## 2016-09-22 MED ORDER — ATORVASTATIN CALCIUM 40 MG PO TABS: ORAL_TABLET | ORAL | 3 refills | Status: DC

## 2016-09-22 NOTE — Telephone Encounter (Signed)
Pt called and states her pharmacy wants rx called in for Atorvastatin, Pioglitazone and Furosemide for [facility] mail order.  Pt has ov 10/19/16

## 2016-09-22 NOTE — Telephone Encounter (Signed)
Ok to refill furosemide 

## 2016-09-29 ENCOUNTER — Encounter: Payer: Self-pay | Admitting: Family Medicine

## 2016-09-29 ENCOUNTER — Ambulatory Visit (INDEPENDENT_AMBULATORY_CARE_PROVIDER_SITE_OTHER): Payer: Commercial Managed Care - HMO | Admitting: Family Medicine

## 2016-09-29 VITALS — BP 150/80 | HR 85 | Temp 98.2°F | Wt 131.0 lb

## 2016-09-29 DIAGNOSIS — J019 Acute sinusitis, unspecified: Secondary | ICD-10-CM | POA: Diagnosis not present

## 2016-09-29 DIAGNOSIS — Z7189 Other specified counseling: Secondary | ICD-10-CM | POA: Diagnosis not present

## 2016-09-29 DIAGNOSIS — J209 Acute bronchitis, unspecified: Secondary | ICD-10-CM

## 2016-09-29 MED ORDER — AMOXICILLIN-POT CLAVULANATE 875-125 MG PO TABS: 1.0000 | ORAL_TABLET | Freq: Two times a day (BID) | ORAL | 0 refills | Status: DC

## 2016-09-29 NOTE — Patient Instructions (Signed)
Take it all and if you're not totally better when you finish let me know

## 2016-09-29 NOTE — Progress Notes (Signed)
   Subjective:    Patient ID: Connie [name], female    DOB: 1934/02/12, 83 y.o.   MRN: [id number]  HPI She complains of a four-day history this started with malaise followed by hoarse voice, per the postnasal drainage, chest congestion and now a productive cough. No fever, chills, earache or sore throat. She does not smoke. Recently her husband of over 50 years died in she is in the process of settling the estate and all the legal affairs. She seems to be handling this well and is getting help from her children concerning this.   Review of Systems     Objective:   Physical Exam Alert and in no distress. Nasal mucosa is normal. Slight tenderness over maxillary sinuses. Tympanic membranes and canals are normal. Pharyngeal area is normal. Neck is supple without adenopathy or thyromegaly. Cardiac exam shows a regular sinus rhythm without murmurs or gallops. Lungs are clear to auscultation.        Assessment & Plan:  Acute non-recurrent sinusitis, unspecified location - Plan: amoxicillin-clavulanate (AUGMENTIN) 875-125 MG tablet  Acute bronchitis, unspecified organism  Bereavement counseling She will be back here in several weeks for another checkup. She is to call if not entirely better when she finishes the antibiotic. Also discussed the death of her husband. Encouraged her to not be afraid to deal with all the emotions she is having. Explained the fact that is going to take a few years for this to completely settle down.

## 2016-10-01 DIAGNOSIS — H353211 Exudative age-related macular degeneration, right eye, with active choroidal neovascularization: Secondary | ICD-10-CM | POA: Diagnosis not present

## 2016-10-11 ENCOUNTER — Other Ambulatory Visit: Payer: Self-pay | Admitting: Family Medicine

## 2016-10-11 DIAGNOSIS — I5022 Chronic systolic (congestive) heart failure: Secondary | ICD-10-CM

## 2016-10-15 ENCOUNTER — Telehealth: Payer: Self-pay | Admitting: Family Medicine

## 2016-10-15 ENCOUNTER — Other Ambulatory Visit: Payer: Self-pay | Admitting: Medical

## 2016-10-15 DIAGNOSIS — J019 Acute sinusitis, unspecified: Secondary | ICD-10-CM

## 2016-10-15 MED ORDER — AMOXICILLIN-POT CLAVULANATE 875-125 MG PO TABS: 1.0000 | ORAL_TABLET | Freq: Two times a day (BID) | ORAL | 0 refills | Status: DC

## 2016-10-15 NOTE — Telephone Encounter (Signed)
Pt said she finished Amoxicillin but drainage  in her throat and cough has started again. Requesting refill on Amoxicillin. Call pt back to let her know if this is approved

## 2016-10-15 NOTE — Telephone Encounter (Signed)
Advised pt

## 2016-10-15 NOTE — Telephone Encounter (Signed)
I sent 5 more days of the Augmentin

## 2016-10-19 ENCOUNTER — Other Ambulatory Visit: Payer: Self-pay

## 2016-10-19 ENCOUNTER — Ambulatory Visit (INDEPENDENT_AMBULATORY_CARE_PROVIDER_SITE_OTHER): Payer: Medicare HMO | Admitting: Family Medicine

## 2016-10-19 ENCOUNTER — Ambulatory Visit
Admission: RE | Admit: 2016-10-19 | Discharge: 2016-10-19 | Disposition: A | Discharge status: Home / Self Care | Payer: Commercial Managed Care - HMO | Source: Ambulatory Visit | Attending: Family Medicine | Admitting: Family Medicine

## 2016-10-19 VITALS — BP 120/80 | HR 66 | Wt 138.0 lb

## 2016-10-19 DIAGNOSIS — E039 Hypothyroidism, unspecified: Secondary | ICD-10-CM

## 2016-10-19 DIAGNOSIS — E1169 Type 2 diabetes mellitus with other specified complication: Secondary | ICD-10-CM | POA: Diagnosis not present

## 2016-10-19 DIAGNOSIS — D500 Iron deficiency anemia secondary to blood loss (chronic): Secondary | ICD-10-CM | POA: Diagnosis not present

## 2016-10-19 DIAGNOSIS — E785 Hyperlipidemia, unspecified: Secondary | ICD-10-CM

## 2016-10-19 DIAGNOSIS — I5022 Chronic systolic (congestive) heart failure: Secondary | ICD-10-CM

## 2016-10-19 DIAGNOSIS — E1121 Type 2 diabetes mellitus with diabetic nephropathy: Secondary | ICD-10-CM

## 2016-10-19 DIAGNOSIS — E1159 Type 2 diabetes mellitus with other circulatory complications: Secondary | ICD-10-CM

## 2016-10-19 DIAGNOSIS — I509 Heart failure, unspecified: Secondary | ICD-10-CM | POA: Diagnosis not present

## 2016-10-19 DIAGNOSIS — I10 Essential (primary) hypertension: Secondary | ICD-10-CM | POA: Diagnosis not present

## 2016-10-19 DIAGNOSIS — E118 Type 2 diabetes mellitus with unspecified complications: Secondary | ICD-10-CM | POA: Diagnosis not present

## 2016-10-19 DIAGNOSIS — R609 Edema, unspecified: Secondary | ICD-10-CM | POA: Diagnosis not present

## 2016-10-19 DIAGNOSIS — I700 Atherosclerosis of aorta: Secondary | ICD-10-CM | POA: Insufficient documentation

## 2016-10-19 DIAGNOSIS — J019 Acute sinusitis, unspecified: Secondary | ICD-10-CM | POA: Diagnosis not present

## 2016-10-19 LAB — LIPID PANEL
Cholesterol: 160 mg/dL (ref <200)
HDL: 57 mg/dL (ref >50)
LDL CALC: 81 mg/dL (ref <100)
Total CHOL/HDL Ratio: 2.8 Ratio (ref <5.0)
Triglycerides: 109 mg/dL (ref <150)
VLDL: 22 mg/dL (ref <30)

## 2016-10-19 LAB — CBC WITH DIFFERENTIAL/PLATELET
BASOS PCT: 1 %
Basophils Absolute: 45 cells/uL (ref 0–200)
EOS PCT: 4 %
Eosinophils Absolute: 180 cells/uL (ref 15–500)
HCT: 23.3 % — ABNORMAL LOW (ref 35.0–45.0)
Hemoglobin: 7.2 g/dL — ABNORMAL LOW (ref 11.7–15.5)
LYMPHS PCT: 12 %
Lymphs Abs: 540 cells/uL — ABNORMAL LOW (ref 850–3900)
MCH: 28.9 pg (ref 27.0–33.0)
MCHC: 30.9 g/dL — ABNORMAL LOW (ref 32.0–36.0)
MCV: 93.6 fL (ref 80.0–100.0)
MONO ABS: 495 {cells}/uL (ref 200–950)
MONOS PCT: 11 %
MPV: 11.2 fL (ref 7.5–12.5)
Neutro Abs: 3240 cells/uL (ref 1500–7800)
Neutrophils Relative %: 72 %
Platelets: 237 10*3/uL (ref 140–400)
RBC: 2.49 MIL/uL — AB (ref 3.80–5.10)
RDW: 15.3 % — AB (ref 11.0–15.0)
WBC: 4.5 10*3/uL (ref 4.0–10.5)

## 2016-10-19 LAB — COMPREHENSIVE METABOLIC PANEL
ALK PHOS: 56 U/L (ref 33–130)
ALT: 3 U/L — AB (ref 6–29)
AST: 19 U/L (ref 10–35)
Albumin: 3.5 g/dL — ABNORMAL LOW (ref 3.6–5.1)
BILIRUBIN TOTAL: 0.5 mg/dL (ref 0.2–1.2)
BUN: 26 mg/dL — ABNORMAL HIGH (ref 7–25)
CO2: 25 mmol/L (ref 20–31)
Calcium: 8.4 mg/dL — ABNORMAL LOW (ref 8.6–10.4)
Chloride: 108 mmol/L (ref 98–110)
Creat: 1.21 mg/dL — ABNORMAL HIGH (ref 0.60–0.88)
GLUCOSE: 109 mg/dL — AB (ref 65–99)
Potassium: 4.3 mmol/L (ref 3.5–5.3)
SODIUM: 141 mmol/L (ref 135–146)
Total Protein: 6.4 g/dL (ref 6.1–8.1)

## 2016-10-19 LAB — TSH: TSH: 21.54 mIU/L — ABNORMAL HIGH

## 2016-10-19 LAB — POCT GLYCOSYLATED HEMOGLOBIN (HGB A1C): HEMOGLOBIN A1C: 6.3

## 2016-10-19 MED ORDER — AMOXICILLIN-POT CLAVULANATE 875-125 MG PO TABS: 1.0000 | ORAL_TABLET | Freq: Two times a day (BID) | ORAL | 0 refills | Status: DC

## 2016-10-19 NOTE — Progress Notes (Signed)
Subjective:    Patient ID: Connie [name], female    DOB: October 27, 1933, 83 y.o.   MRN: [id number]  Connie [name] is a 83 y.o. female who presents for follow-up of Type 2 diabetes mellitus.  Patient is not checking home blood sugars.   Home blood sugar records: n/a How often is blood sugars being checked: n/a Current symptoms/problems none/ legs swelling, full of fluid has to go to bathroom alot Daily foot checks: yes   Any foot concerns: toenails Last eye exam: 01/23/16 & 10/17  Exercise: riding bike  She continues to have difficulty with cough, congestion and postnasal drainage but no shortness of breath, fever, chills, chest pain, PND or DOE. She also has noted over the last week or so increase in her weight and swelling in her feet. Presently she is not taking amlodipine. She ran out. Her medications were reviewed by me and my nurse. The following portions of the patient's history were reviewed and updated as appropriate: allergies, current medications, past medical history, past social history and problem list.  ROS as in subjective above.     Objective:    Physical Exam Blood pressure 120/80, pulse 66, weight 138 lb (62.6 kg), SpO2 99 %.  Alert and in no distress. Tympanic membranes and canals are normal. Pharyngeal area is normal. Neck is supple without adenopathy or thyromegaly. Cardiac exam shows a regular sinus rhythm without murmurs or gallops. Lungs show bibasilar rales. Lower extremities show 3+ pitting edema.  Lab Review Diabetic Labs Latest Ref Rng & Units 10/19/2016 08/03/2016 06/10/2016 05/12/2016 04/28/2016  HbA1c - 6.3 - - - -  Microalbumin mg/L - - - 41.4 -  Micro/Creat Ratio - - - - >276 -  Chol 125 - 200 mg/dL - - - 137 -  HDL >=46 mg/dL - - - 47 -  Calc LDL <130 mg/dL - - - 66 -  Triglycerides <150 mg/dL - - - 119 -  Creatinine 0.60 - 0.88 mg/dL - 1.16(H) 1.1 - 1.1   BP/Weight 10/19/2016 09/29/2016 08/03/2016 06/10/2016 123456  Systolic BP 123456 [id number] 123456  [id number] [id number]  Diastolic BP 80 80 52 50 70  Wt. (Lbs) 138 131 123.6 132.2 132  BMI 29.86 28.35 26.75 27.63 27.59   Foot/eye exam completion dates Latest Ref Rng & Units 12/23/2015 04/01/2015  Eye Exam No Retinopathy No Retinopathy No Retinopathy  Foot Form Completion - - -  1C is 6.3  Connie [name]  reports that she has never smoked. She has never used smokeless tobacco. She reports that she does not drink alcohol or use drugs.     Assessment & Plan:    Type 2 diabetes mellitus with complication, without long-term current use of insulin ([name]) - Plan: HgB A1c, CBC with Differential/Platelet, Comprehensive metabolic panel, Lipid panel  Hyperlipidemia associated with type 2 diabetes mellitus ([place]) - Plan: Lipid panel  Chronic systolic CHF (congestive heart failure) ([place]) - Plan: Brain natriuretic peptide, DG Chest 2 View, EKG 12-Lead  Diabetic nephropathy associated with type 2 diabetes mellitus ([place]) - Plan: Comprehensive metabolic panel  Hypothyroidism, unspecified type - Plan: TSH  Hypertension associated with diabetes ([place])  Iron deficiency anemia due to chronic blood loss  Peripheral edema - Plan: EKG 12-Lead  Acute non-recurrent sinusitis, unspecified location - Plan: amoxicillin-clavulanate (AUGMENTIN) 875-125 MG tablet  Aortic atherosclerosis ([name])   1. Rx changes: I will have her stop the glitazone and will not place her back on amlodipine. I will give her Augmentin to help with  the infection. 2. Education: Reviewed 'ABCs' of diabetes management (respective goals in parentheses):  A1C (<7), blood pressure (<130/80), and cholesterol (LDL <100). 3. Compliance at present is estimated to be fair. Efforts to improve compliance (if necessary) will be directed at Nothing until blood work and x-rays are back. 4. Follow up: 4 months X-ray did show minor changes with aortic atherosclerosis. I will await blood results before further

## 2016-10-19 NOTE — Patient Instructions (Signed)
Take 2 of the furosemide daily and monitor your weight

## 2016-10-20 ENCOUNTER — Observation Stay (HOSPITAL_COMMUNITY)
Admission: EM | Admit: 2016-10-20 | Discharge: 2016-10-21 | Disposition: A | Discharge status: Home / Self Care | Payer: Medicare HMO | Attending: Internal Medicine | Admitting: Internal Medicine

## 2016-10-20 ENCOUNTER — Encounter (HOSPITAL_COMMUNITY): Payer: Self-pay | Admitting: Emergency Medicine

## 2016-10-20 DIAGNOSIS — I5022 Chronic systolic (congestive) heart failure: Secondary | ICD-10-CM | POA: Insufficient documentation

## 2016-10-20 DIAGNOSIS — E8770 Fluid overload, unspecified: Secondary | ICD-10-CM

## 2016-10-20 DIAGNOSIS — I10 Essential (primary) hypertension: Secondary | ICD-10-CM

## 2016-10-20 DIAGNOSIS — I2510 Atherosclerotic heart disease of native coronary artery without angina pectoris: Secondary | ICD-10-CM | POA: Diagnosis not present

## 2016-10-20 DIAGNOSIS — R195 Other fecal abnormalities: Secondary | ICD-10-CM | POA: Diagnosis not present

## 2016-10-20 DIAGNOSIS — K31811 Angiodysplasia of stomach and duodenum with bleeding: Secondary | ICD-10-CM | POA: Diagnosis not present

## 2016-10-20 DIAGNOSIS — K922 Gastrointestinal hemorrhage, unspecified: Secondary | ICD-10-CM | POA: Diagnosis present

## 2016-10-20 DIAGNOSIS — D62 Acute posthemorrhagic anemia: Secondary | ICD-10-CM | POA: Diagnosis not present

## 2016-10-20 DIAGNOSIS — E7800 Pure hypercholesterolemia, unspecified: Secondary | ICD-10-CM | POA: Insufficient documentation

## 2016-10-20 DIAGNOSIS — E669 Obesity, unspecified: Secondary | ICD-10-CM | POA: Diagnosis not present

## 2016-10-20 DIAGNOSIS — E1159 Type 2 diabetes mellitus with other circulatory complications: Secondary | ICD-10-CM | POA: Diagnosis present

## 2016-10-20 DIAGNOSIS — Z6828 Body mass index (BMI) 28.0-28.9, adult: Secondary | ICD-10-CM | POA: Diagnosis not present

## 2016-10-20 DIAGNOSIS — E785 Hyperlipidemia, unspecified: Secondary | ICD-10-CM | POA: Insufficient documentation

## 2016-10-20 DIAGNOSIS — E118 Type 2 diabetes mellitus with unspecified complications: Secondary | ICD-10-CM | POA: Diagnosis present

## 2016-10-20 DIAGNOSIS — I429 Cardiomyopathy, unspecified: Secondary | ICD-10-CM | POA: Insufficient documentation

## 2016-10-20 DIAGNOSIS — E890 Postprocedural hypothyroidism: Secondary | ICD-10-CM | POA: Diagnosis not present

## 2016-10-20 DIAGNOSIS — I110 Hypertensive heart disease with heart failure: Secondary | ICD-10-CM | POA: Insufficient documentation

## 2016-10-20 DIAGNOSIS — Z96653 Presence of artificial knee joint, bilateral: Secondary | ICD-10-CM | POA: Insufficient documentation

## 2016-10-20 DIAGNOSIS — E039 Hypothyroidism, unspecified: Secondary | ICD-10-CM | POA: Diagnosis present

## 2016-10-20 DIAGNOSIS — E1122 Type 2 diabetes mellitus with diabetic chronic kidney disease: Secondary | ICD-10-CM | POA: Diagnosis not present

## 2016-10-20 DIAGNOSIS — Z79899 Other long term (current) drug therapy: Secondary | ICD-10-CM | POA: Insufficient documentation

## 2016-10-20 DIAGNOSIS — D500 Iron deficiency anemia secondary to blood loss (chronic): Secondary | ICD-10-CM | POA: Diagnosis not present

## 2016-10-20 DIAGNOSIS — E119 Type 2 diabetes mellitus without complications: Secondary | ICD-10-CM | POA: Diagnosis present

## 2016-10-20 DIAGNOSIS — E1169 Type 2 diabetes mellitus with other specified complication: Secondary | ICD-10-CM | POA: Diagnosis not present

## 2016-10-20 DIAGNOSIS — I152 Hypertension secondary to endocrine disorders: Secondary | ICD-10-CM | POA: Diagnosis present

## 2016-10-20 DIAGNOSIS — I428 Other cardiomyopathies: Secondary | ICD-10-CM

## 2016-10-20 LAB — BASIC METABOLIC PANEL
ANION GAP: 9 (ref 5–15)
BUN: 33 mg/dL — ABNORMAL HIGH (ref 6–20)
CHLORIDE: 108 mmol/L (ref 101–111)
CO2: 25 mmol/L (ref 22–32)
Calcium: 8.4 mg/dL — ABNORMAL LOW (ref 8.9–10.3)
Creatinine, Ser: 1.27 mg/dL — ABNORMAL HIGH (ref 0.44–1.00)
GFR calc non Af Amer: 38 mL/min — ABNORMAL LOW (ref >60)
GFR, EST AFRICAN AMERICAN: 45 mL/min — AB (ref >60)
Glucose, Bld: 146 mg/dL — ABNORMAL HIGH (ref 65–99)
POTASSIUM: 4 mmol/L (ref 3.5–5.1)
Sodium: 142 mmol/L (ref 135–145)

## 2016-10-20 LAB — CBC WITH DIFFERENTIAL/PLATELET
BASOS ABS: 0 10*3/uL (ref 0.0–0.1)
BASOS PCT: 1 %
Eosinophils Absolute: 0.3 10*3/uL (ref 0.0–0.7)
Eosinophils Relative: 6 %
HEMATOCRIT: 23.1 % — AB (ref 36.0–46.0)
HEMOGLOBIN: 7.1 g/dL — AB (ref 12.0–15.0)
Lymphocytes Relative: 19 %
Lymphs Abs: 0.8 10*3/uL (ref 0.7–4.0)
MCH: 29.6 pg (ref 26.0–34.0)
MCHC: 30.7 g/dL (ref 30.0–36.0)
MCV: 96.3 fL (ref 78.0–100.0)
Monocytes Absolute: 0.7 10*3/uL (ref 0.1–1.0)
Monocytes Relative: 16 %
NEUTROS ABS: 2.4 10*3/uL (ref 1.7–7.7)
NEUTROS PCT: 58 %
Platelets: 241 10*3/uL (ref 150–400)
RBC: 2.4 MIL/uL — AB (ref 3.87–5.11)
RDW: 16.3 % — ABNORMAL HIGH (ref 11.5–15.5)
WBC: 4.2 10*3/uL (ref 4.0–10.5)

## 2016-10-20 LAB — BRAIN NATRIURETIC PEPTIDE
B NATRIURETIC PEPTIDE 5: 290.7 pg/mL — AB (ref 0.0–100.0)
Brain Natriuretic Peptide: 457.5 pg/mL — ABNORMAL HIGH (ref <100)

## 2016-10-20 LAB — POC OCCULT BLOOD, ED: Fecal Occult Bld: POSITIVE — AB

## 2016-10-20 LAB — PREPARE RBC (CROSSMATCH)

## 2016-10-20 LAB — ABO/RH: ABO/RH(D): A POS

## 2016-10-20 MED ORDER — SACUBITRIL-VALSARTAN 49-51 MG PO TABS
1.0000 | ORAL_TABLET | Freq: Two times a day (BID) | ORAL | Status: DC
  Administered 2016-10-21 (×2): 1 via ORAL
  Filled 2016-10-20 (×3): qty 1

## 2016-10-20 MED ORDER — ONDANSETRON HCL 4 MG PO TABS: 4.0000 mg | ORAL_TABLET | Freq: Four times a day (QID) | ORAL | Status: DC | PRN

## 2016-10-20 MED ORDER — ONDANSETRON HCL 4 MG/2ML IJ SOLN: 4.0000 mg | Freq: Four times a day (QID) | INTRAMUSCULAR | Status: DC | PRN

## 2016-10-20 MED ORDER — POTASSIUM CHLORIDE CRYS ER 10 MEQ PO TBCR: 10.0000 meq | EXTENDED_RELEASE_TABLET | ORAL | Status: DC

## 2016-10-20 MED ORDER — SODIUM CHLORIDE 0.9 % IV SOLN: Freq: Once | INTRAVENOUS | Status: DC

## 2016-10-20 MED ORDER — LEVOTHYROXINE SODIUM 25 MCG PO TABS
125.0000 ug | ORAL_TABLET | Freq: Every day | ORAL | Status: DC
  Administered 2016-10-21: 125 ug via ORAL
  Filled 2016-10-20: qty 1

## 2016-10-20 MED ORDER — ATORVASTATIN CALCIUM 40 MG PO TABS
40.0000 mg | ORAL_TABLET | Freq: Every day | ORAL | Status: DC
  Administered 2016-10-21: 40 mg via ORAL
  Filled 2016-10-20: qty 1

## 2016-10-20 MED ORDER — FUROSEMIDE 10 MG/ML IJ SOLN
40.0000 mg | Freq: Once | INTRAMUSCULAR | Status: AC
Start: 2016-10-20 — End: 2016-10-21
  Administered 2016-10-21: 40 mg via INTRAVENOUS
  Filled 2016-10-20: qty 4

## 2016-10-20 MED ORDER — AMLODIPINE BESYLATE 10 MG PO TABS
10.0000 mg | ORAL_TABLET | Freq: Every day | ORAL | Status: DC
  Administered 2016-10-21: 10 mg via ORAL
  Filled 2016-10-20: qty 1

## 2016-10-20 MED ORDER — AMOXICILLIN-POT CLAVULANATE 875-125 MG PO TABS
1.0000 | ORAL_TABLET | Freq: Two times a day (BID) | ORAL | Status: DC
  Administered 2016-10-21 (×2): 1 via ORAL
  Filled 2016-10-20 (×2): qty 1

## 2016-10-20 MED ORDER — ACETAMINOPHEN 650 MG RE SUPP: 650.0000 mg | Freq: Four times a day (QID) | RECTAL | Status: DC | PRN

## 2016-10-20 MED ORDER — CARVEDILOL 6.25 MG PO TABS
6.2500 mg | ORAL_TABLET | Freq: Two times a day (BID) | ORAL | Status: DC
  Administered 2016-10-21 (×2): 6.25 mg via ORAL
  Filled 2016-10-20 (×2): qty 1

## 2016-10-20 MED ORDER — HYDRALAZINE HCL 20 MG/ML IJ SOLN
10.0000 mg | INTRAMUSCULAR | Status: DC | PRN
  Administered 2016-10-20: 10 mg via INTRAVENOUS
  Filled 2016-10-20: qty 1

## 2016-10-20 MED ORDER — ACETAMINOPHEN 325 MG PO TABS: 650.0000 mg | ORAL_TABLET | Freq: Four times a day (QID) | ORAL | Status: DC | PRN

## 2016-10-20 MED ORDER — PANTOPRAZOLE SODIUM 20 MG PO TBEC
20.0000 mg | DELAYED_RELEASE_TABLET | Freq: Every day | ORAL | Status: DC
  Administered 2016-10-21: 20 mg via ORAL
  Filled 2016-10-20: qty 1

## 2016-10-20 NOTE — H&P (Signed)
History and Physical    Connie [name] [id number] DOB: Jan 06, 1934 DOA: 10/20/2016  PCP: Connie Haste, MD  Patient coming from: Home.  Chief Complaint: Low hemoglobin.  HPI: Connie [name] is a 83 y.o. female with a hx of systolic HF 2/2 NICM, chronic anemia in the setting of gastric AVMs, DM, hypothyroidism who was referred to the ER with patient's primary care physician as patient's routine blood workup showed low hemoglobin. Patient's hemoglobin is usually around 10 and today it was found to be around 7. In the ER patient's stool for occult blood was positive. Patient as such has not noticed any obvious GI bleed. Denies any chest pain has been having some shortness of breath for which is chronic and has noticed increasing lower extremity edema over the last few days. Patient is being admitted for further observation for worsening anemia with history of chronic GI bleed with history of gastric AVMs. Patient has had colonoscopy and endoscopy and small bowel endoscopy in 2015 and 2016. Patient is presently hemodynamically stable.   ED Course: 1 unit of PRBC has been ordered by the ER physician.  Review of Systems: As per HPI, rest all negative.   Past Medical History:  Diagnosis Date  . Allergy    RHINITIS  . Angiodysplasia of duodenum    hx/notes 11/21/2015  . Angiodysplasia of stomach    hx/notes 11/21/2015  . Arthritis    "joints ache"  . Bleeding gastric ulcer    hx/notes 11/21/2015  . Chronic blood loss anemia    secondary to angiodysplasia of the stomach and duodenum as well as history of bleeding gastric ulcer hx/notes 11/21/2015  . Chronic systolic CHF (congestive heart failure) ([place]) 12/23/2015  . History of blood transfusion 01/2015; 06/2015; 11/21/2015  . Hypercholesteremia   . Hypertension   . Hypothyroidism   . Obesity   . Pneumonia "several times"  . Type II diabetes mellitus ([place])     Past Surgical History:  Procedure Laterality Date  . CARDIAC  CATHETERIZATION N/A 12/19/2015   Procedure: Left Heart Cath and Coronary Angiography;  Surgeon: Leonie Man, MD;  Location: [place];  Service: Cardiovascular;  Laterality: N/A;  . COLONOSCOPY N/A 08/30/2014   Procedure: COLONOSCOPY;  Surgeon: Beryle Beams, MD;  Location: [facility] ENDOSCOPY;  Service: Endoscopy;  Laterality: N/A;  . COLONOSCOPY N/A 02/21/2015   Procedure: COLONOSCOPY;  Surgeon: Carol Ada, MD;  Location: [facility] ENDOSCOPY;  Service: Endoscopy;  Laterality: N/A;  . ENTEROSCOPY N/A 06/06/2015   Procedure: ENTEROSCOPY;  Surgeon: Carol Ada, MD;  Location: [facility] ENDOSCOPY;  Service: Endoscopy;  Laterality: N/A;  . FRACTURE SURGERY    . GIVENS CAPSULE STUDY N/A 05/15/2015   Procedure: GIVENS CAPSULE STUDY;  Surgeon: Carol Ada, MD;  Location: [facility] ENDOSCOPY;  Service: Endoscopy;  Laterality: N/A;  . HOT HEMOSTASIS N/A 08/30/2014   Procedure: HOT HEMOSTASIS (ARGON PLASMA COAGULATION/BICAP);  Surgeon: Beryle Beams, MD;  Location: [facility];  Service: Endoscopy;  Laterality: N/A;  . HOT HEMOSTASIS N/A 06/06/2015   Procedure: HOT HEMOSTASIS (ARGON PLASMA COAGULATION/BICAP);  Surgeon: Carol Ada, MD;  Location: [facility]OS[facility];  Service: Endoscopy;  Laterality: N/A;  . JOINT REPLACEMENT    . REVISION TOTAL KNEE ARTHROPLASTY Bilateral 2004-2015  . SHOULDER OPEN ROTATOR CUFF REPAIR Right 12/2004   open subacromial decompression, distal clavicle  resection, rotator cuff repair/notes 02/02/2011  . TONSILLECTOMY  1940s  . TOTAL KNEE ARTHROPLASTY Bilateral ~ 2002  . TOTAL THYROIDECTOMY  ~ 1966  reports that she has never smoked. She has never used smokeless tobacco. She reports that she does not drink alcohol or use drugs.  No Known Allergies  Family History  Problem Relation Age of Onset  . Asthma Mother   . Ulcers Father   . Anemia Sister     Prior to Admission medications   Medication Sig Start Date End Date Taking? Authorizing Provider  amLODipine (NORVASC) 10 MG tablet  Take 1 tablet by mouth daily. 03/15/16  Yes Historical Provider, MD  amoxicillin-clavulanate (AUGMENTIN) 875-125 MG tablet Take 1 tablet by mouth 2 (two) times daily. 10/19/16  Yes [name], MD  atorvastatin (LIPITOR) 40 MG tablet TAKE ONE TABLET BY MOUTH ONCE DAILY 09/22/16  Yes [name], MD  carvedilol (COREG) 6.25 MG tablet Take 1 tablet (6.25 mg total) by mouth 2 (two) times daily with a meal. 03/22/16  Yes Jerline Pain, MD  furosemide (LASIX) 20 MG tablet TAKE 1 TABLET EVERY DAY Patient taking differently: TAKE 1 TABLET BY MOUTH EVERY DAY 10/12/16  Yes [name], MD  levothyroxine (SYNTHROID, LEVOTHROID) 125 MCG tablet TAKE 1 TABLET EVERY DAY Patient taking differently: TAKE 1 TABLET BY MOUTH EVERY DAY 01/15/16  Yes [name], MD  Multiple Vitamins-Minerals (OCUVITE PRESERVISION PO) Take 1 capsule by mouth 2 (two) times daily.   Yes Historical Provider, MD  Multiple Vitamins-Minerals (WOMENS MULTIVITAMIN PLUS PO) Take 1 tablet by mouth daily. 01/15/16  Yes Historical Provider, MD  pantoprazole (PROTONIX) 20 MG tablet Take 1 tablet (20 mg total) by mouth daily. 04/05/16  Yes [name], MD  pioglitazone (ACTOS) 30 MG tablet Take 1 tablet (30 mg total) by mouth daily. 09/22/16  Yes [name], MD  potassium chloride (K-DUR) 10 MEQ tablet Take 1 tablet (10 mEq total) by mouth as directed. Take 10 meq twice a day every Mon, Wed and Fri's 08/04/16 11/02/16 Yes Scott T Weaver, PA-C  sacubitril-valsartan (ENTRESTO) 49-51 MG Take 1 tablet by mouth 2 (two) times daily. 04/13/16  Yes Jerline Pain, MD    Physical Exam: Vitals:   10/20/16 1930 10/20/16 2017 10/20/16 2115 10/20/16 2243  BP: 148/55 (!) 163/45 (!) 181/58 (!) 158/41  Pulse: 60 67  70  Resp: 15 16  16   Temp:  97.9 F (36.6 C)  98 F (36.7 C)  TempSrc:  Oral  Oral  SpO2: 99% 99%  98%  Weight:  61 kg (134 lb 7.7 oz)    Height:  4\' 10"  (1.473 m)        Constitutional: Moderately built and nourished. Vitals:    10/20/16 1930 10/20/16 2017 10/20/16 2115 10/20/16 2243  BP: 148/55 (!) 163/45 (!) 181/58 (!) 158/41  Pulse: 60 67  70  Resp: 15 16  16   Temp:  97.9 F (36.6 C)  98 F (36.7 C)  TempSrc:  Oral  Oral  SpO2: 99% 99%  98%  Weight:  61 kg (134 lb 7.7 oz)    Height:  4\' 10"  (1.473 m)     Eyes: Anicteric no pallor. ENMT: No discharge from the ears eyes nose or mouth. Neck: No mass felt. No JVD appreciated. Respiratory: No rhonchi or crepitations. Cardiovascular: S1 and S2 heard. No murmurs appreciated. Abdomen: Soft nontender bowel sounds present. No guarding or rigidity. Musculoskeletal: Bilateral lower extremity edema. Skin: No rash. Skin appears warm. Neurologic: Alert awake oriented to time place and person. Moves all extremities. Psychiatric: Appears normal. Normal affect.   Labs  on Admission: I have personally reviewed following labs and imaging studies  CBC:  Recent Labs Lab 10/19/16 1147 10/20/16 1750  WBC 4.5 4.2  NEUTROABS 3,240 2.4  HGB 7.2* 7.1*  HCT 23.3* 23.1*  MCV 93.6 96.3  PLT 237 [id number]   Basic Metabolic Panel:  Recent Labs Lab 10/19/16 1147 10/20/16 1750  NA 141 142  K 4.3 4.0  CL 108 108  CO2 25 25  GLUCOSE 109* 146*  BUN 26* 33*  CREATININE 1.21* 1.27*  CALCIUM 8.4* 8.4*   GFR: Estimated Creatinine Clearance: 26.8 mL/min (by C-G formula based on SCr of 1.27 mg/dL (H)). Liver Function Tests:  Recent Labs Lab 10/19/16 1147  AST 19  ALT 3*  ALKPHOS 56  BILITOT 0.5  PROT 6.4  ALBUMIN 3.5*   No results for input(s): LIPASE, AMYLASE in the last 168 hours. No results for input(s): AMMONIA in the last 168 hours. Coagulation Profile: No results for input(s): INR, PROTIME in the last 168 hours. Cardiac Enzymes: No results for input(s): CKTOTAL, CKMB, CKMBINDEX, TROPONINI in the last 168 hours. BNP (last 3 results) No results for input(s): PROBNP in the last 8760 hours. HbA1C:  Recent Labs  10/19/16 1315  HGBA1C 6.3   CBG: No  results for input(s): GLUCAP in the last 168 hours. Lipid Profile:  Recent Labs  10/19/16 1147  CHOL 160  HDL 57  LDLCALC 81  TRIG 109  CHOLHDL 2.8   Thyroid Function Tests:  Recent Labs  10/19/16 1147  TSH 21.54*   Anemia Panel: No results for input(s): VITAMINB12, FOLATE, FERRITIN, TIBC, IRON, RETICCTPCT in the last 72 hours. Urine analysis:    Component Value Date/Time   COLORURINE YELLOW 12/16/2015 2347   APPEARANCEUR CLEAR 12/16/2015 2347   LABSPEC 1.012 12/16/2015 2347   PHURINE 5.0 12/16/2015 2347   GLUCOSEU 100 (A) 12/16/2015 2347   HGBUR TRACE (A) 12/16/2015 2347   BILIRUBINUR NEGATIVE 12/16/2015 2347   BILIRUBINUR n 05/09/2015 1656   [place] 12/16/2015 2347   PROTEINUR 30 (A) 12/16/2015 2347   UROBILINOGEN negative 05/09/2015 1656   UROBILINOGEN 0.2 02/19/2015 1823   NITRITE NEGATIVE 12/16/2015 2347   LEUKOCYTESUR NEGATIVE 12/16/2015 2347   Sepsis Labs: @LABRCNTIP (procalcitonin:4,lacticidven:4) )No results found for this or any previous visit (from the past 240 hour(s)).   Radiological Exams on Admission: Dg Chest 2 View  Result Date: 10/19/2016 CLINICAL DATA:  Chronic congestive heart failure. EXAM: CHEST  2 VIEW COMPARISON:  Radiographs of December 17, 2015. FINDINGS: Stable cardiomegaly and central pulmonary vascular congestion is noted. Atherosclerosis of thoracic aorta is noted. No pneumothorax is noted. Minimal left pleural effusion is noted. No consolidative process is noted. Bony thorax is unremarkable. IMPRESSION: Stable cardiomegaly and central pulmonary vascular congestion. Aortic atherosclerosis. Minimal left pleural effusion. Electronically Signed   By: Marijo Conception, M.D.   On: 10/19/2016 14:24    Assessment/Plan Principal Problem:   Acute GI bleeding Active Problems:   Hypertension associated with diabetes ([place])   Hypothyroidism   Hyperlipidemia associated with type 2 diabetes mellitus ([name])   Type 2 diabetes with complication  ([name])   NICM (nonischemic cardiomyopathy) ([name])   Anemia   GI bleeding   Fluid overload    1. GI bleed probably acute on chronic with history of gastric AVMs - patient is receiving 1 unit of packed red blood cell transfusion. Recheck hemoglobin after transfusion. Will keep patient nothing by mouth for possible GI procedure. Patient is known to Dr. Benson Norway gastroenterologist. 2. Acute  blood loss anemia - follow CBC after transfusion. 3. Nonischemic cardiomyopathy - has increasing lower extremity edema. I have ordered Lasix 40 mg IV daily. Patient is on Entresto and Coreg. 4. Hypertension - continue amlodipine and also on Entresto and Coreg. 5. Hypothyroidism on Synthroid. 6. Diabetes mellitus type 2 - will keep patient on sliding scale coverage. Patient has significant lower extremity edema and may need to be off Actos eventually. 7. Recently placed on Augmentin for sinusitis.   DVT prophylaxis: SCDs. Code Status: Full code.  Family Communication: Discussed with patient.  Disposition Plan: Home.  Consults called: None.  Admission status: Observation.    Rise Patience MD [facility] Pager [phone].  If 7PM-7AM, please contact night-coverage [email or web address] Password [email or web address]  10/20/2016, 11:05 PM

## 2016-10-20 NOTE — ED Provider Notes (Signed)
[place] DEPT Provider Note   CSN: [id number] Arrival date & time: 10/20/16  1704     History   Chief Complaint Chief Complaint  Patient presents with  . Abnormal Lab    HPI Connie [name] is a 83 y.o. female.  HPI Patient with PMH with recurrent GIB related to upper and lower GI tract AVMs requiring ablation (Dr. Benson Norway), blood transfusions, and IV iron infusions at the cancer center presents due to abnormal lab at PCP yesterday.  Hgb 7.2 and BNP 457.5.  She denies any fever, chills, cough, CP, SOB, dizziness, lightheadedness, palpitations, pre-syncope, syncope.  She does endorse bilateral lower extremity edema with L>R, but this is chronic and unchanged.  She is ambulatory.  No DOE or PND.  She reports hx of blood transfusions and has had EGD and colonoscopy recently.  She also receives iron infusions for chronic blood loss anemia.   Past Medical History:  Diagnosis Date  . Allergy    RHINITIS  . Angiodysplasia of duodenum    hx/notes 11/21/2015  . Angiodysplasia of stomach    hx/notes 11/21/2015  . Arthritis    "joints ache"  . Bleeding gastric ulcer    hx/notes 11/21/2015  . Chronic blood loss anemia    secondary to angiodysplasia of the stomach and duodenum as well as history of bleeding gastric ulcer hx/notes 11/21/2015  . Chronic systolic CHF (congestive heart failure) ([place]) 12/23/2015  . History of blood transfusion 01/2015; 06/2015; 11/21/2015  . Hypercholesteremia   . Hypertension   . Hypothyroidism   . Obesity   . Pneumonia "several times"  . Type II diabetes mellitus [facility])     Patient Active Problem List   Diagnosis Date Noted  . Anemia 10/20/2016  . Aortic atherosclerosis ([place]) 10/19/2016  . Long-term use of high-risk medication 08/04/2016  . Coronary artery disease involving native heart without angina pectoris 08/03/2016  . NICM (nonischemic cardiomyopathy) ([place]) 08/03/2016  . Senile purpura ([place]) 02/02/2016  . Macular degeneration (senile) of retina  12/23/2015  . Chronic systolic CHF (congestive heart failure) ([place]) 12/23/2015  . Cataract 12/02/2015  . Angiodysplasia of stomach and duodenum with hemorrhage 06/24/2015  . Iron deficiency anemia due to chronic blood loss 06/24/2015  . Type 2 diabetes with complication ([place])   . Obesity (BMI 30.0-34.9) 07/25/2014  . Hypertension associated with diabetes ([place]) 03/15/2012  . Hypothyroidism 03/15/2012  . Hyperlipidemia associated with type 2 diabetes mellitus ([place]) 03/15/2012    Past Surgical History:  Procedure Laterality Date  . CARDIAC CATHETERIZATION N/A 12/19/2015   Procedure: Left Heart Cath and Coronary Angiography;  Surgeon: Leonie Man, MD;  Location: [place] [facility];  Service: Cardiovascular;  Laterality: N/A;  . COLONOSCOPY N/A 08/30/2014   Procedure: COLONOSCOPY;  Surgeon: Beryle Beams, MD;  Location: [facility] ENDOSCOPY;  Service: Endoscopy;  Laterality: N/A;  . COLONOSCOPY N/A 02/21/2015   Procedure: COLONOSCOPY;  Surgeon: Carol Ada, MD;  Location: [facility] ENDOSCOPY;  Service: Endoscopy;  Laterality: N/A;  . ENTEROSCOPY N/A 06/06/2015   Procedure: ENTEROSCOPY;  Surgeon: Carol Ada, MD;  Location: [facility] ENDOSCOPY;  Service: Endoscopy;  Laterality: N/A;  . FRACTURE SURGERY    . GIVENS CAPSULE STUDY N/A 05/15/2015   Procedure: GIVENS CAPSULE STUDY;  Surgeon: Carol Ada, MD;  Location: [facility] ENDOSCOPY;  Service: Endoscopy;  Laterality: N/A;  . HOT HEMOSTASIS N/A 08/30/2014   Procedure: HOT HEMOSTASIS (ARGON PLASMA COAGULATION/BICAP);  Surgeon: Beryle Beams, MD;  Location: [facility] ENDOSCOPY;  Service: Endoscopy;  Laterality: N/A;  .  HOT HEMOSTASIS N/A 06/06/2015   Procedure: HOT HEMOSTASIS (ARGON PLASMA COAGULATION/BICAP);  Surgeon: Carol Ada, MD;  Location: [facility] ENDOSCOPY;  Service: Endoscopy;  Laterality: N/A;  . JOINT REPLACEMENT    . REVISION TOTAL KNEE ARTHROPLASTY Bilateral 2004-2015  . SHOULDER OPEN ROTATOR CUFF REPAIR Right 12/2004   open subacromial decompression, distal clavicle   resection, rotator cuff repair/notes 02/02/2011  . TONSILLECTOMY  1940s  . TOTAL KNEE ARTHROPLASTY Bilateral ~ 2002  . TOTAL THYROIDECTOMY  ~ 1966    OB History    No data available       Home Medications    Prior to Admission medications   Medication Sig Start Date End Date Taking? Authorizing Provider  amLODipine (NORVASC) 10 MG tablet Take 1 tablet by mouth daily. 03/15/16  Yes Historical Provider, MD  amoxicillin-clavulanate (AUGMENTIN) 875-125 MG tablet Take 1 tablet by mouth 2 (two) times daily. 10/19/16  Yes [name], MD  atorvastatin (LIPITOR) 40 MG tablet TAKE ONE TABLET BY MOUTH ONCE DAILY 09/22/16  Yes [name], MD  carvedilol (COREG) 6.25 MG tablet Take 1 tablet (6.25 mg total) by mouth 2 (two) times daily with a meal. 03/22/16  Yes Jerline Pain, MD  furosemide (LASIX) 20 MG tablet TAKE 1 TABLET EVERY DAY Patient taking differently: TAKE 1 TABLET BY MOUTH EVERY DAY 10/12/16  Yes [name], MD  levothyroxine (SYNTHROID, LEVOTHROID) 125 MCG tablet TAKE 1 TABLET EVERY DAY Patient taking differently: TAKE 1 TABLET BY MOUTH EVERY DAY 01/15/16  Yes [name], MD  Multiple Vitamins-Minerals (OCUVITE PRESERVISION PO) Take 1 capsule by mouth 2 (two) times daily.   Yes Historical Provider, MD  Multiple Vitamins-Minerals (WOMENS MULTIVITAMIN PLUS PO) Take 1 tablet by mouth daily. 01/15/16  Yes Historical Provider, MD  pantoprazole (PROTONIX) 20 MG tablet Take 1 tablet (20 mg total) by mouth daily. 04/05/16  Yes [name], MD  pioglitazone (ACTOS) 30 MG tablet Take 1 tablet (30 mg total) by mouth daily. 09/22/16  Yes [name], MD  potassium chloride (K-DUR) 10 MEQ tablet Take 1 tablet (10 mEq total) by mouth as directed. Take 10 meq twice a day every Mon, Wed and Fri's 08/04/16 11/02/16 Yes Scott T Weaver, PA-C  sacubitril-valsartan (ENTRESTO) 49-51 MG Take 1 tablet by mouth 2 (two) times daily. 04/13/16  Yes Jerline Pain, MD    Family History Family History    Problem Relation Age of Onset  . Asthma Mother   . Ulcers Father   . Anemia Sister     Social History Social History  Substance Use Topics  . Smoking status: Never Smoker  . Smokeless tobacco: Never Used  . Alcohol use No     Allergies   Patient has no known allergies.   Review of Systems Review of Systems All other systems negative unless otherwise stated in HPI   Physical Exam Updated Vital Signs BP (!) 181/58   Pulse 67   Temp 97.9 F (36.6 C) (Oral)   Resp 16   Ht 4\' 10"  (1.473 m)   Wt 61 kg   SpO2 99%   BMI 28.11 kg/m   Physical Exam  Constitutional: She is oriented to person, place, and time. She appears well-developed and well-nourished.  Non-toxic appearance. She does not have a sickly appearance. She does not appear ill.  HENT:  Head: Normocephalic and atraumatic.  Mouth/Throat: Oropharynx is clear and moist.  Eyes: Conjunctivae are normal. Pupils are equal, round, and reactive to  light.  Neck: Normal range of motion. Neck supple.  Cardiovascular: Normal rate and regular rhythm.   2+ pitting edema of bilateral lower extremities.   Pulmonary/Chest: Effort normal and breath sounds normal. No accessory muscle usage or stridor. No respiratory distress. She has no wheezes. She has no rhonchi. She has no rales.  Abdominal: Soft. Bowel sounds are normal. She exhibits no distension. There is no tenderness.  Genitourinary:  Genitourinary Comments: No gross blood on DRE.  Guaiac positive.   Musculoskeletal: Normal range of motion.  Lymphadenopathy:    She has no cervical adenopathy.  Neurological: She is alert and oriented to person, place, and time.  Speech clear without dysarthria.  Skin: Skin is warm and dry.  Psychiatric: She has a normal mood and affect. Her behavior is normal.     ED Treatments / Results  Labs (all labs ordered are listed, but only abnormal results are displayed) Labs Reviewed  CBC WITH DIFFERENTIAL/PLATELET - Abnormal; Notable  for the following:       Result Value   RBC 2.40 (*)    Hemoglobin 7.1 (*)    HCT 23.1 (*)    RDW 16.3 (*)    All other components within normal limits  BASIC METABOLIC PANEL - Abnormal; Notable for the following:    Glucose, Bld 146 (*)    BUN 33 (*)    Creatinine, Ser 1.27 (*)    Calcium 8.4 (*)    GFR calc non Af Amer 38 (*)    GFR calc Af Amer 45 (*)    All other components within normal limits  BRAIN NATRIURETIC PEPTIDE - Abnormal; Notable for the following:    B Natriuretic Peptide 290.7 (*)    All other components within normal limits  POC OCCULT BLOOD, ED - Abnormal; Notable for the following:    Fecal Occult Bld POSITIVE (*)    All other components within normal limits  TYPE AND SCREEN  PREPARE RBC (CROSSMATCH)  ABO/RH    EKG  EKG Interpretation None       Radiology Dg Chest 2 View  Result Date: 10/19/2016 CLINICAL DATA:  Chronic congestive heart failure. EXAM: CHEST  2 VIEW COMPARISON:  Radiographs of December 17, 2015. FINDINGS: Stable cardiomegaly and central pulmonary vascular congestion is noted. Atherosclerosis of thoracic aorta is noted. No pneumothorax is noted. Minimal left pleural effusion is noted. No consolidative process is noted. Bony thorax is unremarkable. IMPRESSION: Stable cardiomegaly and central pulmonary vascular congestion. Aortic atherosclerosis. Minimal left pleural effusion. Electronically Signed   By: Marijo Conception, M.D.   On: 10/19/2016 14:24    Procedures Procedures (including critical care time)  Medications Ordered in ED Medications  0.9 %  sodium chloride infusion (not administered)  hydrALAZINE (APRESOLINE) injection 10 mg (not administered)     Initial Impression / Assessment and Plan / ED Course  I have reviewed the triage vital signs and the nursing notes.  Pertinent labs & imaging results that were available during my care of the patient were reviewed by me and considered in my medical decision making (see chart for  details).     Patient with hx of chronic blood loss anemia due to upper and lower GIB due to multiple AVMs present for abnormal lab at PCP's office yesterday.  Her hgb found to be 7.2.  She is asymptomatic.  No gross blood on exam.  Guaiac positive stool.  Hgb today 7.1.  Slightly elevated BUN/Cr.  Will give 1U blood in ED.  Slighlty elevated BNP and small pleural effusion on yesterday's CXR, she is not tachypneic, hypoxic, or SOB.  2+ pitting edema bilaterally, she does not appear grossly volume overloaded.  Concern for continuing blood loss.  Patient with multiple comorbidities (age, CAD, DM, HTN) and will need admission for further management and possible GI consult.  Appreciate TRH.  Final Clinical Impressions(s) / ED Diagnoses   Final diagnoses:  Iron deficiency anemia due to chronic blood loss  Fecal occult blood test positive    New Prescriptions Current Discharge Medication List       Vivien Rossetti 10/20/16 2118    Forde Dandy, MD 10/21/16 [id number]    Forde Dandy, MD 10/21/16 [phone]

## 2016-10-20 NOTE — ED Triage Notes (Signed)
Patient sent from Dr. Laverta [place]. Patient had blood drawn yesterday, and per PCP patient Hgb 7.2 and BNP 457.5. Patient reports swelling to LLE. Hx CHF. Denies any complaints. Ambulatory to triage.

## 2016-10-20 NOTE — Progress Notes (Signed)
Patient's b/p was 181/58. PCP on call was paged.

## 2016-10-20 NOTE — ED Notes (Signed)
BEDSIDE REPORT

## 2016-10-21 ENCOUNTER — Observation Stay (HOSPITAL_COMMUNITY): Payer: Medicare HMO

## 2016-10-21 DIAGNOSIS — E785 Hyperlipidemia, unspecified: Secondary | ICD-10-CM

## 2016-10-21 DIAGNOSIS — K922 Gastrointestinal hemorrhage, unspecified: Secondary | ICD-10-CM

## 2016-10-21 DIAGNOSIS — D649 Anemia, unspecified: Secondary | ICD-10-CM

## 2016-10-21 DIAGNOSIS — I428 Other cardiomyopathies: Secondary | ICD-10-CM

## 2016-10-21 DIAGNOSIS — E1169 Type 2 diabetes mellitus with other specified complication: Secondary | ICD-10-CM | POA: Diagnosis not present

## 2016-10-21 DIAGNOSIS — E1159 Type 2 diabetes mellitus with other circulatory complications: Secondary | ICD-10-CM | POA: Diagnosis not present

## 2016-10-21 DIAGNOSIS — E8770 Fluid overload, unspecified: Secondary | ICD-10-CM | POA: Diagnosis not present

## 2016-10-21 DIAGNOSIS — E118 Type 2 diabetes mellitus with unspecified complications: Secondary | ICD-10-CM

## 2016-10-21 DIAGNOSIS — I10 Essential (primary) hypertension: Secondary | ICD-10-CM

## 2016-10-21 LAB — VITAMIN B12: VITAMIN B 12: 202 pg/mL (ref 180–914)

## 2016-10-21 LAB — BASIC METABOLIC PANEL
ANION GAP: 8 (ref 5–15)
BUN: 35 mg/dL — ABNORMAL HIGH (ref 6–20)
CO2: 27 mmol/L (ref 22–32)
Calcium: 8.2 mg/dL — ABNORMAL LOW (ref 8.9–10.3)
Chloride: 107 mmol/L (ref 101–111)
Creatinine, Ser: 1.12 mg/dL — ABNORMAL HIGH (ref 0.44–1.00)
GFR calc Af Amer: 52 mL/min — ABNORMAL LOW (ref >60)
GFR calc non Af Amer: 45 mL/min — ABNORMAL LOW (ref >60)
GLUCOSE: 107 mg/dL — AB (ref 65–99)
POTASSIUM: 3.6 mmol/L (ref 3.5–5.1)
Sodium: 142 mmol/L (ref 135–145)

## 2016-10-21 LAB — CBC
HCT: 32.1 % — ABNORMAL LOW (ref 36.0–46.0)
HEMATOCRIT: 23.5 % — AB (ref 36.0–46.0)
HEMOGLOBIN: 7.5 g/dL — AB (ref 12.0–15.0)
HEMOGLOBIN: 10.4 g/dL — AB (ref 12.0–15.0)
MCH: 30 pg (ref 26.0–34.0)
MCH: 30 pg (ref 26.0–34.0)
MCHC: 31.9 g/dL (ref 30.0–36.0)
MCHC: 32.4 g/dL (ref 30.0–36.0)
MCV: 94 fL (ref 78.0–100.0)
MCV: 92.5 fL (ref 78.0–100.0)
PLATELETS: 246 10*3/uL (ref 150–400)
Platelets: 205 10*3/uL (ref 150–400)
RBC: 2.5 MIL/uL — ABNORMAL LOW (ref 3.87–5.11)
RBC: 3.47 MIL/uL — AB (ref 3.87–5.11)
RDW: 15.9 % — ABNORMAL HIGH (ref 11.5–15.5)
RDW: 16 % — ABNORMAL HIGH (ref 11.5–15.5)
WBC: 4.2 10*3/uL (ref 4.0–10.5)
WBC: 4.9 10*3/uL (ref 4.0–10.5)

## 2016-10-21 LAB — PREPARE RBC (CROSSMATCH)

## 2016-10-21 LAB — GLUCOSE, CAPILLARY
GLUCOSE-CAPILLARY: 156 mg/dL — AB (ref 65–99)
Glucose-Capillary: 116 mg/dL — ABNORMAL HIGH (ref 65–99)

## 2016-10-21 MED ORDER — INSULIN ASPART 100 UNIT/ML ~~LOC~~ SOLN: 0.0000 [IU] | Freq: Three times a day (TID) | SUBCUTANEOUS | Status: DC

## 2016-10-21 MED ORDER — FERROUS SULFATE 325 (65 FE) MG PO TABS
325.0000 mg | ORAL_TABLET | Freq: Three times a day (TID) | ORAL | Status: DC
  Administered 2016-10-21 (×2): 325 mg via ORAL
  Filled 2016-10-21 (×3): qty 1

## 2016-10-21 MED ORDER — SODIUM CHLORIDE 0.9 % IV SOLN
Freq: Once | INTRAVENOUS | Status: AC
  Administered 2016-10-21: 13:00:00 via INTRAVENOUS

## 2016-10-21 MED ORDER — FUROSEMIDE 10 MG/ML IJ SOLN: 40.0000 mg | Freq: Every day | INTRAMUSCULAR | Status: DC

## 2016-10-21 MED ORDER — FUROSEMIDE 20 MG PO TABS
20.0000 mg | ORAL_TABLET | Freq: Every day | ORAL | Status: DC
  Administered 2016-10-21: 20 mg via ORAL
  Filled 2016-10-21: qty 1

## 2016-10-21 MED ORDER — FERROUS SULFATE 325 (65 FE) MG PO TABS: 325.0000 mg | ORAL_TABLET | Freq: Three times a day (TID) | ORAL | 0 refills | Status: DC

## 2016-10-21 MED ORDER — SODIUM CHLORIDE 0.9 % IV SOLN
Freq: Once | INTRAVENOUS | Status: AC
  Administered 2016-10-21: 03:00:00 via INTRAVENOUS

## 2016-10-21 NOTE — Care Management Note (Signed)
Case Management Note  Patient Details  Name: Connie [name] MRN: [id number] Date of Birth: 19-Oct-1933  Subjective/Objective:  83 y/o f admitted w/Acute GIB. From home. No CM needs.                  Action/Plan:d/c home.   Expected Discharge Date:  10/21/16               Expected Discharge Plan:  Home/Self Care  In-House Referral:     Discharge planning Services  CM Consult  Post Acute Care Choice:    Choice offered to:     DME Arranged:    DME Agency:     HH Arranged:    HH Agency:     Status of Service:  Completed, signed off  If discussed at [employer] of Stay Meetings, dates discussed:    Additional Comments:  Dessa Phi, RN 10/21/2016, 11:01 AM

## 2016-10-21 NOTE — Discharge Summary (Signed)
Physician Discharge Summary  Connie [name] [id number] DOB: [birth year] DOA: 10/20/2016  PCP: Wyatt Haste, MD  Admit date: 10/20/2016 Discharge date: 10/21/2016  Admitted From: home Disposition:  Home  Recommendations for Outpatient Follow-up:  1. Follow up with [facility] in 1-2 weeks 2. Please obtain BMP/CBC in one week.  Home Health:No Equipment/Devices:none  Discharge Condition:stable CODE STATUS:full Diet recommendation: Heart Healthy  Brief/Interim Summary: Admitting M.D.:   83 y.o. female with a hx of systolic HF 2/2 NICM, chronic anemia in the setting of gastric AVMs, DM, hypothyroidism who was referred to the ER with patient's primary care physician as patient's routine blood workup showed low hemoglobin. Patient's hemoglobin is usually around 10 and today it was found to be around 7. In the ER patient's stool for occult blood was positive.   Discharge Diagnoses:  Principal Problem:   Acute GI bleeding Active Problems:   Hypertension associated with diabetes ([place])   Hypothyroidism   Hyperlipidemia associated with type 2 diabetes mellitus ([name])   Type 2 diabetes with complication ([name])   NICM (nonischemic cardiomyopathy) ([name])   Anemia   GI bleeding   Fluid overload  Acute GI bleeding: Patient is status post 1 unit of packed blood cells. Spoke with Dr. Sandi Mealy recommended to keep hemoglobin above 7 and he will follow-up in 2 weeks an outpatient. Stools are positive for occult blood.  He received 2 units of packed red blood cells CBC as an outpatient.  Normocytic anemia: RDW is mildly high MCV is borderline high.  Lower extremity edema: She is on Norvasc and Actos which can produce this. We'll resume home dose of Lasix.    NICM (nonischemic cardiomyopathy) ([place]): With her last echo on 2017 that showed an EF of 35% with diffuse hypokinesia: Continuing Entresto, Coreg and Lasix.  Hypertension associated with diabetes ([place]) Continue current regimen  appear  Hypothyroidism Continue Synthroid.  Hyperlipidemia associated with type 2 diabetes mellitus ([name])/Type 2 diabetes with complication ([name]) No changes made to her regimen.   Discharge Instructions  Discharge Instructions    Diet - low sodium heart healthy    Complete by:  As directed    Increase activity slowly    Complete by:  As directed      Allergies as of 10/21/2016   No Known Allergies     Medication List    TAKE these medications   amLODipine 10 MG tablet Commonly known as:  NORVASC Take 1 tablet by mouth daily.   amoxicillin-clavulanate 875-125 MG tablet Commonly known as:  AUGMENTIN Take 1 tablet by mouth 2 (two) times daily.   atorvastatin 40 MG tablet Commonly known as:  LIPITOR TAKE ONE TABLET BY MOUTH ONCE DAILY   carvedilol 6.25 MG tablet Commonly known as:  COREG Take 1 tablet (6.25 mg total) by mouth 2 (two) times daily with a meal.   ferrous sulfate 325 (65 FE) MG tablet Take 1 tablet (325 mg total) by mouth 3 (three) times daily with meals.   furosemide 20 MG tablet Commonly known as:  LASIX TAKE 1 TABLET EVERY DAY What changed:  See the new instructions.   levothyroxine 125 MCG tablet Commonly known as:  SYNTHROID, LEVOTHROID TAKE 1 TABLET EVERY DAY What changed:  See the new instructions.   pantoprazole 20 MG tablet Commonly known as:  PROTONIX Take 1 tablet (20 mg total) by mouth daily.   pioglitazone 30 MG tablet Commonly known as:  ACTOS Take 1 tablet (30 mg total) by mouth daily.   potassium  chloride 10 MEQ tablet Commonly known as:  K-DUR Take 1 tablet (10 mEq total) by mouth as directed. Take 10 meq twice a day every Mon, Wed and Fri's   sacubitril-valsartan 49-51 MG Commonly known as:  ENTRESTO Take 1 tablet by mouth 2 (two) times daily.   OCUVITE PRESERVISION PO Take 1 capsule by mouth 2 (two) times daily.   WOMENS MULTIVITAMIN PLUS PO Take 1 tablet by mouth daily.       No Known  Allergies  Consultations:  None   Procedures/Studies: Dg Chest 2 View  Result Date: 10/19/2016 CLINICAL DATA:  Chronic congestive heart failure. EXAM: CHEST  2 VIEW COMPARISON:  Radiographs of December 17, 2015. FINDINGS: Stable cardiomegaly and central pulmonary vascular congestion is noted. Atherosclerosis of thoracic aorta is noted. No pneumothorax is noted. Minimal left pleural effusion is noted. No consolidative process is noted. Bony thorax is unremarkable. IMPRESSION: Stable cardiomegaly and central pulmonary vascular congestion. Aortic atherosclerosis. Minimal left pleural effusion. Electronically Signed   By: Marijo Conception, M.D.   On: 10/19/2016 14:24   Dg Chest Port 1 View  Result Date: 10/21/2016 CLINICAL DATA:  Fluid overload EXAM: PORTABLE CHEST 1 VIEW COMPARISON:  10/19/2016 FINDINGS: Unchanged mild cardiomegaly. Moderate vascular and interstitial prominence, worsened. No confluent airspace opacity. No large effusion. IMPRESSION: Vascular and interstitial prominence consistent with congestive heart failure. No alveolar edema or large effusion. Electronically Signed   By: Andreas Newport M.D.   On: 10/21/2016 00:29     Subjective: No complaints happy that she is going home today.  Discharge Exam: Vitals:   10/21/16 0236 10/21/16 0431  BP: (!) 122/39 (!) 134/43  Pulse: 64   Resp: 16 16  Temp: 98 F (36.7 C) 97.9 F (36.6 C)   Vitals:   10/21/16 0011 10/21/16 0027 10/21/16 0236 10/21/16 0431  BP: 113/65 (!) 118/35 (!) 122/39 (!) 134/43  Pulse: 68 63 64   Resp: 16 18 16 16   Temp: 97.9 F (36.6 C) 98.1 F (36.7 C) 98 F (36.7 C) 97.9 F (36.6 C)  TempSrc:   Oral Oral  SpO2:      Weight:      Height:        General: Pt is alert, awake, not in acute distress Cardiovascular: RRR, S1/S2 +, no rubs, no gallops Respiratory: CTA bilaterally, no wheezing, no rhonchi Abdominal: Soft, NT, ND, bowel sounds + Extremities: no edema, no cyanosis    The results of  significant diagnostics from this hospitalization (including imaging, microbiology, ancillary and laboratory) are listed below for reference.     Microbiology: No results found for this or any previous visit (from the past 240 hour(s)).   Labs: BNP (last 3 results)  Recent Labs  12/20/15 1000 10/19/16 1147 10/20/16 1750  BNP 303.0* 457.5* [id number]*   Basic Metabolic Panel:  Recent Labs Lab 10/19/16 1147 10/20/16 1750 10/21/16 0503  NA 141 142 142  K 4.3 4.0 3.6  CL 108 108 107  CO2 25 25 27   GLUCOSE 109* 146* 107*  BUN 26* 33* 35*  CREATININE 1.21* 1.27* 1.12*  CALCIUM 8.4* 8.4* 8.2*   Liver Function Tests:  Recent Labs Lab 10/19/16 1147  AST 19  ALT 3*  ALKPHOS 56  BILITOT 0.5  PROT 6.4  ALBUMIN 3.5*   No results for input(s): LIPASE, AMYLASE in the last 168 hours. No results for input(s): AMMONIA in the last 168 hours. CBC:  Recent Labs Lab 10/19/16 1147 10/20/16 1750 10/21/16 0503  WBC 4.5 4.2 4.2  NEUTROABS 3,240 2.4  --   HGB 7.2* 7.1* 7.5*  HCT 23.3* 23.1* 23.5*  MCV 93.6 96.3 94.0  PLT 237 241 205   Cardiac Enzymes: No results for input(s): CKTOTAL, CKMB, CKMBINDEX, TROPONINI in the last 168 hours. BNP: Invalid input(s): POCBNP CBG:  Recent Labs Lab 10/21/16 0752  GLUCAP 116*   D-Dimer No results for input(s): DDIMER in the last 72 hours. Hgb A1c  Recent Labs  10/19/16 1315  HGBA1C 6.3   Lipid Profile  Recent Labs  10/19/16 1147  CHOL 160  HDL 57  LDLCALC 81  TRIG 109  CHOLHDL 2.8   Thyroid function studies  Recent Labs  10/19/16 1147  TSH 21.54*   Anemia work up No results for input(s): VITAMINB12, FOLATE, FERRITIN, TIBC, IRON, RETICCTPCT in the last 72 hours. Urinalysis    Component Value Date/Time   COLORURINE YELLOW 12/16/2015 2347   APPEARANCEUR CLEAR 12/16/2015 2347   LABSPEC 1.012 12/16/2015 2347   PHURINE 5.0 12/16/2015 2347   GLUCOSEU 100 (A) 12/16/2015 2347   HGBUR TRACE (A) 12/16/2015 2347    BILIRUBINUR NEGATIVE 12/16/2015 2347   BILIRUBINUR n 05/09/2015 1656   [place] 12/16/2015 2347   PROTEINUR 30 (A) 12/16/2015 2347   UROBILINOGEN negative 05/09/2015 1656   UROBILINOGEN 0.2 02/19/2015 1823   NITRITE NEGATIVE 12/16/2015 2347   LEUKOCYTESUR NEGATIVE 12/16/2015 2347   Sepsis Labs Invalid input(s): PROCALCITONIN,  WBC,  LACTICIDVEN Microbiology No results found for this or any previous visit (from the past 240 hour(s)).   Time coordinating discharge: Over 30 minutes  SIGNED:   Charlynne Cousins, MD  [facility] 10/21/2016, 10:12 AM Pager   If 7PM-7AM, please contact night-coverage [email or web address] Password [email or web address]

## 2016-10-21 NOTE — Progress Notes (Signed)
This note also relates to the following rows which could not be included:  Rate - Cannot attach notes to extension rows

## 2016-10-21 NOTE — Progress Notes (Addendum)
This note also relates to the following rows which could not be included: Rate - Cannot attach notes to extension rows  Blood bag was accidentally punctured. Blood returned to Blood Bank.  Got new unit of PRBC from Blood Bank

## 2016-10-22 LAB — TYPE AND SCREEN
Blood Product Expiration Date: 201802172359
Blood Product Expiration Date: 201802182359
Blood Product Expiration Date: 201802202359
ISSUE DATE / TIME: 201801312353
ISSUE DATE / TIME: 201802011241
ISSUE DATE / TIME: 201801312241
UNIT TYPE AND RH: 6200
UNIT TYPE AND RH: 6200
Unit Type and Rh: 6200

## 2016-10-22 LAB — FOLATE RBC
FOLATE, HEMOLYSATE: 406.5 ng/mL
Folate, RBC: 1659 ng/mL (ref >498)
HEMATOCRIT: 24.5 % — AB (ref 34.0–46.6)

## 2016-10-23 ENCOUNTER — Other Ambulatory Visit: Payer: Self-pay | Admitting: Family Medicine

## 2016-10-23 MED ORDER — FUROSEMIDE 40 MG PO TABS: 40.0000 mg | ORAL_TABLET | Freq: Every day | ORAL | 3 refills | Status: DC

## 2016-10-28 ENCOUNTER — Ambulatory Visit (INDEPENDENT_AMBULATORY_CARE_PROVIDER_SITE_OTHER): Payer: Medicare HMO | Admitting: Family Medicine

## 2016-10-28 ENCOUNTER — Encounter: Payer: Self-pay | Admitting: Family Medicine

## 2016-10-28 VITALS — BP 150/60 | HR 78 | Wt 125.0 lb

## 2016-10-28 DIAGNOSIS — K31811 Angiodysplasia of stomach and duodenum with bleeding: Secondary | ICD-10-CM | POA: Diagnosis not present

## 2016-10-28 DIAGNOSIS — I5022 Chronic systolic (congestive) heart failure: Secondary | ICD-10-CM

## 2016-10-28 DIAGNOSIS — D500 Iron deficiency anemia secondary to blood loss (chronic): Secondary | ICD-10-CM | POA: Diagnosis not present

## 2016-10-28 NOTE — Progress Notes (Signed)
   Subjective:    Patient ID: Connie [name], female    DOB: Jan 31, 1934, 83 y.o.   MRN: [id number]  HPI He is here for a recheck. She states that she is feeling quite well. On her last visit she was sent to the hospital for all of on her anemia and CHF. Presently she is taking Lasix and notes an improvement in her swelling as well as shortness of breath. Her last hemoglobin was 10.4. No chest pain, shortness of breath, DOE. She does have more energy.   Review of Systems     Objective:   Physical Exam Her tendon no distress. Review of the record shows a vitamin B12 low.       Assessment & Plan:  Chronic systolic CHF (congestive heart failure) ([name]) - Plan: CBC with Differential/Platelet, Comprehensive metabolic panel  Iron deficiency anemia due to chronic blood loss - Plan: CBC with Differential/Platelet  Angiodysplasia of stomach and duodenum with hemorrhage - Plan: CBC with Differential/Platelet Will do blood work on her next week. Encouraged her to increase her multivitamins to taking 2 per day. May need to supplement her vitamin B12.

## 2016-11-08 ENCOUNTER — Other Ambulatory Visit: Payer: Medicare HMO

## 2016-11-08 DIAGNOSIS — D500 Iron deficiency anemia secondary to blood loss (chronic): Secondary | ICD-10-CM | POA: Diagnosis not present

## 2016-11-08 DIAGNOSIS — K31811 Angiodysplasia of stomach and duodenum with bleeding: Secondary | ICD-10-CM

## 2016-11-08 DIAGNOSIS — I5022 Chronic systolic (congestive) heart failure: Secondary | ICD-10-CM

## 2016-11-08 LAB — COMPREHENSIVE METABOLIC PANEL
ALBUMIN: 4.1 g/dL (ref 3.6–5.1)
ALT: 6 U/L (ref 6–29)
AST: 16 U/L (ref 10–35)
Alkaline Phosphatase: 58 U/L (ref 33–130)
BUN: 35 mg/dL — ABNORMAL HIGH (ref 7–25)
CALCIUM: 9.1 mg/dL (ref 8.6–10.4)
CHLORIDE: 108 mmol/L (ref 98–110)
CO2: 26 mmol/L (ref 20–31)
Creat: 1.16 mg/dL — ABNORMAL HIGH (ref 0.60–0.88)
GLUCOSE: 162 mg/dL — AB (ref 65–99)
POTASSIUM: 3.8 mmol/L (ref 3.5–5.3)
Sodium: 143 mmol/L (ref 135–146)
Total Bilirubin: 0.5 mg/dL (ref 0.2–1.2)
Total Protein: 6.6 g/dL (ref 6.1–8.1)

## 2016-11-08 LAB — CBC WITH DIFFERENTIAL/PLATELET
BASOS ABS: 61 {cells}/uL (ref 0–200)
Basophils Relative: 1 %
Eosinophils Absolute: 183 cells/uL (ref 15–500)
Eosinophils Relative: 3 %
HEMATOCRIT: 33.7 % — AB (ref 35.0–45.0)
HEMOGLOBIN: 10.5 g/dL — AB (ref 11.7–15.5)
Lymphocytes Relative: 12 %
Lymphs Abs: 732 cells/uL — ABNORMAL LOW (ref 850–3900)
MCH: 29.2 pg (ref 27.0–33.0)
MCHC: 31.2 g/dL — AB (ref 32.0–36.0)
MCV: 93.6 fL (ref 80.0–100.0)
MONO ABS: 549 {cells}/uL (ref 200–950)
MPV: 11.1 fL (ref 7.5–12.5)
Monocytes Relative: 9 %
NEUTROS PCT: 75 %
Neutro Abs: 4575 cells/uL (ref 1500–7800)
Platelets: 389 10*3/uL (ref 140–400)
RBC: 3.6 MIL/uL — AB (ref 3.80–5.10)
RDW: 14.6 % (ref 11.0–15.0)
WBC: 6.1 10*3/uL (ref 4.0–10.5)

## 2016-11-09 ENCOUNTER — Other Ambulatory Visit: Payer: Self-pay

## 2016-11-09 DIAGNOSIS — D649 Anemia, unspecified: Secondary | ICD-10-CM

## 2016-11-11 ENCOUNTER — Telehealth: Payer: Self-pay | Admitting: Hematology

## 2016-11-11 NOTE — Telephone Encounter (Signed)
Confirmed 3/9 appt per LOS

## 2016-11-24 ENCOUNTER — Encounter (HOSPITAL_COMMUNITY): Payer: Self-pay | Admitting: Radiology

## 2016-11-24 ENCOUNTER — Ambulatory Visit (HOSPITAL_COMMUNITY): Payer: Medicare HMO | Attending: Cardiology

## 2016-11-25 ENCOUNTER — Other Ambulatory Visit: Payer: Self-pay | Admitting: Family Medicine

## 2016-11-25 DIAGNOSIS — E039 Hypothyroidism, unspecified: Secondary | ICD-10-CM

## 2016-11-25 NOTE — Telephone Encounter (Signed)
Renew her medicine and make sure she has a return appointment for follow-up TSH

## 2016-11-25 NOTE — Telephone Encounter (Signed)
Pt is due for repeat TSH per 09/2016 labs, do you want me to order and call pt?

## 2016-11-26 ENCOUNTER — Other Ambulatory Visit: Payer: Self-pay

## 2016-11-26 ENCOUNTER — Other Ambulatory Visit: Payer: Medicare HMO

## 2016-11-26 ENCOUNTER — Ambulatory Visit: Payer: Medicare HMO

## 2016-11-26 ENCOUNTER — Ambulatory Visit: Payer: Medicare HMO | Admitting: Hematology

## 2016-11-26 ENCOUNTER — Telehealth: Payer: Self-pay

## 2016-11-26 MED ORDER — FUROSEMIDE 40 MG PO TABS: 40.0000 mg | ORAL_TABLET | Freq: Every day | ORAL | 1 refills | Status: DC

## 2016-11-26 MED ORDER — AMLODIPINE BESYLATE 10 MG PO TABS: 10.0000 mg | ORAL_TABLET | Freq: Every day | ORAL | 1 refills | Status: DC

## 2016-11-26 NOTE — Telephone Encounter (Signed)
Sent refills to [facility]

## 2016-11-26 NOTE — Telephone Encounter (Signed)
Pt scheduled for tsh RECHECK.

## 2016-11-26 NOTE — Addendum Note (Signed)
Addended by: Arley Phenix L on: 11/26/2016 10:20 AM   Modules accepted: Orders

## 2016-11-26 NOTE — Telephone Encounter (Signed)
Faxed request from [facility] for pt's amlodipine, furosemide.

## 2016-12-01 ENCOUNTER — Encounter: Payer: Self-pay | Admitting: Hematology

## 2016-12-01 ENCOUNTER — Other Ambulatory Visit: Payer: Medicare HMO

## 2016-12-01 ENCOUNTER — Telehealth: Payer: Self-pay | Admitting: *Deleted

## 2016-12-01 ENCOUNTER — Telehealth: Payer: Self-pay | Admitting: Hematology

## 2016-12-01 ENCOUNTER — Other Ambulatory Visit (HOSPITAL_COMMUNITY)
Admission: AD | Admit: 2016-12-01 | Discharge: 2016-12-01 | Disposition: A | Discharge status: Home / Self Care | Payer: Medicare HMO | Source: Ambulatory Visit | Attending: Hematology | Admitting: Hematology

## 2016-12-01 ENCOUNTER — Telehealth: Payer: Self-pay

## 2016-12-01 ENCOUNTER — Ambulatory Visit (HOSPITAL_BASED_OUTPATIENT_CLINIC_OR_DEPARTMENT_OTHER): Payer: Medicare HMO | Admitting: Hematology

## 2016-12-01 ENCOUNTER — Encounter: Payer: Self-pay | Admitting: Cardiology

## 2016-12-01 ENCOUNTER — Other Ambulatory Visit (HOSPITAL_BASED_OUTPATIENT_CLINIC_OR_DEPARTMENT_OTHER): Payer: Medicare HMO

## 2016-12-01 ENCOUNTER — Ambulatory Visit: Payer: Medicare HMO

## 2016-12-01 ENCOUNTER — Ambulatory Visit (INDEPENDENT_AMBULATORY_CARE_PROVIDER_SITE_OTHER): Payer: Medicare HMO | Admitting: Cardiology

## 2016-12-01 ENCOUNTER — Other Ambulatory Visit: Payer: Self-pay | Admitting: *Deleted

## 2016-12-01 VITALS — BP 187/56 | HR 77 | Temp 98.1°F | Resp 18 | Wt 121.1 lb

## 2016-12-01 VITALS — BP 178/70 | HR 64 | Ht <= 58 in | Wt 120.0 lb

## 2016-12-01 DIAGNOSIS — I428 Other cardiomyopathies: Secondary | ICD-10-CM

## 2016-12-01 DIAGNOSIS — D500 Iron deficiency anemia secondary to blood loss (chronic): Secondary | ICD-10-CM

## 2016-12-01 DIAGNOSIS — R748 Abnormal levels of other serum enzymes: Secondary | ICD-10-CM

## 2016-12-01 DIAGNOSIS — K922 Gastrointestinal hemorrhage, unspecified: Secondary | ICD-10-CM

## 2016-12-01 DIAGNOSIS — E039 Hypothyroidism, unspecified: Secondary | ICD-10-CM

## 2016-12-01 DIAGNOSIS — D509 Iron deficiency anemia, unspecified: Secondary | ICD-10-CM

## 2016-12-01 DIAGNOSIS — K31811 Angiodysplasia of stomach and duodenum with bleeding: Secondary | ICD-10-CM | POA: Diagnosis not present

## 2016-12-01 DIAGNOSIS — R7989 Other specified abnormal findings of blood chemistry: Secondary | ICD-10-CM

## 2016-12-01 DIAGNOSIS — I10 Essential (primary) hypertension: Secondary | ICD-10-CM | POA: Diagnosis not present

## 2016-12-01 DIAGNOSIS — D649 Anemia, unspecified: Secondary | ICD-10-CM

## 2016-12-01 DIAGNOSIS — E1159 Type 2 diabetes mellitus with other circulatory complications: Secondary | ICD-10-CM | POA: Diagnosis not present

## 2016-12-01 DIAGNOSIS — I5022 Chronic systolic (congestive) heart failure: Secondary | ICD-10-CM

## 2016-12-01 DIAGNOSIS — R778 Other specified abnormalities of plasma proteins: Secondary | ICD-10-CM

## 2016-12-01 LAB — FERRITIN: FERRITIN: 51 ng/mL (ref 9–269)

## 2016-12-01 LAB — CBC & DIFF AND RETIC
BASO%: 0.3 % (ref 0.0–2.0)
BASOS ABS: 0 10*3/uL (ref 0.0–0.1)
EOS ABS: 0.3 10*3/uL (ref 0.0–0.5)
EOS%: 3.5 % (ref 0.0–7.0)
HCT: 37.2 % (ref 34.8–46.6)
HEMOGLOBIN: 11.9 g/dL (ref 11.6–15.9)
Immature Retic Fract: 4.3 % (ref 1.60–10.00)
LYMPH%: 7.8 % — ABNORMAL LOW (ref 14.0–49.7)
MCH: 29.4 pg (ref 25.1–34.0)
MCHC: 32 g/dL (ref 31.5–36.0)
MCV: 91.9 fL (ref 79.5–101.0)
MONO#: 0.8 10*3/uL (ref 0.1–0.9)
MONO%: 10.7 % (ref 0.0–14.0)
NEUT%: 77.7 % — ABNORMAL HIGH (ref 38.4–76.8)
NEUTROS ABS: 5.9 10*3/uL (ref 1.5–6.5)
NRBC: 0 % (ref 0–0)
Platelets: 347 10*3/uL (ref 145–400)
RBC: 4.05 10*6/uL (ref 3.70–5.45)
RDW: 13.7 % (ref 11.2–14.5)
Retic %: 1.19 % (ref 0.70–2.10)
Retic Ct Abs: 48.2 10*3/uL (ref 33.70–90.70)
WBC: 7.7 10*3/uL (ref 3.9–10.3)
lymph#: 0.6 10*3/uL — ABNORMAL LOW (ref 0.9–3.3)

## 2016-12-01 LAB — HEPATIC FUNCTION PANEL
ALBUMIN: 4 g/dL (ref 3.5–5.0)
ALT: 8 U/L — AB (ref 14–54)
AST: 19 U/L (ref 15–41)
Alkaline Phosphatase: 65 U/L (ref 38–126)
Bilirubin, Direct: <0.1 mg/dL — ABNORMAL LOW (ref 0.1–0.5)
TOTAL PROTEIN: 7 g/dL (ref 6.5–8.1)
Total Bilirubin: 0.7 mg/dL (ref 0.3–1.2)

## 2016-12-01 LAB — BASIC METABOLIC PANEL
Anion Gap: 12 mEq/L — ABNORMAL HIGH (ref 3–11)
BUN: 29.1 mg/dL — ABNORMAL HIGH (ref 7.0–26.0)
CHLORIDE: 107 meq/L (ref 98–109)
CO2: 27 meq/L (ref 22–29)
CREATININE: 1.2 mg/dL — AB (ref 0.6–1.1)
Calcium: 9.2 mg/dL (ref 8.4–10.4)
EGFR: 43 mL/min/{1.73_m2} — ABNORMAL LOW (ref >90)
Glucose: 173 mg/dl — ABNORMAL HIGH (ref 70–140)
POTASSIUM: 3.4 meq/L — AB (ref 3.5–5.1)
SODIUM: 146 meq/L — AB (ref 136–145)

## 2016-12-01 LAB — TSH: TSH: 0.856 m[IU]/L (ref 0.308–3.960)

## 2016-12-01 MED ORDER — CARVEDILOL 12.5 MG PO TABS
12.5000 mg | ORAL_TABLET | Freq: Two times a day (BID) | ORAL | 3 refills | Status: DC
Start: 2016-12-01 — End: 2017-12-11

## 2016-12-01 NOTE — Telephone Encounter (Signed)
Pt stopped by the office to let us know she just had labs drawn at Dr. Grier Mitts office and had BMP, CBC, and Ferritin drawn. She stopped by our office to have additional labs drawn that you ordered (CMP, CBC, and TSH).   I called Dr. Grier Mitts office and they added on the [facility] and Hepatic Function Panel to get the results for you, so that patient didn't need to stuck twice and prevent duplication of services. Can you please review these labs, under lab tab.  Thank you, Wells Guiles

## 2016-12-01 NOTE — Patient Instructions (Signed)
Medication Instructions:  Please increase your Carvedilol to 12.5 mg twice a day. Continue all other medications as listed.  Follow-Up: Follow up in 6 months with Cecilie Kicks, NP.  You will receive a letter in the mail 2 months before you are due.  Please call us when you receive this letter to schedule your follow up appointment.  If you need a refill on your cardiac medications before your next appointment, please call your pharmacy.  Thank you for choosing [facility]!!

## 2016-12-01 NOTE — Progress Notes (Signed)
Connie [name]    HEMATOLOGY/ONCOLOGY CLINIC NOTE  Date of Service: .12/01/2016  Patient Care Team: [name], MD as PCP - General (Family Medicine)  CHIEF COMPLAINTS/PURPOSE OF CONSULTATION:  Iron deficiency anemia due to GI bleeding from multiple AVMs  DIAGNOSIS: Iron deficiency anemia likely related to ongoing chronic GI bleeding from multiple AVMs  TREATMENT _IV iron replacement with injectafer as needed (tolerated well). (Had some shortness of breath with IV Feraheme - which was likely fluid overload as opposed to a true allergic reaction but patient has been hesitant to take this)  HISTORY OF PRESENTING ILLNESS:  Please see previous note for details  INTERVAL HISTORY  [name] is here for followup for her IDA. She notes that she received 2 units of PRBCs in January. Labs today show a hemoglobin of 11.9 but her ferritin level is down to 51 from a previous level of 437 suggesting ongoing GI losses. No overt GI bleeding reported. Has been taking ferrous sulfate 1 tablet by mouth 3 times a day.   MEDICAL HISTORY:  Past Medical History:  Diagnosis Date  . Allergy    RHINITIS  . Angiodysplasia of duodenum    hx/notes 11/21/2015  . Angiodysplasia of stomach    hx/notes 11/21/2015  . Arthritis    "joints ache"  . Bleeding gastric ulcer    hx/notes 11/21/2015  . Chronic blood loss anemia    secondary to angiodysplasia of the stomach and duodenum as well as history of bleeding gastric ulcer hx/notes 11/21/2015  . Chronic systolic CHF (congestive heart failure) ([place]) 12/23/2015  . History of blood transfusion 01/2015; 06/2015; 11/21/2015  . Hypercholesteremia   . Hypertension   . Hypothyroidism   . Obesity   . Pneumonia "several times"  . Type II diabetes mellitus ([place])     SURGICAL HISTORY: Past Surgical History:  Procedure Laterality Date  . CARDIAC CATHETERIZATION N/A 12/19/2015   Procedure: Left Heart Cath and Coronary Angiography;  Surgeon: Leonie Man, MD;  Location: [facility];  Service: Cardiovascular;  Laterality: N/A;  . COLONOSCOPY N/A 08/30/2014   Procedure: COLONOSCOPY;  Surgeon: Beryle Beams, MD;  Location: [facility] ENDOSCOPY;  Service: Endoscopy;  Laterality: N/A;  . COLONOSCOPY N/A 02/21/2015   Procedure: COLONOSCOPY;  Surgeon: Carol Ada, MD;  Location: [facility] ENDOSCOPY;  Service: Endoscopy;  Laterality: N/A;  . ENTEROSCOPY N/A 06/06/2015   Procedure: ENTEROSCOPY;  Surgeon: Carol Ada, MD;  Location: [facility] ENDOSCOPY;  Service: Endoscopy;  Laterality: N/A;  . FRACTURE SURGERY    . GIVENS CAPSULE STUDY N/A 05/15/2015   Procedure: GIVENS CAPSULE STUDY;  Surgeon: Carol Ada, MD;  Location: [facility] ENDOSCOPY;  Service: Endoscopy;  Laterality: N/A;  . HOT HEMOSTASIS N/A 08/30/2014   Procedure: HOT HEMOSTASIS (ARGON PLASMA COAGULATION/BICAP);  Surgeon: Beryle Beams, MD;  Location: [facility] ENDOSCOPY;  Service: Endoscopy;  Laterality: N/A;  . HOT HEMOSTASIS N/A 06/06/2015   Procedure: HOT HEMOSTASIS (ARGON PLASMA COAGULATION/BICAP);  Surgeon: Carol Ada, MD;  Location: [facility] ENDOSCOPY;  Service: Endoscopy;  Laterality: N/A;  . JOINT REPLACEMENT    . REVISION TOTAL KNEE ARTHROPLASTY Bilateral 2004-2015  . SHOULDER OPEN ROTATOR CUFF REPAIR Right 12/2004   open subacromial decompression, distal clavicle  resection, rotator cuff repair/notes 02/02/2011  . TONSILLECTOMY  1940s  . TOTAL KNEE ARTHROPLASTY Bilateral ~ 2002  . TOTAL THYROIDECTOMY  ~ 1966    SOCIAL HISTORY: Social History   Social History  . Marital status: Married    Spouse name: N/A  . Number  of children: N/A  . Years of education: N/A   Occupational History  . Not on file.   Social History Main Topics  . Smoking status: Never Smoker  . Smokeless tobacco: Never Used  . Alcohol use No  . Drug use: No  . Sexual activity: Not Currently   Other Topics Concern  . Not on file   Social History Narrative  . No narrative on file    FAMILY HISTORY: Family History  Problem Relation Age of Onset   . Asthma Mother   . Ulcers Father   . Anemia Sister     ALLERGIES:  has No Known Allergies.  MEDICATIONS:  Current Outpatient Prescriptions  Medication Sig Dispense Refill  . amLODipine (NORVASC) 10 MG tablet Take 1 tablet (10 mg total) by mouth daily. 90 tablet 1  . atorvastatin (LIPITOR) 40 MG tablet TAKE ONE TABLET BY MOUTH ONCE DAILY 90 tablet 3  . ferrous sulfate 325 (65 FE) MG tablet Take 1 tablet (325 mg total) by mouth 3 (three) times daily with meals. 90 tablet 0  . furosemide (LASIX) 40 MG tablet Take 1 tablet (40 mg total) by mouth daily. 90 tablet 1  . Multiple Vitamins-Minerals (OCUVITE PRESERVISION PO) Take 1 capsule by mouth 2 (two) times daily.    . Multiple Vitamins-Minerals (WOMENS MULTIVITAMIN PLUS PO) Take 1 tablet by mouth daily.    . pantoprazole (PROTONIX) 20 MG tablet Take 1 tablet (20 mg total) by mouth daily. 90 tablet 3  . pioglitazone (ACTOS) 30 MG tablet Take 1 tablet (30 mg total) by mouth daily. 90 tablet 1  . sacubitril-valsartan (ENTRESTO) 49-51 MG Take 1 tablet by mouth 2 (two) times daily. 180 tablet 3  . carvedilol (COREG) 12.5 MG tablet Take 1 tablet (12.5 mg total) by mouth 2 (two) times daily with a meal. 180 tablet 3  . levothyroxine (SYNTHROID, LEVOTHROID) 125 MCG tablet Take 125 mcg by mouth daily before breakfast.    . potassium chloride (K-DUR) 10 MEQ tablet Take 1 tablet (10 mEq total) by mouth as directed. Take 10 meq twice a day every Mon, Wed and Fri's     No current facility-administered medications for this visit.     REVIEW OF SYSTEMS:    10 Point review of Systems was done is negative except as noted above.  PHYSICAL EXAMINATION: ECOG PERFORMANCE STATUS: 1 - Symptomatic but completely ambulatory  . Vitals:   12/01/16 0839  BP: (!) 187/56  Pulse: 77  Resp: 18  Temp: 98.1 F (36.7 C)   Filed Weights   12/01/16 0839  Weight: 121 lb 1.6 oz (54.9 kg)   .Body mass index is 25.31 kg/m.  GENERAL:alert, in no acute distress  and comfortable SKIN: skin color, texture, turgor are normal, no rashes or significant lesions EYES: normal, conjunctiva are pink and non-injected, sclera clear OROPHARYNX:no exudate, no erythema and lips, buccal mucosa, and tongue normal  NECK: supple, no JVD, thyroid normal size, non-tender, without nodularity LYMPH:  no palpable lymphadenopathy in the cervical, axillary or inguinal LUNGS: clear to auscultation with normal respiratory effort HEART: regular rate & rhythm,  no murmurs and no lower extremity edema ABDOMEN: abdomen soft, non-tender, normoactive bowel sounds ,  no palpable hepatosplenomegaly Musculoskeletal: no cyanosis of digits and no clubbing  PSYCH: alert & oriented x 3 with fluent speech NEURO: no focal motor/sensory deficits  LABORATORY DATA:   . CBC Latest Ref Rng & Units 12/01/2016 11/08/2016 10/21/2016  WBC 3.9 - 10.3 10e3/uL 7.7 6.1  4.9  Hemoglobin 11.6 - 15.9 g/dL 11.9 10.5(L) 10.4(L)  Hematocrit 34.8 - 46.6 % 37.2 33.7(L) 32.1(L)  Platelets 145 - 400 10e3/uL 347 389 246    . CBC    Component Value Date/Time   WBC 7.7 12/01/2016 0821   WBC 6.1 11/08/2016 1355   RBC 4.05 12/01/2016 0821   RBC 3.60 (L) 11/08/2016 1355   HGB 11.9 12/01/2016 0821   HCT 37.2 12/01/2016 0821   PLT 347 12/01/2016 0821   MCV 91.9 12/01/2016 0821   MCH 29.4 12/01/2016 0821   MCH 29.2 11/08/2016 1355   MCHC 32.0 12/01/2016 0821   MCHC 31.2 (L) 11/08/2016 1355   RDW 13.7 12/01/2016 0821   LYMPHSABS 0.6 (L) 12/01/2016 0821   MONOABS 0.8 12/01/2016 0821   EOSABS 0.3 12/01/2016 0821   BASOSABS 0.0 12/01/2016 0821    . CMP Latest Ref Rng & Units 12/01/2016 11/08/2016 10/21/2016  Glucose 70 - 140 mg/dl 173(H) 162(H) 107(H)  BUN 7.0 - 26.0 mg/dL 29.1(H) 35(H) 35(H)  Creatinine 0.6 - 1.1 mg/dL 1.2(H) 1.16(H) 1.12(H)  Sodium 136 - 145 mEq/L 146(H) 143 142  Potassium 3.5 - 5.1 mEq/L 3.4(L) 3.8 3.6  Chloride 98 - 110 mmol/L - 108 107  CO2 22 - 29 mEq/L 27 26 27   Calcium 8.4 - 10.4  mg/dL 9.2 9.1 8.2(L)  Total Protein 6.5 - 8.1 g/dL 7.0 6.6 -  Total Bilirubin 0.3 - 1.2 mg/dL 0.7 0.5 -  Alkaline Phos 38 - 126 U/L 65 58 -  AST 15 - 41 U/L 19 16 -  ALT 14 - 54 U/L 8(L) 6 -    Lab Results  Component Value Date   FERRITIN 51 12/01/2016     ASSESSMENT & PLAN:   83 year old Caucasian female with  #1 Microcytic anemia likely related to iron deficiency from chronic GI bleeding. No known disorder causing chronic inflammation at this time. Patient's hemoglobin is okay at 11.9 But her ferritin has progressively dropped and is down to 51.  #2 history of recurrent GI bleeding related to upper and lower GI tract AVMs and gastric erosions. Has had several admissions with requirement of multiple PRBC transfusions. Dropping ferritin suggests ongoing slow losses. Hemoglobin is relatively stable. No clinically overt GI bleeding noted.  #3 status post acute renal failure in January 2017 currently creatinine within normal limits-  #4 hypothyroidism on levothyroxine .  #5 ?? Reaction to [facility] - current hospital admission that shortness of breath appears to be more likely related to CHF decompensation than allergic reaction to Feraheme. However patient is very anxious and hesitant to get this preparation.  Plan  -patient is currently off ASA to try to reduce her risk of GI bleeding though she continues to remain at high-risk for GI bleeding given her extensive AVMs. We shall treat her with IV Injectafer weekly x 2 doses  -we will continue to f/u for  chronic blood loss anemia which would be treated with aggressive IV iron replacement to maintain ferritin levels of >100. -continue daily multivitamin orally. -may hold po iron -Continue followup with GI for further evaluation and management of AVMs as needed. -Continue close followup with primary care physician for management of other medical issues. (HTN control)  IV injectafer weekly x 2 doses Return to care with Dr. [name]  in 3 months with repeat CBC, CMP and ferritin to reassess status of anemia.  All of the patients questions were answered with apparent satisfaction. The patient knows to call the clinic with  any problems, questions or concerns.  I spent 20 minutes counseling the patient face to face. The total time spent in the appointment was 20 minutes and more than 50% was on counseling and direct patient cares.    Sullivan Lone MD [place] AAHIVMS [facility] Hematology/Oncology Physician [facility]  (Office):       [phone] (Work cell):  [phone] (Fax):           [phone]

## 2016-12-01 NOTE — Progress Notes (Signed)
Cardiology Office Note    Date:  12/01/2016   ID:  Connie [name], DOB October 31, 1933, MRN [id number]  PCP:  Wyatt Haste, MD  Cardiologist:   Candee Furbish, MD     History of Present Illness:  Connie [name] is a 83 y.o. female with nonischemic cardiomyopathy admitted in March 2017 with acute heart failure exacerbation. Had AVMs, chronic anemia. She had transient BiPAP use. Her ejection fraction on 12/17/15 was 35-40% with mild mitral regurgitation.  Husband died 09/02/2016. Vic.   Not driving, right eye vision loss. Son helps her out. Lives in [place]. No orthopnea, no CP, no SOB.   Unable to take aspirin and Plavix because of gastric AVMs. Thankfully, diagnostic cardiac catheterization performed in March 2017 showed no coronary disease. Troponin was minimally elevated secondary to heart failure at 0.12.  Cancer center checks blood work. IV iron. Afraid of it.   Left LE edema late in the day. Fairly chronic.  She and her husband have been married for 60 years.  Has been doing fairly well, no shortness of breath, no chest pain, no orthopnea. Maintaining body weight.Her last hemoglobin was 10.4. Lasix seems to help with swelling as well as dyspnea. I reviewed prior note from Dr. Jill Alexanders from office visit on 10/28/16.   Past Medical History:  Diagnosis Date  . Allergy    RHINITIS  . Angiodysplasia of duodenum    hx/notes 11/21/2015  . Angiodysplasia of stomach    hx/notes 11/21/2015  . Arthritis    "joints ache"  . Bleeding gastric ulcer    hx/notes 11/21/2015  . Chronic blood loss anemia    secondary to angiodysplasia of the stomach and duodenum as well as history of bleeding gastric ulcer hx/notes 11/21/2015  . Chronic systolic CHF (congestive heart failure) ([place]) 12/23/2015  . History of blood transfusion 01/2015; 06/2015; 11/21/2015  . Hypercholesteremia   . Hypertension   . Hypothyroidism   . Obesity   . Pneumonia "several times"  . Type II diabetes  mellitus ([place])     Past Surgical History:  Procedure Laterality Date  . CARDIAC CATHETERIZATION N/A 12/19/2015   Procedure: Left Heart Cath and Coronary Angiography;  Surgeon: Leonie Man, MD;  Location: [facility];  Service: Cardiovascular;  Laterality: N/A;  . COLONOSCOPY N/A 08/30/2014   Procedure: COLONOSCOPY;  Surgeon: Beryle Beams, MD;  Location: [facility];  Service: Endoscopy;  Laterality: N/A;  . COLONOSCOPY N/A 02/21/2015   Procedure: COLONOSCOPY;  Surgeon: Carol Ada, MD;  Location: [facility];  Service: Endoscopy;  Laterality: N/A;  . ENTEROSCOPY N/A 06/06/2015   Procedure: ENTEROSCOPY;  Surgeon: Carol Ada, MD;  Location: [facility];  Service: Endoscopy;  Laterality: N/A;  . FRACTURE SURGERY    . GIVENS CAPSULE STUDY N/A 05/15/2015   Procedure: GIVENS CAPSULE STUDY;  Surgeon: Carol Ada, MD;  Location: [facility];  Service: Endoscopy;  Laterality: N/A;  . HOT HEMOSTASIS N/A 08/30/2014   Procedure: HOT HEMOSTASIS (ARGON PLASMA COAGULATION/BICAP);  Surgeon: Beryle Beams, MD;  Location: [facility];  Service: Endoscopy;  Laterality: N/A;  . HOT HEMOSTASIS N/A 06/06/2015   Procedure: HOT HEMOSTASIS (ARGON PLASMA COAGULATION/BICAP);  Surgeon: Carol Ada, MD;  Location: [facility];  Service: Endoscopy;  Laterality: N/A;  . JOINT REPLACEMENT    . REVISION TOTAL KNEE ARTHROPLASTY Bilateral 2004-2015  . SHOULDER OPEN ROTATOR CUFF REPAIR Right 12/2004   open subacromial decompression, distal clavicle  resection, rotator cuff repair/notes 02/02/2011  . TONSILLECTOMY  1940s  . TOTAL KNEE ARTHROPLASTY Bilateral ~ 2002  . TOTAL THYROIDECTOMY  ~ 1966    Current Medications: Outpatient Medications Prior to Visit  Medication Sig Dispense Refill  . amLODipine (NORVASC) 10 MG tablet Take 1 tablet (10 mg total) by mouth daily. 90 tablet 1  . atorvastatin (LIPITOR) 40 MG tablet TAKE ONE TABLET BY MOUTH ONCE DAILY 90 tablet 3  . ferrous sulfate 325 (65 FE) MG  tablet Take 1 tablet (325 mg total) by mouth 3 (three) times daily with meals. 90 tablet 0  . furosemide (LASIX) 40 MG tablet Take 1 tablet (40 mg total) by mouth daily. 90 tablet 1  . Multiple Vitamins-Minerals (OCUVITE PRESERVISION PO) Take 1 capsule by mouth 2 (two) times daily.    . Multiple Vitamins-Minerals (WOMENS MULTIVITAMIN PLUS PO) Take 1 tablet by mouth daily.    . pantoprazole (PROTONIX) 20 MG tablet Take 1 tablet (20 mg total) by mouth daily. 90 tablet 3  . pioglitazone (ACTOS) 30 MG tablet Take 1 tablet (30 mg total) by mouth daily. 90 tablet 1  . potassium chloride (K-DUR) 10 MEQ tablet Take 1 tablet (10 mEq total) by mouth as directed. Take 10 meq twice a day every Mon, Wed and Fri's    . sacubitril-valsartan (ENTRESTO) 49-51 MG Take 1 tablet by mouth 2 (two) times daily. 180 tablet 3  . carvedilol (COREG) 6.25 MG tablet Take 1 tablet (6.25 mg total) by mouth 2 (two) times daily with a meal. 180 tablet 3  . levothyroxine (SYNTHROID, LEVOTHROID) 125 MCG tablet TAKE 1 TABLET EVERY DAY 90 tablet 3   No facility-administered medications prior to visit.      Allergies:   Patient has no known allergies.   Social History   Social History  . Marital status: Married    Spouse name: N/A  . Number of children: N/A  . Years of education: N/A   Social History Main Topics  . Smoking status: Never Smoker  . Smokeless tobacco: Never Used  . Alcohol use No  . Drug use: No  . Sexual activity: Not Currently   Other Topics Concern  . None   Social History Narrative  . None     Family History:  The patient's family history includes Anemia in her sister; Asthma in her mother; Ulcers in her father.   ROS:   Please see the history of present illness.    ROS All other systems reviewed and are negative.   PHYSICAL EXAM:   VS:  BP (!) 178/70   Pulse 64   Ht 4\' 10"  (1.473 m)   Wt 120 lb (54.4 kg)   BMI 25.08 kg/m    GEN: Well nourished, well developed, in no acute distress    HEENT: normal  Neck: no JVD, carotid bruits, or masses Cardiac: RRR; no murmurs, rubs, or gallops,no edema  Respiratory:  clear to auscultation bilaterally, normal work of breathing GI: soft, nontender, nondistended, + BS MS: no deformity or atrophy  Skin: warm and dry, no rash Neuro:  Alert and Oriented x 3, Strength and sensation are intact Psych: euthymic mood, full affect  Wt Readings from Last 3 Encounters:  12/01/16 120 lb (54.4 kg)  12/01/16 121 lb 1.6 oz (54.9 kg)  10/28/16 125 lb (56.7 kg)      Studies/Labs Reviewed:   EKG:  None today.   Recent Labs: 12/20/2015: Magnesium 2.0 10/19/2016: TSH 21.54 10/20/2016: B Natriuretic Peptide 290.7 11/08/2016: ALT 6 12/01/2016: BUN 29.1; Creatinine 1.2;  HGB 11.9; Platelets 347; Potassium 3.4; Sodium 146   Lipid Panel    Component Value Date/Time   CHOL 160 10/19/2016 1147   TRIG 109 10/19/2016 1147   HDL 57 10/19/2016 1147   CHOLHDL 2.8 10/19/2016 1147   VLDL 22 10/19/2016 1147   LDLCALC 81 10/19/2016 1147    Additional studies/ records that were reviewed today include:  Office notes, hospital records, lab work reviewed.  Echocardiogram 12/17/15: - Left ventricle: The cavity size was moderately dilated. Wall   thickness was normal. Systolic function was moderately reduced.   The estimated ejection fraction was in the range of 35% to 40%.   Diffuse hypokinesis. Doppler parameters are consistent with both   elevated ventricular end-diastolic filling pressure and elevated   left atrial filling pressure. - Aortic valve: There was trivial regurgitation. - Mitral valve: There was mild regurgitation. - Atrial septum: No defect or patent foramen ovale was identified. - Pulmonary arteries: PA peak pressure: 37 mm Hg (S). - Pericardium, extracardiac: Small posterior pericardial effusion  Cardiac catheterization 12/19/15: 1. Ost LM to LM lesion, 40% stenosed. 2. Ost RCA lesion, 20% stenosed. 3. There is moderate left  ventricular systolic dysfunction.    Likely nonischemic cardiomyopathy. There is coronary calcification noted in both the ostial RCA and Left Main, but no significant stenosis noted.   ASSESSMENT:    1. Chronic systolic CHF (congestive heart failure) ([place])   2. NICM (nonischemic cardiomyopathy) ([name])   3. Elevated troponin   4. Hypertension associated with diabetes ([place])   5. Iron deficiency anemia due to chronic blood loss      PLAN:  In order of problems listed above:    Chronic systolic heart failure/nonischemic cardiomyopathy/Non-obstructive coronary artery disease/coronary artery calcification  - Increase carvedilol 12.5 mg twice a day  - Entresto - were going to look into the cost.  - Lasix-40 mg once a day. Stable. No JVD on exam. No orthopnea.  - Thankfully, nonobstructive coronary disease on catheterization. Nonischemic.  - Continue with statin therapy  Elevated troponin  - Was demand ischemia 0.12.  Hypertension associated with diabetes  - Increasing carvedilol  - Diabetic management per Dr. Jill Alexanders.  Iron def anemia  - HgbWas 7.7  - Now on by mouth iron  - Gastric AVM   Medication Adjustments/Labs and Tests Ordered: Current medicines are reviewed at length with the patient today.  Concerns regarding medicines are outlined above.  Medication changes, Labs and Tests ordered today are listed in the Patient Instructions below. Patient Instructions  Medication Instructions:  Please increase your Carvedilol to 12.5 mg twice a day. Continue all other medications as listed.  Follow-Up: Follow up in 6 months with Cecilie Kicks, NP.  You will receive a letter in the mail 2 months before you are due.  Please call us when you receive this letter to schedule your follow up appointment.  If you need a refill on your cardiac medications before your next appointment, please call your pharmacy.  Thank you for choosing [facility]!!         Signed, Candee Furbish, MD  12/01/2016 11:14 AM    [facility] [address], [place], [place]  [address] Phone: [phone]; Fax: [phone]

## 2016-12-01 NOTE — Telephone Encounter (Signed)
Appointments scheduled per 12/01/16 los. Patient was also mailed a copy of the appointment schedule and letter, per 12/01/16 los.

## 2016-12-01 NOTE — Telephone Encounter (Signed)
Per MD, pt contacted to review ferritin results, currently 51.  Pt needs to be set up for IV injectafer x 2 doses.  Pt instructed to reduce oral iron to once daily after received infusion.  Pt verbalized understanding.  Message sent to schedulers.

## 2016-12-02 DIAGNOSIS — H353212 Exudative age-related macular degeneration, right eye, with inactive choroidal neovascularization: Secondary | ICD-10-CM | POA: Diagnosis not present

## 2016-12-03 ENCOUNTER — Ambulatory Visit (HOSPITAL_BASED_OUTPATIENT_CLINIC_OR_DEPARTMENT_OTHER): Payer: Medicare HMO

## 2016-12-03 VITALS — BP 158/49 | HR 59 | Temp 97.8°F | Resp 18

## 2016-12-03 DIAGNOSIS — D500 Iron deficiency anemia secondary to blood loss (chronic): Secondary | ICD-10-CM

## 2016-12-03 DIAGNOSIS — D509 Iron deficiency anemia, unspecified: Secondary | ICD-10-CM | POA: Diagnosis not present

## 2016-12-03 MED ORDER — ACETAMINOPHEN 325 MG PO TABS
650.0000 mg | ORAL_TABLET | Freq: Once | ORAL | Status: AC
  Administered 2016-12-03: 650 mg via ORAL

## 2016-12-03 MED ORDER — FUROSEMIDE 10 MG/ML IJ SOLN
INTRAMUSCULAR | Status: AC
  Filled 2016-12-03: qty 2

## 2016-12-03 MED ORDER — FUROSEMIDE 10 MG/ML IJ SOLN
20.0000 mg | Freq: Once | INTRAMUSCULAR | Status: AC
  Administered 2016-12-03: 20 mg via INTRAVENOUS

## 2016-12-03 MED ORDER — SODIUM CHLORIDE 0.9 % IV SOLN
750.0000 mg | Freq: Once | INTRAVENOUS | Status: AC
  Administered 2016-12-03: 750 mg via INTRAVENOUS
  Filled 2016-12-03: qty 15

## 2016-12-03 MED ORDER — ACETAMINOPHEN 325 MG PO TABS
ORAL_TABLET | ORAL | Status: AC
  Filled 2016-12-03: qty 2

## 2016-12-03 NOTE — Progress Notes (Signed)
flpaw

## 2016-12-03 NOTE — Patient Instructions (Signed)
Ferric carboxymaltose injection  What is this medicine?  FERRIC CARBOXYMALTOSE (ferr-ik car-box-ee-mol-toes) is an iron complex. Iron is used to make healthy red blood cells, which carry oxygen and nutrients throughout the body. This medicine is used to treat anemia in people with chronic kidney disease or people who cannot take iron by mouth.  This medicine may be used for other purposes; ask your health care provider or pharmacist if you have questions.  What should I tell my health care provider before I take this medicine?  They need to know if you have any of these conditions:  -anemia not caused by low iron levels  -high levels of iron in the blood  -liver disease  -an unusual or allergic reaction to iron, other medicines, foods, dyes, or preservatives  -pregnant or trying to get pregnant  -breast-feeding  How should I use this medicine?  This medicine is for infusion into a vein. It is given by a health care professional in a hospital or clinic setting.  Talk to your pediatrician regarding the use of this medicine in children. Special care may be needed.  Overdosage: If you think you have taken too much of this medicine contact a poison control center or emergency room at once.  NOTE: This medicine is only for you. Do not share this medicine with others.  What if I miss a dose?  It is important not to miss your dose. Call your doctor or health care professional if you are unable to keep an appointment.  What may interact with this medicine?  Do not take this medicine with any of the following medications:  -deferoxamine  -dimercaprol  -other iron products  This medicine may also interact with the following medications:  -chloramphenicol  -deferasirox  This list may not describe all possible interactions. Give your health care provider a list of all the medicines, herbs, non-prescription drugs, or dietary supplements you use. Also tell them if you smoke, drink alcohol, or use illegal drugs. Some items may  interact with your medicine.  What should I watch for while using this medicine?  Visit your doctor or health care professional regularly. Tell your doctor if your symptoms do not start to get better or if they get worse. You may need blood work done while you are taking this medicine.  You may need to follow a special diet. Talk to your doctor. Foods that contain iron include: whole grains/cereals, dried fruits, beans, or peas, leafy green vegetables, and organ meats (liver, kidney).  What side effects may I notice from receiving this medicine?  Side effects that you should report to your doctor or health care professional as soon as possible:  -allergic reactions like skin rash, itching or hives, swelling of the face, lips, or tongue -breathing problems  -changes in blood pressure  -feeling faint or lightheaded, falls  -flushing, sweating, or hot feelings  Side effects that usually do not require medical attention (Report these to your doctor or health care professional if they continue or are bothersome.):  -changes in taste  -constipation  -dizziness  -headache  -nausea  -pain, redness, or irritation at site where injected  -vomiting  This list may not describe all possible side effects. Call your doctor for medical advice about side effects. You may report side effects to FDA at 1-800-FDA-1088.  Where should I keep my medicine?  This drug is given in a hospital or clinic and will not be stored at home.  NOTE: This sheet is a   summary. It may not cover all possible information. If you have questions about this medicine, talk to your doctor, pharmacist, or health care provider.      2016, Elsevier/Gold Standard. (2012-04-21 15:36:34)

## 2016-12-03 NOTE — Progress Notes (Signed)
   12/03/16 1204  Vitals  BP (!) 158/49  BP Location Right Arm  Patient Position Sitting  Pulse Rate (!) 59  Resp 18  Temp 97.8 F (36.6 C)  Temp Source Oral  Pulse Oximetry  SpO2 100 %  Patient does not report any dizziness. She was accompanied to the restroom and did not have any difficulty. Discharged home with son.

## 2016-12-04 ENCOUNTER — Telehealth: Payer: Self-pay | Admitting: Hematology

## 2016-12-04 NOTE — Telephone Encounter (Signed)
Called patient re scheduling second iron infusion per 3/15 schedule message. Patient has questions as to why she needs to have another infusion. Infusion not scheduled due to patient's questions. Reply sent back to desk nurse via schedule message request asking that patient be contacted to clear up her questions re infusion and to let me know if/when ok to proceed with scheduling.

## 2016-12-06 ENCOUNTER — Other Ambulatory Visit: Payer: Medicare HMO

## 2016-12-06 ENCOUNTER — Telehealth: Payer: Self-pay | Admitting: Hematology

## 2016-12-06 NOTE — Telephone Encounter (Signed)
sw pt re iv iron appts per LOS. Pt requested 3/26 at 11 am  Due to transportation

## 2016-12-07 LAB — HM DIABETES EYE EXAM

## 2016-12-10 ENCOUNTER — Telehealth: Payer: Self-pay

## 2016-12-10 NOTE — Telephone Encounter (Signed)
I received a message through covermymeds stating that the pt has been denied for a tier exception for Entresto.  No one answered the pts home phone and the VM on her cell phone is full.  Will continue to call.

## 2016-12-13 ENCOUNTER — Encounter: Payer: Self-pay | Admitting: Family Medicine

## 2016-12-13 ENCOUNTER — Ambulatory Visit (HOSPITAL_BASED_OUTPATIENT_CLINIC_OR_DEPARTMENT_OTHER): Payer: Medicare HMO

## 2016-12-13 VITALS — BP 119/41 | HR 55 | Temp 98.1°F | Resp 20

## 2016-12-13 DIAGNOSIS — D509 Iron deficiency anemia, unspecified: Secondary | ICD-10-CM

## 2016-12-13 DIAGNOSIS — K31811 Angiodysplasia of stomach and duodenum with bleeding: Secondary | ICD-10-CM | POA: Diagnosis not present

## 2016-12-13 DIAGNOSIS — D500 Iron deficiency anemia secondary to blood loss (chronic): Secondary | ICD-10-CM

## 2016-12-13 DIAGNOSIS — K922 Gastrointestinal hemorrhage, unspecified: Secondary | ICD-10-CM | POA: Diagnosis not present

## 2016-12-13 MED ORDER — FUROSEMIDE 10 MG/ML IJ SOLN
INTRAMUSCULAR | Status: AC
  Filled 2016-12-13: qty 2

## 2016-12-13 MED ORDER — SODIUM CHLORIDE 0.9 % IV SOLN
Freq: Once | INTRAVENOUS | Status: AC
  Administered 2016-12-13: 12:00:00 via INTRAVENOUS

## 2016-12-13 MED ORDER — ACETAMINOPHEN 325 MG PO TABS
ORAL_TABLET | ORAL | Status: AC
  Filled 2016-12-13: qty 2

## 2016-12-13 MED ORDER — SODIUM CHLORIDE 0.9 % IV SOLN
750.0000 mg | Freq: Once | INTRAVENOUS | Status: AC
  Administered 2016-12-13: 750 mg via INTRAVENOUS
  Filled 2016-12-13: qty 15

## 2016-12-13 MED ORDER — ACETAMINOPHEN 325 MG PO TABS
650.0000 mg | ORAL_TABLET | Freq: Once | ORAL | Status: AC
  Administered 2016-12-13: 650 mg via ORAL

## 2016-12-13 MED ORDER — FUROSEMIDE 10 MG/ML IJ SOLN
20.0000 mg | Freq: Once | INTRAMUSCULAR | Status: AC
  Administered 2016-12-13: 20 mg via INTRAVENOUS

## 2016-12-13 NOTE — Patient Instructions (Signed)
Ferric carboxymaltose injection What is this medicine? FERRIC CARBOXYMALTOSE (ferr-ik car-box-ee-mol-toes) is an iron complex. Iron is used to make healthy red blood cells, which carry oxygen and nutrients throughout the body. This medicine is used to treat anemia in people with chronic kidney disease or people who cannot take iron by mouth. This medicine may be used for other purposes; ask your health care provider or pharmacist if you have questions. COMMON BRAND NAME(S): Injectafer What should I tell my health care provider before I take this medicine? They need to know if you have any of these conditions: -anemia not caused by low iron levels -high levels of iron in the blood -liver disease -an unusual or allergic reaction to iron, other medicines, foods, dyes, or preservatives -pregnant or trying to get pregnant -breast-feeding How should I use this medicine? This medicine is for infusion into a vein. It is given by a health care professional in a hospital or clinic setting. Talk to your pediatrician regarding the use of this medicine in children. Special care may be needed. Overdosage: If you think you have taken too much of this medicine contact a poison control center or emergency room at once. NOTE: This medicine is only for you. Do not share this medicine with others. What if I miss a dose? It is important not to miss your dose. Call your doctor or health care professional if you are unable to keep an appointment. What may interact with this medicine? Do not take this medicine with any of the following medications: -deferoxamine -dimercaprol -other iron products This medicine may also interact with the following medications: -chloramphenicol -deferasirox This list may not describe all possible interactions. Give your health care provider a list of all the medicines, herbs, non-prescription drugs, or dietary supplements you use. Also tell them if you smoke, drink alcohol, or use  illegal drugs. Some items may interact with your medicine. What should I watch for while using this medicine? Visit your doctor or health care professional regularly. Tell your doctor if your symptoms do not start to get better or if they get worse. You may need blood work done while you are taking this medicine. You may need to follow a special diet. Talk to your doctor. Foods that contain iron include: whole grains/cereals, dried fruits, beans, or peas, leafy green vegetables, and organ meats (liver, kidney). What side effects may I notice from receiving this medicine? Side effects that you should report to your doctor or health care professional as soon as possible: -allergic reactions like skin rash, itching or hives, swelling of the face, lips, or tongue -breathing problems -changes in blood pressure -feeling faint or lightheaded, falls -flushing, sweating, or hot feelings Side effects that usually do not require medical attention (report to your doctor or health care professional if they continue or are bothersome): -changes in taste -constipation -dizziness -headache -nausea -pain, redness, or irritation at site where injected -vomiting This list may not describe all possible side effects. Call your doctor for medical advice about side effects. You may report side effects to FDA at 1-800-FDA-1088. Where should I keep my medicine? This drug is given in a hospital or clinic and will not be stored at home. NOTE: This sheet is a summary. It may not cover all possible information. If you have questions about this medicine, talk to your doctor, pharmacist, or health care provider.  2018 Elsevier/Gold Standard (2015-10-09 11:20:47)  

## 2016-12-14 ENCOUNTER — Other Ambulatory Visit: Payer: Self-pay | Admitting: *Deleted

## 2016-12-14 MED ORDER — FERROUS SULFATE 325 (65 FE) MG PO TABS: 325.0000 mg | ORAL_TABLET | Freq: Three times a day (TID) | ORAL | 0 refills | Status: DC

## 2016-12-22 NOTE — Telephone Encounter (Signed)
I have attempted to reach the pt multipile times by phone without success.  The pts insurance has denied the pts tier exception for Entresto.  Will forward message to Dr Marlou Porch and his nurse as [name].

## 2016-12-29 NOTE — Telephone Encounter (Signed)
Received a PA form (with questions) from [facility]. All questions answered and form signed by Dr Marlou Porch. Form faxed to [facility]. Awaiting response.

## 2016-12-31 ENCOUNTER — Telehealth: Payer: Self-pay | Admitting: Cardiology

## 2016-12-31 NOTE — Telephone Encounter (Signed)
Follow up  [facility] called and stated it was approved but is still going to send over fax to double notify.

## 2016-12-31 NOTE — Telephone Encounter (Signed)
**Note De-Identified  Obfuscation** [employer] called the office to let us know that they have approved Entresto.  See phone note from 12/31/16.

## 2016-12-31 NOTE — Telephone Encounter (Addendum)
**Note De-Identified  Obfuscation** I will notify the pts pharmacy that Connie [name] has been approved.. Message forwarded to Connie [name] and his nurse as [name].

## 2017-01-03 NOTE — Telephone Encounter (Signed)
Received fax from [facility] confirming PA approval on [place]. Approval good from 09/20/16 until 09/19/17.

## 2017-01-13 DIAGNOSIS — H353212 Exudative age-related macular degeneration, right eye, with inactive choroidal neovascularization: Secondary | ICD-10-CM | POA: Diagnosis not present

## 2017-01-14 ENCOUNTER — Telehealth: Payer: Self-pay | Admitting: Family Medicine

## 2017-01-14 NOTE — Telephone Encounter (Signed)
Pt called requesting a refill on her protonix pt uses [facility], [place]

## 2017-01-15 ENCOUNTER — Other Ambulatory Visit: Payer: Self-pay | Admitting: Hematology

## 2017-01-17 ENCOUNTER — Other Ambulatory Visit: Payer: Self-pay

## 2017-01-17 ENCOUNTER — Encounter: Payer: Self-pay | Admitting: Physician Assistant

## 2017-01-17 MED ORDER — PANTOPRAZOLE SODIUM 20 MG PO TBEC: 20.0000 mg | DELAYED_RELEASE_TABLET | Freq: Every day | ORAL | 3 refills | Status: DC

## 2017-01-17 NOTE — Telephone Encounter (Signed)
Sent in protonix

## 2017-01-18 LAB — HM DIABETES EYE EXAM

## 2017-01-31 ENCOUNTER — Telehealth (HOSPITAL_COMMUNITY): Payer: Self-pay | Admitting: Physician Assistant

## 2017-01-31 NOTE — Telephone Encounter (Signed)
12/17/2016 pt asked we call back in a month.lm 12/02/16  NO ANSWER/NO SHOW ON 11/24/16  01/17/17  LETTER MAILED/D.MILLER  Pt will be removed from workqueue.

## 2017-02-01 ENCOUNTER — Encounter: Payer: Self-pay | Admitting: Family Medicine

## 2017-02-02 ENCOUNTER — Telehealth: Payer: Self-pay | Admitting: Family Medicine

## 2017-02-02 DIAGNOSIS — D500 Iron deficiency anemia secondary to blood loss (chronic): Secondary | ICD-10-CM | POA: Diagnosis not present

## 2017-02-02 NOTE — Telephone Encounter (Signed)
Pt called to let Dr Redmond School know that she will be seeing Dr Benson Norway today and having lab work done in their office. Pt wanted Dr Redmond School to be on the look out for the records

## 2017-02-04 ENCOUNTER — Encounter: Payer: Self-pay | Admitting: Family Medicine

## 2017-02-16 ENCOUNTER — Ambulatory Visit (INDEPENDENT_AMBULATORY_CARE_PROVIDER_SITE_OTHER): Payer: Medicare HMO | Admitting: Family Medicine

## 2017-02-16 ENCOUNTER — Encounter: Payer: Self-pay | Admitting: Family Medicine

## 2017-02-16 VITALS — BP 120/70 | HR 63 | Ht <= 58 in | Wt 124.0 lb

## 2017-02-16 DIAGNOSIS — E1169 Type 2 diabetes mellitus with other specified complication: Secondary | ICD-10-CM | POA: Diagnosis not present

## 2017-02-16 DIAGNOSIS — J3489 Other specified disorders of nose and nasal sinuses: Secondary | ICD-10-CM

## 2017-02-16 DIAGNOSIS — E1136 Type 2 diabetes mellitus with diabetic cataract: Secondary | ICD-10-CM | POA: Diagnosis not present

## 2017-02-16 DIAGNOSIS — E039 Hypothyroidism, unspecified: Secondary | ICD-10-CM | POA: Diagnosis not present

## 2017-02-16 DIAGNOSIS — I5022 Chronic systolic (congestive) heart failure: Secondary | ICD-10-CM

## 2017-02-16 DIAGNOSIS — E1121 Type 2 diabetes mellitus with diabetic nephropathy: Secondary | ICD-10-CM | POA: Diagnosis not present

## 2017-02-16 DIAGNOSIS — I10 Essential (primary) hypertension: Secondary | ICD-10-CM | POA: Diagnosis not present

## 2017-02-16 DIAGNOSIS — E1159 Type 2 diabetes mellitus with other circulatory complications: Secondary | ICD-10-CM

## 2017-02-16 DIAGNOSIS — E118 Type 2 diabetes mellitus with unspecified complications: Secondary | ICD-10-CM | POA: Diagnosis not present

## 2017-02-16 DIAGNOSIS — E785 Hyperlipidemia, unspecified: Secondary | ICD-10-CM | POA: Diagnosis not present

## 2017-02-16 DIAGNOSIS — D500 Iron deficiency anemia secondary to blood loss (chronic): Secondary | ICD-10-CM | POA: Diagnosis not present

## 2017-02-16 DIAGNOSIS — K31811 Angiodysplasia of stomach and duodenum with bleeding: Secondary | ICD-10-CM

## 2017-02-16 LAB — POCT GLYCOSYLATED HEMOGLOBIN (HGB A1C): HEMOGLOBIN A1C: 6.7

## 2017-02-16 NOTE — Addendum Note (Signed)
Addended by: Randel Books on: 02/16/2017 12:50 PM   Modules accepted: Orders

## 2017-02-16 NOTE — Progress Notes (Signed)
Subjective:   HPI  Connie [name] is a 83 y.o. female who presents for Chief Complaint  Patient presents with  . Medicare Wellness    med check plus    Medical care team includes: [name], MD here for primary care  Dr.Hung Dr Marlou Porch Dr [name]   Preventative care:  Last ophthalmology visit: 01/18/17 Last dental visit: 02/2017 Last colonoscopy: 02/21/15 Last mammogram: Last pap: N/A Last TMH:9//622 Last labs: 02/02/17  Prior vaccinations:  TD or Tdap:08/03/06 Influenza:06/10/16 Pneumococcal: 23: 08/03/06 13: 07/25/14 Shingles/Zostavax:11/11/06   Advanced directive: no Information given. Concerns: She is followed by ophthalmology in [place]. She has had cataract surgery and is getting injections into her her right eye by the ophthalmologist. The exact cause is unclear. She continues on her thyroid medication and is having no trouble with that. continues on her amlodipine, furosemide, valsartan without difficulty. She does follow with cardiology regularly. She is also seen by oncology for her chronic anemia for IV iron. She recently saw Dr. Benson Norway and apparently her hemoglobin was 12.4. She continues on pioglitazone.She does have a history of diabetic retinopathy and nephropathy. She apparently was on metformin in the past but is not on it at the present time. She continues on atorvastatin and is having no aches or pains with that.  Reviewed their medical, surgical, family, social, medication, and allergy history and updated chart as appropriate.  Past Medical History:  Diagnosis Date  . Allergy    RHINITIS  . Angiodysplasia of duodenum    hx/notes 11/21/2015  . Angiodysplasia of stomach    hx/notes 11/21/2015  . Arthritis    "joints ache"  . Bleeding gastric ulcer    hx/notes 11/21/2015  . Chronic blood loss anemia    secondary to angiodysplasia of the stomach and duodenum as well as history of bleeding gastric ulcer hx/notes 11/21/2015  . Chronic systolic CHF  (congestive heart failure) ([place]) 12/23/2015  . History of blood transfusion 01/2015; 06/2015; 11/21/2015  . Hypercholesteremia   . Hypertension   . Hypothyroidism   . Obesity   . Pneumonia "several times"  . Type II diabetes mellitus ([place])     Past Surgical History:  Procedure Laterality Date  . CARDIAC CATHETERIZATION N/A 12/19/2015   Procedure: Left Heart Cath and Coronary Angiography;  Surgeon: Leonie Man, MD;  Location: [facility];  Service: Cardiovascular;  Laterality: N/A;  . COLONOSCOPY N/A 08/30/2014   Procedure: COLONOSCOPY;  Surgeon: Beryle Beams, MD;  Location: [facility] ENDOSCOPY;  Service: Endoscopy;  Laterality: N/A;  . COLONOSCOPY N/A 02/21/2015   Procedure: COLONOSCOPY;  Surgeon: Carol Ada, MD;  Location: [facility] ENDOSCOPY;  Service: Endoscopy;  Laterality: N/A;  . ENTEROSCOPY N/A 06/06/2015   Procedure: ENTEROSCOPY;  Surgeon: Carol Ada, MD;  Location: [facility]COPY;  Service: Endoscopy;  Laterality: N/A;  . FRACTURE SURGERY    . GIVENS CAPSULE STUDY N/A 05/15/2015   Procedure: GIVENS CAPSULE STUDY;  Surgeon: Carol Ada, MD;  Location: [facility] ENDOSCOPY;  Service: Endoscopy;  Laterality: N/A;  . HOT HEMOSTASIS N/A 08/30/2014   Procedure: HOT HEMOSTASIS (ARGON PLASMA COAGULATION/BICAP);  Surgeon: Beryle Beams, MD;  Location: [facility]OS[facility];  Service: Endoscopy;  Laterality: N/A;  . HOT HEMOSTASIS N/A 06/06/2015   Procedure: HOT HEMOSTASIS (ARGON PLASMA COAGULATION/BICAP);  Surgeon: Carol Ada, MD;  Location: [facility]OSCOPY;  Service: Endoscopy;  Laterality: N/A;  . JOINT REPLACEMENT    . REVISION TOTAL KNEE ARTHROPLASTY Bilateral 2004-2015  . SHOULDER OPEN ROTATOR CUFF REPAIR Right 12/2004  open subacromial decompression, distal clavicle  resection, rotator cuff repair/notes 02/02/2011  . TONSILLECTOMY  1940s  . TOTAL KNEE ARTHROPLASTY Bilateral ~ 2002  . TOTAL THYROIDECTOMY  ~ 1966    Social History   Social History  . Marital status: Married    Spouse name: N/A  .  Number of children: N/A  . Years of education: N/A   Occupational History  . Not on file.   Social History Main Topics  . Smoking status: Never Smoker  . Smokeless tobacco: Never Used  . Alcohol use No  . Drug use: No  . Sexual activity: Not Currently   Other Topics Concern  . Not on file   Social History Narrative  . No narrative on file    Family History  Problem Relation Age of Onset  . Asthma Mother   . Ulcers Father   . Anemia Sister      Current Outpatient Prescriptions:  .  amLODipine (NORVASC) 10 MG tablet, Take 1 tablet (10 mg total) by mouth daily., Disp: 90 tablet, Rfl: 1 .  atorvastatin (LIPITOR) 40 MG tablet, TAKE ONE TABLET BY MOUTH ONCE DAILY, Disp: 90 tablet, Rfl: 3 .  carvedilol (COREG) 12.5 MG tablet, Take 1 tablet (12.5 mg total) by mouth 2 (two) times daily with a meal., Disp: 180 tablet, Rfl: 3 .  ferrous sulfate 325 (65 FE) MG tablet, Take 1 tablet (325 mg total) by mouth 3 (three) times daily with meals., Disp: 90 tablet, Rfl: 0 .  furosemide (LASIX) 40 MG tablet, Take 1 tablet (40 mg total) by mouth daily., Disp: 90 tablet, Rfl: 1 .  levothyroxine (SYNTHROID, LEVOTHROID) 125 MCG tablet, Take 125 mcg by mouth daily before breakfast., Disp: , Rfl:  .  Multiple Vitamins-Minerals (OCUVITE PRESERVISION PO), Take 1 capsule by mouth 2 (two) times daily., Disp: , Rfl:  .  pantoprazole (PROTONIX) 20 MG tablet, Take 1 tablet (20 mg total) by mouth daily., Disp: 90 tablet, Rfl: 3 .  potassium chloride (K-DUR) 10 MEQ tablet, Take 1 tablet (10 mEq total) by mouth as directed. Take 10 meq twice a day every Mon, Wed and Fri's, Disp: , Rfl:  .  sacubitril-valsartan (ENTRESTO) 49-51 MG, Take 1 tablet by mouth 2 (two) times daily., Disp: 180 tablet, Rfl: 3 .  Multiple Vitamins-Minerals (WOMENS MULTIVITAMIN PLUS PO), Take 1 tablet by mouth daily., Disp: , Rfl:  .  pioglitazone (ACTOS) 30 MG tablet, Take 1 tablet (30 mg total) by mouth daily. (Patient not taking: Reported  on 02/16/2017), Disp: 90 tablet, Rfl: 1  No Known Allergies     Review of Systems Them except as above Objective:  There were no vitals filed for this visit.  General appearance: alert, no distress, WD/WN, Caucasian female Skin: Lesion noted to the left side of the tip of the nose that is erythematous and slightly vascular. HEENT: normocephalic, conjunctiva/corneas normal, sclerae anicteric, PERRLA, EOMi, nares patent, no discharge or erythema, pharynx normal Oral cavity: MMM, tongue normal, teeth normal Neck: supple, no lymphadenopathy, no thyromegaly, no masses, normal ROM, no bruits Chest: non tender, normal shape and expansion Heart: RRR, normal S1, S2, no murmurs Lungs: CTA bilaterally, no wheezes, rhonchi, or rales Abdomen: +bs, soft, non tender, non distended, no masses, no hepatomegaly, no splenomegaly, no bruits Back: non tender, normal ROM, no scoliosis Musculoskeletal: upper extremities non tender, no obvious deformity, normal ROM throughout, lower extremities non tender, no obvious deformity, normal ROM throughout Extremities: no edema, no cyanosis, no clubbing Pulses: 2+  symmetric, upper and lower extremities, normal cap refill Neurological: alert, oriented x 3, CN2-12 intact, strength normal upper extremities and lower extremities, sensation normal throughout, DTRs 2+ throughout, no cerebellar signs, gait normal Psychiatric: normal affect, behavior normal, pleasant  Previous blood work was reviewed.   Assessment and Plan :   Angiodysplasia of stomach and duodenum with hemorrhage  Chronic systolic CHF (congestive heart failure) ([name])  External nasal lesion  Diabetic nephropathy associated with type 2 diabetes mellitus ([name])  Hypothyroidism, unspecified type  Hypertension associated with diabetes ([place])  Hyperlipidemia associated with type 2 diabetes mellitus ([place])  Cataract associated with type 2 diabetes mellitus ([place])  Nasal lesion  Iron deficiency anemia  due to chronic blood loss  Type 2 diabetes mellitus with complication, without long-term current use of insulin ([name]) The GI bleeding and stomach issues seem to be status quo. She seems to be fairly stable with her underlying heart condition as well as hypertension and lipids. She will continue be followed by ophthalmology for her cataract and the other eye problem. Continue on her thyroid medication. I will refer to dermatology as this is probably a basal cell and will need Mohs surgery. Also encouraged her to get a Shingrix vaccine as well as TDaP at the drugstore. In spite of the above diagnoses she seems to be holding her own fairly well. Physical exam - discussed and counseled on healthy lifestyle, diet, exercise, preventative care, vaccinations, sick and well care, proper use of emergency dept and after hours care, and addressed their concerns.

## 2017-03-04 ENCOUNTER — Ambulatory Visit: Payer: Medicare HMO | Admitting: Hematology

## 2017-03-04 ENCOUNTER — Other Ambulatory Visit: Payer: Medicare HMO

## 2017-03-07 ENCOUNTER — Encounter: Payer: Self-pay | Admitting: Hematology

## 2017-03-07 ENCOUNTER — Ambulatory Visit (HOSPITAL_BASED_OUTPATIENT_CLINIC_OR_DEPARTMENT_OTHER): Payer: Medicare HMO | Admitting: Hematology

## 2017-03-07 ENCOUNTER — Other Ambulatory Visit (HOSPITAL_BASED_OUTPATIENT_CLINIC_OR_DEPARTMENT_OTHER): Payer: Medicare HMO

## 2017-03-07 ENCOUNTER — Telehealth: Payer: Self-pay | Admitting: Hematology

## 2017-03-07 VITALS — BP 146/49 | HR 57 | Temp 98.7°F | Resp 18 | Wt 124.1 lb

## 2017-03-07 DIAGNOSIS — K31811 Angiodysplasia of stomach and duodenum with bleeding: Secondary | ICD-10-CM | POA: Diagnosis not present

## 2017-03-07 DIAGNOSIS — D509 Iron deficiency anemia, unspecified: Secondary | ICD-10-CM

## 2017-03-07 DIAGNOSIS — D500 Iron deficiency anemia secondary to blood loss (chronic): Secondary | ICD-10-CM

## 2017-03-07 DIAGNOSIS — E039 Hypothyroidism, unspecified: Secondary | ICD-10-CM | POA: Diagnosis not present

## 2017-03-07 LAB — COMPREHENSIVE METABOLIC PANEL
ALBUMIN: 3.6 g/dL (ref 3.5–5.0)
ALK PHOS: 84 U/L (ref 40–150)
ALT: 7 U/L (ref 0–55)
AST: 15 U/L (ref 5–34)
Anion Gap: 9 mEq/L (ref 3–11)
BILIRUBIN TOTAL: 0.69 mg/dL (ref 0.20–1.20)
BUN: 28.2 mg/dL — AB (ref 7.0–26.0)
CALCIUM: 9.1 mg/dL (ref 8.4–10.4)
CO2: 24 mEq/L (ref 22–29)
Chloride: 109 mEq/L (ref 98–109)
Creatinine: 1.1 mg/dL (ref 0.6–1.1)
EGFR: 48 mL/min/{1.73_m2} — ABNORMAL LOW (ref >90)
Glucose: 139 mg/dl (ref 70–140)
POTASSIUM: 4.5 meq/L (ref 3.5–5.1)
Sodium: 142 mEq/L (ref 136–145)
TOTAL PROTEIN: 6.7 g/dL (ref 6.4–8.3)

## 2017-03-07 LAB — CBC & DIFF AND RETIC
BASO%: 0.3 % (ref 0.0–2.0)
BASOS ABS: 0 10*3/uL (ref 0.0–0.1)
EOS%: 2.3 % (ref 0.0–7.0)
Eosinophils Absolute: 0.2 10*3/uL (ref 0.0–0.5)
HEMATOCRIT: 37.3 % (ref 34.8–46.6)
HEMOGLOBIN: 12 g/dL (ref 11.6–15.9)
IMMATURE RETIC FRACT: 5 % (ref 1.60–10.00)
LYMPH%: 8.1 % — ABNORMAL LOW (ref 14.0–49.7)
MCH: 30 pg (ref 25.1–34.0)
MCHC: 32.2 g/dL (ref 31.5–36.0)
MCV: 93.3 fL (ref 79.5–101.0)
MONO#: 0.8 10*3/uL (ref 0.1–0.9)
MONO%: 10.9 % (ref 0.0–14.0)
NEUT#: 5.7 10*3/uL (ref 1.5–6.5)
NEUT%: 78.4 % — AB (ref 38.4–76.8)
Platelets: 305 10*3/uL (ref 145–400)
RBC: 4 10*6/uL (ref 3.70–5.45)
RDW: 14.2 % (ref 11.2–14.5)
Retic %: 1.11 % (ref 0.70–2.10)
Retic Ct Abs: 44.4 10*3/uL (ref 33.70–90.70)
WBC: 7.3 10*3/uL (ref 3.9–10.3)
lymph#: 0.6 10*3/uL — ABNORMAL LOW (ref 0.9–3.3)
nRBC: 0 % (ref 0–0)

## 2017-03-07 LAB — FERRITIN: FERRITIN: 430 ng/mL — AB (ref 9–269)

## 2017-03-07 LAB — IRON AND TIBC
%SAT: 37 % (ref 21–57)
Iron: 69 ug/dL (ref 41–142)
TIBC: 185 ug/dL — ABNORMAL LOW (ref 236–444)
UIBC: 117 ug/dL — AB (ref 120–384)

## 2017-03-07 NOTE — Progress Notes (Signed)
Connie [name]    HEMATOLOGY/ONCOLOGY CLINIC NOTE  Date of Service: .03/07/2017  Patient Care Team: [name], MD as PCP - General (Family Medicine)  CHIEF COMPLAINTS/PURPOSE OF CONSULTATION:  Iron deficiency anemia due to GI bleeding from multiple AVMs  DIAGNOSIS: Iron deficiency anemia likely related to ongoing chronic GI bleeding from multiple AVMs  TREATMENT -IV iron replacement with injectafer as needed (tolerated well). (Had some shortness of breath with IV Feraheme - which was likely fluid overload as opposed to a true allergic reaction but patient has been hesitant to take this)  HISTORY OF PRESENTING ILLNESS:  Please see previous note for details  INTERVAL HISTORY  Ms Connie [name] is here for followup for her IDA. She received IV Injectafer 750 mg every weekly 2 doses about 3 months ago and tolerated it well. Patient hemoglobin today is stable at 12. She notes no overt GI bleeding. Ferritin levels are still pending from today. Recently had follow-up with Dr. Benson Norway her gastroenterologist. Does not report any other planned interventions at this time.   MEDICAL HISTORY:  Past Medical History:  Diagnosis Date  . Allergy    RHINITIS  . Angiodysplasia of duodenum    hx/notes 11/21/2015  . Angiodysplasia of stomach    hx/notes 11/21/2015  . Arthritis    "joints ache"  . Bleeding gastric ulcer    hx/notes 11/21/2015  . Chronic blood loss anemia    secondary to angiodysplasia of the stomach and duodenum as well as history of bleeding gastric ulcer hx/notes 11/21/2015  . Chronic systolic CHF (congestive heart failure) ([place]) 12/23/2015  . History of blood transfusion 01/2015; 06/2015; 11/21/2015  . Hypercholesteremia   . Hypertension   . Hypothyroidism   . Obesity   . Pneumonia "several times"  . Type II diabetes mellitus ([place])     SURGICAL HISTORY: Past Surgical History:  Procedure Laterality Date  . CARDIAC CATHETERIZATION N/A 12/19/2015   Procedure: Left Heart Cath and Coronary  Angiography;  Surgeon: Leonie Man, MD;  Location: [facility];  Service: Cardiovascular;  Laterality: N/A;  . COLONOSCOPY N/A 08/30/2014   Procedure: COLONOSCOPY;  Surgeon: Beryle Beams, MD;  Location: [facility] ENDOSCOPY;  Service: Endoscopy;  Laterality: N/A;  . COLONOSCOPY N/A 02/21/2015   Procedure: COLONOSCOPY;  Surgeon: Carol Ada, MD;  Location: [facility] ENDOSCOPY;  Service: Endoscopy;  Laterality: N/A;  . ENTEROSCOPY N/A 06/06/2015   Procedure: ENTEROSCOPY;  Surgeon: Carol Ada, MD;  Location: [facility] ENDOSCOPY;  Service: Endoscopy;  Laterality: N/A;  . FRACTURE SURGERY    . GIVENS CAPSULE STUDY N/A 05/15/2015   Procedure: GIVENS CAPSULE STUDY;  Surgeon: Carol Ada, MD;  Location: [facility] ENDOSCOPY;  Service: Endoscopy;  Laterality: N/A;  . HOT HEMOSTASIS N/A 08/30/2014   Procedure: HOT HEMOSTASIS (ARGON PLASMA COAGULATION/BICAP);  Surgeon: Beryle Beams, MD;  Location: [facility]OSCOPY;  Service: Endoscopy;  Laterality: N/A;  . HOT HEMOSTASIS N/A 06/06/2015   Procedure: HOT HEMOSTASIS (ARGON PLASMA COAGULATION/BICAP);  Surgeon: Carol Ada, MD;  Location: [facility]OS[facility];  Service: Endoscopy;  Laterality: N/A;  . JOINT REPLACEMENT    . REVISION TOTAL KNEE ARTHROPLASTY Bilateral 2004-2015  . SHOULDER OPEN ROTATOR CUFF REPAIR Right 12/2004   open subacromial decompression, distal clavicle  resection, rotator cuff repair/notes 02/02/2011  . TONSILLECTOMY  1940s  . TOTAL KNEE ARTHROPLASTY Bilateral ~ 2002  . TOTAL THYROIDECTOMY  ~ 1966    SOCIAL HISTORY: Social History   Social History  . Marital status: Married    Spouse name: N/A  .  Number of children: N/A  . Years of education: N/A   Occupational History  . Not on file.   Social History Main Topics  . Smoking status: Never Smoker  . Smokeless tobacco: Never Used  . Alcohol use No  . Drug use: No  . Sexual activity: Not Currently   Other Topics Concern  . Not on file   Social History Narrative  . No narrative on file     FAMILY HISTORY: Family History  Problem Relation Age of Onset  . Asthma Mother   . Ulcers Father   . Anemia Sister     ALLERGIES:  has No Known Allergies.  MEDICATIONS:  Current Outpatient Prescriptions  Medication Sig Dispense Refill  . amLODipine (NORVASC) 10 MG tablet Take 1 tablet (10 mg total) by mouth daily. 90 tablet 1  . atorvastatin (LIPITOR) 40 MG tablet TAKE ONE TABLET BY MOUTH ONCE DAILY 90 tablet 3  . carvedilol (COREG) 12.5 MG tablet Take 1 tablet (12.5 mg total) by mouth 2 (two) times daily with a meal. 180 tablet 3  . ferrous sulfate 325 (65 FE) MG tablet Take 1 tablet (325 mg total) by mouth 3 (three) times daily with meals. 90 tablet 0  . furosemide (LASIX) 40 MG tablet Take 1 tablet (40 mg total) by mouth daily. 90 tablet 1  . levothyroxine (SYNTHROID, LEVOTHROID) 125 MCG tablet Take 125 mcg by mouth daily before breakfast.    . Multiple Vitamins-Minerals (WOMENS MULTIVITAMIN PLUS PO) Take 1 tablet by mouth daily.    . pantoprazole (PROTONIX) 20 MG tablet Take 1 tablet (20 mg total) by mouth daily. 90 tablet 3  . pioglitazone (ACTOS) 30 MG tablet Take 1 tablet (30 mg total) by mouth daily. (Patient not taking: Reported on 02/16/2017) 90 tablet 1  . potassium chloride (K-DUR) 10 MEQ tablet Take 1 tablet (10 mEq total) by mouth as directed. Take 10 meq twice a day every Mon, Wed and Fri's    . sacubitril-valsartan (ENTRESTO) 49-51 MG Take 1 tablet by mouth 2 (two) times daily. 180 tablet 3   No current facility-administered medications for this visit.     REVIEW OF SYSTEMS:    10 Point review of Systems was done is negative except as noted above.  PHYSICAL EXAMINATION: ECOG PERFORMANCE STATUS: 1 - Symptomatic but completely ambulatory  . Vitals:   03/07/17 1010  BP: (!) 146/49  Pulse: (!) 57  Resp: 18  Temp: 98.7 F (37.1 C)   Filed Weights   03/07/17 1010  Weight: 124 lb 1.6 oz (56.3 kg)   .Body mass index is 25.94 kg/m.  GENERAL:alert, in no  acute distress and comfortable SKIN: skin color, texture, turgor are normal, no rashes or significant lesions EYES: normal, conjunctiva are pink and non-injected, sclera clear OROPHARYNX:no exudate, no erythema and lips, buccal mucosa, and tongue normal  NECK: supple, no JVD, thyroid normal size, non-tender, without nodularity LYMPH:  no palpable lymphadenopathy in the cervical, axillary or inguinal LUNGS: clear to auscultation with normal respiratory effort HEART: regular rate & rhythm,  no murmurs and no lower extremity edema ABDOMEN: abdomen soft, non-tender, normoactive bowel sounds ,  no palpable hepatosplenomegaly Musculoskeletal: no cyanosis of digits and no clubbing  PSYCH: alert & oriented x 3 with fluent speech NEURO: no focal motor/sensory deficits  LABORATORY DATA:    CBC Latest Ref Rng & Units 03/07/2017 12/01/2016 11/08/2016  WBC 3.9 - 10.3 10e3/uL 7.3 7.7 6.1  Hemoglobin 11.6 - 15.9 g/dL 12.0 11.9 10.5(L)  Hematocrit 34.8 - 46.6 % 37.3 37.2 33.7(L)  Platelets 145 - 400 10e3/uL 305 347 389    . CBC    Component Value Date/Time   WBC 7.3 03/07/2017 0910   WBC 6.1 11/08/2016 1355   RBC 4.00 03/07/2017 0910   RBC 3.60 (L) 11/08/2016 1355   HGB 12.0 03/07/2017 0910   HCT 37.3 03/07/2017 0910   PLT 305 03/07/2017 0910   MCV 93.3 03/07/2017 0910   MCH 30.0 03/07/2017 0910   MCH 29.2 11/08/2016 1355   MCHC 32.2 03/07/2017 0910   MCHC 31.2 (L) 11/08/2016 1355   RDW 14.2 03/07/2017 0910   LYMPHSABS 0.6 (L) 03/07/2017 0910   MONOABS 0.8 03/07/2017 0910   EOSABS 0.2 03/07/2017 0910   BASOSABS 0.0 03/07/2017 0910    . CMP Latest Ref Rng & Units 03/07/2017 12/01/2016 11/08/2016  Glucose 70 - 140 mg/dl 139 173(H) 162(H)  BUN 7.0 - 26.0 mg/dL 28.2(H) 29.1(H) 35(H)  Creatinine 0.6 - 1.1 mg/dL 1.1 1.2(H) 1.16(H)  Sodium 136 - 145 mEq/L 142 146(H) 143  Potassium 3.5 - 5.1 mEq/L 4.5 3.4(L) 3.8  Chloride 98 - 110 mmol/L - - 108  CO2 22 - 29 mEq/L 24 27 26   Calcium 8.4 -  10.4 mg/dL 9.1 9.2 9.1  Total Protein 6.4 - 8.3 g/dL 6.7 7.0 6.6  Total Bilirubin 0.20 - 1.20 mg/dL 0.69 0.7 0.5  Alkaline Phos 40 - 150 U/L 84 65 58  AST 5 - 34 U/L 15 19 16   ALT 0 - 55 U/L 7 8(L) 6    Lab Results  Component Value Date   FERRITIN 51 12/01/2016     ASSESSMENT & PLAN:   83 year old Caucasian female with  #1 Microcytic anemia likely related to iron deficiency from chronic GI bleeding. No known disorder causing chronic inflammation at this time. Patient's hemoglobin is stable at 12. Ferritin level is pending from today. Patient received IV Injectafer weekly x 2 doses last about 3 months ago  #2 history of recurrent GI bleeding related to upper and lower GI tract AVMs and gastric erosions. Has had several admissions with requirement of multiple PRBC transfusions. Dropping ferritin suggests ongoing slow losses. Hemoglobin is relatively stable. No clinically overt GI bleeding noted.  #3 status post acute renal failure in January 2017 currently creatinine within normal limits-  #4 hypothyroidism on levothyroxine .  #5 ?? Reaction to [facility] - current hospital admission that shortness of breath appears to be more likely related to CHF decompensation than allergic reaction to Feraheme. However patient is very anxious and hesitant to get this preparation.  Plan  -patient is currently off ASA to try to reduce her risk of GI bleeding though she continues to remain at high-risk for GI bleeding given her extensive AVMs. -She reports no overt GI bleeding. -We shall follow up her pending ferritin levels from today and replace IV iron as needed to maintain ferritin levels of more than 100 -continue daily multivitamin orally. -may hold po iron -Continue close followup with primary care physician for management of other medical issues. (HTN control)  Return to care with Dr. [name] in 3 months with repeat CBC, CMP and ferritin to reassess status of anemia. We shall determine  need for additional IV iron prior to that based on ferritin levels.   All of the patients questions were answered with apparent satisfaction. The patient knows to call the clinic with any problems, questions or concerns.  I spent 20 minutes counseling the patient face  to face. The total time spent in the appointment was 25 minutes and more than 50% was on counseling and direct patient cares.    Sullivan Lone MD [place] AAHIVMS [facility] Hematology/Oncology Physician [facility]  (Office):       [phone] (Work cell):  [phone] (Fax):           [phone]

## 2017-03-07 NOTE — Telephone Encounter (Signed)
Appointments scheduled per 03/07/17 los. °Patient was given a copy of the AVS report and appointment schedule, per 03/07/17 los. °

## 2017-03-07 NOTE — Patient Instructions (Signed)
Thank you for choosing [facility] to provide your oncology and hematology care.  To afford each patient quality time with our providers, please arrive 30 minutes before your scheduled appointment time.  If you arrive late for your appointment, you may be asked to reschedule.  We strive to give you quality time with our providers, and arriving late affects you and other patients whose appointments are after yours.  If you are a no show for multiple scheduled visits, you may be dismissed from the clinic at the providers discretion.   Again, thank you for choosing [facility], our hope is that these requests will decrease the amount of time that you wait before being seen by our physicians.  ______________________________________________________________________ Should you have questions after your visit to the [facility], please contact our office at [phone] between the hours of 8:30 and 4:30 p.m.    Voicemails left after 4:30p.m will not be returned until the following business day.   For prescription refill requests, please have your pharmacy contact us directly.  Please also try to allow 48 hours for prescription requests.   Please contact the scheduling department for questions regarding scheduling.  For scheduling of procedures such as PET scans, CT scans, MRI, Ultrasound, etc please contact central scheduling at [phone].   Resources For Cancer Patients and Caregivers:  American Cancer Society:  800-227-2345  Can help patients locate various types of support and financial assistance Cancer Care: 1-800-813-HOPE (4673) Provides financial assistance, online support groups, medication/co-pay assistance.   [place] DSS:  336-641-3447 Where to apply for food stamps, Medicaid, and utility assistance Medicare Rights Center: 800-333-4114 Helps people with Medicare understand their rights and benefits, navigate the Medicare system, and secure the  quality healthcare they deserve SCAT: 336-333-6589 [employer]'s shared-ride transportation service for eligible riders who have a disability that prevents them from riding the fixed route bus.   For additional information on assistance programs please contact our social worker:   Grier Hock/Abigail Elmore:  [phone] 

## 2017-03-14 DIAGNOSIS — L570 Actinic keratosis: Secondary | ICD-10-CM | POA: Diagnosis not present

## 2017-03-17 DIAGNOSIS — H353212 Exudative age-related macular degeneration, right eye, with inactive choroidal neovascularization: Secondary | ICD-10-CM | POA: Diagnosis not present

## 2017-04-05 ENCOUNTER — Other Ambulatory Visit: Payer: Self-pay | Admitting: Family Medicine

## 2017-04-20 ENCOUNTER — Telehealth: Payer: Self-pay | Admitting: Family Medicine

## 2017-04-20 NOTE — Telephone Encounter (Signed)
Pt said she is having a lot of swelling in her legs recently. More in her left leg. Pt wants to know if she can get meds for this or if she needs to come in for an appointment first? Send meds to local pharmacy at [facility], [address] [place], [place]

## 2017-04-20 NOTE — Telephone Encounter (Signed)
She needs an appointment.

## 2017-04-20 NOTE — Telephone Encounter (Signed)
Pt scheduled for 04/20/2017. Connie [name]

## 2017-04-21 ENCOUNTER — Encounter: Payer: Self-pay | Admitting: Family Medicine

## 2017-04-21 ENCOUNTER — Ambulatory Visit (INDEPENDENT_AMBULATORY_CARE_PROVIDER_SITE_OTHER): Payer: Medicare HMO | Admitting: Family Medicine

## 2017-04-21 ENCOUNTER — Telehealth: Payer: Self-pay | Admitting: Cardiology

## 2017-04-21 VITALS — BP 120/50 | HR 70 | Wt 129.0 lb

## 2017-04-21 DIAGNOSIS — D500 Iron deficiency anemia secondary to blood loss (chronic): Secondary | ICD-10-CM | POA: Diagnosis not present

## 2017-04-21 DIAGNOSIS — I428 Other cardiomyopathies: Secondary | ICD-10-CM

## 2017-04-21 DIAGNOSIS — E1159 Type 2 diabetes mellitus with other circulatory complications: Secondary | ICD-10-CM

## 2017-04-21 DIAGNOSIS — I10 Essential (primary) hypertension: Secondary | ICD-10-CM | POA: Diagnosis not present

## 2017-04-21 DIAGNOSIS — I5022 Chronic systolic (congestive) heart failure: Secondary | ICD-10-CM | POA: Diagnosis not present

## 2017-04-21 LAB — CBC WITH DIFFERENTIAL/PLATELET
BASOS ABS: 0 {cells}/uL (ref 0–200)
BASOS PCT: 0 %
EOS ABS: 225 {cells}/uL (ref 15–500)
Eosinophils Relative: 3 %
HEMATOCRIT: 33.6 % — AB (ref 35.0–45.0)
Hemoglobin: 10.6 g/dL — ABNORMAL LOW (ref 11.7–15.5)
LYMPHS PCT: 7 %
Lymphs Abs: 525 cells/uL — ABNORMAL LOW (ref 850–3900)
MCH: 29.9 pg (ref 27.0–33.0)
MCHC: 31.5 g/dL — ABNORMAL LOW (ref 32.0–36.0)
MCV: 94.6 fL (ref 80.0–100.0)
MONO ABS: 900 {cells}/uL (ref 200–950)
MPV: 11.7 fL (ref 7.5–12.5)
Monocytes Relative: 12 %
NEUTROS ABS: 5850 {cells}/uL (ref 1500–7800)
Neutrophils Relative %: 78 %
PLATELETS: 284 10*3/uL (ref 140–400)
RBC: 3.55 MIL/uL — ABNORMAL LOW (ref 3.80–5.10)
RDW: 13.3 % (ref 11.0–15.0)
WBC: 7.5 10*3/uL (ref 4.0–10.5)

## 2017-04-21 LAB — COMPREHENSIVE METABOLIC PANEL
ALK PHOS: 69 U/L (ref 33–130)
ALT: 5 U/L — AB (ref 6–29)
AST: 15 U/L (ref 10–35)
Albumin: 3.8 g/dL (ref 3.6–5.1)
BILIRUBIN TOTAL: 0.7 mg/dL (ref 0.2–1.2)
BUN: 28 mg/dL — AB (ref 7–25)
CALCIUM: 8.6 mg/dL (ref 8.6–10.4)
CO2: 23 mmol/L (ref 20–31)
CREATININE: 1.05 mg/dL — AB (ref 0.60–0.88)
Chloride: 107 mmol/L (ref 98–110)
GLUCOSE: 146 mg/dL — AB (ref 65–99)
Potassium: 3.8 mmol/L (ref 3.5–5.3)
SODIUM: 143 mmol/L (ref 135–146)
Total Protein: 6.2 g/dL (ref 6.1–8.1)

## 2017-04-21 NOTE — Telephone Encounter (Signed)
Pt was seen by her PCP today who increased her Furosemide d/t edema and requested she f/u with cardiologist.  Scheduled pt for next available APP appt and placed her in the waitlist.  She will c/b if further concerns prior to her appt.

## 2017-04-21 NOTE — Patient Instructions (Signed)
Take 2 of the furosemide pills daily for the next couple of days and keep track of your weight. Get an appointment with her cardiologist

## 2017-04-21 NOTE — Telephone Encounter (Signed)
New message  Pt call requesting to speak with RN about getting a sooner f/u appt with only Dr. Marlou Porch. Please call back to discuss

## 2017-04-21 NOTE — Progress Notes (Signed)
   Subjective:    Patient ID: Connie [name], female    DOB: [birth year], 83 y.o.   MRN: [id number]  HPI She is here for evaluation of a three-day history of bilateral leg swelling, left greater than right. She does not complain of chest pain, shortness of breath, weakness or PND but does complain of some questionable dyspnea. She does have a previous history of CHF. She also would like to have her blood counts checked as she has had a previous history of anema.   Review of Systems     Objective:   Physical Exam Alert and in no distress. Cardiac exam does show a grade 2/6 systolic murmur. Lungs are clear to auscultation. Lower extremities do show 3+ edema.       Assessment & Plan:  Iron deficiency anemia due to chronic blood loss - Plan: CBC with Differential/Platelet  Hypertension associated with diabetes ([place]) - Plan: CBC with Differential/Platelet, Comprehensive metabolic panel  Chronic systolic CHF (congestive heart failure) ([place]) - Plan: CBC with Differential/Platelet, Comprehensive metabolic panel  NICM (nonischemic cardiomyopathy) ([place]) - Plan: CBC with Differential/Platelet, Comprehensive metabolic panel Take 2 of the furosemide pills daily for the next couple of days and keep track of your weight. Get an appointment with your cardiologist

## 2017-04-22 ENCOUNTER — Telehealth: Payer: Self-pay

## 2017-04-22 ENCOUNTER — Other Ambulatory Visit: Payer: Self-pay

## 2017-04-22 MED ORDER — FUROSEMIDE 40 MG PO TABS: 40.0000 mg | ORAL_TABLET | Freq: Every day | ORAL | 1 refills | Status: DC

## 2017-04-22 NOTE — Telephone Encounter (Signed)
That's okay but have her set up an appointment with me late next week

## 2017-04-22 NOTE — Telephone Encounter (Signed)
Mrs. Skalsky called and said she couldn't get appointment with Cardio. Until Nov. But she can get one with PA 05/13/17 she wanted to know is this okay please advise

## 2017-04-25 NOTE — Telephone Encounter (Signed)
Pt has appointment 

## 2017-04-26 ENCOUNTER — Other Ambulatory Visit: Payer: Self-pay

## 2017-04-26 ENCOUNTER — Telehealth: Payer: Self-pay

## 2017-04-26 MED ORDER — ACCU-CHEK GUIDE W/DEVICE KIT: 1.0000 | PACK | Freq: Two times a day (BID) | 0 refills | Status: DC

## 2017-04-26 MED ORDER — GLUCOSE BLOOD VI STRP: ORAL_STRIP | 3 refills | Status: DC

## 2017-04-26 MED ORDER — ACCU-CHEK FASTCLIX LANCET KIT: 1.0000 | PACK | Freq: Two times a day (BID) | 0 refills | Status: DC

## 2017-04-26 MED ORDER — ACCU-CHEK FASTCLIX LANCETS MISC: 1.0000 | Freq: Two times a day (BID) | 3 refills | Status: DC

## 2017-04-26 NOTE — Telephone Encounter (Signed)
Sent those in too

## 2017-04-26 NOTE — Telephone Encounter (Signed)
Pt needs refill of carvedilol, accu-check meter and test strips to [facility]. Victorino December

## 2017-04-26 NOTE — Telephone Encounter (Signed)
Additional rx request rcvd for accucheck lancets.

## 2017-04-26 NOTE — Telephone Encounter (Signed)
Sent in RX

## 2017-04-27 IMAGING — CT CT ABD-PELV W/ CM
1 of 3 series · 13 of 32 positions shown, 18 images · IV contrast (iopamidol)
Comparison: Renal ultrasound 02/20/2015.

CLINICAL DATA: Periumbilical pain with nausea for few months.
Weight loss anemia.

EXAM:
CT ABDOMEN AND PELVIS WITH CONTRAST
TECHNIQUE: Multidetector CT imaging of the abdomen and pelvis was performed
using the standard protocol following bolus administration of
intravenous contrast.
CONTRAST:  80mL H2UTPV-JYY IOPAMIDOL (H2UTPV-JYY) INJECTION 61%

[Series 2: abd/pelvis w/cm · axial · 0.60mm/px · z∈[+704,+1054]mm · 13 of 80 slices shown, 18 images]
[im 5/80  soft-tissue]
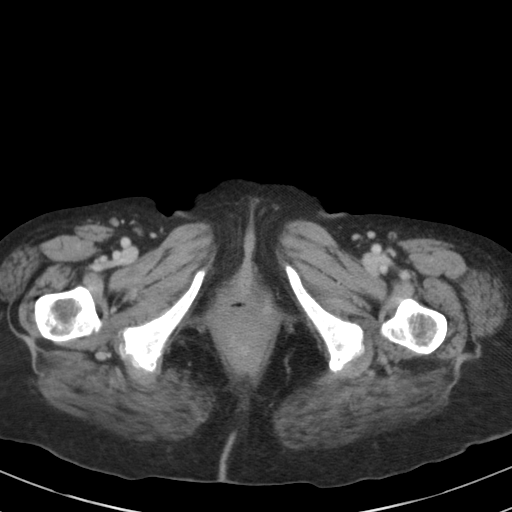
[im 5/80  bone]
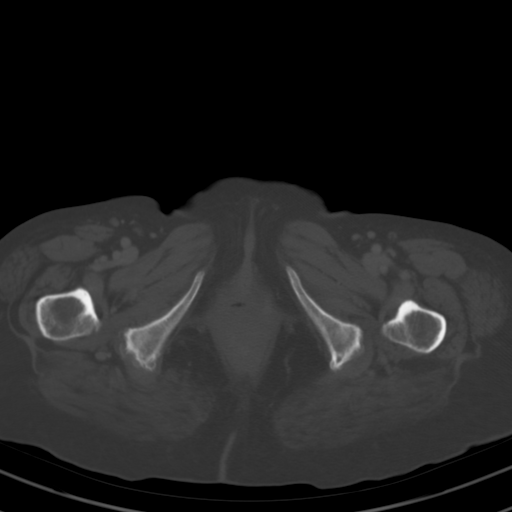
[im 14/80  soft-tissue]
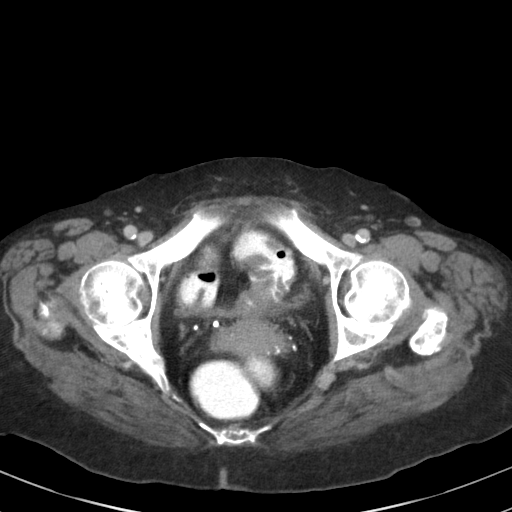
[im 18/80  soft-tissue]
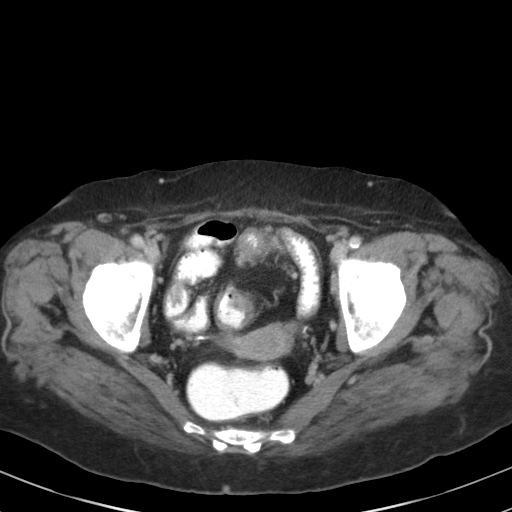
[im 22/80  soft-tissue]
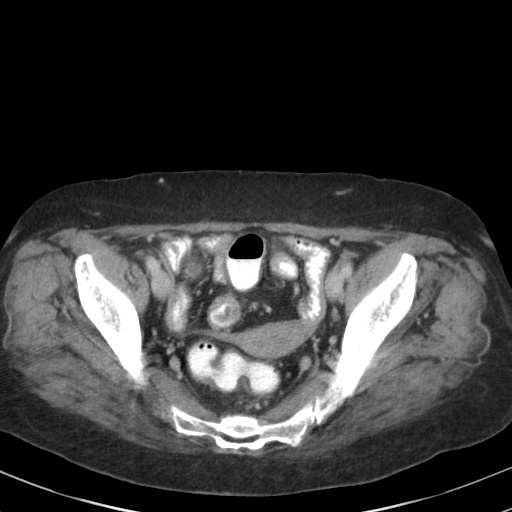
[im 31/80  soft-tissue]
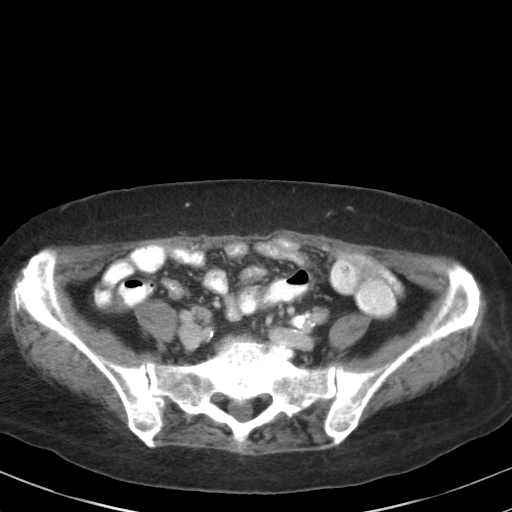
[im 36/80  soft-tissue]
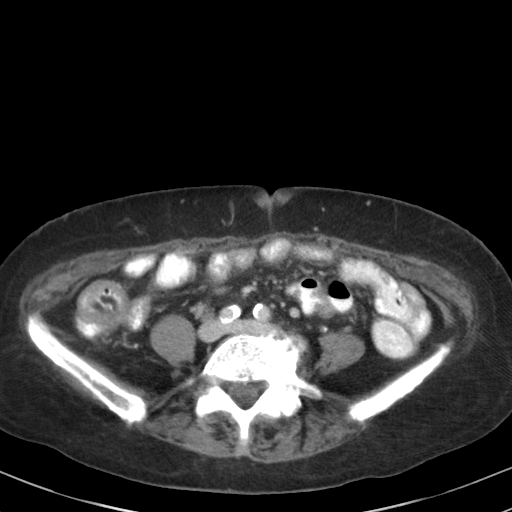
[im 44/80  soft-tissue]
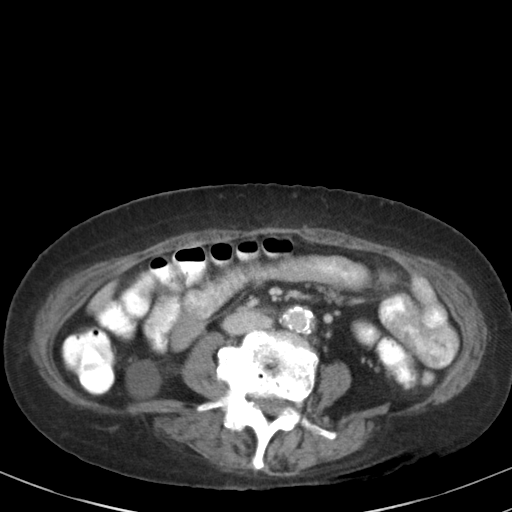
[im 49/80  soft-tissue]
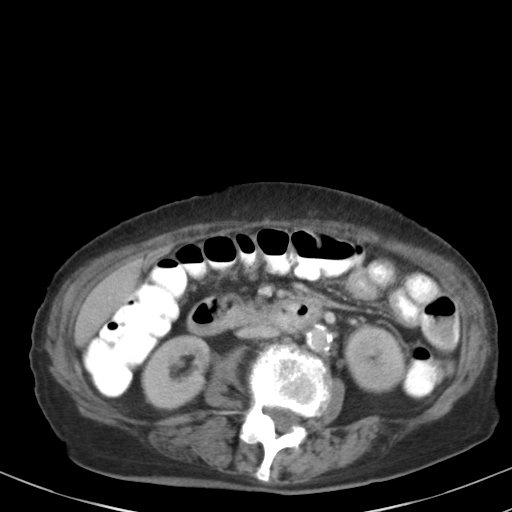
[im 58/80  soft-tissue]
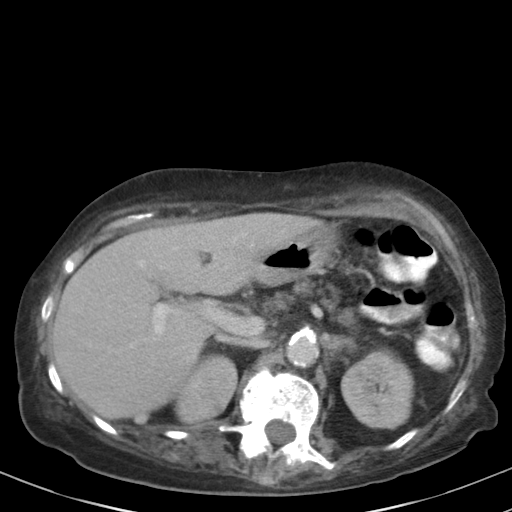
[im 58/80  bone]
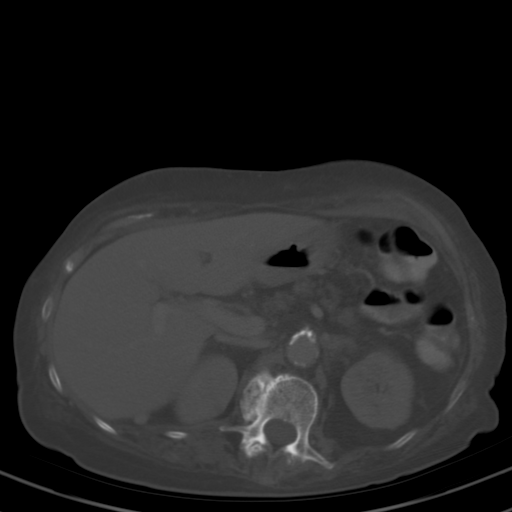
[im 62/80  soft-tissue]
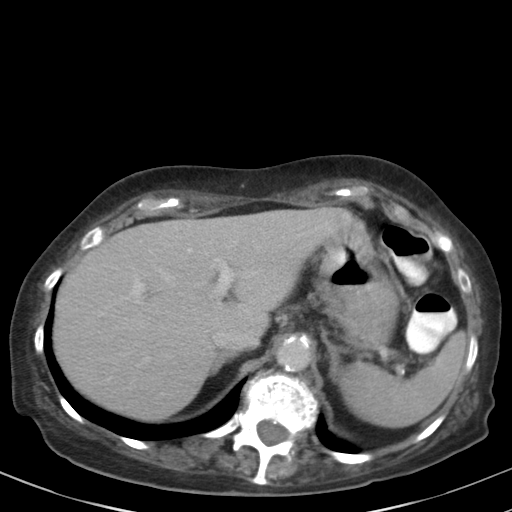
[im 62/80  lung]
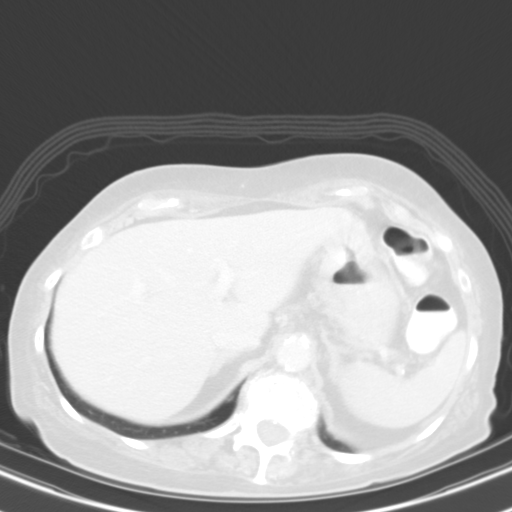
[im 66/80  soft-tissue]
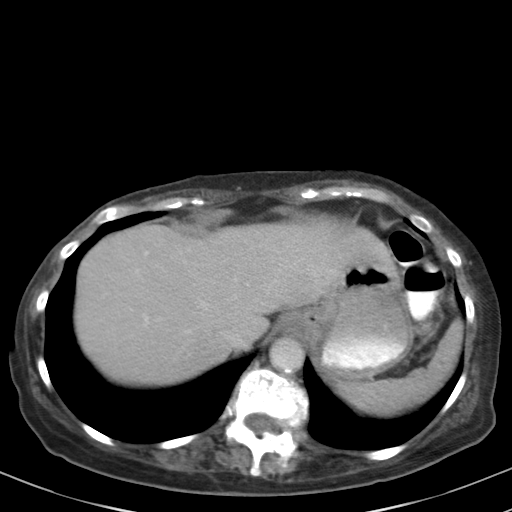
[im 66/80  lung]
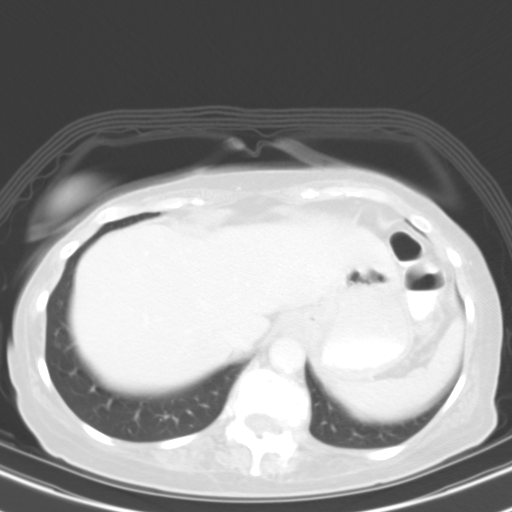
[im 71/80  lung]
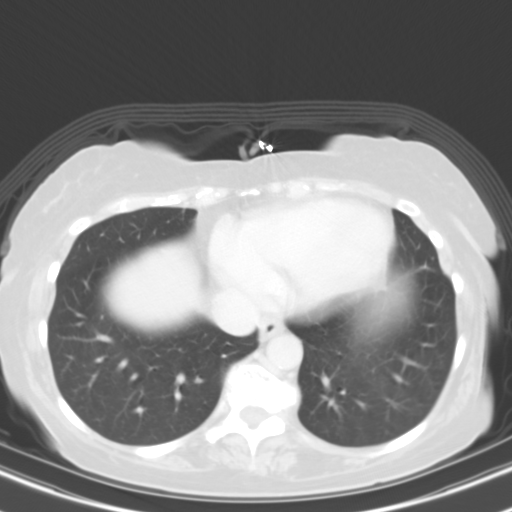
[im 75/80  soft-tissue]
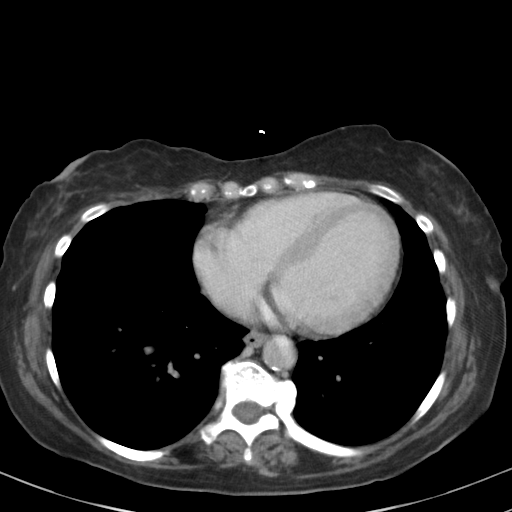
[im 75/80  lung]
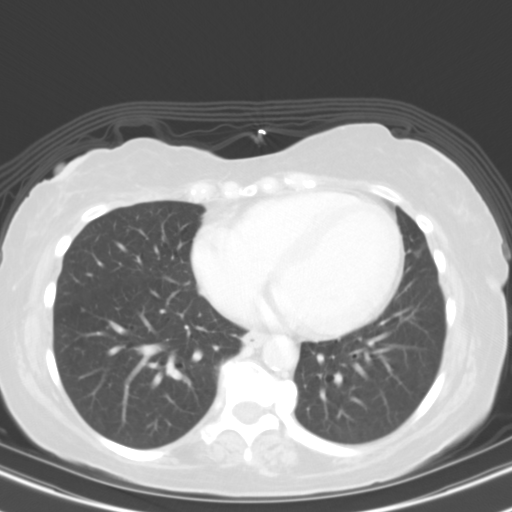

[13 of 32 positions shown; findings below may reference images not displayed]

FINDINGS: Lower chest: 3 mm right lower lobe pulmonary nodule on image
8/series 5. A lingular nodule measures 5 mm on image 7/series 5.

Mild cardiomegaly, without pericardial or pleural effusion.

Hepatobiliary: Minimal motion degradation within the abdomen. Normal
liver. Cholelithiasis, 9 mm stone. No surrounding inflammation or
biliary duct dilatation.

Pancreas: Pancreatic atrophy. Suspicion of areas of main and side
branch duct ectasia, including within the body on image 22/series 2
and the head on image [DATE]. No surrounding inflammation.

Spleen: Normal in size, without focal abnormality.

Adrenals/Urinary Tract: Mild bilateral adrenal thickening. Lower
pole right renal cyst of 2.6 cm. Smaller left renal lesions are also
likely cysts. Bilateral too small to characterize renal lesions. No
hydronephrosis. Decompressed bladder.

Stomach/Bowel: Underdistention of the gastric body. Colonic
diverticulosis with probable muscular hypertrophy in sigmoid. Normal
terminal ileum and appendix. Normal small bowel.

Vascular/Lymphatic: Advanced aortic and branch vessel
atherosclerosis. No abdominopelvic adenopathy.

Reproductive: Normal uterus and adnexa.

Other: No significant free fluid.

Musculoskeletal: Remote trauma to the right ischium. Convex left
lumbar spine curvature. Advanced lumbosacral spondylosis.
IMPRESSION: 1.  No acute process in the abdomen or pelvis.
2. Minimal motion degradation.
3. Suspicion of main and/or side branch duct ectasia within the
pancreas. This could represent the sequelae of pancreatitis. One or
more cystic pancreatic neoplasms could look similar. Suboptimally
evaluated secondary to nondedicated CT technique and minimal motion.
If the patient is an MRI candidate, pancreatic protocol MRI with
MRCP (without with contrast) suggested. Alternatively, and less
favored, pancreatic protocol dedicated CT may be informative.
4. Cholelithiasis.
5. Bibasilar lung nodules. If the patient is at high risk for
bronchogenic carcinoma, follow-up chest CT at 6-12 months is
recommended. If the patient is at low risk for bronchogenic
carcinoma, follow-up chest CT at 12 months is recommended. This
recommendation follows the consensus statement: "Guidelines for
Management of Small Pulmonary Nodules Detected on CT Scans: A
Statement from the [HOSPITAL]" as published in
[employer];[DATE]. Online at:
[URL]

## 2017-04-28 ENCOUNTER — Encounter: Payer: Self-pay | Admitting: Family Medicine

## 2017-04-28 ENCOUNTER — Ambulatory Visit (INDEPENDENT_AMBULATORY_CARE_PROVIDER_SITE_OTHER): Payer: Medicare HMO | Admitting: Family Medicine

## 2017-04-28 VITALS — BP 120/70 | HR 78 | Ht <= 58 in | Wt 124.0 lb

## 2017-04-28 DIAGNOSIS — I5022 Chronic systolic (congestive) heart failure: Secondary | ICD-10-CM | POA: Diagnosis not present

## 2017-04-28 LAB — BASIC METABOLIC PANEL
BUN: 28 mg/dL — ABNORMAL HIGH (ref 7–25)
CHLORIDE: 106 mmol/L (ref 98–110)
CO2: 25 mmol/L (ref 20–32)
Calcium: 9 mg/dL (ref 8.6–10.4)
Creat: 1.1 mg/dL — ABNORMAL HIGH (ref 0.60–0.88)
Glucose, Bld: 101 mg/dL — ABNORMAL HIGH (ref 65–99)
Potassium: 3.6 mmol/L (ref 3.5–5.3)
SODIUM: 144 mmol/L (ref 135–146)

## 2017-04-28 NOTE — Progress Notes (Signed)
   Subjective:    Patient ID: Connie [name], female    DOB: Nov 30, 1933, 83 y.o.   MRN: [id number]  HPI She is here for recheck. She is now taking 80 mg of Lasix and her weight is down to her normal rate of 124 and she does feel much better at this weight in terms of her energy and breathing. He does have an appointment with cardiology scheduled for later this month.   Review of Systems     Objective:   Physical Exam alert and in no distress. Exam of the legs does show much less edema.      Assessment & Plan:  Chronic systolic CHF (congestive heart failure) ([name]) - Plan: Basic metabolic panel Her potassium was low prior to this so she might need to be supplemented.

## 2017-05-02 ENCOUNTER — Telehealth: Payer: Self-pay

## 2017-05-02 NOTE — Telephone Encounter (Signed)
[employer] called in reference # [id number], needing verbal scripts for pt to get Accu-chek Guide meter and fast-click lancets with accu-chek test strips.   Verbal given to pharmacist Crissy for 90 day supply with 1 year refill. Victorino December

## 2017-05-04 DIAGNOSIS — L570 Actinic keratosis: Secondary | ICD-10-CM | POA: Diagnosis not present

## 2017-05-04 DIAGNOSIS — K31811 Angiodysplasia of stomach and duodenum with bleeding: Secondary | ICD-10-CM | POA: Diagnosis not present

## 2017-05-04 DIAGNOSIS — I5020 Unspecified systolic (congestive) heart failure: Secondary | ICD-10-CM | POA: Diagnosis not present

## 2017-05-04 DIAGNOSIS — D500 Iron deficiency anemia secondary to blood loss (chronic): Secondary | ICD-10-CM | POA: Diagnosis not present

## 2017-05-10 ENCOUNTER — Encounter: Payer: Self-pay | Admitting: Cardiology

## 2017-05-10 ENCOUNTER — Ambulatory Visit (INDEPENDENT_AMBULATORY_CARE_PROVIDER_SITE_OTHER): Payer: Medicare HMO | Admitting: Cardiology

## 2017-05-10 ENCOUNTER — Encounter (INDEPENDENT_AMBULATORY_CARE_PROVIDER_SITE_OTHER): Payer: Self-pay

## 2017-05-10 VITALS — BP 166/60 | HR 65 | Ht <= 58 in | Wt 126.6 lb

## 2017-05-10 DIAGNOSIS — I428 Other cardiomyopathies: Secondary | ICD-10-CM

## 2017-05-10 DIAGNOSIS — I10 Essential (primary) hypertension: Secondary | ICD-10-CM

## 2017-05-10 DIAGNOSIS — D649 Anemia, unspecified: Secondary | ICD-10-CM

## 2017-05-10 DIAGNOSIS — R600 Localized edema: Secondary | ICD-10-CM

## 2017-05-10 DIAGNOSIS — E1159 Type 2 diabetes mellitus with other circulatory complications: Secondary | ICD-10-CM | POA: Diagnosis not present

## 2017-05-10 LAB — BASIC METABOLIC PANEL
BUN/Creatinine Ratio: 23 (ref 12–28)
BUN: 24 mg/dL (ref 8–27)
CHLORIDE: 104 mmol/L (ref 96–106)
CO2: 25 mmol/L (ref 20–29)
CREATININE: 1.04 mg/dL — AB (ref 0.57–1.00)
Calcium: 8.8 mg/dL (ref 8.7–10.3)
GFR, EST AFRICAN AMERICAN: 58 mL/min/{1.73_m2} — AB (ref >59)
GFR, EST NON AFRICAN AMERICAN: 51 mL/min/{1.73_m2} — AB (ref >59)
Glucose: 131 mg/dL — ABNORMAL HIGH (ref 65–99)
Potassium: 3.8 mmol/L (ref 3.5–5.2)
SODIUM: 145 mmol/L — AB (ref 134–144)

## 2017-05-10 LAB — CBC
HEMOGLOBIN: 10.8 g/dL — AB (ref 11.1–15.9)
Hematocrit: 33.4 % — ABNORMAL LOW (ref 34.0–46.6)
MCH: 29.3 pg (ref 26.6–33.0)
MCHC: 32.3 g/dL (ref 31.5–35.7)
MCV: 91 fL (ref 79–97)
Platelets: 277 10*3/uL (ref 150–379)
RBC: 3.69 x10E6/uL — ABNORMAL LOW (ref 3.77–5.28)
RDW: 13.2 % (ref 12.3–15.4)
WBC: 6.5 10*3/uL (ref 3.4–10.8)

## 2017-05-10 MED ORDER — SACUBITRIL-VALSARTAN 49-51 MG PO TABS: 1.0000 | ORAL_TABLET | Freq: Two times a day (BID) | ORAL | 3 refills | Status: DC

## 2017-05-10 NOTE — Patient Instructions (Addendum)
Medication Instructions:  Your physician recommends that you continue on your current medications as directed. Please refer to the Current Medication list given to you today.   Labwork: TODAY:  BMET & CBC  Testing/Procedures: .Your physician has requested that you have an echocardiogram. Echocardiography is a painless test that uses sound waves to create images of your heart. It provides your doctor with information about the size and shape of your heart and how well your heart's chambers and valves are working. This procedure takes approximately one hour. There are no restrictions for this procedure.   Follow-Up: Your physician recommends that you schedule a follow-up appointment in: [place], NP   Any Other Special Instructions Will Be Listed Below (If Applicable).  Echocardiogram An echocardiogram, or echocardiography, uses sound waves (ultrasound) to produce an image of your heart. The echocardiogram is simple, painless, obtained within a short period of time, and offers valuable information to your health care provider. The images from an echocardiogram can provide information such as:  Evidence of coronary artery disease (CAD).  Heart size.  Heart muscle function.  Heart valve function.  Aneurysm detection.  Evidence of a past heart attack.  Fluid buildup around the heart.  Heart muscle thickening.  Assess heart valve function.  Tell a health care provider about:  Any allergies you have.  All medicines you are taking, including vitamins, herbs, eye drops, creams, and over-the-counter medicines.  Any problems you or family members have had with anesthetic medicines.  Any blood disorders you have.  Any surgeries you have had.  Any medical conditions you have.  Whether you are pregnant or may be pregnant. What happens before the procedure? No special preparation is needed. Eat and drink normally. What happens during the procedure?  In order  to produce an image of your heart, gel will be applied to your chest and a wand-like tool (transducer) will be moved over your chest. The gel will help transmit the sound waves from the transducer. The sound waves will harmlessly bounce off your heart to allow the heart images to be captured in real-time motion. These images will then be recorded.  You may need an IV to receive a medicine that improves the quality of the pictures. What happens after the procedure? You may return to your normal schedule including diet, activities, and medicines, unless your health care provider tells you otherwise. This information is not intended to replace advice given to you by your health care provider. Make sure you discuss any questions you have with your health care provider. Document Released: 09/03/2000 Document Revised: 04/24/2016 Document Reviewed: 05/14/2013 Elsevier Interactive Patient Education  2017 [employer].     If you need a refill on your cardiac medications before your next appointment, please call your pharmacy.

## 2017-05-10 NOTE — Progress Notes (Signed)
Cardiology Office Note   Date:  05/10/2017   ID:  Connie [name], DOB 09-20-1934, MRN [id number]  PCP:  [name], MD  Cardiologist:  Dr. Marlou Porch    Chief Complaint  Patient presents with  . Leg Swelling      History of Present Illness: Connie [name] is a 83 y.o. female who presents for leg swelling, the left mostly.  Dr. Redmond School increased her lasix to 80 mg daily.  Swelling has improved.  The [place] began at the beach. She denies much salt.   She has a hx of nonischemic cardiomyopathy admitted in March 2017 with acute heart failure exacerbation. Had AVMs, chronic anemia. She had transient BiPAP use. Her ejection fraction on 12/17/15 was 35-40% with mild mitral regurgitation.  Unable to take aspirin and Plavix because of gastric AVMs. Thankfully, diagnostic cardiac catheterization performed in March 2017 showed no coronary disease. Troponin was minimally elevated secondary to heart failure at 0.12.  Anemia followed by the cancer center,.most recent hgb is 10.6,    She is on entresto.  Diabetes followed by Dr. Redmond School   Today no chest pain or SOB, but continues with some lower ext edema.  No pain in her legs.  She is having cataract removed in next few weeks.  She is planning to go to visit her sons out of town when she is more stable.  She did discuss that with her husband's death she is finding her own way and that is not always easy.    She has not yet had her BP meds today.    Past Medical History:  Diagnosis Date  . Allergy    RHINITIS  . Angiodysplasia of duodenum    hx/notes 11/21/2015  . Angiodysplasia of stomach    hx/notes 11/21/2015  . Arthritis    "joints ache"  . Bleeding gastric ulcer    hx/notes 11/21/2015  . Chronic blood loss anemia    secondary to angiodysplasia of the stomach and duodenum as well as history of bleeding gastric ulcer hx/notes 11/21/2015  . Chronic systolic CHF (congestive heart failure) ([place]) 12/23/2015  . History of blood  transfusion 01/2015; 06/2015; 11/21/2015  . Hypercholesteremia   . Hypertension   . Hypothyroidism   . Obesity   . Pneumonia "several times"  . Type II diabetes mellitus ([place])     Past Surgical History:  Procedure Laterality Date  . CARDIAC CATHETERIZATION N/A 12/19/2015   Procedure: Left Heart Cath and Coronary Angiography;  Surgeon: Leonie Man, MD;  Location: [facility];  Service: Cardiovascular;  Laterality: N/A;  . COLONOSCOPY N/A 08/30/2014   Procedure: COLONOSCOPY;  Surgeon: Beryle Beams, MD;  Location: [facility] ENDOSCOPY;  Service: Endoscopy;  Laterality: N/A;  . COLONOSCOPY N/A 02/21/2015   Procedure: COLONOSCOPY;  Surgeon: Carol Ada, MD;  Location: [facility] ENDOSCOPY;  Service: Endoscopy;  Laterality: N/A;  . ENTEROSCOPY N/A 06/06/2015   Procedure: ENTEROSCOPY;  Surgeon: Carol Ada, MD;  Location: [facility] ENDOSCOPY;  Service: Endoscopy;  Laterality: N/A;  . FRACTURE SURGERY    . GIVENS CAPSULE STUDY N/A 05/15/2015   Procedure: GIVENS CAPSULE STUDY;  Surgeon: Carol Ada, MD;  Location: [facility] ENDOSCOPY;  Service: Endoscopy;  Laterality: N/A;  . HOT HEMOSTASIS N/A 08/30/2014   Procedure: HOT HEMOSTASIS (ARGON PLASMA COAGULATION/BICAP);  Surgeon: Beryle Beams, MD;  Location: [facility] END[facility];  Service: Endoscopy;  Laterality: N/A;  . HOT HEMOSTASIS N/A 06/06/2015   Procedure: HOT HEMOSTASIS (ARGON PLASMA COAGULATION/BICAP);  Surgeon: Carol Ada,  MD;  Location: [facility] ENDOSCOPY;  Service: Endoscopy;  Laterality: N/A;  . JOINT REPLACEMENT    . REVISION TOTAL KNEE ARTHROPLASTY Bilateral 2004-2015  . SHOULDER OPEN ROTATOR CUFF REPAIR Right 12/2004   open subacromial decompression, distal clavicle  resection, rotator cuff repair/notes 02/02/2011  . TONSILLECTOMY  1940s  . TOTAL KNEE ARTHROPLASTY Bilateral ~ 2002  . TOTAL THYROIDECTOMY  ~ 1966     Current Outpatient Prescriptions  Medication Sig Dispense Refill  . ACCU-CHEK FASTCLIX LANCETS MISC 1 each by Does not apply route 2 (two) times  daily. DX E11.9 204 each 3  . amLODipine (NORVASC) 10 MG tablet Take 1 tablet (10 mg total) by mouth daily. 90 tablet 1  . atorvastatin (LIPITOR) 40 MG tablet TAKE ONE TABLET BY MOUTH ONCE DAILY 90 tablet 3  . Blood Glucose Monitoring Suppl (ACCU-CHEK GUIDE) w/Device KIT 1 each by Does not apply route 2 (two) times daily. DX E11.9 1 kit 0  . carvedilol (COREG) 12.5 MG tablet Take 1 tablet (12.5 mg total) by mouth 2 (two) times daily with a meal. 180 tablet 3  . ferrous sulfate 325 (65 FE) MG tablet Take 1 tablet (325 mg total) by mouth 3 (three) times daily with meals. 90 tablet 0  . furosemide (LASIX) 40 MG tablet Take 40 mg by mouth 2 (two) times daily.    . glucose blood (ACCU-CHEK GUIDE) test strip Patient to test 2 times daily DX E11.9 Use as instructed 200 each 3  . Lancets Misc. (ACCU-CHEK FASTCLIX LANCET) KIT 1 each by Does not apply route 2 (two) times daily. DX E11.9 1 kit 0  . levothyroxine (SYNTHROID, LEVOTHROID) 125 MCG tablet Take 125 mcg by mouth daily before breakfast.    . Multiple Vitamins-Minerals (WOMENS MULTIVITAMIN PLUS PO) Take 1 tablet by mouth daily.    . pantoprazole (PROTONIX) 20 MG tablet Take 1 tablet (20 mg total) by mouth daily. 90 tablet 3  . pioglitazone (ACTOS) 30 MG tablet Take 1 tablet (30 mg total) by mouth daily. 90 tablet 1  . sacubitril-valsartan (ENTRESTO) 49-51 MG Take 1 tablet by mouth 2 (two) times daily. 180 tablet 3  . potassium chloride (K-DUR) 10 MEQ tablet Take 1 tablet (10 mEq total) by mouth as directed. Take 10 meq twice a day every Mon, Wed and Fri's     No current facility-administered medications for this visit.     Allergies:   Patient has no known allergies.    Social History:  The patient  reports that she has never smoked. She has never used smokeless tobacco. She reports that she does not drink alcohol or use drugs.   Family History:  The patient's family history includes Anemia in her sister; Asthma in her mother; Ulcers in her  father.    ROS:  General:no colds or fevers, no weight changes Skin:no rashes or ulcers HEENT:no blurred vision, no congestion CV:see HPI PUL:see HPI GI:no diarrhea constipation or melena, no indigestion GU:no hematuria, no dysuria MS:no joint pain, no claudication Neuro:no syncope, no lightheadedness Endo:no diabetes, no thyroid disease Wt Readings from Last 3 Encounters:  05/10/17 126 lb 9.6 oz (57.4 kg)  04/28/17 124 lb (56.2 kg)  04/21/17 129 lb (58.5 kg)     PHYSICAL EXAM: VS:  BP (!) 166/60   Pulse 65   Ht 4' 10" (1.473 m)   Wt 126 lb 9.6 oz (57.4 kg)   SpO2 98%   BMI 26.46 kg/m  , BMI Body mass index is 26.46   kg/m. General:Pleasant affect, NAD Skin:Warm and dry, brisk capillary refill HEENT:normocephalic, sclera clear, mucus membranes moist Neck:supple, no JVD, no bruits  Heart:S1S2 RRR without murmur, gallup, rub or click Lungs:clear without rales, rhonchi, or wheezes Abd:soft, non tender, + BS, do not palpate liver spleen or masses Ext:no lower ext edema, 2+ pedal pulses, 2+ radial pulses Neuro:alert and oriented, MAE, follows commands, + facial symmetry    EKG:  EKG is NOT ordered today.   Recent Labs: 10/20/2016: B Natriuretic Peptide 290.7 12/01/2016: TSH 0.856 04/21/2017: ALT 5 05/10/2017: BUN 24; Creatinine, Ser 1.04; Hemoglobin 10.8; Platelets 277; Potassium 3.8; Sodium 145    Lipid Panel    Component Value Date/Time   CHOL 160 10/19/2016 1147   TRIG 109 10/19/2016 1147   HDL 57 10/19/2016 1147   CHOLHDL 2.8 10/19/2016 1147   VLDL 22 10/19/2016 1147   LDLCALC 81 10/19/2016 1147       Other studies Reviewed: Additional studies/ records that were reviewed today include: . 12/19/15 cardiac cath  Conclusion   1. Ost LM to LM lesion, 40% stenosed. 2. Ost RCA lesion, 20% stenosed. 3. There is moderate left ventricular systolic dysfunction.    Likely nonischemic cardiomyopathy. There is coronary calcification noted in both the ostial RCA and  Left Main, but no significant stenosis noted.   Patient still has somewhat elevated LVEDP with mildly improved EF compared to echocardiogram.     Echo 12/17/15  Study Conclusions  - Left ventricle: The cavity size was moderately dilated. Wall   thickness was normal. Systolic function was moderately reduced.   The estimated ejection fraction was in the range of 35% to 40%.   Diffuse hypokinesis. Doppler parameters are consistent with both   elevated ventricular end-diastolic filling pressure and elevated   left atrial filling pressure. - Aortic valve: There was trivial regurgitation. - Mitral valve: There was mild regurgitation. - Atrial septum: No defect or patent foramen ovale was identified. - Pulmonary arteries: PA peak pressure: 37 mm Hg (S). - Pericardium, extracardiac: Small posterior pericardial effusion    ASSESSMENT AND PLAN:  1.  NICM on Entresto and lasix with increased edema.  She has been at the beach and may have had more salt but will continue lasix 80 mg daily for now and check BMP.  Will also check her echo to re-evaluate her EF.  If decreased would increase entresto.   I will see her back in 1 month to review though if symptoms increase she will call.   2.  Non obst CAD on cath 12/19/15 with decrease in EF on cath 35-45%  3.  Hx of anemia and with edema will recheck CBC.    4. HTN elevated today but has not had her meds.   Current medicines are reviewed with the patient today.  The patient Has no concerns regarding medicines.  The following changes have been made:  See above Labs/ tests ordered today include:see above  Disposition:   FU:  see above  Signed,  , NP  05/10/2017 6:12 PM    [facility] [address], [place], [place]  [address]/ [address] [place], [place] Phone: [phone]; Fax: [phone]  [phone] 

## 2017-05-11 ENCOUNTER — Telehealth: Payer: Self-pay | Admitting: Cardiology

## 2017-05-11 MED ORDER — FUROSEMIDE 40 MG PO TABS: 40.0000 mg | ORAL_TABLET | Freq: Every day | ORAL | 3 refills | Status: DC

## 2017-05-11 NOTE — Telephone Encounter (Signed)
New message    Pt is calling about her lab results.

## 2017-05-11 NOTE — Telephone Encounter (Signed)
-----   Message from Isaiah Serge, NP sent at 05/10/2017  7:29 PM EDT ----- Labs are stable, decrease lasix to 40 mg on Friday.  Let us know if edema increases.

## 2017-05-13 ENCOUNTER — Ambulatory Visit: Payer: Medicare HMO | Admitting: Cardiology

## 2017-05-16 ENCOUNTER — Other Ambulatory Visit: Payer: Self-pay

## 2017-05-16 ENCOUNTER — Telehealth: Payer: Self-pay | Admitting: Family Medicine

## 2017-05-16 MED ORDER — PIOGLITAZONE HCL 30 MG PO TABS: 30.0000 mg | ORAL_TABLET | Freq: Every day | ORAL | 1 refills | Status: DC

## 2017-05-16 NOTE — Telephone Encounter (Signed)
Pt called for refills of actos. Please send to [facility] mail order.

## 2017-05-16 NOTE — Telephone Encounter (Signed)
Med sent in.

## 2017-05-18 ENCOUNTER — Other Ambulatory Visit: Payer: Self-pay

## 2017-05-18 ENCOUNTER — Ambulatory Visit (HOSPITAL_COMMUNITY): Payer: Medicare HMO | Attending: Cardiology

## 2017-05-18 DIAGNOSIS — E785 Hyperlipidemia, unspecified: Secondary | ICD-10-CM | POA: Diagnosis not present

## 2017-05-18 DIAGNOSIS — R600 Localized edema: Secondary | ICD-10-CM

## 2017-05-18 DIAGNOSIS — I2510 Atherosclerotic heart disease of native coronary artery without angina pectoris: Secondary | ICD-10-CM | POA: Insufficient documentation

## 2017-05-18 DIAGNOSIS — I080 Rheumatic disorders of both mitral and aortic valves: Secondary | ICD-10-CM | POA: Diagnosis not present

## 2017-05-18 DIAGNOSIS — I509 Heart failure, unspecified: Secondary | ICD-10-CM | POA: Diagnosis not present

## 2017-05-18 DIAGNOSIS — E119 Type 2 diabetes mellitus without complications: Secondary | ICD-10-CM | POA: Insufficient documentation

## 2017-05-18 DIAGNOSIS — I110 Hypertensive heart disease with heart failure: Secondary | ICD-10-CM | POA: Diagnosis not present

## 2017-05-18 DIAGNOSIS — I428 Other cardiomyopathies: Secondary | ICD-10-CM | POA: Insufficient documentation

## 2017-05-19 DIAGNOSIS — H353212 Exudative age-related macular degeneration, right eye, with inactive choroidal neovascularization: Secondary | ICD-10-CM | POA: Diagnosis not present

## 2017-05-27 DIAGNOSIS — H2511 Age-related nuclear cataract, right eye: Secondary | ICD-10-CM | POA: Diagnosis not present

## 2017-05-27 LAB — HM DIABETES EYE EXAM

## 2017-05-30 ENCOUNTER — Telehealth: Payer: Self-pay | Admitting: Family Medicine

## 2017-05-30 NOTE — Telephone Encounter (Signed)
Pt aware. Connie [name]

## 2017-05-30 NOTE — Telephone Encounter (Signed)
Pt states she had labs done 05/18/17 & HGB was 10.8 and has surgery Monday in [place] for cataract.  She has appt here 06/16/17.  She wants to know if she needs to come in this week about her HGB being low or if this is ok, she says she feels fine, but wanted to ask you with her surgery coming up Monday.

## 2017-05-30 NOTE — Telephone Encounter (Signed)
If she is feeling okay, let's leave her alone

## 2017-05-31 ENCOUNTER — Other Ambulatory Visit: Payer: Self-pay | Admitting: Cardiology

## 2017-05-31 ENCOUNTER — Other Ambulatory Visit: Payer: Self-pay | Admitting: Family Medicine

## 2017-05-31 DIAGNOSIS — I428 Other cardiomyopathies: Secondary | ICD-10-CM

## 2017-06-06 DIAGNOSIS — H2511 Age-related nuclear cataract, right eye: Secondary | ICD-10-CM | POA: Diagnosis not present

## 2017-06-06 DIAGNOSIS — H52222 Regular astigmatism, left eye: Secondary | ICD-10-CM | POA: Diagnosis not present

## 2017-06-06 DIAGNOSIS — H25811 Combined forms of age-related cataract, right eye: Secondary | ICD-10-CM | POA: Diagnosis not present

## 2017-06-06 DIAGNOSIS — H5202 Hypermetropia, left eye: Secondary | ICD-10-CM | POA: Diagnosis not present

## 2017-06-06 DIAGNOSIS — Z961 Presence of intraocular lens: Secondary | ICD-10-CM | POA: Diagnosis not present

## 2017-06-07 ENCOUNTER — Other Ambulatory Visit: Payer: Medicare HMO

## 2017-06-07 ENCOUNTER — Ambulatory Visit: Payer: Medicare HMO | Admitting: Hematology

## 2017-06-08 ENCOUNTER — Other Ambulatory Visit: Payer: Self-pay | Admitting: Cardiology

## 2017-06-08 DIAGNOSIS — I428 Other cardiomyopathies: Secondary | ICD-10-CM

## 2017-06-10 ENCOUNTER — Ambulatory Visit: Payer: Medicare HMO | Admitting: Cardiology

## 2017-06-13 ENCOUNTER — Ambulatory Visit: Payer: Medicare HMO | Admitting: Hematology

## 2017-06-13 ENCOUNTER — Other Ambulatory Visit: Payer: Medicare HMO

## 2017-06-13 ENCOUNTER — Telehealth: Payer: Self-pay

## 2017-06-13 NOTE — Telephone Encounter (Signed)
Appointment for today was supposed to be cancelled. Patient already rescheduled for lab and doctor on 06/20/17. Verified date and time with patient who verbalized agreement.

## 2017-06-15 NOTE — Progress Notes (Signed)
Connie [name]    HEMATOLOGY/ONCOLOGY CLINIC NOTE  Date of Service: 06/20/17  Patient Care Team: [name], MD as PCP - General (Family Medicine)  CHIEF COMPLAINTS/PURPOSE OF CONSULTATION:  Iron deficiency anemia due to GI bleeding from multiple AVMs  DIAGNOSIS: Iron deficiency anemia likely related to ongoing chronic GI bleeding from multiple AVMs  TREATMENT -IV iron replacement with injectafer as needed (tolerated well). (Had some shortness of breath with IV Feraheme - which was likely fluid overload as opposed to a true allergic reaction but patient has been hesitant to take this)  HISTORY OF PRESENTING ILLNESS:  Please see previous note for details  INTERVAL HISTORY  Connie [name] is here for followup for her IDA. She received IV Injectafer 750 mg every weekly 2 doses about 3 months ago and tolerated it well. She is accompanied by her son. She notes that she has been doing well overall. She reports that she is still grieving her husband's death. Pt notes that she had a right eye cataract surgery recently. Pt has a hx of macular degeneration and was treated with injections for the past year. She reports that she has a follow up appointment with her opthalmologist soon. She denies major medication changes since her last visit and reports that she used to take OTC iron pills.   On review of systems, pt denies blood in stool or abdominal pain. She reports intermittent decreased energy levels. She denies vision change. She denies mouth sores. She denies CP or SOB.     MEDICAL HISTORY:  Past Medical History:  Diagnosis Date  . Allergy    RHINITIS  . Angiodysplasia of duodenum    hx/notes 11/21/2015  . Angiodysplasia of stomach    hx/notes 11/21/2015  . Arthritis    "joints ache"  . Bleeding gastric ulcer    hx/notes 11/21/2015  . Chronic blood loss anemia    secondary to angiodysplasia of the stomach and duodenum as well as history of bleeding gastric ulcer hx/notes 11/21/2015  . Chronic  systolic CHF (congestive heart failure) ([place]) 12/23/2015  . History of blood transfusion 01/2015; 06/2015; 11/21/2015  . Hypercholesteremia   . Hypertension   . Hypothyroidism   . Obesity   . Pneumonia "several times"  . Type II diabetes mellitus ([place])     SURGICAL HISTORY: Past Surgical History:  Procedure Laterality Date  . CARDIAC CATHETERIZATION N/A 12/19/2015   Procedure: Left Heart Cath and Coronary Angiography;  Surgeon: Leonie Man, MD;  Location: [facility] CV LAB;  Service: Cardiovascular;  Laterality: N/A;  . COLONOSCOPY N/A 08/30/2014   Procedure: COLONOSCOPY;  Surgeon: Beryle Beams, MD;  Location: [facility] ENDOSCOPY;  Service: Endoscopy;  Laterality: N/A;  . COLONOSCOPY N/A 02/21/2015   Procedure: COLONOSCOPY;  Surgeon: Carol Ada, MD;  Location: [facility] ENDOSCOPY;  Service: Endoscopy;  Laterality: N/A;  . ENTEROSCOPY N/A 06/06/2015   Procedure: ENTEROSCOPY;  Surgeon: Carol Ada, MD;  Location: [facility] ENDOSCOPY;  Service: Endoscopy;  Laterality: N/A;  . FRACTURE SURGERY    . GIVENS CAPSULE STUDY N/A 05/15/2015   Procedure: GIVENS CAPSULE STUDY;  Surgeon: Carol Ada, MD;  Location: [facility] ENDOSCOPY;  Service: Endoscopy;  Laterality: N/A;  . HOT HEMOSTASIS N/A 08/30/2014   Procedure: HOT HEMOSTASIS (ARGON PLASMA COAGULATION/BICAP);  Surgeon: Beryle Beams, MD;  Location: [facility] ENDOSCOPY;  Service: Endoscopy;  Laterality: N/A;  . HOT HEMOSTASIS N/A 06/06/2015   Procedure: HOT HEMOSTASIS (ARGON PLASMA COAGULATION/BICAP);  Surgeon: Carol Ada, MD;  Location: [facility] ENDOSCOPY;  Service: Endoscopy;  Laterality: N/A;  . JOINT REPLACEMENT    . REVISION TOTAL KNEE ARTHROPLASTY Bilateral 2004-2015  . SHOULDER OPEN ROTATOR CUFF REPAIR Right 12/2004   open subacromial decompression, distal clavicle  resection, rotator cuff repair/notes 02/02/2011  . TONSILLECTOMY  1940s  . TOTAL KNEE ARTHROPLASTY Bilateral ~ 2002  . TOTAL THYROIDECTOMY  ~ 1966    SOCIAL HISTORY: Social History   Social History  .  Marital status: Married    Spouse name: N/A  . Number of children: N/A  . Years of education: N/A   Occupational History  . Not on file.   Social History Main Topics  . Smoking status: Never Smoker  . Smokeless tobacco: Never Used  . Alcohol use No  . Drug use: No  . Sexual activity: Not Currently   Other Topics Concern  . Not on file   Social History Narrative  . No narrative on file    FAMILY HISTORY: Family History  Problem Relation Age of Onset  . Asthma Mother   . Ulcers Father   . Anemia Sister     ALLERGIES:  has No Known Allergies.  MEDICATIONS:  Current Outpatient Prescriptions  Medication Sig Dispense Refill  . ACCU-CHEK FASTCLIX LANCETS MISC 1 each by Does not apply route 2 (two) times daily. DX E11.9 204 each 3  . amLODipine (NORVASC) 10 MG tablet Take 1 tablet (10 mg total) by mouth daily. 90 tablet 1  . atorvastatin (LIPITOR) 40 MG tablet TAKE ONE TABLET BY MOUTH ONCE DAILY 90 tablet 3  . Blood Glucose Monitoring Suppl (ACCU-CHEK GUIDE) w/Device KIT 1 each by Does not apply route 2 (two) times daily. DX E11.9 1 kit 0  . carvedilol (COREG) 12.5 MG tablet Take 1 tablet (12.5 mg total) by mouth 2 (two) times daily with a meal. 180 tablet 3  . ENTRESTO 49-51 MG TAKE 1 TABLET TWICE DAILY 180 tablet 3  . furosemide (LASIX) 40 MG tablet Take 1 tablet (40 mg total) by mouth daily. (Patient taking differently: Take 40 mg by mouth 2 (two) times daily. ) 90 tablet 3  . glucose blood (ACCU-CHEK GUIDE) test strip Patient to test 2 times daily DX E11.9 Use as instructed 200 each 3  . Lancets Misc. (ACCU-CHEK FASTCLIX LANCET) KIT 1 each by Does not apply route 2 (two) times daily. DX E11.9 1 kit 0  . levothyroxine (SYNTHROID, LEVOTHROID) 125 MCG tablet Take 125 mcg by mouth daily before breakfast.    . Multiple Vitamins-Minerals (WOMENS MULTIVITAMIN PLUS PO) Take 1 tablet by mouth daily.    . pantoprazole (PROTONIX) 20 MG tablet Take 1 tablet (20 mg total) by mouth daily.  90 tablet 3  . pioglitazone (ACTOS) 30 MG tablet Take 1 tablet (30 mg total) by mouth daily. 90 tablet 1  . potassium chloride (K-DUR,KLOR-CON) 10 MEQ tablet TAKE 1 TABLET TWICE DAILY 180 tablet 1  . ferrous sulfate 325 (65 FE) MG tablet Take 1 tablet (325 mg total) by mouth 3 (three) times daily with meals. (Patient not taking: Reported on 06/20/2017) 90 tablet 0  . potassium chloride (K-DUR) 10 MEQ tablet Take 1 tablet (10 mEq total) by mouth as directed. Take 10 meq twice a day every Mon, Wed and Fri's     No current facility-administered medications for this visit.     REVIEW OF SYSTEMS:    10 Point review of Systems was done is negative except as noted above.  PHYSICAL EXAMINATION:  ECOG PERFORMANCE STATUS: 1 - Symptomatic but  completely ambulatory  . Vitals:   06/20/17 1347  BP: (!) 146/35  Pulse: (!) 58  Resp: 16  Temp: 98 F (36.7 C)  SpO2: 99%   Filed Weights   06/20/17 1347  Weight: 125 lb 9.6 oz (57 kg)   .Body mass index is 26.25 kg/m.  GENERAL:alert, in no acute distress and comfortable SKIN: skin color, texture, turgor are normal, no rashes or significant lesions. Redness noted to right shin from probable contact dermatitis.  EYES: normal, conjunctiva are pink and non-injected, sclera clear OROPHARYNX:no exudate, no erythema and lips, buccal mucosa, and tongue normal  NECK: supple, no JVD, thyroid normal size, non-tender, without nodularity LYMPH:  no palpable lymphadenopathy in the cervical, axillary or inguinal LUNGS: clear to auscultation with normal respiratory effort HEART: regular rate & rhythm,  no murmurs and 1+ lower extremity edema ABDOMEN: abdomen soft, non-tender, normoactive bowel sounds ,  no palpable hepatosplenomegaly Musculoskeletal: no cyanosis of digits and no clubbing  PSYCH: alert & oriented x 3 with fluent speech NEURO: no focal motor/sensory deficits  LABORATORY DATA:    CBC Latest Ref Rng & Units 06/20/2017 06/16/2017 05/10/2017  WBC  3.9 - 10.3 10e3/uL 7.2 6.9 6.5  Hemoglobin 11.6 - 15.9 g/dL 10.8(L) 10.7(L) 10.8(L)  Hematocrit 34.8 - 46.6 % 34.9 33.0(L) 33.4(L)  Platelets 145 - 400 10e3/uL 169 223 277    . CBC    Component Value Date/Time   WBC 7.2 06/20/2017 1326   WBC 6.9 06/16/2017 1041   RBC 3.68 (L) 06/20/2017 1326   RBC 3.63 (L) 06/16/2017 1041   HGB 10.8 (L) 06/20/2017 1326   HCT 34.9 06/20/2017 1326   PLT 169 06/20/2017 1326   PLT 277 05/10/2017 1120   MCV 94.8 06/20/2017 1326   MCH 29.3 06/20/2017 1326   MCH 29.5 06/16/2017 1041   MCHC 30.9 (L) 06/20/2017 1326   MCHC 32.4 06/16/2017 1041   RDW 14.2 06/20/2017 1326   LYMPHSABS 0.6 (L) 06/20/2017 1326   MONOABS 0.7 06/20/2017 1326   EOSABS 0.5 06/20/2017 1326   BASOSABS 0.0 06/20/2017 1326    . CMP Latest Ref Rng & Units 06/20/2017 05/10/2017 04/28/2017  Glucose 70 - 140 mg/dl 150(H) 131(H) 101(H)  BUN 7.0 - 26.0 mg/dL 38.3(H) 24 28(H)  Creatinine 0.6 - 1.1 mg/dL 1.2(H) 1.04(H) 1.10(H)  Sodium 136 - 145 mEq/L 141 145(H) 144  Potassium 3.5 - 5.1 mEq/L 3.7 3.8 3.6  Chloride 96 - 106 mmol/L - 104 106  CO2 22 - 29 mEq/L '24 25 25  ' Calcium 8.4 - 10.4 mg/dL 9.0 8.8 9.0  Total Protein 6.4 - 8.3 g/dL 7.0 - -  Total Bilirubin 0.20 - 1.20 mg/dL 0.60 - -  Alkaline Phos 40 - 150 U/L 75 - -  AST 5 - 34 U/L 16 - -  ALT 0-55 U/L U/L <6 - -       ASSESSMENT & PLAN:   83 year old Caucasian female with  #1 Microcytic anemia likely related to iron deficiency from chronic GI bleeding. No known disorder causing chronic inflammation at this time. Patient's hemoglobin is a little lower aT `10.8 but has been stable for the last 3 months. Ferritin level  Today is adequate at 263 Patient received IV Injectafer weekly x 2 doses last about 3 months ago  #2 history of recurrent GI bleeding related to upper and lower GI tract AVMs and gastric erosions. Has had several admissions with requirement of multiple PRBC transfusions. Dropping ferritin suggests ongoing  slow losses. Hemoglobin  is relatively stable. No clinically overt GI bleeding noted.  #3 status post acute renal failure in January 2017 currently creatinine within normal limits-  #4 hypothyroidism on levothyroxine .  #5 ?? Reaction to [facility] - current hospital admission that shortness of breath appears to be more likely related to CHF decompensation than allergic reaction to Feraheme. However patient is very anxious and hesitant to get this preparation.  Plan  -patient is currently off ASA to try to reduce her risk of GI bleeding though she continues to remain at high-risk for GI bleeding given her extensive AVMs. -She reports no overt GI bleeding. -patient ferritin levels from today if > 100 and no additional IV Iron is indicated at this time. - and replace IV iron as needed to maintain ferritin levels of more than 100 -continue daily multivitamin orally. -continue ferrous sulfate 1 tab po daily for maintenance ( patient tolerating this without any GI issues) -Continue close followup with primary care physician for management of other medical issues. (HTN control)  Return to care with Dr. [name] in 4 months with repeat CBC, CMP and ferritin to reassess status of anemia. We shall determine need for additional IV iron prior to that based on ferritin levels.  All of the patients questions were answered with apparent satisfaction. The patient knows to call the clinic with any problems, questions or concerns.  I spent 15 minutes counseling the patient face to face. The total time spent in the appointment was 20 minutes and more than 50% was on counseling and direct patient cares.    Sullivan Lone MD [place] AAHIVMS [facility] Hematology/Oncology Physician [place]  (Office):       [phone] (Work cell):  [phone] (Fax):           [phone]  This document serves as a record of services personally performed by Sullivan Lone, MD. It was created on her behalf by Steva Colder, a trained medical scribe. The creation of this record is based on the scribe's personal observations and the provider's statements to them. This document has been checked and approved by the attending provider.

## 2017-06-16 ENCOUNTER — Ambulatory Visit (INDEPENDENT_AMBULATORY_CARE_PROVIDER_SITE_OTHER): Payer: Medicare HMO | Admitting: Family Medicine

## 2017-06-16 ENCOUNTER — Encounter: Payer: Self-pay | Admitting: Family Medicine

## 2017-06-16 VITALS — BP 132/62 | HR 68 | Ht <= 58 in | Wt 125.2 lb

## 2017-06-16 DIAGNOSIS — E1159 Type 2 diabetes mellitus with other circulatory complications: Secondary | ICD-10-CM | POA: Diagnosis not present

## 2017-06-16 DIAGNOSIS — I10 Essential (primary) hypertension: Secondary | ICD-10-CM

## 2017-06-16 DIAGNOSIS — E785 Hyperlipidemia, unspecified: Secondary | ICD-10-CM

## 2017-06-16 DIAGNOSIS — E039 Hypothyroidism, unspecified: Secondary | ICD-10-CM

## 2017-06-16 DIAGNOSIS — D500 Iron deficiency anemia secondary to blood loss (chronic): Secondary | ICD-10-CM

## 2017-06-16 DIAGNOSIS — I152 Hypertension secondary to endocrine disorders: Secondary | ICD-10-CM

## 2017-06-16 DIAGNOSIS — E1169 Type 2 diabetes mellitus with other specified complication: Secondary | ICD-10-CM | POA: Diagnosis not present

## 2017-06-16 DIAGNOSIS — E118 Type 2 diabetes mellitus with unspecified complications: Secondary | ICD-10-CM | POA: Diagnosis not present

## 2017-06-16 LAB — CBC WITH DIFFERENTIAL/PLATELET
BASOS ABS: 41 {cells}/uL (ref 0–200)
Basophils Relative: 0.6 %
EOS PCT: 3.4 %
Eosinophils Absolute: 235 cells/uL (ref 15–500)
HEMATOCRIT: 33 % — AB (ref 35.0–45.0)
Hemoglobin: 10.7 g/dL — ABNORMAL LOW (ref 11.7–15.5)
LYMPHS ABS: 476 {cells}/uL — AB (ref 850–3900)
MCH: 29.5 pg (ref 27.0–33.0)
MCHC: 32.4 g/dL (ref 32.0–36.0)
MCV: 90.9 fL (ref 80.0–100.0)
MPV: 12 fL (ref 7.5–12.5)
Monocytes Relative: 9.6 %
NEUTROS PCT: 79.5 %
Neutro Abs: 5486 cells/uL (ref 1500–7800)
Platelets: 223 10*3/uL (ref 140–400)
RBC: 3.63 10*6/uL — ABNORMAL LOW (ref 3.80–5.10)
RDW: 12.4 % (ref 11.0–15.0)
Total Lymphocyte: 6.9 %
WBC: 6.9 10*3/uL (ref 3.8–10.8)
WBCMIX: 662 {cells}/uL (ref 200–950)

## 2017-06-16 LAB — POCT GLYCOSYLATED HEMOGLOBIN (HGB A1C): HEMOGLOBIN A1C: 6.4

## 2017-06-16 NOTE — Progress Notes (Signed)
  Subjective:    Patient ID: Connie [name], female    DOB: 20-May-1934, 83 y.o.   MRN: [id number]  Connie [name] is a 83 y.o. female who presents for follow-up of Type 2 diabetes mellitus.  Home blood sugar records: fasting range: 150 Current symptoms/problems include none and have been unchanged. Daily foot checks:   Any foot concerns: no Exercise: walking Diet:no change She is scheduled to see hematology for her continued difficulty with anemia. She continues on her thyroid medication without trouble.she continues on her Actos for diabetes. She is also taking Norvasc, Entresto, Lasix as well as potassium supplementation.Continues on thyroid without trouble. The following portions of the patient's history were reviewed and updated as appropriate: allergies, current medications, past medical history, past social history and problem list.  ROS as in subjective above.     Objective:    Physical Exam Alert and in no distress otherwise not examined.  Blood pressure 132/62, pulse 68, height 4\' 10"  (1.473 m), weight 125 lb 3.2 oz (56.8 kg).  Lab Review Diabetic Labs Latest Ref Rng & Units 06/16/2017 05/10/2017 04/28/2017 04/21/2017 03/07/2017  HbA1c - 6.4 - - - -  Microalbumin mg/L - - - - -  Micro/Creat Ratio - - - - - -  Chol <200 mg/dL - - - - -  HDL >50 mg/dL - - - - -  Calc LDL <100 mg/dL - - - - -  Triglycerides <150 mg/dL - - - - -  Creatinine 0.57 - 1.00 mg/dL - 1.04(H) 1.10(H) 1.05(H) 1.1   BP/Weight 06/16/2017 05/10/2017 04/28/2017 04/21/2017 10/08/1476  Systolic BP 295 621 308 657 846  Diastolic BP 62 60 70 50 49  Wt. (Lbs) 125.2 126.6 124 129 124.1  BMI 26.17 26.46 25.92 26.96 25.94   Foot/eye exam completion dates Latest Ref Rng & Units 01/18/2017 12/07/2016  Eye Exam No Retinopathy Retinopathy(A) Retinopathy(A)  Foot Form Completion - - -   a1c is 6.4 Connie [name]  reports that she has never smoked. She has never used smokeless tobacco. She reports that she does not  drink alcohol or use drugs.     Assessment & Plan:    Type 2 diabetes mellitus with complication, without long-term current use of insulin ([name]) - Plan: HgB A1c  Iron deficiency anemia due to chronic blood loss  Hypothyroidism, unspecified type  Hyperlipidemia associated with type 2 diabetes mellitus ([place])  Hypertension associated with diabetes ([place])  1. Rx changes: none 2. Education: Reviewed 'ABCs' of diabetes management (respective goals in parentheses):  A1C (<7), blood pressure (<130/80), and cholesterol (LDL <100). 3. Compliance at present is estimated to be excellent. Efforts to improve compliance (if necessary) will be directed a tmaintaining present lifestyle 4. Follow up: 4 months Overall she seems be doing quite nicely. Her husband died approximately 10 months ago and she is handling this as well as can be expected.

## 2017-06-20 ENCOUNTER — Encounter: Payer: Self-pay | Admitting: Hematology

## 2017-06-20 ENCOUNTER — Encounter: Payer: Self-pay | Admitting: Family Medicine

## 2017-06-20 ENCOUNTER — Other Ambulatory Visit (HOSPITAL_BASED_OUTPATIENT_CLINIC_OR_DEPARTMENT_OTHER): Payer: Medicare HMO

## 2017-06-20 ENCOUNTER — Ambulatory Visit (HOSPITAL_BASED_OUTPATIENT_CLINIC_OR_DEPARTMENT_OTHER): Payer: Medicare HMO | Admitting: Hematology

## 2017-06-20 ENCOUNTER — Telehealth: Payer: Self-pay | Admitting: Hematology

## 2017-06-20 VITALS — BP 146/35 | HR 58 | Temp 98.0°F | Resp 16 | Ht <= 58 in | Wt 125.6 lb

## 2017-06-20 DIAGNOSIS — R0602 Shortness of breath: Secondary | ICD-10-CM

## 2017-06-20 DIAGNOSIS — K31811 Angiodysplasia of stomach and duodenum with bleeding: Secondary | ICD-10-CM

## 2017-06-20 DIAGNOSIS — D500 Iron deficiency anemia secondary to blood loss (chronic): Secondary | ICD-10-CM

## 2017-06-20 DIAGNOSIS — E039 Hypothyroidism, unspecified: Secondary | ICD-10-CM | POA: Diagnosis not present

## 2017-06-20 DIAGNOSIS — I10 Essential (primary) hypertension: Secondary | ICD-10-CM

## 2017-06-20 LAB — COMPREHENSIVE METABOLIC PANEL
ANION GAP: 11 meq/L (ref 3–11)
AST: 16 U/L (ref 5–34)
Albumin: 3.8 g/dL (ref 3.5–5.0)
Alkaline Phosphatase: 75 U/L (ref 40–150)
BUN: 38.3 mg/dL — ABNORMAL HIGH (ref 7.0–26.0)
CALCIUM: 9 mg/dL (ref 8.4–10.4)
CHLORIDE: 106 meq/L (ref 98–109)
CO2: 24 mEq/L (ref 22–29)
CREATININE: 1.2 mg/dL — AB (ref 0.6–1.1)
EGFR: 43 mL/min/{1.73_m2} — AB (ref >90)
Glucose: 150 mg/dl — ABNORMAL HIGH (ref 70–140)
POTASSIUM: 3.7 meq/L (ref 3.5–5.1)
Sodium: 141 mEq/L (ref 136–145)
Total Bilirubin: 0.6 mg/dL (ref 0.20–1.20)
Total Protein: 7 g/dL (ref 6.4–8.3)

## 2017-06-20 LAB — CBC & DIFF AND RETIC
BASO%: 0.4 % (ref 0.0–2.0)
BASOS ABS: 0 10*3/uL (ref 0.0–0.1)
EOS ABS: 0.5 10*3/uL (ref 0.0–0.5)
EOS%: 7.3 % — ABNORMAL HIGH (ref 0.0–7.0)
HEMATOCRIT: 34.9 % (ref 34.8–46.6)
HEMOGLOBIN: 10.8 g/dL — AB (ref 11.6–15.9)
IMMATURE RETIC FRACT: 4.9 % (ref 1.60–10.00)
LYMPH%: 8.5 % — ABNORMAL LOW (ref 14.0–49.7)
MCH: 29.3 pg (ref 25.1–34.0)
MCHC: 30.9 g/dL — ABNORMAL LOW (ref 31.5–36.0)
MCV: 94.8 fL (ref 79.5–101.0)
MONO#: 0.7 10*3/uL (ref 0.1–0.9)
MONO%: 10.3 % (ref 0.0–14.0)
NEUT%: 73.5 % (ref 38.4–76.8)
NEUTROS ABS: 5.3 10*3/uL (ref 1.5–6.5)
Platelets: 169 10*3/uL (ref 145–400)
RBC: 3.68 10*6/uL — ABNORMAL LOW (ref 3.70–5.45)
RDW: 14.2 % (ref 11.2–14.5)
RETIC %: 1.01 % (ref 0.70–2.10)
Retic Ct Abs: 37.17 10*3/uL (ref 33.70–90.70)
WBC: 7.2 10*3/uL (ref 3.9–10.3)
lymph#: 0.6 10*3/uL — ABNORMAL LOW (ref 0.9–3.3)

## 2017-06-20 LAB — IRON AND TIBC
%SAT: 21 % (ref 21–57)
IRON: 50 ug/dL (ref 41–142)
TIBC: 235 ug/dL — AB (ref 236–444)
UIBC: 185 ug/dL (ref 120–384)

## 2017-06-20 LAB — FERRITIN: FERRITIN: 263 ng/mL (ref 9–269)

## 2017-06-20 NOTE — Patient Instructions (Signed)
Thank you for choosing [facility] to provide your oncology and hematology care.  To afford each patient quality time with our providers, please arrive 30 minutes before your scheduled appointment time.  If you arrive late for your appointment, you may be asked to reschedule.  We strive to give you quality time with our providers, and arriving late affects you and other patients whose appointments are after yours.   If you are a no show for multiple scheduled visits, you may be dismissed from the clinic at the providers discretion.    Again, thank you for choosing [facility], our hope is that these requests will decrease the amount of time that you wait before being seen by our physicians.  ______________________________________________________________________  Should you have questions after your visit to the  Cancer Center, please contact our office at [phone] between the hours of 8:30 and 4:30 p.m.    Voicemails left after 4:30p.m will not be returned until the following business day.    For prescription refill requests, please have your pharmacy contact us directly.  Please also try to allow 48 hours for prescription requests.    Please contact the scheduling department for questions regarding scheduling.  For scheduling of procedures such as PET scans, CT scans, MRI, Ultrasound, etc please contact central scheduling at [phone].    Resources For Cancer Patients and Caregivers:   Oncolink.org:  A wonderful resource for patients and healthcare providers for information regarding your disease, ways to tract your treatment, what to expect, etc.     American Cancer Society:  800-227-2345  Can help patients locate various types of support and financial assistance  Cancer Care: 1-800-813-HOPE (4673) Provides financial assistance, online support groups, medication/co-pay assistance.    [place] DSS:  336-641-3447 Where to apply for food  stamps, Medicaid, and utility assistance  Medicare Rights Center: 800-333-4114 Helps people with Medicare understand their rights and benefits, navigate the Medicare system, and secure the quality healthcare they deserve  SCAT: 336-333-6589 [place] [employer]'s shared-ride transportation service for eligible riders who have a disability that prevents them from riding the fixed route bus.    For additional information on assistance programs please contact our social worker:   Grier Hock/Abigail Elmore:  [phone]            

## 2017-06-20 NOTE — Telephone Encounter (Signed)
Gave avs and calendar for February 2016

## 2017-06-21 ENCOUNTER — Telehealth: Payer: Self-pay | Admitting: *Deleted

## 2017-06-21 ENCOUNTER — Telehealth: Payer: Self-pay

## 2017-06-21 NOTE — Telephone Encounter (Signed)
-----   Message from Brunetta Genera, MD sent at 06/21/2017  9:23 AM EDT ----- Plz let Ms Fluornoy knwo her Iron levels are good and there is no immediate need for additional IV iron at this time. Will f/u as per plan in 4 months. Thanks [place]

## 2017-06-21 NOTE — Telephone Encounter (Signed)
Per staff message, sw patient to inform her of lab results.  No need for any intervention. Will f/u in 4 months as planned.  Pt verbalized understanding, thankful for call.

## 2017-06-21 NOTE — Telephone Encounter (Signed)
**Note De-Identified  Obfuscation** The pt called stating that she just received a call from [facility] stating that her [name] will cost her $400. We got a PA on the pts Entresto back in April. She also states that when she called to order her medication she was not told this.  I have advised her to call [facility] and ask what has changed as I have not heard from them and according to her chart her PA is good until 09/19/17.  I have left her 2 boxes of Entresto samples in our front office for her to pick up and advised her to call me if [facility] needs anything from [place]. She verbalized understanding and thanked me for my help.

## 2017-07-06 DIAGNOSIS — Z0100 Encounter for examination of eyes and vision without abnormal findings: Secondary | ICD-10-CM | POA: Diagnosis not present

## 2017-08-01 ENCOUNTER — Telehealth: Payer: Self-pay | Admitting: Family Medicine

## 2017-08-01 MED ORDER — AMLODIPINE BESYLATE 10 MG PO TABS: 10.0000 mg | ORAL_TABLET | Freq: Every day | ORAL | 0 refills | Status: DC

## 2017-08-01 NOTE — Telephone Encounter (Signed)
Pt needs refill Amlodipine 10mg  to local pharmacy because she is out [facility] to [place] Please

## 2017-08-30 ENCOUNTER — Other Ambulatory Visit (INDEPENDENT_AMBULATORY_CARE_PROVIDER_SITE_OTHER): Payer: Medicare HMO

## 2017-08-30 DIAGNOSIS — Z23 Encounter for immunization: Secondary | ICD-10-CM

## 2017-10-19 ENCOUNTER — Encounter: Payer: Self-pay | Admitting: Family Medicine

## 2017-10-19 ENCOUNTER — Ambulatory Visit (INDEPENDENT_AMBULATORY_CARE_PROVIDER_SITE_OTHER): Payer: Medicare HMO | Admitting: Family Medicine

## 2017-10-19 VITALS — BP 124/70 | HR 60 | Resp 18 | Wt 129.4 lb

## 2017-10-19 DIAGNOSIS — E785 Hyperlipidemia, unspecified: Secondary | ICD-10-CM | POA: Diagnosis not present

## 2017-10-19 DIAGNOSIS — E1169 Type 2 diabetes mellitus with other specified complication: Secondary | ICD-10-CM | POA: Diagnosis not present

## 2017-10-19 DIAGNOSIS — E118 Type 2 diabetes mellitus with unspecified complications: Secondary | ICD-10-CM

## 2017-10-19 DIAGNOSIS — E1159 Type 2 diabetes mellitus with other circulatory complications: Secondary | ICD-10-CM | POA: Diagnosis not present

## 2017-10-19 DIAGNOSIS — Z9849 Cataract extraction status, unspecified eye: Secondary | ICD-10-CM | POA: Diagnosis not present

## 2017-10-19 DIAGNOSIS — H3530 Unspecified macular degeneration: Secondary | ICD-10-CM | POA: Diagnosis not present

## 2017-10-19 DIAGNOSIS — D500 Iron deficiency anemia secondary to blood loss (chronic): Secondary | ICD-10-CM

## 2017-10-19 DIAGNOSIS — I10 Essential (primary) hypertension: Secondary | ICD-10-CM

## 2017-10-19 DIAGNOSIS — K31811 Angiodysplasia of stomach and duodenum with bleeding: Secondary | ICD-10-CM | POA: Diagnosis not present

## 2017-10-19 DIAGNOSIS — E039 Hypothyroidism, unspecified: Secondary | ICD-10-CM | POA: Diagnosis not present

## 2017-10-19 LAB — POCT UA - MICROALBUMIN
ALBUMIN/CREATININE RATIO, URINE, POC: 251
CREATININE, POC: 119.5 mg/dL
Microalbumin Ur, POC: >300 mg/L

## 2017-10-19 LAB — POCT GLYCOSYLATED HEMOGLOBIN (HGB A1C): Hemoglobin A1C: 5.8

## 2017-10-19 NOTE — Progress Notes (Signed)
Subjective:    Patient ID: Terrace Arabia, female    DOB: [birth year], 84 y.o.   MRN: [id number]  ALLEY NEILS is a 84 y.o. female who presents for follow-up of Type 2 diabetes mellitus.  Home blood sugar records: seldom Current symptoms/problems include none and have been unchanged. Daily foot checks:   Any foot concerns: no Exercise: The patient does not participate in regular exercise at present. Diet: Regular diet. She continues to be followed by cardiology and presently is on Entresto.  She is also taking amlodipine, Coreg.  Continues to do nicely on her Lipitor.  Presently she is also taking Actos.  She continues to supplement her diet with iron and is scheduled to see hematology in the near future.  She does have a history of angiodysplasia of the stomach.  Continues on Synthroid without difficulty.  She has had cataract surgery but now also has macular degeneration interfering with her sight. The following portions of the patient's history were reviewed and updated as appropriate: allergies, current medications, past medical history, past social history and problem list.  ROS as in subjective above.     Objective:    Physical Exam Alert and in no distress otherwise not examined.  Blood pressure 124/70, pulse 60, resp. rate 18, weight 129 lb 6.4 oz (58.7 kg).  Lab Review Diabetic Labs Latest Ref Rng & Units 06/20/2017 06/16/2017 05/10/2017 04/28/2017 04/21/2017  HbA1c - - 6.4 - - -  Microalbumin mg/L - - - - -  Micro/Creat Ratio - - - - - -  Chol <200 mg/dL - - - - -  HDL >50 mg/dL - - - - -  Calc LDL <100 mg/dL - - - - -  Triglycerides <150 mg/dL - - - - -  Creatinine 0.6 - 1.1 mg/dL 1.2(H) - 1.04(H) 1.10(H) 1.05(H)   BP/Weight 10/19/2017 06/20/2017 06/16/2017 7/86/7672 0/05/4708  Systolic BP 628 366 294 765 465  Diastolic BP 70 35 62 60 70  Wt. (Lbs) 129.4 125.6 125.2 126.6 124  BMI 27.04 26.25 26.17 26.46 25.92   Foot/eye exam completion dates Latest Ref Rng &  Units 05/27/2017 01/18/2017  Eye Exam No Retinopathy Retinopathy(A) Retinopathy(A)  Foot Form Completion - - -  A1c is 5.8  [place]  reports that  has never smoked. she has never used smokeless tobacco. She reports that she does not drink alcohol or use drugs.     Assessment & Plan:    Angiodysplasia of stomach and duodenum with hemorrhage - Plan: CBC with Differential/Platelet  Hypertension associated with diabetes ([place]) - Plan: CBC with Differential/Platelet, Comprehensive metabolic panel  Hypothyroidism, unspecified type - Plan: TSH  Iron deficiency anemia due to chronic blood loss - Plan: CBC with Differential/Platelet  Type 2 diabetes mellitus with complication, without long-term current use of insulin ([name]) - Plan: HgB A1c, POCT UA - Microalbumin, CBC with Differential/Platelet, Comprehensive metabolic panel, Lipid panel  Hyperlipidemia associated with type 2 diabetes mellitus ([place]) - Plan: Lipid panel  Macular degeneration (senile) of retina  History of cataract extraction, unspecified laterality   1. Rx changes: none 2. Education: Reviewed 'ABCs' of diabetes management (respective goals in parentheses):  A1C (<7), blood pressure (<130/80), and cholesterol (LDL <100). 3. Compliance at present is estimated to be fair. Efforts to improve compliance (if necessary) will be directed at increased exercise. 4. Follow up: 4 months She will also continue to be followed by cardiology as well as hematology and is scheduled to be seen there on February  16. She has had a very rough year but seems to have quieted down recently.

## 2017-10-20 LAB — CBC WITH DIFFERENTIAL/PLATELET
BASOS: 1 %
Basophils Absolute: 0 10*3/uL (ref 0.0–0.2)
EOS (ABSOLUTE): 0.2 10*3/uL (ref 0.0–0.4)
Eos: 4 %
HEMATOCRIT: 28.3 % — AB (ref 34.0–46.6)
Hemoglobin: 9.1 g/dL — ABNORMAL LOW (ref 11.1–15.9)
IMMATURE GRANULOCYTES: 0 %
Immature Grans (Abs): 0 10*3/uL (ref 0.0–0.1)
Lymphocytes Absolute: 0.5 10*3/uL — ABNORMAL LOW (ref 0.7–3.1)
Lymphs: 11 %
MCH: 29.1 pg (ref 26.6–33.0)
MCHC: 32.2 g/dL (ref 31.5–35.7)
MCV: 90 fL (ref 79–97)
MONOS ABS: 0.6 10*3/uL (ref 0.1–0.9)
Monocytes: 13 %
NEUTROS ABS: 3 10*3/uL (ref 1.4–7.0)
NEUTROS PCT: 71 %
Platelets: 260 10*3/uL (ref 150–379)
RBC: 3.13 x10E6/uL — ABNORMAL LOW (ref 3.77–5.28)
RDW: 14.6 % (ref 12.3–15.4)
WBC: 4.2 10*3/uL (ref 3.4–10.8)

## 2017-10-20 LAB — LIPID PANEL
CHOL/HDL RATIO: 3.2 ratio (ref 0.0–4.4)
Cholesterol, Total: 146 mg/dL (ref 100–199)
HDL: 46 mg/dL (ref >39)
LDL Calculated: 78 mg/dL (ref 0–99)
Triglycerides: 111 mg/dL (ref 0–149)
VLDL Cholesterol Cal: 22 mg/dL (ref 5–40)

## 2017-10-20 LAB — COMPREHENSIVE METABOLIC PANEL
A/G RATIO: 1.8 (ref 1.2–2.2)
ALT: 4 IU/L (ref 0–32)
AST: 17 IU/L (ref 0–40)
Albumin: 3.8 g/dL (ref 3.5–4.7)
Alkaline Phosphatase: 59 IU/L (ref 39–117)
BUN/Creatinine Ratio: 19 (ref 12–28)
BUN: 21 mg/dL (ref 8–27)
Bilirubin Total: 0.3 mg/dL (ref 0.0–1.2)
CALCIUM: 8.5 mg/dL — AB (ref 8.7–10.3)
CO2: 24 mmol/L (ref 20–29)
Chloride: 105 mmol/L (ref 96–106)
Creatinine, Ser: 1.09 mg/dL — ABNORMAL HIGH (ref 0.57–1.00)
GFR, EST AFRICAN AMERICAN: 55 mL/min/{1.73_m2} — AB (ref >59)
GFR, EST NON AFRICAN AMERICAN: 47 mL/min/{1.73_m2} — AB (ref >59)
GLOBULIN, TOTAL: 2.1 g/dL (ref 1.5–4.5)
Glucose: 114 mg/dL — ABNORMAL HIGH (ref 65–99)
POTASSIUM: 5 mmol/L (ref 3.5–5.2)
SODIUM: 142 mmol/L (ref 134–144)
TOTAL PROTEIN: 5.9 g/dL — AB (ref 6.0–8.5)

## 2017-10-20 LAB — TSH: TSH: 15.03 u[IU]/mL — ABNORMAL HIGH (ref 0.450–4.500)

## 2017-10-20 NOTE — Progress Notes (Signed)
Marland Kitchen    HEMATOLOGY/ONCOLOGY CLINIC NOTE  Date of Service: 10/21/17  Patient Care Team: [name], MD as PCP - General (Family Medicine)  CHIEF COMPLAINTS/PURPOSE OF CONSULTATION:  Iron deficiency anemia due to GI bleeding from multiple AVMs  DIAGNOSIS: Iron deficiency anemia likely related to ongoing chronic GI bleeding from multiple AVMs  TREATMENT -IV iron replacement with injectafer as needed (tolerated well). (Had some shortness of breath with IV Feraheme - which was likely fluid overload as opposed to a true allergic reaction but patient has been hesitant to take this)  HISTORY OF PRESENTING ILLNESS:  Please see previous note for details  INTERVAL HISTORY  Connie [name] is here for followup for her IDA. The patient's last visit with [place] was on 06/20/17. She is accompanied today by her son. The pt reports that she is doing well overall and enjoyed her holidays.  She reports not taking any blood thinners or aspiring, and has had no new medication changes. The pt reports taking an OTC iron pill once each day. She reports it is 7m ferrous sulfate and has been taking it for at least a year.   Lab results today (10/21/17) of Iron and TIBC CBC, CMP, and Reticulocytes is as follows: all values are WNL except for  Iron Saturation Ratio at 16, RBC at 3.23, Hgb at 9.4, HCT at 31.2, MCHC at 30.1, RDW at 14.8, Lymph Abs at 0.4, Creatinine at 1.28, BUN at 27 and her Ferritin is WNL but is down to 57.  On review of systems, pt reports adding wanted weight and denies blood in the stools, black stools, lightheadedness, CP, diziness, and any other symptoms.    MEDICAL HISTORY:  Past Medical History:  Diagnosis Date  . Allergy    RHINITIS  . Angiodysplasia of duodenum    hx/notes 11/21/2015  . Angiodysplasia of stomach    hx/notes 11/21/2015  . Arthritis    "joints ache"  . Bleeding gastric ulcer    hx/notes 11/21/2015  . Chronic blood loss anemia    secondary to angiodysplasia of the  stomach and duodenum as well as history of bleeding gastric ulcer hx/notes 11/21/2015  . [place]ive heart [place]  . History of blood transfusion 01/2015; 06/2015; 11/21/2015  . Hypercholesteremia   . Hypertension   . Hypothyroidism   . Obesity   . Pneumonia "several times"  . Type II diabetes mellitus ([place]     SURGICAL HISTORY: Past Surgical History:  Procedure Laterality Date  . CARDIAC CATHETERIZATION N/A 12/19/2015   Procedure: Left Heart Cath and Coronary Angiography;  Surgeon: DLeonie Man MD;  [facility];  Service: Cardiovascular;  Laterality: N/A;  . COLONOSCOPY N/A 08/30/2014   Procedure: COLONOSCOPY;  Surgeon: PBeryle Beams MD;  [facility]: [facility];  Service: Endoscopy;  Laterality: N/A;  . COLONOSCOPY N/A 02/21/2015   Procedure: COLONOSCOPY;  Surgeon: PCarol Ada MD;  [facility];  Service: Endoscopy;  Laterality: N/A;  . ENTEROSCOPY N/A 06/06/2015   Procedure: ENTEROSCOPY;  Surgeon: PCarol Ada MD;  Location: [facility];  Service: Endoscopy;  Laterality: N/A;  . FRACTURE SURGERY    . GIVENS CAPSULE STUDY N/A 05/15/2015   Procedure: GIVENS CAPSULE STUDY;  Surgeon: PCarol Ada MD;  [facility];  Service: Endoscopy;  Laterality: N/A;  . HOT HEMOSTASIS N/A 08/30/2014   Procedure: HOT HEMOSTASIS (ARGON PLASMA COAGULATION/BICAP);  Surgeon: PBeryle Beams MD;  [facility];  Service: Endoscopy;  Laterality: N/A;  .  HOT HEMOSTASIS N/A 06/06/2015   Procedure: HOT HEMOSTASIS (ARGON PLASMA COAGULATION/BICAP);  Surgeon: Carol Ada, MD;  [facility];  Service: Endoscopy;  Laterality: N/A;  . JOINT REPLACEMENT    . REVISION TOTAL KNEE ARTHROPLASTY Bilateral 2004-2015  . SHOULDER OPEN ROTATOR CUFF REPAIR Right 12/2004   open subacromial decompression, distal clavicle  resection, rotator cuff repair/notes 02/02/2011  . TONSILLECTOMY  1940s  . TOTAL KNEE ARTHROPLASTY Bilateral ~  2002  . TOTAL THYROIDECTOMY  ~ 1966    SOCIAL HISTORY: Social History   Socioeconomic History  . Marital status: Married    Spouse name: Not on file  . Number of children: Not on file  . Years of education: Not on file  . Highest education level: Not on file  Social Needs  . Financial resource strain: Not on file  . Food insecurity - worry: Not on file  . Food insecurity - inability: Not on file  . Transportation needs - medical: Not on file  . Transportation needs - non-medical: Not on file  Occupational History  . Not on file  Tobacco Use  . Smoking status: Never Smoker  . Smokeless tobacco: Never Used  Substance and Sexual Activity  . Alcohol use: No  . Drug use: No  . Sexual activity: Not Currently  Other Topics Concern  . Not on file  Social History Narrative  . Not on file    FAMILY HISTORY: Family History  Problem Relation Age of Onset  . Asthma Mother   . Ulcers Father   . Anemia Sister     ALLERGIES:  has No Known Allergies.  MEDICATIONS:  Current Outpatient Medications  Medication Sig Dispense Refill  . ACCU-CHEK FASTCLIX LANCETS MISC 1 each by Does not apply route 2 (two) times daily. DX E11.9 204 each 3  . amLODipine (NORVASC) 10 MG tablet Take 1 tablet (10 mg total) daily by mouth. 90 tablet 0  . atorvastatin (LIPITOR) 40 MG tablet TAKE ONE TABLET BY MOUTH ONCE DAILY 90 tablet 3  . Blood Glucose Monitoring Suppl (ACCU-CHEK GUIDE) w/Device KIT 1 each by Does not apply route 2 (two) times daily. DX E11.9 1 kit 0  . carvedilol (COREG) 12.5 MG tablet Take 1 tablet (12.5 mg total) by mouth 2 (two) times daily with a meal. 180 tablet 3  . ENTRESTO 49-51 MG TAKE 1 TABLET TWICE DAILY 180 tablet 3  . ferrous sulfate 325 (65 FE) MG tablet Take 1 tablet (325 mg total) by mouth 3 (three) times daily with meals. 90 tablet 0  . furosemide (LASIX) 40 MG tablet Take 1 tablet (40 mg total) by mouth daily. (Patient taking differently: Take 40 mg by mouth 2 (two) times  daily. ) 90 tablet 3  . glucose blood (ACCU-CHEK GUIDE) test strip Patient to test 2 times daily DX E11.9 Use as instructed 200 each 3  . Lancets Misc. (ACCU-CHEK FASTCLIX LANCET) KIT 1 each by Does not apply route 2 (two) times daily. DX E11.9 1 kit 0  . levothyroxine (SYNTHROID, LEVOTHROID) 125 MCG tablet Take 125 mcg by mouth daily before breakfast.    . Multiple Vitamins-Minerals (WOMENS MULTIVITAMIN PLUS PO) Take 1 tablet by mouth daily.    . pantoprazole (PROTONIX) 20 MG tablet Take 1 tablet (20 mg total) by mouth daily. 90 tablet 3  . pioglitazone (ACTOS) 30 MG tablet Take 1 tablet (30 mg total) by mouth daily. 90 tablet 1  . potassium chloride (K-DUR) 10 MEQ tablet Take 1 tablet (10  mEq total) by mouth as directed. Take 10 meq twice a day every Mon, Wed and Fri's    . potassium chloride (K-DUR,KLOR-CON) 10 MEQ tablet TAKE 1 TABLET TWICE DAILY 180 tablet 1   No current facility-administered medications for this visit.     REVIEW OF SYSTEMS:    10 Point review of Systems was done is negative except as noted above.  PHYSICAL EXAMINATION:  ECOG PERFORMANCE STATUS: 1 - Symptomatic but completely ambulatory  . Vitals:   10/21/17 1014  BP: (!) 157/56  Pulse: 62  Resp: 18  Temp: 98.1 F (36.7 C)  SpO2: 100%   Filed Weights   10/21/17 1014  Weight: 127 lb 12.8 oz (58 kg)   .Body mass index is 26.71 kg/m.  GENERAL:alert, in no acute distress and comfortable SKIN: skin color, texture, turgor are normal, no rashes or significant lesions. Redness noted to right shin from probable contact dermatitis.  EYES: normal, conjunctiva are pink and non-injected, sclera clear OROPHARYNX:no exudate, no erythema and lips, buccal mucosa, and tongue normal  NECK: supple, no JVD, thyroid normal size, non-tender, without nodularity LYMPH:  no palpable lymphadenopathy in the cervical, axillary or inguinal LUNGS: clear to auscultation with normal respiratory effort HEART: regular rate & rhythm,   no murmurs and 1+ lower extremity edema ABDOMEN: abdomen soft, non-tender, normoactive bowel sounds ,  no palpable hepatosplenomegaly Musculoskeletal: no cyanosis of digits and no clubbing  PSYCH: alert & oriented x 3 with fluent speech NEURO: no focal motor/sensory deficits  LABORATORY DATA:    CBC Latest Ref Rng & Units 10/21/2017 10/19/2017 06/20/2017  WBC 3.9 - 10.3 K/uL 4.2 4.2 7.2  Hemoglobin 11.1 - 15.9 g/dL - 9.1(L) 10.8(L)  Hematocrit 34.8 - 46.6 % 31.2(L) 28.3(L) 34.9  Platelets 145 - 400 K/uL 236 260 169    . CBC    Component Value Date/Time   WBC 4.2 10/21/2017 0934   WBC 7.2 06/20/2017 1326   WBC 6.9 06/16/2017 1041   RBC 3.23 (L) 10/21/2017 0934   RBC 3.23 (L) 10/21/2017 0934   HGB 9.1 (L) 10/19/2017 1149   HGB 10.8 (L) 06/20/2017 1326   HCT 31.2 (L) 10/21/2017 0934   HCT 28.3 (L) 10/19/2017 1149   HCT 34.9 06/20/2017 1326   PLT 236 10/21/2017 0934   PLT 260 10/19/2017 1149   MCV 96.6 10/21/2017 0934   MCV 90 10/19/2017 1149   MCV 94.8 06/20/2017 1326   MCH 29.1 10/21/2017 0934   MCHC 30.1 (L) 10/21/2017 0934   RDW 14.8 (H) 10/21/2017 0934   RDW 14.6 10/19/2017 1149   RDW 14.2 06/20/2017 1326   LYMPHSABS 0.4 (L) 10/21/2017 0934   LYMPHSABS 0.5 (L) 10/19/2017 1149   LYMPHSABS 0.6 (L) 06/20/2017 1326   MONOABS 0.6 10/21/2017 0934   MONOABS 0.7 06/20/2017 1326   EOSABS 0.2 10/21/2017 0934   EOSABS 0.2 10/19/2017 1149   BASOSABS 0.0 10/21/2017 0934   BASOSABS 0.0 10/19/2017 1149   BASOSABS 0.0 06/20/2017 1326    . CMP Latest Ref Rng & Units 10/21/2017 10/19/2017 06/20/2017  Glucose 70 - 140 mg/dL 127 114(H) 150(H)  BUN 7 - 26 mg/dL 27(H) 21 38.3(H)  Creatinine 0.60 - 1.10 mg/dL 1.28(H) 1.09(H) 1.2(H)  Sodium 136 - 145 mmol/L 142 142 141  Potassium 3.5 - 5.1 mmol/L 5.1 5.0 3.7  Chloride 98 - 109 mmol/L 107 105 -  CO2 22 - 29 mmol/L '27 24 24  ' Calcium 8.4 - 10.4 mg/dL 8.7 8.5(L) 9.0  Total Protein  6.4 - 8.3 g/dL 6.4 5.9(L) 7.0  Total Bilirubin 0.2 - 1.2  mg/dL 0.5 0.3 0.60  Alkaline Phos 40 - 150 U/L 63 59 75  AST 5 - 34 U/L '15 17 16  ' ALT 0 - 55 U/L <6 4 <6        ASSESSMENT & PLAN:   84 year old Caucasian female with  #1 Microcytic anemia likely related to iron deficiency from chronic GI bleeding. No known disorder causing chronic inflammation at this time.  #2 history of recurrent GI bleeding related to upper and lower GI tract AVMs and gastric erosions. Has had several admissions with requirement of multiple PRBC transfusions. Dropping ferritin suggests ongoing slow GI losses. Hemoglobin down from 10.8 to 9.4. No clinically overt GI bleeding noted.  #3 Hypothyroidism on levothyroxine .  #4 ?? Reaction to [facility] - current hospital admission that shortness of breath appears to be more likely related to CHF decompensation than allergic reaction to Feraheme. However patient is very anxious and hesitant to get this preparation. PLAN -Discussed pt labwork today, ferritin has dropped to 57 and Hgb to 9.4. -Discussed that we could give her a prescription for PO iron, or occasional IV iron. She noted that she desires IV iron, and will continue to take her 20m ferrosulfate. -Her last iron transfusion was in March of 2018   -will treat to maintain ferritin >100 since she appears to be having ongoing GI losses -we will set her up for 2 infusions of IV Injectafer 755mone week apart.  -Continue daily multivitamin orally. -continue ferrous sulfate 1 tab po daily for maintenance ( patient tolerating this without any GI issues) -Continue close followup with primary care physician for management of other medical issues. (HTN control)  IV Injectafer weekly x 2 doses within 1-2 weeks RTC with Dr KaIrene Limbon 3 months with labs  All of the patients questions were answered with apparent satisfaction. The patient knows to call the clinic with any problems, questions or concerns.  I spent 20 minutes counseling the patient face to face. The total  time spent in the appointment was 20 minutes and more than 50% was on counseling and direct patient cares.    GaSullivan LoneD MSDodgeAHIVMS SCBaylor Emergency Medical CenterTScl Health Community Hospital - Southwestematology/Oncology Physician CoSurf City(Office):       [phone] cell):  [phone]):           [phone] document serves as a record of services personally performed by GaSullivan LoneMD. It was created on his behalf by ScBaldwin Jamaicaa trained medical scribe. The creation of this record is based on the scribe's personal observations and the provider's statements to them.   .I have reviewed the above documentation for accuracy and completeness, and I agree with the above. .GBrunetta GeneraD Connie

## 2017-10-21 ENCOUNTER — Telehealth: Payer: Self-pay | Admitting: Hematology

## 2017-10-21 ENCOUNTER — Encounter: Payer: Self-pay | Admitting: Hematology

## 2017-10-21 ENCOUNTER — Inpatient Hospital Stay: Payer: Medicare HMO

## 2017-10-21 ENCOUNTER — Inpatient Hospital Stay: Payer: Medicare HMO | Attending: Hematology | Admitting: Hematology

## 2017-10-21 VITALS — BP 157/56 | HR 62 | Temp 98.1°F | Resp 18 | Ht <= 58 in | Wt 127.8 lb

## 2017-10-21 DIAGNOSIS — D500 Iron deficiency anemia secondary to blood loss (chronic): Secondary | ICD-10-CM | POA: Diagnosis not present

## 2017-10-21 DIAGNOSIS — K922 Gastrointestinal hemorrhage, unspecified: Secondary | ICD-10-CM | POA: Insufficient documentation

## 2017-10-21 DIAGNOSIS — E039 Hypothyroidism, unspecified: Secondary | ICD-10-CM | POA: Diagnosis not present

## 2017-10-21 LAB — COMPREHENSIVE METABOLIC PANEL
ALBUMIN: 3.6 g/dL (ref 3.5–5.0)
ALK PHOS: 63 U/L (ref 40–150)
ALT: <6 U/L (ref 0–55)
AST: 15 U/L (ref 5–34)
Anion gap: 8 (ref 3–11)
BILIRUBIN TOTAL: 0.5 mg/dL (ref 0.2–1.2)
BUN: 27 mg/dL — AB (ref 7–26)
CO2: 27 mmol/L (ref 22–29)
Calcium: 8.7 mg/dL (ref 8.4–10.4)
Chloride: 107 mmol/L (ref 98–109)
Creatinine, Ser: 1.28 mg/dL — ABNORMAL HIGH (ref 0.60–1.10)
GFR calc Af Amer: 44 mL/min — ABNORMAL LOW (ref >60)
GFR, EST NON AFRICAN AMERICAN: 38 mL/min — AB (ref >60)
GLUCOSE: 127 mg/dL (ref 70–140)
Potassium: 5.1 mmol/L (ref 3.5–5.1)
Sodium: 142 mmol/L (ref 136–145)
TOTAL PROTEIN: 6.4 g/dL (ref 6.4–8.3)

## 2017-10-21 LAB — CBC WITH DIFFERENTIAL (CANCER CENTER ONLY)
BASOS ABS: 0 10*3/uL (ref 0.0–0.1)
Basophils Relative: 1 %
Eosinophils Absolute: 0.2 10*3/uL (ref 0.0–0.5)
Eosinophils Relative: 4 %
HCT: 31.2 % — ABNORMAL LOW (ref 34.8–46.6)
Hemoglobin: 9.4 g/dL — ABNORMAL LOW (ref 11.6–15.9)
LYMPHS PCT: 10 %
Lymphs Abs: 0.4 10*3/uL — ABNORMAL LOW (ref 0.9–3.3)
MCH: 29.1 pg (ref 25.1–34.0)
MCHC: 30.1 g/dL — ABNORMAL LOW (ref 31.5–36.0)
MCV: 96.6 fL (ref 79.5–101.0)
MONO ABS: 0.6 10*3/uL (ref 0.1–0.9)
Monocytes Relative: 13 %
Neutro Abs: 3.1 10*3/uL (ref 1.5–6.5)
Neutrophils Relative %: 72 %
PLATELETS: 236 10*3/uL (ref 145–400)
RBC: 3.23 MIL/uL — AB (ref 3.70–5.45)
RDW: 14.8 % — AB (ref 11.2–14.5)
WBC: 4.2 10*3/uL (ref 3.9–10.3)

## 2017-10-21 LAB — IRON AND TIBC
IRON: 46 ug/dL (ref 41–142)
Saturation Ratios: 16 % — ABNORMAL LOW (ref 21–57)
TIBC: 280 ug/dL (ref 236–444)
UIBC: 234 ug/dL

## 2017-10-21 LAB — RETICULOCYTES
RBC.: 3.23 MIL/uL — ABNORMAL LOW (ref 3.70–5.45)
RETIC COUNT ABSOLUTE: 42 10*3/uL (ref 33.7–90.7)
Retic Ct Pct: 1.3 % (ref 0.7–2.1)

## 2017-10-21 LAB — FERRITIN: Ferritin: 57 ng/mL (ref 9–269)

## 2017-10-21 NOTE — Telephone Encounter (Signed)
Gave avs and calendar for February and may. Patient could not come on wed,thur or fri

## 2017-10-28 ENCOUNTER — Other Ambulatory Visit: Payer: Self-pay | Admitting: Family Medicine

## 2017-10-29 ENCOUNTER — Other Ambulatory Visit: Payer: Self-pay | Admitting: Family Medicine

## 2017-10-29 DIAGNOSIS — E785 Hyperlipidemia, unspecified: Principal | ICD-10-CM

## 2017-10-29 DIAGNOSIS — E1169 Type 2 diabetes mellitus with other specified complication: Secondary | ICD-10-CM

## 2017-11-01 ENCOUNTER — Inpatient Hospital Stay: Payer: Medicare HMO

## 2017-11-01 VITALS — BP 137/46 | HR 55 | Temp 98.5°F | Resp 18

## 2017-11-01 DIAGNOSIS — D500 Iron deficiency anemia secondary to blood loss (chronic): Secondary | ICD-10-CM | POA: Diagnosis not present

## 2017-11-01 DIAGNOSIS — K922 Gastrointestinal hemorrhage, unspecified: Secondary | ICD-10-CM | POA: Diagnosis not present

## 2017-11-01 DIAGNOSIS — E039 Hypothyroidism, unspecified: Secondary | ICD-10-CM | POA: Diagnosis not present

## 2017-11-01 MED ORDER — ACETAMINOPHEN 325 MG PO TABS
650.0000 mg | ORAL_TABLET | Freq: Once | ORAL | Status: AC
  Administered 2017-11-01: 650 mg via ORAL

## 2017-11-01 MED ORDER — FUROSEMIDE 10 MG/ML IJ SOLN
INTRAMUSCULAR | Status: AC
  Filled 2017-11-01: qty 2

## 2017-11-01 MED ORDER — SODIUM CHLORIDE 0.9 % IV SOLN
750.0000 mg | Freq: Once | INTRAVENOUS | Status: AC
  Administered 2017-11-01: 750 mg via INTRAVENOUS
  Filled 2017-11-01: qty 15

## 2017-11-01 MED ORDER — FUROSEMIDE 10 MG/ML IJ SOLN
20.0000 mg | Freq: Once | INTRAMUSCULAR | Status: AC
  Administered 2017-11-01: 20 mg via INTRAVENOUS

## 2017-11-01 MED ORDER — ACETAMINOPHEN 325 MG PO TABS
ORAL_TABLET | ORAL | Status: AC
  Filled 2017-11-01: qty 2

## 2017-11-01 NOTE — Patient Instructions (Signed)
Ferric carboxymaltose injection What is this medicine? FERRIC CARBOXYMALTOSE (ferr-ik car-box-ee-mol-toes) is an iron complex. Iron is used to make healthy red blood cells, which carry oxygen and nutrients throughout the body. This medicine is used to treat anemia in people with chronic kidney disease or people who cannot take iron by mouth. This medicine may be used for other purposes; ask your health care provider or pharmacist if you have questions. COMMON BRAND NAME(S): Injectafer What should I tell my health care provider before I take this medicine? They need to know if you have any of these conditions: -anemia not caused by low iron levels -high levels of iron in the blood -liver disease -an unusual or allergic reaction to iron, other medicines, foods, dyes, or preservatives -pregnant or trying to get pregnant -breast-feeding How should I use this medicine? This medicine is for infusion into a vein. It is given by a health care professional in a hospital or clinic setting. Talk to your pediatrician regarding the use of this medicine in children. Special care may be needed. Overdosage: If you think you have taken too much of this medicine contact a poison control center or emergency room at once. NOTE: This medicine is only for you. Do not share this medicine with others. What if I miss a dose? It is important not to miss your dose. Call your doctor or health care professional if you are unable to keep an appointment. What may interact with this medicine? Do not take this medicine with any of the following medications: -deferoxamine -dimercaprol -other iron products This medicine may also interact with the following medications: -chloramphenicol -deferasirox This list may not describe all possible interactions. Give your health care provider a list of all the medicines, herbs, non-prescription drugs, or dietary supplements you use. Also tell them if you smoke, drink alcohol, or use  illegal drugs. Some items may interact with your medicine. What should I watch for while using this medicine? Visit your doctor or health care professional regularly. Tell your doctor if your symptoms do not start to get better or if they get worse. You may need blood work done while you are taking this medicine. You may need to follow a special diet. Talk to your doctor. Foods that contain iron include: whole grains/cereals, dried fruits, beans, or peas, leafy green vegetables, and organ meats (liver, kidney). What side effects may I notice from receiving this medicine? Side effects that you should report to your doctor or health care professional as soon as possible: -allergic reactions like skin rash, itching or hives, swelling of the face, lips, or tongue -breathing problems -changes in blood pressure -feeling faint or lightheaded, falls -flushing, sweating, or hot feelings Side effects that usually do not require medical attention (report to your doctor or health care professional if they continue or are bothersome): -changes in taste -constipation -dizziness -headache -nausea -pain, redness, or irritation at site where injected -vomiting This list may not describe all possible side effects. Call your doctor for medical advice about side effects. You may report side effects to FDA at 1-800-FDA-1088. Where should I keep my medicine? This drug is given in a hospital or clinic and will not be stored at home. NOTE: This sheet is a summary. It may not cover all possible information. If you have questions about this medicine, talk to your doctor, pharmacist, or health care provider.  2018 Elsevier/Gold Standard (2015-10-09 11:20:47)  

## 2017-11-02 DIAGNOSIS — D500 Iron deficiency anemia secondary to blood loss (chronic): Secondary | ICD-10-CM | POA: Diagnosis not present

## 2017-11-08 ENCOUNTER — Inpatient Hospital Stay: Payer: Medicare HMO

## 2017-11-08 VITALS — BP 155/45 | HR 61 | Temp 98.0°F | Resp 18

## 2017-11-08 DIAGNOSIS — D500 Iron deficiency anemia secondary to blood loss (chronic): Secondary | ICD-10-CM | POA: Diagnosis not present

## 2017-11-08 DIAGNOSIS — E039 Hypothyroidism, unspecified: Secondary | ICD-10-CM | POA: Diagnosis not present

## 2017-11-08 DIAGNOSIS — K922 Gastrointestinal hemorrhage, unspecified: Secondary | ICD-10-CM | POA: Diagnosis not present

## 2017-11-08 MED ORDER — ACETAMINOPHEN 325 MG PO TABS
650.0000 mg | ORAL_TABLET | Freq: Once | ORAL | Status: AC
  Administered 2017-11-08: 650 mg via ORAL

## 2017-11-08 MED ORDER — ACETAMINOPHEN 325 MG PO TABS
ORAL_TABLET | ORAL | Status: AC
  Filled 2017-11-08: qty 2

## 2017-11-08 MED ORDER — FUROSEMIDE 10 MG/ML IJ SOLN
20.0000 mg | Freq: Once | INTRAMUSCULAR | Status: AC
  Administered 2017-11-08: 20 mg via INTRAVENOUS

## 2017-11-08 MED ORDER — FUROSEMIDE 10 MG/ML IJ SOLN
INTRAMUSCULAR | Status: AC
Start: 2017-11-08 — End: 2017-11-08
  Filled 2017-11-08: qty 2

## 2017-11-08 MED ORDER — SODIUM CHLORIDE 0.9 % IV SOLN
750.0000 mg | Freq: Once | INTRAVENOUS | Status: AC
  Administered 2017-11-08: 750 mg via INTRAVENOUS
  Filled 2017-11-08: qty 15

## 2017-11-08 NOTE — Patient Instructions (Signed)
Ferric carboxymaltose injection What is this medicine? FERRIC CARBOXYMALTOSE (ferr-ik car-box-ee-mol-toes) is an iron complex. Iron is used to make healthy red blood cells, which carry oxygen and nutrients throughout the body. This medicine is used to treat anemia in people with chronic kidney disease or people who cannot take iron by mouth. This medicine may be used for other purposes; ask your health care provider or pharmacist if you have questions. COMMON BRAND NAME(S): Injectafer What should I tell my health care provider before I take this medicine? They need to know if you have any of these conditions: -anemia not caused by low iron levels -high levels of iron in the blood -liver disease -an unusual or allergic reaction to iron, other medicines, foods, dyes, or preservatives -pregnant or trying to get pregnant -breast-feeding How should I use this medicine? This medicine is for infusion into a vein. It is given by a health care professional in a hospital or clinic setting. Talk to your pediatrician regarding the use of this medicine in children. Special care may be needed. Overdosage: If you think you have taken too much of this medicine contact a poison control center or emergency room at once. NOTE: This medicine is only for you. Do not share this medicine with others. What if I miss a dose? It is important not to miss your dose. Call your doctor or health care professional if you are unable to keep an appointment. What may interact with this medicine? Do not take this medicine with any of the following medications: -deferoxamine -dimercaprol -other iron products This medicine may also interact with the following medications: -chloramphenicol -deferasirox This list may not describe all possible interactions. Give your health care provider a list of all the medicines, herbs, non-prescription drugs, or dietary supplements you use. Also tell them if you smoke, drink alcohol, or use  illegal drugs. Some items may interact with your medicine. What should I watch for while using this medicine? Visit your doctor or health care professional regularly. Tell your doctor if your symptoms do not start to get better or if they get worse. You may need blood work done while you are taking this medicine. You may need to follow a special diet. Talk to your doctor. Foods that contain iron include: whole grains/cereals, dried fruits, beans, or peas, leafy green vegetables, and organ meats (liver, kidney). What side effects may I notice from receiving this medicine? Side effects that you should report to your doctor or health care professional as soon as possible: -allergic reactions like skin rash, itching or hives, swelling of the face, lips, or tongue -breathing problems -changes in blood pressure -feeling faint or lightheaded, falls -flushing, sweating, or hot feelings Side effects that usually do not require medical attention (report to your doctor or health care professional if they continue or are bothersome): -changes in taste -constipation -dizziness -headache -nausea -pain, redness, or irritation at site where injected -vomiting This list may not describe all possible side effects. Call your doctor for medical advice about side effects. You may report side effects to FDA at 1-800-FDA-1088. Where should I keep my medicine? This drug is given in a hospital or clinic and will not be stored at home. NOTE: This sheet is a summary. It may not cover all possible information. If you have questions about this medicine, talk to your doctor, pharmacist, or health care provider.  2018 Elsevier/Gold Standard (2015-10-09 11:20:47)  

## 2017-12-07 ENCOUNTER — Telehealth: Payer: Self-pay | Admitting: Family Medicine

## 2017-12-07 NOTE — Telephone Encounter (Signed)
New Message   *STAT* If patient is at the pharmacy, call can be transferred to refill team.   1. Which medications need to be refilled? (please list name of each medication and dose if known)  Levothyroxine 125 mcg tablet once daily before breakfast  2. Which pharmacy/location (including street and city if local pharmacy) is medication to be sent to? [facility]  3. Do they need a 30 day or 90 day supply?  90 days supply

## 2017-12-08 ENCOUNTER — Other Ambulatory Visit: Payer: Self-pay

## 2017-12-08 MED ORDER — LEVOTHYROXINE SODIUM 125 MCG PO TABS: 125.0000 ug | ORAL_TABLET | Freq: Every day | ORAL | 1 refills | Status: DC

## 2017-12-11 ENCOUNTER — Other Ambulatory Visit: Payer: Self-pay | Admitting: Cardiology

## 2018-01-16 NOTE — Progress Notes (Signed)
Marland Kitchen    HEMATOLOGY/ONCOLOGY CLINIC NOTE  Date of Service: 01/18/18  Patient Care Team: [name], MD as PCP - General (Family Medicine)  CHIEF COMPLAINTS/PURPOSE OF CONSULTATION:  Iron deficiency anemia due to GI bleeding from multiple AVMs  DIAGNOSIS: Iron deficiency anemia likely related to ongoing chronic GI bleeding from multiple AVMs  TREATMENT -IV iron replacement with injectafer as needed (tolerated well). (Had some shortness of breath with IV Feraheme - which was likely fluid overload as opposed to a true allergic reaction but patient has been hesitant to take this)  HISTORY OF PRESENTING ILLNESS:  Please see previous note for details  INTERVAL HISTORY  Ms Smigel is here for followup for her IDA. The patient's last visit with [place] was on 10/21/17. She is accompanied today by her son. The pt reports that she is doing well overall.   The pt reports that her bowel movements have been steady, she continues to take a general multi-vitamin but denies taking a B complex vitamin. She denies any surgeries or major medication changes since we last met. She continues to take Protonix. She notes that she takes her Levothyroxine with all of her other medications.   Received IV Injectafer on 11/01/17 and 11/08/17 and continues to take PO Ferrous sulfate daily.   Lab results today (01/18/18) of CBC, and Reticulocytes is as follows: all values are WNL except for RBC at 2.90, Hgb at 8.8, HCT at 28.7, MCHC at 30.7, RDW at 15.8, Lymphs Abs at 0.6k. CMP 01/18/18 is pending.  Ferritin  244.   On review of systems, pt reports steady weight, and denies black stools, blood in the stools, nose bleeds, gum bleeds, light headedness, dizziness, abdominal pains, changes in bowel habits, mouth sores, problems passing urine, and any other symptoms.   MEDICAL HISTORY:  Past Medical History:  Diagnosis Date  . Allergy    RHINITIS  . Angiodysplasia of duodenum    hx/notes 11/21/2015  . Angiodysplasia of  stomach    hx/notes 11/21/2015  . Arthritis    "joints ache"  . Bleeding gastric ulcer    hx/notes 11/21/2015  . Chronic blood loss anemia    secondary to angiodysplasia of the stomach and duodenum as well as history of bleeding gastric ulcer hx/notes 11/21/2015  . Chronic systolic CHF (congestive heart failure) ([place]) 12/23/2015  . History of blood transfusion 01/2015; 06/2015; 11/21/2015  . Hypercholesteremia   . Hypertension   . Hypothyroidism   . Obesity   . Pneumonia "several times"  . Type II diabetes mellitus ([place])     SURGICAL HISTORY: Past Surgical History:  Procedure Laterality Date  . CARDIAC CATHETERIZATION N/A 12/19/2015   Procedure: Left Heart Cath and Coronary Angiography;  Surgeon: Leonie Man, MD;  Location: [facility];  Service: Cardiovascular;  Laterality: N/A;  . COLONOSCOPY N/A 08/30/2014   Procedure: COLONOSCOPY;  Surgeon: Beryle Beams, MD;  Location: [facility];  Service: Endoscopy;  Laterality: N/A;  . COLONOSCOPY N/A 02/21/2015   Procedure: COLONOSCOPY;  Surgeon: Carol Ada, MD;  Location: [facility] END[facility]COPY;  Service: Endoscopy;  Laterality: N/A;  . ENTEROSCOPY N/A 06/06/2015   Procedure: ENTEROSCOPY;  Surgeon: Carol Ada, MD;  Location: [facility];  Service: Endoscopy;  Laterality: N/A;  . FRACTURE SURGERY    . GIVENS CAPSULE STUDY N/A 05/15/2015   Procedure: GIVENS CAPSULE STUDY;  Surgeon: Carol Ada, MD;  Location: [facility] ENDOSCOPY;  Service: Endoscopy;  Laterality: N/A;  . HOT HEMOSTASIS N/A 08/30/2014   Procedure: HOT  HEMOSTASIS (ARGON PLASMA COAGULATION/BICAP);  Surgeon: Beryle Beams, MD;  Location: [facility];  Service: Endoscopy;  Laterality: N/A;  . HOT HEMOSTASIS N/A 06/06/2015   Procedure: HOT HEMOSTASIS (ARGON PLASMA COAGULATION/BICAP);  Surgeon: Carol Ada, MD;  Location: [facility];  Service: Endoscopy;  Laterality: N/A;  . JOINT REPLACEMENT    . REVISION TOTAL KNEE ARTHROPLASTY Bilateral 2004-2015  . SHOULDER OPEN ROTATOR CUFF REPAIR  Right 12/2004   open subacromial decompression, distal clavicle  resection, rotator cuff repair/notes 02/02/2011  . TONSILLECTOMY  1940s  . TOTAL KNEE ARTHROPLASTY Bilateral ~ 2002  . TOTAL THYROIDECTOMY  ~ 1966    SOCIAL HISTORY: Social History   Socioeconomic History  . Marital status: Married    Spouse name: Not on file  . Number of children: Not on file  . Years of education: Not on file  . Highest education level: Not on file  Occupational History  . Not on file  Social Needs  . Financial resource strain: Not on file  . Food insecurity:    Worry: Not on file    Inability: Not on file  . Transportation needs:    Medical: Not on file    Non-medical: Not on file  Tobacco Use  . Smoking status: Never Smoker  . Smokeless tobacco: Never Used  Substance and Sexual Activity  . Alcohol use: No  . Drug use: No  . Sexual activity: Not Currently  Lifestyle  . Physical activity:    Days per week: Not on file    Minutes per session: Not on file  . Stress: Not on file  Relationships  . Social connections:    Talks on phone: Not on file    Gets together: Not on file    Attends religious service: Not on file    Active member of club or organization: Not on file    Attends meetings of clubs or organizations: Not on file    Relationship status: Not on file  . Intimate partner violence:    Fear of current or ex partner: Not on file    Emotionally abused: Not on file    Physically abused: Not on file    Forced sexual activity: Not on file  Other Topics Concern  . Not on file  Social History Narrative  . Not on file    FAMILY HISTORY: Family History  Problem Relation Age of Onset  . Asthma Mother   . Ulcers Father   . Anemia Sister     ALLERGIES:  has No Known Allergies.  MEDICATIONS:  Current Outpatient Medications  Medication Sig Dispense Refill  . ACCU-CHEK FASTCLIX LANCETS MISC 1 each by Does not apply route 2 (two) times daily. DX E11.9 204 each 3  .  amLODipine (NORVASC) 10 MG tablet TAKE 1 TABLET BY MOUTH DAILY 90 tablet 0  . atorvastatin (LIPITOR) 40 MG tablet TAKE 1 TABLET EVERY DAY 90 tablet 3  . Blood Glucose Monitoring Suppl (ACCU-CHEK GUIDE) w/Device KIT 1 each by Does not apply route 2 (two) times daily. DX E11.9 1 kit 0  . carvedilol (COREG) 12.5 MG tablet TAKE 1 TABLET (12.5 MG TOTAL) BY MOUTH 2 (TWO) TIMES DAILY WITH A MEAL. 180 tablet 1  . ENTRESTO 49-51 MG TAKE 1 TABLET TWICE DAILY 180 tablet 3  . ferrous sulfate 325 (65 FE) MG tablet Take 1 tablet (325 mg total) by mouth 3 (three) times daily with meals. 90 tablet 0  . glucose blood (ACCU-CHEK GUIDE) test strip Patient  to test 2 times daily DX E11.9 Use as instructed 200 each 3  . Lancets Misc. (ACCU-CHEK FASTCLIX LANCET) KIT 1 each by Does not apply route 2 (two) times daily. DX E11.9 1 kit 0  . levothyroxine (SYNTHROID, LEVOTHROID) 125 MCG tablet Take 1 tablet (125 mcg total) by mouth daily before breakfast. 90 tablet 1  . Multiple Vitamins-Minerals (WOMENS MULTIVITAMIN PLUS PO) Take 1 tablet by mouth daily.    . pantoprazole (PROTONIX) 20 MG tablet Take 1 tablet (20 mg total) by mouth daily. 90 tablet 3  . pioglitazone (ACTOS) 30 MG tablet TAKE 1 TABLET EVERY DAY 90 tablet 1  . potassium chloride (K-DUR,KLOR-CON) 10 MEQ tablet TAKE 1 TABLET TWICE DAILY 180 tablet 1  . furosemide (LASIX) 40 MG tablet Take 1 tablet (40 mg total) by mouth daily. (Patient taking differently: Take 40 mg by mouth 2 (two) times daily. ) 90 tablet 3  . potassium chloride (K-DUR) 10 MEQ tablet Take 1 tablet (10 mEq total) by mouth as directed. Take 10 meq twice a day every Mon, Wed and Fri's     No current facility-administered medications for this visit.     REVIEW OF SYSTEMS:   .10 Point review of Systems was done is negative except as noted above.   PHYSICAL EXAMINATION:  ECOG PERFORMANCE STATUS: 1 - Symptomatic but completely ambulatory  . Vitals:   01/18/18 0952  BP: (!) 144/37  Pulse:  (!) 54  Resp: 17  Temp: 98.6 F (37 C)  SpO2: 100%   Filed Weights   01/18/18 0952  Weight: 130 lb 9.6 oz (59.2 kg)   .Body mass index is 27.3 kg/m.  Marland Kitchen GENERAL:alert, in no acute distress and comfortable SKIN: no acute rashes, no significant lesions EYES: conjunctiva are pink and non-injected, sclera anicteric OROPHARYNX: MMM, no exudates, no oropharyngeal erythema or ulceration NECK: supple, no JVD LYMPH:  no palpable lymphadenopathy in the cervical, axillary or inguinal regions LUNGS: clear to auscultation b/l with normal respiratory effort HEART: regular rate & rhythm ABDOMEN:  normoactive bowel sounds , non tender, not distended. Extremity: 1+ pedal edema PSYCH: alert & oriented x 3 with fluent speech NEURO: no focal motor/sensory deficits    LABORATORY DATA:    CBC Latest Ref Rng & Units 01/18/2018 10/21/2017 10/19/2017  WBC 3.9 - 10.3 K/uL 4.2 4.2 4.2  Hemoglobin 11.6 - 15.9 g/dL 8.8(L) 9.4(L) 9.1(L)  Hematocrit 34.8 - 46.6 % 28.7(L) 31.2(L) 28.3(L)  Platelets 145 - 400 K/uL 229 236 260    . CBC    Component Value Date/Time   WBC 4.2 01/18/2018 0929   WBC 7.2 06/20/2017 1326   WBC 6.9 06/16/2017 1041   RBC 2.90 (L) 01/18/2018 0929   RBC 2.90 (L) 01/18/2018 0929   HGB 8.8 (L) 01/18/2018 0929   HGB 9.1 (L) 10/19/2017 1149   HGB 10.8 (L) 06/20/2017 1326   HCT 28.7 (L) 01/18/2018 0929   HCT 28.3 (L) 10/19/2017 1149   HCT 34.9 06/20/2017 1326   PLT 229 01/18/2018 0929   PLT 260 10/19/2017 1149   MCV 99.0 01/18/2018 0929   MCV 90 10/19/2017 1149   MCV 94.8 06/20/2017 1326   MCH 30.3 01/18/2018 0929   MCHC 30.7 (L) 01/18/2018 0929   RDW 15.8 (H) 01/18/2018 0929   RDW 14.6 10/19/2017 1149   RDW 14.2 06/20/2017 1326   LYMPHSABS 0.6 (L) 01/18/2018 0929   LYMPHSABS 0.5 (L) 10/19/2017 1149   LYMPHSABS 0.6 (L) 06/20/2017 1326   MONOABS 0.5  01/18/2018 0929   MONOABS 0.7 06/20/2017 1326   EOSABS 0.2 01/18/2018 0929   EOSABS 0.2 10/19/2017 1149   BASOSABS 0.0  01/18/2018 0929   BASOSABS 0.0 10/19/2017 1149   BASOSABS 0.0 06/20/2017 1326    . CMP Latest Ref Rng & Units 01/18/2018 10/21/2017 10/19/2017  Glucose 70 - 140 mg/dL 102 127 114(H)  BUN 7 - 26 mg/dL 29(H) 27(H) 21  Creatinine 0.60 - 1.10 mg/dL 1.16(H) 1.28(H) 1.09(H)  Sodium 136 - 145 mmol/L 141 142 142  Potassium 3.5 - 5.1 mmol/L 4.7 5.1 5.0  Chloride 98 - 109 mmol/L 112(H) 107 105  CO2 22 - 29 mmol/L '22 27 24  ' Calcium 8.4 - 10.4 mg/dL 8.7 8.7 8.5(L)  Total Protein 6.4 - 8.3 g/dL 6.4 6.4 5.9(L)  Total Bilirubin 0.2 - 1.2 mg/dL 0.4 0.5 0.3  Alkaline Phos 40 - 150 U/L 79 63 59  AST 5 - 34 U/L '14 15 17  ' ALT 0 - 55 U/L <6 <6 4   . Lab Results  Component Value Date   IRON 43 01/18/2018   TIBC 215 (L) 01/18/2018   IRONPCTSAT 20 (L) 01/18/2018   (Iron and TIBC)  Lab Results  Component Value Date   FERRITIN 244 01/18/2018       ASSESSMENT & PLAN:   84 year old Caucasian female with  #1 Microcytic anemia likely related to iron deficiency from chronic GI bleeding. No known disorder causing chronic inflammation at this time.  #2 history of recurrent GI bleeding related to upper and lower GI tract AVMs and gastric erosions. Has had several admissions with requirement of multiple PRBC transfusions. Dropping ferritin suggests ongoing slow GI losses. Hemoglobin down from 10.8 to 9.4. No clinically overt GI bleeding noted.  #3 Hypothyroidism on levothyroxine .  #4 ?? Reaction to [facility] - current hospital admission that shortness of breath appears to be more likely related to CHF decompensation than allergic reaction to Feraheme. However patient is very anxious and hesitant to get this preparation. PLAN -Continue daily multivitamin orally. -Continue close followup with primary care physician for management of other medical issues. (HTN control) -Discussed pt labwork today 01/18/18; Hgb has dropped from 9.4 to 8.8 after two IV iron doses.  -Primary concern that the pt continues to  lose blood -Folic acid and Y19 deficiencies.  -Recommend taking a B complex vitamin.  -Would like to keep Ferritin >100.  -Recommend re-checking thyroid levels with PCP and taking her Levothyroxine alone, an hour to two hours before breakfast and without milk to increase absorption.  -Will hold PO iron since it is not adding much to the amount she received IV iron -Would like to see pt back in 2 months since her Hgb has dropped since last visit, unless she develops any light headedness or dizziness before then.  -currently ferritin >100 with no indication for additional IV iron at this time.   RTC with Dr [name] in 2 months with labs   All of the patients questions were answered with apparent satisfaction. The patient knows to call the clinic with any problems, questions or concerns.  I spent 15 minutes counseling the pt face to face. The toal time spent in the appt was 20 minutes and more than 50% was on counseling and direct patient cares.    Sullivan Lone MD [place] AAHIVMS [facility] Hematology/Oncology [facility]  (Office):       [phone] (Work cell):  [phone] (Fax):  [phone]  This document serves as a record of services personally performed by Sullivan Lone, MD. It was created on his behalf by Baldwin Jamaica, a trained medical scribe. The creation of this record is based on the scribe's personal observations and the provider's statements to them.   .I have reviewed the above documentation for accuracy and completeness, and I agree with the above. Brunetta Genera MD MS

## 2018-01-18 ENCOUNTER — Inpatient Hospital Stay: Payer: Medicare HMO | Attending: Hematology | Admitting: Hematology

## 2018-01-18 ENCOUNTER — Telehealth: Payer: Self-pay

## 2018-01-18 ENCOUNTER — Encounter: Payer: Self-pay | Admitting: Hematology

## 2018-01-18 ENCOUNTER — Inpatient Hospital Stay: Payer: Medicare HMO

## 2018-01-18 VITALS — BP 144/37 | HR 54 | Temp 98.6°F | Resp 17 | Ht <= 58 in | Wt 130.6 lb

## 2018-01-18 DIAGNOSIS — E119 Type 2 diabetes mellitus without complications: Secondary | ICD-10-CM | POA: Diagnosis not present

## 2018-01-18 DIAGNOSIS — K922 Gastrointestinal hemorrhage, unspecified: Secondary | ICD-10-CM | POA: Diagnosis not present

## 2018-01-18 DIAGNOSIS — E039 Hypothyroidism, unspecified: Secondary | ICD-10-CM | POA: Diagnosis not present

## 2018-01-18 DIAGNOSIS — D509 Iron deficiency anemia, unspecified: Secondary | ICD-10-CM | POA: Diagnosis not present

## 2018-01-18 DIAGNOSIS — I509 Heart failure, unspecified: Secondary | ICD-10-CM | POA: Insufficient documentation

## 2018-01-18 DIAGNOSIS — I110 Hypertensive heart disease with heart failure: Secondary | ICD-10-CM | POA: Diagnosis not present

## 2018-01-18 DIAGNOSIS — D500 Iron deficiency anemia secondary to blood loss (chronic): Secondary | ICD-10-CM

## 2018-01-18 DIAGNOSIS — D649 Anemia, unspecified: Secondary | ICD-10-CM

## 2018-01-18 LAB — CMP (CANCER CENTER ONLY)
ALBUMIN: 3.6 g/dL (ref 3.5–5.0)
ALK PHOS: 79 U/L (ref 40–150)
AST: 14 U/L (ref 5–34)
Anion gap: 7 (ref 3–11)
BUN: 29 mg/dL — AB (ref 7–26)
CALCIUM: 8.7 mg/dL (ref 8.4–10.4)
CHLORIDE: 112 mmol/L — AB (ref 98–109)
CO2: 22 mmol/L (ref 22–29)
CREATININE: 1.16 mg/dL — AB (ref 0.60–1.10)
GFR, EST AFRICAN AMERICAN: 49 mL/min — AB (ref >60)
GFR, Estimated: 43 mL/min — ABNORMAL LOW (ref >60)
GLUCOSE: 102 mg/dL (ref 70–140)
Potassium: 4.7 mmol/L (ref 3.5–5.1)
SODIUM: 141 mmol/L (ref 136–145)
Total Bilirubin: 0.4 mg/dL (ref 0.2–1.2)
Total Protein: 6.4 g/dL (ref 6.4–8.3)

## 2018-01-18 LAB — CBC WITH DIFFERENTIAL (CANCER CENTER ONLY)
BASOS PCT: 1 %
Basophils Absolute: 0 10*3/uL (ref 0.0–0.1)
EOS ABS: 0.2 10*3/uL (ref 0.0–0.5)
Eosinophils Relative: 4 %
HCT: 28.7 % — ABNORMAL LOW (ref 34.8–46.6)
HEMOGLOBIN: 8.8 g/dL — AB (ref 11.6–15.9)
LYMPHS ABS: 0.6 10*3/uL — AB (ref 0.9–3.3)
Lymphocytes Relative: 13 %
MCH: 30.3 pg (ref 25.1–34.0)
MCHC: 30.7 g/dL — AB (ref 31.5–36.0)
MCV: 99 fL (ref 79.5–101.0)
MONO ABS: 0.5 10*3/uL (ref 0.1–0.9)
MONOS PCT: 12 %
Neutro Abs: 3 10*3/uL (ref 1.5–6.5)
Neutrophils Relative %: 70 %
Platelet Count: 229 10*3/uL (ref 145–400)
RBC: 2.9 MIL/uL — ABNORMAL LOW (ref 3.70–5.45)
RDW: 15.8 % — ABNORMAL HIGH (ref 11.2–14.5)
WBC Count: 4.2 10*3/uL (ref 3.9–10.3)

## 2018-01-18 LAB — IRON AND TIBC
Iron: 43 ug/dL (ref 41–142)
Saturation Ratios: 20 % — ABNORMAL LOW (ref 21–57)
TIBC: 215 ug/dL — AB (ref 236–444)
UIBC: 172 ug/dL

## 2018-01-18 LAB — RETICULOCYTES
RBC.: 2.9 MIL/uL — AB (ref 3.70–5.45)
RETIC CT PCT: 2 % (ref 0.7–2.1)
Retic Count, Absolute: 58 10*3/uL (ref 33.7–90.7)

## 2018-01-18 LAB — FERRITIN: FERRITIN: 244 ng/mL (ref 9–269)

## 2018-01-18 NOTE — Telephone Encounter (Signed)
Printed avs and calender for upcoming appointment. Per 5/1 los

## 2018-02-05 ENCOUNTER — Other Ambulatory Visit: Payer: Self-pay | Admitting: Family Medicine

## 2018-02-07 ENCOUNTER — Telehealth: Payer: Self-pay | Admitting: Family Medicine

## 2018-02-07 ENCOUNTER — Ambulatory Visit (INDEPENDENT_AMBULATORY_CARE_PROVIDER_SITE_OTHER): Payer: Medicare HMO | Admitting: Family Medicine

## 2018-02-07 ENCOUNTER — Encounter: Payer: Self-pay | Admitting: Family Medicine

## 2018-02-07 VITALS — BP 142/82 | HR 69 | Temp 97.8°F | Ht <= 58 in | Wt 125.2 lb

## 2018-02-07 DIAGNOSIS — E1159 Type 2 diabetes mellitus with other circulatory complications: Secondary | ICD-10-CM

## 2018-02-07 DIAGNOSIS — E039 Hypothyroidism, unspecified: Secondary | ICD-10-CM | POA: Diagnosis not present

## 2018-02-07 DIAGNOSIS — M25552 Pain in left hip: Secondary | ICD-10-CM | POA: Diagnosis not present

## 2018-02-07 DIAGNOSIS — I10 Essential (primary) hypertension: Secondary | ICD-10-CM | POA: Diagnosis not present

## 2018-02-07 DIAGNOSIS — E785 Hyperlipidemia, unspecified: Secondary | ICD-10-CM

## 2018-02-07 DIAGNOSIS — D692 Other nonthrombocytopenic purpura: Secondary | ICD-10-CM

## 2018-02-07 DIAGNOSIS — I5022 Chronic systolic (congestive) heart failure: Secondary | ICD-10-CM

## 2018-02-07 DIAGNOSIS — E118 Type 2 diabetes mellitus with unspecified complications: Secondary | ICD-10-CM

## 2018-02-07 DIAGNOSIS — E1169 Type 2 diabetes mellitus with other specified complication: Secondary | ICD-10-CM

## 2018-02-07 DIAGNOSIS — H02005 Unspecified entropion of left lower eyelid: Secondary | ICD-10-CM | POA: Diagnosis not present

## 2018-02-07 LAB — POCT GLYCOSYLATED HEMOGLOBIN (HGB A1C): Hemoglobin A1C: 5.8 % — AB (ref 4.0–5.6)

## 2018-02-07 NOTE — Patient Instructions (Addendum)
You need to see an ophthalmologist (Md) Use heat for 20 minutes three times a day.Tylenol for pain Take 2 Aleve twice per day for your back pain.  If that 2 Aleve twice per day does not work call me

## 2018-02-07 NOTE — Progress Notes (Signed)
   Subjective:    Patient ID: Connie [name], female    DOB: [birth year], 84 y.o.   MRN: [id number]  HPI She is here for follow-up visit.  She recently went to low normal and did do a lot of walking.  She also indicates that she slipped and fell and in the process flexed her left knee to the point that it actually caused gluteal pain.  The pain is slightly better than when this occurred a week or so ago.  She also was placed on a different thyroid medication due to an elevated TSH and is here for follow-up.  She also has underlying diabetes.  In general she has been doing fairly well.   Review of Systems     Objective:   Physical Exam Alert and in no distress.  Left eye does show a lower lid to be more visible showing some erythema especially in the medial aspect. Exam of the gluteal area shows no ecchymosis.  She is slightly tender over the initial tuberosity. Exam of her arms does show extensive purpura especially over her hands. A1c is 5.8      Assessment & Plan:  Hypertension associated with diabetes ([name])  Chronic systolic CHF (congestive heart failure) ([name])  Type 2 diabetes mellitus with complication, without long-term current use of insulin ([place]) - Plan: HgB A1c  Hypothyroidism, unspecified type - Plan: TSH  Senile purpura ([place])  Hyperlipidemia associated with type 2 diabetes mellitus ([name])  Left hip pain You need to see an ophthalmologist ([place]) Use heat for 20 minutes three times a day.Tylenol for pain Take 2 Aleve twice per day for your back pain.  If that 2 Aleve twice per day does not work call me Diabetes seems to be under fairly good control.

## 2018-02-07 NOTE — Telephone Encounter (Signed)
Dr. Latanya Presser office called from [facility] to get office notes and referral info to see pt for her follow up eye exam.

## 2018-02-08 LAB — TSH: TSH: 0.471 u[IU]/mL (ref 0.450–4.500)

## 2018-02-09 NOTE — Telephone Encounter (Signed)
Pt was seen on the 21st of may. [place]

## 2018-02-14 ENCOUNTER — Telehealth: Payer: Self-pay

## 2018-02-14 DIAGNOSIS — M25552 Pain in left hip: Secondary | ICD-10-CM

## 2018-02-14 NOTE — Telephone Encounter (Signed)
Patient states that she came in for left hip pain and it has not gotten any better.  She was calling to see if you want to see her or could you call her something in to [facility] in [place].   She can be reached at [phone].

## 2018-02-15 ENCOUNTER — Ambulatory Visit
Admission: RE | Admit: 2018-02-15 | Discharge: 2018-02-15 | Disposition: A | Discharge status: Home / Self Care | Payer: Medicare HMO | Source: Ambulatory Visit | Attending: Family Medicine | Admitting: Family Medicine

## 2018-02-15 DIAGNOSIS — S32512A Fracture of superior rim of left pubis, initial encounter for closed fracture: Secondary | ICD-10-CM | POA: Diagnosis not present

## 2018-02-15 DIAGNOSIS — M25552 Pain in left hip: Secondary | ICD-10-CM

## 2018-02-15 NOTE — Telephone Encounter (Signed)
Called pt to let her know that she has an order to have a x ray of her hip ordered. She was advised to go by [facility] imaging to have it done. [place]

## 2018-02-15 NOTE — Telephone Encounter (Signed)
I put an order in for her to get a hip x-ray before we do anything else

## 2018-02-16 ENCOUNTER — Other Ambulatory Visit: Payer: Self-pay

## 2018-02-16 ENCOUNTER — Telehealth: Payer: Self-pay

## 2018-02-16 DIAGNOSIS — S32502A Unspecified fracture of left pubis, initial encounter for closed fracture: Secondary | ICD-10-CM

## 2018-02-16 NOTE — Telephone Encounter (Signed)
Pt son called stating he would like his mom to be referred to Dr Gaynelle Arabian (orthopedics) as she has seen this provider before. Please refer and inform patient son once this is done.   Son can be reached at [phone]. His name is Richard.

## 2018-02-16 NOTE — Telephone Encounter (Signed)
Pt info was faxed over to their office and Dr Wynelle Link team will try to sch appt soon and will contact son with more info. [place]

## 2018-02-17 ENCOUNTER — Telehealth: Payer: Self-pay

## 2018-02-17 ENCOUNTER — Other Ambulatory Visit: Payer: Self-pay | Admitting: Family Medicine

## 2018-02-17 NOTE — Telephone Encounter (Addendum)
**Note De-Identified  Obfuscation** We received a Tier Exception Request Form to complete for the pts Entresto. I have completed the form and placed it in Dr Marlou Porch mail bin awaiting his signature.

## 2018-02-21 ENCOUNTER — Ambulatory Visit (INDEPENDENT_AMBULATORY_CARE_PROVIDER_SITE_OTHER): Payer: Medicare HMO | Admitting: Orthopaedic Surgery

## 2018-02-21 NOTE — Telephone Encounter (Signed)
**Note De-Identified Chloe Bluett Obfuscation** Letter received from [facility] Zain Bingman fax stating that the tier exception on Entrestop has been denied. Reason: [employer] follows Medicare rules- there is no lower cost brand name drug for treating the same condition on the pts plans drug list. The pt does not qualify for a tiering exception per medicare rules.  I have notified the pt and I have faxed letter to the pts pharmacy, [facility].  I have also mailed the pt an [facility] pt assistance application as discussed in our conversation.

## 2018-02-21 NOTE — Telephone Encounter (Signed)
Dr Marlou Porch has signed the application and I have faxed all to [facility].

## 2018-02-23 DIAGNOSIS — Z96653 Presence of artificial knee joint, bilateral: Secondary | ICD-10-CM | POA: Diagnosis not present

## 2018-02-23 DIAGNOSIS — M170 Bilateral primary osteoarthritis of knee: Secondary | ICD-10-CM | POA: Diagnosis not present

## 2018-02-23 DIAGNOSIS — Z471 Aftercare following joint replacement surgery: Secondary | ICD-10-CM | POA: Diagnosis not present

## 2018-02-23 DIAGNOSIS — S329XXA Fracture of unspecified parts of lumbosacral spine and pelvis, initial encounter for closed fracture: Secondary | ICD-10-CM | POA: Diagnosis not present

## 2018-02-27 ENCOUNTER — Other Ambulatory Visit: Payer: Self-pay

## 2018-02-27 ENCOUNTER — Telehealth: Payer: Self-pay

## 2018-02-27 DIAGNOSIS — I428 Other cardiomyopathies: Secondary | ICD-10-CM

## 2018-02-27 MED ORDER — SACUBITRIL-VALSARTAN 49-51 MG PO TABS: 1.0000 | ORAL_TABLET | Freq: Two times a day (BID) | ORAL | 3 refills | Status: DC

## 2018-02-27 NOTE — Telephone Encounter (Signed)
The pts son, [name], dropped the pts completed [employer] pt support program enrollment form (application). I have completed the provider part, printed an Raiford Noble, Dr Marlou Porch has signed both and I have faxed all to [facility].

## 2018-02-28 NOTE — Telephone Encounter (Addendum)
**Note De-Identified Connie [name] Obfuscation** Letter received Connie [name] fax from [facility] stating that they have been informed that a PA for the pt has been approved by her insurance provider on 12/29/16.  According to the letter they are going to contact the pt and advise her that she has a deductible to meet and to offer [employer] Pt Asst if she cannot afford the $45 a month for her [name] once her deductible is met.  I have sent approval letter to be scanned into the pts chart.

## 2018-03-07 DIAGNOSIS — H02005 Unspecified entropion of left lower eyelid: Secondary | ICD-10-CM | POA: Diagnosis not present

## 2018-03-13 ENCOUNTER — Other Ambulatory Visit: Payer: Self-pay | Admitting: Family Medicine

## 2018-03-14 NOTE — Progress Notes (Signed)
Connie Kitchen    HEMATOLOGY/ONCOLOGY CLINIC NOTE  Date of Service: 03/15/18  Patient Care Team: [name], MD as PCP - General (Family Medicine)  CHIEF COMPLAINTS/PURPOSE OF CONSULTATION:  Iron deficiency anemia due to GI bleeding from multiple AVMs  DIAGNOSIS: Iron deficiency anemia likely related to ongoing chronic GI bleeding from multiple AVMs  TREATMENT -IV iron replacement with injectafer as needed (tolerated well). (Had some shortness of breath with IV Feraheme - which was likely fluid overload as opposed to a true allergic reaction but patient has been hesitant to take this)  HISTORY OF PRESENTING ILLNESS:  Please see previous note for details  INTERVAL HISTORY  [name] is here for followup for her IDA. The patient's last visit with [place] was on 01/18/18. She is accompanied today by her son. The pt reports that she is doing well overall.   The pt reports that she is feeling quite fatigued today, and denies any noticeable bleeding, blood in the stools or back stools. She has not felt obviously light headed or dizzy. She has been taking Vitamin B complex since her last visit. She has also begun taking her Levothyroxine by itself, as was recommended at our last visit, and her TSH on 02/07/18 had normalized.   Lab results today (03/15/18) of CBC and Reticulocytes is as follows: all values are WNL except for RBC at 2.37, HGB at 6.9, HCT at 23.2, MCHC at 29.7, RDW at 15.8, Lymphs abs at 500, Retic ct pct at 5.2%, Retic ct abs at 123.2k. Ferritin 03/15/18 is 115 Vitamin B12 is 174 TSH 02/07/18 normalized at 0.471  On review of systems, pt reports fatigue, leg swelling, eating well, and denies bleeding, blood in the stools, black stools, light headedness, dizziness, and any other symptoms.    MEDICAL HISTORY:  Past Medical History:  Diagnosis Date  . Allergy    RHINITIS  . Angiodysplasia of duodenum    hx/notes 11/21/2015  . Angiodysplasia of stomach    hx/notes 11/21/2015  .  Arthritis    "joints ache"  . Bleeding gastric ulcer    hx/notes 11/21/2015  . Chronic blood loss anemia    secondary to angiodysplasia of the stomach and duodenum as well as history of bleeding gastric ulcer hx/notes 11/21/2015  . Chronic systolic CHF (congestive heart failure) ([place]) 12/23/2015  . History of blood transfusion 01/2015; 06/2015; 11/21/2015  . Hypercholesteremia   . Hypertension   . Hypothyroidism   . Obesity   . Pneumonia "several times"  . Type II diabetes mellitus ([place])     SURGICAL HISTORY: Past Surgical History:  Procedure Laterality Date  . CARDIAC CATHETERIZATION N/A 12/19/2015   Procedure: Left Heart Cath and Coronary Angiography;  Surgeon: Leonie Man, MD;  Location: [place] [facility];  Service: Cardiovascular;  Laterality: N/A;  . COLONOSCOPY N/A 08/30/2014   Procedure: COLONOSCOPY;  Surgeon: Beryle Beams, MD;  Location: [facility] ENDOSCOPY;  Service: Endoscopy;  Laterality: N/A;  . COLONOSCOPY N/A 02/21/2015   Procedure: COLONOSCOPY;  Surgeon: Carol Ada, MD;  Location: [facility] ENDOSCOPY;  Service: Endoscopy;  Laterality: N/A;  . ENTEROSCOPY N/A 06/06/2015   Procedure: ENTEROSCOPY;  Surgeon: Carol Ada, MD;  Location: [facility]OSCOPY;  Service: Endoscopy;  Laterality: N/A;  . FRACTURE SURGERY    . GIVENS CAPSULE STUDY N/A 05/15/2015   Procedure: GIVENS CAPSULE STUDY;  Surgeon: Carol Ada, MD;  Location: [facility] ENDOSCOPY;  Service: Endoscopy;  Laterality: N/A;  . HOT HEMOSTASIS N/A 08/30/2014   Procedure: HOT HEMOSTASIS (ARGON  PLASMA COAGULATION/BICAP);  Surgeon: Beryle Beams, MD;  Location: [facility]OS[facility];  Service: Endoscopy;  Laterality: N/A;  . HOT HEMOSTASIS N/A 06/06/2015   Procedure: HOT HEMOSTASIS (ARGON PLASMA COAGULATION/BICAP);  Surgeon: Carol Ada, MD;  Location: [facility] ENDOSCOPY;  Service: Endoscopy;  Laterality: N/A;  . JOINT REPLACEMENT    . REVISION TOTAL KNEE ARTHROPLASTY Bilateral 2004-2015  . SHOULDER OPEN ROTATOR CUFF REPAIR Right 12/2004   open subacromial  decompression, distal clavicle  resection, rotator cuff repair/notes 02/02/2011  . TONSILLECTOMY  1940s  . TOTAL KNEE ARTHROPLASTY Bilateral ~ 2002  . TOTAL THYROIDECTOMY  ~ 1966    SOCIAL HISTORY: Social History   Socioeconomic History  . Marital status: Married    Spouse name: Not on file  . Number of children: Not on file  . Years of education: Not on file  . Highest education level: Not on file  Occupational History  . Not on file  Social Needs  . Financial resource strain: Not on file  . Food insecurity:    Worry: Not on file    Inability: Not on file  . Transportation needs:    Medical: Not on file    Non-medical: Not on file  Tobacco Use  . Smoking status: Never Smoker  . Smokeless tobacco: Never Used  Substance and Sexual Activity  . Alcohol use: No  . Drug use: No  . Sexual activity: Not Currently  Lifestyle  . Physical activity:    Days per week: Not on file    Minutes per session: Not on file  . Stress: Not on file  Relationships  . Social connections:    Talks on phone: Not on file    Gets together: Not on file    Attends religious service: Not on file    Active member of club or organization: Not on file    Attends meetings of clubs or organizations: Not on file    Relationship status: Not on file  . Intimate partner violence:    Fear of current or ex partner: Not on file    Emotionally abused: Not on file    Physically abused: Not on file    Forced sexual activity: Not on file  Other Topics Concern  . Not on file  Social History Narrative  . Not on file    FAMILY HISTORY: Family History  Problem Relation Age of Onset  . Asthma Mother   . Ulcers Father   . Anemia Sister     ALLERGIES:  has No Known Allergies.  MEDICATIONS:  Current Outpatient Medications  Medication Sig Dispense Refill  . ACCU-CHEK FASTCLIX LANCETS MISC 1 each by Does not apply route 2 (two) times daily. DX E11.9 204 each 3  . acetaminophen (TYLENOL) 500 MG tablet Take  500 mg by mouth every 6 (six) hours as needed.    Connie Kitchen amLODipine (NORVASC) 10 MG tablet TAKE 1 TABLET (10 MG TOTAL) BY MOUTH DAILY. 90 tablet 1  . atorvastatin (LIPITOR) 40 MG tablet TAKE 1 TABLET EVERY DAY 90 tablet 3  . Blood Glucose Monitoring Suppl (ACCU-CHEK GUIDE) w/Device KIT 1 each by Does not apply route 2 (two) times daily. DX E11.9 1 kit 0  . carvedilol (COREG) 12.5 MG tablet TAKE 1 TABLET (12.5 MG TOTAL) BY MOUTH 2 (TWO) TIMES DAILY WITH A MEAL. 180 tablet 1  . furosemide (LASIX) 40 MG tablet TAKE 1 TABLET (40 MG TOTAL) BY MOUTH DAILY. 90 tablet 1  . glucose blood (ACCU-CHEK GUIDE) test strip  Patient to test 2 times daily DX E11.9 Use as instructed 200 each 3  . Lancets Misc. (ACCU-CHEK FASTCLIX LANCET) KIT 1 each by Does not apply route 2 (two) times daily. DX E11.9 1 kit 0  . levothyroxine (SYNTHROID, LEVOTHROID) 125 MCG tablet TAKE 1 TABLET EVERY DAY 90 tablet 3  . Multiple Vitamins-Minerals (WOMENS MULTIVITAMIN PLUS PO) Take 1 tablet by mouth daily.    . pantoprazole (PROTONIX) 20 MG tablet TAKE 1 TABLET (20 MG TOTAL) BY MOUTH DAILY. 90 tablet 3  . pioglitazone (ACTOS) 30 MG tablet TAKE 1 TABLET (30 MG TOTAL) BY MOUTH DAILY. 90 tablet 1  . potassium chloride (K-DUR) 10 MEQ tablet Take 1 tablet (10 mEq total) by mouth as directed. Take 10 meq twice a day every Mon, Wed and Fri's    . potassium chloride (K-DUR,KLOR-CON) 10 MEQ tablet TAKE 1 TABLET TWICE DAILY 180 tablet 1  . sacubitril-valsartan (ENTRESTO) 49-51 MG Take 1 tablet by mouth 2 (two) times daily. 180 tablet 3   No current facility-administered medications for this visit.     REVIEW OF SYSTEMS:   A 10+ POINT REVIEW OF SYSTEMS WAS OBTAINED including neurology, dermatology, psychiatry, cardiac, respiratory, lymph, extremities, GI, GU, Musculoskeletal, constitutional, breasts, reproductive, HEENT.  All pertinent positives are noted in the HPI.  All others are negative.   PHYSICAL EXAMINATION:  ECOG PERFORMANCE STATUS: 1 -  Symptomatic but completely ambulatory  Vitals:   03/15/18 1157  BP: (!) 128/34  Pulse: 62  Resp: 18  Temp: 97.6 F (36.4 C)  SpO2: 99%   Filed Weights   03/15/18 1157  Weight: 123 lb 1.6 oz (55.8 kg)   .Body mass index is 27.6 kg/m.  Connie Kitchen GENERAL:alert, in no acute distress and comfortable SKIN: no acute rashes, no significant lesions EYES: conjunctiva are pink and non-injected, sclera anicteric OROPHARYNX: MMM, no exudates, no oropharyngeal erythema or ulceration NECK: supple, no JVD LYMPH:  no palpable lymphadenopathy in the cervical, axillary or inguinal regions LUNGS: clear to auscultation b/l with normal respiratory effort HEART: regular rate & rhythm ABDOMEN:  normoactive bowel sounds , non tender, not distended. Extremity: 2+ pedal edema PSYCH: alert & oriented x 3 with fluent speech NEURO: no focal motor/sensory deficits   LABORATORY DATA:    CBC Latest Ref Rng & Units 03/15/2018 01/18/2018 10/21/2017  WBC 3.9 - 10.3 K/uL 7.0 4.2 4.2  Hemoglobin 11.6 - 15.9 g/dL 6.9(LL) 8.8(L) 9.4(L)  Hematocrit 34.8 - 46.6 % 23.2(L) 28.7(L) 31.2(L)  Platelets 145 - 400 K/uL 371 229 236    . CBC    Component Value Date/Time   WBC 7.0 03/15/2018 1143   WBC 7.2 06/20/2017 1326   WBC 6.9 06/16/2017 1041   RBC 2.37 (L) 03/15/2018 1143   RBC 2.37 (L) 03/15/2018 1143   HGB 6.9 (LL) 03/15/2018 1143   HGB 9.1 (L) 10/19/2017 1149   HGB 10.8 (L) 06/20/2017 1326   HCT 23.2 (L) 03/15/2018 1143   HCT 28.3 (L) 10/19/2017 1149   HCT 34.9 06/20/2017 1326   PLT 371 03/15/2018 1143   PLT 260 10/19/2017 1149   MCV 97.9 03/15/2018 1143   MCV 90 10/19/2017 1149   MCV 94.8 06/20/2017 1326   MCH 29.1 03/15/2018 1143   MCHC 29.7 (L) 03/15/2018 1143   RDW 15.8 (H) 03/15/2018 1143   RDW 14.6 10/19/2017 1149   RDW 14.2 06/20/2017 1326   LYMPHSABS 0.5 (L) 03/15/2018 1143   LYMPHSABS 0.5 (L) 10/19/2017 1149   LYMPHSABS 0.6 (L)  06/20/2017 1326   MONOABS 0.8 03/15/2018 1143   MONOABS 0.7  06/20/2017 1326   EOSABS 0.5 03/15/2018 1143   EOSABS 0.2 10/19/2017 1149   BASOSABS 0.0 03/15/2018 1143   BASOSABS 0.0 10/19/2017 1149   BASOSABS 0.0 06/20/2017 1326    . CMP Latest Ref Rng & Units 01/18/2018 10/21/2017 10/19/2017  Glucose 70 - 140 mg/dL 102 127 114(H)  BUN 7 - 26 mg/dL 29(H) 27(H) 21  Creatinine 0.60 - 1.10 mg/dL 1.16(H) 1.28(H) 1.09(H)  Sodium 136 - 145 mmol/L 141 142 142  Potassium 3.5 - 5.1 mmol/L 4.7 5.1 5.0  Chloride 98 - 109 mmol/L 112(H) 107 105  CO2 22 - 29 mmol/L _0 Calcium 8.4 - 10.4 mg/dL 8.7 8.7 8.5(L)  Total Protein 6.4 - 8.3 g/dL 6.4 6.4 5.9(L)  Total Bilirubin 0.2 - 1.2 mg/dL 0.4 0.5 0.3  Alkaline Phos 40 - 150 U/L 79 63 59  AST 5 - 34 U/L _1 ALT 0 - 55 U/L <6 <6 4   . Lab Results  Component Value Date   IRON 43 01/18/2018   TIBC 215 (L) 01/18/2018   IRONPCTSAT 20 (L) 01/18/2018   (Iron and TIBC)  Lab Results  Component Value Date   FERRITIN 244 01/18/2018       ASSESSMENT & PLAN:   84 year old Caucasian female with  #1 Microcytic anemia likely related to iron deficiency from chronic GI bleeding. No known disorder causing chronic inflammation at this time.  #2 history of recurrent GI bleeding related to upper and lower GI tract AVMs and gastric erosions. Has had several admissions with requirement of multiple PRBC transfusions. Dropping ferritin suggests ongoing slow GI losses. Hemoglobin down from 10.8 to 9.4. No clinically overt GI bleeding noted.  #3 Hypothyroidism on levothyroxine .  #4 ?? Reaction to [facility] - current hospital admission that shortness of breath appears to be more likely related to CHF decompensation than allergic reaction to Feraheme. However patient is very anxious and hesitant to get this preparation. PLAN  -Discussed pt labwork today, 03/15/18; HGB at 6.9 -Will order 2 units PRBCs - IV Injectafer weekly x 2 dose -IV Lasix to avoid fluid overload with blood transfusions, as 2+ leg swelling is  noted today  -Continue daily multivitamin orally. -Continue close followup with primary care physician for management of other medical issues. (HTN control) -Primary concern that the pt continues to lose blood -Folic acid and K48 deficiencies.  -Recommend taking a B complex vitamin.  -take 12 SL 1047mg -Would like to keep Ferritin >100.  -Recommend re-checking thyroid levels with PCP and taking her Levothyroxine alone, an hour to two hours before breakfast and without milk to increase absorption.    -PRBC transfusion 2 units today -IV injectafer weekly x 2 doses ASAP -RTC with Dr KIrene Limboin 8 weeks with labs    All of the patients questions were answered with apparent satisfaction. The patient knows to call the clinic with any problems, questions or concerns.  . The total time spent in the appointment was 25 minutes and more than 50% was on counseling and direct patient cares.      GSullivan LoneMD MInmanAAHIVMS SCharles A Dean Memorial HospitalCNovamed Surgery Center Of [facility] LLCHematology/Oncology Physician CMiami [facility] Hospital South (Office):       [phone](Work cell):  [phone](Fax):           [phone] I, SBaldwin Jamaica am acting as a scribe for Dr KIrene Limbo   .I have reviewed the  above documentation for accuracy and completeness, and I agree with the above. Brunetta Genera MD

## 2018-03-15 ENCOUNTER — Telehealth: Payer: Self-pay | Admitting: Hematology

## 2018-03-15 ENCOUNTER — Inpatient Hospital Stay: Payer: Commercial Managed Care - HMO

## 2018-03-15 ENCOUNTER — Other Ambulatory Visit: Payer: Self-pay

## 2018-03-15 ENCOUNTER — Inpatient Hospital Stay: Payer: Commercial Managed Care - HMO | Attending: Hematology | Admitting: Hematology

## 2018-03-15 VITALS — BP 128/34 | HR 62 | Temp 97.6°F | Resp 18 | Ht <= 58 in | Wt 123.1 lb

## 2018-03-15 DIAGNOSIS — D500 Iron deficiency anemia secondary to blood loss (chronic): Secondary | ICD-10-CM

## 2018-03-15 DIAGNOSIS — E119 Type 2 diabetes mellitus without complications: Secondary | ICD-10-CM | POA: Diagnosis not present

## 2018-03-15 DIAGNOSIS — I5022 Chronic systolic (congestive) heart failure: Secondary | ICD-10-CM | POA: Diagnosis not present

## 2018-03-15 DIAGNOSIS — D649 Anemia, unspecified: Secondary | ICD-10-CM | POA: Diagnosis not present

## 2018-03-15 DIAGNOSIS — E039 Hypothyroidism, unspecified: Secondary | ICD-10-CM | POA: Insufficient documentation

## 2018-03-15 DIAGNOSIS — I110 Hypertensive heart disease with heart failure: Secondary | ICD-10-CM | POA: Diagnosis not present

## 2018-03-15 DIAGNOSIS — D509 Iron deficiency anemia, unspecified: Secondary | ICD-10-CM | POA: Diagnosis not present

## 2018-03-15 DIAGNOSIS — K31811 Angiodysplasia of stomach and duodenum with bleeding: Secondary | ICD-10-CM

## 2018-03-15 DIAGNOSIS — K922 Gastrointestinal hemorrhage, unspecified: Secondary | ICD-10-CM | POA: Diagnosis not present

## 2018-03-15 LAB — CMP (CANCER CENTER ONLY)
ALBUMIN: 3.4 g/dL — AB (ref 3.5–5.0)
ALT: <6 U/L (ref 0–44)
ANION GAP: 10 (ref 5–15)
AST: 11 U/L — ABNORMAL LOW (ref 15–41)
Alkaline Phosphatase: 83 U/L (ref 38–126)
BUN: 34 mg/dL — ABNORMAL HIGH (ref 8–23)
CO2: 26 mmol/L (ref 22–32)
Calcium: 8.7 mg/dL — ABNORMAL LOW (ref 8.9–10.3)
Chloride: 106 mmol/L (ref 98–111)
Creatinine: 1.24 mg/dL — ABNORMAL HIGH (ref 0.44–1.00)
GFR, EST AFRICAN AMERICAN: 46 mL/min — AB (ref >60)
GFR, Estimated: 39 mL/min — ABNORMAL LOW (ref >60)
GLUCOSE: 126 mg/dL — AB (ref 70–99)
POTASSIUM: 4.2 mmol/L (ref 3.5–5.1)
SODIUM: 142 mmol/L (ref 135–145)
TOTAL PROTEIN: 6.2 g/dL — AB (ref 6.5–8.1)
Total Bilirubin: 0.4 mg/dL (ref 0.3–1.2)

## 2018-03-15 LAB — CBC WITH DIFFERENTIAL (CANCER CENTER ONLY)
BASOS ABS: 0 10*3/uL (ref 0.0–0.1)
Basophils Relative: 0 %
Eosinophils Absolute: 0.5 10*3/uL (ref 0.0–0.5)
Eosinophils Relative: 7 %
HEMATOCRIT: 23.2 % — AB (ref 34.8–46.6)
HEMOGLOBIN: 6.9 g/dL — AB (ref 11.6–15.9)
LYMPHS PCT: 7 %
Lymphs Abs: 0.5 10*3/uL — ABNORMAL LOW (ref 0.9–3.3)
MCH: 29.1 pg (ref 25.1–34.0)
MCHC: 29.7 g/dL — ABNORMAL LOW (ref 31.5–36.0)
MCV: 97.9 fL (ref 79.5–101.0)
MONOS PCT: 12 %
Monocytes Absolute: 0.8 10*3/uL (ref 0.1–0.9)
NEUTROS ABS: 5.2 10*3/uL (ref 1.5–6.5)
NEUTROS PCT: 74 %
Platelet Count: 371 10*3/uL (ref 145–400)
RBC: 2.37 MIL/uL — ABNORMAL LOW (ref 3.70–5.45)
RDW: 15.8 % — ABNORMAL HIGH (ref 11.2–14.5)
WBC Count: 7 10*3/uL (ref 3.9–10.3)

## 2018-03-15 LAB — RETICULOCYTES
RBC.: 2.37 MIL/uL — AB (ref 3.70–5.45)
RETIC COUNT ABSOLUTE: 123.2 10*3/uL — AB (ref 33.7–90.7)
Retic Ct Pct: 5.2 % — ABNORMAL HIGH (ref 0.7–2.1)

## 2018-03-15 LAB — IRON AND TIBC
IRON: 42 ug/dL (ref 41–142)
Saturation Ratios: 17 % — ABNORMAL LOW (ref 21–57)
TIBC: 245 ug/dL (ref 236–444)
UIBC: 204 ug/dL

## 2018-03-15 LAB — SAMPLE TO BLOOD BANK

## 2018-03-15 LAB — ABO/RH: ABO/RH(D): A POS

## 2018-03-15 LAB — PREPARE RBC (CROSSMATCH)

## 2018-03-15 LAB — VITAMIN B12: Vitamin B-12: 174 pg/mL — ABNORMAL LOW (ref 180–914)

## 2018-03-15 LAB — FERRITIN: Ferritin: 115 ng/mL (ref 11–307)

## 2018-03-15 MED ORDER — SODIUM CHLORIDE 0.9 % IV SOLN
250.0000 mL | Freq: Once | INTRAVENOUS | Status: AC
  Administered 2018-03-15: 250 mL via INTRAVENOUS

## 2018-03-15 MED ORDER — SODIUM CHLORIDE 0.9% FLUSH
10.0000 mL | INTRAVENOUS | Status: DC | PRN
  Filled 2018-03-15: qty 10

## 2018-03-15 MED ORDER — FUROSEMIDE 10 MG/ML IJ SOLN: 40.0000 mg | Freq: Once | INTRAMUSCULAR | Status: DC

## 2018-03-15 MED ORDER — DIPHENHYDRAMINE HCL 25 MG PO CAPS
25.0000 mg | ORAL_CAPSULE | Freq: Once | ORAL | Status: AC
  Administered 2018-03-15: 25 mg via ORAL

## 2018-03-15 MED ORDER — FUROSEMIDE 10 MG/ML IJ SOLN
INTRAMUSCULAR | Status: AC
  Filled 2018-03-15: qty 2

## 2018-03-15 MED ORDER — ACETAMINOPHEN 325 MG PO TABS
ORAL_TABLET | ORAL | Status: AC
  Filled 2018-03-15: qty 2

## 2018-03-15 MED ORDER — FUROSEMIDE 10 MG/ML IJ SOLN
20.0000 mg | Freq: Once | INTRAMUSCULAR | Status: AC
  Administered 2018-03-15: 20 mg via INTRAVENOUS

## 2018-03-15 MED ORDER — DIPHENHYDRAMINE HCL 25 MG PO CAPS
ORAL_CAPSULE | ORAL | Status: AC
  Filled 2018-03-15: qty 1

## 2018-03-15 MED ORDER — ACETAMINOPHEN 325 MG PO TABS
650.0000 mg | ORAL_TABLET | Freq: Once | ORAL | Status: AC
  Administered 2018-03-15: 650 mg via ORAL

## 2018-03-15 NOTE — Telephone Encounter (Signed)
Appointments scheduled AVS/Calendar printed per 6/26 los °

## 2018-03-15 NOTE — Patient Instructions (Signed)
Blood Transfusion A blood transfusion is a procedure in which you are given blood through an IV tube. You may need this procedure because of:  Illness.  Surgery.  Injury.  The blood may come from someone else (a donor). You may also be able to donate blood for yourself (autologous blood donation). The blood given in a transfusion is made up of different types of cells. You may get:  Red blood cells. These carry oxygen to the cells in the body.  White blood cells. These help you fight infections.  Platelets. These help your blood to clot.  Plasma. This is the liquid part of your blood. It helps with fluid imbalances.  If you have a clotting disorder, you may also get other types of blood products. What happens before the procedure?  You will have a blood test to find out your blood type. The test also finds out what type of blood your body will accept and matches it to the donor type.  If you are going to have a planned surgery, you may be able to donate your own blood. This may be done in case you need a transfusion.  If you have had an allergic reaction to a transfusion in the past, you may be given medicine to help prevent a reaction. This medicine may be given to you by mouth or through an IV.  You will have your temperature, blood pressure, and pulse checked.  Follow instructions from your doctor about what you cannot eat or drink.  Ask your doctor about: ? Changing or stopping your regular medicines. This is important if you take diabetes medicines or blood thinners. ? Taking medicines such as aspirin and ibuprofen. These medicines can thin your blood. Do not take these medicines before your procedure if your doctor tells you not to. What happens during the procedure?  An IV tube will be put into one of your veins.  The bag of donated blood will be attached to your IV tube. Then, the blood will enter through your vein.  Your temperature, blood pressure, and pulse will be  checked regularly during the procedure. This is done to find early signs of a transfusion reaction.  If you have any signs or symptoms of a reaction, your transfusion will be stopped. You may also be given medicine.  When the transfusion is done, your IV tube will be taken out.  Pressure may be applied to the IV site for a few minutes.  A bandage (dressing) will be put on the IV site. The procedure may vary among doctors and hospitals. What happens after the procedure?  Your temperature, blood pressure, heart rate, breathing rate, and blood oxygen level will be checked often.  Your blood may be tested to see how you are responding to the transfusion.  You may be warmed with fluids or blankets. This is done to keep the temperature of your body normal. Summary  A blood transfusion is a procedure in which you are given blood through an IV tube.  The blood may come from someone else (a donor). You may also be able to donate blood for yourself.  If you have had an allergic reaction to a transfusion in the past, you may be given medicine to help prevent a reaction. This medicine may be given to you by mouth or through an IV tube.  Your temperature, blood pressure, heart rate, breathing rate, and blood oxygen level will be checked often.  Your blood may be tested to   see how you are responding to the transfusion. This information is not intended to replace advice given to you by your health care provider. Make sure you discuss any questions you have with your health care provider. Document Released: 12/03/2008 Document Revised: 04/30/2016 Document Reviewed: 04/30/2016 Elsevier Interactive Patient Education  2017 Elsevier Inc.  

## 2018-03-15 NOTE — Progress Notes (Signed)
VS taken at 15 minute into transfusion BP 117/32 ;  Lasix 20mg  given prior to start at 1640. Dr [name] notified. MD advised to continue with transfusion, pt has history of CHF, she is dehydrated. Noted.

## 2018-03-17 ENCOUNTER — Inpatient Hospital Stay: Payer: Commercial Managed Care - HMO

## 2018-03-17 ENCOUNTER — Other Ambulatory Visit: Payer: Self-pay

## 2018-03-17 VITALS — BP 142/42 | HR 61 | Temp 97.7°F | Resp 16

## 2018-03-17 DIAGNOSIS — D649 Anemia, unspecified: Secondary | ICD-10-CM

## 2018-03-17 DIAGNOSIS — D509 Iron deficiency anemia, unspecified: Secondary | ICD-10-CM | POA: Diagnosis not present

## 2018-03-17 DIAGNOSIS — K922 Gastrointestinal hemorrhage, unspecified: Secondary | ICD-10-CM | POA: Diagnosis not present

## 2018-03-17 DIAGNOSIS — I5022 Chronic systolic (congestive) heart failure: Secondary | ICD-10-CM | POA: Diagnosis not present

## 2018-03-17 DIAGNOSIS — D500 Iron deficiency anemia secondary to blood loss (chronic): Secondary | ICD-10-CM

## 2018-03-17 DIAGNOSIS — I110 Hypertensive heart disease with heart failure: Secondary | ICD-10-CM | POA: Diagnosis not present

## 2018-03-17 DIAGNOSIS — E119 Type 2 diabetes mellitus without complications: Secondary | ICD-10-CM | POA: Diagnosis not present

## 2018-03-17 DIAGNOSIS — E039 Hypothyroidism, unspecified: Secondary | ICD-10-CM | POA: Diagnosis not present

## 2018-03-17 LAB — PREPARE RBC (CROSSMATCH)

## 2018-03-17 MED ORDER — ACETAMINOPHEN 325 MG PO TABS
650.0000 mg | ORAL_TABLET | Freq: Once | ORAL | Status: AC
  Administered 2018-03-17: 650 mg via ORAL

## 2018-03-17 MED ORDER — ACETAMINOPHEN 325 MG PO TABS
ORAL_TABLET | ORAL | Status: AC
  Filled 2018-03-17: qty 2

## 2018-03-17 MED ORDER — DIPHENHYDRAMINE HCL 25 MG PO CAPS
ORAL_CAPSULE | ORAL | Status: AC
  Filled 2018-03-17: qty 1

## 2018-03-17 MED ORDER — DIPHENHYDRAMINE HCL 25 MG PO CAPS
25.0000 mg | ORAL_CAPSULE | Freq: Once | ORAL | Status: AC
  Administered 2018-03-17: 25 mg via ORAL

## 2018-03-17 MED ORDER — SODIUM CHLORIDE 0.9 % IV SOLN
750.0000 mg | Freq: Once | INTRAVENOUS | Status: AC
  Administered 2018-03-17: 750 mg via INTRAVENOUS
  Filled 2018-03-17: qty 15

## 2018-03-17 NOTE — Patient Instructions (Signed)
Blood Transfusion, Care After This sheet gives you information about how to care for yourself after your procedure. Your doctor may also give you more specific instructions. If you have problems or questions, contact your doctor. Follow these instructions at home:  Take over-the-counter and prescription medicines only as told by your doctor.  Go back to your normal activities as told by your doctor.  Follow instructions from your doctor about how to take care of the area where an IV tube was put into your vein (insertion site). Make sure you: ? Wash your hands with soap and water before you change your bandage (dressing). If there is no soap and water, use hand sanitizer. ? Change your bandage as told by your doctor.  Check your IV insertion site every day for signs of infection. Check for: ? More redness, swelling, or pain. ? More fluid or blood. ? Warmth. ? Pus or a bad smell. Contact a doctor if:  You have more redness, swelling, or pain around the IV insertion site..  You have more fluid or blood coming from the IV insertion site.  Your IV insertion site feels warm to the touch.  You have pus or a bad smell coming from the IV insertion site.  Your pee (urine) turns pink, red, or brown.  You feel weak after doing your normal activities. Get help right away if:  You have signs of a serious allergic or body defense (immune) system reaction, including: ? Itchiness. ? Hives. ? Trouble breathing. ? Anxiety. ? Pain in your chest or lower back. ? Fever, flushing, and chills. ? Fast pulse. ? Rash. ? Watery poop (diarrhea). ? Throwing up (vomiting). ? Dark pee. ? Serious headache. ? Dizziness. ? Stiff neck. ? Yellow color in your face or the white parts of your eyes (jaundice). Summary  After a blood transfusion, return to your normal activities as told by your doctor.  Every day, check for signs of infection where the IV tube was put into your vein.  Some signs of  infection are warm skin, more redness and pain, more fluid or blood, and pus or a bad smell where the needle went in.  Contact your doctor if you feel weak or have any unusual symptoms. This information is not intended to replace advice given to you by your health care provider. Make sure you discuss any questions you have with your health care provider. Document Released: 09/27/2014 Document Revised: 04/30/2016 Document Reviewed: 04/30/2016 Elsevier Interactive Patient Education  2017 Elsevier Inc. Ferric carboxymaltose injection What is this medicine? FERRIC CARBOXYMALTOSE (ferr-ik car-box-ee-mol-toes) is an iron complex. Iron is used to make healthy red blood cells, which carry oxygen and nutrients throughout the body. This medicine is used to treat anemia in people with chronic kidney disease or people who cannot take iron by mouth. This medicine may be used for other purposes; ask your health care provider or pharmacist if you have questions. COMMON BRAND NAME(S): Injectafer What should I tell my health care provider before I take this medicine? They need to know if you have any of these conditions: -anemia not caused by low iron levels -high levels of iron in the blood -liver disease -an unusual or allergic reaction to iron, other medicines, foods, dyes, or preservatives -pregnant or trying to get pregnant -breast-feeding How should I use this medicine? This medicine is for infusion into a vein. It is given by a health care professional in a hospital or clinic setting. Talk to your pediatrician regarding  the use of this medicine in children. Special care may be needed. Overdosage: If you think you have taken too much of this medicine contact a poison control center or emergency room at once. NOTE: This medicine is only for you. Do not share this medicine with others. What if I miss a dose? It is important not to miss your dose. Call your doctor or health care professional if you are  unable to keep an appointment. What may interact with this medicine? Do not take this medicine with any of the following medications: -deferoxamine -dimercaprol -other iron products This medicine may also interact with the following medications: -chloramphenicol -deferasirox This list may not describe all possible interactions. Give your health care provider a list of all the medicines, herbs, non-prescription drugs, or dietary supplements you use. Also tell them if you smoke, drink alcohol, or use illegal drugs. Some items may interact with your medicine. What should I watch for while using this medicine? Visit your doctor or health care professional regularly. Tell your doctor if your symptoms do not start to get better or if they get worse. You may need blood work done while you are taking this medicine. You may need to follow a special diet. Talk to your doctor. Foods that contain iron include: whole grains/cereals, dried fruits, beans, or peas, leafy green vegetables, and organ meats (liver, kidney). What side effects may I notice from receiving this medicine? Side effects that you should report to your doctor or health care professional as soon as possible: -allergic reactions like skin rash, itching or hives, swelling of the face, lips, or tongue -breathing problems -changes in blood pressure -feeling faint or lightheaded, falls -flushing, sweating, or hot feelings Side effects that usually do not require medical attention (report to your doctor or health care professional if they continue or are bothersome): -changes in taste -constipation -dizziness -headache -nausea -pain, redness, or irritation at site where injected -vomiting This list may not describe all possible side effects. Call your doctor for medical advice about side effects. You may report side effects to FDA at 1-800-FDA-1088. Where should I keep my medicine? This drug is given in a hospital or clinic and will not  be stored at home. NOTE: This sheet is a summary. It may not cover all possible information. If you have questions about this medicine, talk to your doctor, pharmacist, or health care provider.  2018 Elsevier/Gold Standard (2015-10-09 11:20:47)

## 2018-03-18 LAB — BPAM RBC
BLOOD PRODUCT EXPIRATION DATE: 201907262359
ISSUE DATE / TIME: 201906281617
Unit Type and Rh: 6200

## 2018-03-18 LAB — TYPE AND SCREEN
ABO/RH(D): A POS
Antibody Screen: NEGATIVE
Unit division: 0

## 2018-03-19 ENCOUNTER — Other Ambulatory Visit: Payer: Self-pay | Admitting: Family Medicine

## 2018-03-20 ENCOUNTER — Telehealth: Payer: Self-pay

## 2018-03-20 LAB — BPAM RBC
BLOOD PRODUCT EXPIRATION DATE: 201907232359
Blood Product Expiration Date: 201907232359
ISSUE DATE / TIME: 201906261623
UNIT TYPE AND RH: 6200
Unit Type and Rh: 6200

## 2018-03-20 LAB — TYPE AND SCREEN
ABO/RH(D): A POS
ANTIBODY SCREEN: NEGATIVE
Unit division: 0
Unit division: 0

## 2018-03-20 NOTE — Telephone Encounter (Signed)
Per Dr. [name] patient made aware "B12 levels low --- take OTC B12 1038mcg and continue her B complex 1 cap po daily." Patient verbalized understanding.

## 2018-03-21 DIAGNOSIS — H02132 Senile ectropion of right lower eyelid: Secondary | ICD-10-CM | POA: Diagnosis not present

## 2018-03-21 DIAGNOSIS — H02135 Senile ectropion of left lower eyelid: Secondary | ICD-10-CM | POA: Diagnosis not present

## 2018-03-21 DIAGNOSIS — Z01818 Encounter for other preprocedural examination: Secondary | ICD-10-CM | POA: Diagnosis not present

## 2018-03-25 ENCOUNTER — Ambulatory Visit: Payer: Medicare HMO

## 2018-03-27 ENCOUNTER — Inpatient Hospital Stay: Payer: Commercial Managed Care - HMO | Attending: Hematology | Admitting: *Deleted

## 2018-03-27 VITALS — BP 117/37 | HR 61 | Temp 98.3°F | Resp 16

## 2018-03-27 DIAGNOSIS — D500 Iron deficiency anemia secondary to blood loss (chronic): Secondary | ICD-10-CM | POA: Insufficient documentation

## 2018-03-27 DIAGNOSIS — K31811 Angiodysplasia of stomach and duodenum with bleeding: Secondary | ICD-10-CM | POA: Insufficient documentation

## 2018-03-27 MED ORDER — SODIUM CHLORIDE 0.9 % IV SOLN
750.0000 mg | Freq: Once | INTRAVENOUS | Status: AC
  Administered 2018-03-27: 750 mg via INTRAVENOUS
  Filled 2018-03-27: qty 15

## 2018-03-27 MED ORDER — DIPHENHYDRAMINE HCL 25 MG PO CAPS
25.0000 mg | ORAL_CAPSULE | Freq: Once | ORAL | Status: AC
  Administered 2018-03-27: 25 mg via ORAL

## 2018-03-27 MED ORDER — ACETAMINOPHEN 325 MG PO TABS
ORAL_TABLET | ORAL | Status: AC
  Filled 2018-03-27: qty 2

## 2018-03-27 MED ORDER — DIPHENHYDRAMINE HCL 25 MG PO CAPS
ORAL_CAPSULE | ORAL | Status: AC
  Filled 2018-03-27: qty 1

## 2018-03-27 MED ORDER — ACETAMINOPHEN 325 MG PO TABS
650.0000 mg | ORAL_TABLET | Freq: Once | ORAL | Status: AC
  Administered 2018-03-27: 650 mg via ORAL

## 2018-03-27 NOTE — Patient Instructions (Signed)
Ferric carboxymaltose injection What is this medicine? FERRIC CARBOXYMALTOSE (ferr-ik car-box-ee-mol-toes) is an iron complex. Iron is used to make healthy red blood cells, which carry oxygen and nutrients throughout the body. This medicine is used to treat anemia in people with chronic kidney disease or people who cannot take iron by mouth. This medicine may be used for other purposes; ask your health care provider or pharmacist if you have questions. COMMON BRAND NAME(S): Injectafer What should I tell my health care provider before I take this medicine? They need to know if you have any of these conditions: -anemia not caused by low iron levels -high levels of iron in the blood -liver disease -an unusual or allergic reaction to iron, other medicines, foods, dyes, or preservatives -pregnant or trying to get pregnant -breast-feeding How should I use this medicine? This medicine is for infusion into a vein. It is given by a health care professional in a hospital or clinic setting. Talk to your pediatrician regarding the use of this medicine in children. Special care may be needed. Overdosage: If you think you have taken too much of this medicine contact a poison control center or emergency room at once. NOTE: This medicine is only for you. Do not share this medicine with others. What if I miss a dose? It is important not to miss your dose. Call your doctor or health care professional if you are unable to keep an appointment. What may interact with this medicine? Do not take this medicine with any of the following medications: -deferoxamine -dimercaprol -other iron products This medicine may also interact with the following medications: -chloramphenicol -deferasirox This list may not describe all possible interactions. Give your health care provider a list of all the medicines, herbs, non-prescription drugs, or dietary supplements you use. Also tell them if you smoke, drink alcohol, or use  illegal drugs. Some items may interact with your medicine. What should I watch for while using this medicine? Visit your doctor or health care professional regularly. Tell your doctor if your symptoms do not start to get better or if they get worse. You may need blood work done while you are taking this medicine. You may need to follow a special diet. Talk to your doctor. Foods that contain iron include: whole grains/cereals, dried fruits, beans, or peas, leafy green vegetables, and organ meats (liver, kidney). What side effects may I notice from receiving this medicine? Side effects that you should report to your doctor or health care professional as soon as possible: -allergic reactions like skin rash, itching or hives, swelling of the face, lips, or tongue -breathing problems -changes in blood pressure -feeling faint or lightheaded, falls -flushing, sweating, or hot feelings Side effects that usually do not require medical attention (report to your doctor or health care professional if they continue or are bothersome): -changes in taste -constipation -dizziness -headache -nausea -pain, redness, or irritation at site where injected -vomiting This list may not describe all possible side effects. Call your doctor for medical advice about side effects. You may report side effects to FDA at 1-800-FDA-1088. Where should I keep my medicine? This drug is given in a hospital or clinic and will not be stored at home. NOTE: This sheet is a summary. It may not cover all possible information. If you have questions about this medicine, talk to your doctor, pharmacist, or health care provider.  2018 Elsevier/Gold Standard (2015-10-09 11:20:47)  

## 2018-04-19 DIAGNOSIS — H02103 Unspecified ectropion of right eye, unspecified eyelid: Secondary | ICD-10-CM | POA: Diagnosis not present

## 2018-04-19 DIAGNOSIS — H02135 Senile ectropion of left lower eyelid: Secondary | ICD-10-CM | POA: Diagnosis not present

## 2018-04-19 DIAGNOSIS — H02132 Senile ectropion of right lower eyelid: Secondary | ICD-10-CM | POA: Diagnosis not present

## 2018-04-19 DIAGNOSIS — H02106 Unspecified ectropion of left eye, unspecified eyelid: Secondary | ICD-10-CM | POA: Diagnosis not present

## 2018-04-19 NOTE — Telephone Encounter (Signed)
Letter received from [facility] stating that they have approved the pt for pt asst with [employer]. Approval good until 09/19/2018. Pt ID: [id number]

## 2018-04-27 ENCOUNTER — Ambulatory Visit (INDEPENDENT_AMBULATORY_CARE_PROVIDER_SITE_OTHER): Payer: Medicare HMO | Admitting: Family Medicine

## 2018-04-27 ENCOUNTER — Ambulatory Visit
Admission: RE | Admit: 2018-04-27 | Discharge: 2018-04-27 | Disposition: A | Discharge status: Home / Self Care | Payer: Medicare HMO | Source: Ambulatory Visit | Attending: Family Medicine | Admitting: Family Medicine

## 2018-04-27 VITALS — BP 150/68 | HR 58 | Temp 98.0°F

## 2018-04-27 DIAGNOSIS — S79912A Unspecified injury of left hip, initial encounter: Secondary | ICD-10-CM | POA: Diagnosis not present

## 2018-04-27 DIAGNOSIS — M545 Low back pain, unspecified: Secondary | ICD-10-CM

## 2018-04-27 DIAGNOSIS — S79911A Unspecified injury of right hip, initial encounter: Secondary | ICD-10-CM | POA: Diagnosis not present

## 2018-04-27 DIAGNOSIS — M25552 Pain in left hip: Secondary | ICD-10-CM | POA: Diagnosis not present

## 2018-04-27 DIAGNOSIS — M25551 Pain in right hip: Secondary | ICD-10-CM | POA: Diagnosis not present

## 2018-04-27 MED ORDER — HYDROCODONE-ACETAMINOPHEN 5-325 MG PO TABS: 1.0000 | ORAL_TABLET | Freq: Four times a day (QID) | ORAL | 0 refills | Status: DC | PRN

## 2018-04-27 NOTE — Progress Notes (Signed)
   Subjective:    Patient ID: Connie [name], female    DOB: [birth year], 84 y.o.   MRN: [id number]  HPI She complains of a 4-day history of right SI joint discomfort.  No history of recent injury.  She did have a left pubic ramus fracture in May of this year this is apparently quieted down.  No other joint pain noted.   Review of Systems     Objective:   Physical Exam Alert and complaining of pain.  Tender to palpation in the right SI joint area.  No abdominal discomfort.  No pain over the ischial tuberosity or pubic ramus.       Assessment & Plan:  Acute right-sided low back pain without sciatica - Plan: HYDROcodone-acetaminophen (NORCO) 5-325 MG tablet, DG HIPS BILAT WITH PELVIS 3-4 VIEWS, CANCELED: DG HIPS BILAT WITH PELVIS 2V

## 2018-04-28 ENCOUNTER — Telehealth: Payer: Self-pay | Admitting: Family Medicine

## 2018-04-28 NOTE — Telephone Encounter (Signed)
Have her take the pain medication through the weekend and is still having difficulty then schedule a recheck and we will need to pursue her bone pain more aggressively.

## 2018-04-28 NOTE — Telephone Encounter (Signed)
Connie [name] called and I read xray result notes to him He states that she is still in pain and wants to know how long they should wait  Please call

## 2018-04-28 NOTE — Telephone Encounter (Signed)
Pt is aware. KH 

## 2018-05-05 ENCOUNTER — Other Ambulatory Visit: Payer: Self-pay | Admitting: Family Medicine

## 2018-05-09 ENCOUNTER — Telehealth: Payer: Self-pay | Admitting: Family Medicine

## 2018-05-09 ENCOUNTER — Other Ambulatory Visit: Payer: Self-pay | Admitting: Family Medicine

## 2018-05-09 DIAGNOSIS — M545 Low back pain, unspecified: Secondary | ICD-10-CM

## 2018-05-09 MED ORDER — HYDROCODONE-ACETAMINOPHEN 5-325 MG PO TABS: 1.0000 | ORAL_TABLET | Freq: Four times a day (QID) | ORAL | 0 refills | Status: DC | PRN

## 2018-05-09 NOTE — Telephone Encounter (Signed)
Set her up for a bone scan

## 2018-05-09 NOTE — Telephone Encounter (Signed)
Pt son called and states that Connie [name] is still having hip pain would like a refill on her norco, states the pain medicice helps a little, states the pain has not really got any better since the last time she was here, pt uses [id number] - [place], [place] - [address] and pt can be reached at [phone]

## 2018-05-09 NOTE — Progress Notes (Signed)
Connie Kitchen    HEMATOLOGY/ONCOLOGY CLINIC NOTE  Date of Service: 05/10/18    Patient Care Team: [name], MD as PCP - General (Family Medicine)  CHIEF COMPLAINTS/PURPOSE OF CONSULTATION:  Iron deficiency anemia due to GI bleeding from multiple AVMs  DIAGNOSIS: Iron deficiency anemia likely related to ongoing chronic GI bleeding from multiple AVMs  TREATMENT -IV iron replacement with injectafer as needed (tolerated well). (Had some shortness of breath with IV Feraheme - which was likely fluid overload as opposed to a true allergic reaction but patient has been hesitant to take this)  HISTORY OF PRESENTING ILLNESS:  Please see previous note for details  INTERVAL HISTORY  Connie [name] is here for followup for her IDA. The patient's last visit with [place] was on 03/15/18. She is accompanied today by her son. The pt reports that she is doing well overall.   The pt reports that she tolerated her IV Iron infusion 6 weeks ago. The pt notes that she hasn't had any GI work up in the interim.. She notes that she feels fairly fatigued. She notes that she was taking Aleve for her hip pain recently. The pt also notes that her appetite has weakened and she hasn't been eating as much.   The pt notes that she does not see very well and is unsure if her stools are black or have blood in them.   Lab results today (05/10/18) of CBC w/diff, CMP, and Reticulocytes is as follows: all values are WNL except for RBC at 2.59, HGB at 7.5, HCT at 24.3, MCHC at 30.9, RDW at 15.7, Lymphs abs at 600, Monocytes abs at 1.0k, Glucose at 101, BUN at 37, Creatinine at 1.11, Calcium at 8.5, Total Protein at 6.3, Albumin at 3.4, Alk Phos at 167, GFR at 45. Iron/TIBC 05/10/18 reveals Iron at 29, TIBC at 201, and a 14% Saturation ratio.   On review of systems, pt reports some fatigue, stable leg swelling, and denies overt blood in the stools, light headedness, dizziness, abdominal pains, and any other symptoms.   MEDICAL HISTORY:    Past Medical History:  Diagnosis Date  . Allergy    RHINITIS  . Angiodysplasia of duodenum    hx/notes 11/21/2015  . Angiodysplasia of stomach    hx/notes 11/21/2015  . Arthritis    "joints ache"  . Bleeding gastric ulcer    hx/notes 11/21/2015  . Chronic blood loss anemia    secondary to angiodysplasia of the stomach and duodenum as well as history of bleeding gastric ulcer hx/notes 11/21/2015  . Chronic systolic CHF (congestive heart failure) ([place]) 12/23/2015  . History of blood transfusion 01/2015; 06/2015; 11/21/2015  . Hypercholesteremia   . Hypertension   . Hypothyroidism   . Obesity   . Pneumonia "several times"  . Type II diabetes mellitus ([place])     SURGICAL HISTORY: Past Surgical History:  Procedure Laterality Date  . CARDIAC CATHETERIZATION N/A 12/19/2015   Procedure: Left Heart Cath and Coronary Angiography;  Surgeon: Leonie Man, MD;  Location: [facility];  Service: Cardiovascular;  Laterality: N/A;  . COLONOSCOPY N/A 08/30/2014   Procedure: COLONOSCOPY;  Surgeon: Beryle Beams, MD;  Location: [facility];  Service: Endoscopy;  Laterality: N/A;  . COLONOSCOPY N/A 02/21/2015   Procedure: COLONOSCOPY;  Surgeon: Carol Ada, MD;  Location: [facility];  Service: Endoscopy;  Laterality: N/A;  . ENTEROSCOPY N/A 06/06/2015   Procedure: ENTEROSCOPY;  Surgeon: Carol Ada, MD;  Location: [facility];  Service: Endoscopy;  Laterality: N/A;  . FRACTURE SURGERY    . GIVENS CAPSULE STUDY N/A 05/15/2015   Procedure: GIVENS CAPSULE STUDY;  Surgeon: Carol Ada, MD;  Location: [facility] ENDOSCOPY;  Service: Endoscopy;  Laterality: N/A;  . HOT HEMOSTASIS N/A 08/30/2014   Procedure: HOT HEMOSTASIS (ARGON PLASMA COAGULATION/BICAP);  Surgeon: Beryle Beams, MD;  Location: [facility];  Service: Endoscopy;  Laterality: N/A;  . HOT HEMOSTASIS N/A 06/06/2015   Procedure: HOT HEMOSTASIS (ARGON PLASMA COAGULATION/BICAP);  Surgeon: Carol Ada, MD;  Location: [facility];  Service:  Endoscopy;  Laterality: N/A;  . JOINT REPLACEMENT    . REVISION TOTAL KNEE ARTHROPLASTY Bilateral 2004-2015  . SHOULDER OPEN ROTATOR CUFF REPAIR Right 12/2004   open subacromial decompression, distal clavicle  resection, rotator cuff repair/notes 02/02/2011  . TONSILLECTOMY  1940s  . TOTAL KNEE ARTHROPLASTY Bilateral ~ 2002  . TOTAL THYROIDECTOMY  ~ 1966    SOCIAL HISTORY: Social History   Socioeconomic History  . Marital status: Married    Spouse name: Not on file  . Number of children: Not on file  . Years of education: Not on file  . Highest education level: Not on file  Occupational History  . Not on file  Social Needs  . Financial resource strain: Not on file  . Food insecurity:    Worry: Not on file    Inability: Not on file  . Transportation needs:    Medical: Not on file    Non-medical: Not on file  Tobacco Use  . Smoking status: Never Smoker  . Smokeless tobacco: Never Used  Substance and Sexual Activity  . Alcohol use: No  . Drug use: No  . Sexual activity: Not Currently  Lifestyle  . Physical activity:    Days per week: Not on file    Minutes per session: Not on file  . Stress: Not on file  Relationships  . Social connections:    Talks on phone: Not on file    Gets together: Not on file    Attends religious service: Not on file    Active member of club or organization: Not on file    Attends meetings of clubs or organizations: Not on file    Relationship status: Not on file  . Intimate partner violence:    Fear of current or ex partner: Not on file    Emotionally abused: Not on file    Physically abused: Not on file    Forced sexual activity: Not on file  Other Topics Concern  . Not on file  Social History Narrative  . Not on file    FAMILY HISTORY: Family History  Problem Relation Age of Onset  . Asthma Mother   . Ulcers Father   . Anemia Sister     ALLERGIES:  has No Known Allergies.  MEDICATIONS:  Current Outpatient Medications    Medication Sig Dispense Refill  . ACCU-CHEK FASTCLIX LANCETS MISC 1 each by Does not apply route 2 (two) times daily. DX E11.9 204 each 3  . acetaminophen (TYLENOL) 500 MG tablet Take 500 mg by mouth every 6 (six) hours as needed.    Connie Kitchen amLODipine (NORVASC) 10 MG tablet TAKE 1 TABLET (10 MG TOTAL) BY MOUTH DAILY. 90 tablet 1  . amLODipine (NORVASC) 10 MG tablet TAKE 1 TABLET BY MOUTH DAILY 90 tablet 1  . atorvastatin (LIPITOR) 40 MG tablet TAKE 1 TABLET EVERY DAY 90 tablet 3  . Blood Glucose Monitoring Suppl (ACCU-CHEK GUIDE) w/Device KIT 1 each by  Does not apply route 2 (two) times daily. DX E11.9 1 kit 0  . carvedilol (COREG) 12.5 MG tablet TAKE 1 TABLET (12.5 MG TOTAL) BY MOUTH 2 (TWO) TIMES DAILY WITH A MEAL. 180 tablet 1  . furosemide (LASIX) 40 MG tablet TAKE 1 TABLET (40 MG TOTAL) BY MOUTH DAILY. 90 tablet 1  . glucose blood (ACCU-CHEK GUIDE) test strip Patient to test 2 times daily DX E11.9 Use as instructed 200 each 3  . HYDROcodone-acetaminophen (NORCO) 5-325 MG tablet Take 1 tablet by mouth every 6 (six) hours as needed for moderate pain. 30 tablet 0  . Lancets Misc. (ACCU-CHEK FASTCLIX LANCET) KIT 1 each by Does not apply route 2 (two) times daily. DX E11.9 1 kit 0  . levothyroxine (SYNTHROID, LEVOTHROID) 125 MCG tablet TAKE 1 TABLET EVERY DAY 90 tablet 3  . Multiple Vitamins-Minerals (WOMENS MULTIVITAMIN PLUS PO) Take 1 tablet by mouth daily.    . pantoprazole (PROTONIX) 20 MG tablet TAKE 1 TABLET (20 MG TOTAL) BY MOUTH DAILY. (Patient not taking: Reported on 04/27/2018) 90 tablet 3  . pioglitazone (ACTOS) 30 MG tablet TAKE 1 TABLET (30 MG TOTAL) BY MOUTH DAILY. 90 tablet 1  . potassium chloride (K-DUR) 10 MEQ tablet Take 1 tablet (10 mEq total) by mouth as directed. Take 10 meq twice a day every Mon, Wed and Fri's    . potassium chloride (K-DUR,KLOR-CON) 10 MEQ tablet TAKE 1 TABLET TWICE DAILY 180 tablet 1  . sacubitril-valsartan (ENTRESTO) 49-51 MG Take 1 tablet by mouth 2 (two) times  daily. 180 tablet 3   No current facility-administered medications for this visit.     REVIEW OF SYSTEMS:   A 10+ POINT REVIEW OF SYSTEMS WAS OBTAINED including neurology, dermatology, psychiatry, cardiac, respiratory, lymph, extremities, GI, GU, Musculoskeletal, constitutional, breasts, reproductive, HEENT.  All pertinent positives are noted in the HPI.  All others are negative.   PHYSICAL EXAMINATION:  ECOG PERFORMANCE STATUS: 1 - Symptomatic but completely ambulatory  Vitals:   05/10/18 1410  BP: (!) 158/41  Pulse: 63  Resp: 18  Temp: 97.7 F (36.5 C)  SpO2: 100%   Filed Weights   05/10/18 1410  Weight: 114 lb 1.6 oz (51.8 kg)   .Body mass index is 25.58 kg/m.  GENERAL:alert, in no acute distress and comfortable SKIN: no acute rashes, no significant lesions EYES: conjunctiva are pink and non-injected, sclera anicteric OROPHARYNX: MMM, no exudates, no oropharyngeal erythema or ulceration NECK: supple, no JVD LYMPH:  no palpable lymphadenopathy in the cervical, axillary or inguinal regions LUNGS: clear to auscultation b/l with normal respiratory effort HEART: regular rate & rhythm ABDOMEN:  normoactive bowel sounds , non tender, not distended. No palpable hepatosplenomegaly.  Extremity: no pedal edema PSYCH: alert & oriented x 3 with fluent speech NEURO: no focal motor/sensory deficits   LABORATORY DATA:    CBC Latest Ref Rng & Units 05/10/2018 03/15/2018 01/18/2018  WBC 3.9 - 10.3 K/uL 8.0 7.0 4.2  Hemoglobin 11.6 - 15.9 g/dL 7.5(L) 6.9(LL) 8.8(L)  Hematocrit 34.8 - 46.6 % 24.3(L) 23.2(L) 28.7(L)  Platelets 145 - 400 K/uL 381 371 229    . CBC    Component Value Date/Time   WBC 8.0 05/10/2018 1329   RBC 2.59 (L) 05/10/2018 1329   HGB 7.5 (L) 05/10/2018 1329   HGB 6.9 (LL) 03/15/2018 1143   HGB 9.1 (L) 10/19/2017 1149   HGB 10.8 (L) 06/20/2017 1326   HCT 24.3 (L) 05/10/2018 1329   HCT 28.3 (L) 10/19/2017 1149  HCT 34.9 06/20/2017 1326   PLT 381 05/10/2018  1329   PLT 371 03/15/2018 1143   PLT 260 10/19/2017 1149   MCV 93.8 05/10/2018 1329   MCV 90 10/19/2017 1149   MCV 94.8 06/20/2017 1326   MCH 29.0 05/10/2018 1329   MCHC 30.9 (L) 05/10/2018 1329   RDW 15.7 (H) 05/10/2018 1329   RDW 14.6 10/19/2017 1149   RDW 14.2 06/20/2017 1326   LYMPHSABS 0.6 (L) 05/10/2018 1329   LYMPHSABS 0.5 (L) 10/19/2017 1149   LYMPHSABS 0.6 (L) 06/20/2017 1326   MONOABS 1.0 (H) 05/10/2018 1329   MONOABS 0.7 06/20/2017 1326   EOSABS 0.1 05/10/2018 1329   EOSABS 0.2 10/19/2017 1149   BASOSABS 0.0 05/10/2018 1329   BASOSABS 0.0 10/19/2017 1149   BASOSABS 0.0 06/20/2017 1326    . CMP Latest Ref Rng & Units 05/10/2018 03/15/2018 01/18/2018  Glucose 70 - 99 mg/dL 101(H) 126(H) 102  BUN 8 - 23 mg/dL 37(H) 34(H) 29(H)  Creatinine 0.44 - 1.00 mg/dL 1.11(H) 1.24(H) 1.16(H)  Sodium 135 - 145 mmol/L 144 142 141  Potassium 3.5 - 5.1 mmol/L 3.5 4.2 4.7  Chloride 98 - 111 mmol/L 106 106 112(H)  CO2 22 - 32 mmol/L _0 Calcium 8.9 - 10.3 mg/dL 8.5(L) 8.7(L) 8.7  Total Protein 6.5 - 8.1 g/dL 6.3(L) 6.2(L) 6.4  Total Bilirubin 0.3 - 1.2 mg/dL 0.4 0.4 0.4  Alkaline Phos 38 - 126 U/L 167(H) 83 79  AST 15 - 41 U/L 15 11(L) 14  ALT 0 - 44 U/L <6 <6 <6   . Lab Results  Component Value Date   IRON 29 (L) 05/10/2018   TIBC 201 (L) 05/10/2018   IRONPCTSAT 14 (L) 05/10/2018   (Iron and TIBC)  Lab Results  Component Value Date   FERRITIN 696 (H) 05/10/2018       ASSESSMENT & PLAN:   84 y.o. Caucasian female with  #1 Microcytic anemia likely related to iron deficiency from chronic GI bleeding. No known disorder causing chronic inflammation at this time.  #2 history of recurrent GI bleeding related to upper and lower GI tract AVMs and gastric erosions. Has had several admissions with requirement of multiple PRBC transfusions. Dropping ferritin suggests ongoing slow GI losses. Hemoglobin down from 10.8 to 9.4. No clinically overt GI bleeding noted.  #3  Hypothyroidism on levothyroxine .  #4 ?? Reaction to [facility] - current hospital admission that shortness of breath appears to be more likely related to CHF decompensation than allergic reaction to Feraheme. However patient is very anxious and hesitant to get this preparation. Switched to and tolerated Injectafer PLAN:  -Primary concern that the pt continues to lose blood from the GI track -Recommend taking a B complex vitamin.  -take B12 SL 1070mg -Would like to keep Ferritin >100. At goal today after recently IV Iron. -Recommend re-checking thyroid levels with PCP and taking her Levothyroxine alone, an hour to two hours before breakfast and without milk to increase absorption. -Discussed pt labwork today, 05/10/18; 14% Iron saturation, HGB at 7.5, Ferritin at 696 -Will collect stool study  -Will order 2 units of PRBCs, continue Lasix, especially before transfusions -Will order IV Injectafer in 2-3 weeks -Recommended that the pt return to care with her GI Dr. HBenson Norwayfor her continued GI blood loss -Absolutely no NSAIDs, only Tylenol for mild pain     PRBC transfusion 2 units tomorrow IV Injectafer in 2 weeks and then monthly x 3 Labs in 2 weeks  then monthly x 3 RTC with Dr [name] in 6 weeks with labs with 2nd dose of Injectafer   All of the patients questions were answered with apparent satisfaction. The patient knows to call the clinic with any problems, questions or concerns.  The total time spent in the appt was 25 minutes and more than 50% was on counseling and direct patient cares.      Sullivan Lone MD Connie AAHIVMS [facility] Hematology/Oncology Physician [facility]  (Office):       [phone] (Work cell):  [phone] (Fax):           [phone]  I, Baldwin [place], am acting as a scribe for Dr. [name]  .I have reviewed the above documentation for accuracy and completeness, and I agree with the above. Brunetta Genera MD

## 2018-05-10 ENCOUNTER — Inpatient Hospital Stay: Payer: Medicare HMO | Admitting: Hematology

## 2018-05-10 ENCOUNTER — Ambulatory Visit (HOSPITAL_COMMUNITY): Payer: Medicare HMO

## 2018-05-10 ENCOUNTER — Telehealth: Payer: Self-pay | Admitting: Hematology

## 2018-05-10 ENCOUNTER — Inpatient Hospital Stay: Payer: Medicare HMO | Attending: Hematology

## 2018-05-10 ENCOUNTER — Ambulatory Visit (HOSPITAL_COMMUNITY)
Admission: RE | Admit: 2018-05-10 | Discharge: 2018-05-10 | Disposition: A | Discharge status: Home / Self Care | Payer: Medicare HMO | Source: Ambulatory Visit | Attending: Medical | Admitting: Medical

## 2018-05-10 ENCOUNTER — Other Ambulatory Visit: Payer: Self-pay

## 2018-05-10 ENCOUNTER — Other Ambulatory Visit: Payer: Self-pay | Admitting: Medical

## 2018-05-10 ENCOUNTER — Telehealth: Payer: Self-pay | Admitting: Family Medicine

## 2018-05-10 VITALS — BP 158/41 | HR 63 | Temp 97.7°F | Resp 18 | Ht <= 58 in | Wt 114.1 lb

## 2018-05-10 DIAGNOSIS — D509 Iron deficiency anemia, unspecified: Secondary | ICD-10-CM | POA: Diagnosis not present

## 2018-05-10 DIAGNOSIS — I5022 Chronic systolic (congestive) heart failure: Secondary | ICD-10-CM | POA: Insufficient documentation

## 2018-05-10 DIAGNOSIS — M4807 Spinal stenosis, lumbosacral region: Secondary | ICD-10-CM | POA: Insufficient documentation

## 2018-05-10 DIAGNOSIS — Z79899 Other long term (current) drug therapy: Secondary | ICD-10-CM | POA: Insufficient documentation

## 2018-05-10 DIAGNOSIS — S32592A Other specified fracture of left pubis, initial encounter for closed fracture: Secondary | ICD-10-CM | POA: Diagnosis not present

## 2018-05-10 DIAGNOSIS — M549 Dorsalgia, unspecified: Secondary | ICD-10-CM | POA: Insufficient documentation

## 2018-05-10 DIAGNOSIS — E669 Obesity, unspecified: Secondary | ICD-10-CM

## 2018-05-10 DIAGNOSIS — M419 Scoliosis, unspecified: Secondary | ICD-10-CM | POA: Insufficient documentation

## 2018-05-10 DIAGNOSIS — E039 Hypothyroidism, unspecified: Secondary | ICD-10-CM | POA: Insufficient documentation

## 2018-05-10 DIAGNOSIS — M48061 Spinal stenosis, lumbar region without neurogenic claudication: Secondary | ICD-10-CM | POA: Diagnosis not present

## 2018-05-10 DIAGNOSIS — M25551 Pain in right hip: Secondary | ICD-10-CM | POA: Insufficient documentation

## 2018-05-10 DIAGNOSIS — E119 Type 2 diabetes mellitus without complications: Secondary | ICD-10-CM | POA: Insufficient documentation

## 2018-05-10 DIAGNOSIS — D500 Iron deficiency anemia secondary to blood loss (chronic): Secondary | ICD-10-CM

## 2018-05-10 DIAGNOSIS — I10 Essential (primary) hypertension: Secondary | ICD-10-CM

## 2018-05-10 DIAGNOSIS — R102 Pelvic and perineal pain: Secondary | ICD-10-CM | POA: Insufficient documentation

## 2018-05-10 DIAGNOSIS — S3210XD Unspecified fracture of sacrum, subsequent encounter for fracture with routine healing: Secondary | ICD-10-CM | POA: Diagnosis not present

## 2018-05-10 DIAGNOSIS — R0602 Shortness of breath: Secondary | ICD-10-CM

## 2018-05-10 DIAGNOSIS — M25552 Pain in left hip: Secondary | ICD-10-CM | POA: Insufficient documentation

## 2018-05-10 DIAGNOSIS — I110 Hypertensive heart disease with heart failure: Secondary | ICD-10-CM | POA: Diagnosis not present

## 2018-05-10 DIAGNOSIS — X58XXXA Exposure to other specified factors, initial encounter: Secondary | ICD-10-CM | POA: Diagnosis not present

## 2018-05-10 DIAGNOSIS — S3210XA Unspecified fracture of sacrum, initial encounter for closed fracture: Secondary | ICD-10-CM | POA: Diagnosis not present

## 2018-05-10 DIAGNOSIS — M47816 Spondylosis without myelopathy or radiculopathy, lumbar region: Secondary | ICD-10-CM | POA: Diagnosis not present

## 2018-05-10 DIAGNOSIS — K31811 Angiodysplasia of stomach and duodenum with bleeding: Secondary | ICD-10-CM

## 2018-05-10 DIAGNOSIS — D649 Anemia, unspecified: Secondary | ICD-10-CM

## 2018-05-10 LAB — CBC WITH DIFFERENTIAL/PLATELET
BASOS ABS: 0 10*3/uL (ref 0.0–0.1)
Basophils Relative: 0 %
Eosinophils Absolute: 0.1 10*3/uL (ref 0.0–0.5)
Eosinophils Relative: 2 %
HEMATOCRIT: 24.3 % — AB (ref 34.8–46.6)
Hemoglobin: 7.5 g/dL — ABNORMAL LOW (ref 11.6–15.9)
LYMPHS PCT: 7 %
Lymphs Abs: 0.6 10*3/uL — ABNORMAL LOW (ref 0.9–3.3)
MCH: 29 pg (ref 25.1–34.0)
MCHC: 30.9 g/dL — ABNORMAL LOW (ref 31.5–36.0)
MCV: 93.8 fL (ref 79.5–101.0)
MONO ABS: 1 10*3/uL — AB (ref 0.1–0.9)
MONOS PCT: 13 %
NEUTROS ABS: 6.3 10*3/uL (ref 1.5–6.5)
Neutrophils Relative %: 78 %
Platelets: 381 10*3/uL (ref 145–400)
RBC: 2.59 MIL/uL — ABNORMAL LOW (ref 3.70–5.45)
RDW: 15.7 % — AB (ref 11.2–14.5)
WBC: 8 10*3/uL (ref 3.9–10.3)

## 2018-05-10 LAB — FERRITIN: Ferritin: 696 ng/mL — ABNORMAL HIGH (ref 11–307)

## 2018-05-10 LAB — CMP (CANCER CENTER ONLY)
ALBUMIN: 3.4 g/dL — AB (ref 3.5–5.0)
ALK PHOS: 167 U/L — AB (ref 38–126)
ALT: <6 U/L (ref 0–44)
AST: 15 U/L (ref 15–41)
Anion gap: 11 (ref 5–15)
BUN: 37 mg/dL — AB (ref 8–23)
CO2: 27 mmol/L (ref 22–32)
Calcium: 8.5 mg/dL — ABNORMAL LOW (ref 8.9–10.3)
Chloride: 106 mmol/L (ref 98–111)
Creatinine: 1.11 mg/dL — ABNORMAL HIGH (ref 0.44–1.00)
GFR, Est AFR Am: 52 mL/min — ABNORMAL LOW (ref >60)
GFR, Estimated: 45 mL/min — ABNORMAL LOW (ref >60)
GLUCOSE: 101 mg/dL — AB (ref 70–99)
Potassium: 3.5 mmol/L (ref 3.5–5.1)
Sodium: 144 mmol/L (ref 135–145)
Total Bilirubin: 0.4 mg/dL (ref 0.3–1.2)
Total Protein: 6.3 g/dL — ABNORMAL LOW (ref 6.5–8.1)

## 2018-05-10 LAB — IRON AND TIBC
Iron: 29 ug/dL — ABNORMAL LOW (ref 41–142)
Saturation Ratios: 14 % — ABNORMAL LOW (ref 21–57)
TIBC: 201 ug/dL — ABNORMAL LOW (ref 236–444)
UIBC: 172 ug/dL

## 2018-05-10 MED ORDER — IOHEXOL 300 MG/ML  SOLN
100.0000 mL | Freq: Once | INTRAMUSCULAR | Status: AC | PRN
  Administered 2018-05-10: 75 mL via INTRAVENOUS

## 2018-05-10 NOTE — Telephone Encounter (Signed)
Audelia Acton please put in order for CT with contrast per Dr. Redmond School

## 2018-05-10 NOTE — Telephone Encounter (Signed)
Do you pt to have a dexa scan or ct of of pelvis. Please advise [facility]

## 2018-05-10 NOTE — Telephone Encounter (Signed)
done

## 2018-05-10 NOTE — Telephone Encounter (Signed)
Pt was approved to have CT of the pelvis and lumbar spine. Pt son was given number to [facility] to schedule time he cold bring his mom in. Pt son was asked to call the office for any additional information.

## 2018-05-10 NOTE — Telephone Encounter (Signed)
Scheduled appt per 8/21 los. Gave patient AVS and calender per los. Scheduled blood for 8/23 due to no availability tomorrow here or at [facility].

## 2018-05-10 NOTE — Telephone Encounter (Signed)
Son called and states didn't hear back from telephone call yesterday & pt still hurting, I apologized and advised pain medicine has been sent in and Dr. Redmond School ordered bone scan,  He states he has to take her to an appt at 1:30 & would like it done this afternoon if possible while he has mom in town.  Called Dr. Redmond School and he wants CT of Lumbar spine & pelvis.

## 2018-05-11 ENCOUNTER — Other Ambulatory Visit: Payer: Self-pay

## 2018-05-11 ENCOUNTER — Other Ambulatory Visit: Payer: Self-pay | Admitting: *Deleted

## 2018-05-11 ENCOUNTER — Telehealth: Payer: Self-pay

## 2018-05-11 DIAGNOSIS — D500 Iron deficiency anemia secondary to blood loss (chronic): Secondary | ICD-10-CM

## 2018-05-11 NOTE — Telephone Encounter (Signed)
Spoke with patient son and he will be  Bringing her at the new time @7 :30 check in for labs, and then treatment. Per 8/22 sch msg

## 2018-05-11 NOTE — Telephone Encounter (Signed)
Son notified of results and appt made for tomorrow

## 2018-05-12 ENCOUNTER — Inpatient Hospital Stay: Payer: Medicare HMO

## 2018-05-12 ENCOUNTER — Encounter: Payer: Self-pay | Admitting: Family Medicine

## 2018-05-12 ENCOUNTER — Ambulatory Visit: Payer: Medicare HMO

## 2018-05-12 ENCOUNTER — Ambulatory Visit (INDEPENDENT_AMBULATORY_CARE_PROVIDER_SITE_OTHER): Payer: Medicare HMO | Admitting: Family Medicine

## 2018-05-12 ENCOUNTER — Telehealth: Payer: Self-pay

## 2018-05-12 ENCOUNTER — Other Ambulatory Visit: Payer: Self-pay

## 2018-05-12 VITALS — BP 144/62 | HR 65 | Temp 98.0°F | Wt 118.6 lb

## 2018-05-12 DIAGNOSIS — I5022 Chronic systolic (congestive) heart failure: Secondary | ICD-10-CM | POA: Diagnosis not present

## 2018-05-12 DIAGNOSIS — R102 Pelvic and perineal pain: Secondary | ICD-10-CM

## 2018-05-12 DIAGNOSIS — D500 Iron deficiency anemia secondary to blood loss (chronic): Secondary | ICD-10-CM

## 2018-05-12 DIAGNOSIS — I110 Hypertensive heart disease with heart failure: Secondary | ICD-10-CM | POA: Diagnosis not present

## 2018-05-12 DIAGNOSIS — M84454G Pathological fracture, pelvis, subsequent encounter for fracture with delayed healing: Secondary | ICD-10-CM

## 2018-05-12 DIAGNOSIS — D649 Anemia, unspecified: Secondary | ICD-10-CM

## 2018-05-12 DIAGNOSIS — E119 Type 2 diabetes mellitus without complications: Secondary | ICD-10-CM | POA: Diagnosis not present

## 2018-05-12 DIAGNOSIS — E2839 Other primary ovarian failure: Secondary | ICD-10-CM | POA: Diagnosis not present

## 2018-05-12 DIAGNOSIS — D509 Iron deficiency anemia, unspecified: Secondary | ICD-10-CM | POA: Diagnosis not present

## 2018-05-12 DIAGNOSIS — M899 Disorder of bone, unspecified: Secondary | ICD-10-CM | POA: Diagnosis not present

## 2018-05-12 DIAGNOSIS — Z79899 Other long term (current) drug therapy: Secondary | ICD-10-CM | POA: Diagnosis not present

## 2018-05-12 DIAGNOSIS — E039 Hypothyroidism, unspecified: Secondary | ICD-10-CM | POA: Diagnosis not present

## 2018-05-12 LAB — PREPARE RBC (CROSSMATCH)

## 2018-05-12 MED ORDER — ACETAMINOPHEN 325 MG PO TABS
650.0000 mg | ORAL_TABLET | Freq: Once | ORAL | Status: AC
  Administered 2018-05-12: 650 mg via ORAL

## 2018-05-12 MED ORDER — CALCITONIN (SALMON) 200 UNIT/ACT NA SOLN: 1.0000 | Freq: Every day | NASAL | 1 refills | Status: DC

## 2018-05-12 MED ORDER — DIPHENHYDRAMINE HCL 25 MG PO CAPS
ORAL_CAPSULE | ORAL | Status: AC
  Filled 2018-05-12: qty 1

## 2018-05-12 MED ORDER — SODIUM CHLORIDE 0.9% IV SOLUTION
250.0000 mL | Freq: Once | INTRAVENOUS | Status: DC
  Filled 2018-05-12: qty 250

## 2018-05-12 MED ORDER — SODIUM CHLORIDE 0.9 % IV SOLN
250.0000 mL | Freq: Once | INTRAVENOUS | Status: DC
  Filled 2018-05-12: qty 250

## 2018-05-12 MED ORDER — ACETAMINOPHEN 325 MG PO TABS
ORAL_TABLET | ORAL | Status: AC
  Filled 2018-05-12: qty 2

## 2018-05-12 MED ORDER — DIPHENHYDRAMINE HCL 25 MG PO CAPS
25.0000 mg | ORAL_CAPSULE | Freq: Once | ORAL | Status: AC
  Administered 2018-05-12: 25 mg via ORAL

## 2018-05-12 MED ORDER — SODIUM CHLORIDE 0.9% IV SOLUTION
250.0000 mL | Freq: Once | INTRAVENOUS | Status: AC
  Administered 2018-05-12: 250 mL via INTRAVENOUS
  Filled 2018-05-12: qty 250

## 2018-05-12 NOTE — Progress Notes (Signed)
   Subjective:    Patient ID: Connie [name], female    DOB: [birth year], 84 y.o.   MRN: [id number]  HPI She is here for follow-up on her pelvic pain.  The scans did show actually 2 fractures.  Discussion with her indicates that there is no record of her having a DEXA scan.  Her case was discussed with Dr. Neoma Laming sure concerning appropriate testing.   Review of Systems     Objective:   Physical Exam Alert and complaining of pelvic pain.       Assessment & Plan:  Pelvic pain - Plan: Protein electrophoresis, serum, PTH, Intact and Calcium, calcitonin, salmon, (MIACALCIN) 200 UNIT/ACT nasal spray, VITAMIN D 25 Hydroxy (Vit-D Deficiency, Fractures)  Pathological fracture of pelvis with delayed healing, unspecified pathological cause, subsequent encounter - Plan: Protein electrophoresis, serum, PTH, Intact and Calcium, calcitonin, salmon, (MIACALCIN) 200 UNIT/ACT nasal spray, VITAMIN D 25 Hydroxy (Vit-D Deficiency, Fractures)  Estrogen deficiency - Plan: DG Bone Density So far this looks like osteoporotic pathologic fracture.  I did discuss the findings especially the foraminal stenosis.  I explained that I did not think that this was a major player at the present time.  I will get a DEXA scan and also follow-up on making sure that there are no other metabolic issues particularly parathyroid or possibly multiple myeloma.  We will also give her calcitonin to see if this will help with her pain.  She does have codeine at home.  Her son was with her on this and she and he are in agreement.

## 2018-05-12 NOTE — Telephone Encounter (Signed)
CALL HER up until her estrogen deficient

## 2018-05-12 NOTE — Telephone Encounter (Signed)
[place] called from [place] and stated pt insurance will not cover the diagnosis associated with the Bone Density. Please change diagnosis. Some that will approved are as follow:  1.Ostreopenia  2.Estrogen Deficiency  3.Verteral Fracture  4.Hypoparathyroid  5.Hypoestrogenia  [place] can be reached at [phone]

## 2018-05-12 NOTE — Telephone Encounter (Signed)
Done

## 2018-05-12 NOTE — Patient Instructions (Signed)
Make sure you take extra vitamin D and calcium.

## 2018-05-13 LAB — VITAMIN D 25 HYDROXY (VIT D DEFICIENCY, FRACTURES): Vit D, 25-Hydroxy: 34.5 ng/mL (ref 30.0–100.0)

## 2018-05-14 LAB — BPAM RBC
BLOOD PRODUCT EXPIRATION DATE: 201909142359
Blood Product Expiration Date: 201909142359
ISSUE DATE / TIME: 201908230934
ISSUE DATE / TIME: 201908230934
UNIT TYPE AND RH: 6200
Unit Type and Rh: 6200

## 2018-05-14 LAB — TYPE AND SCREEN
ABO/RH(D): A POS
Antibody Screen: NEGATIVE
Unit division: 0
Unit division: 0

## 2018-05-16 ENCOUNTER — Other Ambulatory Visit: Payer: Self-pay

## 2018-05-16 LAB — PROTEIN ELECTROPHORESIS, SERUM
A/G Ratio: 1.2 (ref 0.7–1.7)
Albumin ELP: 3.4 g/dL (ref 2.9–4.4)
Alpha 1: 0.4 g/dL (ref 0.0–0.4)
Alpha 2: 0.8 g/dL (ref 0.4–1.0)
BETA: 0.9 g/dL (ref 0.7–1.3)
GAMMA GLOBULIN: 0.7 g/dL (ref 0.4–1.8)
GLOBULIN, TOTAL: 2.8 g/dL (ref 2.2–3.9)
TOTAL PROTEIN: 6.2 g/dL (ref 6.0–8.5)

## 2018-05-16 LAB — PTH, INTACT AND CALCIUM
Calcium: 8.5 mg/dL — ABNORMAL LOW (ref 8.7–10.3)
PTH: 71 pg/mL — AB (ref 15–65)

## 2018-05-17 ENCOUNTER — Telehealth: Payer: Self-pay | Admitting: Family Medicine

## 2018-05-17 DIAGNOSIS — M545 Low back pain, unspecified: Secondary | ICD-10-CM

## 2018-05-17 MED ORDER — HYDROCODONE-ACETAMINOPHEN 5-325 MG PO TABS: 1.0000 | ORAL_TABLET | Freq: Four times a day (QID) | ORAL | 0 refills | Status: DC | PRN

## 2018-05-17 NOTE — Telephone Encounter (Signed)
   Needs refill on hydrocodone-acetaminophen  [facility] [place]

## 2018-05-23 ENCOUNTER — Other Ambulatory Visit: Payer: Self-pay

## 2018-05-23 DIAGNOSIS — M8589 Other specified disorders of bone density and structure, multiple sites: Secondary | ICD-10-CM | POA: Diagnosis not present

## 2018-05-23 DIAGNOSIS — M810 Age-related osteoporosis without current pathological fracture: Secondary | ICD-10-CM | POA: Diagnosis not present

## 2018-05-23 DIAGNOSIS — M85832 Other specified disorders of bone density and structure, left forearm: Secondary | ICD-10-CM | POA: Diagnosis not present

## 2018-05-23 DIAGNOSIS — D500 Iron deficiency anemia secondary to blood loss (chronic): Secondary | ICD-10-CM

## 2018-05-23 LAB — HM DEXA SCAN

## 2018-05-24 ENCOUNTER — Inpatient Hospital Stay: Payer: Medicare HMO

## 2018-05-24 ENCOUNTER — Inpatient Hospital Stay: Payer: Medicare HMO | Attending: Hematology

## 2018-05-24 ENCOUNTER — Other Ambulatory Visit: Payer: Self-pay | Admitting: Hematology

## 2018-05-24 VITALS — BP 144/31 | HR 62 | Temp 97.6°F | Resp 18

## 2018-05-24 DIAGNOSIS — D500 Iron deficiency anemia secondary to blood loss (chronic): Secondary | ICD-10-CM

## 2018-05-24 DIAGNOSIS — D509 Iron deficiency anemia, unspecified: Secondary | ICD-10-CM | POA: Insufficient documentation

## 2018-05-24 MED ORDER — SODIUM CHLORIDE 0.9 % IV SOLN
750.0000 mg | Freq: Once | INTRAVENOUS | Status: AC
  Administered 2018-05-24: 750 mg via INTRAVENOUS
  Filled 2018-05-24: qty 15

## 2018-05-24 MED ORDER — SODIUM CHLORIDE 0.9 % IV SOLN
Freq: Once | INTRAVENOUS | Status: AC
  Administered 2018-05-24: 10:00:00 via INTRAVENOUS
  Filled 2018-05-24: qty 250

## 2018-05-24 NOTE — Progress Notes (Signed)
Patient declined 30 minutes observation post injectafer iron. Vitals obtained and stable. Ambulatory with four wheel walker in company of son.

## 2018-05-24 NOTE — Patient Instructions (Signed)
Ferric carboxymaltose injection What is this medicine? FERRIC CARBOXYMALTOSE (ferr-ik car-box-ee-mol-toes) is an iron complex. Iron is used to make healthy red blood cells, which carry oxygen and nutrients throughout the body. This medicine is used to treat anemia in people with chronic kidney disease or people who cannot take iron by mouth. This medicine may be used for other purposes; ask your health care provider or pharmacist if you have questions. COMMON BRAND NAME(S): Injectafer What should I tell my health care provider before I take this medicine? They need to know if you have any of these conditions: -anemia not caused by low iron levels -high levels of iron in the blood -liver disease -an unusual or allergic reaction to iron, other medicines, foods, dyes, or preservatives -pregnant or trying to get pregnant -breast-feeding How should I use this medicine? This medicine is for infusion into a vein. It is given by a health care professional in a hospital or clinic setting. Talk to your pediatrician regarding the use of this medicine in children. Special care may be needed. Overdosage: If you think you have taken too much of this medicine contact a poison control center or emergency room at once. NOTE: This medicine is only for you. Do not share this medicine with others. What if I miss a dose? It is important not to miss your dose. Call your doctor or health care professional if you are unable to keep an appointment. What may interact with this medicine? Do not take this medicine with any of the following medications: -deferoxamine -dimercaprol -other iron products This medicine may also interact with the following medications: -chloramphenicol -deferasirox This list may not describe all possible interactions. Give your health care provider a list of all the medicines, herbs, non-prescription drugs, or dietary supplements you use. Also tell them if you smoke, drink alcohol, or use  illegal drugs. Some items may interact with your medicine. What should I watch for while using this medicine? Visit your doctor or health care professional regularly. Tell your doctor if your symptoms do not start to get better or if they get worse. You may need blood work done while you are taking this medicine. You may need to follow a special diet. Talk to your doctor. Foods that contain iron include: whole grains/cereals, dried fruits, beans, or peas, leafy green vegetables, and organ meats (liver, kidney). What side effects may I notice from receiving this medicine? Side effects that you should report to your doctor or health care professional as soon as possible: -allergic reactions like skin rash, itching or hives, swelling of the face, lips, or tongue -breathing problems -changes in blood pressure -feeling faint or lightheaded, falls -flushing, sweating, or hot feelings Side effects that usually do not require medical attention (report to your doctor or health care professional if they continue or are bothersome): -changes in taste -constipation -dizziness -headache -nausea -pain, redness, or irritation at site where injected -vomiting This list may not describe all possible side effects. Call your doctor for medical advice about side effects. You may report side effects to FDA at 1-800-FDA-1088. Where should I keep my medicine? This drug is given in a hospital or clinic and will not be stored at home. NOTE: This sheet is a summary. It may not cover all possible information. If you have questions about this medicine, talk to your doctor, pharmacist, or health care provider.  2018 Elsevier/Gold Standard (2015-10-09 11:20:47)  

## 2018-05-25 ENCOUNTER — Telehealth: Payer: Self-pay

## 2018-05-25 DIAGNOSIS — M810 Age-related osteoporosis without current pathological fracture: Secondary | ICD-10-CM | POA: Insufficient documentation

## 2018-05-25 NOTE — Telephone Encounter (Signed)
Pt son was called to advise to make an appt to discuss the dexa scan. Connie [name]

## 2018-05-26 ENCOUNTER — Ambulatory Visit (INDEPENDENT_AMBULATORY_CARE_PROVIDER_SITE_OTHER): Payer: Medicare HMO | Admitting: Family Medicine

## 2018-05-26 ENCOUNTER — Encounter: Payer: Self-pay | Admitting: Family Medicine

## 2018-05-26 VITALS — BP 120/78 | HR 58 | Temp 97.9°F | Wt 113.2 lb

## 2018-05-26 DIAGNOSIS — M8000XA Age-related osteoporosis with current pathological fracture, unspecified site, initial encounter for fracture: Secondary | ICD-10-CM

## 2018-05-26 MED ORDER — DENOSUMAB 60 MG/ML ~~LOC~~ SOSY: 60.0000 mg | PREFILLED_SYRINGE | Freq: Once | SUBCUTANEOUS | 0 refills | Status: AC

## 2018-05-26 NOTE — Progress Notes (Signed)
   Subjective:    Patient ID: Connie [name], female    DOB: [birth year], 84 y.o.   MRN: [id number]  HPI She is here for follow-up visit.  She has had a recent evaluation for what turned out to be pathologic fracture.  Recent DEXA scan was -2.5.  In the past she had not been on any bisphosphonates.  She continues to complain of pain.  She was recently given calcitonin but this has not helped much.  She continues on codeine.   Review of Systems     Objective:   Physical Exam Alert and in no distress otherwise not examined       Assessment & Plan:  Age-related osteoporosis with current pathological fracture, initial encounter - Plan: denosumab (PROLIA) 60 MG/ML SOSY injection I discussed the options concerning treatment of this with her and her son.  We have decided to go with Prolia which should be easier to deal with.  She will continue to use calcitonin for a full month.  We will continue her on pain relievers.  Discussed possible referral if she continues have difficulty.  She did mention some lesions present on her right anterior knee near the incision.  There were nonspecific in nature.

## 2018-06-13 ENCOUNTER — Other Ambulatory Visit: Payer: Medicare HMO

## 2018-06-14 ENCOUNTER — Ambulatory Visit (INDEPENDENT_AMBULATORY_CARE_PROVIDER_SITE_OTHER): Payer: Medicare HMO | Admitting: Family Medicine

## 2018-06-14 ENCOUNTER — Encounter: Payer: Self-pay | Admitting: Family Medicine

## 2018-06-14 VITALS — BP 110/68 | HR 61 | Temp 97.7°F | Wt 111.0 lb

## 2018-06-14 DIAGNOSIS — M545 Low back pain, unspecified: Secondary | ICD-10-CM

## 2018-06-14 DIAGNOSIS — K31811 Angiodysplasia of stomach and duodenum with bleeding: Secondary | ICD-10-CM

## 2018-06-14 DIAGNOSIS — Z23 Encounter for immunization: Secondary | ICD-10-CM

## 2018-06-14 DIAGNOSIS — I5022 Chronic systolic (congestive) heart failure: Secondary | ICD-10-CM | POA: Diagnosis not present

## 2018-06-14 DIAGNOSIS — M810 Age-related osteoporosis without current pathological fracture: Secondary | ICD-10-CM

## 2018-06-14 DIAGNOSIS — E1169 Type 2 diabetes mellitus with other specified complication: Secondary | ICD-10-CM

## 2018-06-14 DIAGNOSIS — E785 Hyperlipidemia, unspecified: Secondary | ICD-10-CM

## 2018-06-14 DIAGNOSIS — M8000XA Age-related osteoporosis with current pathological fracture, unspecified site, initial encounter for fracture: Secondary | ICD-10-CM

## 2018-06-14 DIAGNOSIS — K219 Gastro-esophageal reflux disease without esophagitis: Secondary | ICD-10-CM | POA: Diagnosis not present

## 2018-06-14 DIAGNOSIS — E039 Hypothyroidism, unspecified: Secondary | ICD-10-CM

## 2018-06-14 DIAGNOSIS — E118 Type 2 diabetes mellitus with unspecified complications: Secondary | ICD-10-CM

## 2018-06-14 MED ORDER — DENOSUMAB 60 MG/ML ~~LOC~~ SOSY
60.0000 mg | PREFILLED_SYRINGE | Freq: Once | SUBCUTANEOUS | Status: AC
  Administered 2018-06-14: 60 mg via SUBCUTANEOUS

## 2018-06-14 MED ORDER — HYDROCODONE-ACETAMINOPHEN 5-325 MG PO TABS: 1.0000 | ORAL_TABLET | Freq: Four times a day (QID) | ORAL | 0 refills | Status: DC | PRN

## 2018-06-14 NOTE — Patient Instructions (Signed)
  Connie [name] , Thank you for taking time to come for your Medicare Wellness Visit. I appreciate your ongoing commitment to your health goals. Please review the following plan we discussed and let me know if I can assist you in the future.   These are the goals we discussed: Goals   None     This is a list of the screening recommended for you and due dates:  Health Maintenance  Topic Date Due  . Complete foot exam   07/26/2015  . Tetanus Vaccine  08/03/2016  . Eye exam for diabetics  05/27/2018  . Flu Shot  07/20/2018*  . Hemoglobin A1C  08/10/2018  . Urine Protein Check  10/19/2018  . DEXA scan (bone density measurement)  Completed  . Pneumonia vaccines  Completed  *Topic was postponed. The date shown is not the original due date.

## 2018-06-14 NOTE — Progress Notes (Signed)
Connie [name] is a 84 y.o. female who presents for annual wellness visit and follow-up on chronic medical conditions.  She has the following concerns: She continues to have pelvic pain and would like a refill on her medications.  She continues on her blood pressure medications and is having no difficulty with them.  She continues on Synthroid.  Her reflux is well controlled with Protonix.  She has had no chest pain, shortness of breath or PND.  She is scheduled for a Prolia shot today. She has no other concerns or complaints. Immunizations and Health Maintenance Immunization History  Administered Date(s) Administered  . Influenza Whole 06/12/2008, 08/03/2010  . Influenza, High Dose Seasonal PF 06/24/2014, 05/19/2015  . Influenza,inj,Quad PF,6+ Mos 06/07/2013  . Influenza-Unspecified 06/10/2016  . Pneumococcal Conjugate-13 07/25/2014  . Pneumococcal Polysaccharide-23 08/03/2006  . Td 08/03/2006  . Zoster 11/11/2006  . Zoster Recombinat (Shingrix) 08/30/2017   Health Maintenance Due  Topic Date Due  . FOOT EXAM  07/26/2015  . TETANUS/TDAP  08/03/2016  . OPHTHALMOLOGY EXAM  05/27/2018    Last Pap smear:N/A Last mammogram: last year Last colonoscopy: two years ago Last DEXA:05/2018 Dentist: 05/2018 Ophtho:2019 Exercise: walking   Other doctors caring for patient include:Kale.Skains  Advanced directives: Yes.  Copy asked for. Does Patient Have a Medical Advance Directive?: Yes Type of Advance Directive: Living will, Healthcare Power of Attorney Does patient want to make changes to medical advance directive?: No - Patient declined Copy of [place] in Chart?: No - copy requested  Depression screen:  See questionnaire below.  Depression screen [facility] 2/9 06/14/2018 04/27/2018 02/16/2017 02/02/2016 12/02/2014  Decreased Interest 0 0 0 0 0  Down, Depressed, Hopeless 0 0 0 0 0  PHQ - 2 Score 0 0 0 0 0  Some recent data might be hidden    Fall Risk Screen: see  questionnaire below. Fall Risk  06/14/2018 04/27/2018 02/16/2017 02/02/2016 12/02/2014  Falls in the past year? Yes Yes No No No  Number falls in past yr: 1 1 - - -  Injury with Fall? Yes Yes - - -  Risk for fall due to : - Impaired balance/gait - - -  Follow up Falls prevention discussed - - - -    ADL screen:  See questionnaire below Functional Status Survey: Is the patient deaf or have difficulty hearing?: Yes Does the patient have difficulty seeing, even when wearing glasses/contacts?: Yes Does the patient have difficulty concentrating, remembering, or making decisions?: No Does the patient have difficulty walking or climbing stairs?: No Does the patient have difficulty dressing or bathing?: No Does the patient have difficulty doing errands alone such as visiting a doctor's office or shopping?: Yes   Review of Systems Constitutional: -, -unexpected weight change, -anorexia, -fatigue Allergy: -sneezing, -itching, -congestion Dermatology: denies changing moles, rash, lumps ENT: -runny nose, -ear pain, -sore throat,  Cardiology:  -chest pain, -palpitations, -orthopnea, Respiratory: -cough, -shortness of breath, -dyspnea on exertion, -wheezing,  Gastroenterology: -abdominal pain, -nausea, -vomiting, -diarrhea, -constipation, -dysphagia Hematology: -bleeding or bruising problems Musculoskeletal: -arthralgias, -myalgias, -joint swelling, -back pain, - Ophthalmology: -vision changes,  Urology: -dysuria, -difficulty urinating,  -urinary frequency, -urgency, incontinence Neurology: -, -numbness, , -memory loss, -falls, -dizziness    PHYSICAL EXAM:   General Appearance: Alert, cooperative, no distress, appears stated age Psych: Normal mood, affect, hygiene and grooming. A1c is 5.4 ASSESSMENT/PLAN: Need for influenza vaccination - Plan: Flu vaccine HIGH DOSE PF (Fluzone High dose)  Acute right-sided low back pain  without sciatica - Plan: HYDROcodone-acetaminophen (NORCO) 5-325 MG  tablet  Angiodysplasia of stomach and duodenum with hemorrhage  Chronic systolic CHF (congestive heart failure) ([place])  Hyperlipidemia associated with type 2 diabetes mellitus ([name])  Osteoporosis, unspecified osteoporosis type, unspecified pathological fracture presence  Type 2 diabetes mellitus with complication, without long-term current use of insulin ([place]) She did have an A1c at 6.7 in the past but since then has been doing quite nicely.  She will continue on her present medication regimen.  Did recommend they stop taking the calcitonin.  Codeine was called in.  She will continue to be monitored by her surgeon as she has had a recent surgery to the eyelids.  Medicare Attestation I have personally reviewed: The patient's medical and social history Their use of alcohol, tobacco or illicit drugs Their current medications and supplements The patient's functional ability including ADLs,fall risks, home safety risks, cognitive, and hearing and visual impairment Diet and physical activities Evidence for depression or mood disorders  The patient's weight, height, and BMI have been recorded in the chart.  I have made referrals, counseling, and provided education to the patient based on review of the above and I have provided the patient with a written personalized care plan for preventive services.     Jill Alexanders, MD   06/14/2018

## 2018-06-22 NOTE — Progress Notes (Signed)
[facility]    HEMATOLOGY/ONCOLOGY CLINIC NOTE  Date of Service: 06/23/18    Connie Connie [name] Care Team: [name], MD as PCP - General (Family Medicine)  CHIEF COMPLAINTS/PURPOSE OF CONSULTATION:  Iron deficiency anemia due to GI bleeding from multiple AVMs  DIAGNOSIS: Iron deficiency anemia likely related to ongoing chronic GI bleeding from multiple AVMs  TREATMENT -IV iron replacement with injectafer as needed (tolerated well). (Had some shortness of breath with IV Feraheme - which was likely fluid overload as opposed to a true allergic reaction but Connie Connie [name] has been hesitant to take this)  HISTORY OF PRESENTING ILLNESS:  Please see previous note for details  INTERVAL HISTORY  Connie Connie [name] is here for followup for Connie Connie [name] IDA. Connie Connie Connie [name]'s last visit with [place] was on 05/10/18. Connie Connie [name] is accompanied today by Connie Connie [name] son. Connie Connie [name] reports that Connie Connie [name] is doing well overall.   Connie Connie [name] reports that Connie Connie [name] has continued to not notice any obvious blood in Connie stools nor black stools. Connie Connie [name] notes that Connie Connie [name] energy levels are a little better. Connie Connie [name] has not had any follow ups with GI in Connie interim and denies any being scheduled at this time. Connie Connie [name] denies any current concerns for infections.   Lab results today (06/23/18) of CBC w/diff is as follows: all values are WNL except for RBC at 2.48, HGB at 8.1, RDW at 18.6, ANC at 6.9k, Lymphs abs at 400. 06/23/18 CMP is pending 06/23/18 Ferritin is pending 06/23/18 Iron and TIBC is pending  On review of systems, Connie [name] reports improved energy levels, stable leg swelling, and denies blood in Connie stools, black stools, concerns for infections, abdominal pains, and any other symptoms.    MEDICAL HISTORY:  Past Medical History:  Diagnosis Date  . Allergy    RHINITIS  . Angiodysplasia of duodenum    hx/notes 11/21/2015  . Angiodysplasia of stomach    hx/notes 11/21/2015  . Arthritis    "joints ache"  . Bleeding gastric ulcer    hx/notes 11/21/2015  . Chronic blood loss anemia    secondary to angiodysplasia of Connie stomach and duodenum as well as history of bleeding gastric ulcer hx/notes 11/21/2015  . Chronic systolic CHF (congestive heart failure) ([place]) 12/23/2015  . History of blood transfusion 01/2015; 06/2015; 11/21/2015  . Hypercholesteremia   . Hypertension   . Hypothyroidism   . Obesity   . Pneumonia "several times"  . Type II diabetes mellitus ([place])     SURGICAL HISTORY: Past Surgical History:  Procedure Laterality Date  . CARDIAC CATHETERIZATION N/A 12/19/2015   Procedure: Left Heart Cath and Coronary Angiography;  Surgeon: Leonie Man, MD;  Location: [place] [facility];  Service: Cardiovascular;  Laterality: N/A;  . COLONOSCOPY N/A 08/30/2014   Procedure: COLONOSCOPY;  Surgeon: Beryle Beams, MD;  Location: [facility] ENDOSCOPY;  Service: Endoscopy;  Laterality: N/A;  . COLONOSCOPY N/A 02/21/2015   Procedure: COLONOSCOPY;  Surgeon: Carol Ada, MD;  Location: [facility] ENDOSCOPY;  Service: Endoscopy;  Laterality: N/A;  . ENTEROSCOPY N/A 06/06/2015   Procedure: ENTEROSCOPY;  Surgeon: Carol Ada, MD;  Location: [facility] ENDOSCOPY;  Service: Endoscopy;  Laterality: N/A;  . FRACTURE SURGERY    . GIVENS CAPSULE STUDY N/A 05/15/2015   Procedure: GIVENS CAPSULE STUDY;  Surgeon: Carol Ada, MD;  Location: [facility] ENDOSCOPY;  Service: Endoscopy;  Laterality: N/A;  . HOT HEMOSTASIS N/A 08/30/2014   Procedure: HOT HEMOSTASIS (ARGON PLASMA COAGULATION/BICAP);  Surgeon: Beryle Beams, MD;  Location: [facility];  Service: Endoscopy;  Laterality:  N/A;  . HOT HEMOSTASIS N/A 06/06/2015   Procedure: HOT HEMOSTASIS (ARGON PLASMA COAGULATION/BICAP);  Surgeon: Carol Ada, MD;  Location: [facility]OS[facility];  Service: Endoscopy;  Laterality: N/A;  . JOINT REPLACEMENT    . REVISION TOTAL KNEE ARTHROPLASTY Bilateral 2004-2015  . SHOULDER OPEN ROTATOR CUFF REPAIR Right 12/2004   open subacromial decompression, distal clavicle  resection, rotator cuff repair/notes 02/02/2011  . TONSILLECTOMY  1940s  .  TOTAL KNEE ARTHROPLASTY Bilateral ~ 2002  . TOTAL THYROIDECTOMY  ~ 1966    SOCIAL HISTORY: Social History   Socioeconomic History  . Marital status: Married    Spouse name: Not on file  . Number of children: Not on file  . Years of education: Not on file  . Highest education level: Not on file  Occupational History  . Not on file  Social Needs  . Financial resource strain: Not on file  . Food insecurity:    Worry: Not on file    Inability: Not on file  . Transportation needs:    Medical: Not on file    Non-medical: Not on file  Tobacco Use  . Smoking status: Never Smoker  . Smokeless tobacco: Never Used  Substance and Sexual Activity  . Alcohol use: No  . Drug use: No  . Sexual activity: Not Currently  Lifestyle  . Physical activity:    Days per week: Not on file    Minutes per session: Not on file  . Stress: Not on file  Relationships  . Social connections:    Talks on phone: Not on file    Gets together: Not on file    Attends religious service: Not on file    Active member of club or organization: Not on file    Attends meetings of clubs or organizations: Not on file    Relationship status: Not on file  . Intimate partner violence:    Fear of current or ex partner: Not on file    Emotionally abused: Not on file    Physically abused: Not on file    Forced sexual activity: Not on file  Other Topics Concern  . Not on file  Social History Narrative  . Not on file    FAMILY HISTORY: Family History  Problem Relation Age of Onset  . Asthma Mother   . Ulcers Father   . Anemia Sister     ALLERGIES:  has No Known Allergies.  MEDICATIONS:  Current Outpatient Medications  Medication Sig Dispense Refill  . ACCU-CHEK FASTCLIX LANCETS MISC 1 each by Does not apply route 2 (two) times daily. DX E11.9 204 each 3  . acetaminophen (TYLENOL) 500 MG tablet Take 500 mg by mouth every 6 (six) hours as needed.    Marland Kitchen amLODipine (NORVASC) 10 MG tablet TAKE 1 TABLET (10 MG  TOTAL) BY MOUTH DAILY. 90 tablet 1  . atorvastatin (LIPITOR) 40 MG tablet TAKE 1 TABLET EVERY DAY 90 tablet 3  . Blood Glucose Monitoring Suppl (ACCU-CHEK GUIDE) w/Device KIT 1 each by Does not apply route 2 (two) times daily. DX E11.9 1 kit 0  . calcitonin, salmon, (MIACALCIN) 200 UNIT/ACT nasal spray Place 1 spray into alternate nostrils daily. 3.7 mL 1  . Calcium Carb-Cholecalciferol (CALCIUM 1000 + D PO) Take 1 tablet by mouth.    . carvedilol (COREG) 12.5 MG tablet TAKE 1 TABLET (12.5 MG TOTAL) BY MOUTH 2 (TWO) TIMES DAILY WITH A MEAL. 180 tablet 1  . furosemide (LASIX) 40 MG tablet TAKE 1  TABLET (40 MG TOTAL) BY MOUTH DAILY. 90 tablet 1  . glucose blood (ACCU-CHEK GUIDE) test strip Connie Connie [name] to test 2 times daily DX E11.9 Use as instructed 200 each 3  . HYDROcodone-acetaminophen (NORCO) 5-325 MG tablet Take 1 tablet by mouth every 6 (six) hours as needed for moderate pain. 30 tablet 0  . Lancets Misc. (ACCU-CHEK FASTCLIX LANCET) KIT 1 each by Does not apply route 2 (two) times daily. DX E11.9 1 kit 0  . levothyroxine (SYNTHROID, LEVOTHROID) 125 MCG tablet TAKE 1 TABLET EVERY DAY 90 tablet 3  . Multiple Vitamins-Minerals (WOMENS MULTIVITAMIN PLUS PO) Take 1 tablet by mouth daily.    . pantoprazole (PROTONIX) 20 MG tablet TAKE 1 TABLET (20 MG TOTAL) BY MOUTH DAILY. (Connie Connie [name] not taking: Reported on 04/27/2018) 90 tablet 3  . pioglitazone (ACTOS) 30 MG tablet TAKE 1 TABLET (30 MG TOTAL) BY MOUTH DAILY. 90 tablet 1  . potassium chloride (K-DUR) 10 MEQ tablet Take 1 tablet (10 mEq total) by mouth as directed. Take 10 meq twice a day every Mon, Wed and Fri's    . potassium chloride (K-DUR,KLOR-CON) 10 MEQ tablet TAKE 1 TABLET TWICE DAILY 180 tablet 1  . sacubitril-valsartan (ENTRESTO) 49-51 MG Take 1 tablet by mouth 2 (two) times daily. 180 tablet 3   No current facility-administered medications for this visit.     REVIEW OF SYSTEMS:    A 10+ POINT REVIEW OF SYSTEMS WAS OBTAINED including  neurology, dermatology, psychiatry, cardiac, respiratory, lymph, extremities, GI, GU, Musculoskeletal, constitutional, breasts, reproductive, HEENT.  All pertinent positives are noted in Connie HPI.  All others are negative.   PHYSICAL EXAMINATION:  ECOG PERFORMANCE STATUS: 1 - Symptomatic but completely ambulatory  Vitals:   06/23/18 0958  BP: (!) 142/39  Pulse: 65  Resp: 17  Temp: 97.8 F (36.6 C)  SpO2: 100%   Filed Weights   06/23/18 0958  Weight: 115 lb 9.6 oz (52.4 kg)   .Body mass index is 25.92 kg/m.  GENERAL:alert, in no acute distress and comfortable SKIN: no acute rashes, no significant lesions EYES: conjunctiva are pink and non-injected, sclera anicteric OROPHARYNX: MMM, no exudates, no oropharyngeal erythema or ulceration NECK: supple, no JVD LYMPH:  no palpable lymphadenopathy in Connie cervical, axillary or inguinal regions LUNGS: clear to auscultation b/l with normal respiratory effort HEART: regular rate & rhythm ABDOMEN:  normoactive bowel sounds , non tender, not distended. No palpable hepatosplenomegaly.  Extremity: no pedal edema PSYCH: alert & oriented x 3 with fluent speech NEURO: no focal motor/sensory deficits   LABORATORY DATA:    CBC Latest Ref Rng & Units 06/23/2018 05/10/2018 03/15/2018  WBC 3.9 - 10.3 K/uL 8.3 8.0 7.0  Hemoglobin 11.6 - 15.9 g/dL 8.1(L) 7.5(L) 6.9(LL)  Hematocrit 34.8 - 46.6 % 24.1(L) 24.3(L) 23.2(L)  Platelets 145 - 400 K/uL 259 381 371    . CBC    Component Value Date/Time   WBC 8.3 06/23/2018 0931   WBC 8.0 05/10/2018 1329   RBC 2.48 (L) 06/23/2018 0931   HGB 8.1 (L) 06/23/2018 0931   HGB 9.1 (L) 10/19/2017 1149   HGB 10.8 (L) 06/20/2017 1326   HCT 24.1 (L) 06/23/2018 0931   HCT 28.3 (L) 10/19/2017 1149   HCT 34.9 06/20/2017 1326   PLT 259 06/23/2018 0931   PLT 260 10/19/2017 1149   MCV 97.3 06/23/2018 0931   MCV 90 10/19/2017 1149   MCV 94.8 06/20/2017 1326   MCH 32.5 06/23/2018 0931   MCHC 33.4 06/23/2018 0931  RDW 18.6 (H) 06/23/2018 0931   RDW 14.6 10/19/2017 1149   RDW 14.2 06/20/2017 1326   LYMPHSABS 0.4 (L) 06/23/2018 0931   LYMPHSABS 0.5 (L) 10/19/2017 1149   LYMPHSABS 0.6 (L) 06/20/2017 1326   MONOABS 0.8 06/23/2018 0931   MONOABS 0.7 06/20/2017 1326   EOSABS 0.2 06/23/2018 0931   EOSABS 0.2 10/19/2017 1149   BASOSABS 0.1 06/23/2018 0931   BASOSABS 0.0 10/19/2017 1149   BASOSABS 0.0 06/20/2017 1326    . CMP Latest Ref Rng & Units 05/12/2018 05/10/2018 03/15/2018  Glucose 70 - 99 mg/dL - 101(H) 126(H)  BUN 8 - 23 mg/dL - 37(H) 34(H)  Creatinine 0.44 - 1.00 mg/dL - 1.11(H) 1.24(H)  Sodium 135 - 145 mmol/L - 144 142  Potassium 3.5 - 5.1 mmol/L - 3.5 4.2  Chloride 98 - 111 mmol/L - 106 106  CO2 22 - 32 mmol/L - 27 26  Calcium 8.7 - 10.3 mg/dL 8.5(L) 8.5(L) 8.7(L)  Total Protein 6.0 - 8.5 g/dL 6.2 6.3(L) 6.2(L)  Total Bilirubin 0.3 - 1.2 mg/dL - 0.4 0.4  Alkaline Phos 38 - 126 U/L - 167(H) 83  AST 15 - 41 U/L - 15 11(L)  ALT 0 - 44 U/L - <6 <6   . Lab Results  Component Value Date   IRON 22 (L) 06/23/2018   TIBC 214 (L) 06/23/2018   IRONPCTSAT 10 (L) 06/23/2018   (Iron and TIBC)  Lab Results  Component Value Date   FERRITIN 850 (H) 06/23/2018       ASSESSMENT & PLAN:   84 y.o. Caucasian female with  #1 Microcytic anemia likely related to iron deficiency from chronic GI bleeding. No known disorder causing chronic inflammation at this time.  #2 history of recurrent GI bleeding related to upper and lower GI tract AVMs and gastric erosions. Has had several admissions with requirement of multiple PRBC transfusions. Dropping ferritin suggests ongoing slow GI losses. Hemoglobin down from 10.8 to 9.4. No clinically overt GI bleeding noted.  #3 Hypothyroidism on levothyroxine .  #4 ?? Reaction to [facility] - current hospital admission that shortness of breath appears to be more likely related to CHF decompensation than allergic reaction to Feraheme. However Connie Connie [name] is  very anxious and hesitant to get this preparation. Switched to and tolerated Injectafer  PLAN:  -Absolutely no NSAIDs, only Tylenol for mild pain   -Discussed Connie [name] labwork today, 06/23/18; HGB stable at 8.1 -Will follow up with Connie [name] regarding Connie pending ferritin -Will continue with once a month IV Injectafer and tracking Ferritin for indication of frequency of infusions -Advised that Connie [name] return to care with Dr. Benson Norway in GI to address Connie source of Connie Connie [name] blood loss -Will continue with supportive blood transfusions as necessary -Connie [name] will let me know in Connie interim if Connie Connie [name] feels particularly fatigued, weak, dizzy, and we will set up a blood transfusion   RTC with Dr [name] in 2 months Please schedule monthly injectafer x 3 months Labs monthly   All of Connie patients questions were answered with apparent satisfaction. Connie Connie Connie [name] knows to call Connie clinic with any problems, questions or concerns.  Connie total time spent in Connie appt was 20 minutes and more than 50% was on counseling and direct Connie Connie [name] cares.      Sullivan Lone MD [place] AAHIVMS [facility] Hematology/Oncology Physician [facility]  (Office):       [phone] (Work cell):  [phone] (Fax):           [phone]  I, Baldwin [place], am acting as a scribe for Dr. [name]  .I have reviewed Connie above documentation for accuracy and completeness, and I agree with Connie above. Brunetta Genera MD

## 2018-06-23 ENCOUNTER — Inpatient Hospital Stay (HOSPITAL_BASED_OUTPATIENT_CLINIC_OR_DEPARTMENT_OTHER): Payer: Medicare HMO | Admitting: Hematology

## 2018-06-23 ENCOUNTER — Telehealth: Payer: Self-pay

## 2018-06-23 ENCOUNTER — Inpatient Hospital Stay: Payer: Medicare HMO | Attending: Hematology

## 2018-06-23 ENCOUNTER — Telehealth: Payer: Self-pay | Admitting: Hematology

## 2018-06-23 ENCOUNTER — Inpatient Hospital Stay: Payer: Medicare HMO

## 2018-06-23 VITALS — BP 140/47 | HR 65 | Temp 97.8°F | Resp 17 | Ht <= 58 in | Wt 115.6 lb

## 2018-06-23 VITALS — BP 115/44 | HR 57 | Temp 97.8°F | Resp 17

## 2018-06-23 DIAGNOSIS — E669 Obesity, unspecified: Secondary | ICD-10-CM | POA: Diagnosis not present

## 2018-06-23 DIAGNOSIS — E119 Type 2 diabetes mellitus without complications: Secondary | ICD-10-CM | POA: Insufficient documentation

## 2018-06-23 DIAGNOSIS — E039 Hypothyroidism, unspecified: Secondary | ICD-10-CM | POA: Diagnosis not present

## 2018-06-23 DIAGNOSIS — I5022 Chronic systolic (congestive) heart failure: Secondary | ICD-10-CM | POA: Diagnosis not present

## 2018-06-23 DIAGNOSIS — I10 Essential (primary) hypertension: Secondary | ICD-10-CM

## 2018-06-23 DIAGNOSIS — D509 Iron deficiency anemia, unspecified: Secondary | ICD-10-CM | POA: Insufficient documentation

## 2018-06-23 DIAGNOSIS — I110 Hypertensive heart disease with heart failure: Secondary | ICD-10-CM | POA: Insufficient documentation

## 2018-06-23 DIAGNOSIS — D500 Iron deficiency anemia secondary to blood loss (chronic): Secondary | ICD-10-CM

## 2018-06-23 LAB — CMP (CANCER CENTER ONLY)
ALT: <6 U/L (ref 0–44)
ANION GAP: 9 (ref 5–15)
AST: 17 U/L (ref 15–41)
Albumin: 3.5 g/dL (ref 3.5–5.0)
Alkaline Phosphatase: 156 U/L — ABNORMAL HIGH (ref 38–126)
BUN: 24 mg/dL — AB (ref 8–23)
CHLORIDE: 109 mmol/L (ref 98–111)
CO2: 25 mmol/L (ref 22–32)
Calcium: 6.2 mg/dL — CL (ref 8.9–10.3)
Creatinine: 1.13 mg/dL — ABNORMAL HIGH (ref 0.44–1.00)
GFR, EST AFRICAN AMERICAN: 51 mL/min — AB (ref >60)
GFR, EST NON AFRICAN AMERICAN: 44 mL/min — AB (ref >60)
Glucose, Bld: 129 mg/dL — ABNORMAL HIGH (ref 70–99)
Potassium: 4.1 mmol/L (ref 3.5–5.1)
Sodium: 143 mmol/L (ref 135–145)
TOTAL PROTEIN: 6.6 g/dL (ref 6.5–8.1)
Total Bilirubin: 0.6 mg/dL (ref 0.3–1.2)

## 2018-06-23 LAB — IRON AND TIBC
IRON: 22 ug/dL — AB (ref 41–142)
SATURATION RATIOS: 10 % — AB (ref 21–57)
TIBC: 214 ug/dL — AB (ref 236–444)
UIBC: 191 ug/dL

## 2018-06-23 LAB — CBC WITH DIFFERENTIAL (CANCER CENTER ONLY)
BASOS ABS: 0.1 10*3/uL (ref 0.0–0.1)
Basophils Relative: 1 %
EOS PCT: 2 %
Eosinophils Absolute: 0.2 10*3/uL (ref 0.0–0.5)
HEMATOCRIT: 24.1 % — AB (ref 34.8–46.6)
HEMOGLOBIN: 8.1 g/dL — AB (ref 11.6–15.9)
LYMPHS ABS: 0.4 10*3/uL — AB (ref 0.9–3.3)
LYMPHS PCT: 4 %
MCH: 32.5 pg (ref 25.1–34.0)
MCHC: 33.4 g/dL (ref 31.5–36.0)
MCV: 97.3 fL (ref 79.5–101.0)
Monocytes Absolute: 0.8 10*3/uL (ref 0.1–0.9)
Monocytes Relative: 10 %
NEUTROS ABS: 6.9 10*3/uL — AB (ref 1.5–6.5)
NEUTROS PCT: 83 %
PLATELETS: 259 10*3/uL (ref 145–400)
RBC: 2.48 MIL/uL — AB (ref 3.70–5.45)
RDW: 18.6 % — ABNORMAL HIGH (ref 11.2–14.5)
WBC: 8.3 10*3/uL (ref 3.9–10.3)

## 2018-06-23 LAB — FERRITIN: FERRITIN: 850 ng/mL — AB (ref 11–307)

## 2018-06-23 LAB — SAMPLE TO BLOOD BANK

## 2018-06-23 MED ORDER — SODIUM CHLORIDE 0.9 % IV SOLN
750.0000 mg | Freq: Once | INTRAVENOUS | Status: AC
  Administered 2018-06-23: 750 mg via INTRAVENOUS
  Filled 2018-06-23: qty 15

## 2018-06-23 MED ORDER — SODIUM CHLORIDE 0.9 % IV SOLN
Freq: Once | INTRAVENOUS | Status: AC
  Administered 2018-06-23: 12:00:00 via INTRAVENOUS
  Filled 2018-06-23: qty 250

## 2018-06-23 NOTE — Telephone Encounter (Signed)
Dr. [name] made aware of patient's potassium level of 6.2

## 2018-06-23 NOTE — Patient Instructions (Signed)
Ferric carboxymaltose injection What is this medicine? FERRIC CARBOXYMALTOSE (ferr-ik car-box-ee-mol-toes) is an iron complex. Iron is used to make healthy red blood cells, which carry oxygen and nutrients throughout the body. This medicine is used to treat anemia in people with chronic kidney disease or people who cannot take iron by mouth. This medicine may be used for other purposes; ask your health care provider or pharmacist if you have questions. COMMON BRAND NAME(S): Injectafer What should I tell my health care provider before I take this medicine? They need to know if you have any of these conditions: -anemia not caused by low iron levels -high levels of iron in the blood -liver disease -an unusual or allergic reaction to iron, other medicines, foods, dyes, or preservatives -pregnant or trying to get pregnant -breast-feeding How should I use this medicine? This medicine is for infusion into a vein. It is given by a health care professional in a hospital or clinic setting. Talk to your pediatrician regarding the use of this medicine in children. Special care may be needed. Overdosage: If you think you have taken too much of this medicine contact a poison control center or emergency room at once. NOTE: This medicine is only for you. Do not share this medicine with others. What if I miss a dose? It is important not to miss your dose. Call your doctor or health care professional if you are unable to keep an appointment. What may interact with this medicine? Do not take this medicine with any of the following medications: -deferoxamine -dimercaprol -other iron products This medicine may also interact with the following medications: -chloramphenicol -deferasirox This list may not describe all possible interactions. Give your health care provider a list of all the medicines, herbs, non-prescription drugs, or dietary supplements you use. Also tell them if you smoke, drink alcohol, or use  illegal drugs. Some items may interact with your medicine. What should I watch for while using this medicine? Visit your doctor or health care professional regularly. Tell your doctor if your symptoms do not start to get better or if they get worse. You may need blood work done while you are taking this medicine. You may need to follow a special diet. Talk to your doctor. Foods that contain iron include: whole grains/cereals, dried fruits, beans, or peas, leafy green vegetables, and organ meats (liver, kidney). What side effects may I notice from receiving this medicine? Side effects that you should report to your doctor or health care professional as soon as possible: -allergic reactions like skin rash, itching or hives, swelling of the face, lips, or tongue -breathing problems -changes in blood pressure -feeling faint or lightheaded, falls -flushing, sweating, or hot feelings Side effects that usually do not require medical attention (report to your doctor or health care professional if they continue or are bothersome): -changes in taste -constipation -dizziness -headache -nausea -pain, redness, or irritation at site where injected -vomiting This list may not describe all possible side effects. Call your doctor for medical advice about side effects. You may report side effects to FDA at 1-800-FDA-1088. Where should I keep my medicine? This drug is given in a hospital or clinic and will not be stored at home. NOTE: This sheet is a summary. It may not cover all possible information. If you have questions about this medicine, talk to your doctor, pharmacist, or health care provider.  2018 Elsevier/Gold Standard (2015-10-09 11:20:47)  

## 2018-06-23 NOTE — Telephone Encounter (Signed)
Per Dr. [name] informed patient to start taking Calcium 1,000 mg by mouth twice daily and Vitamin D 2,000 units daily. Also informed patient that Dr. [name] wants her to follow up with her PCP in 7-10 days. Patient verbalized understanding.

## 2018-06-23 NOTE — Telephone Encounter (Signed)
Appts scheduled AVS/Calendar printed per 10/4 los °

## 2018-07-06 ENCOUNTER — Other Ambulatory Visit: Payer: Medicare HMO

## 2018-07-12 ENCOUNTER — Other Ambulatory Visit: Payer: Self-pay | Admitting: Family Medicine

## 2018-07-22 ENCOUNTER — Encounter (HOSPITAL_COMMUNITY): Payer: Self-pay | Admitting: Emergency Medicine

## 2018-07-22 ENCOUNTER — Emergency Department (HOSPITAL_COMMUNITY): Payer: Medicare HMO

## 2018-07-22 ENCOUNTER — Observation Stay (HOSPITAL_COMMUNITY)
Admission: EM | Admit: 2018-07-22 | Discharge: 2018-07-25 | Disposition: A | Discharge status: Home / Self Care | Payer: Medicare HMO | Attending: Internal Medicine | Admitting: Internal Medicine

## 2018-07-22 ENCOUNTER — Other Ambulatory Visit: Payer: Self-pay

## 2018-07-22 DIAGNOSIS — K5520 Angiodysplasia of colon without hemorrhage: Secondary | ICD-10-CM | POA: Insufficient documentation

## 2018-07-22 DIAGNOSIS — Z79899 Other long term (current) drug therapy: Secondary | ICD-10-CM | POA: Diagnosis not present

## 2018-07-22 DIAGNOSIS — Z79890 Hormone replacement therapy (postmenopausal): Secondary | ICD-10-CM | POA: Diagnosis not present

## 2018-07-22 DIAGNOSIS — E7800 Pure hypercholesterolemia, unspecified: Secondary | ICD-10-CM | POA: Insufficient documentation

## 2018-07-22 DIAGNOSIS — E119 Type 2 diabetes mellitus without complications: Secondary | ICD-10-CM | POA: Diagnosis not present

## 2018-07-22 DIAGNOSIS — K219 Gastro-esophageal reflux disease without esophagitis: Secondary | ICD-10-CM | POA: Insufficient documentation

## 2018-07-22 DIAGNOSIS — E039 Hypothyroidism, unspecified: Secondary | ICD-10-CM | POA: Insufficient documentation

## 2018-07-22 DIAGNOSIS — I110 Hypertensive heart disease with heart failure: Secondary | ICD-10-CM | POA: Insufficient documentation

## 2018-07-22 DIAGNOSIS — I2510 Atherosclerotic heart disease of native coronary artery without angina pectoris: Secondary | ICD-10-CM | POA: Insufficient documentation

## 2018-07-22 DIAGNOSIS — E669 Obesity, unspecified: Secondary | ICD-10-CM | POA: Diagnosis not present

## 2018-07-22 DIAGNOSIS — D649 Anemia, unspecified: Secondary | ICD-10-CM

## 2018-07-22 DIAGNOSIS — Z7984 Long term (current) use of oral hypoglycemic drugs: Secondary | ICD-10-CM | POA: Insufficient documentation

## 2018-07-22 DIAGNOSIS — E785 Hyperlipidemia, unspecified: Secondary | ICD-10-CM | POA: Insufficient documentation

## 2018-07-22 DIAGNOSIS — I428 Other cardiomyopathies: Secondary | ICD-10-CM

## 2018-07-22 DIAGNOSIS — Z6826 Body mass index (BMI) 26.0-26.9, adult: Secondary | ICD-10-CM | POA: Insufficient documentation

## 2018-07-22 DIAGNOSIS — E66811 Obesity, class 1: Secondary | ICD-10-CM | POA: Diagnosis present

## 2018-07-22 DIAGNOSIS — E1159 Type 2 diabetes mellitus with other circulatory complications: Secondary | ICD-10-CM | POA: Diagnosis present

## 2018-07-22 DIAGNOSIS — E118 Type 2 diabetes mellitus with unspecified complications: Secondary | ICD-10-CM | POA: Diagnosis present

## 2018-07-22 DIAGNOSIS — I5022 Chronic systolic (congestive) heart failure: Secondary | ICD-10-CM | POA: Insufficient documentation

## 2018-07-22 DIAGNOSIS — D62 Acute posthemorrhagic anemia: Principal | ICD-10-CM

## 2018-07-22 DIAGNOSIS — R531 Weakness: Secondary | ICD-10-CM | POA: Diagnosis not present

## 2018-07-22 DIAGNOSIS — D500 Iron deficiency anemia secondary to blood loss (chronic): Secondary | ICD-10-CM | POA: Diagnosis present

## 2018-07-22 DIAGNOSIS — K5730 Diverticulosis of large intestine without perforation or abscess without bleeding: Secondary | ICD-10-CM | POA: Diagnosis not present

## 2018-07-22 DIAGNOSIS — Z96653 Presence of artificial knee joint, bilateral: Secondary | ICD-10-CM | POA: Diagnosis not present

## 2018-07-22 DIAGNOSIS — K2951 Unspecified chronic gastritis with bleeding: Secondary | ICD-10-CM | POA: Diagnosis not present

## 2018-07-22 DIAGNOSIS — I10 Essential (primary) hypertension: Secondary | ICD-10-CM | POA: Diagnosis not present

## 2018-07-22 DIAGNOSIS — E1169 Type 2 diabetes mellitus with other specified complication: Secondary | ICD-10-CM | POA: Diagnosis present

## 2018-07-22 LAB — IRON AND TIBC
Iron: 38 ug/dL (ref 28–170)
Saturation Ratios: 20 % (ref 10.4–31.8)
TIBC: 189 ug/dL — ABNORMAL LOW (ref 250–450)
UIBC: 151 ug/dL

## 2018-07-22 LAB — BASIC METABOLIC PANEL
Anion gap: 8 (ref 5–15)
BUN: 15 mg/dL (ref 8–23)
CHLORIDE: 113 mmol/L — AB (ref 98–111)
CO2: 24 mmol/L (ref 22–32)
CREATININE: 0.96 mg/dL (ref 0.44–1.00)
Calcium: 4.8 mg/dL — CL (ref 8.9–10.3)
GFR calc non Af Amer: 54 mL/min — ABNORMAL LOW (ref >60)
GLUCOSE: 140 mg/dL — AB (ref 70–99)
Potassium: 4.5 mmol/L (ref 3.5–5.1)
SODIUM: 145 mmol/L (ref 135–145)

## 2018-07-22 LAB — HEPATIC FUNCTION PANEL
ALT: 6 U/L (ref 0–44)
AST: 18 U/L (ref 15–41)
Albumin: 2.8 g/dL — ABNORMAL LOW (ref 3.5–5.0)
Alkaline Phosphatase: 85 U/L (ref 38–126)
Bilirubin, Direct: <0.1 mg/dL (ref 0.0–0.2)
Total Bilirubin: 0.6 mg/dL (ref 0.3–1.2)
Total Protein: 5.7 g/dL — ABNORMAL LOW (ref 6.5–8.1)

## 2018-07-22 LAB — CBC
HEMATOCRIT: 20.7 % — AB (ref 36.0–46.0)
Hemoglobin: 5.8 g/dL — CL (ref 12.0–15.0)
MCH: 30.4 pg (ref 26.0–34.0)
MCHC: 28 g/dL — ABNORMAL LOW (ref 30.0–36.0)
MCV: 108.4 fL — AB (ref 80.0–100.0)
NRBC: 0.6 % — AB (ref 0.0–0.2)
Platelets: 483 10*3/uL — ABNORMAL HIGH (ref 150–400)
RBC: 1.91 MIL/uL — AB (ref 3.87–5.11)
RDW: 18.8 % — AB (ref 11.5–15.5)
WBC: 11.6 10*3/uL — ABNORMAL HIGH (ref 4.0–10.5)

## 2018-07-22 LAB — FOLATE: Folate: 9.2 ng/mL (ref >5.9)

## 2018-07-22 LAB — BRAIN NATRIURETIC PEPTIDE: B Natriuretic Peptide: 189.4 pg/mL — ABNORMAL HIGH (ref 0.0–100.0)

## 2018-07-22 LAB — I-STAT TROPONIN, ED: Troponin i, poc: 0.01 ng/mL (ref 0.00–0.08)

## 2018-07-22 LAB — POC OCCULT BLOOD, ED: Fecal Occult Bld: POSITIVE — AB

## 2018-07-22 LAB — PREPARE RBC (CROSSMATCH)

## 2018-07-22 LAB — GLUCOSE, CAPILLARY: Glucose-Capillary: 86 mg/dL (ref 70–99)

## 2018-07-22 LAB — RETICULOCYTES
Immature Retic Fract: 34.3 % — ABNORMAL HIGH (ref 2.3–15.9)
RBC.: 1.71 MIL/uL — ABNORMAL LOW (ref 3.87–5.11)
Retic Count, Absolute: 124.3 10*3/uL (ref 19.0–186.0)
Retic Ct Pct: 7.3 % — ABNORMAL HIGH (ref 0.4–3.1)

## 2018-07-22 LAB — FERRITIN: Ferritin: 681 ng/mL — ABNORMAL HIGH (ref 11–307)

## 2018-07-22 LAB — VITAMIN B12: Vitamin B-12: 297 pg/mL (ref 180–914)

## 2018-07-22 MED ORDER — FUROSEMIDE 10 MG/ML IJ SOLN
40.0000 mg | Freq: Every day | INTRAMUSCULAR | Status: DC
  Administered 2018-07-22 – 2018-07-23 (×2): 40 mg via INTRAVENOUS
  Filled 2018-07-22 (×2): qty 4

## 2018-07-22 MED ORDER — ACETAMINOPHEN 325 MG PO TABS: 650.0000 mg | ORAL_TABLET | Freq: Four times a day (QID) | ORAL | Status: DC | PRN

## 2018-07-22 MED ORDER — SODIUM CHLORIDE 0.9 % IV SOLN
1.0000 g | Freq: Once | INTRAVENOUS | Status: DC
  Filled 2018-07-22: qty 10

## 2018-07-22 MED ORDER — HYDROCODONE-ACETAMINOPHEN 5-325 MG PO TABS
1.0000 | ORAL_TABLET | Freq: Four times a day (QID) | ORAL | Status: DC | PRN
  Administered 2018-07-22: 1 via ORAL
  Filled 2018-07-22: qty 1

## 2018-07-22 MED ORDER — ACETAMINOPHEN 650 MG RE SUPP: 650.0000 mg | Freq: Four times a day (QID) | RECTAL | Status: DC | PRN

## 2018-07-22 MED ORDER — SACUBITRIL-VALSARTAN 49-51 MG PO TABS
1.0000 | ORAL_TABLET | Freq: Two times a day (BID) | ORAL | Status: DC
  Administered 2018-07-22 – 2018-07-25 (×5): 1 via ORAL
  Filled 2018-07-22 (×6): qty 1

## 2018-07-22 MED ORDER — PANTOPRAZOLE SODIUM 40 MG IV SOLR
40.0000 mg | INTRAVENOUS | Status: DC
  Administered 2018-07-23 – 2018-07-25 (×3): 40 mg via INTRAVENOUS
  Filled 2018-07-22 (×3): qty 40

## 2018-07-22 MED ORDER — ATORVASTATIN CALCIUM 40 MG PO TABS
40.0000 mg | ORAL_TABLET | Freq: Every day | ORAL | Status: DC
  Administered 2018-07-22 – 2018-07-24 (×3): 40 mg via ORAL
  Filled 2018-07-22 (×3): qty 1

## 2018-07-22 MED ORDER — CALCIUM GLUCONATE-NACL 1-0.675 GM/50ML-% IV SOLN
1.0000 g | Freq: Once | INTRAVENOUS | Status: AC
  Administered 2018-07-22: 1000 mg via INTRAVENOUS
  Filled 2018-07-22: qty 50

## 2018-07-22 MED ORDER — LEVOTHYROXINE SODIUM 25 MCG PO TABS
125.0000 ug | ORAL_TABLET | Freq: Every day | ORAL | Status: DC
  Administered 2018-07-23 – 2018-07-25 (×3): 125 ug via ORAL
  Filled 2018-07-22 (×3): qty 1

## 2018-07-22 MED ORDER — CARVEDILOL 12.5 MG PO TABS
12.5000 mg | ORAL_TABLET | Freq: Two times a day (BID) | ORAL | Status: DC
  Administered 2018-07-23 – 2018-07-24 (×3): 12.5 mg via ORAL
  Filled 2018-07-22 (×5): qty 1

## 2018-07-22 MED ORDER — INSULIN ASPART 100 UNIT/ML ~~LOC~~ SOLN: 0.0000 [IU] | Freq: Every day | SUBCUTANEOUS | Status: DC

## 2018-07-22 MED ORDER — INSULIN ASPART 100 UNIT/ML ~~LOC~~ SOLN: 0.0000 [IU] | Freq: Three times a day (TID) | SUBCUTANEOUS | Status: DC

## 2018-07-22 MED ORDER — SODIUM CHLORIDE 0.9 % IV SOLN
10.0000 mL/h | Freq: Once | INTRAVENOUS | Status: AC
  Administered 2018-07-22: 10 mL/h via INTRAVENOUS

## 2018-07-22 MED ORDER — PANTOPRAZOLE SODIUM 40 MG IV SOLR
40.0000 mg | Freq: Once | INTRAVENOUS | Status: AC
  Administered 2018-07-22: 40 mg via INTRAVENOUS
  Filled 2018-07-22: qty 40

## 2018-07-22 MED ORDER — AMLODIPINE BESYLATE 5 MG PO TABS
10.0000 mg | ORAL_TABLET | Freq: Every day | ORAL | Status: DC
  Administered 2018-07-22 – 2018-07-23 (×2): 10 mg via ORAL
  Filled 2018-07-22 (×2): qty 2

## 2018-07-22 NOTE — ED Notes (Signed)
PT AND SON UPDATED ON ROOM STATUS

## 2018-07-22 NOTE — H&P (Addendum)
History and Physical    Connie [name] ESL:[id number] DOB: [birth year] DOA: 07/22/2018  PCP: [name], MD  Patient coming from: Home  Chief Complaint: Weakness  HPI: Connie [name] is a 84 y.o. female with medical history significant of nonischemic cardiomyopathy, hx chronic iron deficiency anemia requiring blood transfusions, HTN, HLD, obesity, DM2 who presents to the ED with complaints of increased weakness and LE swelling over the past several days prior to ED visit, associated with subjective sob on minimal exertion as well as some lightheadedness.   On further questioning, patient reports feeling gradual weakness starting around 10/30, progressively worsening over the subsequent days. Patient reports decreased vision thus unsure if she saw black or red stools. Patient also noted to have markedly worsened BLE edema over the past several days.  ED Course: In the ED, patient was noted to have hgb of 5.8 with hgb of 4.8. Two units of PRBC's were ordered in ED and patient did receive one dose of calcium gluconate. Patient was found to be heme positive and GI was called, who recommended medical admission with plans for formal GI consultation in AM. Hospitalist consulted for consideration for medical admission.  Review of Systems:  Review of Systems  Constitutional: Positive for malaise/fatigue. Negative for chills and fever.  HENT: Negative for congestion, ear discharge, ear pain and nosebleeds.   Eyes: Negative for photophobia, pain and discharge.  Respiratory: Positive for shortness of breath. Negative for hemoptysis, sputum production and wheezing.   Cardiovascular: Negative for palpitations, orthopnea, claudication and leg swelling.  Gastrointestinal: Negative for constipation, diarrhea and vomiting.  Genitourinary: Negative for frequency, hematuria and urgency.  Musculoskeletal: Negative for back pain, joint pain and neck pain.  Neurological: Negative for tingling,  tremors, seizures, loss of consciousness and weakness.  Psychiatric/Behavioral: Negative for hallucinations and memory loss. The patient is not nervous/anxious.     Past Medical History:  Diagnosis Date  . Allergy    RHINITIS  . Angiodysplasia of duodenum    hx/notes 11/21/2015  . Angiodysplasia of stomach    hx/notes 11/21/2015  . Arthritis    "joints ache"  . Bleeding gastric ulcer    hx/notes 11/21/2015  . Chronic blood loss anemia    secondary to angiodysplasia of the stomach and duodenum as well as history of bleeding gastric ulcer hx/notes 11/21/2015  . Chronic systolic CHF (congestive heart failure) ([place]) 12/23/2015  . History of blood transfusion 01/2015; 06/2015; 11/21/2015  . Hypercholesteremia   . Hypertension   . Hypothyroidism   . Obesity   . Pneumonia "several times"  . Type II diabetes mellitus ([place])     Past Surgical History:  Procedure Laterality Date  . CARDIAC CATHETERIZATION N/A 12/19/2015   Procedure: Left Heart Cath and Coronary Angiography;  Surgeon: Leonie Man, MD;  Location: [facility];  Service: Cardiovascular;  Laterality: N/A;  . COLONOSCOPY N/A 08/30/2014   Procedure: COLONOSCOPY;  Surgeon: Beryle Beams, MD;  Location: [facility];  Service: Endoscopy;  Laterality: N/A;  . COLONOSCOPY N/A 02/21/2015   Procedure: COLONOSCOPY;  Surgeon: Carol Ada, MD;  Location: [facility]OSCOPY;  Service: Endoscopy;  Laterality: N/A;  . ENTEROSCOPY N/A 06/06/2015   Procedure: ENTEROSCOPY;  Surgeon: Carol Ada, MD;  Location: [facility];  Service: Endoscopy;  Laterality: N/A;  . FRACTURE SURGERY    . GIVENS CAPSULE STUDY N/A 05/15/2015   Procedure: GIVENS CAPSULE STUDY;  Surgeon: Carol Ada, MD;  Location: [facility] END[facility];  Service: Endoscopy;  Laterality: N/A;  .  HOT HEMOSTASIS N/A 08/30/2014   Procedure: HOT HEMOSTASIS (ARGON PLASMA COAGULATION/BICAP);  Surgeon: Beryle Beams, MD;  Location: [facility];  Service: Endoscopy;  Laterality: N/A;  . HOT HEMOSTASIS  N/A 06/06/2015   Procedure: HOT HEMOSTASIS (ARGON PLASMA COAGULATION/BICAP);  Surgeon: Carol Ada, MD;  Location: [facility];  Service: Endoscopy;  Laterality: N/A;  . JOINT REPLACEMENT    . REVISION TOTAL KNEE ARTHROPLASTY Bilateral 2004-2015  . SHOULDER OPEN ROTATOR CUFF REPAIR Right 12/2004   open subacromial decompression, distal clavicle  resection, rotator cuff repair/notes 02/02/2011  . TONSILLECTOMY  1940s  . TOTAL KNEE ARTHROPLASTY Bilateral ~ 2002  . TOTAL THYROIDECTOMY  ~ 1966     reports that she has never smoked. She has never used smokeless tobacco. She reports that she does not drink alcohol or use drugs.  No Known Allergies  Family History  Problem Relation Age of Onset  . Asthma Mother   . Ulcers Father   . Anemia Sister     Prior to Admission medications   Medication Sig Start Date End Date Taking? Authorizing Provider  ACCU-CHEK FASTCLIX LANCETS MISC 1 each by Does not apply route 2 (two) times daily. DX E11.9 04/26/17   [name], MD  acetaminophen (TYLENOL) 500 MG tablet Take 500 mg by mouth every 6 (six) hours as needed.    [provider]  amLODipine (NORVASC) 10 MG tablet TAKE 1 TABLET (10 MG TOTAL) BY MOUTH DAILY. 03/14/18   [name], MD  atorvastatin (LIPITOR) 40 MG tablet TAKE 1 TABLET EVERY DAY 10/31/17   [name], MD  Blood Glucose Monitoring Suppl (ACCU-CHEK GUIDE) w/Device KIT 1 each by Does not apply route 2 (two) times daily. DX E11.9 04/26/17   [name], MD  calcitonin, salmon, (MIACALCIN) 200 UNIT/ACT nasal spray Place 1 spray into alternate nostrils daily. 05/12/18   [name], MD  Calcium Carb-Cholecalciferol (CALCIUM 1000 + D PO) Take 1 tablet by mouth.    [provider]  carvedilol (COREG) 12.5 MG tablet TAKE 1 TABLET (12.5 MG TOTAL) BY MOUTH 2 (TWO) TIMES DAILY WITH A MEAL. 12/12/17   Jerline Pain, MD  furosemide (LASIX) 40 MG tablet TAKE 1 TABLET (40 MG TOTAL) BY MOUTH DAILY. 03/20/18   [name], MD  glucose blood (ACCU-CHEK GUIDE) test strip Patient to test 2 times daily DX E11.9 Use as instructed 04/26/17   [name], MD  HYDROcodone-acetaminophen (NORCO) 5-325 MG tablet Take 1 tablet by mouth every 6 (six) hours as needed for moderate pain. 06/14/18   [name], MD  Lancets Misc. (ACCU-CHEK FASTCLIX LANCET) KIT 1 each by Does not apply route 2 (two) times daily. DX E11.9 04/26/17   [name], MD  levothyroxine (SYNTHROID, LEVOTHROID) 125 MCG tablet TAKE 1 TABLET EVERY DAY 03/14/18   [name], MD  Multiple Vitamins-Minerals (WOMENS MULTIVITAMIN PLUS PO) Take 1 tablet by mouth daily. 01/15/16   [provider]  pantoprazole (PROTONIX) 20 MG tablet TAKE 1 TABLET (20 MG TOTAL) BY MOUTH DAILY. Patient not taking: Reported on 04/27/2018 03/14/18   [name], MD  pioglitazone (ACTOS) 30 MG tablet TAKE 1 TABLET (30 MG TOTAL) BY MOUTH DAILY. 03/14/18   [name], MD  potassium chloride (K-DUR) 10 MEQ tablet Take 1 tablet (10 mEq total) by mouth as directed. Take 10 meq twice a day every Mon, Emmie Niemann and Fri's 08/04/16 04/21/17  [name] T, PA-C  potassium  chloride (K-DUR,KLOR-CON) 10 MEQ tablet TAKE 1 TABLET TWICE DAILY 07/13/18   [name], MD  sacubitril-valsartan (ENTRESTO) 49-51 MG Take 1 tablet by mouth 2 (two) times daily. 02/27/18   Jerline Pain, MD    Physical Exam: Vitals:   07/22/18 1730 07/22/18 1735 07/22/18 1801 07/22/18 1814  BP: 122/73 122/73 136/65 (!) 142/43  Pulse: 64 67 69 70  Resp: '14 15 18 18  ' Temp:   98.4 F (36.9 C)   TempSrc:   Oral   SpO2: 99% 100% 98% 98%  Weight:      Height:        Constitutional: NAD, calm, comfortable Vitals:   07/22/18 1730 07/22/18 1735 07/22/18 1801 07/22/18 1814  BP: 122/73 122/73 136/65 (!) 142/43  Pulse: 64 67 69 70  Resp: '14 15 18 18  ' Temp:   98.4 F (36.9 C)   TempSrc:   Oral   SpO2: 99% 100% 98% 98%  Weight:      Height:       Eyes: PERRL, lids and conjunctivae normal ENMT:  Mucous membranes are moist. Posterior pharynx clear of any exudate or lesions.Normal dentition.  Neck: normal, supple, no masses, no thyromegaly Respiratory: clear to auscultation bilaterally, no wheezing, no crackles. Normal respiratory effort. No accessory muscle use.  Cardiovascular: Regular rate and rhythm  Abdomen: no tenderness, no masses palpated. Bowel sounds positive.  Musculoskeletal: no clubbing / cyanosis. BLE edema Skin: no rashes, lesions, ulcers. No induration Neurologic: CN 2-12 grossly intact. Sensation intact, DTR normal. Strength 5/5 in all 4.  Psychiatric: Normal judgment and insight. Alert and oriented x 3. Normal mood.    Labs on Admission: I have personally reviewed following labs and imaging studies  CBC: Recent Labs  Lab 07/22/18 1359  WBC 11.6*  HGB 5.8*  HCT 20.7*  MCV 108.4*  PLT 595*   Basic Metabolic Panel: Recent Labs  Lab 07/22/18 1359  NA 145  K 4.5  CL 113*  CO2 24  GLUCOSE 140*  BUN 15  CREATININE 0.96  CALCIUM 4.8*   GFR: Estimated Creatinine Clearance: 30.5 mL/min (by C-G formula based on SCr of 0.96 mg/dL). Liver Function Tests: Recent Labs  Lab 07/22/18 1359  AST 18  ALT 6  ALKPHOS 85  BILITOT 0.6  PROT 5.7*  ALBUMIN 2.8*   No results for input(s): LIPASE, AMYLASE in the last 168 hours. No results for input(s): AMMONIA in the last 168 hours. Coagulation Profile: No results for input(s): INR, PROTIME in the last 168 hours. Cardiac Enzymes: No results for input(s): CKTOTAL, CKMB, CKMBINDEX, TROPONINI in the last 168 hours. BNP (last 3 results) No results for input(s): PROBNP in the last 8760 hours. HbA1C: No results for input(s): HGBA1C in the last 72 hours. CBG: No results for input(s): GLUCAP in the last 168 hours. Lipid Profile: No results for input(s): CHOL, HDL, LDLCALC, TRIG, CHOLHDL, LDLDIRECT in the last 72 hours. Thyroid Function Tests: No results for input(s): TSH, T4TOTAL, FREET4, T3FREE, THYROIDAB in the  last 72 hours. Anemia Panel: Recent Labs    07/22/18 1622 07/22/18 1650  VITAMINB12 297  --   FOLATE 9.2  --   FERRITIN 681*  --   TIBC 189*  --   IRON 38  --   RETICCTPCT  --  7.3*   Urine analysis:    Component Value Date/Time   COLORURINE YELLOW 12/16/2015 2347   APPEARANCEUR CLEAR 12/16/2015 2347   LABSPEC 1.012 12/16/2015 2347   PHURINE 5.0 12/16/2015  2347   GLUCOSEU 100 (A) 12/16/2015 2347   HGBUR TRACE (A) 12/16/2015 2347   BILIRUBINUR NEGATIVE 12/16/2015 2347   BILIRUBINUR n 05/09/2015 [place] 12/16/2015 2347   PROTEINUR 30 (A) 12/16/2015 2347   UROBILINOGEN negative 05/09/2015 1656   UROBILINOGEN 0.2 02/19/2015 1823   NITRITE NEGATIVE 12/16/2015 2347   LEUKOCYTESUR NEGATIVE 12/16/2015 2347   Sepsis Labs: !!!!!!!!!!!!!!!!!!!!!!!!!!!!!!!!!!!!!!!!!!!! '@LABRCNTIP' (procalcitonin:4,lacticidven:4) )No results found for this or any previous visit (from the past 240 hour(s)).   Radiological Exams on Admission: Dg Chest 2 View  Result Date: 07/22/2018 CLINICAL DATA:  Pt with bilateral lower extremity edema that has been worsening over the past week, pt with hx of CHF. Pt also c/o weakness and intermittent confusion, pt states she is receiving blood transfusions and iron for low hgb, last tran.*comment was truncated*hx chf, weakness EXAM: CHEST - 2 VIEW COMPARISON:  Radiograph 10/21/2016 FINDINGS: Normal cardiac silhouette. Levo kyphosis of the spine lungs are clear. No effusion, infiltrate pneumothorax. IMPRESSION: No acute cardiopulmonary process. Electronically Signed   By: Suzy Bouchard M.D.   On: 07/22/2018 14:54    EKG: Independently reviewed. Normal sinus with motion artifact  Assessment/Plan Principal Problem:   Acute blood loss anemia Active Problems:   Hypertension associated with diabetes ([place])   Hyperlipidemia associated with type 2 diabetes mellitus ([name])   Obesity (BMI 30.0-34.9)   Type 2 diabetes with complication ([name])   Iron  deficiency anemia due to chronic blood loss   NICM (nonischemic cardiomyopathy) ([place])   1. Acute blood loss anemia 1. Presenting hgb in the 5 range associated with increased weakness 2. Prior hx of gastric and duodenal AVM's per chart review 3. In ED, pt found to be heme positive 4. GI consulted in ED with recommendations for medical admission and formal GI consult in AM 5. Allow clears overnight, NPO after midnight 6. Continue on IV PPI 7. Repeat CBC in AM 8. 2 units PRBC's ordered in ED 2. HTN 1. BP currently stable and controlled 2. Continue home meds as tolerated 3. DM2 1. Will continue SSI coverage as needed 2. On actos prior to admit, will hold while in hospital 4. Hypocalcemia 1. Ca noted to be in the 4 range 2. Caclium gluconate ordered in ED 3. Repeat lytes in AM 5. Obesity 1. Seems stable at this time 2. Would recommend diet/lifestyle modification 6. Fe deficiency anemia 1. Blood transfusion per above 2. Repeat CBC in AM 7. Nonischemic cardiomyopathy 1. Increased BLE edema over baseline 2. Will transition patient to IV lasix daily 3. Will repeat bmet in AM 4. Follow I/O and daily weights  DVT prophylaxis: SCD's  Code Status: Full Family Communication: Pt in room  Disposition Plan: Uncertain at this time  Consults called: GI, Dr. Havery Moros Admission status: Observation as would likely require less than two midnight stay to stabilize hgb with blood tx and GI work up   Marylu Lund MD [facility] Pager On [place]  If 7PM-7AM, please contact night-coverage  07/22/2018, 6:24 PM

## 2018-07-22 NOTE — ED Notes (Signed)
Date and time results received: 07/22/18  (use smartphrase ".now" to insert current time)  Test: HGB  Critical Value: 5.8  Name of Provider Notified: EDPA MINA FAWZA  Orders Received? Or Actions Taken?: AWAITING RESPONSE

## 2018-07-22 NOTE — ED Notes (Signed)
Date and time results received: 07/22/18  (use smartphrase ".now" to insert current time)  Test: CALCIUM Critical Value: 4.8  Name of Provider Notified: EDPA MINA  Orders Received? Or Actions Taken?: AWAITING RESPONSE

## 2018-07-22 NOTE — ED Notes (Signed)
ED Provider at bedside. 

## 2018-07-22 NOTE — ED Notes (Signed)
UNABLE TO USE COMPUTER CONSENT. REQUESTED FOR REPAIR. PT AND SON PRESENT TO WITNESS.

## 2018-07-22 NOTE — ED Notes (Signed)
ADMITTING Provider at bedside. 

## 2018-07-22 NOTE — ED Notes (Signed)
EDPA Provider at bedside. 

## 2018-07-22 NOTE — ED Triage Notes (Signed)
Pt with bilateral lower extremity edema that has been worsening over the past week, pt with hx of CHF. Pt also c/o weakness and intermittent confusion, pt states she is receiving blood transfusions and iron for low hgb, last transfusion 2-3 months ago.

## 2018-07-22 NOTE — ED Notes (Signed)
OTHER BLOOD COLLECTED AND DOWN IN LAB. PLEASE CALL AND VERIFY

## 2018-07-22 NOTE — ED Provider Notes (Addendum)
[facility] DEPT Provider Note   CSN: [id number] Arrival date & time: 07/22/18  1238     History   Chief Complaint Chief Complaint  Patient presents with  . Weakness  . Leg Swelling    HPI Connie [name] is a 84 y.o. female with history of bleeding gastric ulcer, chronic blood loss anemia, chronic systolic CHF, HLD, HTN, obesity, type 2 diabetes mellitus presents for evaluation of acute onset, worsening bilateral lower extremity edema, generalized weakness and fatigue and shortness of breath for 3 days.  She notes that she has dyspnea on exertion ambulating even a short distance.  She notes orthopnea and PND last night.  Denies chest pain, abdominal pain, nausea, vomiting, diaphoresis.  Notes lightheadedness with position changes but no syncope.  She notes that she has dark stools but takes iron supplements.  Denies any frank rectal bleeding or urinary symptoms.  The history is provided by the patient.    Past Medical History:  Diagnosis Date  . Allergy    RHINITIS  . Angiodysplasia of duodenum    hx/notes 11/21/2015  . Angiodysplasia of stomach    hx/notes 11/21/2015  . Arthritis    "joints ache"  . Bleeding gastric ulcer    hx/notes 11/21/2015  . Chronic blood loss anemia    secondary to angiodysplasia of the stomach and duodenum as well as history of bleeding gastric ulcer hx/notes 11/21/2015  . Chronic systolic CHF (congestive heart failure) ([place]) 12/23/2015  . History of blood transfusion 01/2015; 06/2015; 11/21/2015  . Hypercholesteremia   . Hypertension   . Hypothyroidism   . Obesity   . Pneumonia "several times"  . Type II diabetes mellitus [facility])     Patient Active Problem List   Diagnosis Date Noted  . Gastroesophageal reflux disease 06/14/2018  . Osteoporosis 05/25/2018  . History of cataract extraction 10/19/2017  . Aortic atherosclerosis ([place]) 10/19/2016  . Long-term use of high-risk medication 08/04/2016  . Coronary artery  disease involving native heart without angina pectoris 08/03/2016  . NICM (nonischemic cardiomyopathy) ([place]) 08/03/2016  . Senile purpura ([place]) 02/02/2016  . Macular degeneration (senile) of retina 12/23/2015  . Chronic systolic CHF (congestive heart failure) ([place]) 12/23/2015  . Acute blood loss anemia 11/21/2015  . Angiodysplasia of stomach and duodenum with hemorrhage 06/24/2015  . Iron deficiency anemia due to chronic blood loss 06/24/2015  . Type 2 diabetes with complication ([place])   . Obesity (BMI 30.0-34.9) 07/25/2014  . Hypertension associated with diabetes ([place]) 03/15/2012  . Hypothyroidism 03/15/2012  . Hyperlipidemia associated with type 2 diabetes mellitus ([place]) 03/15/2012    Past Surgical History:  Procedure Laterality Date  . CARDIAC CATHETERIZATION N/A 12/19/2015   Procedure: Left Heart Cath and Coronary Angiography;  Surgeon: Leonie Man, MD;  Location: [facility];  Service: Cardiovascular;  Laterality: N/A;  . COLONOSCOPY N/A 08/30/2014   Procedure: COLONOSCOPY;  Surgeon: Beryle Beams, MD;  Location: [facility] ENDOSCOPY;  Service: Endoscopy;  Laterality: N/A;  . COLONOSCOPY N/A 02/21/2015   Procedure: COLONOSCOPY;  Surgeon: Carol Ada, MD;  Location: [facility] ENDOSCOPY;  Service: Endoscopy;  Laterality: N/A;  . ENTEROSCOPY N/A 06/06/2015   Procedure: ENTEROSCOPY;  Surgeon: Carol Ada, MD;  Location: [facility] ENDOSCOPY;  Service: Endoscopy;  Laterality: N/A;  . FRACTURE SURGERY    . GIVENS CAPSULE STUDY N/A 05/15/2015   Procedure: GIVENS CAPSULE STUDY;  Surgeon: Carol Ada, MD;  Location: [facility] ENDOSCOPY;  Service: Endoscopy;  Laterality: N/A;  . HOT HEMOSTASIS N/A  08/30/2014   Procedure: HOT HEMOSTASIS (ARGON PLASMA COAGULATION/BICAP);  Surgeon: Beryle Beams, MD;  Location: [facility];  Service: Endoscopy;  Laterality: N/A;  . HOT HEMOSTASIS N/A 06/06/2015   Procedure: HOT HEMOSTASIS (ARGON PLASMA COAGULATION/BICAP);  Surgeon: Carol Ada, MD;  Location: [facility] ENDOSCOPY;   Service: Endoscopy;  Laterality: N/A;  . JOINT REPLACEMENT    . REVISION TOTAL KNEE ARTHROPLASTY Bilateral 2004-2015  . SHOULDER OPEN ROTATOR CUFF REPAIR Right 12/2004   open subacromial decompression, distal clavicle  resection, rotator cuff repair/notes 02/02/2011  . TONSILLECTOMY  1940s  . TOTAL KNEE ARTHROPLASTY Bilateral ~ 2002  . TOTAL THYROIDECTOMY  ~ 1966     OB History   None      Home Medications    Prior to Admission medications   Medication Sig Start Date End Date Taking? Authorizing Provider  ACCU-CHEK FASTCLIX LANCETS MISC 1 each by Does not apply route 2 (two) times daily. DX E11.9 04/26/17  Yes [name], MD  acetaminophen (TYLENOL) 500 MG tablet Take 500 mg by mouth every 6 (six) hours as needed for mild pain or headache.    Yes [provider]  amLODipine (NORVASC) 10 MG tablet TAKE 1 TABLET (10 MG TOTAL) BY MOUTH DAILY. Patient taking differently: Take 10 mg by mouth daily.  03/14/18  Yes [name], MD  atorvastatin (LIPITOR) 40 MG tablet TAKE 1 TABLET EVERY DAY 10/31/17  Yes [name], MD  Blood Glucose Monitoring Suppl (ACCU-CHEK GUIDE) w/Device KIT 1 each by Does not apply route 2 (two) times daily. DX E11.9 04/26/17  Yes [name], MD  calcitonin, salmon, (MIACALCIN) 200 UNIT/ACT nasal spray Place 1 spray into alternate nostrils daily. 05/12/18  Yes [name], MD  Calcium Carb-Cholecalciferol (CALCIUM 1000 + D PO) Take 1 tablet by mouth.   Yes [provider]  carvedilol (COREG) 12.5 MG tablet TAKE 1 TABLET (12.5 MG TOTAL) BY MOUTH 2 (TWO) TIMES DAILY WITH A MEAL. 12/12/17  Yes Jerline Pain, MD  furosemide (LASIX) 40 MG tablet TAKE 1 TABLET (40 MG TOTAL) BY MOUTH DAILY. 03/20/18  Yes [name], MD  glucose blood (ACCU-CHEK GUIDE) test strip Patient to test 2 times daily DX E11.9 Use as instructed 04/26/17  Yes [name], MD  Lancets Misc. (ACCU-CHEK FASTCLIX LANCET) KIT 1 each by Does not apply route 2 (two) times  daily. DX E11.9 04/26/17  Yes [name], MD  levothyroxine (SYNTHROID, LEVOTHROID) 125 MCG tablet TAKE 1 TABLET EVERY DAY 03/14/18  Yes [name], MD  Multiple Vitamins-Minerals (WOMENS MULTIVITAMIN PLUS PO) Take 1 tablet by mouth daily. 01/15/16  Yes [provider]  pantoprazole (PROTONIX) 20 MG tablet TAKE 1 TABLET (20 MG TOTAL) BY MOUTH DAILY. 03/14/18  Yes [name], MD  pioglitazone (ACTOS) 30 MG tablet TAKE 1 TABLET (30 MG TOTAL) BY MOUTH DAILY. Patient taking differently: Take 30 mg by mouth daily.  03/14/18  Yes [name], MD  potassium chloride (K-DUR,KLOR-CON) 10 MEQ tablet TAKE 1 TABLET TWICE DAILY 07/13/18  Yes [name], MD  HYDROcodone-acetaminophen (NORCO) 5-325 MG tablet Take 1 tablet by mouth every 6 (six) hours as needed for moderate pain. Patient not taking: Reported on 07/22/2018 06/14/18   [name], MD  potassium chloride (K-DUR) 10 MEQ tablet Take 1 tablet (10 mEq total) by mouth as directed. Take 10 meq twice a day every Mon, Wed and Fri's Patient not taking: Reported on 07/22/2018  08/04/16 04/21/17  [name] T, PA-C  sacubitril-valsartan (ENTRESTO) 49-51 MG Take 1 tablet by mouth 2 (two) times daily. Patient not taking: Reported on 07/22/2018 02/27/18   Jerline Pain, MD    Family History Family History  Problem Relation Age of Onset  . Asthma Mother   . Ulcers Father   . Anemia Sister     Social History Social History   Tobacco Use  . Smoking status: Never Smoker  . Smokeless tobacco: Never Used  Substance Use Topics  . Alcohol use: No  . Drug use: No     Allergies   Patient has no known allergies.   Review of Systems Review of Systems  Constitutional: Positive for fatigue. Negative for chills and fever.  Respiratory: Positive for shortness of breath.   Cardiovascular: Positive for leg swelling. Negative for chest pain.  Gastrointestinal: Negative for nausea and vomiting.  Neurological: Positive for weakness  (generalized) and light-headedness.  All other systems reviewed and are negative.    Physical Exam Updated Vital Signs BP (!) 122/45   Pulse 62   Temp 98.6 F (37 C) (Oral)   Resp 15   Ht '4\' 8"'  (1.422 m)   Wt 52.2 kg   SpO2 98%   BMI 25.78 kg/m   Physical Exam  Constitutional: She appears well-developed and well-nourished. No distress.  HENT:  Head: Normocephalic and atraumatic.  Eyes: Conjunctivae are normal. Right eye exhibits no discharge. Left eye exhibits no discharge.  Neck: Normal range of motion. Neck supple. No JVD present. No tracheal deviation present.  Cardiovascular: Normal rate, regular rhythm and intact distal pulses.  Murmur heard. 3+ pitting edema of the bilateral lower extremities, 2+ radial and DP/PT pulses bilaterally, Homans sign absent bilaterally, no palpable cords, compartments are soft   Pulmonary/Chest: Effort normal. She exhibits no tenderness.  Bibasilar crackles  Abdominal: Soft. Bowel sounds are normal. She exhibits no distension and no mass. There is tenderness. There is no guarding.  Diffuse generalized mild discomfort on palpation with no focal tenderness  Genitourinary:  Genitourinary Comments: Examination performed in the presence of a chaperone.  No frank rectal bleeding. Dark brown stool noted. External hemorrhoid noted, nontender, does not appear thrombosed.   Musculoskeletal: She exhibits no edema.  Neurological: She is alert.  Skin: Skin is warm and dry. No erythema.  Psychiatric: She has a normal mood and affect. Her behavior is normal.  Nursing note and vitals reviewed.    ED Treatments / Results  Labs (all labs ordered are listed, but only abnormal results are displayed) Labs Reviewed  BASIC METABOLIC PANEL - Abnormal; Notable for the following components:      Result Value   Chloride 113 (*)    Glucose, Bld 140 (*)    Calcium 4.8 (*)    GFR calc non Af Amer 54 (*)    All other components within normal limits  CBC -  Abnormal; Notable for the following components:   WBC 11.6 (*)    RBC 1.91 (*)    Hemoglobin 5.8 (*)    HCT 20.7 (*)    MCV 108.4 (*)    MCHC 28.0 (*)    RDW 18.8 (*)    Platelets 483 (*)    nRBC 0.6 (*)    All other components within normal limits  BRAIN NATRIURETIC PEPTIDE - Abnormal; Notable for the following components:   B Natriuretic Peptide 189.4 (*)    All other components within normal limits  HEPATIC FUNCTION PANEL -  Abnormal; Notable for the following components:   Total Protein 5.7 (*)    Albumin 2.8 (*)    All other components within normal limits  IRON AND TIBC - Abnormal; Notable for the following components:   TIBC 189 (*)    All other components within normal limits  FERRITIN - Abnormal; Notable for the following components:   Ferritin 681 (*)    All other components within normal limits  RETICULOCYTES - Abnormal; Notable for the following components:   Retic Ct Pct 7.3 (*)    RBC. 1.71 (*)    Immature Retic Fract 34.3 (*)    All other components within normal limits  POC OCCULT BLOOD, ED - Abnormal; Notable for the following components:   Fecal Occult Bld POSITIVE (*)    All other components within normal limits  VITAMIN B12  FOLATE  COMPREHENSIVE METABOLIC PANEL  CBC  I-STAT TROPONIN, ED  TYPE AND SCREEN  PREPARE RBC (CROSSMATCH)    EKG None  Radiology Dg Chest 2 View  Result Date: 07/22/2018 CLINICAL DATA:  Pt with bilateral lower extremity edema that has been worsening over the past week, pt with hx of CHF. Pt also c/o weakness and intermittent confusion, pt states she is receiving blood transfusions and iron for low hgb, last tran.*comment was truncated*hx chf, weakness EXAM: CHEST - 2 VIEW COMPARISON:  Radiograph 10/21/2016 FINDINGS: Normal cardiac silhouette. Levo kyphosis of the spine lungs are clear. No effusion, infiltrate pneumothorax. IMPRESSION: No acute cardiopulmonary process. Electronically Signed   By: Suzy Bouchard M.D.   On:  07/22/2018 14:54    Procedures .Critical Care Performed by: Renita Papa, PA-C Authorized by: Renita Papa, PA-C   Critical care provider statement:    Critical care time (minutes):  35   Critical care was necessary to treat or prevent imminent or life-threatening deterioration of the following conditions:  Circulatory failure   Critical care was time spent personally by me on the following activities:  Discussions with consultants, evaluation of patient's response to treatment, examination of patient, ordering and performing treatments and interventions, ordering and review of laboratory studies, ordering and review of radiographic studies, re-evaluation of patient's condition, obtaining history from patient or surrogate and review of old charts   I assumed direction of critical care for this patient from another provider in my specialty: no     (including critical care time)  Medications Ordered in ED Medications  acetaminophen (TYLENOL) tablet 650 mg (has no administration in time range)    Or  acetaminophen (TYLENOL) suppository 650 mg (has no administration in time range)  insulin aspart (novoLOG) injection 0-15 Units (has no administration in time range)  insulin aspart (novoLOG) injection 0-5 Units (has no administration in time range)  furosemide (LASIX) injection 40 mg (has no administration in time range)  amLODipine (NORVASC) tablet 10 mg (has no administration in time range)  atorvastatin (LIPITOR) tablet 40 mg (has no administration in time range)  carvedilol (COREG) tablet 12.5 mg (has no administration in time range)  HYDROcodone-acetaminophen (NORCO/VICODIN) 5-325 MG per tablet 1 tablet (has no administration in time range)  levothyroxine (SYNTHROID, LEVOTHROID) tablet 125 mcg (has no administration in time range)  sacubitril-valsartan (ENTRESTO) 49-51 mg per tablet (has no administration in time range)  pantoprazole (PROTONIX) injection 40 mg (has no administration in  time range)  0.9 %  sodium chloride infusion (10 mL/hr Intravenous New Bag/Given (Non-Interop) 07/22/18 1648)  pantoprazole (PROTONIX) injection 40 mg (40 mg Intravenous Given 07/22/18  1650)  calcium gluconate 1 g/ 50 mL sodium chloride IVPB (0 g Intravenous Stopped 07/22/18 1755)     Initial Impression / Assessment and Plan / ED Course  I have reviewed the triage vital signs and the nursing notes.  Pertinent labs & imaging results that were available during my care of the patient were reviewed by me and considered in my medical decision making (see chart for details).     Patient presents for evaluation of dyspnea on exertion, worsening bilateral lower extremity edema, and fatigue.  History of iron deficiency anemia due to chronic blood loss.  She is afebrile, vital signs are stable in the ED.  She is nontoxic in appearance.  Chest x-ray shows no acute cardiopulmonary abnormalities.  BNP mildly elevated though appears to be at her baseline.  Lab work significant for hemoglobin of 5.8 and calcium of 4.8.  Hemoccult is positive though she does have some external hemorrhoids on examination that do not appear to be thrombosed or actively bleeding.  Chart review shows that on colonoscopy in 2016 the patient had multiple suspicious vascular lesions and arteriovenous malformations.  She was given 2 units of packed red blood cells in the ED and IV calcium. 5:26 PM Spoke with Dr. Havery Moros with gastroenterology who recommends hospitalist admission, clear liquid diet.  Gastroneurology will evaluate the patient in the morning.  Spoke with Dr. Wyline Copas with [facility] hospitalist service who agrees to assume care of patient and bring her into the hospital for further evaluation and management.  Final Clinical Impressions(s) / ED Diagnoses   Final diagnoses:  Symptomatic anemia  Hypocalcemia    ED Discharge Orders    None       Renita Papa, PA-C 07/22/18 1945  10:13 PM Addended note to document critical  care services provided.     Renita Papa, PA-C 07/22/18 2214    Varney Biles, MD 07/23/18 [phone]

## 2018-07-22 NOTE — ED Notes (Signed)
ED TO INPATIENT HANDOFF REPORT  Name/Age/Gender Connie [name] 84 y.o. female  Code Status    Code Status Orders  (From admission, onward)         Start     Ordered   07/22/18 1820  Full code  Continuous     07/22/18 1819        Code Status History    Date Active Date Inactive Code Status Order ID Comments User Context   10/20/2016 2304 10/21/2016 2126 Full Code [id number]  Rise Patience, MD Inpatient   12/16/2015 2207 12/21/2015 1510 Full Code [id number]  Samella Parr, NP Inpatient   11/21/2015 1551 11/22/2015 2201 Full Code [id number]  Lily Kocher, MD Inpatient   10/11/2015 0026 10/12/2015 1825 Full Code [id number]  Bonnielee Haff, MD Inpatient   02/19/2015 2001 02/21/2015 1815 Full Code [id number]  Jani Gravel, MD Inpatient    Advance Directive Documentation     Most Recent Value  Type of Advance Directive  Living will  Pre-existing out of facility DNR order (yellow form or pink MOST form)  -  "MOST" Form in Place?  -      Home/SNF/Other Home  Chief Complaint confused / fatigue   Level of Care/Admitting Diagnosis ED Disposition    ED Disposition Condition [place]: [place] [[id number]]  Level of Care: Med-Surg [16]  Diagnosis: Acute blood loss anemia [176160]  Admitting Physician: Donne Hazel [[id number]]  Attending Physician: CHIU, STEPHEN K [[id number]]  PT Class (Do Not Modify): Observation [104]  PT Acc Code (Do Not Modify): Observation [10022]       Medical History Past Medical History:  Diagnosis Date  . Allergy    RHINITIS  . Angiodysplasia of duodenum    hx/notes 11/21/2015  . Angiodysplasia of stomach    hx/notes 11/21/2015  . Arthritis    "joints ache"  . Bleeding gastric ulcer    hx/notes 11/21/2015  . Chronic blood loss anemia    secondary to angiodysplasia of the stomach and duodenum as well as history of bleeding gastric ulcer hx/notes 11/21/2015  . Chronic systolic CHF (congestive heart failure) ([place])  12/23/2015  . History of blood transfusion 01/2015; 06/2015; 11/21/2015  . Hypercholesteremia   . Hypertension   . Hypothyroidism   . Obesity   . Pneumonia "several times"  . Type II diabetes mellitus ([place])     Allergies No Known Allergies  IV Location/Drains/Wounds Patient Lines/Drains/Airways Status   Active Line/Drains/Airways    Name:   Placement date:   Placement time:   Site:   Days:   Peripheral IV 07/22/18 Right Antecubital   07/22/18    1355    Antecubital   less than 1   Airway   06/06/15    0815     1142          Labs/Imaging Results for orders placed or performed during the hospital encounter of 07/22/18 (from the past 48 hour(s))  Basic metabolic panel     Status: Abnormal   Collection Time: 07/22/18  1:59 PM  Result Value Ref Range   Sodium 145 135 - 145 mmol/L   Potassium 4.5 3.5 - 5.1 mmol/L   Chloride 113 (H) 98 - 111 mmol/L   CO2 24 22 - 32 mmol/L   Glucose, Bld 140 (H) 70 - 99 mg/dL   BUN 15 8 - 23 mg/dL   Creatinine, Ser 0.96 0.44 - 1.00 mg/dL   Calcium 4.8 (LL)  8.9 - 10.3 mg/dL    Comment: CRITICAL RESULT CALLED TO, READ BACK BY AND VERIFIED WITH: Dafne Nield,A RN [id number] COVINGTON,N    GFR calc non Af Amer 54 (L) >60 mL/min   GFR calc Af Amer >60 >60 mL/min    Comment: (NOTE) The eGFR has been calculated using the CKD EPI equation. This calculation has not been validated in all clinical situations. eGFR's persistently <60 mL/min signify possible Chronic Kidney Disease.    Anion gap 8 5 - 15    Comment: Performed at [facility], [place] [address] [place], [place] [address]  CBC     Status: Abnormal   Collection Time: 07/22/18  1:59 PM  Result Value Ref Range   WBC 11.6 (H) 4.0 - 10.5 K/uL   RBC 1.91 (L) 3.87 - 5.11 MIL/uL   Hemoglobin 5.8 (LL) 12.0 - 15.0 g/dL    Comment: This critical result has verified and been called to [facility] by Newman Nickels on 11 02 2019 at 1437, and has been read back. CRITICAL RESULT VERIFIED    HCT 20.7 (L) 36.0 - 46.0 %   MCV 108.4 (H) 80.0 - 100.0 fL   MCH 30.4 26.0 - 34.0 pg   MCHC 28.0 (L) 30.0 - 36.0 g/dL   RDW 18.8 (H) 11.5 - 15.5 %   Platelets 483 (H) 150 - 400 K/uL   nRBC 0.6 (H) 0.0 - 0.2 %    Comment: Performed at [facility], [place] [address], [place], [place] [address]  Hepatic function panel     Status: Abnormal   Collection Time: 07/22/18  1:59 PM  Result Value Ref Range   Total Protein 5.7 (L) 6.5 - 8.1 g/dL   Albumin 2.8 (L) 3.5 - 5.0 g/dL   AST 18 15 - 41 U/L   ALT 6 0 - 44 U/L   Alkaline Phosphatase 85 38 - 126 U/L   Total Bilirubin 0.6 0.3 - 1.2 mg/dL   Bilirubin, Direct <0.1 0.0 - 0.2 mg/dL   Indirect Bilirubin NOT CALCULATED 0.3 - 0.9 mg/dL    Comment: Performed at [facility], [place] [address], [place], [place] [address]  Brain natriuretic peptide     Status: Abnormal   Collection Time: 07/22/18  2:06 PM  Result Value Ref Range   B Natriuretic Peptide 189.4 (H) 0.0 - 100.0 pg/mL    Comment: Performed at [facility], [place] [address], [place], [place] [address]  I-stat troponin, ED     Status: None   Collection Time: 07/22/18  2:13 PM  Result Value Ref Range   Troponin i, poc 0.01 0.00 - 0.08 ng/mL   Comment 3            Comment: Due to the release kinetics of cTnI, a negative result within the first hours of the onset of symptoms does not rule out myocardial infarction with certainty. If myocardial infarction is still suspected, repeat the test at appropriate intervals.   POC occult blood, ED Provider will collect     Status: Abnormal   Collection Time: 07/22/18  3:07 PM  Result Value Ref Range   Fecal Occult Bld POSITIVE (A) NEGATIVE  Type and screen [place]     Status: None (Preliminary result)   Collection Time: 07/22/18  3:55 PM  Result Value Ref Range   ABO/RH(D) A POS    Antibody Screen NEG    Sample Expiration 07/25/2018    Unit  Number [id number]     Blood Component Type RED CELLS,LR    Unit division 00    Status of Unit ALLOCATED    Transfusion Status OK TO TRANSFUSE    Crossmatch Result Compatible    Unit Number [id number]    Blood Component Type RED CELLS,LR    Unit division 00    Status of Unit ISSUED    Transfusion Status OK TO TRANSFUSE    Crossmatch Result      Compatible Performed at [place] [address] [place], [place] [address]   Prepare RBC     Status: None   Collection Time: 07/22/18  3:55 PM  Result Value Ref Range   Order Confirmation      ORDER PROCESSED BY BLOOD BANK Performed at [facility] [place] [address], [place], [place] [address]   Vitamin B12     Status: None   Collection Time: 07/22/18  4:22 PM  Result Value Ref Range   Vitamin B-12 297 180 - 914 pg/mL    Comment: (NOTE) This assay is not validated for testing neonatal or myeloproliferative syndrome specimens for Vitamin B12 levels. Performed at [facility], [place] [address] [place], [place] [address]   Folate     Status: None   Collection Time: 07/22/18  4:22 PM  Result Value Ref Range   Folate 9.2 >5.9 ng/mL    Comment: Performed at [facility], [place] [address], [place], [place] [address]  Iron and TIBC     Status: Abnormal   Collection Time: 07/22/18  4:22 PM  Result Value Ref Range   Iron 38 28 - 170 ug/dL   TIBC 189 (L) 250 - 450 ug/dL   Saturation Ratios 20 10.4 - 31.8 %   UIBC 151 ug/dL    Comment: Performed at [facility], [place] [address], [place], [place] [address]  Ferritin     Status: Abnormal   Collection Time: 07/22/18  4:22 PM  Result Value Ref Range   Ferritin 681 (H) 11 - 307 ng/mL    Comment: Performed at [facility], [place] [address] [place], [place] [address]  Reticulocytes     Status: Abnormal   Collection Time: 07/22/18  4:50 PM  Result Value Ref Range   Retic Ct Pct 7.3 (H) 0.4 - 3.1 %    RBC. 1.71 (L) 3.87 - 5.11 MIL/uL   Retic Count, Absolute 124.3 19.0 - 186.0 K/uL   Immature Retic Fract 34.3 (H) 2.3 - 15.9 %    Comment: Performed at [facility], [place] [address], [place], [place] [address]   Dg Chest 2 View  Result Date: 07/22/2018 CLINICAL DATA:  Pt with bilateral lower extremity edema that has been worsening over the past week, pt with hx of CHF. Pt also c/o weakness and intermittent confusion, pt states she is receiving blood transfusions and iron for low hgb, last tran.*comment was truncated*hx chf, weakness EXAM: CHEST - 2 VIEW COMPARISON:  Radiograph 10/21/2016 FINDINGS: Normal cardiac silhouette. Levo kyphosis of the spine lungs are clear. No effusion, infiltrate pneumothorax. IMPRESSION: No acute cardiopulmonary process. Electronically Signed   By: Suzy Bouchard M.D.   On: 07/22/2018 14:54   None  Pending Labs Unresulted Labs (From admission, onward)    Start     Ordered   07/23/18 0500  Comprehensive metabolic panel  Tomorrow morning,   R     07/22/18 1819   07/23/18  0500  CBC  Tomorrow morning,   R     07/22/18 1819          Vitals/Pain Today's Vitals   07/22/18 1730 07/22/18 1735 07/22/18 1801 07/22/18 1814  BP: 122/73 122/73 136/65 (!) 142/43  Pulse: 64 67 69 70  Resp: _0 Temp:   98.4 F (36.9 C)   TempSrc:   Oral   SpO2: 99% 100% 98% 98%  Weight:      Height:      PainSc:        Isolation Precautions No active isolations  Medications Medications  acetaminophen (TYLENOL) tablet 650 mg (has no administration in time range)    Or  acetaminophen (TYLENOL) suppository 650 mg (has no administration in time range)  insulin aspart (novoLOG) injection 0-15 Units (has no administration in time range)  insulin aspart (novoLOG) injection 0-5 Units (has no administration in time range)  furosemide (LASIX) injection 40 mg (has no administration in time range)  amLODipine (NORVASC) tablet 10 mg (has no administration in  time range)  atorvastatin (LIPITOR) tablet 40 mg (has no administration in time range)  carvedilol (COREG) tablet 12.5 mg (has no administration in time range)  HYDROcodone-acetaminophen (NORCO/VICODIN) 5-325 MG per tablet 1 tablet (has no administration in time range)  levothyroxine (SYNTHROID, LEVOTHROID) tablet 125 mcg (has no administration in time range)  sacubitril-valsartan (ENTRESTO) 49-51 mg per tablet (has no administration in time range)  pantoprazole (PROTONIX) injection 40 mg (has no administration in time range)  0.9 %  sodium chloride infusion (10 mL/hr Intravenous New Bag/Given (Non-Interop) 07/22/18 1648)  pantoprazole (PROTONIX) injection 40 mg (40 mg Intravenous Given 07/22/18 1650)  calcium gluconate 1 g/ 50 mL sodium chloride IVPB (0 g Intravenous Stopped 07/22/18 1755)    Mobility walks with device

## 2018-07-23 DIAGNOSIS — D62 Acute posthemorrhagic anemia: Secondary | ICD-10-CM | POA: Diagnosis not present

## 2018-07-23 DIAGNOSIS — D649 Anemia, unspecified: Secondary | ICD-10-CM | POA: Diagnosis not present

## 2018-07-23 DIAGNOSIS — D500 Iron deficiency anemia secondary to blood loss (chronic): Secondary | ICD-10-CM | POA: Diagnosis not present

## 2018-07-23 DIAGNOSIS — E785 Hyperlipidemia, unspecified: Secondary | ICD-10-CM | POA: Diagnosis not present

## 2018-07-23 DIAGNOSIS — E1169 Type 2 diabetes mellitus with other specified complication: Secondary | ICD-10-CM | POA: Diagnosis not present

## 2018-07-23 DIAGNOSIS — I10 Essential (primary) hypertension: Secondary | ICD-10-CM | POA: Diagnosis not present

## 2018-07-23 DIAGNOSIS — E1159 Type 2 diabetes mellitus with other circulatory complications: Secondary | ICD-10-CM | POA: Diagnosis not present

## 2018-07-23 DIAGNOSIS — I428 Other cardiomyopathies: Secondary | ICD-10-CM | POA: Diagnosis not present

## 2018-07-23 LAB — COMPREHENSIVE METABOLIC PANEL
ALK PHOS: 82 U/L (ref 38–126)
ALT: 5 U/L (ref 0–44)
ANION GAP: 9 (ref 5–15)
AST: 14 U/L — ABNORMAL LOW (ref 15–41)
Albumin: 2.8 g/dL — ABNORMAL LOW (ref 3.5–5.0)
BUN: 12 mg/dL (ref 8–23)
CALCIUM: 4.9 mg/dL — AB (ref 8.9–10.3)
CO2: 24 mmol/L (ref 22–32)
Chloride: 111 mmol/L (ref 98–111)
Creatinine, Ser: 0.89 mg/dL (ref 0.44–1.00)
GFR calc non Af Amer: 59 mL/min — ABNORMAL LOW (ref >60)
Glucose, Bld: 97 mg/dL (ref 70–99)
POTASSIUM: 3.8 mmol/L (ref 3.5–5.1)
SODIUM: 144 mmol/L (ref 135–145)
Total Bilirubin: 1.1 mg/dL (ref 0.3–1.2)
Total Protein: 5.4 g/dL — ABNORMAL LOW (ref 6.5–8.1)

## 2018-07-23 LAB — CBC
HCT: 28.1 % — ABNORMAL LOW (ref 36.0–46.0)
Hemoglobin: 8.6 g/dL — ABNORMAL LOW (ref 12.0–15.0)
MCH: 30.3 pg (ref 26.0–34.0)
MCHC: 30.6 g/dL (ref 30.0–36.0)
MCV: 98.9 fL (ref 80.0–100.0)
NRBC: 0.3 % — AB (ref 0.0–0.2)
PLATELETS: 377 10*3/uL (ref 150–400)
RBC: 2.84 MIL/uL — AB (ref 3.87–5.11)
RDW: 19.9 % — ABNORMAL HIGH (ref 11.5–15.5)
WBC: 10.3 10*3/uL (ref 4.0–10.5)

## 2018-07-23 LAB — GLUCOSE, CAPILLARY
GLUCOSE-CAPILLARY: 130 mg/dL — AB (ref 70–99)
Glucose-Capillary: 93 mg/dL (ref 70–99)
Glucose-Capillary: 115 mg/dL — ABNORMAL HIGH (ref 70–99)
Glucose-Capillary: 129 mg/dL — ABNORMAL HIGH (ref 70–99)

## 2018-07-23 MED ORDER — PEG-KCL-NACL-NASULF-NA ASC-C 100 G PO SOLR
0.5000 | Freq: Once | ORAL | Status: AC
  Administered 2018-07-24: 100 g via ORAL

## 2018-07-23 MED ORDER — PEG-KCL-NACL-NASULF-NA ASC-C 100 G PO SOLR
0.5000 | Freq: Once | ORAL | Status: DC
  Filled 2018-07-23: qty 1

## 2018-07-23 MED ORDER — FUROSEMIDE 40 MG PO TABS
40.0000 mg | ORAL_TABLET | Freq: Every day | ORAL | Status: DC
  Administered 2018-07-24: 40 mg via ORAL
  Filled 2018-07-23: qty 1

## 2018-07-23 MED ORDER — PEG-KCL-NACL-NASULF-NA ASC-C 100 G PO SOLR
0.5000 | Freq: Once | ORAL | Status: AC
  Administered 2018-07-23: 100 g via ORAL

## 2018-07-23 MED ORDER — CALCIUM GLUCONATE-NACL 2-0.675 GM/100ML-% IV SOLN
2.0000 g | Freq: Once | INTRAVENOUS | Status: AC
  Administered 2018-07-23: 2000 mg via INTRAVENOUS
  Filled 2018-07-23: qty 100

## 2018-07-23 MED ORDER — PEG-KCL-NACL-NASULF-NA ASC-C 100 G PO SOLR: 1.0000 | Freq: Once | ORAL | Status: DC

## 2018-07-23 NOTE — Progress Notes (Signed)
CRITICAL VALUE ALERT  Critical Value:  Calcium  4.9  Date & Time Notied: 07/23/2018 at 0653  Provider Notified: [facility]  Orders Received/Actions taken: pending MD, day shift nurse aware

## 2018-07-23 NOTE — Consult Note (Addendum)
Referring Provider:  Dr. Wyline Copas, [facility] Primary Care Physician:  [name], MD Primary Gastroenterologist:  Dr. Benson Norway  Reason for Consultation:  Anemia and heme positive stools  HPI: Connie [name] is a 84 y.o. female with medical history significant of nonischemic cardiomyopathy, hx chronic iron deficiency anemia requiring blood transfusions, HTN, HLD, obesity, DM2 who presents to the ED with complaints of increased weakness and LE swelling over the past several days prior to ED visit.  This was also associated with subjective sob on minimal exertion as well as some lightheadedness.  Hgb found to be down at 5.8 grams so she received 2 units PRBC's with good response to 8.6 grams.  This was down from 8.1 grams just one month ago.  Cannot be sure of any dark or bloody stools at home.  Was heme positive here in the ED.   She has undergone multiple GI procedures since 2015 for evaluation of this ongoing IDA, but appears that the last procedures were actually late 2016.  She's had gastric, small bowel, and colonic AVM's.  They were ablated with APC.  A capsule endoscopy confirmed more AVMs in the proximal small bowel and she underwent a double balloon enteroscopy at [facility] with further ablation of small bowel AVMs in December 2016, which appears to have been her last procedure.  Has been supported with iron supplements and prn transfusions since then  Currently is very focused on having something to drink, asking multiple times for something as she is so dry that she cannot swallow.   Past Medical History:  Diagnosis Date  . Allergy    RHINITIS  . Angiodysplasia of duodenum    hx/notes 11/21/2015  . Angiodysplasia of stomach    hx/notes 11/21/2015  . Arthritis    "joints ache"  . Bleeding gastric ulcer    hx/notes 11/21/2015  . Chronic blood loss anemia    secondary to angiodysplasia of the stomach and duodenum as well as history of bleeding gastric ulcer hx/notes 11/21/2015  .  Chronic systolic CHF (congestive heart failure) ([place]) 12/23/2015  . History of blood transfusion 01/2015; 06/2015; 11/21/2015  . Hypercholesteremia   . Hypertension   . Hypothyroidism   . Obesity   . Pneumonia "several times"  . Type II diabetes mellitus ([place])     Past Surgical History:  Procedure Laterality Date  . CARDIAC CATHETERIZATION N/A 12/19/2015   Procedure: Left Heart Cath and Coronary Angiography;  Surgeon: Leonie Man, MD;  Location: [place] CV LAB;  Service: Cardiovascular;  Laterality: N/A;  . COLONOSCOPY N/A 08/30/2014   Procedure: COLONOSCOPY;  Surgeon: Beryle Beams, MD;  Location: [facility] ENDOSCOPY;  Service: Endoscopy;  Laterality: N/A;  . COLONOSCOPY N/A 02/21/2015   Procedure: COLONOSCOPY;  Surgeon: Carol Ada, MD;  Location: [facility] ENDOSCOPY;  Service: Endoscopy;  Laterality: N/A;  . ENTEROSCOPY N/A 06/06/2015   Procedure: ENTEROSCOPY;  Surgeon: Carol Ada, MD;  Location: [facility] ENDOSCOPY;  Service: Endoscopy;  Laterality: N/A;  . FRACTURE SURGERY    . GIVENS CAPSULE STUDY N/A 05/15/2015   Procedure: GIVENS CAPSULE STUDY;  Surgeon: Carol Ada, MD;  Location: [facility] ENDOSCOPY;  Service: Endoscopy;  Laterality: N/A;  . HOT HEMOSTASIS N/A 08/30/2014   Procedure: HOT HEMOSTASIS (ARGON PLASMA COAGULATION/BICAP);  Surgeon: Beryle Beams, MD;  Location: [facility] ENDOSCOPY;  Service: Endoscopy;  Laterality: N/A;  . HOT HEMOSTASIS N/A 06/06/2015   Procedure: HOT HEMOSTASIS (ARGON PLASMA COAGULATION/BICAP);  Surgeon: Carol Ada, MD;  Location: [facility] ENDOSCOPY;  Service:  Endoscopy;  Laterality: N/A;  . JOINT REPLACEMENT    . REVISION TOTAL KNEE ARTHROPLASTY Bilateral 2004-2015  . SHOULDER OPEN ROTATOR CUFF REPAIR Right 12/2004   open subacromial decompression, distal clavicle  resection, rotator cuff repair/notes 02/02/2011  . TONSILLECTOMY  1940s  . TOTAL KNEE ARTHROPLASTY Bilateral ~ 2002  . TOTAL THYROIDECTOMY  ~ 1966    Prior to Admission medications   Medication Sig Start Date End  Date Taking? Authorizing Provider  ACCU-CHEK FASTCLIX LANCETS MISC 1 each by Does not apply route 2 (two) times daily. DX E11.9 04/26/17  Yes [name], MD  acetaminophen (TYLENOL) 500 MG tablet Take 500 mg by mouth every 6 (six) hours as needed for mild pain or headache.    Yes [provider]  amLODipine (NORVASC) 10 MG tablet TAKE 1 TABLET (10 MG TOTAL) BY MOUTH DAILY. Patient taking differently: Take 10 mg by mouth daily.  03/14/18  Yes [name], MD  atorvastatin (LIPITOR) 40 MG tablet TAKE 1 TABLET EVERY DAY 10/31/17  Yes [name], MD  Blood Glucose Monitoring Suppl (ACCU-CHEK GUIDE) w/Device KIT 1 each by Does not apply route 2 (two) times daily. DX E11.9 04/26/17  Yes [name], MD  calcitonin, salmon, (MIACALCIN) 200 UNIT/ACT nasal spray Place 1 spray into alternate nostrils daily. 05/12/18  Yes [name], MD  Calcium Carb-Cholecalciferol (CALCIUM 1000 + D PO) Take 1 tablet by mouth.   Yes [provider]  carvedilol (COREG) 12.5 MG tablet TAKE 1 TABLET (12.5 MG TOTAL) BY MOUTH 2 (TWO) TIMES DAILY WITH A MEAL. 12/12/17  Yes Jerline Pain, MD  furosemide (LASIX) 40 MG tablet TAKE 1 TABLET (40 MG TOTAL) BY MOUTH DAILY. 03/20/18  Yes [name], MD  glucose blood (ACCU-CHEK GUIDE) test strip Patient to test 2 times daily DX E11.9 Use as instructed 04/26/17  Yes [name], MD  Lancets Misc. (ACCU-CHEK FASTCLIX LANCET) KIT 1 each by Does not apply route 2 (two) times daily. DX E11.9 04/26/17  Yes [name], MD  levothyroxine (SYNTHROID, LEVOTHROID) 125 MCG tablet TAKE 1 TABLET EVERY DAY 03/14/18  Yes [name], MD  Multiple Vitamins-Minerals (WOMENS MULTIVITAMIN PLUS PO) Take 1 tablet by mouth daily. 01/15/16  Yes [provider]  pantoprazole (PROTONIX) 20 MG tablet TAKE 1 TABLET (20 MG TOTAL) BY MOUTH DAILY. 03/14/18  Yes [name], MD  pioglitazone (ACTOS) 30 MG tablet TAKE 1 TABLET (30 MG TOTAL) BY MOUTH DAILY. Patient  taking differently: Take 30 mg by mouth daily.  03/14/18  Yes [name], MD  potassium chloride (K-DUR,KLOR-CON) 10 MEQ tablet TAKE 1 TABLET TWICE DAILY 07/13/18  Yes [name], MD  HYDROcodone-acetaminophen (NORCO) 5-325 MG tablet Take 1 tablet by mouth every 6 (six) hours as needed for moderate pain. Patient not taking: Reported on 07/22/2018 06/14/18   [name], MD  potassium chloride (K-DUR) 10 MEQ tablet Take 1 tablet (10 mEq total) by mouth as directed. Take 10 meq twice a day every Mon, Wed and Fri's Patient not taking: Reported on 07/22/2018 08/04/16 04/21/17  [name] T, PA-C  sacubitril-valsartan (ENTRESTO) 49-51 MG Take 1 tablet by mouth 2 (two) times daily. Patient not taking: Reported on 07/22/2018 02/27/18   Jerline Pain, MD    Current Facility-Administered Medications  Medication Dose Route Frequency Provider Last Rate Last Dose  . acetaminophen (TYLENOL) tablet 650 mg  650 mg Oral Q6H PRN Donne Hazel,  MD       Or  . acetaminophen (TYLENOL) suppository 650 mg  650 mg Rectal Q6H PRN Donne Hazel, MD      . amLODipine (NORVASC) tablet 10 mg  10 mg Oral Daily Donne Hazel, MD   10 mg at 07/22/18 2230  . atorvastatin (LIPITOR) tablet 40 mg  40 mg Oral q1800 Donne Hazel, MD   40 mg at 07/22/18 2230  . carvedilol (COREG) tablet 12.5 mg  12.5 mg Oral BID WC Donne Hazel, MD      . furosemide (LASIX) injection 40 mg  40 mg Intravenous Daily Donne Hazel, MD   40 mg at 07/22/18 2106  . HYDROcodone-acetaminophen (NORCO/VICODIN) 5-325 MG per tablet 1 tablet  1 tablet Oral Q6H PRN Donne Hazel, MD   1 tablet at 07/22/18 2231  . insulin aspart (novoLOG) injection 0-15 Units  0-15 Units Subcutaneous TID WC Donne Hazel, MD      . insulin aspart (novoLOG) injection 0-5 Units  0-5 Units Subcutaneous QHS Donne Hazel, MD      . levothyroxine (SYNTHROID, LEVOTHROID) tablet 125 mcg  125 mcg Oral QAC breakfast Donne Hazel, MD   125 mcg at 07/23/18  [phone]  . pantoprazole (PROTONIX) injection 40 mg  40 mg Intravenous Q24H Donne Hazel, MD      . sacubitril-valsartan [facility]) 49-51 mg per tablet  1 tablet Oral BID Donne Hazel, MD   1 tablet at 07/22/18 2231    Allergies as of 07/22/2018  . (No Known Allergies)    Family History  Problem Relation Age of Onset  . Asthma Mother   . Ulcers Father   . Anemia Sister     Social History   Socioeconomic History  . Marital status: Married    Spouse name: Not on file  . Number of children: Not on file  . Years of education: Not on file  . Highest education level: Not on file  Occupational History  . Not on file  Social Needs  . Financial resource strain: Not on file  . Food insecurity:    Worry: Not on file    Inability: Not on file  . Transportation needs:    Medical: Not on file    Non-medical: Not on file  Tobacco Use  . Smoking status: Never Smoker  . Smokeless tobacco: Never Used  Substance and Sexual Activity  . Alcohol use: No  . Drug use: No  . Sexual activity: Not Currently  Lifestyle  . Physical activity:    Days per week: Not on file    Minutes per session: Not on file  . Stress: Not on file  Relationships  . Social connections:    Talks on phone: Not on file    Gets together: Not on file    Attends religious service: Not on file    Active member of club or organization: Not on file    Attends meetings of clubs or organizations: Not on file    Relationship status: Not on file  . Intimate partner violence:    Fear of current or ex partner: Not on file    Emotionally abused: Not on file    Physically abused: Not on file    Forced sexual activity: Not on file  Other Topics Concern  . Not on file  Social History Narrative  . Not on file    Review of Systems: ROS is O/W negative except as  mentioned in HPI.  Physical Exam: Vital signs in last 24 hours: Temp:  [97.8 F (36.6 C)-98.7 F (37.1 C)] 98.2 F (36.8 C) (11/03 0455) Pulse Rate:   [62-76] 62 (11/03 0455) Resp:  [14-22] 14 (11/03 0455) BP: (116-144)/(41-83) 130/60 (11/03 0455) SpO2:  [97 %-100 %] 97 % (11/03 0455) Weight:  [52.2 kg-52.5 kg] 52.5 kg (11/02 2040) Last BM Date: 07/22/18 General:  Alert, Well-developed, well-nourished, pleasant and cooperative in NAD Head:  Normocephalic and atraumatic. Eyes:  Sclera clear, no icterus.   Conjunctiva pink. Ears:  Normal auditory acuity. Mouth:  No deformity or lesions. Lungs:  Clear throughout to auscultation.   No wheezes, crackles, or rhonchi.  Heart:  Regular rate and rhythm; no murmurs, clicks, rubs, or gallops. Abdomen:  Soft, non-distended.  BS present.  Non-tender. Rectal:  Deferred.  Was heme positive in the ED.  Msk:  Symmetrical without gross deformities. Pulses:  Normal pulses noted. Extremities:  B/L LE pitting edema with very thin skin and chronic skin color changes. Neurologic:  Alert and oriented x 4;  grossly normal neurologically. Skin:  Intact without significant lesions or rashes. Psych:  Alert and cooperative. Normal mood and affect.  Intake/Output from previous day: 11/02 0701 - 11/03 0700 In: 994 [Blood:944; IV Piggyback:50] Out: 3612 [AESLP:5300]  Lab Results: Recent Labs    07/22/18 1359  WBC 11.6*  HGB 5.8*  HCT 20.7*  PLT 483*   BMET Recent Labs    07/22/18 1359 07/23/18 0540  NA 145 144  K 4.5 3.8  CL 113* 111  CO2 24 24  GLUCOSE 140* 97  BUN 15 12  CREATININE 0.96 0.89  CALCIUM 4.8* 4.9*   LFT Recent Labs    07/22/18 1359 07/23/18 0540  PROT 5.7* 5.4*  ALBUMIN 2.8* 2.8*  AST 18 14*  ALT 6 5  ALKPHOS 85 82  BILITOT 0.6 1.1  BILIDIR <0.1  --   IBILI NOT CALCULATED  --    Studies/Results: Dg Chest 2 View  Result Date: 07/22/2018 CLINICAL DATA:  Pt with bilateral lower extremity edema that has been worsening over the past week, pt with hx of CHF. Pt also c/o weakness and intermittent confusion, pt states she is receiving blood transfusions and iron for low  hgb, last tran.*comment was truncated*hx chf, weakness EXAM: CHEST - 2 VIEW COMPARISON:  Radiograph 10/21/2016 FINDINGS: Normal cardiac silhouette. Levo kyphosis of the spine lungs are clear. No effusion, infiltrate pneumothorax. IMPRESSION: No acute cardiopulmonary process. Electronically Signed   By: Suzy Bouchard M.D.   On: 07/22/2018 14:54   IMPRESSION:  **84 year old female well known to Dr. Benson Norway for evaluation of IDA and heme positive stools in the past.  Has history of gastric, small bowel, and colon AVM's with last procedure being a double balloon enteroscopy at [facility] in 08/2015.  Has been supported with iron and blood transfusions prn since then.  Now here with Hgb of 5.8 grams, down from 8.1 grams just one month ago.  Responded well to 2 units of PRBC's to Hgb of 8.6 grams.  No known overt GI bleeding but was heme positive in the ED.  PLAN: *Monitor Hgb and transfuse further if needed. *Discussed with Dr. Havery Moros.  He is recommending EGD and colonoscopy.  Will plan for these tentatively for 11/4 with Dr. Benson Norway pending conversation with patient's family. *Will give clear liquid diet for now.   Laban Emperor. Gulianna Hornsby  07/23/2018, 9:14 AM

## 2018-07-23 NOTE — Progress Notes (Signed)
PROGRESS NOTE  Connie [name]:[id number] DOB: 1933-12-24 DOA: 07/22/2018 PCP: [name], MD  Brief History:  84 year old female with a history of systolic and diastolic CHF/nonischemic cardia myopathy, hypertension, hyperlipidemia, hypothyroidism, diabetes mellitus, iron deficiency anemia, duodenal and gastric AVMs presenting with one-week history of generalized weakness that have worsened since 07/19/2018.  The patient also complained of worsening lower extremity edema for the past 2 3 days prior to admission.  She denied any orthopnea, PND, or increasing abdominal girth but complained of some dyspnea on exertion.  Upon presentation, the patient was noted to have a hemoglobin of 5.8.  She was found to have heme positive stool in the emergency department.  She had hemoglobin of 8.1 on 06/23/2018.  The patient has visual impairment; therefore, she is unable to tell if she is having melena or hematochezia.  The patient has been receiving intermittent IV iron infusions through Dr. [name].  Since admission, the patient received 2 units PRBC.  GI was consulted to assist with management.  Assessment/Plan: Acute blood loss anemia/symptomatic anemia -Concerned that the patient may have blood loss secondary to gastric and duodenal AVMs -GI consult appreciated -Baseline hemoglobin 8-9 -Presenting hemoglobin 5.8 -Patient has a history of gastric, small bowel, and colonic AVMs.  Last procedure 08/2015 double balloon enteroscopy  -Has been supported with iron and blood transfusions prn since then -Planning for enteroscopy and colonoscopy on 11/4  Lower extremity edema -Likely secondary to hypoalbuminemia -Patient received 2 doses IV furosemide -deescalate back to po lasix -daily weights -06/23/18 weight 115.9 lbs -admission weight 115.7 lbs  Chronic systolic and diastolic CHF -01/06/3789 echo EF 55 to 60%, grade 1 DD, no WMA, mild AI/MR -Continue carvedilol -Transition back to  oral furosemide -Daily weights -Continue Entresto  Diabetes mellitus type 2, controlled -02/07/2018 hemoglobin A 1C--5.8 -d/c ISS  -Holding Actos  Essential hypertension -Continue carvedilol -discontinue amlodipine due to soft BP  Hypocalcemia -Intact PTH -Replete with IV calcium gluconate -Check 25 vitamin D -corrected calcium 5.4  Hyperlipidemia -Continue statin     Disposition Plan:   Home in 1-2 days  Family Communication:   Sister updated at bedside 11/3  Consultants:  [facility]  Code Status:  FULL   DVT Prophylaxis:  SCDs   Procedures: As Listed in Progress Note Above  Antibiotics: None    Subjective: Patient denies fevers, chills, headache, chest pain, dyspnea, nausea, vomiting, diarrhea, abdominal pain, dysuria, hematuria, hematochezia, and melena.   Objective: Vitals:   07/22/18 2342 07/23/18 0115 07/23/18 0455 07/23/18 1254  BP: (!) 128/46 (!) 132/44 130/60 (!) 101/42  Pulse: 64 66 62 60  Resp: _0 Temp: 98.1 F (36.7 C) 98.1 F (36.7 C) 98.2 F (36.8 C) 98.8 F (37.1 C)  TempSrc: Oral Oral Oral Oral  SpO2: 98% 98% 97% 97%  Weight:      Height:        Intake/Output Summary (Last 24 hours) at 07/23/2018 1603 Last data filed at 07/23/2018 0455 Gross per 24 hour  Intake 994 ml  Output 1420 ml  Net -426 ml   Weight change:  Exam:   General:  Pt is alert, follows commands appropriately, not in acute distress  HEENT: No icterus, No thrush, No neck mass, [place]/AT  Cardiovascular: RRR, S1/S2, no rubs, no gallops  Respiratory: CTA bilaterally, no wheezing, no crackles, no rhonchi   Abdomen: Soft/+BS, non tender, non distended, no guarding  Extremities: 1+LE  edema, No lymphangitis, No petechiae, No rashes, no synovitis   Data Reviewed: I have personally reviewed following labs and imaging studies Basic Metabolic Panel: Recent Labs  Lab 07/22/18 1359 07/23/18 0540  NA 145 144  K 4.5 3.8  CL 113* 111  CO2 24 24    GLUCOSE 140* 97  BUN 15 12  CREATININE 0.96 0.89  CALCIUM 4.8* 4.9*   Liver Function Tests: Recent Labs  Lab 07/22/18 1359 07/23/18 0540  AST 18 14*  ALT 6 5  ALKPHOS 85 82  BILITOT 0.6 1.1  PROT 5.7* 5.4*  ALBUMIN 2.8* 2.8*   No results for input(s): LIPASE, AMYLASE in the last 168 hours. No results for input(s): AMMONIA in the last 168 hours. Coagulation Profile: No results for input(s): INR, PROTIME in the last 168 hours. CBC: Recent Labs  Lab 07/22/18 1359 07/23/18 0540  WBC 11.6* 10.3  HGB 5.8* 8.6*  HCT 20.7* 28.1*  MCV 108.4* 98.9  PLT 483* 377   Cardiac Enzymes: No results for input(s): CKTOTAL, CKMB, CKMBINDEX, TROPONINI in the last 168 hours. BNP: Invalid input(s): POCBNP CBG: Recent Labs  Lab 07/22/18 2136 07/23/18 0753 07/23/18 1143  GLUCAP 86 93 115*   HbA1C: No results for input(s): HGBA1C in the last 72 hours. Urine analysis:    Component Value Date/Time   COLORURINE YELLOW 12/16/2015 2347   APPEARANCEUR CLEAR 12/16/2015 2347   LABSPEC 1.012 12/16/2015 2347   PHURINE 5.0 12/16/2015 2347   GLUCOSEU 100 (A) 12/16/2015 2347   HGBUR TRACE (A) 12/16/2015 2347   BILIRUBINUR NEGATIVE 12/16/2015 2347   BILIRUBINUR n 05/09/2015 1656   [place] 12/16/2015 2347   PROTEINUR 30 (A) 12/16/2015 2347   UROBILINOGEN negative 05/09/2015 1656   UROBILINOGEN 0.2 02/19/2015 1823   NITRITE NEGATIVE 12/16/2015 2347   LEUKOCYTESUR NEGATIVE 12/16/2015 2347   Sepsis Labs: _0 (procalcitonin:4,lacticidven:4) )No results found for this or any previous visit (from the past 240 hour(s)).   Scheduled Meds: . amLODipine  10 mg Oral Daily  . atorvastatin  40 mg Oral q1800  . carvedilol  12.5 mg Oral BID WC  . furosemide  40 mg Intravenous Daily  . insulin aspart  0-15 Units Subcutaneous TID WC  . insulin aspart  0-5 Units Subcutaneous QHS  . levothyroxine  125 mcg Oral QAC breakfast  . pantoprazole (PROTONIX) IV  40 mg Intravenous Q24H  .  [START ON 07/24/2018] peg 3350 powder  0.5 kit Oral Once   And  . peg 3350 powder  0.5 kit Oral Once  . sacubitril-valsartan  1 tablet Oral BID   Continuous Infusions:  Procedures/Studies: Dg Chest 2 View  Result Date: 07/22/2018 CLINICAL DATA:  Pt with bilateral lower extremity edema that has been worsening over the past week, pt with hx of CHF. Pt also c/o weakness and intermittent confusion, pt states she is receiving blood transfusions and iron for low hgb, last tran.*comment was truncated*hx chf, weakness EXAM: CHEST - 2 VIEW COMPARISON:  Radiograph 10/21/2016 FINDINGS: Normal cardiac silhouette. Levo kyphosis of the spine lungs are clear. No effusion, infiltrate pneumothorax. IMPRESSION: No acute cardiopulmonary process. Electronically Signed   By: Suzy Bouchard M.D.   On: 07/22/2018 14:54    Orson Eva, DO  [facility] Pager [phone]  If 7PM-7AM, please contact night-coverage [email or web address] Password [email or web address] 07/23/2018, 4:03 PM   LOS: 0 days

## 2018-07-23 NOTE — Evaluation (Signed)
Physical Therapy Evaluation Patient Details Name: Connie [name] MRN: [id number] DOB: Dec 08, 1933 Today's Date: 07/23/2018   History of Present Illness  Pt admitted 2* weakness and LE edema and found to have Hgb of 5.8.  Pt with hx of Bil TKR, anemia, CHF, DM, and cardiomyopathy  Clinical Impression  Pt admitted as above and presenting with functional mobility limitations 2* generalized weakness, ambulatory balance deficits and limited endurance.  Pt currently lives alone with very limited assist but for safety would benefit from 24/7 supervision/assistance at dc from hospital.  Per RN, SW will meet with family tomorrow to discuss options.  Pt would benefit from follow up PT at next venue to further address deficits.       Follow Up Recommendations Supervision/Assistance - 24 hour    Equipment Recommendations  None recommended by PT    Recommendations for Other Services       Precautions / Restrictions Precautions Precautions: Fall Restrictions Weight Bearing Restrictions: No      Mobility  Bed Mobility Overal bed mobility: Modified Independent             General bed mobility comments: Pt unassisted to sitting EOB  Transfers Overall transfer level: Needs assistance Equipment used: 4-wheeled walker Transfers: Sit to/from Stand Sit to Stand: Min guard         General transfer comment: steady assist only  Ambulation/Gait Ambulation/Gait assistance: Min guard Gait Distance (Feet): 150 Feet Assistive device: 4-wheeled walker Gait Pattern/deviations: Step-through pattern;Decreased step length - right;Decreased step length - left;Shuffle;Wide base of support Gait velocity: decr   General Gait Details: min cues for posture and position from RW; min guard assist for instability but no LOB noted   Stairs            Wheelchair Mobility    Modified Rankin (Stroke Patients Only)       Balance Overall balance assessment: Needs  assistance Sitting-balance support: No upper extremity supported;Feet supported Sitting balance-Leahy Scale: Good     Standing balance support: No upper extremity supported Standing balance-Leahy Scale: Fair                               Pertinent Vitals/Pain Pain Assessment: No/denies pain    Home Living Family/patient expects to be discharged to:: Unsure                 Additional Comments: Pt states she has been living alone but "they tell me that some changes need to be made"    Prior Function Level of Independence: Independent with assistive device(s)         Comments: Pt home alone and using Rollator for mobility     Hand Dominance        Extremity/Trunk Assessment   Upper Extremity Assessment Upper Extremity Assessment: Generalized weakness    Lower Extremity Assessment Lower Extremity Assessment: Generalized weakness       Communication   Communication: HOH  Cognition Arousal/Alertness: Awake/alert Behavior During Therapy: WFL for tasks assessed/performed Overall Cognitive Status: Within Functional Limits for tasks assessed                                        General Comments      Exercises     Assessment/Plan    PT Assessment Patient needs continued PT services  PT Problem List  Decreased strength;Decreased activity tolerance;Decreased balance;Decreased mobility;Decreased knowledge of use of DME       PT Treatment Interventions DME instruction;Gait training;Functional mobility training;Therapeutic activities;Balance training;Therapeutic exercise;Patient/family education    PT Goals (Current goals can be found in the Care Plan section)  Acute Rehab PT Goals Patient Stated Goal: Alternate living arrangements for pt safety PT Goal Formulation: With patient Time For Goal Achievement: 08/06/18 Potential to Achieve Goals: Fair    Frequency Min 3X/week   Barriers to discharge Decreased caregiver  support Pt home alone    Co-evaluation               AM-PAC PT "6 Clicks" Daily Activity  Outcome Measure Difficulty turning over in bed (including adjusting bedclothes, sheets and blankets)?: A Little Difficulty moving from lying on back to sitting on the side of the bed? : A Little Difficulty sitting down on and standing up from a chair with arms (e.g., wheelchair, bedside commode, etc,.)?: A Lot Help needed moving to and from a bed to chair (including a wheelchair)?: A Little Help needed walking in hospital room?: A Little Help needed climbing 3-5 steps with a railing? : A Little 6 Click Score: 17    End of Session Equipment Utilized During Treatment: Gait belt Activity Tolerance: Patient tolerated treatment well;Patient limited by fatigue Patient left: Other (comment)(bathroom with CNA) Nurse Communication: Mobility status PT Visit Diagnosis: Muscle weakness (generalized) (M62.81);Difficulty in walking, not elsewhere classified (R26.2);Unsteadiness on feet (R26.81)    Time: 0321-2248 PT Time Calculation (min) (ACUTE ONLY): 18 min   Charges:   PT Evaluation $PT Eval Low Complexity: 1 Low          [place] [facility] Pager [phone] Office [phone]   Avyanna Spada 07/23/2018, 4:54 PM

## 2018-07-24 ENCOUNTER — Inpatient Hospital Stay: Payer: Medicare HMO

## 2018-07-24 ENCOUNTER — Observation Stay (HOSPITAL_COMMUNITY): Payer: Medicare HMO | Admitting: Certified Registered Nurse Anesthetist

## 2018-07-24 ENCOUNTER — Encounter (HOSPITAL_COMMUNITY)
Admission: EM | Disposition: A | Discharge status: Home / Self Care | Payer: Self-pay | Source: Home / Self Care | Attending: Emergency Medicine

## 2018-07-24 ENCOUNTER — Other Ambulatory Visit: Payer: Medicare HMO

## 2018-07-24 ENCOUNTER — Encounter (HOSPITAL_COMMUNITY): Payer: Self-pay | Admitting: *Deleted

## 2018-07-24 DIAGNOSIS — I428 Other cardiomyopathies: Secondary | ICD-10-CM | POA: Diagnosis not present

## 2018-07-24 DIAGNOSIS — D500 Iron deficiency anemia secondary to blood loss (chronic): Secondary | ICD-10-CM

## 2018-07-24 DIAGNOSIS — E1159 Type 2 diabetes mellitus with other circulatory complications: Secondary | ICD-10-CM

## 2018-07-24 DIAGNOSIS — E119 Type 2 diabetes mellitus without complications: Secondary | ICD-10-CM | POA: Diagnosis not present

## 2018-07-24 DIAGNOSIS — D62 Acute posthemorrhagic anemia: Secondary | ICD-10-CM | POA: Diagnosis not present

## 2018-07-24 DIAGNOSIS — K921 Melena: Secondary | ICD-10-CM | POA: Diagnosis not present

## 2018-07-24 DIAGNOSIS — E669 Obesity, unspecified: Secondary | ICD-10-CM | POA: Diagnosis not present

## 2018-07-24 DIAGNOSIS — D509 Iron deficiency anemia, unspecified: Secondary | ICD-10-CM | POA: Diagnosis not present

## 2018-07-24 DIAGNOSIS — D649 Anemia, unspecified: Secondary | ICD-10-CM

## 2018-07-24 DIAGNOSIS — E118 Type 2 diabetes mellitus with unspecified complications: Secondary | ICD-10-CM

## 2018-07-24 DIAGNOSIS — K5730 Diverticulosis of large intestine without perforation or abscess without bleeding: Secondary | ICD-10-CM | POA: Diagnosis not present

## 2018-07-24 DIAGNOSIS — K2971 Gastritis, unspecified, with bleeding: Secondary | ICD-10-CM | POA: Diagnosis not present

## 2018-07-24 DIAGNOSIS — K2950 Unspecified chronic gastritis without bleeding: Secondary | ICD-10-CM | POA: Diagnosis not present

## 2018-07-24 DIAGNOSIS — E1169 Type 2 diabetes mellitus with other specified complication: Secondary | ICD-10-CM | POA: Diagnosis not present

## 2018-07-24 DIAGNOSIS — E785 Hyperlipidemia, unspecified: Secondary | ICD-10-CM | POA: Diagnosis not present

## 2018-07-24 DIAGNOSIS — R195 Other fecal abnormalities: Secondary | ICD-10-CM | POA: Diagnosis not present

## 2018-07-24 DIAGNOSIS — K5520 Angiodysplasia of colon without hemorrhage: Secondary | ICD-10-CM | POA: Diagnosis not present

## 2018-07-24 DIAGNOSIS — I5022 Chronic systolic (congestive) heart failure: Secondary | ICD-10-CM | POA: Diagnosis not present

## 2018-07-24 DIAGNOSIS — K2951 Unspecified chronic gastritis with bleeding: Secondary | ICD-10-CM | POA: Diagnosis not present

## 2018-07-24 DIAGNOSIS — I10 Essential (primary) hypertension: Secondary | ICD-10-CM

## 2018-07-24 DIAGNOSIS — I110 Hypertensive heart disease with heart failure: Secondary | ICD-10-CM | POA: Diagnosis not present

## 2018-07-24 HISTORY — PX: BIOPSY: SHX5522

## 2018-07-24 HISTORY — PX: COLONOSCOPY WITH PROPOFOL: SHX5780

## 2018-07-24 HISTORY — PX: ENTEROSCOPY: SHX5533

## 2018-07-24 HISTORY — PX: HOT HEMOSTASIS: SHX5433

## 2018-07-24 LAB — BPAM RBC
Blood Product Expiration Date: 201911272359
Blood Product Expiration Date: 201911272359
ISSUE DATE / TIME: 201911021759
ISSUE DATE / TIME: 201911022324
Unit Type and Rh: 6200
Unit Type and Rh: 6200

## 2018-07-24 LAB — GLUCOSE, CAPILLARY
GLUCOSE-CAPILLARY: 189 mg/dL — AB (ref 70–99)
Glucose-Capillary: 101 mg/dL — ABNORMAL HIGH (ref 70–99)
Glucose-Capillary: 155 mg/dL — ABNORMAL HIGH (ref 70–99)

## 2018-07-24 LAB — TYPE AND SCREEN
ABO/RH(D): A POS
Antibody Screen: NEGATIVE
Unit division: 0
Unit division: 0

## 2018-07-24 LAB — CBC
HCT: 29.1 % — ABNORMAL LOW (ref 36.0–46.0)
HEMOGLOBIN: 8.8 g/dL — AB (ref 12.0–15.0)
MCH: 29.9 pg (ref 26.0–34.0)
MCHC: 30.2 g/dL (ref 30.0–36.0)
MCV: 99 fL (ref 80.0–100.0)
PLATELETS: 366 10*3/uL (ref 150–400)
RBC: 2.94 MIL/uL — ABNORMAL LOW (ref 3.87–5.11)
RDW: 19 % — AB (ref 11.5–15.5)
WBC: 8.7 10*3/uL (ref 4.0–10.5)
nRBC: 0 % (ref 0.0–0.2)

## 2018-07-24 LAB — COMPREHENSIVE METABOLIC PANEL
ALBUMIN: 2.3 g/dL — AB (ref 3.5–5.0)
ALK PHOS: 76 U/L (ref 38–126)
ALT: 6 U/L (ref 0–44)
ANION GAP: 10 (ref 5–15)
AST: 13 U/L — AB (ref 15–41)
BUN: 12 mg/dL (ref 8–23)
CO2: 22 mmol/L (ref 22–32)
Calcium: 5.2 mg/dL — CL (ref 8.9–10.3)
Chloride: 113 mmol/L — ABNORMAL HIGH (ref 98–111)
Creatinine, Ser: 0.92 mg/dL (ref 0.44–1.00)
GFR calc Af Amer: >60 mL/min (ref >60)
GFR calc non Af Amer: 56 mL/min — ABNORMAL LOW (ref >60)
GLUCOSE: 103 mg/dL — AB (ref 70–99)
Potassium: 3.9 mmol/L (ref 3.5–5.1)
SODIUM: 145 mmol/L (ref 135–145)
Total Bilirubin: 0.8 mg/dL (ref 0.3–1.2)
Total Protein: 5 g/dL — ABNORMAL LOW (ref 6.5–8.1)

## 2018-07-24 LAB — HEMOGLOBIN A1C
Hgb A1c MFr Bld: 4.6 % — ABNORMAL LOW (ref 4.8–5.6)
Mean Plasma Glucose: 85.32 mg/dL

## 2018-07-24 SURGERY — COLONOSCOPY WITH PROPOFOL
Anesthesia: Monitor Anesthesia Care

## 2018-07-24 MED ORDER — PROPOFOL 500 MG/50ML IV EMUL
INTRAVENOUS | Status: DC | PRN
  Administered 2018-07-24: 100 ug/kg/min via INTRAVENOUS

## 2018-07-24 MED ORDER — CALCIUM GLUCONATE-NACL 2-0.675 GM/100ML-% IV SOLN
2.0000 g | Freq: Once | INTRAVENOUS | Status: AC
  Administered 2018-07-24: 2000 mg via INTRAVENOUS
  Filled 2018-07-24: qty 100

## 2018-07-24 MED ORDER — PROPOFOL 10 MG/ML IV BOLUS
INTRAVENOUS | Status: AC
  Filled 2018-07-24: qty 60

## 2018-07-24 MED ORDER — LIDOCAINE 2% (20 MG/ML) 5 ML SYRINGE
INTRAMUSCULAR | Status: DC | PRN
  Administered 2018-07-24: 60 mg via INTRAVENOUS

## 2018-07-24 MED ORDER — SODIUM CHLORIDE 0.9 % IV SOLN
INTRAVENOUS | Status: DC
  Administered 2018-07-24: 07:00:00 via INTRAVENOUS

## 2018-07-24 MED ORDER — EPHEDRINE SULFATE-NACL 50-0.9 MG/10ML-% IV SOSY
PREFILLED_SYRINGE | INTRAVENOUS | Status: DC | PRN
  Administered 2018-07-24 (×2): 5 mg via INTRAVENOUS

## 2018-07-24 MED ORDER — PIOGLITAZONE HCL 30 MG PO TABS
30.0000 mg | ORAL_TABLET | Freq: Every day | ORAL | Status: DC
  Administered 2018-07-24 – 2018-07-25 (×2): 30 mg via ORAL
  Filled 2018-07-24 (×2): qty 1

## 2018-07-24 MED ORDER — PROPOFOL 10 MG/ML IV BOLUS
INTRAVENOUS | Status: DC | PRN
  Administered 2018-07-24 (×2): 20 mg via INTRAVENOUS
  Administered 2018-07-24: 10 mg via INTRAVENOUS
  Administered 2018-07-24 (×2): 20 mg via INTRAVENOUS

## 2018-07-24 MED ORDER — SODIUM CHLORIDE 0.9 % IV SOLN: INTRAVENOUS | Status: DC | PRN

## 2018-07-24 MED ORDER — CALCIUM GLUCONATE-NACL 2-0.675 GM/100ML-% IV SOLN
2.0000 g | Freq: Two times a day (BID) | INTRAVENOUS | Status: AC
  Administered 2018-07-24 (×2): 2000 mg via INTRAVENOUS
  Filled 2018-07-24 (×2): qty 100

## 2018-07-24 SURGICAL SUPPLY — 25 items

## 2018-07-24 NOTE — Anesthesia Postprocedure Evaluation (Signed)
Anesthesia Post Note  Patient: Connie [name]  Procedure(s) Performed: ESOPHAGOGASTRODUODENOSCOPY (EGD) WITH PROPOFOL (N/A ) COLONOSCOPY WITH PROPOFOL (N/A ) HOT HEMOSTASIS (ARGON PLASMA COAGULATION/BICAP) (N/A )     Patient location during evaluation: PACU Anesthesia Type: MAC Level of consciousness: awake and alert Pain management: pain level controlled Vital Signs Assessment: post-procedure vital signs reviewed and stable Respiratory status: spontaneous breathing, nonlabored ventilation and respiratory function stable Cardiovascular status: stable and blood pressure returned to baseline Anesthetic complications: no    Last Vitals:  Vitals:   07/24/18 0840 07/24/18 0845  BP: (!) 128/27   Pulse: 62 (!) 51  Resp: 15 15  Temp:    SpO2: 95% 96%    Last Pain:  Vitals:   07/24/18 0840  TempSrc:   PainSc: 0-No pain                 Audry Pili

## 2018-07-24 NOTE — Interval H&P Note (Signed)
History and Physical Interval Note:  07/24/2018 7:32 AM  Connie [name]  has presented today for surgery, with the diagnosis of Anemia, heme positive stool, history of AVM's  The various methods of treatment have been discussed with the patient and family. After consideration of risks, benefits and other options for treatment, the patient has consented to  Procedure(s): ESOPHAGOGASTRODUODENOSCOPY (EGD) WITH PROPOFOL (N/A) COLONOSCOPY WITH PROPOFOL (N/A) as a surgical intervention .  The patient's history has been reviewed, patient examined, no change in status, stable for surgery.  I have reviewed the patient's chart and labs.  Questions were answered to the patient's satisfaction.     Leovardo Thoman D

## 2018-07-24 NOTE — Progress Notes (Signed)
PROGRESS NOTE  Connie [name] BBC:[id number] DOB: 15-Aug-1934 DOA: 07/22/2018 PCP: [name], MD  Brief History:  84 year old female with a history of systolic and diastolic CHF/nonischemic cardia myopathy, hypertension, hyperlipidemia, hypothyroidism, diabetes mellitus, iron deficiency anemia, duodenal and gastric AVMs presenting with one-week history of generalized weakness that have worsened since 07/19/2018.  The patient also complained of worsening lower extremity edema for the past 2 3 days prior to admission.  She denied any orthopnea, PND, or increasing abdominal girth but complained of some dyspnea on exertion.  Upon presentation, the patient was noted to have a hemoglobin of 5.8.  She was found to have heme positive stool in the emergency department.  She had hemoglobin of 8.1 on 06/23/2018.  The patient has visual impairment; therefore, she is unable to tell if she is having melena or hematochezia.  The patient has been receiving intermittent IV iron infusions through Dr. [name].  Since admission, the patient received 2 units PRBC.  GI was consulted to assist with management.  Assessment/Plan: Acute on chronic blood loss anemia/symptomatic anemia likely due to gastric, SB and colonic AVMs -Hgb currently stable s/p 2U of PRBC (baseline hemoglobin 8-9), 5.8 on admission  -Patient has a history of gastric, small bowel, and colonic AVMs.  Last procedure 08/2015 double balloon enteroscopy  -GI on board: Had enteroscopy and colonoscopy on 11/4 which showed: Gastritis with hemorrhage, two non-bleeding colonic angiodysplastic lesions. Advised PPI, follow up in 2 weeks  Lower extremity edema -Likely secondary to hypoalbuminemia Vs CHF -Patient received 2 doses IV furosemide, continue with po lasix  Chronic systolic and diastolic CHF -01/20/8881 echo EF 55 to 60%, grade 1 DD, no WMA, mild AI/MR -Continue carvedilol, lasix, entresto -Daily weights, strict I&O -Continue  Entresto  Diabetes mellitus type 2, controlled -02/07/2018 hemoglobin A 1C--5.8 -Continue Actos  Essential hypertension -Continue carvedilol -Held amlodipine due to soft BP  Hypocalcemia -Intact PTH -Replete with IV calcium gluconate -Check 25 vitamin D, PTH pending, ionized Ca -Held home calcitonin   Hyperlipidemia -Continue statin     Disposition Plan: Home likely 07/25/18  Family Communication: Son updated at bedside 11/4  Consultants: GI  Code Status:  FULL   DVT Prophylaxis:  SCDs   Procedures: As Listed in Progress Note Above  Antibiotics: None    Subjective: Patient denies any complaints, very eager to go home. Denies any chest pain, SOB, melena/hematochezia, fever/chills   Objective: Vitals:   07/24/18 0840 07/24/18 0845 07/24/18 1023 07/24/18 1220  BP: (!) 128/27  (!) 120/51 (!) 133/43  Pulse: 62 (!) 51 66 65  Resp: 15 15 16 16   Temp:   98 F (36.7 C) 98 F (36.7 C)  TempSrc:   Oral Oral  SpO2: 95% 96% 97% 98%  Weight:      Height:        Intake/Output Summary (Last 24 hours) at 07/24/2018 1452 Last data filed at 07/24/2018 1100 Gross per 24 hour  Intake 740 ml  Output -  Net 740 ml   Weight change: 2.436 kg Exam:   General: NAD   Cardiovascular: S1, S2 present  Respiratory: CTAB  Abdomen: Soft, nontender, nondistended, bowel sounds present  Musculoskeletal: Trace bilateral pedal edema noted  Skin: Normal  Psychiatry: No new focal neurologic deficits noted    Data Reviewed: I have personally reviewed following labs and imaging studies Basic Metabolic Panel: Recent Labs  Lab 07/22/18 1359 07/23/18 0540 07/24/18 0513  NA  145 144 145  K 4.5 3.8 3.9  CL 113* 111 113*  CO2 24 24 22   GLUCOSE 140* 97 103*  BUN 15 12 12   CREATININE 0.96 0.89 0.92  CALCIUM 4.8* 4.9* 5.2*   Liver Function Tests: Recent Labs  Lab 07/22/18 1359 07/23/18 0540 07/24/18 0513  AST 18 14* 13*  ALT 6 5 6   ALKPHOS 85 82 76  BILITOT 0.6  1.1 0.8  PROT 5.7* 5.4* 5.0*  ALBUMIN 2.8* 2.8* 2.3*   No results for input(s): LIPASE, AMYLASE in the last 168 hours. No results for input(s): AMMONIA in the last 168 hours. Coagulation Profile: No results for input(s): INR, PROTIME in the last 168 hours. CBC: Recent Labs  Lab 07/22/18 1359 07/23/18 0540 07/24/18 0513  WBC 11.6* 10.3 8.7  HGB 5.8* 8.6* 8.8*  HCT 20.7* 28.1* 29.1*  MCV 108.4* 98.9 99.0  PLT 483* 377 366   Cardiac Enzymes: No results for input(s): CKTOTAL, CKMB, CKMBINDEX, TROPONINI in the last 168 hours. BNP: Invalid input(s): POCBNP CBG: Recent Labs  Lab 07/23/18 1143 07/23/18 1719 07/23/18 2123 07/24/18 1025 07/24/18 1218  GLUCAP 115* 129* 130* 101* 189*   HbA1C: Recent Labs    07/24/18 0513  HGBA1C 4.6*   Urine analysis:    Component Value Date/Time   COLORURINE YELLOW 12/16/2015 2347   [place] 12/16/2015 2347   LABSPEC 1.012 12/16/2015 2347   PHURINE 5.0 12/16/2015 2347   GLUCOSEU 100 (A) 12/16/2015 2347   HGBUR TRACE (A) 12/16/2015 2347   BILIRUBINUR NEGATIVE 12/16/2015 2347   BILIRUBINUR n 05/09/2015 1656   [place] NEGATIVE 12/16/2015 2347   PROTEINUR 30 (A) 12/16/2015 2347   UROBILINOGEN negative 05/09/2015 1656   UROBILINOGEN 0.2 02/19/2015 1823   NITRITE NEGATIVE 12/16/2015 2347   LEUKOCYTESUR NEGATIVE 12/16/2015 2347   Sepsis Labs: @LABRCNTIP (procalcitonin:4,lacticidven:4) )No results found for this or any previous visit (from the past 240 hour(s)).   Scheduled Meds: . atorvastatin  40 mg Oral q1800  . carvedilol  12.5 mg Oral BID WC  . furosemide  40 mg Oral Daily  . levothyroxine  125 mcg Oral QAC breakfast  . pantoprazole (PROTONIX) IV  40 mg Intravenous Q24H  . sacubitril-valsartan  1 tablet Oral BID   Continuous Infusions: . calcium gluconate 2,000 mg (07/24/18 1024)    Procedures/Studies: Dg Chest 2 View  Result Date: 07/22/2018 CLINICAL DATA:  Pt with bilateral lower extremity edema that has  been worsening over the past week, pt with hx of CHF. Pt also c/o weakness and intermittent confusion, pt states she is receiving blood transfusions and iron for low hgb, last tran.*comment was truncated*hx chf, weakness EXAM: CHEST - 2 VIEW COMPARISON:  Radiograph 10/21/2016 FINDINGS: Normal cardiac silhouette. Levo kyphosis of the spine lungs are clear. No effusion, infiltrate pneumothorax. IMPRESSION: No acute cardiopulmonary process. Electronically Signed   By: Suzy Bouchard M.D.   On: 07/22/2018 14:54    Alma Friendly, MD  [facility]   If 7PM-7AM, please contact night-coverage [email or web address] 07/24/2018, 2:52 PM   LOS: 0 days

## 2018-07-24 NOTE — Anesthesia Preprocedure Evaluation (Addendum)
Anesthesia Evaluation  Patient identified by MRN, date of birth, ID band Patient awake    Reviewed: Allergy & Precautions, NPO status , Patient's Chart, lab work & pertinent test results, reviewed documented beta blocker date and time   History of Anesthesia Complications Negative for: history of anesthetic complications  Airway Mallampati: II  TM Distance: >3 FB Neck ROM: Full    Dental  (+) Dental Advisory Given, Teeth Intact   Pulmonary neg pulmonary ROS,    breath sounds clear to auscultation       Cardiovascular hypertension, Pt. on medications and Pt. on home beta blockers + CAD and + Peripheral Vascular Disease   Rhythm:Regular Rate:Normal   '18 TTE - EF 55% to 60%. Grade 1 diastolic dysfunction. Mild AI. Mild MR. The left atrium was moderately to severely dilated  '17 Cath - 1.Ost LM to LM lesion, 40% stenosed. 2.Ost RCA lesion, 20% stenosed. 3.There is moderate left ventricular systolic dysfunction.   Neuro/Psych negative neurological ROS  negative psych ROS   GI/Hepatic Neg liver ROS, PUD, GERD  Medicated and Controlled,  Endo/Other  diabetes, Type 2, Oral Hypoglycemic AgentsHypothyroidism  Hypocalcemia   Renal/GU negative Renal ROS     Musculoskeletal  (+) Arthritis ,   Abdominal   Peds  Hematology  (+) anemia ,   Anesthesia Other Findings   Reproductive/Obstetrics                            Anesthesia Physical Anesthesia Plan  ASA: III  Anesthesia Plan: MAC   Post-op Pain Management:    Induction: Intravenous  PONV Risk Score and Plan: 2 and Propofol infusion and Treatment may vary due to age or medical condition  Airway Management Planned: Nasal Cannula and Natural Airway  Additional Equipment: None  Intra-op Plan:   Post-operative Plan:   Informed Consent: I have reviewed the patients History and Physical, chart, labs and discussed the procedure including  the risks, benefits and alternatives for the proposed anesthesia with the patient or authorized representative who has indicated his/her understanding and acceptance.     Plan Discussed with: CRNA and Anesthesiologist  Anesthesia Plan Comments:        Anesthesia Quick Evaluation

## 2018-07-24 NOTE — Progress Notes (Addendum)
CRITICAL VALUE ALERT  Critical Value:  Calcium 5.2  Date & Time Notied: 11/045/2019  At 0600  Provider Notified: Hospitalist  Dr Silas [place]  Orders Received/Actions taken: Order for calcium gluconate 2g IV obtained and administered at 0620

## 2018-07-24 NOTE — Evaluation (Signed)
Occupational Therapy Evaluation Patient Details Name: Connie [name] MRN: [id number] DOB: 23-Aug-1934 Today's Date: 07/24/2018    History of Present Illness Pt admitted 2* weakness and LE edema and found to have Hgb of 5.8.  Pt with hx of Bil TKR, anemia, CHF, DM, and cardiomyopathy   Clinical Impression  Pt admitted with the above Pt currently with functional limitations due to the deficits listed below (see OT Problem List).  Pt will benefit from skilled OT to increase their safety and independence with ADL and functional mobility for ADL to facilitate discharge to venue listed below.      Follow Up Recommendations  Home health OT;Supervision/Assistance - 24 hour    Equipment Recommendations  3 in 1 bedside commode       Precautions / Restrictions Precautions Precautions: Fall Restrictions Weight Bearing Restrictions: No      Mobility Bed Mobility Overal bed mobility: Modified Independent             General bed mobility comments: Pt unassisted to sitting EOB  Transfers Overall transfer level: Needs assistance Equipment used: Rolling walker (2 wheeled) Transfers: Sit to/from [facility] Sit to Stand: Min assist Stand pivot transfers: Mod assist       General transfer comment: needed increased A this day    Balance Overall balance assessment: Needs assistance Sitting-balance support: No upper extremity supported;Feet supported Sitting balance-Leahy Scale: Good     Standing balance support: No upper extremity supported Standing balance-Leahy Scale: Fair                             ADL either performed or assessed with clinical judgement   ADL Overall ADL's : Needs assistance/impaired Eating/Feeding: Set up;Sitting   Grooming: Set up;Sitting   Upper Body Bathing: Set up;Sitting   Lower Body Bathing: Moderate assistance;Sit to/from stand;Cueing for sequencing;Cueing for safety   Upper Body Dressing : Set up;Sitting    Lower Body Dressing: Moderate assistance;Sit to/from stand;Cueing for sequencing;Cueing for safety   Toilet Transfer: Moderate assistance;Stand-pivot;BSC   Toileting- Clothing Manipulation and Hygiene: Moderate assistance;Cueing for sequencing;Cueing for safety         General ADL Comments: Pt needed significant A getting to chair.  Pt assures OT she will have A at home as family was making some changes     Vision Patient Visual Report: No change from baseline              Pertinent Vitals/Pain Pain Assessment: No/denies pain     Hand Dominance     Extremity/Trunk Assessment Upper Extremity Assessment Upper Extremity Assessment: Generalized weakness           Communication Communication Communication: No difficulties   Cognition Arousal/Alertness: Awake/alert Behavior During Therapy: WFL for tasks assessed/performed Overall Cognitive Status: Within Functional Limits for tasks assessed                                                Home Living Family/patient expects to be discharged to:: Unsure Living Arrangements: Alone Available Help at Discharge: Family Type of Home: House Home Access: Stairs to enter     Home Layout: One level     Bathroom Shower/Tub: Tub/shower unit;Walk-in shower   Bathroom Toilet: Standard     Home Equipment: Environmental consultant - 2 wheels;Cane - single point  Additional Comments: Pt states she has been living alone but "they tell me that some changes need to be made"      Prior Functioning/Environment          Comments: Pt home alone and using Rollator for mobility        OT Problem List: Decreased strength;Decreased activity tolerance;Impaired balance (sitting and/or standing);Decreased knowledge of use of DME or AE      OT Treatment/Interventions: Self-care/ADL training;Patient/family education;Therapeutic activities    OT Goals(Current goals can be found in the care plan section) Acute Rehab OT  Goals Patient Stated Goal: Alternate living arrangements for pt safety OT Goal Formulation: With patient Time For Goal Achievement: 07/31/18 Potential to Achieve Goals: Good  OT Frequency: Min 2X/week              AM-PAC PT "6 Clicks" Daily Activity     Outcome Measure Help from another person eating meals?: None Help from another person taking care of personal grooming?: A Little Help from another person toileting, which includes using toliet, bedpan, or urinal?: A Lot Help from another person bathing (including washing, rinsing, drying)?: A Little Help from another person to put on and taking off regular upper body clothing?: A Little Help from another person to put on and taking off regular lower body clothing?: A Lot 6 Click Score: 17   End of Session Equipment Utilized During Treatment: Rolling walker Nurse Communication: Mobility status  Activity Tolerance: Patient tolerated treatment well Patient left: in chair  OT Visit Diagnosis: Unsteadiness on feet (R26.81);History of falling (Z91.81);Muscle weakness (generalized) (M62.81)                Time: 1040-1100 OT Time Calculation (min): 20 min Charges:  OT General Charges $OT Visit: 1 Visit OT Evaluation $OT Eval Moderate Complexity: 1 Mod  Kari Baars, [place] [phone] Office- [phone]     Khalon Cansler, Edwena Felty D 07/24/2018, 1:48 PM

## 2018-07-24 NOTE — Transfer of Care (Signed)
Immediate Anesthesia Transfer of Care Note  Patient: Connie [name]  Procedure(s) Performed: ESOPHAGOGASTRODUODENOSCOPY (EGD) WITH PROPOFOL (N/A ) COLONOSCOPY WITH PROPOFOL (N/A ) HOT HEMOSTASIS (ARGON PLASMA COAGULATION/BICAP) (N/A )  Patient Location: Endoscopy Unit  Anesthesia Type:MAC  Level of Consciousness: drowsy  Airway & Oxygen Therapy: Patient Spontanous Breathing and Patient connected to nasal cannula oxygen  Post-op Assessment: Report given to RN and Post -op Vital signs reviewed and stable  Post vital signs: Reviewed and stable  Last Vitals:  Vitals Value Taken Time  BP 106/29 07/24/2018  8:17 AM  Temp    Pulse 57 07/24/2018  8:18 AM  Resp 20 07/24/2018  8:18 AM  SpO2 100 % 07/24/2018  8:18 AM  Vitals shown include unvalidated device data.  Last Pain:  Vitals:   07/24/18 0719  TempSrc: Oral  PainSc:          Complications: No apparent anesthesia complications

## 2018-07-24 NOTE — Op Note (Signed)
[facility] Patient Name: Connie [name] Procedure Date: 07/24/2018 MRN: [id number] Attending MD: Carol Ada , MD Date of Birth: [birth year] CSN: [id number] Age: 84 Admit Type: Inpatient Procedure:                Colonoscopy Indications:              Heme positive stool, Iron deficiency anemia Providers:                Carol Ada, MD, Elmer Ramp. Tilden Dome, RN, William Dalton, Technician Referring MD:              Medicines:                Propofol per Anesthesia Complications:            No immediate complications. Estimated Blood Loss:     Estimated blood loss: none. Procedure:                Pre-Anesthesia Assessment:                           - Prior to the procedure, a History and Physical                            was performed, and patient medications and                            allergies were reviewed. The patient's tolerance of                            previous anesthesia was also reviewed. The risks                            and benefits of the procedure and the sedation                            options and risks were discussed with the patient.                            All questions were answered, and informed consent                            was obtained. Prior Anticoagulants: The patient has                            taken no previous anticoagulant or antiplatelet                            agents. ASA Grade Assessment: III - A patient with                            severe systemic disease. After reviewing the risks  and benefits, the patient was deemed in                            satisfactory condition to undergo the procedure.                           - Sedation was administered by an anesthesia                            professional. Deep sedation was attained.                           After obtaining informed consent, the colonoscope                            was passed under  direct vision. Throughout the                            procedure, the patient's blood pressure, pulse, and                            oxygen saturations were monitored continuously. The                            CF-HQ190L ([id number] Olympus adult colonoscope was                            introduced through the anus and advanced to the the                            cecum, identified by appendiceal orifice and                            ileocecal valve. The PCF-H190DL ([id number]) Olympus                            peds colonscope was introduced through the and                            advanced to the. The colonoscopy was technically                            difficult and complex due to significant looping.                            Successful completion of the procedure was aided by                            applying abdominal pressure. The patient tolerated                            the procedure well. The quality of the bowel  preparation was excellent. The ileocecal valve,                            appendiceal orifice, and rectum were photographed. Scope In: 7:52:00 AM Scope Out: 8:08:46 AM Scope Withdrawal Time: 0 hours 6 minutes 54 seconds  Total Procedure Duration: 0 hours 16 minutes 46 seconds  Findings:      Two small localized angiodysplastic lesions without bleeding were found       at the hepatic flexure and in the cecum. Coagulation for tissue       destruction using monopolar probe was successful. Estimated blood loss:       none.      A few small-mouthed diverticula were found in the sigmoid colon. Impression:               - Two non-bleeding colonic angiodysplastic lesions.                            Treated with a monopolar probe.                           - Diverticulosis in the sigmoid colon.                           - No specimens collected. Moderate Sedation:      Not Applicable - Patient had care per Anesthesia. Recommendation:            - Return patient to hospital ward for ongoing care.                           - Resume regular diet.                           - Continue present medications.                           - Return to GI office in 2 weeks. Procedure Code(s):        --- Professional ---                           [phone], Colonoscopy, flexible; with ablation of                            tumor(s), polyp(s), or other lesion(s) (includes                            pre- and post-dilation and guide wire passage, when                            performed) Diagnosis Code(s):        --- Professional ---                           K55.20, Angiodysplasia of colon without hemorrhage                           R19.5, Other fecal abnormalities  D50.9, Iron deficiency anemia, unspecified                           K57.30, Diverticulosis of large intestine without                            perforation or abscess without bleeding CPT copyright 2018 [employer]. All rights reserved. The codes documented in this report are preliminary and upon coder review may  be revised to meet current compliance requirements. Carol Ada, MD Carol Ada, MD 07/24/2018 8:17:29 AM This report has been signed electronically. Number of Addenda: 0

## 2018-07-24 NOTE — Op Note (Signed)
[facility] Patient Name: Connie [name] Procedure Date: 07/24/2018 MRN: [id number] Attending MD: Carol Ada , MD Date of Birth: Dec 30, 1933 CSN: [id number] Age: 84 Admit Type: Inpatient Procedure:                Small bowel enteroscopy Indications:              Iron deficiency anemia, Melena Providers:                Carol Ada, MD, Tory Emerald, RN, William Dalton, Technician Referring MD:              Medicines:                Propofol per Anesthesia Complications:            No immediate complications. Estimated Blood Loss:     Estimated blood loss was minimal. Procedure:                Pre-Anesthesia Assessment:                           - Prior to the procedure, a History and Physical                            was performed, and patient medications and                            allergies were reviewed. The patient's tolerance of                            previous anesthesia was also reviewed. The risks                            and benefits of the procedure and the sedation                            options and risks were discussed with the patient.                            All questions were answered, and informed consent                            was obtained. Prior Anticoagulants: The patient has                            taken no previous anticoagulant or antiplatelet                            agents. ASA Grade Assessment: III - A patient with                            severe systemic disease. After reviewing the risks  and benefits, the patient was deemed in                            satisfactory condition to undergo the procedure.                           - Sedation was administered by an anesthesia                            professional. Deep sedation was attained.                           After obtaining informed consent, the endoscope was                            passed under  direct vision. Throughout the                            procedure, the patient's blood pressure, pulse, and                            oxygen saturations were monitored continuously. The                            PCF-H190DL ([id number]) Olympus peds colonscope was                            introduced through the mouth, and advanced to the                            proximal jejunum. After obtaining informed consent,                            the endoscope was passed under direct vision.                            Throughout the procedure, the patient's blood                            pressure, pulse, and oxygen saturations were                            monitored continuously.The small bowel enteroscopy                            was accomplished without difficulty. The patient                            tolerated the procedure well. Scope In: Scope Out: Findings:      The examined esophagus was normal.      Diffuse moderate inflammation with hemorrhage characterized by erythema,       granularity, and hematin was found in the gastric body. Biopsies were       taken with a cold forceps for histology.      The  examined duodenum was normal.      There was no evidence of significant pathology in the entire examined       portion of jejunum.      The source of her anemia and melena appears to be from the gastric       lumen. There was evidence of healed small ulcerations, hematin, and the       gastritis. Impression:               - Normal esophagus.                           - Gastritis with hemorrhage. Biopsied.                           - Normal examined duodenum.                           - The examined portion of the jejunum was normal. Recommendation:           - Return patient to hospital ward for ongoing care.                           - Resume regular diet.                           - Use a proton pump inhibitor PO daily.                           - Return to GI office in 2  weeks.                           - Await biopsy results. Procedure Code(s):        --- Professional ---                           [phone], Small intestinal endoscopy, enteroscopy                            beyond second portion of duodenum, not including                            ileum; with biopsy, single or multiple Diagnosis Code(s):        --- Professional ---                           K29.71, Gastritis, unspecified, with bleeding                           D50.9, Iron deficiency anemia, unspecified                           K92.1, Melena (includes Hematochezia) CPT copyright 2018 [employer]. All rights reserved. The codes documented in this report are preliminary and upon coder review may  be revised to meet current compliance requirements. Carol Ada, MD Carol Ada, MD 07/24/2018 8:22:36 AM This report has been signed electronically. Number of Addenda: 0

## 2018-07-25 DIAGNOSIS — I700 Atherosclerosis of aorta: Secondary | ICD-10-CM | POA: Diagnosis not present

## 2018-07-25 DIAGNOSIS — M810 Age-related osteoporosis without current pathological fracture: Secondary | ICD-10-CM | POA: Diagnosis not present

## 2018-07-25 DIAGNOSIS — D62 Acute posthemorrhagic anemia: Secondary | ICD-10-CM | POA: Diagnosis not present

## 2018-07-25 DIAGNOSIS — E118 Type 2 diabetes mellitus with unspecified complications: Secondary | ICD-10-CM | POA: Diagnosis not present

## 2018-07-25 DIAGNOSIS — D500 Iron deficiency anemia secondary to blood loss (chronic): Secondary | ICD-10-CM | POA: Diagnosis not present

## 2018-07-25 DIAGNOSIS — I428 Other cardiomyopathies: Secondary | ICD-10-CM | POA: Diagnosis not present

## 2018-07-25 DIAGNOSIS — E785 Hyperlipidemia, unspecified: Secondary | ICD-10-CM | POA: Diagnosis not present

## 2018-07-25 DIAGNOSIS — D649 Anemia, unspecified: Secondary | ICD-10-CM | POA: Diagnosis not present

## 2018-07-25 DIAGNOSIS — E1169 Type 2 diabetes mellitus with other specified complication: Secondary | ICD-10-CM | POA: Diagnosis not present

## 2018-07-25 DIAGNOSIS — E1159 Type 2 diabetes mellitus with other circulatory complications: Secondary | ICD-10-CM | POA: Diagnosis not present

## 2018-07-25 LAB — GLUCOSE, CAPILLARY
GLUCOSE-CAPILLARY: 183 mg/dL — AB (ref 70–99)
GLUCOSE-CAPILLARY: 125 mg/dL — AB (ref 70–99)
Glucose-Capillary: 115 mg/dL — ABNORMAL HIGH (ref 70–99)

## 2018-07-25 LAB — BASIC METABOLIC PANEL
Anion gap: 9 (ref 5–15)
BUN: 18 mg/dL (ref 8–23)
CALCIUM: 7 mg/dL — AB (ref 8.9–10.3)
CO2: 23 mmol/L (ref 22–32)
Chloride: 109 mmol/L (ref 98–111)
Creatinine, Ser: 0.98 mg/dL (ref 0.44–1.00)
GFR, EST NON AFRICAN AMERICAN: 52 mL/min — AB (ref >60)
GLUCOSE: 148 mg/dL — AB (ref 70–99)
Potassium: 4.2 mmol/L (ref 3.5–5.1)
SODIUM: 141 mmol/L (ref 135–145)

## 2018-07-25 LAB — CBC WITH DIFFERENTIAL/PLATELET
ABS IMMATURE GRANULOCYTES: 0.09 10*3/uL — AB (ref 0.00–0.07)
BASOS PCT: 1 %
Basophils Absolute: 0 10*3/uL (ref 0.0–0.1)
EOS ABS: 0.2 10*3/uL (ref 0.0–0.5)
Eosinophils Relative: 2 %
HCT: 30.7 % — ABNORMAL LOW (ref 36.0–46.0)
Hemoglobin: 9.2 g/dL — ABNORMAL LOW (ref 12.0–15.0)
IMMATURE GRANULOCYTES: 1 %
LYMPHS ABS: 0.5 10*3/uL — AB (ref 0.7–4.0)
Lymphocytes Relative: 6 %
MCH: 30.2 pg (ref 26.0–34.0)
MCHC: 30 g/dL (ref 30.0–36.0)
MCV: 100.7 fL — AB (ref 80.0–100.0)
MONO ABS: 1.1 10*3/uL — AB (ref 0.1–1.0)
Monocytes Relative: 14 %
Neutro Abs: 6.3 10*3/uL (ref 1.7–7.7)
Neutrophils Relative %: 76 %
PLATELETS: 370 10*3/uL (ref 150–400)
RBC: 3.05 MIL/uL — ABNORMAL LOW (ref 3.87–5.11)
RDW: 17.8 % — AB (ref 11.5–15.5)
WBC: 8.3 10*3/uL (ref 4.0–10.5)
nRBC: 0 % (ref 0.0–0.2)

## 2018-07-25 LAB — PARATHYROID HORMONE, INTACT (NO CA): PTH: 123 pg/mL — ABNORMAL HIGH (ref 15–65)

## 2018-07-25 LAB — VITAMIN D 25 HYDROXY (VIT D DEFICIENCY, FRACTURES): VIT D 25 HYDROXY: 21.4 ng/mL — AB (ref 30.0–100.0)

## 2018-07-25 NOTE — Care Management Obs Status (Signed)
[place] NOTIFICATION   Patient Details  Name: Connie [name] MRN: [id number] Date of Birth: Nov 03, 1933   Medicare Observation Status Notification Given:  Yes    Lynnell Catalan, RN 07/25/2018, 10:21 AM

## 2018-07-25 NOTE — Discharge Summary (Signed)
Discharge Summary  Connie [name] AYT:[id number] DOB: 07-Jun-1934  PCP: [name], MD  Admit date: 07/22/2018 Discharge date: 07/25/2018  Time spent: 30 mins  Recommendations for Outpatient Follow-up:  1. PCP in 1 week 2. GI in 2 weeks  Discharge Diagnoses:  Active Hospital Problems   Diagnosis Date Noted  . Acute blood loss anemia 11/21/2015  . Hypocalcemia   . Symptomatic anemia   . NICM (nonischemic cardiomyopathy) ([name]) 08/03/2016  . Iron deficiency anemia due to chronic blood loss 06/24/2015  . Type 2 diabetes with complication ([name])   . Obesity (BMI 30.0-34.9) 07/25/2014  . Hypertension associated with diabetes ([name]) 03/15/2012  . Hyperlipidemia associated with type 2 diabetes mellitus ([name]) 03/15/2012    Resolved Hospital Problems  No resolved problems to display.    Discharge Condition: Stable  Diet recommendation: heart healthy/mod carb  Vitals:   07/25/18 0535 07/25/18 1006  BP: (!) 121/43 (!) 120/43  Pulse: 66   Resp: 18   Temp: 98.5 F (36.9 C)   SpO2: 97%     History of present illness:  84 year old female with a history of systolic and diastolic CHF/nonischemic cardia myopathy, hypertension, hyperlipidemia, hypothyroidism, diabetes mellitus, iron deficiency anemia, duodenal and gastric AVMs presenting with one-week history of generalized weakness that have worsened since 07/19/2018.  The patient also complained of worsening lower extremity edema for the past 2 3 days prior to admission.  She denied any orthopnea, PND, or increasing abdominal girth but complained of some dyspnea on exertion.  Upon presentation, the patient was noted to have a hemoglobin of 5.8.  She was found to have heme positive stool in the emergency department.  She had hemoglobin of 8.1 on 06/23/2018.  The patient has visual impairment; therefore, she is unable to tell if she is having melena or hematochezia.  The patient has been receiving intermittent IV iron infusions  through Dr. [name].  Since admission, the patient received 2 units PRBC.  GI was consulted to assist with management.   Today, pt reported feeling much better, denies any new complaints, no abdominal pain, SOB, fever/chills, chest pain. Pt crying, very eager to go home.  Hospital Course:  Principal Problem:   Acute blood loss anemia Active Problems:   Hypertension associated with diabetes ([name])   Hyperlipidemia associated with type 2 diabetes mellitus ([name])   Obesity (BMI 30.0-34.9)   Type 2 diabetes with complication ([name])   Iron deficiency anemia due to chronic blood loss   NICM (nonischemic cardiomyopathy) ([name])   Hypocalcemia   Symptomatic anemia  Acute on chronic blood loss anemia/symptomatic anemia likely due to gastric, SB and colonic AVMs -Hgb currently stable s/p 2U of PRBC (baseline hemoglobin 8-9), 5.8 on admission  -Patient has a history of gastric, small bowel, and colonic AVMs.  Last procedure 08/2015 double balloon enteroscopy  -GI on board: Had enteroscopy and colonoscopy on 11/4 which showed: Gastritis with hemorrhage, two non-bleeding colonic angiodysplastic lesions. Advised PPI, follow up in 2 weeks  Lower extremity edema -Likely secondary to hypoalbuminemia Vs CHF -Patient received 2 doses IV furosemide, continue with po lasix  Chronic systolic and diastolic CHF -3/55/7322 echo EF 55 to 60%, grade 1 DD, no WMA, mild AI/MR -Continue carvedilol, lasix, entresto  Diabetes mellitus type 2, controlled -02/07/2018 hemoglobin A 1C--5.8 -Continue Actos  Essential hypertension -Continue carvedilol, amlodipine  Hypocalcemia -Intact PTH -S/P IV calcium gluconate -25 vitamin D, 21.4, PTH 123, ionized Ca pending -Held home calcitonin, until repeat labs done by PCP  Hyperlipidemia -Continue statin   Procedures: Enteroscopy and colonoscopy on 11/4   Consultations:  GI  Discharge Exam: BP (!) 120/43   Pulse 66   Temp 98.5 F (36.9 C) (Oral)   Resp 18    Ht _0  (1.422 m)   Wt 54 kg   SpO2 97%   BMI 26.69 kg/m   General: NAD  Cardiovascular: S1, S2 present Respiratory: CTAB   Discharge Instructions You were cared for by a hospitalist during your hospital stay. If you have any questions about your discharge medications or the care you received while you were in the hospital after you are discharged, you can call the unit and asked to speak with the hospitalist on call if the hospitalist that took care of you is not available. Once you are discharged, your primary care physician will handle any further medical issues. Please note that NO REFILLS for any discharge medications will be authorized once you are discharged, as it is imperative that you return to your primary care physician (or establish a relationship with a primary care physician if you do not have one) for your aftercare needs so that they can reassess your need for medications and monitor your lab values.   Allergies as of 07/25/2018   No Known Allergies     Medication List    STOP taking these medications   calcitonin (salmon) 200 UNIT/ACT nasal spray Commonly known as:  MIACALCIN/FORTICAL   HYDROcodone-acetaminophen 5-325 MG tablet Commonly known as:  NORCO/VICODIN   potassium chloride 10 MEQ tablet Commonly known as:  K-DUR     TAKE these medications   ACCU-CHEK FASTCLIX LANCET Kit 1 each by Does not apply route 2 (two) times daily. DX E11.9   ACCU-CHEK FASTCLIX LANCETS Misc 1 each by Does not apply route 2 (two) times daily. DX E11.9   ACCU-CHEK GUIDE w/Device Kit 1 each by Does not apply route 2 (two) times daily. DX E11.9   acetaminophen 500 MG tablet Commonly known as:  TYLENOL Take 500 mg by mouth every 6 (six) hours as needed for mild pain or headache.   amLODipine 10 MG tablet Commonly known as:  NORVASC TAKE 1 TABLET (10 MG TOTAL) BY MOUTH DAILY. What changed:  See the new instructions.   atorvastatin 40 MG tablet Commonly known as:   LIPITOR TAKE 1 TABLET EVERY DAY   CALCIUM 1000 + D PO Take 1 tablet by mouth.   carvedilol 12.5 MG tablet Commonly known as:  COREG TAKE 1 TABLET (12.5 MG TOTAL) BY MOUTH 2 (TWO) TIMES DAILY WITH A MEAL.   furosemide 40 MG tablet Commonly known as:  LASIX TAKE 1 TABLET (40 MG TOTAL) BY MOUTH DAILY.   glucose blood test strip Patient to test 2 times daily DX E11.9 Use as instructed   levothyroxine 125 MCG tablet Commonly known as:  SYNTHROID, LEVOTHROID TAKE 1 TABLET EVERY DAY   pantoprazole 20 MG tablet Commonly known as:  PROTONIX TAKE 1 TABLET (20 MG TOTAL) BY MOUTH DAILY.   pioglitazone 30 MG tablet Commonly known as:  ACTOS TAKE 1 TABLET (30 MG TOTAL) BY MOUTH DAILY. What changed:  See the new instructions.   potassium chloride 10 MEQ tablet Commonly known as:  K-DUR,KLOR-CON TAKE 1 TABLET TWICE DAILY   sacubitril-valsartan 49-51 MG Commonly known as:  ENTRESTO Take 1 tablet by mouth 2 (two) times daily.   WOMENS MULTIVITAMIN PLUS PO Take 1 tablet by mouth daily.  Durable Medical Equipment  (From admission, onward)         Start     Ordered   07/25/18 1218  For home use only DME 4 wheeled rolling walker with seat  Once    Question:  Patient needs a walker to treat with the following condition  Answer:  Weakness   07/25/18 1217         No Known Allergies Follow-up Information    [name], MD. Schedule an appointment as soon as possible for a visit in 1 week(s).   Specialty:  Family Medicine Contact information: [place] [address] [phone]        Carol Ada, MD. Schedule an appointment as soon as possible for a visit in 2 week(s).   Specialty:  Gastroenterology Contact information: [address] [place] [address] [phone]            [name] (including imaging, microbiology, ancillary and laboratory) are  listed below for reference.    Significant Diagnostic Studies: Dg Chest 2 View  Result Date: 07/22/2018 CLINICAL DATA:  Pt with bilateral lower extremity edema that has been worsening over the past week, pt with hx of CHF. Pt also c/o weakness and intermittent confusion, pt states she is receiving blood transfusions and iron for low hgb, last tran.*comment was truncated*hx chf, weakness EXAM: CHEST - 2 VIEW COMPARISON:  Radiograph 10/21/2016 FINDINGS: Normal cardiac silhouette. Levo kyphosis of the spine lungs are clear. No effusion, infiltrate pneumothorax. IMPRESSION: No acute cardiopulmonary process. Electronically Signed   By: Suzy Bouchard M.D.   On: 07/22/2018 14:54    Microbiology: No results found for this or any previous visit (from the past 240 hour(s)).   Labs: Basic Metabolic Panel: Recent Labs  Lab 07/22/18 1359 07/23/18 0540 07/24/18 0513 07/25/18 0515  NA 145 144 145 141  K 4.5 3.8 3.9 4.2  CL 113* 111 113* 109  CO2 _0 GLUCOSE 140* 97 103* 148*  BUN _1 CREATININE 0.96 0.89 0.92 0.98  CALCIUM 4.8* 4.9* 5.2* 7.0*   Liver Function Tests: Recent Labs  Lab 07/22/18 1359 07/23/18 0540 07/24/18 0513  AST 18 14* 13*  ALT _2 ALKPHOS 85 82 76  BILITOT 0.6 1.1 0.8  PROT 5.7* 5.4* 5.0*  ALBUMIN 2.8* 2.8* 2.3*   No results for input(s): LIPASE, AMYLASE in the last 168 hours. No results for input(s): AMMONIA in the last 168 hours. CBC: Recent Labs  Lab 07/22/18 1359 07/23/18 0540 07/24/18 0513 07/25/18 0515  WBC 11.6* 10.3 8.7 8.3  NEUTROABS  --   --   --  6.3  HGB 5.8* 8.6* 8.8* 9.2*  HCT 20.7* 28.1* 29.1* 30.7*  MCV 108.4* 98.9 99.0 100.7*  PLT 483* 377 366 370   Cardiac Enzymes: No results for input(s): CKTOTAL, CKMB, CKMBINDEX, TROPONINI in the last 168 hours. BNP: BNP (last 3 results) Recent Labs    07/22/18 1406  BNP 189.4*    ProBNP (last 3 results) No results for input(s): PROBNP in the last 8760  hours.  CBG: Recent Labs  Lab 07/24/18 1025 07/24/18 1218 07/24/18 1631 07/24/18 2125 07/25/18 0729  GLUCAP 101* 189* 155* 183* 115*       Signed:  Alma Friendly, MD [facility] 07/25/2018, 12:20 PM

## 2018-07-25 NOTE — Progress Notes (Signed)
Physical Therapy Treatment Patient Details Name: Connie [name] MRN: [id number] DOB: Jun 08, 1934 Today's Date: 07/25/2018    History of Present Illness Pt admitted 2* weakness and LE edema and found to have Hgb of 5.8.  Pt with hx of Bil TKR, anemia, CHF, DM, and cardiomyopathy    PT Comments    Pt OOB in recliner.  Assisted to bathroom at Supervision then amb a great distance in hallway using a 4WW.  Observered walker to be old and missing parts.  Pt stated it belonged to her husband.   Pt demonstarted good safety awareness and mobility.    Follow Up Recommendations   Pt declined any HH services     Equipment Recommendations  (4 [employer] Rollator)  Of her own   Recommendations for Other Services       Precautions / Restrictions Precautions Precautions: Fall Restrictions Weight Bearing Restrictions: No    Mobility  Bed Mobility               General bed mobility comments: OOB in recliner   Transfers Overall transfer level: Needs assistance Equipment used: Rolling walker (2 wheeled) Transfers: Sit to/from [facility] Sit to Stand: Supervision Stand pivot transfers: Supervision       General transfer comment: good safety cognition and use of hands to steady self   Ambulation/Gait Ambulation/Gait assistance: Supervision Gait Distance (Feet): 175 Feet Assistive device: 4-wheeled walker Gait Pattern/deviations: Step-through pattern;Shuffle Gait velocity: decreased    General Gait Details: mild shuffle but steady gait with 4 ww Rollator no LOB and good safety awareness.     Stairs             Wheelchair Mobility    Modified Rankin (Stroke Patients Only)       Balance                                            Cognition Arousal/Alertness: Awake/alert Behavior During Therapy: WFL for tasks assessed/performed Overall Cognitive Status: Within Functional Limits for tasks assessed                                         Exercises      General Comments        Pertinent Vitals/Pain Pain Assessment: No/denies pain    Home Living                      Prior Function            PT Goals (current goals can now be found in the care plan section) Progress towards PT goals: Progressing toward goals    Frequency    Min 3X/week      PT Plan Current plan remains appropriate    Co-evaluation              AM-PAC PT "6 Clicks" Daily Activity  Outcome Measure  Difficulty turning over in bed (including adjusting bedclothes, sheets and blankets)?: A Little Difficulty moving from lying on back to sitting on the side of the bed? : A Little Difficulty sitting down on and standing up from a chair with arms (e.g., wheelchair, bedside commode, etc,.)?: A Little Help needed moving to and from a bed to chair (including a wheelchair)?: A Little Help needed  walking in hospital room?: A Little Help needed climbing 3-5 steps with a railing? : A Little 6 Click Score: 18    End of Session Equipment Utilized During Treatment: Gait belt Activity Tolerance: Patient tolerated treatment well Patient left: in chair;with call bell/phone within reach Nurse Communication: (pt needs a rollator for D/C to home) PT Visit Diagnosis: Muscle weakness (generalized) (M62.81);Difficulty in walking, not elsewhere classified (R26.2);Unsteadiness on feet (R26.81)     Time: 4417-1278 PT Time Calculation (min) (ACUTE ONLY): 16 min  Charges:  $Gait Training: 8-22 mins                     Rica Koyanagi  PTA Acute  Rehabilitation Services Pager      [phone] Office      [phone]

## 2018-07-25 NOTE — Care Management (Signed)
This CM met with pt at bedside for dc planning. Pt declines home health services and states she will be going home with her son at dc. Pt requesting Rollator at discharge. States she has a BSC at home. [facility] rep alerted of DME need.   RN,BSN [phone] 

## 2018-07-25 NOTE — Progress Notes (Signed)
PHYSICAL THERAPY  Pt is uses husbands old rollator which appears very worn and missing some parts.   For a safe D/C to home pt will need a youth Rollator (4 [employer] seat) of her own. Pt is 4'8"   Connie [name]  PTA Acute  Rehabilitation Services Pager      [phone] Office      [phone]

## 2018-07-26 ENCOUNTER — Encounter (HOSPITAL_COMMUNITY): Payer: Self-pay | Admitting: Gastroenterology

## 2018-07-26 ENCOUNTER — Other Ambulatory Visit: Payer: Self-pay | Admitting: *Deleted

## 2018-07-26 ENCOUNTER — Telehealth: Payer: Self-pay

## 2018-07-26 DIAGNOSIS — I428 Other cardiomyopathies: Secondary | ICD-10-CM

## 2018-07-26 LAB — CALCIUM, IONIZED: Calcium, Ionized, Serum: 4.3 mg/dL — ABNORMAL LOW (ref 4.5–5.6)

## 2018-07-26 MED ORDER — SACUBITRIL-VALSARTAN 49-51 MG PO TABS: 1.0000 | ORAL_TABLET | Freq: Two times a day (BID) | ORAL | 0 refills | Status: DC

## 2018-07-26 NOTE — Addendum Note (Signed)
Addended by: Juventino Slovak on: 07/26/2018 01:07 PM   Modules accepted: Orders

## 2018-07-26 NOTE — Telephone Encounter (Signed)
Patients son, [name] called and requested a refill on entresto be sent to [facility]. I made him aware that the patient needs to schedule a follow up appointment as she is overdue. I transferred him to scheduling and an appointment was scheduled for tomorrow. While attempting to reorder the medication, I saw that there was a note that the patient had not started this medication due to insurance but I called and spoke with [name] and he stated that she has been taking the medication and they have been refilling it through [place]. I made him aware that I would send it in as requested. He verbalized his understanding and appreciation.

## 2018-07-26 NOTE — Telephone Encounter (Signed)
I contacted patient to schedule a hospital follow up, spoke with her Son [name]. I have reconciled the current medications and medication changes. Patient was instructed to discontinue Calcitonin, Hydrocodone and potassium chloride but to continue all other medications. Patient and son is knowledgeable of her condition and of all required medication she is taking. Patient is currently living with her son whom is helping with her care. Patient son informed that she is feeling much better. Patient has an appointment scheduled with her PCP on 08/01/2018.

## 2018-07-27 ENCOUNTER — Ambulatory Visit: Payer: Medicare HMO | Admitting: Cardiology

## 2018-07-27 ENCOUNTER — Encounter: Payer: Self-pay | Admitting: Cardiology

## 2018-07-27 VITALS — BP 120/48 | HR 68 | Ht <= 58 in | Wt 121.0 lb

## 2018-07-27 DIAGNOSIS — E7800 Pure hypercholesterolemia, unspecified: Secondary | ICD-10-CM

## 2018-07-27 DIAGNOSIS — R7989 Other specified abnormal findings of blood chemistry: Secondary | ICD-10-CM | POA: Diagnosis not present

## 2018-07-27 DIAGNOSIS — R778 Other specified abnormalities of plasma proteins: Secondary | ICD-10-CM

## 2018-07-27 DIAGNOSIS — I428 Other cardiomyopathies: Secondary | ICD-10-CM | POA: Diagnosis not present

## 2018-07-27 DIAGNOSIS — D649 Anemia, unspecified: Secondary | ICD-10-CM | POA: Diagnosis not present

## 2018-07-27 MED ORDER — SACUBITRIL-VALSARTAN 24-26 MG PO TABS: 1.0000 | ORAL_TABLET | Freq: Two times a day (BID) | ORAL | 11 refills | Status: DC

## 2018-07-27 NOTE — Progress Notes (Signed)
Cardiology Office Note:    Date:  07/27/2018   ID:  Connie [name], DOB 06/26/1934, MRN [id number]  PCP:  Connie Lung, MD  Cardiologist:  No primary care provider on file.  Electrophysiologist:  None   Referring MD: Connie Lung, MD     History of Present Illness:    Connie [name] is a 84 y.o. female here for follow-up of nonischemic cardiomyopathy which seems to have improved significantly with a EF of 55 to 60% on echocardiogram 05/18/2017.  On Connie [name] however her son states that she has not had that medication for maybe 2 months at home.  Connie [name] in our clinic is helping her with patient assistance to get the medication.  She was placed on Entresto in the hospital setting. diabetes followed by Dr. Redmond [name].  Chronic lower extremity edema, on Lasix.  She was in the hospital, discharged on 07/25/2018 with presentation of GI bleed, demonstrating duodenal and gastric AVMs after 1 week history of generalized weakness.  Hemoglobin 5.8.  2 units of blood.  Endoscopy showed gastritis with hemorrhage and 2 nonbleeding colonic angiodysplastic lesions.  PPI prescribed.     Past Medical History:  Diagnosis Date  . Allergy    RHINITIS  . Angiodysplasia of duodenum    hx/notes 11/21/2015  . Angiodysplasia of stomach    hx/notes 11/21/2015  . Arthritis    "joints ache"  . Bleeding gastric ulcer    hx/notes 11/21/2015  . Chronic blood loss anemia    secondary to angiodysplasia of the stomach and duodenum as well as history of bleeding gastric ulcer hx/notes 11/21/2015  . Chronic systolic CHF (congestive heart failure) ([place]) 12/23/2015  . History of blood transfusion 01/2015; 06/2015; 11/21/2015  . Hypercholesteremia   . Hypertension   . Hypothyroidism   . Obesity   . Pneumonia "several times"  . Type II diabetes mellitus ([place])     Past Surgical History:  Procedure Laterality Date  . BIOPSY  07/24/2018   Procedure: BIOPSY;  Surgeon: Connie Ada, MD;  Location: [facility];   Service: Endoscopy;;  . CARDIAC CATHETERIZATION N/A 12/19/2015   Procedure: Left Heart Cath and Coronary Angiography;  Surgeon: Connie Man, MD;  Location: [facility];  Service: Cardiovascular;  Laterality: N/A;  . COLONOSCOPY N/A 08/30/2014   Procedure: COLONOSCOPY;  Surgeon: Connie Beams, MD;  Location: [facility];  Service: Endoscopy;  Laterality: N/A;  . COLONOSCOPY N/A 02/21/2015   Procedure: COLONOSCOPY;  Surgeon: Connie Ada, MD;  Location: [facility] ENDOSCOPY;  Service: Endoscopy;  Laterality: N/A;  . COLONOSCOPY WITH PROPOFOL N/A 07/24/2018   Procedure: COLONOSCOPY WITH PROPOFOL;  Surgeon: Connie Ada, MD;  Location: [facility];  Service: Endoscopy;  Laterality: N/A;  . ENTEROSCOPY N/A 06/06/2015   Procedure: ENTEROSCOPY;  Surgeon: Connie Ada, MD;  Location: [facility];  Service: Endoscopy;  Laterality: N/A;  . ENTEROSCOPY N/A 07/24/2018   Procedure: ENTEROSCOPY;  Surgeon: Connie Ada, MD;  Location: [facility];  Service: Endoscopy;  Laterality: N/A;  . FRACTURE SURGERY    . GIVENS CAPSULE STUDY N/A 05/15/2015   Procedure: GIVENS CAPSULE STUDY;  Surgeon: Connie Ada, MD;  Location: [facility] ENDOSCOPY;  Service: Endoscopy;  Laterality: N/A;  . HOT HEMOSTASIS N/A 08/30/2014   Procedure: HOT HEMOSTASIS (ARGON PLASMA COAGULATION/BICAP);  Surgeon: Connie Beams, MD;  Location: [facility];  Service: Endoscopy;  Laterality: N/A;  . HOT HEMOSTASIS N/A 06/06/2015   Procedure: HOT HEMOSTASIS (ARGON PLASMA COAGULATION/BICAP);  Surgeon: Connie Ada, MD;  Location:  [facility];  Service: Endoscopy;  Laterality: N/A;  . HOT HEMOSTASIS N/A 07/24/2018   Procedure: HOT HEMOSTASIS (ARGON PLASMA COAGULATION/BICAP);  Surgeon: Connie Ada, MD;  Location: [facility];  Service: Endoscopy;  Laterality: N/A;  . JOINT REPLACEMENT    . REVISION TOTAL KNEE ARTHROPLASTY Bilateral 2004-2015  . SHOULDER OPEN ROTATOR CUFF REPAIR Right 12/2004   open subacromial decompression, distal clavicle   resection, rotator cuff repair/notes 02/02/2011  . TONSILLECTOMY  1940s  . TOTAL KNEE ARTHROPLASTY Bilateral ~ 2002  . TOTAL THYROIDECTOMY  ~ 1966    Current Medications: Current Meds  Medication Sig  . ACCU-CHEK FASTCLIX LANCETS MISC 1 each by Does not apply route 2 (two) times daily. DX E11.9  . acetaminophen (TYLENOL) 500 MG tablet Take 500 mg by mouth every 6 (six) hours as needed for mild pain or headache.   Marland Kitchen atorvastatin (LIPITOR) 40 MG tablet TAKE 1 TABLET EVERY DAY  . Blood Glucose Monitoring Suppl (ACCU-CHEK GUIDE) w/Device KIT 1 each by Does not apply route 2 (two) times daily. DX E11.9  . Calcium Carb-Cholecalciferol (CALCIUM 1000 + D PO) Take 1 tablet by mouth.  . carvedilol (COREG) 12.5 MG tablet TAKE 1 TABLET (12.5 MG TOTAL) BY MOUTH 2 (TWO) TIMES DAILY WITH A MEAL.  . furosemide (LASIX) 40 MG tablet TAKE 1 TABLET (40 MG TOTAL) BY MOUTH DAILY.  Marland Kitchen glucose blood (ACCU-CHEK GUIDE) test strip Patient to test 2 times daily DX E11.9 Use as instructed  . Lancets Misc. (ACCU-CHEK FASTCLIX LANCET) KIT 1 each by Does not apply route 2 (two) times daily. DX E11.9  . levothyroxine (SYNTHROID, LEVOTHROID) 125 MCG tablet TAKE 1 TABLET EVERY DAY  . Multiple Vitamins-Minerals (WOMENS MULTIVITAMIN PLUS PO) Take 1 tablet by mouth daily.  . pantoprazole (PROTONIX) 20 MG tablet TAKE 1 TABLET (20 MG TOTAL) BY MOUTH DAILY.  Marland Kitchen pioglitazone (ACTOS) 30 MG tablet TAKE 1 TABLET (30 MG TOTAL) BY MOUTH DAILY.  Marland Kitchen potassium chloride (K-DUR,KLOR-CON) 10 MEQ tablet TAKE 1 TABLET TWICE DAILY  . [DISCONTINUED] amLODipine (NORVASC) 10 MG tablet TAKE 1 TABLET (10 MG TOTAL) BY MOUTH DAILY.  . [DISCONTINUED] sacubitril-valsartan (ENTRESTO) 49-51 MG Take 1 tablet by mouth 2 (two) times daily.     Allergies:   Patient has no known allergies.   Social History   Socioeconomic History  . Marital status: Married    Spouse name: Not on file  . Number of children: Not on file  . Years of education: Not on file  .  Highest education level: Not on file  Occupational History  . Not on file  Social Needs  . Financial resource strain: Not on file  . Food insecurity:    Worry: Not on file    Inability: Not on file  . Transportation needs:    Medical: Not on file    Non-medical: Not on file  Tobacco Use  . Smoking status: Never Smoker  . Smokeless tobacco: Never Used  Substance and Sexual Activity  . Alcohol use: No  . Drug use: No  . Sexual activity: Not Currently  Lifestyle  . Physical activity:    Days per week: Not on file    Minutes per session: Not on file  . Stress: Not on file  Relationships  . Social connections:    Talks on phone: Not on file    Gets together: Not on file    Attends religious service: Not on file    Active member of club or organization: Not  on file    Attends meetings of clubs or organizations: Not on file    Relationship status: Not on file  Other Topics Concern  . Not on file  Social History Narrative  . Not on file     Family History: The patient's family history includes Anemia in her sister; Asthma in her mother; Ulcers in her father.  ROS:   Please see the history of present illness.    Denies any fevers chills nausea vomiting.  She is still quite tired.  Still fairly short of breath with minimal activity.  All other systems reviewed and are negative.  EKGs/Labs/Other Studies Reviewed:    The following studies were reviewed today: Hospital records echocardiogram lab work  EKG:  EKG is not ordered today.    Recent Labs: 02/07/2018: TSH 0.471 07/22/2018: B Natriuretic Peptide 189.4 07/24/2018: ALT 6 07/25/2018: BUN 18; Creatinine, Ser 0.98; Hemoglobin 9.2; Platelets 370; Potassium 4.2; Sodium 141  Recent Lipid Panel    Component Value Date/Time   CHOL 146 10/19/2017 1149   TRIG 111 10/19/2017 1149   HDL 46 10/19/2017 1149   CHOLHDL 3.2 10/19/2017 1149   CHOLHDL 2.8 10/19/2016 1147   VLDL 22 10/19/2016 1147   LDLCALC 78 10/19/2017 1149     Physical Exam:    VS:  BP (!) 120/48   Pulse 68   Ht '4\' 8"'  (1.422 m)   Wt 121 lb (54.9 kg)   SpO2 97%   BMI 27.13 kg/m     Wt Readings from Last 3 Encounters:  07/27/18 121 lb (54.9 kg)  07/24/18 119 lb 0.8 oz (54 kg)  06/23/18 115 lb 9.6 oz (52.4 kg)     GEN: Elderly well nourished, well developed in no acute distress HEENT: Normal NECK: No JVD; No carotid bruits LYMPHATICS: No lymphadenopathy CARDIAC: RRR, no murmurs, rubs, gallops RESPIRATORY:  Clear to auscultation without rales, wheezing or rhonchi  ABDOMEN: Soft, non-tender, non-distended MUSCULOSKELETAL:  No edema; No deformity  SKIN: Warm and dry NEUROLOGIC:  Alert and oriented x 3 PSYCHIATRIC:  Normal affect   ASSESSMENT:    1. NICM (nonischemic cardiomyopathy) ([place])    PLAN:    In order of problems listed above:  Nonischemic cardiomyopathy -Marked improvement with Entresto, EF now 55 to 60% on echocardiogram 05/18/2017.  Continue with carvedilol Lasix Entresto but dose adjust to lower dose 24/26.  Patient assistance form.  Apparently she has been out of this medication at home for quite some time, may be 2 months her son states.  I will stop her amlodipine 10 mg which also can exacerbate her lower extremity edema.  Mild aortic insufficiency/mild mitral regurgitation -Continue to monitor clinically.  Diabetes with hypertension -Hemoglobin A1c 5.8, Actos.  Pioglitazone can cause exacerbation of edema and changing this medication should be considered.  Consider transition to SGLT2 inhibitor such as the dapagliflozin.  Lower extremity edema - Likely exacerbated by amlodipine and potentially Actos.  I have stopped amlodipine.  Consider changing Actos to another diabetic medication as above.  Medication Adjustments/Labs and Tests Ordered: Current medicines are reviewed at length with the patient today.  Concerns regarding medicines are outlined above.  No orders of the defined types were placed in this  encounter.  No orders of the defined types were placed in this encounter.   There are no Patient Instructions on file for this visit.   Signed, Candee Furbish, MD  07/27/2018 11:21 AM    [place]

## 2018-07-27 NOTE — Patient Instructions (Addendum)
Medication Instructions:  Please discontinue your Amlodipine. Start Entresto 24/26 mg twice daily. Continue all other medications as listed.  If you need a refill on your cardiac medications before your next appointment, please call your pharmacy.   Follow-Up: At [facility], you and your health needs are our priority.  As part of our continuing mission to provide you with exceptional heart care, we have created designated Provider Care Teams.  These Care Teams include your primary Cardiologist (physician) and Advanced Practice Providers (APPs -  Physician Assistants and Nurse Practitioners) who all work together to provide you with the care you need, when you need it. You will need a follow up appointment in 3 months with Cecilie Kicks, NP and Dr Marlou Porch in 6 months.  Please call our office 2 months in advance to schedule this appointment.  You may see Dr Candee Furbish or one of the following Advanced Practice Providers on your designated Care Team:   Truitt Merle, NP Cecilie Kicks, NP . Kathyrn Drown, NP  Thank you for choosing [facility]!!

## 2018-08-01 ENCOUNTER — Other Ambulatory Visit: Payer: Self-pay | Admitting: Family Medicine

## 2018-08-01 ENCOUNTER — Ambulatory Visit (INDEPENDENT_AMBULATORY_CARE_PROVIDER_SITE_OTHER): Payer: Medicare HMO | Admitting: Family Medicine

## 2018-08-01 ENCOUNTER — Encounter: Payer: Self-pay | Admitting: Family Medicine

## 2018-08-01 VITALS — BP 150/88 | HR 59 | Temp 98.0°F | Wt 120.8 lb

## 2018-08-01 DIAGNOSIS — K31811 Angiodysplasia of stomach and duodenum with bleeding: Secondary | ICD-10-CM

## 2018-08-01 DIAGNOSIS — H9113 Presbycusis, bilateral: Secondary | ICD-10-CM

## 2018-08-01 DIAGNOSIS — D649 Anemia, unspecified: Secondary | ICD-10-CM

## 2018-08-01 NOTE — Progress Notes (Signed)
   Subjective:    Patient ID: Connie [name], female    DOB: 11-Jan-1934, 84 y.o.   MRN: [id number]  HPI She is here for a recheck.  She was recently admitted to the hospital and treated again for acute blood loss.  She does have AVM that have been causing the bleeding.  She does see hematology regularly for IV iron.  On her last hospital admission her hemoglobin was 5.8.  Also it showed evidence of hypocalcemia.  Presently she is having no abdominal pain, nausea, vomiting, black tarry stools.  She has had difficulty recently with ambulation specifically going up and down steps.  Her son has looked into assisted living however she is resistant to this.  She also is resistant to getting hearing aids but continues have difficulty with that.   Review of Systems     Objective:   Physical Exam Alert and in no distress.  Lower extremity exam shows full motion of the knees and ankles.  Her strength is 3-4 out of 5.  Reflexes were diminished but present.  Normal vibratory sensation.      Assessment & Plan:  Anemia, unspecified type - Plan: CBC with Differential/Platelet, Comprehensive metabolic panel  Hypocalcemia - Plan: CBC with Differential/Platelet, Comprehensive metabolic panel  Angiodysplasia of stomach and duodenum with hemorrhage - Plan: CBC with Differential/Platelet, Comprehensive metabolic panel  Presbycusis of both ears - Plan: Ambulatory referral to Audiology After discussion with her, I recommend that she again look into assisted living as this might be the very best place for her and help with her ambulation.  We will also refer to audiology for hearing aids. Over 25 minutes, greater than 50% spent in counseling and coordination of care.  She will return here in 1 month for recheck.

## 2018-08-02 LAB — CBC WITH DIFFERENTIAL/PLATELET
Basophils Absolute: 0.1 10*3/uL (ref 0.0–0.2)
Basos: 1 %
EOS (ABSOLUTE): 0.2 10*3/uL (ref 0.0–0.4)
EOS: 3 %
HEMATOCRIT: 27.3 % — AB (ref 34.0–46.6)
HEMOGLOBIN: 8.9 g/dL — AB (ref 11.1–15.9)
IMMATURE GRANS (ABS): 0.1 10*3/uL (ref 0.0–0.1)
Immature Granulocytes: 1 %
LYMPHS ABS: 0.5 10*3/uL — AB (ref 0.7–3.1)
Lymphs: 9 %
MCH: 29.9 pg (ref 26.6–33.0)
MCHC: 32.6 g/dL (ref 31.5–35.7)
MCV: 92 fL (ref 79–97)
MONOCYTES: 11 %
MONOS ABS: 0.6 10*3/uL (ref 0.1–0.9)
NEUTROS ABS: 4.2 10*3/uL (ref 1.4–7.0)
Neutrophils: 75 %
Platelets: 307 10*3/uL (ref 150–450)
RBC: 2.98 x10E6/uL — AB (ref 3.77–5.28)
RDW: 14.7 % (ref 12.3–15.4)
WBC: 5.6 10*3/uL (ref 3.4–10.8)

## 2018-08-02 LAB — COMPREHENSIVE METABOLIC PANEL
ALBUMIN: 3.4 g/dL — AB (ref 3.5–4.7)
ALT: 3 IU/L (ref 0–32)
AST: 12 IU/L (ref 0–40)
Albumin/Globulin Ratio: 1.6 (ref 1.2–2.2)
Alkaline Phosphatase: 119 IU/L — ABNORMAL HIGH (ref 39–117)
BUN / CREAT RATIO: 25 (ref 12–28)
BUN: 22 mg/dL (ref 8–27)
Bilirubin Total: 0.3 mg/dL (ref 0.0–1.2)
CO2: 24 mmol/L (ref 20–29)
Calcium: 8.2 mg/dL — ABNORMAL LOW (ref 8.7–10.3)
Chloride: 107 mmol/L — ABNORMAL HIGH (ref 96–106)
Creatinine, Ser: 0.87 mg/dL (ref 0.57–1.00)
GFR calc non Af Amer: 62 mL/min/{1.73_m2} (ref >59)
GFR, EST AFRICAN AMERICAN: 72 mL/min/{1.73_m2} (ref >59)
GLOBULIN, TOTAL: 2.1 g/dL (ref 1.5–4.5)
GLUCOSE: 105 mg/dL — AB (ref 65–99)
Potassium: 4.6 mmol/L (ref 3.5–5.2)
SODIUM: 143 mmol/L (ref 134–144)
TOTAL PROTEIN: 5.5 g/dL — AB (ref 6.0–8.5)

## 2018-08-22 NOTE — Progress Notes (Signed)
Connie Kitchen    HEMATOLOGY/ONCOLOGY CLINIC NOTE  Date of Service: 08/23/18    Patient Care Team: [name], MD as PCP - General (Family Medicine)  CHIEF COMPLAINTS/PURPOSE OF CONSULTATION:  Iron deficiency anemia due to GI bleeding from multiple AVMs  DIAGNOSIS: Iron deficiency anemia likely related to ongoing chronic GI bleeding from multiple AVMs  TREATMENT -IV iron replacement with injectafer as needed (tolerated well). (Had some shortness of breath with IV Feraheme - which was likely fluid overload as opposed to a true allergic reaction but patient has been hesitant to take this)  HISTORY OF PRESENTING ILLNESS:  Please see previous note for details  INTERVAL HISTORY  Connie [name] is here for followup for her IDA. The patient's last visit with [place] was on 06/23/18. She is accompanied today by her son. The pt reports that she is doing well overall.   In the interim, the patient was admitted for symptomatic anemia from 07/22/18 to 07/25/18. She had an endoscopy and colonoscopy on 07/24/18 which was notable for two non-bleeding colonic angiodysplastic lesions treated with a monopolar probe. The patient's small bowel endoscopy revealed gastritis with hemorrhage, negative for H.pylori. The pt notes she will be following up with Dr. Benson Norway in GI later this afternoon.   The pt reports that she has noticed black stools most of the time, and began PO Iron again. She has continue to avoid all NSAIDs. She notes that her energy levels fluctuate, some days are worse and some are better.   Lab results today (08/23/18) of CBC w/diff is as follows: all values are WNL except for RBC at 2.27, HGB at 6.9, HCT at 23.5, MCV at 103.5, MCHC at 29.4, RDW at 17.1, Lymphs abs at 500.  On review of systems, pt reports low energy levels, dark stools, stable breathing, and denies abdominal pains, and any other symptoms.    MEDICAL HISTORY:  Past Medical History:  Diagnosis Date  . Allergy    RHINITIS  .  Angiodysplasia of duodenum    hx/notes 11/21/2015  . Angiodysplasia of stomach    hx/notes 11/21/2015  . Arthritis    "joints ache"  . Bleeding gastric ulcer    hx/notes 11/21/2015  . Chronic blood loss anemia    secondary to angiodysplasia of the stomach and duodenum as well as history of bleeding gastric ulcer hx/notes 11/21/2015  . Chronic systolic CHF (congestive heart failure) ([place]) 12/23/2015  . History of blood transfusion 01/2015; 06/2015; 11/21/2015  . Hypercholesteremia   . Hypertension   . Hypothyroidism   . Obesity   . Pneumonia "several times"  . Type II diabetes mellitus ([place])     SURGICAL HISTORY: Past Surgical History:  Procedure Laterality Date  . BIOPSY  07/24/2018   Procedure: BIOPSY;  Surgeon: Carol Ada, MD;  Location: [facility];  Service: Endoscopy;;  . CARDIAC CATHETERIZATION N/A 12/19/2015   Procedure: Left Heart Cath and Coronary Angiography;  Surgeon: Leonie Man, MD;  Location: [place] [facility];  Service: Cardiovascular;  Laterality: N/A;  . COLONOSCOPY N/A 08/30/2014   Procedure: COLONOSCOPY;  Surgeon: Beryle Beams, MD;  Location: [facility];  Service: Endoscopy;  Laterality: N/A;  . COLONOSCOPY N/A 02/21/2015   Procedure: COLONOSCOPY;  Surgeon: Carol Ada, MD;  Location: [facility] ENDOSCOPY;  Service: Endoscopy;  Laterality: N/A;  . COLONOSCOPY WITH PROPOFOL N/A 07/24/2018   Procedure: COLONOSCOPY WITH PROPOFOL;  Surgeon: Carol Ada, MD;  Location: [facility];  Service: Endoscopy;  Laterality: N/A;  . ENTEROSCOPY N/A 06/06/2015  Procedure: ENTEROSCOPY;  Surgeon: Carol Ada, MD;  Location: [facility];  Service: Endoscopy;  Laterality: N/A;  . ENTEROSCOPY N/A 07/24/2018   Procedure: ENTEROSCOPY;  Surgeon: Carol Ada, MD;  Location: [facility];  Service: Endoscopy;  Laterality: N/A;  . FRACTURE SURGERY    . GIVENS CAPSULE STUDY N/A 05/15/2015   Procedure: GIVENS CAPSULE STUDY;  Surgeon: Carol Ada, MD;  Location: [facility] ENDOSCOPY;  Service:  Endoscopy;  Laterality: N/A;  . HOT HEMOSTASIS N/A 08/30/2014   Procedure: HOT HEMOSTASIS (ARGON PLASMA COAGULATION/BICAP);  Surgeon: Beryle Beams, MD;  Location: [facility];  Service: Endoscopy;  Laterality: N/A;  . HOT HEMOSTASIS N/A 06/06/2015   Procedure: HOT HEMOSTASIS (ARGON PLASMA COAGULATION/BICAP);  Surgeon: Carol Ada, MD;  Location: [facility];  Service: Endoscopy;  Laterality: N/A;  . HOT HEMOSTASIS N/A 07/24/2018   Procedure: HOT HEMOSTASIS (ARGON PLASMA COAGULATION/BICAP);  Surgeon: Carol Ada, MD;  Location: [facility]OS[facility];  Service: Endoscopy;  Laterality: N/A;  . JOINT REPLACEMENT    . REVISION TOTAL KNEE ARTHROPLASTY Bilateral 2004-2015  . SHOULDER OPEN ROTATOR CUFF REPAIR Right 12/2004   open subacromial decompression, distal clavicle  resection, rotator cuff repair/notes 02/02/2011  . TONSILLECTOMY  1940s  . TOTAL KNEE ARTHROPLASTY Bilateral ~ 2002  . TOTAL THYROIDECTOMY  ~ 1966    SOCIAL HISTORY: Social History   Socioeconomic History  . Marital status: Married    Spouse name: Not on file  . Number of children: Not on file  . Years of education: Not on file  . Highest education level: Not on file  Occupational History  . Not on file  Social Needs  . Financial resource strain: Not on file  . Food insecurity:    Worry: Not on file    Inability: Not on file  . Transportation needs:    Medical: Not on file    Non-medical: Not on file  Tobacco Use  . Smoking status: Never Smoker  . Smokeless tobacco: Never Used  Substance and Sexual Activity  . Alcohol use: No  . Drug use: No  . Sexual activity: Not Currently  Lifestyle  . Physical activity:    Days per week: Not on file    Minutes per session: Not on file  . Stress: Not on file  Relationships  . Social connections:    Talks on phone: Not on file    Gets together: Not on file    Attends religious service: Not on file    Active member of club or organization: Not on file    Attends meetings of  clubs or organizations: Not on file    Relationship status: Not on file  . Intimate partner violence:    Fear of current or ex partner: Not on file    Emotionally abused: Not on file    Physically abused: Not on file    Forced sexual activity: Not on file  Other Topics Concern  . Not on file  Social History Narrative  . Not on file    FAMILY HISTORY: Family History  Problem Relation Age of Onset  . Asthma Mother   . Ulcers Father   . Anemia Sister     ALLERGIES:  has No Known Allergies.  MEDICATIONS:  Current Outpatient Medications  Medication Sig Dispense Refill  . ACCU-CHEK FASTCLIX LANCETS MISC 1 each by Does not apply route 2 (two) times daily. DX E11.9 204 each 3  . acetaminophen (TYLENOL) 500 MG tablet Take 500 mg by mouth every 6 (six) hours  as needed for mild pain or headache.     Connie Kitchen atorvastatin (LIPITOR) 40 MG tablet TAKE 1 TABLET EVERY DAY 90 tablet 3  . Blood Glucose Monitoring Suppl (ACCU-CHEK GUIDE) w/Device KIT 1 each by Does not apply route 2 (two) times daily. DX E11.9 1 kit 0  . Calcium Carb-Cholecalciferol (CALCIUM 1000 + D PO) Take 1 tablet by mouth.    . carvedilol (COREG) 12.5 MG tablet TAKE 1 TABLET (12.5 MG TOTAL) BY MOUTH 2 (TWO) TIMES DAILY WITH A MEAL. 180 tablet 1  . furosemide (LASIX) 40 MG tablet TAKE 1 TABLET BY MOUTH DAILY. 90 tablet 0  . glucose blood (ACCU-CHEK GUIDE) test strip Patient to test 2 times daily DX E11.9 Use as instructed 200 each 3  . Lancets Misc. (ACCU-CHEK FASTCLIX LANCET) KIT 1 each by Does not apply route 2 (two) times daily. DX E11.9 1 kit 0  . levothyroxine (SYNTHROID, LEVOTHROID) 125 MCG tablet TAKE 1 TABLET EVERY DAY 90 tablet 3  . Multiple Vitamins-Minerals (WOMENS MULTIVITAMIN PLUS PO) Take 1 tablet by mouth daily.    . pantoprazole (PROTONIX) 20 MG tablet TAKE 1 TABLET (20 MG TOTAL) BY MOUTH DAILY. 90 tablet 3  . pioglitazone (ACTOS) 30 MG tablet TAKE 1 TABLET BY MOUTH DAILY  90 tablet 0  . potassium chloride  (K-DUR,KLOR-CON) 10 MEQ tablet TAKE 1 TABLET TWICE DAILY 180 tablet 1  . sacubitril-valsartan (ENTRESTO) 24-26 MG Take 1 tablet by mouth 2 (two) times daily. 60 tablet 11   No current facility-administered medications for this visit.     REVIEW OF SYSTEMS:    A 10+ POINT REVIEW OF SYSTEMS WAS OBTAINED including neurology, dermatology, psychiatry, cardiac, respiratory, lymph, extremities, GI, GU, Musculoskeletal, constitutional, breasts, reproductive, HEENT.  All pertinent positives are noted in the HPI.  All others are negative.   PHYSICAL EXAMINATION:  ECOG PERFORMANCE STATUS: 1 - Symptomatic but completely ambulatory  Vitals:   08/23/18 0902 08/23/18 0903  BP: (!) 194/49 (!) 160/43  Pulse: 70   Resp: 18   Temp: 97.8 F (36.6 C)   SpO2: 100%    Filed Weights   08/23/18 0902  Weight: 123 lb 1.6 oz (55.8 kg)   .Body mass index is 27.6 kg/m.  GENERAL:alert, in no acute distress and comfortable SKIN: no acute rashes, no significant lesions EYES: conjunctiva are pink and non-injected, sclera anicteric OROPHARYNX: MMM, no exudates, no oropharyngeal erythema or ulceration NECK: supple, no JVD LYMPH:  no palpable lymphadenopathy in the cervical, axillary or inguinal regions LUNGS: clear to auscultation b/l with normal respiratory effort HEART: regular rate & rhythm ABDOMEN:  normoactive bowel sounds , non tender, not distended. No palpable hepatosplenomegaly.  Extremity: no pedal edema PSYCH: alert & oriented x 3 with fluent speech NEURO: no focal motor/sensory deficits   LABORATORY DATA:    CBC Latest Ref Rng & Units 08/23/2018 08/01/2018 07/25/2018  WBC 4.0 - 10.5 K/uL 8.2 5.6 8.3  Hemoglobin 12.0 - 15.0 g/dL 6.9(LL) 8.9(L) 9.2(L)  Hematocrit 36.0 - 46.0 % 23.5(L) 27.3(L) 30.7(L)  Platelets 150 - 400 K/uL 298 307 370    . CBC    Component Value Date/Time   WBC 8.2 08/23/2018 0840   RBC 2.27 (L) 08/23/2018 0840   HGB 6.9 (LL) 08/23/2018 0840   HGB 8.9 (L) 08/01/2018  1217   HGB 10.8 (L) 06/20/2017 1326   HCT 23.5 (L) 08/23/2018 0840   HCT 27.3 (L) 08/01/2018 1217   HCT 34.9 06/20/2017 1326   PLT 298  08/23/2018 0840   PLT 307 08/01/2018 1217   MCV 103.5 (H) 08/23/2018 0840   MCV 92 08/01/2018 1217   MCV 94.8 06/20/2017 1326   MCH 30.4 08/23/2018 0840   MCHC 29.4 (L) 08/23/2018 0840   RDW 17.1 (H) 08/23/2018 0840   RDW 14.7 08/01/2018 1217   RDW 14.2 06/20/2017 1326   LYMPHSABS 0.5 (L) 08/23/2018 0840   LYMPHSABS 0.5 (L) 08/01/2018 1217   LYMPHSABS 0.6 (L) 06/20/2017 1326   MONOABS 0.7 08/23/2018 0840   MONOABS 0.7 06/20/2017 1326   EOSABS 0.3 08/23/2018 0840   EOSABS 0.2 08/01/2018 1217   BASOSABS 0.0 08/23/2018 0840   BASOSABS 0.1 08/01/2018 1217   BASOSABS 0.0 06/20/2017 1326    . CMP Latest Ref Rng & Units 08/01/2018 07/25/2018 07/24/2018  Glucose 65 - 99 mg/dL 105(H) 148(H) 103(H)  BUN 8 - 27 mg/dL _0 Creatinine 0.57 - 1.00 mg/dL 0.87 0.98 0.92  Sodium 134 - 144 mmol/L 143 141 145  Potassium 3.5 - 5.2 mmol/L 4.6 4.2 3.9  Chloride 96 - 106 mmol/L 107(H) 109 113(H)  CO2 20 - 29 mmol/L _1 Calcium 8.7 - 10.3 mg/dL 8.2(L) 7.0(L) 5.2(LL)  Total Protein 6.0 - 8.5 g/dL 5.5(L) - 5.0(L)  Total Bilirubin 0.0 - 1.2 mg/dL 0.3 - 0.8  Alkaline Phos 39 - 117 IU/L 119(H) - 76  AST 0 - 40 IU/L 12 - 13(L)  ALT 0 - 32 IU/L 3 - 6   . Lab Results  Component Value Date   IRON 55 08/23/2018   TIBC 199 (L) 08/23/2018   IRONPCTSAT 28 08/23/2018   (Iron and TIBC)  Lab Results  Component Value Date   FERRITIN 497 (H) 08/23/2018       ASSESSMENT & PLAN:   84 y.o. Caucasian female with  #1 Microcytic anemia likely related to iron deficiency from chronic GI bleeding. No known disorder causing chronic inflammation at this time.  #2 history of recurrent GI bleeding related to upper and lower GI tract AVMs and gastric erosions. Has had several admissions with requirement of multiple PRBC transfusions. Dropping ferritin suggests  ongoing slow GI losses. No clinically overt GI bleeding noted.  #3 Hypothyroidism on levothyroxine .  #4 ?? Reaction to [facility] - current hospital admission that shortness of breath appears to be more likely related to CHF decompensation than allergic reaction to Feraheme. However patient is very anxious and hesitant to get this preparation. Switched to and tolerated Injectafer  PLAN:  -Absolutely no NSAIDs, only Tylenol for mild pain   -Discussed pt labwork today, 08/23/18; HGB decreased to 6.9 down from 8.9 one month ago -Pt has lost equivalent of 2 pints of blood in the past month  -Hold PO iron as this makes it difficulty to differentiate the cause of her dark stool color, and we are replacing Iron IV -Recent colonoscopy from 07/24/18 revealed two non-bleeding angiodysplastic lesions which were treated with a monopolar probe  -Recent small bowel endoscopy from 07/24/18 revealed gastritis with hemorrhage, negative for H.pylori -Continue follow up with Dr. Benson Norway in GI today 08/23/18, to address source of blood loss -Will order IV Injectafer x1 dose -Will order 2 units PRBCs -Will continue to watch labs once a month  -Will continue with once a month IV Injectafer and tracking Ferritin for indication of frequency of infusions   Continue IV Injectafer q4weeks as scheduled 2 units of PRBC today/tommorrow RTC with Dr [name] with labs with next dose of IV  Injectafer in 1 month Schedule time for PRBC transfusion on f/u in 1 month     All of the patients questions were answered with apparent satisfaction. The patient knows to call the clinic with any problems, questions or concerns.  The total time spent in the appt was 25 minutes and more than 50% was on counseling and direct patient cares.     Sullivan Lone MD [place] AAHIVMS [facility] Hematology/Oncology Physician [facility]  (Office):       [phone] (Work cell):  [phone] (Fax):           [phone]  I, Baldwin [place],  am acting as a scribe for Dr. Sullivan Lone.   .I have reviewed the above documentation for accuracy and completeness, and I agree with the above. Brunetta Genera MD

## 2018-08-23 ENCOUNTER — Telehealth: Payer: Self-pay | Admitting: Hematology

## 2018-08-23 ENCOUNTER — Inpatient Hospital Stay: Payer: Medicare HMO

## 2018-08-23 ENCOUNTER — Other Ambulatory Visit: Payer: Self-pay | Admitting: *Deleted

## 2018-08-23 ENCOUNTER — Inpatient Hospital Stay (HOSPITAL_BASED_OUTPATIENT_CLINIC_OR_DEPARTMENT_OTHER): Payer: Medicare HMO | Admitting: Hematology

## 2018-08-23 ENCOUNTER — Inpatient Hospital Stay: Payer: Medicare HMO | Attending: Hematology

## 2018-08-23 ENCOUNTER — Inpatient Hospital Stay (HOSPITAL_BASED_OUTPATIENT_CLINIC_OR_DEPARTMENT_OTHER): Payer: Medicare HMO | Admitting: Medical

## 2018-08-23 VITALS — BP 160/43 | HR 70 | Temp 97.8°F | Resp 18 | Ht <= 58 in | Wt 123.1 lb

## 2018-08-23 VITALS — BP 179/49 | HR 59 | Temp 98.1°F | Resp 16

## 2018-08-23 DIAGNOSIS — D500 Iron deficiency anemia secondary to blood loss (chronic): Secondary | ICD-10-CM

## 2018-08-23 DIAGNOSIS — E039 Hypothyroidism, unspecified: Secondary | ICD-10-CM | POA: Diagnosis not present

## 2018-08-23 DIAGNOSIS — E119 Type 2 diabetes mellitus without complications: Secondary | ICD-10-CM

## 2018-08-23 DIAGNOSIS — E669 Obesity, unspecified: Secondary | ICD-10-CM

## 2018-08-23 DIAGNOSIS — I5020 Unspecified systolic (congestive) heart failure: Secondary | ICD-10-CM | POA: Diagnosis not present

## 2018-08-23 DIAGNOSIS — D62 Acute posthemorrhagic anemia: Secondary | ICD-10-CM

## 2018-08-23 DIAGNOSIS — I10 Essential (primary) hypertension: Secondary | ICD-10-CM

## 2018-08-23 DIAGNOSIS — Z79899 Other long term (current) drug therapy: Secondary | ICD-10-CM | POA: Diagnosis not present

## 2018-08-23 DIAGNOSIS — I110 Hypertensive heart disease with heart failure: Secondary | ICD-10-CM

## 2018-08-23 LAB — CMP (CANCER CENTER ONLY)
ALBUMIN: 3.2 g/dL — AB (ref 3.5–5.0)
ALT: <6 U/L (ref 0–44)
ANION GAP: 9 (ref 5–15)
AST: 15 U/L (ref 15–41)
Alkaline Phosphatase: 109 U/L (ref 38–126)
BILIRUBIN TOTAL: 0.4 mg/dL (ref 0.3–1.2)
BUN: 21 mg/dL (ref 8–23)
CO2: 25 mmol/L (ref 22–32)
Calcium: 7.8 mg/dL — ABNORMAL LOW (ref 8.9–10.3)
Chloride: 110 mmol/L (ref 98–111)
Creatinine: 0.98 mg/dL (ref 0.44–1.00)
GFR, Estimated: 54 mL/min — ABNORMAL LOW (ref >60)
GLUCOSE: 183 mg/dL — AB (ref 70–99)
POTASSIUM: 4 mmol/L (ref 3.5–5.1)
SODIUM: 144 mmol/L (ref 135–145)
TOTAL PROTEIN: 6.1 g/dL — AB (ref 6.5–8.1)

## 2018-08-23 LAB — CBC WITH DIFFERENTIAL/PLATELET
ABS IMMATURE GRANULOCYTES: 0.07 10*3/uL (ref 0.00–0.07)
BASOS ABS: 0 10*3/uL (ref 0.0–0.1)
BASOS PCT: 1 %
EOS ABS: 0.3 10*3/uL (ref 0.0–0.5)
Eosinophils Relative: 4 %
HCT: 23.5 % — ABNORMAL LOW (ref 36.0–46.0)
Hemoglobin: 6.9 g/dL — CL (ref 12.0–15.0)
IMMATURE GRANULOCYTES: 1 %
Lymphocytes Relative: 6 %
Lymphs Abs: 0.5 10*3/uL — ABNORMAL LOW (ref 0.7–4.0)
MCH: 30.4 pg (ref 26.0–34.0)
MCHC: 29.4 g/dL — ABNORMAL LOW (ref 30.0–36.0)
MCV: 103.5 fL — ABNORMAL HIGH (ref 80.0–100.0)
MONOS PCT: 8 %
Monocytes Absolute: 0.7 10*3/uL (ref 0.1–1.0)
NEUTROS ABS: 6.6 10*3/uL (ref 1.7–7.7)
NEUTROS PCT: 80 %
NRBC: 0 % (ref 0.0–0.2)
Platelets: 298 10*3/uL (ref 150–400)
RBC: 2.27 MIL/uL — AB (ref 3.87–5.11)
RDW: 17.1 % — AB (ref 11.5–15.5)
WBC: 8.2 10*3/uL (ref 4.0–10.5)

## 2018-08-23 LAB — IRON AND TIBC
Iron: 55 ug/dL (ref 41–142)
SATURATION RATIOS: 28 % (ref 21–57)
TIBC: 199 ug/dL — AB (ref 236–444)
UIBC: 144 ug/dL (ref 120–384)

## 2018-08-23 LAB — FERRITIN: Ferritin: 497 ng/mL — ABNORMAL HIGH (ref 11–307)

## 2018-08-23 LAB — PREPARE RBC (CROSSMATCH)

## 2018-08-23 LAB — SAMPLE TO BLOOD BANK

## 2018-08-23 MED ORDER — SODIUM CHLORIDE 0.9 % IV SOLN
Freq: Once | INTRAVENOUS | Status: AC
  Administered 2018-08-23: 11:00:00 via INTRAVENOUS
  Filled 2018-08-23: qty 250

## 2018-08-23 MED ORDER — DIPHENHYDRAMINE HCL 25 MG PO CAPS
25.0000 mg | ORAL_CAPSULE | Freq: Once | ORAL | Status: AC
  Administered 2018-08-23: 25 mg via ORAL

## 2018-08-23 MED ORDER — DIPHENHYDRAMINE HCL 25 MG PO CAPS
ORAL_CAPSULE | ORAL | Status: AC
  Filled 2018-08-23: qty 2

## 2018-08-23 MED ORDER — ACETAMINOPHEN 325 MG PO TABS
ORAL_TABLET | ORAL | Status: AC
  Filled 2018-08-23: qty 2

## 2018-08-23 MED ORDER — CLONIDINE HCL 0.1 MG PO TABS
ORAL_TABLET | ORAL | Status: AC
  Filled 2018-08-23: qty 1

## 2018-08-23 MED ORDER — ACETAMINOPHEN 325 MG PO TABS
650.0000 mg | ORAL_TABLET | Freq: Once | ORAL | Status: AC
  Administered 2018-08-23: 650 mg via ORAL

## 2018-08-23 MED ORDER — SODIUM CHLORIDE 0.9% IV SOLUTION
250.0000 mL | Freq: Once | INTRAVENOUS | Status: AC
  Administered 2018-08-23: 250 mL via INTRAVENOUS
  Filled 2018-08-23: qty 250

## 2018-08-23 MED ORDER — SODIUM CHLORIDE 0.9 % IV SOLN
750.0000 mg | Freq: Once | INTRAVENOUS | Status: AC
  Administered 2018-08-23: 750 mg via INTRAVENOUS
  Filled 2018-08-23: qty 15

## 2018-08-23 MED ORDER — CLONIDINE HCL 0.1 MG PO TABS
0.1000 mg | ORAL_TABLET | Freq: Once | ORAL | Status: AC
  Administered 2018-08-23: 0.1 mg via ORAL

## 2018-08-23 MED ORDER — DIPHENHYDRAMINE HCL 25 MG PO CAPS
ORAL_CAPSULE | ORAL | Status: AC
  Filled 2018-08-23: qty 1

## 2018-08-23 NOTE — Progress Notes (Signed)
Post treatment BP elevated. Patient denies any complaints. Sandi Mealy, PA saw patient in infusion room and orders received. Medicated as documented in [facility]. Sandi Mealy returned to infusion to evaluate. Advised ok to discharge.

## 2018-08-23 NOTE — Telephone Encounter (Signed)
Printed calendar and avs. °

## 2018-08-23 NOTE — Patient Instructions (Signed)
Blood Transfusion, Adult A blood transfusion is a procedure in which you receive donated blood, including plasma, platelets, and red blood cells, through an IV tube. You may need a blood transfusion because of illness, surgery, or injury. The blood may come from a donor. You may also be able to donate blood for yourself (autologous blood donation) before a surgery if you know that you might require a blood transfusion. The blood given in a transfusion is made up of different types of cells. You may receive:  Red blood cells. These carry oxygen to the cells in the body.  White blood cells. These help you fight infections.  Platelets. These help your blood to clot.  Plasma. This is the liquid part of your blood and it helps with fluid imbalances.  If you have hemophilia or another clotting disorder, you may also receive other types of blood products. Tell a health care provider about:  Any allergies you have.  All medicines you are taking, including vitamins, herbs, eye drops, creams, and over-the-counter medicines.  Any problems you or family members have had with anesthetic medicines.  Any blood disorders you have.  Any surgeries you have had.  Any medical conditions you have, including any recent fever or cold symptoms.  Whether you are pregnant or may be pregnant.  Any previous reactions you have had during a blood transfusion. What are the risks? Generally, this is a safe procedure. However, problems may occur, including:  Having an allergic reaction to something in the donated blood. Hives and itching may be symptoms of this type of reaction.  Fever. This may be a reaction to the white blood cells in the transfused blood. Nausea or chest pain may accompany a fever.  Iron overload. This can happen from having many transfusions.  Transfusion-related acute lung injury (TRALI). This is a rare reaction that causes lung damage. The cause is not known.TRALI can occur within hours  of a transfusion or several days later.  Sudden (acute) or delayed hemolytic reactions. This happens if your blood does not match the cells in your transfusion. Your body's defense system (immune system) may try to attack the new cells. This complication is rare. The symptoms include fever, chills, nausea, and low back pain or chest pain.  Infection or disease transmission. This is rare.  What happens before the procedure?  You will have a blood test to determine your blood type. This is necessary to know what kind of blood your body will accept and to match it to the donor blood.  If you are going to have a planned surgery, you may be able to do an autologous blood donation. This may be done in case you need to have a transfusion.  If you have had an allergic reaction to a transfusion in the past, you may be given medicine to help prevent a reaction. This medicine may be given to you by mouth or through an IV tube.  You will have your temperature, blood pressure, and pulse monitored before the transfusion.  Follow instructions from your health care provider about eating and drinking restrictions.  Ask your health care provider about: ? Changing or stopping your regular medicines. This is especially important if you are taking diabetes medicines or blood thinners. ? Taking medicines such as aspirin and ibuprofen. These medicines can thin your blood. Do not take these medicines before your procedure if your health care provider instructs you not to. What happens during the procedure?  An IV tube will   be inserted into one of your veins.  The bag of donated blood will be attached to your IV tube. The blood will then enter through your vein.  Your temperature, blood pressure, and pulse will be monitored regularly during the transfusion. This monitoring is done to detect early signs of a transfusion reaction.  If you have any signs or symptoms of a reaction, your transfusion will be stopped  and you may be given medicine.  When the transfusion is complete, your IV tube will be removed.  Pressure may be applied to the IV site for a few minutes.  A bandage (dressing) will be applied. The procedure may vary among health care providers and hospitals. What happens after the procedure?  Your temperature, blood pressure, heart rate, breathing rate, and blood oxygen level will be monitored often.  Your blood may be tested to see how you are responding to the transfusion.  You may be warmed with fluids or blankets to maintain a normal body temperature. Summary  A blood transfusion is a procedure in which you receive donated blood, including plasma, platelets, and red blood cells, through an IV tube.  Your temperature, blood pressure, and pulse will be monitored before, during, and after the transfusion.  Your blood may be tested after the transfusion to see how your body has responded. This information is not intended to replace advice given to you by your health care provider. Make sure you discuss any questions you have with your health care provider. Document Released: 09/03/2000 Document Revised: 06/03/2016 Document Reviewed: 06/03/2016 Elsevier Interactive Patient Education  2018 [employer].  Ferric carboxymaltose injection What is this medicine? FERRIC CARBOXYMALTOSE (ferr-ik car-box-ee-mol-toes) is an iron complex. Iron is used to make healthy red blood cells, which carry oxygen and nutrients throughout the body. This medicine is used to treat anemia in people with chronic kidney disease or people who cannot take iron by mouth. This medicine may be used for other purposes; ask your health care provider or pharmacist if you have questions. COMMON BRAND NAME(S): Injectafer What should I tell my health care provider before I take this medicine? They need to know if you have any of these conditions: -anemia not caused by low iron levels -high levels of iron in the  blood -liver disease -an unusual or allergic reaction to iron, other medicines, foods, dyes, or preservatives -pregnant or trying to get pregnant -breast-feeding How should I use this medicine? This medicine is for infusion into a vein. It is given by a health care professional in a hospital or clinic setting. Talk to your pediatrician regarding the use of this medicine in children. Special care may be needed. Overdosage: If you think you have taken too much of this medicine contact a poison control center or emergency room at once. NOTE: This medicine is only for you. Do not share this medicine with others. What if I miss a dose? It is important not to miss your dose. Call your doctor or health care professional if you are unable to keep an appointment. What may interact with this medicine? Do not take this medicine with any of the following medications: -deferoxamine -dimercaprol -other iron products This medicine may also interact with the following medications: -chloramphenicol -deferasirox This list may not describe all possible interactions. Give your health care provider a list of all the medicines, herbs, non-prescription drugs, or dietary supplements you use. Also tell them if you smoke, drink alcohol, or use illegal drugs. Some items may interact with your  medicine. What should I watch for while using this medicine? Visit your doctor or health care professional regularly. Tell your doctor if your symptoms do not start to get better or if they get worse. You may need blood work done while you are taking this medicine. You may need to follow a special diet. Talk to your doctor. Foods that contain iron include: whole grains/cereals, dried fruits, beans, or peas, leafy green vegetables, and organ meats (liver, kidney). What side effects may I notice from receiving this medicine? Side effects that you should report to your doctor or health care professional as soon as  possible: -allergic reactions like skin rash, itching or hives, swelling of the face, lips, or tongue -breathing problems -changes in blood pressure -feeling faint or lightheaded, falls -flushing, sweating, or hot feelings Side effects that usually do not require medical attention (report to your doctor or health care professional if they continue or are bothersome): -changes in taste -constipation -dizziness -headache -nausea -pain, redness, or irritation at site where injected -vomiting This list may not describe all possible side effects. Call your doctor for medical advice about side effects. You may report side effects to FDA at 1-800-FDA-1088. Where should I keep my medicine? This drug is given in a hospital or clinic and will not be stored at home. NOTE: This sheet is a summary. It may not cover all possible information. If you have questions about this medicine, talk to your doctor, pharmacist, or health care provider.  2018 Elsevier/Gold Standard (2015-10-09 11:20:47)

## 2018-08-24 LAB — TYPE AND SCREEN
ABO/RH(D): A POS
ANTIBODY SCREEN: NEGATIVE
UNIT DIVISION: 0
UNIT DIVISION: 0

## 2018-08-24 LAB — BPAM RBC
BLOOD PRODUCT EXPIRATION DATE: 201912242359
Blood Product Expiration Date: 201912242359
ISSUE DATE / TIME: 201912041047
ISSUE DATE / TIME: 201912041047
UNIT TYPE AND RH: 6200
UNIT TYPE AND RH: 6200

## 2018-08-24 NOTE — Progress Notes (Signed)
Mrs. Barkalow is an 84 year old female with a history of iron deficiency anemia who is managed by Dr. Jenell Milliner.  She received iron and 2 units of packed red blood cells today.  The patient has a history of hypertension and is on Coreg 12.5 mg, Lasix 40 mg, and Entresto.  I was asked to see the patient while she was in the infusion room.  It was noted that her blood pressure was 185/61 with a pulse of 63.  She was asymptomatic with chest pain or shortness of breath.  She was given 0.1 of clonidine with a repeat blood pressure returned at 179/46.  The patient was told to push fluids and was cautioned regarding positional orthostasis.  She was told to resume her blood pressure medications when she returned home today.  Patient is an 84 year old female who appears to be in no acute distress. She was alert and oriented. Lungs were clear to auscultation bilaterally without wheezes rales or rhonchi. Cardiovascular shows regular rate and rhythm without murmurs rubs or gallops.  The patient was discharged home without any concerns.  Sandi Mealy, MHS, PA-C Physician Assistant

## 2018-08-28 ENCOUNTER — Other Ambulatory Visit: Payer: Self-pay | Admitting: Family Medicine

## 2018-08-28 DIAGNOSIS — D500 Iron deficiency anemia secondary to blood loss (chronic): Secondary | ICD-10-CM | POA: Diagnosis not present

## 2018-08-28 DIAGNOSIS — E785 Hyperlipidemia, unspecified: Principal | ICD-10-CM

## 2018-08-28 DIAGNOSIS — E1169 Type 2 diabetes mellitus with other specified complication: Secondary | ICD-10-CM

## 2018-08-29 DIAGNOSIS — Z9841 Cataract extraction status, right eye: Secondary | ICD-10-CM | POA: Diagnosis not present

## 2018-08-29 DIAGNOSIS — H5202 Hypermetropia, left eye: Secondary | ICD-10-CM | POA: Diagnosis not present

## 2018-08-29 DIAGNOSIS — Z7984 Long term (current) use of oral hypoglycemic drugs: Secondary | ICD-10-CM | POA: Diagnosis not present

## 2018-08-29 DIAGNOSIS — Z961 Presence of intraocular lens: Secondary | ICD-10-CM | POA: Diagnosis not present

## 2018-08-29 DIAGNOSIS — H524 Presbyopia: Secondary | ICD-10-CM | POA: Diagnosis not present

## 2018-08-29 DIAGNOSIS — H353211 Exudative age-related macular degeneration, right eye, with active choroidal neovascularization: Secondary | ICD-10-CM | POA: Diagnosis not present

## 2018-08-29 DIAGNOSIS — E119 Type 2 diabetes mellitus without complications: Secondary | ICD-10-CM | POA: Diagnosis not present

## 2018-08-29 DIAGNOSIS — H52222 Regular astigmatism, left eye: Secondary | ICD-10-CM | POA: Diagnosis not present

## 2018-08-29 DIAGNOSIS — Z9842 Cataract extraction status, left eye: Secondary | ICD-10-CM | POA: Diagnosis not present

## 2018-08-29 DIAGNOSIS — H353121 Nonexudative age-related macular degeneration, left eye, early dry stage: Secondary | ICD-10-CM | POA: Diagnosis not present

## 2018-08-29 LAB — HM DIABETES EYE EXAM

## 2018-09-01 DIAGNOSIS — H43813 Vitreous degeneration, bilateral: Secondary | ICD-10-CM | POA: Diagnosis not present

## 2018-09-01 DIAGNOSIS — H353212 Exudative age-related macular degeneration, right eye, with inactive choroidal neovascularization: Secondary | ICD-10-CM | POA: Diagnosis not present

## 2018-09-01 DIAGNOSIS — H353124 Nonexudative age-related macular degeneration, left eye, advanced atrophic with subfoveal involvement: Secondary | ICD-10-CM | POA: Diagnosis not present

## 2018-09-05 ENCOUNTER — Ambulatory Visit: Payer: Medicare HMO | Admitting: Family Medicine

## 2018-09-21 ENCOUNTER — Ambulatory Visit
Admission: RE | Admit: 2018-09-21 | Discharge: 2018-09-21 | Disposition: A | Discharge status: Home / Self Care | Payer: Medicare HMO | Source: Ambulatory Visit | Attending: Family Medicine | Admitting: Family Medicine

## 2018-09-21 ENCOUNTER — Encounter: Payer: Self-pay | Admitting: Family Medicine

## 2018-09-21 ENCOUNTER — Ambulatory Visit (INDEPENDENT_AMBULATORY_CARE_PROVIDER_SITE_OTHER): Payer: Medicare HMO | Admitting: Family Medicine

## 2018-09-21 VITALS — BP 124/78 | HR 64 | Temp 98.1°F | Wt 134.6 lb

## 2018-09-21 DIAGNOSIS — I509 Heart failure, unspecified: Secondary | ICD-10-CM | POA: Diagnosis not present

## 2018-09-21 DIAGNOSIS — I5022 Chronic systolic (congestive) heart failure: Secondary | ICD-10-CM | POA: Diagnosis not present

## 2018-09-21 DIAGNOSIS — R0609 Other forms of dyspnea: Secondary | ICD-10-CM | POA: Diagnosis not present

## 2018-09-21 DIAGNOSIS — I428 Other cardiomyopathies: Secondary | ICD-10-CM | POA: Diagnosis not present

## 2018-09-21 NOTE — Progress Notes (Signed)
Connie [name]    HEMATOLOGY/ONCOLOGY CLINIC NOTE  Date of Service: 09/22/18    Patient Care Team: [name], MD as PCP - General (Family Medicine)  CHIEF COMPLAINTS/PURPOSE OF CONSULTATION:  Iron deficiency anemia due to GI bleeding from multiple AVMs  DIAGNOSIS: Iron deficiency anemia likely related to ongoing chronic GI bleeding from multiple AVMs  TREATMENT -IV iron replacement with injectafer as needed (tolerated well). (Had some shortness of breath with IV Feraheme - which was likely fluid overload as opposed to a true allergic reaction but patient has been hesitant to take this)  HISTORY OF PRESENTING ILLNESS:  Please see previous note for details  INTERVAL HISTORY  Connie [name] is here for followup for her IDA. The patient's last visit with [place] was on 08/23/18. She is accompanied today by her son. The pt reports that she is doing well overall.   The pt reports that her energy is "sometimes good, sometimes not so good, about the same." She has not not noticed any blood in her stools and denies black stools as well. She notes that she has been eating well. She has continued on a water pill, though misplaced her medication for a few days, and endorses ankle swelling this morning.   The pt has moved her bowels twice this morning, noting that her stools have been loose today. She also hasn't eaten anything this morning and notes that she felt a little queasy after her blood was drawn, but is improving. She notes that she will be seeing Dr. Benson Norway in GI next week on 09/25/18.   Lab results today (09/22/18) of CBC w/diff and CMP is as follows: all values are WNL except for RBC at 2.57, HGB at 7.7, HCT at 26.3, MCV at 102.3, MCHC at 29.3, RDW at 17.0, Lymphs abs at 500, Chloride at 113, CO2 at 21, Glucose at 147, Calcium at 7.1, Total Protein at 6.1, Albumin at 3.1.  On review of systems, pt reports eating well, stable energy levels overall, recent loose stools, ankle swelling, and denies blood in  the stools, black stools, and any other symptoms.   MEDICAL HISTORY:  Past Medical History:  Diagnosis Date  . Allergy    RHINITIS  . Angiodysplasia of duodenum    hx/notes 11/21/2015  . Angiodysplasia of stomach    hx/notes 11/21/2015  . Arthritis    "joints ache"  . Bleeding gastric ulcer    hx/notes 11/21/2015  . Chronic blood loss anemia    secondary to angiodysplasia of the stomach and duodenum as well as history of bleeding gastric ulcer hx/notes 11/21/2015  . Chronic systolic CHF (congestive heart failure) ([place]) 12/23/2015  . History of blood transfusion 01/2015; 06/2015; 11/21/2015  . Hypercholesteremia   . Hypertension   . Hypothyroidism   . Obesity   . Pneumonia "several times"  . Type II diabetes mellitus ([place])     SURGICAL HISTORY: Past Surgical History:  Procedure Laterality Date  . BIOPSY  07/24/2018   Procedure: BIOPSY;  Surgeon: Carol Ada, MD;  Location: [facility];  Service: Endoscopy;;  . CARDIAC CATHETERIZATION N/A 12/19/2015   Procedure: Left Heart Cath and Coronary Angiography;  Surgeon: Leonie Man, MD;  Location: [facility];  Service: Cardiovascular;  Laterality: N/A;  . COLONOSCOPY N/A 08/30/2014   Procedure: COLONOSCOPY;  Surgeon: Beryle Beams, MD;  Location: [facility];  Service: Endoscopy;  Laterality: N/A;  . COLONOSCOPY N/A 02/21/2015   Procedure: COLONOSCOPY;  Surgeon: Carol Ada, MD;  Location: [facility]  ENDOSCOPY;  Service: Endoscopy;  Laterality: N/A;  . COLONOSCOPY WITH PROPOFOL N/A 07/24/2018   Procedure: COLONOSCOPY WITH PROPOFOL;  Surgeon: Carol Ada, MD;  Location: [facility];  Service: Endoscopy;  Laterality: N/A;  . ENTEROSCOPY N/A 06/06/2015   Procedure: ENTEROSCOPY;  Surgeon: Carol Ada, MD;  Location: [facility];  Service: Endoscopy;  Laterality: N/A;  . ENTEROSCOPY N/A 07/24/2018   Procedure: ENTEROSCOPY;  Surgeon: Carol Ada, MD;  Location: [facility];  Service: Endoscopy;  Laterality: N/A;  . FRACTURE SURGERY    .  GIVENS CAPSULE STUDY N/A 05/15/2015   Procedure: GIVENS CAPSULE STUDY;  Surgeon: Carol Ada, MD;  Location: [facility] ENDOSCOPY;  Service: Endoscopy;  Laterality: N/A;  . HOT HEMOSTASIS N/A 08/30/2014   Procedure: HOT HEMOSTASIS (ARGON PLASMA COAGULATION/BICAP);  Surgeon: Beryle Beams, MD;  Location: [facility];  Service: Endoscopy;  Laterality: N/A;  . HOT HEMOSTASIS N/A 06/06/2015   Procedure: HOT HEMOSTASIS (ARGON PLASMA COAGULATION/BICAP);  Surgeon: Carol Ada, MD;  Location: [facility];  Service: Endoscopy;  Laterality: N/A;  . HOT HEMOSTASIS N/A 07/24/2018   Procedure: HOT HEMOSTASIS (ARGON PLASMA COAGULATION/BICAP);  Surgeon: Carol Ada, MD;  Location: [facility];  Service: Endoscopy;  Laterality: N/A;  . JOINT REPLACEMENT    . REVISION TOTAL KNEE ARTHROPLASTY Bilateral 2004-2015  . SHOULDER OPEN ROTATOR CUFF REPAIR Right 12/2004   open subacromial decompression, distal clavicle  resection, rotator cuff repair/notes 02/02/2011  . TONSILLECTOMY  1940s  . TOTAL KNEE ARTHROPLASTY Bilateral ~ 2002  . TOTAL THYROIDECTOMY  ~ 1966    SOCIAL HISTORY: Social History   Socioeconomic History  . Marital status: Married    Spouse name: Not on file  . Number of children: Not on file  . Years of education: Not on file  . Highest education level: Not on file  Occupational History  . Not on file  Social Needs  . Financial resource strain: Not on file  . Food insecurity:    Worry: Not on file    Inability: Not on file  . Transportation needs:    Medical: Not on file    Non-medical: Not on file  Tobacco Use  . Smoking status: Never Smoker  . Smokeless tobacco: Never Used  Substance and Sexual Activity  . Alcohol use: No  . Drug use: No  . Sexual activity: Not Currently  Lifestyle  . Physical activity:    Days per week: Not on file    Minutes per session: Not on file  . Stress: Not on file  Relationships  . Social connections:    Talks on phone: Not on file    Gets together:  Not on file    Attends religious service: Not on file    Active member of club or organization: Not on file    Attends meetings of clubs or organizations: Not on file    Relationship status: Not on file  . Intimate partner violence:    Fear of current or ex partner: Not on file    Emotionally abused: Not on file    Physically abused: Not on file    Forced sexual activity: Not on file  Other Topics Concern  . Not on file  Social History Narrative  . Not on file    FAMILY HISTORY: Family History  Problem Relation Age of Onset  . Asthma Mother   . Ulcers Father   . Anemia Sister     ALLERGIES:  has No Known Allergies.  MEDICATIONS:  Current Outpatient Medications  Medication Sig Dispense Refill  . ACCU-CHEK FASTCLIX LANCETS MISC 1 each by Does not apply route 2 (two) times daily. DX E11.9 204 each 3  . acetaminophen (TYLENOL) 500 MG tablet Take 500 mg by mouth every 6 (six) hours as needed for mild pain or headache.     Connie [name] atorvastatin (LIPITOR) 40 MG tablet TAKE 1 TABLET EVERY DAY 90 tablet 0  . Blood Glucose Monitoring Suppl (ACCU-CHEK GUIDE) w/Device KIT 1 each by Does not apply route 2 (two) times daily. DX E11.9 1 kit 0  . Calcium Carb-Cholecalciferol (CALCIUM 1000 + D PO) Take 1 tablet by mouth.    . carvedilol (COREG) 12.5 MG tablet TAKE 1 TABLET (12.5 MG TOTAL) BY MOUTH 2 (TWO) TIMES DAILY WITH A MEAL. 180 tablet 1  . furosemide (LASIX) 40 MG tablet TAKE 1 TABLET BY MOUTH DAILY. 90 tablet 0  . glucose blood (ACCU-CHEK GUIDE) test strip Patient to test 2 times daily DX E11.9 Use as instructed 200 each 3  . Lancets Misc. (ACCU-CHEK FASTCLIX LANCET) KIT 1 each by Does not apply route 2 (two) times daily. DX E11.9 1 kit 0  . levothyroxine (SYNTHROID, LEVOTHROID) 125 MCG tablet TAKE 1 TABLET EVERY DAY 90 tablet 3  . Multiple Vitamins-Minerals (WOMENS MULTIVITAMIN PLUS PO) Take 1 tablet by mouth daily.    . pantoprazole (PROTONIX) 20 MG tablet TAKE 1 TABLET (20 MG TOTAL) BY  MOUTH DAILY. 90 tablet 3  . pioglitazone (ACTOS) 30 MG tablet TAKE 1 TABLET BY MOUTH DAILY  90 tablet 0  . potassium chloride (K-DUR,KLOR-CON) 10 MEQ tablet TAKE 1 TABLET TWICE DAILY 180 tablet 1  . sacubitril-valsartan (ENTRESTO) 24-26 MG Take 1 tablet by mouth 2 (two) times daily. 60 tablet 11   No current facility-administered medications for this visit.     REVIEW OF SYSTEMS:    A 10+ POINT REVIEW OF SYSTEMS WAS OBTAINED including neurology, dermatology, psychiatry, cardiac, respiratory, lymph, extremities, GI, GU, Musculoskeletal, constitutional, breasts, reproductive, HEENT.  All pertinent positives are noted in the HPI.  All others are negative.   PHYSICAL EXAMINATION:  ECOG PERFORMANCE STATUS: 1 - Symptomatic but completely ambulatory  Vitals:   09/22/18 0853  BP: (!) 170/45  Pulse: 60  Resp: 17  Temp: 97.7 F (36.5 C)  SpO2: 99%   Filed Weights   09/22/18 0853  Weight: 131 lb 8 oz (59.6 kg)   .Body mass index is 29.48 kg/m.  GENERAL:alert, in no acute distress and comfortable SKIN: no acute rashes, no significant lesions EYES: conjunctiva are pink and non-injected, sclera anicteric OROPHARYNX: MMM, no exudates, no oropharyngeal erythema or ulceration NECK: supple, no JVD LYMPH:  no palpable lymphadenopathy in the cervical, axillary or inguinal regions LUNGS: clear to auscultation b/l with normal respiratory effort HEART: regular rate & rhythm ABDOMEN:  normoactive bowel sounds , non tender, not distended. No palpable hepatosplenomegaly.  Extremity: no pedal edema PSYCH: alert & oriented x 3 with fluent speech NEURO: no focal motor/sensory deficits   LABORATORY DATA:    CBC Latest Ref Rng & Units 09/22/2018 08/23/2018 08/01/2018  WBC 4.0 - 10.5 K/uL 8.7 8.2 5.6  Hemoglobin 12.0 - 15.0 g/dL 7.7(L) 6.9(LL) 8.9(L)  Hematocrit 36.0 - 46.0 % 26.3(L) 23.5(L) 27.3(L)  Platelets 150 - 400 K/uL 303 298 307    . CBC    Component Value Date/Time   WBC 8.7  09/22/2018 0812   RBC 2.57 (L) 09/22/2018 0812   HGB 7.7 (L) 09/22/2018 0812   HGB 8.9 (  L) 08/01/2018 1217   HGB 10.8 (L) 06/20/2017 1326   HCT 26.3 (L) 09/22/2018 0812   HCT 27.3 (L) 08/01/2018 1217   HCT 34.9 06/20/2017 1326   PLT 303 09/22/2018 0812   PLT 307 08/01/2018 1217   MCV 102.3 (H) 09/22/2018 0812   MCV 92 08/01/2018 1217   MCV 94.8 06/20/2017 1326   MCH 30.0 09/22/2018 0812   MCHC 29.3 (L) 09/22/2018 0812   RDW 17.0 (H) 09/22/2018 0812   RDW 14.7 08/01/2018 1217   RDW 14.2 06/20/2017 1326   LYMPHSABS 0.5 (L) 09/22/2018 0812   LYMPHSABS 0.5 (L) 08/01/2018 1217   LYMPHSABS 0.6 (L) 06/20/2017 1326   MONOABS 0.8 09/22/2018 0812   MONOABS 0.7 06/20/2017 1326   EOSABS 0.3 09/22/2018 0812   EOSABS 0.2 08/01/2018 1217   BASOSABS 0.0 09/22/2018 0812   BASOSABS 0.1 08/01/2018 1217   BASOSABS 0.0 06/20/2017 1326    . CMP Latest Ref Rng & Units 09/22/2018 08/23/2018 08/01/2018  Glucose 70 - 99 mg/dL 147(H) 183(H) 105(H)  BUN 8 - 23 mg/dL _0 Creatinine 0.44 - 1.00 mg/dL 0.85 0.98 0.87  Sodium 135 - 145 mmol/L 143 144 143  Potassium 3.5 - 5.1 mmol/L 4.1 4.0 4.6  Chloride 98 - 111 mmol/L 113(H) 110 107(H)  CO2 22 - 32 mmol/L 21(L) 25 24  Calcium 8.9 - 10.3 mg/dL 7.1(L) 7.8(L) 8.2(L)  Total Protein 6.5 - 8.1 g/dL 6.1(L) 6.1(L) 5.5(L)  Total Bilirubin 0.3 - 1.2 mg/dL 0.5 0.4 0.3  Alkaline Phos 38 - 126 U/L 117 109 119(H)  AST 15 - 41 U/L _1 ALT 0 - 44 U/L 8 <6 3   . Lab Results  Component Value Date   IRON 55 08/23/2018   TIBC 199 (L) 08/23/2018   IRONPCTSAT 28 08/23/2018   (Iron and TIBC)  Lab Results  Component Value Date   FERRITIN 497 (H) 08/23/2018       ASSESSMENT & PLAN:   85 y.o. Caucasian female with  #1 Microcytic anemia likely related to iron deficiency from chronic GI bleeding. No known disorder causing chronic inflammation at this time.  #2 history of recurrent GI bleeding related to upper and lower GI tract AVMs and gastric  erosions. Has had several admissions with requirement of multiple PRBC transfusions. Dropping ferritin suggests ongoing slow GI losses. No clinically overt GI bleeding noted.  #3 Hypothyroidism on levothyroxine .  #4 ?? Reaction to [facility] - current hospital admission that shortness of breath appears to be more likely related to CHF decompensation than allergic reaction to Feraheme. However patient is very anxious and hesitant to get this preparation. Switched to and tolerated Injectafer  PLAN:  -Discussed pt labwork today, 09/22/18; HGB at 7.7, blood counts and chemistries are stable -09/22/18 Ferritin is pending  -Rate of HGB decrease in the past month suggests that the pt has lost a little more than a pint of blood over that time -Continue Vitamin B complex -Will orders PRBCs, and IV Lasix given ankle swelling, and to prevent fluid overload -Proceed with IV Injectafer today -Continue follow up with Dr. Benson Norway in GI on 09/25/18 to address source of blood loss -Absolutely no NSAIDs, only Tylenol for mild pain   -Hold PO iron as this makes it difficulty to differentiate the cause of her dark stool color, and we are replacing Iron IV -Last colonoscopy from 07/24/18 revealed two non-bleeding angiodysplastic lesions which were treated with a monopolar probe  -Last small  bowel endoscopy from 07/24/18 revealed gastritis with hemorrhage, negative for H.pylori -Will continue to watch labs once a month  -Will continue with once a month IV Injectafer and tracking Ferritin for indication of frequency of infusions -Will see the pt back in 2 months   1 unit of PRBC transfusion today Labs and 1 unit of PRBC transfusion in 1 month plz schedule monthly IV Iron infusion x 4 RTC with Dr [name] with labs, IV Iron and 1 unit of PRBC in 2 months   All of the patients questions were answered with apparent satisfaction. The patient knows to call the clinic with any problems, questions or concerns.  The total time  spent in the appt was 25 minutes and more than 50% was on counseling and direct patient cares.    Sullivan Lone MD [place] AAHIVMS [facility] Hematology/Oncology Physician [facility]  (Office):       [phone] (Work cell):  [phone] (Fax):           [phone]  I, Baldwin [place], am acting as a scribe for Dr. Sullivan Lone.   .I have reviewed the above documentation for accuracy and completeness, and I agree with the above. Brunetta Genera MD

## 2018-09-21 NOTE — Progress Notes (Addendum)
   Subjective:    Patient ID: Connie [name], female    DOB: 02-Mar-1934, 85 y.o.   MRN: [id number]  HPI She is here for recheck.  She is scheduled for blood work and transfusion tomorrow at oncology.  She is now wearing hearing aids which she does not like.  They are starting the process of looking for assisted living although she is definitely resistant to this. Of note today is affected over the last several days she has noted increased peripheral edema which she has a previous history of but also some difficulty with wheezing and shortness of breath.  She does have a previous history of CHF with ischemic cardiomyopathy.   Review of Systems     Objective:   Physical Exam Alert and in no distress.  Not tachypneic.  Cardiac exam does show a systolic ejection murmur.  Lungs are clear to auscultation.  Lower extremity exam shows 3+ pitting edema. EKG shows no acute changes      Assessment & Plan:  Chronic systolic CHF (congestive heart failure) ([place]) - Plan: EKG 12-Lead, Brain natriuretic peptide, DG Chest 2 View  NICM (nonischemic cardiomyopathy) ([place]) - Plan: EKG 12-Lead, Brain natriuretic peptide, DG Chest 2 View  DOE (dyspnea on exertion) - Plan: EKG 12-Lead, Brain natriuretic peptide, DG Chest 2 View Since this has occurred relatively quickly I think looking had her from a cardiac point of view is reasonable.  I will reassess this tomorrow with the results of x-ray and blood work  1/3 her son called last evening telling me that she had not been taking her Lasix as scheduled.  I asked him to start her back on Lasix and we will follow-up with check again on Monday

## 2018-09-22 ENCOUNTER — Ambulatory Visit: Payer: Medicare HMO

## 2018-09-22 ENCOUNTER — Telehealth: Payer: Self-pay | Admitting: Hematology

## 2018-09-22 ENCOUNTER — Encounter: Payer: Self-pay | Admitting: Hematology

## 2018-09-22 ENCOUNTER — Other Ambulatory Visit: Payer: Medicare HMO

## 2018-09-22 ENCOUNTER — Other Ambulatory Visit: Payer: Self-pay | Admitting: *Deleted

## 2018-09-22 ENCOUNTER — Inpatient Hospital Stay (HOSPITAL_BASED_OUTPATIENT_CLINIC_OR_DEPARTMENT_OTHER): Payer: Medicare HMO | Admitting: Hematology

## 2018-09-22 ENCOUNTER — Inpatient Hospital Stay: Payer: Medicare HMO

## 2018-09-22 ENCOUNTER — Ambulatory Visit: Payer: Medicare HMO | Admitting: Hematology

## 2018-09-22 ENCOUNTER — Inpatient Hospital Stay: Payer: Medicare HMO | Attending: Hematology

## 2018-09-22 VITALS — BP 154/56 | HR 64 | Temp 97.9°F | Resp 17

## 2018-09-22 VITALS — BP 170/45 | HR 60 | Temp 97.7°F | Resp 17 | Ht <= 58 in | Wt 131.5 lb

## 2018-09-22 DIAGNOSIS — D649 Anemia, unspecified: Secondary | ICD-10-CM | POA: Diagnosis not present

## 2018-09-22 DIAGNOSIS — E669 Obesity, unspecified: Secondary | ICD-10-CM | POA: Insufficient documentation

## 2018-09-22 DIAGNOSIS — I5020 Unspecified systolic (congestive) heart failure: Secondary | ICD-10-CM

## 2018-09-22 DIAGNOSIS — E039 Hypothyroidism, unspecified: Secondary | ICD-10-CM

## 2018-09-22 DIAGNOSIS — D500 Iron deficiency anemia secondary to blood loss (chronic): Secondary | ICD-10-CM

## 2018-09-22 DIAGNOSIS — E119 Type 2 diabetes mellitus without complications: Secondary | ICD-10-CM | POA: Insufficient documentation

## 2018-09-22 DIAGNOSIS — K922 Gastrointestinal hemorrhage, unspecified: Secondary | ICD-10-CM | POA: Diagnosis not present

## 2018-09-22 DIAGNOSIS — I110 Hypertensive heart disease with heart failure: Secondary | ICD-10-CM | POA: Diagnosis not present

## 2018-09-22 DIAGNOSIS — D509 Iron deficiency anemia, unspecified: Secondary | ICD-10-CM | POA: Diagnosis not present

## 2018-09-22 DIAGNOSIS — D62 Acute posthemorrhagic anemia: Secondary | ICD-10-CM

## 2018-09-22 LAB — CMP (CANCER CENTER ONLY)
ALT: 8 U/L (ref 0–44)
AST: 18 U/L (ref 15–41)
Albumin: 3.1 g/dL — ABNORMAL LOW (ref 3.5–5.0)
Alkaline Phosphatase: 117 U/L (ref 38–126)
Anion gap: 9 (ref 5–15)
BUN: 14 mg/dL (ref 8–23)
CO2: 21 mmol/L — ABNORMAL LOW (ref 22–32)
Calcium: 7.1 mg/dL — ABNORMAL LOW (ref 8.9–10.3)
Chloride: 113 mmol/L — ABNORMAL HIGH (ref 98–111)
Creatinine: 0.85 mg/dL (ref 0.44–1.00)
GFR, Est AFR Am: >60 mL/min (ref >60)
GFR, Estimated: >60 mL/min (ref >60)
Glucose, Bld: 147 mg/dL — ABNORMAL HIGH (ref 70–99)
POTASSIUM: 4.1 mmol/L (ref 3.5–5.1)
Sodium: 143 mmol/L (ref 135–145)
Total Bilirubin: 0.5 mg/dL (ref 0.3–1.2)
Total Protein: 6.1 g/dL — ABNORMAL LOW (ref 6.5–8.1)

## 2018-09-22 LAB — CBC WITH DIFFERENTIAL/PLATELET
Abs Immature Granulocytes: 0.04 10*3/uL (ref 0.00–0.07)
Basophils Absolute: 0 10*3/uL (ref 0.0–0.1)
Basophils Relative: 1 %
Eosinophils Absolute: 0.3 10*3/uL (ref 0.0–0.5)
Eosinophils Relative: 4 %
HCT: 26.3 % — ABNORMAL LOW (ref 36.0–46.0)
Hemoglobin: 7.7 g/dL — ABNORMAL LOW (ref 12.0–15.0)
Immature Granulocytes: 1 %
Lymphocytes Relative: 6 %
Lymphs Abs: 0.5 10*3/uL — ABNORMAL LOW (ref 0.7–4.0)
MCH: 30 pg (ref 26.0–34.0)
MCHC: 29.3 g/dL — ABNORMAL LOW (ref 30.0–36.0)
MCV: 102.3 fL — ABNORMAL HIGH (ref 80.0–100.0)
Monocytes Absolute: 0.8 10*3/uL (ref 0.1–1.0)
Monocytes Relative: 9 %
Neutro Abs: 7 10*3/uL (ref 1.7–7.7)
Neutrophils Relative %: 79 %
Platelets: 303 10*3/uL (ref 150–400)
RBC: 2.57 MIL/uL — AB (ref 3.87–5.11)
RDW: 17 % — ABNORMAL HIGH (ref 11.5–15.5)
WBC: 8.7 10*3/uL (ref 4.0–10.5)
nRBC: 0 % (ref 0.0–0.2)

## 2018-09-22 LAB — FERRITIN: Ferritin: 895 ng/mL — ABNORMAL HIGH (ref 11–307)

## 2018-09-22 LAB — IRON AND TIBC
Iron: 38 ug/dL — ABNORMAL LOW (ref 41–142)
Saturation Ratios: 20 % — ABNORMAL LOW (ref 21–57)
TIBC: 189 ug/dL — ABNORMAL LOW (ref 236–444)
UIBC: 151 ug/dL (ref 120–384)

## 2018-09-22 LAB — SAMPLE TO BLOOD BANK

## 2018-09-22 LAB — PREPARE RBC (CROSSMATCH)

## 2018-09-22 LAB — BRAIN NATRIURETIC PEPTIDE: BNP: 362.8 pg/mL — ABNORMAL HIGH (ref 0.0–100.0)

## 2018-09-22 MED ORDER — SODIUM CHLORIDE 0.9% IV SOLUTION
250.0000 mL | Freq: Once | INTRAVENOUS | Status: AC
  Administered 2018-09-22: 250 mL via INTRAVENOUS
  Filled 2018-09-22: qty 250

## 2018-09-22 MED ORDER — ACETAMINOPHEN 325 MG PO TABS
ORAL_TABLET | ORAL | Status: AC
  Filled 2018-09-22: qty 2

## 2018-09-22 MED ORDER — ONDANSETRON HCL 8 MG PO TABS
8.0000 mg | ORAL_TABLET | Freq: Once | ORAL | Status: AC
  Administered 2018-09-22: 8 mg via ORAL

## 2018-09-22 MED ORDER — FUROSEMIDE 10 MG/ML IJ SOLN
20.0000 mg | Freq: Once | INTRAMUSCULAR | Status: AC
  Administered 2018-09-22: 20 mg via INTRAVENOUS

## 2018-09-22 MED ORDER — ONDANSETRON HCL 8 MG PO TABS
ORAL_TABLET | ORAL | Status: AC
  Filled 2018-09-22: qty 1

## 2018-09-22 MED ORDER — ACETAMINOPHEN 325 MG PO TABS
650.0000 mg | ORAL_TABLET | Freq: Once | ORAL | Status: AC
  Administered 2018-09-22: 650 mg via ORAL

## 2018-09-22 MED ORDER — SODIUM CHLORIDE 0.9 % IV SOLN
Freq: Once | INTRAVENOUS | Status: AC
  Administered 2018-09-22: 10:00:00 via INTRAVENOUS
  Filled 2018-09-22: qty 250

## 2018-09-22 MED ORDER — SODIUM CHLORIDE 0.9 % IV SOLN
750.0000 mg | Freq: Once | INTRAVENOUS | Status: AC
  Administered 2018-09-22: 750 mg via INTRAVENOUS
  Filled 2018-09-22: qty 15

## 2018-09-22 MED ORDER — FUROSEMIDE 10 MG/ML IJ SOLN
INTRAMUSCULAR | Status: AC
  Filled 2018-09-22: qty 2

## 2018-09-22 MED ORDER — DIPHENHYDRAMINE HCL 25 MG PO CAPS
25.0000 mg | ORAL_CAPSULE | Freq: Once | ORAL | Status: AC
  Administered 2018-09-22: 25 mg via ORAL

## 2018-09-22 NOTE — Patient Instructions (Signed)
Ferric carboxymaltose injection What is this medicine? FERRIC CARBOXYMALTOSE (ferr-ik car-box-ee-mol-toes) is an iron complex. Iron is used to make healthy red blood cells, which carry oxygen and nutrients throughout the body. This medicine is used to treat anemia in people with chronic kidney disease or people who cannot take iron by mouth. This medicine may be used for other purposes; ask your health care provider or pharmacist if you have questions. COMMON BRAND NAME(S): Injectafer What should I tell my health care provider before I take this medicine? They need to know if you have any of these conditions: -high levels of iron in the blood -liver disease -an unusual or allergic reaction to iron, other medicines, foods, dyes, or preservatives -pregnant or trying to get pregnant -breast-feeding How should I use this medicine? This medicine is for infusion into a vein. It is given by a health care professional in a hospital or clinic setting. Talk to your pediatrician regarding the use of this medicine in children. Special care may be needed. Overdosage: If you think you have taken too much of this medicine contact a poison control center or emergency room at once. NOTE: This medicine is only for you. Do not share this medicine with others. What if I miss a dose? It is important not to miss your dose. Call your doctor or health care professional if you are unable to keep an appointment. What may interact with this medicine? Do not take this medicine with any of the following medications: -deferoxamine -dimercaprol -other iron products This list may not describe all possible interactions. Give your health care provider a list of all the medicines, herbs, non-prescription drugs, or dietary supplements you use. Also tell them if you smoke, drink alcohol, or use illegal drugs. Some items may interact with your medicine. What should I watch for while using this medicine? Visit your doctor or  health care professional regularly. Tell your doctor if your symptoms do not start to get better or if they get worse. You may need blood work done while you are taking this medicine. You may need to follow a special diet. Talk to your doctor. Foods that contain iron include: whole grains/cereals, dried fruits, beans, or peas, leafy green vegetables, and organ meats (liver, kidney). What side effects may I notice from receiving this medicine? Side effects that you should report to your doctor or health care professional as soon as possible: -allergic reactions like skin rash, itching or hives, swelling of the face, lips, or tongue -dizziness -facial flushing Side effects that usually do not require medical attention (report to your doctor or health care professional if they continue or are bothersome): -changes in taste -constipation -headache -nausea, vomiting -pain, redness, or irritation at site where injected This list may not describe all possible side effects. Call your doctor for medical advice about side effects. You may report side effects to FDA at 1-800-FDA-1088. Where should I keep my medicine? This drug is given in a hospital or clinic and will not be stored at home. NOTE: This sheet is a summary. It may not cover all possible information. If you have questions about this medicine, talk to your doctor, pharmacist, or health care provider.  2019 Elsevier/Gold Standard (2016-10-21 09:40:29)   Blood Transfusion, Adult, Care After This sheet gives you information about how to care for yourself after your procedure. Your doctor may also give you more specific instructions. If you have problems or questions, contact your doctor. Follow these instructions at home:   Take   over-the-counter and prescription medicines only as told by your doctor.  Go back to your normal activities as told by your doctor.  Follow instructions from your doctor about how to take care of the area where an  IV tube was put into your vein (insertion site). Make sure you: ? Wash your hands with soap and water before you change your bandage (dressing). If there is no soap and water, use hand sanitizer. ? Change your bandage as told by your doctor.  Check your IV insertion site every day for signs of infection. Check for: ? More redness, swelling, or pain. ? More fluid or blood. ? Warmth. ? Pus or a bad smell. Contact a doctor if:  You have more redness, swelling, or pain around the IV insertion site.  You have more fluid or blood coming from the IV insertion site.  Your IV insertion site feels warm to the touch.  You have pus or a bad smell coming from the IV insertion site.  Your pee (urine) turns pink, red, or brown.  You feel weak after doing your normal activities. Get help right away if:  You have signs of a serious allergic or body defense (immune) system reaction, including: ? Itchiness. ? Hives. ? Trouble breathing. ? Anxiety. ? Pain in your chest or lower back. ? Fever, flushing, and chills. ? Fast pulse. ? Rash. ? Watery poop (diarrhea). ? Throwing up (vomiting). ? Dark pee. ? Serious headache. ? Dizziness. ? Stiff neck. ? Yellow color in your face or the white parts of your eyes (jaundice). Summary  After a blood transfusion, return to your normal activities as told by your doctor.  Every day, check for signs of infection where the IV tube was put into your vein.  Some signs of infection are warm skin, more redness and pain, more fluid or blood, and pus or a bad smell where the needle went in.  Contact your doctor if you feel weak or have any unusual symptoms. This information is not intended to replace advice given to you by your health care provider. Make sure you discuss any questions you have with your health care provider. Document Released: 09/27/2014 Document Revised: 04/30/2016 Document Reviewed: 04/30/2016 Elsevier Interactive Patient Education  2019  Elsevier Inc.   

## 2018-09-22 NOTE — Telephone Encounter (Signed)
Printed calendar and avs. °

## 2018-09-22 NOTE — Patient Instructions (Signed)
Thank you for choosing [facility] to provide your oncology and hematology care.  To afford each patient quality time with our providers, please arrive 30 minutes before your scheduled appointment time.  If you arrive late for your appointment, you may be asked to reschedule.  We strive to give you quality time with our providers, and arriving late affects you and other patients whose appointments are after yours.    If you are a no show for multiple scheduled visits, you may be dismissed from the clinic at the providers discretion.     Again, thank you for choosing [facility], our hope is that these requests will decrease the amount of time that you wait before being seen by our physicians.  ______________________________________________________________________   Should you have questions after your visit to the [facility], please contact our office at [phone] between the hours of 8:30 and 4:30 p.m.    Voicemails left after 4:30p.m will not be returned until the following business day.     For prescription refill requests, please have your pharmacy contact us directly.  Please also try to allow 48 hours for prescription requests.     Please contact the scheduling department for questions regarding scheduling.  For scheduling of procedures such as PET scans, CT scans, MRI, Ultrasound, etc please contact central scheduling at [phone].     Resources For Cancer Patients and Caregivers:    Oncolink.org:  A wonderful resource for patients and healthcare providers for information regarding your disease, ways to tract your treatment, what to expect, etc.      American Cancer Society:  800-227-2345  Can help patients locate various types of support and financial assistance   Cancer Care: 1-800-813-HOPE (4673) Provides financial assistance, online support groups, medication/co-pay assistance.     [place] DSS:  336-641-3447 Where to apply  for food stamps, Medicaid, and utility assistance   Medicare Rights Center: 800-333-4114 Helps people with Medicare understand their rights and benefits, navigate the Medicare system, and secure the quality healthcare they deserve   SCAT: 336-333-6589 [employer]'s shared-ride transportation service for eligible riders who have a disability that prevents them from riding the fixed route bus.     For additional information on assistance programs please contact our social worker:   Abigail Elmore:  [phone]  

## 2018-09-23 LAB — BPAM RBC
Blood Product Expiration Date: 202001242359
ISSUE DATE / TIME: 202001031219
Unit Type and Rh: 6200

## 2018-09-23 LAB — TYPE AND SCREEN
ABO/RH(D): A POS
Antibody Screen: NEGATIVE
UNIT DIVISION: 0

## 2018-09-25 ENCOUNTER — Encounter: Payer: Self-pay | Admitting: Family Medicine

## 2018-09-25 ENCOUNTER — Ambulatory Visit (INDEPENDENT_AMBULATORY_CARE_PROVIDER_SITE_OTHER): Payer: Medicare HMO | Admitting: Family Medicine

## 2018-09-25 VITALS — BP 118/80 | HR 61 | Temp 98.2°F | Wt 123.6 lb

## 2018-09-25 DIAGNOSIS — I5022 Chronic systolic (congestive) heart failure: Secondary | ICD-10-CM | POA: Diagnosis not present

## 2018-09-25 DIAGNOSIS — D500 Iron deficiency anemia secondary to blood loss (chronic): Secondary | ICD-10-CM | POA: Diagnosis not present

## 2018-09-25 NOTE — Progress Notes (Signed)
   Subjective:    Patient ID: Connie [name], female    DOB: 04/22/1934, 85 y.o.   MRN: [id number]  HPI She is here for a recheck.  She was not taking her Lasix prior to her last visit and is now back on it.  Her weight is now down 8 pounds.  She has had no chest pain, shortness of breath.  She is doing much better with that.  She was recently given a unit of blood.  She is scheduled to see Dr. Almyra Free tomorrow.   Review of Systems     Objective:   Physical Exam Alert and in no distress.  Cardiac exam shows a regular rhythm without murmurs or gallops.  Lower extremities show greatly diminished edema and now 0-1+ pitting is noted.       Assessment & Plan:  Chronic systolic CHF (congestive heart failure) ([name]) - Plan: Comprehensive metabolic panel She will continue on her present medication regimen.  Follow-up here in 4 months.

## 2018-09-26 LAB — COMPREHENSIVE METABOLIC PANEL
A/G RATIO: 1.6 (ref 1.2–2.2)
ALT: 6 IU/L (ref 0–32)
AST: 18 IU/L (ref 0–40)
Albumin: 3.6 g/dL (ref 3.5–4.7)
Alkaline Phosphatase: 126 IU/L — ABNORMAL HIGH (ref 39–117)
BUN/Creatinine Ratio: 26 (ref 12–28)
BUN: 22 mg/dL (ref 8–27)
Bilirubin Total: 0.5 mg/dL (ref 0.0–1.2)
CALCIUM: 7.8 mg/dL — AB (ref 8.7–10.3)
CO2: 26 mmol/L (ref 20–29)
Chloride: 103 mmol/L (ref 96–106)
Creatinine, Ser: 0.85 mg/dL (ref 0.57–1.00)
GFR calc Af Amer: 74 mL/min/{1.73_m2} (ref >59)
GFR, EST NON AFRICAN AMERICAN: 64 mL/min/{1.73_m2} (ref >59)
Globulin, Total: 2.3 g/dL (ref 1.5–4.5)
Glucose: 145 mg/dL — ABNORMAL HIGH (ref 65–99)
Potassium: 4.7 mmol/L (ref 3.5–5.2)
Sodium: 142 mmol/L (ref 134–144)
Total Protein: 5.9 g/dL — ABNORMAL LOW (ref 6.0–8.5)

## 2018-10-02 ENCOUNTER — Other Ambulatory Visit: Payer: Self-pay | Admitting: Cardiology

## 2018-10-02 MED ORDER — SACUBITRIL-VALSARTAN 24-26 MG PO TABS: 1.0000 | ORAL_TABLET | Freq: Two times a day (BID) | ORAL | 3 refills | Status: DC

## 2018-10-09 ENCOUNTER — Other Ambulatory Visit: Payer: Self-pay | Admitting: Family Medicine

## 2018-10-23 ENCOUNTER — Ambulatory Visit: Payer: Medicare HMO

## 2018-10-23 ENCOUNTER — Inpatient Hospital Stay: Payer: Medicare HMO | Attending: Hematology

## 2018-10-23 ENCOUNTER — Inpatient Hospital Stay: Payer: Medicare HMO

## 2018-10-23 ENCOUNTER — Other Ambulatory Visit: Payer: Self-pay | Admitting: Family Medicine

## 2018-10-23 ENCOUNTER — Other Ambulatory Visit: Payer: Medicare HMO

## 2018-10-23 VITALS — BP 152/45 | HR 67 | Temp 98.2°F | Resp 17

## 2018-10-23 DIAGNOSIS — D500 Iron deficiency anemia secondary to blood loss (chronic): Secondary | ICD-10-CM | POA: Diagnosis not present

## 2018-10-23 LAB — CBC WITH DIFFERENTIAL/PLATELET
Abs Immature Granulocytes: 0.02 10*3/uL (ref 0.00–0.07)
BASOS PCT: 1 %
Basophils Absolute: 0.1 10*3/uL (ref 0.0–0.1)
Eosinophils Absolute: 0.2 10*3/uL (ref 0.0–0.5)
Eosinophils Relative: 2 %
HCT: 28.4 % — ABNORMAL LOW (ref 36.0–46.0)
Hemoglobin: 8.7 g/dL — ABNORMAL LOW (ref 12.0–15.0)
Immature Granulocytes: 0 %
Lymphocytes Relative: 5 %
Lymphs Abs: 0.4 10*3/uL — ABNORMAL LOW (ref 0.7–4.0)
MCH: 31.1 pg (ref 26.0–34.0)
MCHC: 30.6 g/dL (ref 30.0–36.0)
MCV: 101.4 fL — ABNORMAL HIGH (ref 80.0–100.0)
MONO ABS: 0.7 10*3/uL (ref 0.1–1.0)
Monocytes Relative: 10 %
Neutro Abs: 6.4 10*3/uL (ref 1.7–7.7)
Neutrophils Relative %: 82 %
Platelets: 272 10*3/uL (ref 150–400)
RBC: 2.8 MIL/uL — ABNORMAL LOW (ref 3.87–5.11)
RDW: 15.5 % (ref 11.5–15.5)
WBC: 7.8 10*3/uL (ref 4.0–10.5)
nRBC: 0 % (ref 0.0–0.2)

## 2018-10-23 LAB — CMP (CANCER CENTER ONLY)
ALT: 6 U/L (ref 0–44)
AST: 20 U/L (ref 15–41)
Albumin: 3.7 g/dL (ref 3.5–5.0)
Alkaline Phosphatase: 189 U/L — ABNORMAL HIGH (ref 38–126)
Anion gap: 10 (ref 5–15)
BUN: 21 mg/dL (ref 8–23)
CO2: 26 mmol/L (ref 22–32)
Calcium: 8.8 mg/dL — ABNORMAL LOW (ref 8.9–10.3)
Chloride: 109 mmol/L (ref 98–111)
Creatinine: 1.03 mg/dL — ABNORMAL HIGH (ref 0.44–1.00)
GFR, EST AFRICAN AMERICAN: 58 mL/min — AB (ref >60)
GFR, Estimated: 50 mL/min — ABNORMAL LOW (ref >60)
Glucose, Bld: 189 mg/dL — ABNORMAL HIGH (ref 70–99)
Potassium: 3.9 mmol/L (ref 3.5–5.1)
Sodium: 145 mmol/L (ref 135–145)
Total Bilirubin: 0.6 mg/dL (ref 0.3–1.2)
Total Protein: 6.7 g/dL (ref 6.5–8.1)

## 2018-10-23 LAB — FERRITIN: Ferritin: 1164 ng/mL — ABNORMAL HIGH (ref 11–307)

## 2018-10-23 LAB — IRON AND TIBC
Iron: 31 ug/dL — ABNORMAL LOW (ref 41–142)
Saturation Ratios: 15 % — ABNORMAL LOW (ref 21–57)
TIBC: 201 ug/dL — ABNORMAL LOW (ref 236–444)
UIBC: 170 ug/dL (ref 120–384)

## 2018-10-23 MED ORDER — SODIUM CHLORIDE 0.9 % IV SOLN
INTRAVENOUS | Status: DC
  Administered 2018-10-23: 09:00:00 via INTRAVENOUS
  Filled 2018-10-23: qty 250

## 2018-10-23 MED ORDER — SODIUM CHLORIDE 0.9 % IV SOLN
750.0000 mg | Freq: Once | INTRAVENOUS | Status: AC
  Administered 2018-10-23: 750 mg via INTRAVENOUS
  Filled 2018-10-23: qty 15

## 2018-10-23 NOTE — Progress Notes (Signed)
Pt declined to stay for post iron observation period. Vitals stable and pt ambulated out of clinic without incident.   Vitals:   10/23/18 0834 10/23/18 0930  BP: (!) 156/51 (!) 152/45  Pulse: 63 67  Resp: 17 17  Temp: 98 F (36.7 C) 98.2 F (36.8 C)  TempSrc: Oral Oral  SpO2: 100% 100%

## 2018-10-23 NOTE — Patient Instructions (Signed)

## 2018-10-25 ENCOUNTER — Telehealth: Payer: Self-pay

## 2018-10-25 NOTE — Telephone Encounter (Signed)
Son called about prolia shot due date because pt is in more pain. Pt son was advised she made need an appt. Son says he will keep an eye her and call if she does not improve with tylenol currently on. [place]

## 2018-11-01 ENCOUNTER — Other Ambulatory Visit: Payer: Self-pay | Admitting: Family Medicine

## 2018-11-01 DIAGNOSIS — E785 Hyperlipidemia, unspecified: Principal | ICD-10-CM

## 2018-11-01 DIAGNOSIS — E1169 Type 2 diabetes mellitus with other specified complication: Secondary | ICD-10-CM

## 2018-11-17 NOTE — Progress Notes (Signed)
Marland Kitchen    HEMATOLOGY/ONCOLOGY CLINIC NOTE  Date of Service: 11/21/18    Patient Care Team: [name], MD as PCP - General (Family Medicine)  CHIEF COMPLAINTS/PURPOSE OF CONSULTATION:  Iron deficiency anemia due to GI bleeding from multiple AVMs  DIAGNOSIS: Iron deficiency anemia likely related to ongoing chronic GI bleeding from multiple AVMs  TREATMENT -IV iron replacement with injectafer as needed (tolerated well). (Had some shortness of breath with IV Feraheme - which was likely fluid overload as opposed to a true allergic reaction but patient has been hesitant to take this)  HISTORY OF PRESENTING ILLNESS:  Please see previous note for details  INTERVAL HISTORY  Connie [name] is here for followup for her IDA. The patient's last visit with [place] was on 09/22/18. She is accompanied today by her son. The pt reports that she is doing well overall.   The pt reports that she has had "a little low" energy levels. She notes that her stools are solid and black and is not taking Pepto bismol nor PO iron replacement at this time. She notes that "I suspect it's all the time, but I don't know." She also notes that she doesn't see very well.  The pt denies CP, SOB, or dizziness.  Lab results today (11/21/18) of CBC w/diff is as follows: all values are WNL except for RBC at 2.69, HGB at 8.6, HCT at 28.4, MCV at 105.6, Lymphs abs at 300. 11/21/18 CMP is pending 11/21/18 Ferritin is pending 11/21/18 Iron and TIBC is pending  On review of systems, pt reports lower energy levels, solid black stools, and denies CP, SOB, light headedness, dizziness, abdominal pains, and any other symptoms.   MEDICAL HISTORY:  Past Medical History:  Diagnosis Date  . Allergy    RHINITIS  . Angiodysplasia of duodenum    hx/notes 11/21/2015  . Angiodysplasia of stomach    hx/notes 11/21/2015  . Arthritis    "joints ache"  . Bleeding gastric ulcer    hx/notes 11/21/2015  . Chronic blood loss anemia    secondary to  angiodysplasia of the stomach and duodenum as well as history of bleeding gastric ulcer hx/notes 11/21/2015  . Chronic systolic CHF (congestive heart failure) ([place]) 12/23/2015  . History of blood transfusion 01/2015; 06/2015; 11/21/2015  . Hypercholesteremia   . Hypertension   . Hypothyroidism   . Obesity   . Pneumonia "several times"  . Type II diabetes mellitus ([place])     SURGICAL HISTORY: Past Surgical History:  Procedure Laterality Date  . BIOPSY  07/24/2018   Procedure: BIOPSY;  Surgeon: Carol Ada, MD;  Location: [facility] ENDOSCOPY;  Service: Endoscopy;;  . CARDIAC CATHETERIZATION N/A 12/19/2015   Procedure: Left Heart Cath and Coronary Angiography;  Surgeon: Leonie Man, MD;  Location: [place] [facility];  Service: Cardiovascular;  Laterality: N/A;  . COLONOSCOPY N/A 08/30/2014   Procedure: COLONOSCOPY;  Surgeon: Beryle Beams, MD;  Location: [facility] ENDOSCOPY;  Service: Endoscopy;  Laterality: N/A;  . COLONOSCOPY N/A 02/21/2015   Procedure: COLONOSCOPY;  Surgeon: Carol Ada, MD;  Location: [facility]COPY;  Service: Endoscopy;  Laterality: N/A;  . COLONOSCOPY WITH PROPOFOL N/A 07/24/2018   Procedure: COLONOSCOPY WITH PROPOFOL;  Surgeon: Carol Ada, MD;  Location: [facility] ENDOSCOPY;  Service: Endoscopy;  Laterality: N/A;  . ENTEROSCOPY N/A 06/06/2015   Procedure: ENTEROSCOPY;  Surgeon: Carol Ada, MD;  Location: [facility] ENDOSCOPY;  Service: Endoscopy;  Laterality: N/A;  . ENTEROSCOPY N/A 07/24/2018   Procedure: ENTEROSCOPY;  Surgeon: Carol Ada,  MD;  Location: [facility] ENDOSCOPY;  Service: Endoscopy;  Laterality: N/A;  . FRACTURE SURGERY    . GIVENS CAPSULE STUDY N/A 05/15/2015   Procedure: GIVENS CAPSULE STUDY;  Surgeon: Carol Ada, MD;  Location: [facility] ENDOSCOPY;  Service: Endoscopy;  Laterality: N/A;  . HOT HEMOSTASIS N/A 08/30/2014   Procedure: HOT HEMOSTASIS (ARGON PLASMA COAGULATION/BICAP);  Surgeon: Beryle Beams, MD;  Location: [facility];  Service: Endoscopy;  Laterality: N/A;  . HOT  HEMOSTASIS N/A 06/06/2015   Procedure: HOT HEMOSTASIS (ARGON PLASMA COAGULATION/BICAP);  Surgeon: Carol Ada, MD;  Location: [facility] ENDOSCOPY;  Service: Endoscopy;  Laterality: N/A;  . HOT HEMOSTASIS N/A 07/24/2018   Procedure: HOT HEMOSTASIS (ARGON PLASMA COAGULATION/BICAP);  Surgeon: Carol Ada, MD;  Location: [facility] ENDOS[facility];  Service: Endoscopy;  Laterality: N/A;  . JOINT REPLACEMENT    . REVISION TOTAL KNEE ARTHROPLASTY Bilateral 2004-2015  . SHOULDER OPEN ROTATOR CUFF REPAIR Right 12/2004   open subacromial decompression, distal clavicle  resection, rotator cuff repair/notes 02/02/2011  . TONSILLECTOMY  1940s  . TOTAL KNEE ARTHROPLASTY Bilateral ~ 2002  . TOTAL THYROIDECTOMY  ~ 1966    SOCIAL HISTORY: Social History   Socioeconomic History  . Marital status: Married    Spouse name: Not on file  . Number of children: Not on file  . Years of education: Not on file  . Highest education level: Not on file  Occupational History  . Not on file  Social Needs  . Financial resource strain: Not on file  . Food insecurity:    Worry: Not on file    Inability: Not on file  . Transportation needs:    Medical: Not on file    Non-medical: Not on file  Tobacco Use  . Smoking status: Never Smoker  . Smokeless tobacco: Never Used  Substance and Sexual Activity  . Alcohol use: No  . Drug use: No  . Sexual activity: Not Currently  Lifestyle  . Physical activity:    Days per week: Not on file    Minutes per session: Not on file  . Stress: Not on file  Relationships  . Social connections:    Talks on phone: Not on file    Gets together: Not on file    Attends religious service: Not on file    Active member of club or organization: Not on file    Attends meetings of clubs or organizations: Not on file    Relationship status: Not on file  . Intimate partner violence:    Fear of current or ex partner: Not on file    Emotionally abused: Not on file    Physically abused: Not on file     Forced sexual activity: Not on file  Other Topics Concern  . Not on file  Social History Narrative  . Not on file    FAMILY HISTORY: Family History  Problem Relation Age of Onset  . Asthma Mother   . Ulcers Father   . Anemia Sister     ALLERGIES:  has No Known Allergies.  MEDICATIONS:  Current Outpatient Medications  Medication Sig Dispense Refill  . ACCU-CHEK FASTCLIX LANCETS MISC 1 each by Does not apply route 2 (two) times daily. DX E11.9 204 each 3  . acetaminophen (TYLENOL) 500 MG tablet Take 500 mg by mouth every 6 (six) hours as needed for mild pain or headache.     Marland Kitchen amLODipine (NORVASC) 10 MG tablet TAKE 1 TABLET BY MOUTH DAILY. 90 tablet 1  . atorvastatin (LIPITOR)  40 MG tablet TAKE 1 TABLET EVERY DAY 90 tablet 0  . Blood Glucose Monitoring Suppl (ACCU-CHEK GUIDE) w/Device KIT 1 each by Does not apply route 2 (two) times daily. DX E11.9 1 kit 0  . Calcium Carb-Cholecalciferol (CALCIUM 1000 + D PO) Take 1 tablet by mouth.    . carvedilol (COREG) 12.5 MG tablet TAKE 1 TABLET (12.5 MG TOTAL) BY MOUTH 2 (TWO) TIMES DAILY WITH A MEAL. 180 tablet 1  . furosemide (LASIX) 40 MG tablet TAKE 1 TABLET EVERY DAY 90 tablet 0  . glucose blood (ACCU-CHEK GUIDE) test strip Patient to test 2 times daily DX E11.9 Use as instructed 200 each 3  . Lancets Misc. (ACCU-CHEK FASTCLIX LANCET) KIT 1 each by Does not apply route 2 (two) times daily. DX E11.9 1 kit 0  . levothyroxine (SYNTHROID, LEVOTHROID) 125 MCG tablet TAKE 1 TABLET EVERY DAY 90 tablet 3  . Multiple Vitamins-Minerals (WOMENS MULTIVITAMIN PLUS PO) Take 1 tablet by mouth daily.    . pantoprazole (PROTONIX) 20 MG tablet TAKE 1 TABLET (20 MG TOTAL) BY MOUTH DAILY. 90 tablet 3  . pioglitazone (ACTOS) 30 MG tablet TAKE 1 TABLET EVERY DAY 90 tablet 0  . potassium chloride (K-DUR,KLOR-CON) 10 MEQ tablet TAKE 1 TABLET TWICE DAILY 180 tablet 1  . sacubitril-valsartan (ENTRESTO) 24-26 MG Take 1 tablet by mouth 2 (two) times daily. 180  tablet 3   No current facility-administered medications for this visit.     REVIEW OF SYSTEMS:    A 10+ POINT REVIEW OF SYSTEMS WAS OBTAINED including neurology, dermatology, psychiatry, cardiac, respiratory, lymph, extremities, GI, GU, Musculoskeletal, constitutional, breasts, reproductive, HEENT.  All pertinent positives are noted in the HPI.  All others are negative.   PHYSICAL EXAMINATION:  ECOG PERFORMANCE STATUS: 1 - Symptomatic but completely ambulatory  Vitals:   11/21/18 0833  BP: (!) 144/79  Pulse: 65  Resp: 18  Temp: 98.5 F (36.9 C)  SpO2: 100%   Filed Weights   11/21/18 0833  Weight: 124 lb 4.8 oz (56.4 kg)   .Body mass index is 27.87 kg/m.  GENERAL:alert, in no acute distress and comfortable SKIN: no acute rashes, no significant lesions EYES: conjunctiva are pink and non-injected, sclera anicteric OROPHARYNX: MMM, no exudates, no oropharyngeal erythema or ulceration NECK: supple, no JVD LYMPH:  no palpable lymphadenopathy in the cervical, axillary or inguinal regions LUNGS: clear to auscultation b/l with normal respiratory effort HEART: regular rate & rhythm ABDOMEN:  normoactive bowel sounds , non tender, not distended. No palpable hepatosplenomegaly.  Extremity: 1+ left pedal edema, trace right pedal edema PSYCH: alert & oriented x 3 with fluent speech NEURO: no focal motor/sensory deficits   LABORATORY DATA:    CBC Latest Ref Rng & Units 11/21/2018 10/23/2018 09/22/2018  WBC 4.0 - 10.5 K/uL 6.9 7.8 8.7  Hemoglobin 12.0 - 15.0 g/dL 8.6(L) 8.7(L) 7.7(L)  Hematocrit 36.0 - 46.0 % 28.4(L) 28.4(L) 26.3(L)  Platelets 150 - 400 K/uL 236 272 303    . CBC    Component Value Date/Time   WBC 6.9 11/21/2018 0815   RBC 2.69 (L) 11/21/2018 0815   HGB 8.6 (L) 11/21/2018 0815   HGB 8.9 (L) 08/01/2018 1217   HGB 10.8 (L) 06/20/2017 1326   HCT 28.4 (L) 11/21/2018 0815   HCT 27.3 (L) 08/01/2018 1217   HCT 34.9 06/20/2017 1326   PLT 236 11/21/2018 0815   PLT 307  08/01/2018 1217   MCV 105.6 (H) 11/21/2018 0815   MCV 92 08/01/2018  1217   MCV 94.8 06/20/2017 1326   MCH 32.0 11/21/2018 0815   MCHC 30.3 11/21/2018 0815   RDW 15.3 11/21/2018 0815   RDW 14.7 08/01/2018 1217   RDW 14.2 06/20/2017 1326   LYMPHSABS 0.3 (L) 11/21/2018 0815   LYMPHSABS 0.5 (L) 08/01/2018 1217   LYMPHSABS 0.6 (L) 06/20/2017 1326   MONOABS 0.6 11/21/2018 0815   MONOABS 0.7 06/20/2017 1326   EOSABS 0.2 11/21/2018 0815   EOSABS 0.2 08/01/2018 1217   BASOSABS 0.0 11/21/2018 0815   BASOSABS 0.1 08/01/2018 1217   BASOSABS 0.0 06/20/2017 1326    . CMP Latest Ref Rng & Units 11/21/2018 10/23/2018 09/25/2018  Glucose 70 - 99 mg/dL 189(H) 189(H) 145(H)  BUN 8 - 23 mg/dL 28(H) 21 22  Creatinine 0.44 - 1.00 mg/dL 1.12(H) 1.03(H) 0.85  Sodium 135 - 145 mmol/L 143 145 142  Potassium 3.5 - 5.1 mmol/L 4.2 3.9 4.7  Chloride 98 - 111 mmol/L 109 109 103  CO2 22 - 32 mmol/L '25 26 26  ' Calcium 8.9 - 10.3 mg/dL 8.5(L) 8.8(L) 7.8(L)  Total Protein 6.5 - 8.1 g/dL 6.4(L) 6.7 5.9(L)  Total Bilirubin 0.3 - 1.2 mg/dL 0.4 0.6 0.5  Alkaline Phos 38 - 126 U/L 220(H) 189(H) 126(H)  AST 15 - 41 U/L '18 20 18  ' ALT 0 - 44 U/L <'6 6 6   ' . Lab Results  Component Value Date   IRON 31 (L) 10/23/2018   TIBC 201 (L) 10/23/2018   IRONPCTSAT 15 (L) 10/23/2018   (Iron and TIBC)  Lab Results  Component Value Date   FERRITIN 1,164 (H) 10/23/2018       ASSESSMENT & PLAN:   85 y.o. Caucasian female with  #1 Microcytic anemia likely related to iron deficiency from chronic GI bleeding. No known disorder causing chronic inflammation at this time.  #2 history of recurrent GI bleeding related to upper and lower GI tract AVMs and gastric erosions. Has had several admissions with requirement of multiple PRBC transfusions. Dropping ferritin suggests ongoing slow GI losses. No clinically overt GI bleeding noted.  #3 Hypothyroidism on levothyroxine .  #4 ?? Reaction to [facility] - current hospital  admission that shortness of breath appears to be more likely related to CHF decompensation than allergic reaction to Feraheme. However patient is very anxious and hesitant to get this preparation. Switched to and tolerated Injectafer  PLAN:  -Discussed pt labwork today, 11/21/18; HGB holding at 8.6 -11/21/18 Ferritin is pending. Last available Ferritin from 10/23/18 was at 1164 -Saturation still less than 20%, will proceed with IV Injectafer today 11/21/18 -Continue Vitamin B complex -Ongoing GI blood loss -Recommend discussing black stools with GI -Continue follow up with Dr. Benson Norway in GI to address source of blood loss -Absolutely no NSAIDs, only Tylenol for mild pain   -Hold PO iron as this makes it difficulty to differentiate the cause of her dark stool color, and we are replacing Iron IV -Last colonoscopy from 07/24/18 revealed two non-bleeding angiodysplastic lesions which were treated with a monopolar probe  -Last small bowel endoscopy from 07/24/18 revealed gastritis with hemorrhage, negative for H.pylori -Will continue to watch labs once a month and will hold IV Injectafer next month -Will see the pt back in 2 months   Labs in 1 months (may discontinue Iron infusion appointment in 1 month and schedule 1 unit of PRBC transfusion if needed) Labs, IV Iron and MD visit in 2 months   All of the patients questions were answered with  apparent satisfaction. The patient knows to call the clinic with any problems, questions or concerns.  The total time spent in the appt was 20 minutes and more than 50% was on counseling and direct patient cares.    Sullivan Lone MD [place] AAHIVMS [facility] Hematology/Oncology Physician [facility]  (Office):       [phone] (Work cell):  [phone] (Fax):           [phone]  I, Baldwin [place], am acting as a scribe for Dr. Sullivan Lone.   .I have reviewed the above documentation for accuracy and completeness, and I agree with the above. Brunetta Genera MD

## 2018-11-21 ENCOUNTER — Inpatient Hospital Stay (HOSPITAL_BASED_OUTPATIENT_CLINIC_OR_DEPARTMENT_OTHER): Payer: Medicare HMO | Admitting: Hematology

## 2018-11-21 ENCOUNTER — Telehealth: Payer: Self-pay | Admitting: Hematology

## 2018-11-21 ENCOUNTER — Inpatient Hospital Stay: Payer: Medicare HMO

## 2018-11-21 ENCOUNTER — Inpatient Hospital Stay: Payer: Medicare HMO | Attending: Hematology

## 2018-11-21 VITALS — BP 144/79 | HR 65 | Temp 98.5°F | Resp 18 | Ht <= 58 in | Wt 124.3 lb

## 2018-11-21 VITALS — BP 144/52 | HR 63 | Temp 98.6°F | Resp 16

## 2018-11-21 DIAGNOSIS — E669 Obesity, unspecified: Secondary | ICD-10-CM | POA: Insufficient documentation

## 2018-11-21 DIAGNOSIS — D649 Anemia, unspecified: Secondary | ICD-10-CM

## 2018-11-21 DIAGNOSIS — D500 Iron deficiency anemia secondary to blood loss (chronic): Secondary | ICD-10-CM

## 2018-11-21 DIAGNOSIS — Z79899 Other long term (current) drug therapy: Secondary | ICD-10-CM | POA: Insufficient documentation

## 2018-11-21 DIAGNOSIS — E039 Hypothyroidism, unspecified: Secondary | ICD-10-CM

## 2018-11-21 DIAGNOSIS — K922 Gastrointestinal hemorrhage, unspecified: Secondary | ICD-10-CM | POA: Diagnosis not present

## 2018-11-21 DIAGNOSIS — E119 Type 2 diabetes mellitus without complications: Secondary | ICD-10-CM | POA: Diagnosis not present

## 2018-11-21 DIAGNOSIS — I110 Hypertensive heart disease with heart failure: Secondary | ICD-10-CM | POA: Diagnosis not present

## 2018-11-21 DIAGNOSIS — I5022 Chronic systolic (congestive) heart failure: Secondary | ICD-10-CM | POA: Diagnosis not present

## 2018-11-21 LAB — CMP (CANCER CENTER ONLY)
ALBUMIN: 3.5 g/dL (ref 3.5–5.0)
ALT: <6 U/L (ref 0–44)
AST: 18 U/L (ref 15–41)
Alkaline Phosphatase: 220 U/L — ABNORMAL HIGH (ref 38–126)
Anion gap: 9 (ref 5–15)
BUN: 28 mg/dL — ABNORMAL HIGH (ref 8–23)
CO2: 25 mmol/L (ref 22–32)
Calcium: 8.5 mg/dL — ABNORMAL LOW (ref 8.9–10.3)
Chloride: 109 mmol/L (ref 98–111)
Creatinine: 1.12 mg/dL — ABNORMAL HIGH (ref 0.44–1.00)
GFR, Est AFR Am: 53 mL/min — ABNORMAL LOW (ref >60)
GFR, Estimated: 45 mL/min — ABNORMAL LOW (ref >60)
Glucose, Bld: 189 mg/dL — ABNORMAL HIGH (ref 70–99)
Potassium: 4.2 mmol/L (ref 3.5–5.1)
Sodium: 143 mmol/L (ref 135–145)
Total Bilirubin: 0.4 mg/dL (ref 0.3–1.2)
Total Protein: 6.4 g/dL — ABNORMAL LOW (ref 6.5–8.1)

## 2018-11-21 LAB — CBC WITH DIFFERENTIAL/PLATELET
Abs Immature Granulocytes: 0.03 10*3/uL (ref 0.00–0.07)
BASOS ABS: 0 10*3/uL (ref 0.0–0.1)
BASOS PCT: 0 %
Eosinophils Absolute: 0.2 10*3/uL (ref 0.0–0.5)
Eosinophils Relative: 3 %
HCT: 28.4 % — ABNORMAL LOW (ref 36.0–46.0)
Hemoglobin: 8.6 g/dL — ABNORMAL LOW (ref 12.0–15.0)
Immature Granulocytes: 0 %
Lymphocytes Relative: 5 %
Lymphs Abs: 0.3 10*3/uL — ABNORMAL LOW (ref 0.7–4.0)
MCH: 32 pg (ref 26.0–34.0)
MCHC: 30.3 g/dL (ref 30.0–36.0)
MCV: 105.6 fL — ABNORMAL HIGH (ref 80.0–100.0)
Monocytes Absolute: 0.6 10*3/uL (ref 0.1–1.0)
Monocytes Relative: 8 %
Neutro Abs: 5.8 10*3/uL (ref 1.7–7.7)
Neutrophils Relative %: 84 %
Platelets: 236 10*3/uL (ref 150–400)
RBC: 2.69 MIL/uL — ABNORMAL LOW (ref 3.87–5.11)
RDW: 15.3 % (ref 11.5–15.5)
WBC: 6.9 10*3/uL (ref 4.0–10.5)
nRBC: 0 % (ref 0.0–0.2)

## 2018-11-21 LAB — IRON AND TIBC
Iron: 46 ug/dL (ref 41–142)
SATURATION RATIOS: 23 % (ref 21–57)
TIBC: 205 ug/dL — ABNORMAL LOW (ref 236–444)
UIBC: 159 ug/dL (ref 120–384)

## 2018-11-21 LAB — FERRITIN: Ferritin: 1399 ng/mL — ABNORMAL HIGH (ref 11–307)

## 2018-11-21 MED ORDER — SODIUM CHLORIDE 0.9 % IV SOLN
750.0000 mg | Freq: Once | INTRAVENOUS | Status: AC
  Administered 2018-11-21: 750 mg via INTRAVENOUS
  Filled 2018-11-21: qty 15

## 2018-11-21 MED ORDER — SODIUM CHLORIDE 0.9 % IV SOLN
INTRAVENOUS | Status: DC
  Administered 2018-11-21: 10:00:00 via INTRAVENOUS
  Filled 2018-11-21: qty 250

## 2018-11-21 NOTE — Patient Instructions (Signed)

## 2018-11-21 NOTE — Telephone Encounter (Signed)
Gave patient avs report and appointments for March and April. Existing May appointment will be address at April f/u. Iron infusion for early April (36month) cxd per 3/3 los.

## 2018-12-09 ENCOUNTER — Other Ambulatory Visit: Payer: Self-pay | Admitting: Family Medicine

## 2018-12-13 ENCOUNTER — Other Ambulatory Visit (INDEPENDENT_AMBULATORY_CARE_PROVIDER_SITE_OTHER): Payer: Medicare HMO

## 2018-12-13 ENCOUNTER — Other Ambulatory Visit: Payer: Self-pay

## 2018-12-13 DIAGNOSIS — M810 Age-related osteoporosis without current pathological fracture: Secondary | ICD-10-CM | POA: Diagnosis not present

## 2018-12-13 MED ORDER — DENOSUMAB 60 MG/ML ~~LOC~~ SOSY
60.0000 mg | PREFILLED_SYRINGE | Freq: Once | SUBCUTANEOUS | Status: AC
  Administered 2018-12-13: 60 mg via SUBCUTANEOUS

## 2018-12-16 ENCOUNTER — Other Ambulatory Visit: Payer: Self-pay | Admitting: Family Medicine

## 2018-12-18 ENCOUNTER — Other Ambulatory Visit: Payer: Self-pay | Admitting: Cardiology

## 2018-12-19 ENCOUNTER — Inpatient Hospital Stay: Payer: Medicare HMO

## 2018-12-19 ENCOUNTER — Other Ambulatory Visit: Payer: Self-pay

## 2018-12-19 DIAGNOSIS — I110 Hypertensive heart disease with heart failure: Secondary | ICD-10-CM | POA: Diagnosis not present

## 2018-12-19 DIAGNOSIS — D500 Iron deficiency anemia secondary to blood loss (chronic): Secondary | ICD-10-CM | POA: Diagnosis not present

## 2018-12-19 DIAGNOSIS — E669 Obesity, unspecified: Secondary | ICD-10-CM | POA: Diagnosis not present

## 2018-12-19 DIAGNOSIS — E039 Hypothyroidism, unspecified: Secondary | ICD-10-CM | POA: Diagnosis not present

## 2018-12-19 DIAGNOSIS — K922 Gastrointestinal hemorrhage, unspecified: Secondary | ICD-10-CM | POA: Diagnosis not present

## 2018-12-19 DIAGNOSIS — E119 Type 2 diabetes mellitus without complications: Secondary | ICD-10-CM | POA: Diagnosis not present

## 2018-12-19 DIAGNOSIS — Z79899 Other long term (current) drug therapy: Secondary | ICD-10-CM | POA: Diagnosis not present

## 2018-12-19 DIAGNOSIS — I5022 Chronic systolic (congestive) heart failure: Secondary | ICD-10-CM | POA: Diagnosis not present

## 2018-12-19 LAB — CMP (CANCER CENTER ONLY)
ALT: 7 U/L (ref 0–44)
AST: 20 U/L (ref 15–41)
Albumin: 3.6 g/dL (ref 3.5–5.0)
Alkaline Phosphatase: 195 U/L — ABNORMAL HIGH (ref 38–126)
Anion gap: 9 (ref 5–15)
BUN: 14 mg/dL (ref 8–23)
CO2: 23 mmol/L (ref 22–32)
Calcium: 6.8 mg/dL — ABNORMAL LOW (ref 8.9–10.3)
Chloride: 110 mmol/L (ref 98–111)
Creatinine: 0.94 mg/dL (ref 0.44–1.00)
GFR, Estimated: 56 mL/min — ABNORMAL LOW (ref >60)
Glucose, Bld: 119 mg/dL — ABNORMAL HIGH (ref 70–99)
Potassium: 4.5 mmol/L (ref 3.5–5.1)
Sodium: 142 mmol/L (ref 135–145)
TOTAL PROTEIN: 6.9 g/dL (ref 6.5–8.1)
Total Bilirubin: 0.4 mg/dL (ref 0.3–1.2)

## 2018-12-19 LAB — CBC WITH DIFFERENTIAL/PLATELET
Abs Immature Granulocytes: 0.08 10*3/uL — ABNORMAL HIGH (ref 0.00–0.07)
Basophils Absolute: 0 10*3/uL (ref 0.0–0.1)
Basophils Relative: 0 %
EOS ABS: 0.2 10*3/uL (ref 0.0–0.5)
EOS PCT: 3 %
HCT: 30 % — ABNORMAL LOW (ref 36.0–46.0)
Hemoglobin: 9 g/dL — ABNORMAL LOW (ref 12.0–15.0)
Immature Granulocytes: 1 %
Lymphocytes Relative: 8 %
Lymphs Abs: 0.6 10*3/uL — ABNORMAL LOW (ref 0.7–4.0)
MCH: 32.3 pg (ref 26.0–34.0)
MCHC: 30 g/dL (ref 30.0–36.0)
MCV: 107.5 fL — ABNORMAL HIGH (ref 80.0–100.0)
Monocytes Absolute: 0.6 10*3/uL (ref 0.1–1.0)
Monocytes Relative: 9 %
Neutro Abs: 5.5 10*3/uL (ref 1.7–7.7)
Neutrophils Relative %: 79 %
Platelets: 293 10*3/uL (ref 150–400)
RBC: 2.79 MIL/uL — ABNORMAL LOW (ref 3.87–5.11)
RDW: 14.4 % (ref 11.5–15.5)
WBC: 7 10*3/uL (ref 4.0–10.5)
nRBC: 0 % (ref 0.0–0.2)

## 2018-12-19 LAB — FERRITIN: Ferritin: 1925 ng/mL — ABNORMAL HIGH (ref 11–307)

## 2018-12-19 LAB — SAMPLE TO BLOOD BANK

## 2018-12-22 ENCOUNTER — Other Ambulatory Visit: Payer: Medicare HMO

## 2018-12-22 ENCOUNTER — Ambulatory Visit: Payer: Medicare HMO

## 2018-12-25 ENCOUNTER — Ambulatory Visit: Payer: Medicare HMO | Admitting: Family Medicine

## 2018-12-29 ENCOUNTER — Other Ambulatory Visit: Payer: Self-pay | Admitting: Family Medicine

## 2019-01-05 ENCOUNTER — Telehealth: Payer: Self-pay

## 2019-01-05 ENCOUNTER — Other Ambulatory Visit: Payer: Self-pay | Admitting: Family Medicine

## 2019-01-05 DIAGNOSIS — E785 Hyperlipidemia, unspecified: Principal | ICD-10-CM

## 2019-01-05 DIAGNOSIS — E1169 Type 2 diabetes mellitus with other specified complication: Secondary | ICD-10-CM

## 2019-01-05 NOTE — Telephone Encounter (Signed)
**Note De-Identified  Obfuscation** I have done an [place] PA through covermymeds. Key: [id number]   I did receive an immediate approval response from covermymeds as follows: Your request has been approved  PA Case: [id number], Status: Approved, Coverage Starts on: 09/20/2018 12:00:00 AM, Coverage Ends on: 09/20/2019 12:00:00 AM.   I have notified the pts pharmacy of this approval.

## 2019-01-15 ENCOUNTER — Telehealth: Payer: Self-pay | Admitting: *Deleted

## 2019-01-15 NOTE — Progress Notes (Signed)
Marland Kitchen    HEMATOLOGY/ONCOLOGY CLINIC NOTE  Date of Service: 01/16/19    Patient Care Team: [name], MD as PCP - General (Family Medicine)  CHIEF COMPLAINTS/PURPOSE OF CONSULTATION:  Iron deficiency anemia due to GI bleeding from multiple AVMs  DIAGNOSIS: Iron deficiency anemia likely related to ongoing chronic GI bleeding from multiple AVMs  TREATMENT -IV iron replacement with injectafer as needed (tolerated well). (Had some shortness of breath with IV Feraheme - which was likely fluid overload as opposed to a true allergic reaction but patient has been hesitant to take this)  HISTORY OF PRESENTING ILLNESS:  Please see previous note for details  INTERVAL HISTORY  Connie [name] is here for followup for her IDA. The patient's last visit with [place] was on 11/21/18. The pt reports that she is doing well overall.  The pt reports that she has had fair to good energy levels and denies noticing any overt blood in the stools or black stools. She notes She denies any abdominal pains but endorses stable ankle swelling.  Lab results today (01/16/19) of CBC w/diff is as follows: all values are WNL except for RBC at 2.63, HGB at 8.4, HCT at 27.7, MCV at 105.3, Lymphs abs at 400. 01/16/19 Ferritin is pending  On review of systems, pt reports fair energy levels, eating well, stable ankle swelling, and denies overt blood in the stools, black stools, abdominal pains, and any other symptoms.    MEDICAL HISTORY:  Past Medical History:  Diagnosis Date  . Allergy    RHINITIS  . Angiodysplasia of duodenum    hx/notes 11/21/2015  . Angiodysplasia of stomach    hx/notes 11/21/2015  . Arthritis    "joints ache"  . Bleeding gastric ulcer    hx/notes 11/21/2015  . Chronic blood loss anemia    secondary to angiodysplasia of the stomach and duodenum as well as history of bleeding gastric ulcer hx/notes 11/21/2015  . Chronic systolic CHF (congestive heart failure) ([place]) 12/23/2015  . History of blood  transfusion 01/2015; 06/2015; 11/21/2015  . Hypercholesteremia   . Hypertension   . Hypothyroidism   . Obesity   . Pneumonia "several times"  . Type II diabetes mellitus ([place])     SURGICAL HISTORY: Past Surgical History:  Procedure Laterality Date  . BIOPSY  07/24/2018   Procedure: BIOPSY;  Surgeon: Carol Ada, MD;  Location: [facility];  Service: Endoscopy;;  . CARDIAC CATHETERIZATION N/A 12/19/2015   Procedure: Left Heart Cath and Coronary Angiography;  Surgeon: Leonie Man, MD;  Location: [facility];  Service: Cardiovascular;  Laterality: N/A;  . COLONOSCOPY N/A 08/30/2014   Procedure: COLONOSCOPY;  Surgeon: Beryle Beams, MD;  Location: [facility];  Service: Endoscopy;  Laterality: N/A;  . COLONOSCOPY N/A 02/21/2015   Procedure: COLONOSCOPY;  Surgeon: Carol Ada, MD;  Location: [facility];  Service: Endoscopy;  Laterality: N/A;  . COLONOSCOPY WITH PROPOFOL N/A 07/24/2018   Procedure: COLONOSCOPY WITH PROPOFOL;  Surgeon: Carol Ada, MD;  Location: [facility];  Service: Endoscopy;  Laterality: N/A;  . ENTEROSCOPY N/A 06/06/2015   Procedure: ENTEROSCOPY;  Surgeon: Carol Ada, MD;  Location: [facility];  Service: Endoscopy;  Laterality: N/A;  . ENTEROSCOPY N/A 07/24/2018   Procedure: ENTEROSCOPY;  Surgeon: Carol Ada, MD;  Location: [facility];  Service: Endoscopy;  Laterality: N/A;  . FRACTURE SURGERY    . GIVENS CAPSULE STUDY N/A 05/15/2015   Procedure: GIVENS CAPSULE STUDY;  Surgeon: Carol Ada, MD;  Location: [place];  Service:  Endoscopy;  Laterality: N/A;  . HOT HEMOSTASIS N/A 08/30/2014   Procedure: HOT HEMOSTASIS (ARGON PLASMA COAGULATION/BICAP);  Surgeon: Beryle Beams, MD;  Location: [facility];  Service: Endoscopy;  Laterality: N/A;  . HOT HEMOSTASIS N/A 06/06/2015   Procedure: HOT HEMOSTASIS (ARGON PLASMA COAGULATION/BICAP);  Surgeon: Carol Ada, MD;  Location: [facility];  Service: Endoscopy;  Laterality: N/A;  . HOT HEMOSTASIS N/A  07/24/2018   Procedure: HOT HEMOSTASIS (ARGON PLASMA COAGULATION/BICAP);  Surgeon: Carol Ada, MD;  Location: [facility];  Service: Endoscopy;  Laterality: N/A;  . JOINT REPLACEMENT    . REVISION TOTAL KNEE ARTHROPLASTY Bilateral 2004-2015  . SHOULDER OPEN ROTATOR CUFF REPAIR Right 12/2004   open subacromial decompression, distal clavicle  resection, rotator cuff repair/notes 02/02/2011  . TONSILLECTOMY  1940s  . TOTAL KNEE ARTHROPLASTY Bilateral ~ 2002  . TOTAL THYROIDECTOMY  ~ 1966    SOCIAL HISTORY: Social History   Socioeconomic History  . Marital status: Married    Spouse name: Not on file  . Number of children: Not on file  . Years of education: Not on file  . Highest education level: Not on file  Occupational History  . Not on file  Social Needs  . Financial resource strain: Not on file  . Food insecurity:    Worry: Not on file    Inability: Not on file  . Transportation needs:    Medical: Not on file    Non-medical: Not on file  Tobacco Use  . Smoking status: Never Smoker  . Smokeless tobacco: Never Used  Substance and Sexual Activity  . Alcohol use: No  . Drug use: No  . Sexual activity: Not Currently  Lifestyle  . Physical activity:    Days per week: Not on file    Minutes per session: Not on file  . Stress: Not on file  Relationships  . Social connections:    Talks on phone: Not on file    Gets together: Not on file    Attends religious service: Not on file    Active member of club or organization: Not on file    Attends meetings of clubs or organizations: Not on file    Relationship status: Not on file  . Intimate partner violence:    Fear of current or ex partner: Not on file    Emotionally abused: Not on file    Physically abused: Not on file    Forced sexual activity: Not on file  Other Topics Concern  . Not on file  Social History Narrative  . Not on file    FAMILY HISTORY: Family History  Problem Relation Age of Onset  . Asthma Mother    . Ulcers Father   . Anemia Sister     ALLERGIES:  has No Known Allergies.  MEDICATIONS:  Current Outpatient Medications  Medication Sig Dispense Refill  . ACCU-CHEK FASTCLIX LANCETS MISC 1 each by Does not apply route 2 (two) times daily. DX E11.9 204 each 3  . acetaminophen (TYLENOL) 500 MG tablet Take 500 mg by mouth every 6 (six) hours as needed for mild pain or headache.     Marland Kitchen amLODipine (NORVASC) 10 MG tablet TAKE 1 TABLET BY MOUTH DAILY. 90 tablet 1  . atorvastatin (LIPITOR) 40 MG tablet TAKE 1 TABLET EVERY DAY 90 tablet 0  . Blood Glucose Monitoring Suppl (ACCU-CHEK GUIDE) w/Device KIT 1 each by Does not apply route 2 (two) times daily. DX E11.9 1 kit 0  . Calcium Carb-Cholecalciferol (  CALCIUM 1000 + D PO) Take 1 tablet by mouth.    . carvedilol (COREG) 12.5 MG tablet TAKE 1 TABLET (12.5 MG TOTAL) BY MOUTH 2 (TWO) TIMES DAILY WITH A MEAL. 180 tablet 1  . furosemide (LASIX) 40 MG tablet TAKE 1 TABLET EVERY DAY 90 tablet 0  . glucose blood (ACCU-CHEK GUIDE) test strip Patient to test 2 times daily DX E11.9 Use as instructed 200 each 3  . Lancets Misc. (ACCU-CHEK FASTCLIX LANCET) KIT 1 each by Does not apply route 2 (two) times daily. DX E11.9 1 kit 0  . levothyroxine (SYNTHROID, LEVOTHROID) 125 MCG tablet TAKE 1 TABLET EVERY DAY 90 tablet 3  . Multiple Vitamins-Minerals (WOMENS MULTIVITAMIN PLUS PO) Take 1 tablet by mouth daily.    . pantoprazole (PROTONIX) 20 MG tablet TAKE 1 TABLET BY MOUTH DAILY. 90 tablet 3  . pioglitazone (ACTOS) 30 MG tablet TAKE 1 TABLET EVERY DAY 90 tablet 0  . potassium chloride (K-DUR,KLOR-CON) 10 MEQ tablet TAKE 1 TABLET TWICE DAILY 180 tablet 1  . sacubitril-valsartan (ENTRESTO) 24-26 MG Take 1 tablet by mouth 2 (two) times daily. 180 tablet 3   No current facility-administered medications for this visit.     REVIEW OF SYSTEMS:    A 10+ POINT REVIEW OF SYSTEMS WAS OBTAINED including neurology, dermatology, psychiatry, cardiac, respiratory, lymph,  extremities, GI, GU, Musculoskeletal, constitutional, breasts, reproductive, HEENT.  All pertinent positives are noted in the HPI.  All others are negative.   PHYSICAL EXAMINATION:  ECOG PERFORMANCE STATUS: 1 - Symptomatic but completely ambulatory  Vitals:   01/16/19 0923  BP: (!) 138/53  Pulse: 64  Resp: 18  Temp: 98.6 F (37 C)  SpO2: 100%   Filed Weights   01/16/19 0923  Weight: 123 lb 12.8 oz (56.2 kg)   .Body mass index is 27.76 kg/m.  GENERAL:alert, in no acute distress and comfortable SKIN: no acute rashes, no significant lesions EYES: conjunctiva are pink and non-injected, sclera anicteric OROPHARYNX: MMM, no exudates, no oropharyngeal erythema or ulceration NECK: supple, no JVD LYMPH:  no palpable lymphadenopathy in the cervical, axillary or inguinal regions LUNGS: clear to auscultation b/l with normal respiratory effort HEART: regular rate & rhythm ABDOMEN:  normoactive bowel sounds , non tender, not distended. No palpable hepatosplenomegaly.  Extremity: 1+ pedal edema bilaterally PSYCH: alert & oriented x 3 with fluent speech NEURO: no focal motor/sensory deficits   LABORATORY DATA:    CBC Latest Ref Rng & Units 01/16/2019 12/19/2018 11/21/2018  WBC 4.0 - 10.5 K/uL 5.5 7.0 6.9  Hemoglobin 12.0 - 15.0 g/dL 8.4(L) 9.0(L) 8.6(L)  Hematocrit 36.0 - 46.0 % 27.7(L) 30.0(L) 28.4(L)  Platelets 150 - 400 K/uL 268 293 236    . CBC    Component Value Date/Time   WBC 5.5 01/16/2019 0901   RBC 2.63 (L) 01/16/2019 0901   HGB 8.4 (L) 01/16/2019 0901   HGB 8.9 (L) 08/01/2018 1217   HGB 10.8 (L) 06/20/2017 1326   HCT 27.7 (L) 01/16/2019 0901   HCT 27.3 (L) 08/01/2018 1217   HCT 34.9 06/20/2017 1326   PLT 268 01/16/2019 0901   PLT 307 08/01/2018 1217   MCV 105.3 (H) 01/16/2019 0901   MCV 92 08/01/2018 1217   MCV 94.8 06/20/2017 1326   MCH 31.9 01/16/2019 0901   MCHC 30.3 01/16/2019 0901   RDW 14.6 01/16/2019 0901   RDW 14.7 08/01/2018 1217   RDW 14.2 06/20/2017  1326   LYMPHSABS 0.4 (L) 01/16/2019 0901   LYMPHSABS 0.5 (  L) 08/01/2018 1217   LYMPHSABS 0.6 (L) 06/20/2017 1326   MONOABS 0.5 01/16/2019 0901   MONOABS 0.7 06/20/2017 1326   EOSABS 0.2 01/16/2019 0901   EOSABS 0.2 08/01/2018 1217   BASOSABS 0.0 01/16/2019 0901   BASOSABS 0.1 08/01/2018 1217   BASOSABS 0.0 06/20/2017 1326    . CMP Latest Ref Rng & Units 12/19/2018 11/21/2018 10/23/2018  Glucose 70 - 99 mg/dL 119(H) 189(H) 189(H)  BUN 8 - 23 mg/dL 14 28(H) 21  Creatinine 0.44 - 1.00 mg/dL 0.94 1.12(H) 1.03(H)  Sodium 135 - 145 mmol/L 142 143 145  Potassium 3.5 - 5.1 mmol/L 4.5 4.2 3.9  Chloride 98 - 111 mmol/L 110 109 109  CO2 22 - 32 mmol/L '23 25 26  '$ Calcium 8.9 - 10.3 mg/dL 6.8(L) 8.5(L) 8.8(L)  Total Protein 6.5 - 8.1 g/dL 6.9 6.4(L) 6.7  Total Bilirubin 0.3 - 1.2 mg/dL 0.4 0.4 0.6  Alkaline Phos 38 - 126 U/L 195(H) 220(H) 189(H)  AST 15 - 41 U/L '20 18 20  '$ ALT 0 - 44 U/L 7 <6 6   . Lab Results  Component Value Date   IRON 46 11/21/2018   TIBC 205 (L) 11/21/2018   IRONPCTSAT 23 11/21/2018   (Iron and TIBC)  Lab Results  Component Value Date   FERRITIN 1,925 (H) 12/19/2018       ASSESSMENT & PLAN:   85 y.o. Caucasian female with  #1 Microcytic anemia likely related to iron deficiency from chronic GI bleeding. No known disorder causing chronic inflammation at this time.  #2 history of recurrent GI bleeding related to upper and lower GI tract AVMs and gastric erosions. Has had several admissions with requirement of multiple PRBC transfusions. Dropping ferritin suggests ongoing slow GI losses. No clinically overt GI bleeding noted. Last colonoscopy from 07/24/18 revealed two non-bleeding angiodysplastic lesions which were treated with a monopolar probe  Last small bowel endoscopy from 07/24/18 revealed gastritis with hemorrhage, negative for H.pylori  #3 Hypothyroidism on levothyroxine .  #4 ?? Reaction to [facility] - current hospital admission that shortness of  breath appears to be more likely related to CHF decompensation than allergic reaction to Feraheme. However patient is very anxious and hesitant to get this preparation. Switched to and tolerated Injectafer  PLAN:  -Discussed pt labwork today, 01/16/19; HGB holding at 8.4, WBC normal at 5.5k, PLT normal at 268k. -01/16/19 Ferritin is 906. Last available Ferritin from 12/19/18 was at [place] -Will hold IV Injectafer, and no indication for blood transfusion today -Will continue to check labs monthly -Continue Vitamin B complex -Absolutely no NSAIDs, only Tylenol for mild pain   -Hold PO iron as this makes it difficulty to differentiate the cause of her dark stool color, and we are replacing Iron IV as needed -Will see the pt back in 3 months   Labs and 1unit of PRBC q4weeks x 3 RTC with Dr [name] in 12 weeks   All of the patients questions were answered with apparent satisfaction. The patient knows to call the clinic with any problems, questions or concerns.  The total time spent in the appt was 20 minutes and more than 50% was on counseling and direct patient cares.    Sullivan Lone MD [place] AAHIVMS [facility] Hematology/Oncology Physician [facility]  (Office):       [phone] (Work cell):  [phone] (Fax):           [phone]  I, Baldwin [place], am acting as a scribe for Dr.  Sullivan Lone.   .I have reviewed the above documentation for accuracy and completeness, and I agree with the above. Brunetta Genera MD

## 2019-01-15 NOTE — Telephone Encounter (Signed)
Pt rescheduled for in office visit for August.

## 2019-01-15 NOTE — Telephone Encounter (Signed)
Follow up   Pts son called back and said she would do a phone or virtual visit. They rescheduled for August

## 2019-01-15 NOTE — Telephone Encounter (Signed)
Left message for pt to c/b.  Need to reschedule her appt if possible to 5/5 at 1:30 or 2:30 or 3 pm. Also virtual vs phone.

## 2019-01-16 ENCOUNTER — Other Ambulatory Visit: Payer: Self-pay

## 2019-01-16 ENCOUNTER — Telehealth: Payer: Self-pay | Admitting: Hematology

## 2019-01-16 ENCOUNTER — Inpatient Hospital Stay: Payer: Medicare HMO

## 2019-01-16 ENCOUNTER — Inpatient Hospital Stay: Payer: Medicare HMO | Attending: Hematology | Admitting: Hematology

## 2019-01-16 VITALS — BP 138/53 | HR 64 | Temp 98.6°F | Resp 18 | Ht <= 58 in | Wt 123.8 lb

## 2019-01-16 DIAGNOSIS — E039 Hypothyroidism, unspecified: Secondary | ICD-10-CM

## 2019-01-16 DIAGNOSIS — I5022 Chronic systolic (congestive) heart failure: Secondary | ICD-10-CM | POA: Diagnosis not present

## 2019-01-16 DIAGNOSIS — E7800 Pure hypercholesterolemia, unspecified: Secondary | ICD-10-CM | POA: Insufficient documentation

## 2019-01-16 DIAGNOSIS — Z79899 Other long term (current) drug therapy: Secondary | ICD-10-CM | POA: Diagnosis not present

## 2019-01-16 DIAGNOSIS — E119 Type 2 diabetes mellitus without complications: Secondary | ICD-10-CM | POA: Diagnosis not present

## 2019-01-16 DIAGNOSIS — M1990 Unspecified osteoarthritis, unspecified site: Secondary | ICD-10-CM

## 2019-01-16 DIAGNOSIS — D649 Anemia, unspecified: Secondary | ICD-10-CM

## 2019-01-16 DIAGNOSIS — R609 Edema, unspecified: Secondary | ICD-10-CM | POA: Diagnosis not present

## 2019-01-16 DIAGNOSIS — K922 Gastrointestinal hemorrhage, unspecified: Secondary | ICD-10-CM | POA: Diagnosis not present

## 2019-01-16 DIAGNOSIS — K219 Gastro-esophageal reflux disease without esophagitis: Secondary | ICD-10-CM | POA: Diagnosis not present

## 2019-01-16 DIAGNOSIS — D500 Iron deficiency anemia secondary to blood loss (chronic): Secondary | ICD-10-CM | POA: Diagnosis not present

## 2019-01-16 DIAGNOSIS — I110 Hypertensive heart disease with heart failure: Secondary | ICD-10-CM | POA: Diagnosis not present

## 2019-01-16 DIAGNOSIS — Q2733 Arteriovenous malformation of digestive system vessel: Secondary | ICD-10-CM | POA: Insufficient documentation

## 2019-01-16 LAB — CBC WITH DIFFERENTIAL/PLATELET
Abs Immature Granulocytes: 0.02 10*3/uL (ref 0.00–0.07)
Basophils Absolute: 0 10*3/uL (ref 0.0–0.1)
Basophils Relative: 1 %
Eosinophils Absolute: 0.2 10*3/uL (ref 0.0–0.5)
Eosinophils Relative: 4 %
HCT: 27.7 % — ABNORMAL LOW (ref 36.0–46.0)
Hemoglobin: 8.4 g/dL — ABNORMAL LOW (ref 12.0–15.0)
Immature Granulocytes: 0 %
Lymphocytes Relative: 7 %
Lymphs Abs: 0.4 10*3/uL — ABNORMAL LOW (ref 0.7–4.0)
MCH: 31.9 pg (ref 26.0–34.0)
MCHC: 30.3 g/dL (ref 30.0–36.0)
MCV: 105.3 fL — ABNORMAL HIGH (ref 80.0–100.0)
Monocytes Absolute: 0.5 10*3/uL (ref 0.1–1.0)
Monocytes Relative: 10 %
Neutro Abs: 4.3 10*3/uL (ref 1.7–7.7)
Neutrophils Relative %: 78 %
Platelets: 268 10*3/uL (ref 150–400)
RBC: 2.63 MIL/uL — ABNORMAL LOW (ref 3.87–5.11)
RDW: 14.6 % (ref 11.5–15.5)
WBC: 5.5 10*3/uL (ref 4.0–10.5)
nRBC: 0 % (ref 0.0–0.2)

## 2019-01-16 LAB — FERRITIN: Ferritin: 906 ng/mL — ABNORMAL HIGH (ref 11–307)

## 2019-01-16 LAB — SAMPLE TO BLOOD BANK

## 2019-01-16 NOTE — Telephone Encounter (Signed)
Called and scheduled appt per 4/28 los.  Left a VM of scheduled appt.

## 2019-01-22 ENCOUNTER — Ambulatory Visit: Payer: Medicare HMO | Admitting: Family Medicine

## 2019-01-23 ENCOUNTER — Ambulatory Visit: Payer: Medicare HMO

## 2019-01-23 ENCOUNTER — Other Ambulatory Visit: Payer: Medicare HMO

## 2019-01-24 ENCOUNTER — Ambulatory Visit: Payer: Medicare HMO | Admitting: Cardiology

## 2019-01-25 ENCOUNTER — Telehealth: Payer: Self-pay | Admitting: Family Medicine

## 2019-01-25 NOTE — Telephone Encounter (Signed)
Pt son was advised. KH 

## 2019-01-25 NOTE — Telephone Encounter (Signed)
pts son called and states that Connie [name] legs are swelling and states that she has been taking the lasix everyday he is wanting to know if she needs to up the dose. He can be reached at [phone] with any questions

## 2019-01-25 NOTE — Telephone Encounter (Signed)
Have her do this through the weekend and then touch bases with [place].  Also keep track of her weight

## 2019-01-29 ENCOUNTER — Telehealth: Payer: Self-pay | Admitting: Family Medicine

## 2019-01-29 NOTE — Telephone Encounter (Signed)
Pt son was to take the 80 mg and check weights. Pt was advised to call if she has any changes. Brutus

## 2019-01-29 NOTE — Telephone Encounter (Signed)
pts son called and states that they doubled up the lasix up to 80 mg and and on Thursday she weighed 147 and on Sunday she weighed 140, he wants to know if they is good or is there something else need to be done, states she normally weighs around the 120's richard can be reached at [phone]

## 2019-01-29 NOTE — Telephone Encounter (Signed)
Lets keep up the 80 mg daily as well as daily weights.  See if you can get her scheduled to come in the beginning of next week for blood work

## 2019-02-14 ENCOUNTER — Other Ambulatory Visit: Payer: Self-pay

## 2019-02-14 ENCOUNTER — Inpatient Hospital Stay: Payer: Medicare HMO | Attending: Hematology

## 2019-02-14 ENCOUNTER — Other Ambulatory Visit: Payer: Medicare HMO

## 2019-02-14 ENCOUNTER — Inpatient Hospital Stay: Payer: Medicare HMO

## 2019-02-14 DIAGNOSIS — D500 Iron deficiency anemia secondary to blood loss (chronic): Secondary | ICD-10-CM

## 2019-02-14 DIAGNOSIS — Z825 Family history of asthma and other chronic lower respiratory diseases: Secondary | ICD-10-CM | POA: Insufficient documentation

## 2019-02-14 DIAGNOSIS — Z832 Family history of diseases of the blood and blood-forming organs and certain disorders involving the immune mechanism: Secondary | ICD-10-CM | POA: Insufficient documentation

## 2019-02-14 DIAGNOSIS — K922 Gastrointestinal hemorrhage, unspecified: Secondary | ICD-10-CM | POA: Diagnosis not present

## 2019-02-14 DIAGNOSIS — Q2733 Arteriovenous malformation of digestive system vessel: Secondary | ICD-10-CM | POA: Diagnosis not present

## 2019-02-14 DIAGNOSIS — D649 Anemia, unspecified: Secondary | ICD-10-CM

## 2019-02-14 DIAGNOSIS — E039 Hypothyroidism, unspecified: Secondary | ICD-10-CM | POA: Diagnosis not present

## 2019-02-14 LAB — CBC WITH DIFFERENTIAL/PLATELET
Abs Immature Granulocytes: 0.02 10*3/uL (ref 0.00–0.07)
Basophils Absolute: 0.1 10*3/uL (ref 0.0–0.1)
Basophils Relative: 1 %
Eosinophils Absolute: 0.2 10*3/uL (ref 0.0–0.5)
Eosinophils Relative: 3 %
HCT: 31 % — ABNORMAL LOW (ref 36.0–46.0)
Hemoglobin: 9.4 g/dL — ABNORMAL LOW (ref 12.0–15.0)
Immature Granulocytes: 0 %
Lymphocytes Relative: 8 %
Lymphs Abs: 0.6 10*3/uL — ABNORMAL LOW (ref 0.7–4.0)
MCH: 30.9 pg (ref 26.0–34.0)
MCHC: 30.3 g/dL (ref 30.0–36.0)
MCV: 102 fL — ABNORMAL HIGH (ref 80.0–100.0)
Monocytes Absolute: 0.7 10*3/uL (ref 0.1–1.0)
Monocytes Relative: 9 %
Neutro Abs: 5.6 10*3/uL (ref 1.7–7.7)
Neutrophils Relative %: 79 %
Platelets: 230 10*3/uL (ref 150–400)
RBC: 3.04 MIL/uL — ABNORMAL LOW (ref 3.87–5.11)
RDW: 14.5 % (ref 11.5–15.5)
WBC: 7.1 10*3/uL (ref 4.0–10.5)
nRBC: 0 % (ref 0.0–0.2)

## 2019-02-14 LAB — IRON AND TIBC
Iron: 39 ug/dL — ABNORMAL LOW (ref 41–142)
Saturation Ratios: 18 % — ABNORMAL LOW (ref 21–57)
TIBC: 215 ug/dL — ABNORMAL LOW (ref 236–444)
UIBC: 176 ug/dL (ref 120–384)

## 2019-02-14 LAB — FERRITIN: Ferritin: 702 ng/mL — ABNORMAL HIGH (ref 11–307)

## 2019-02-14 LAB — SAMPLE TO BLOOD BANK

## 2019-02-14 NOTE — Progress Notes (Signed)
Per Dr [name] pt does not need PRBC today after reviewing labs. Pt given labs and informed. Pt asymptomatic.

## 2019-02-26 ENCOUNTER — Other Ambulatory Visit: Payer: Self-pay | Admitting: Family Medicine

## 2019-02-28 ENCOUNTER — Other Ambulatory Visit: Payer: Self-pay | Admitting: Family Medicine

## 2019-02-28 DIAGNOSIS — E1169 Type 2 diabetes mellitus with other specified complication: Secondary | ICD-10-CM

## 2019-02-28 DIAGNOSIS — E785 Hyperlipidemia, unspecified: Secondary | ICD-10-CM

## 2019-03-02 ENCOUNTER — Other Ambulatory Visit: Payer: Self-pay | Admitting: Family Medicine

## 2019-03-13 ENCOUNTER — Other Ambulatory Visit: Payer: Self-pay | Admitting: Hematology

## 2019-03-13 ENCOUNTER — Inpatient Hospital Stay: Payer: Medicare HMO | Attending: Hematology

## 2019-03-13 ENCOUNTER — Inpatient Hospital Stay: Payer: Medicare HMO

## 2019-03-13 ENCOUNTER — Other Ambulatory Visit: Payer: Self-pay

## 2019-03-13 VITALS — BP 135/45 | HR 61 | Temp 98.5°F | Resp 18

## 2019-03-13 DIAGNOSIS — K922 Gastrointestinal hemorrhage, unspecified: Secondary | ICD-10-CM | POA: Insufficient documentation

## 2019-03-13 DIAGNOSIS — E119 Type 2 diabetes mellitus without complications: Secondary | ICD-10-CM | POA: Diagnosis not present

## 2019-03-13 DIAGNOSIS — E039 Hypothyroidism, unspecified: Secondary | ICD-10-CM | POA: Insufficient documentation

## 2019-03-13 DIAGNOSIS — D500 Iron deficiency anemia secondary to blood loss (chronic): Secondary | ICD-10-CM

## 2019-03-13 DIAGNOSIS — Z825 Family history of asthma and other chronic lower respiratory diseases: Secondary | ICD-10-CM | POA: Insufficient documentation

## 2019-03-13 DIAGNOSIS — Z832 Family history of diseases of the blood and blood-forming organs and certain disorders involving the immune mechanism: Secondary | ICD-10-CM | POA: Diagnosis not present

## 2019-03-13 DIAGNOSIS — D649 Anemia, unspecified: Secondary | ICD-10-CM

## 2019-03-13 DIAGNOSIS — I5022 Chronic systolic (congestive) heart failure: Secondary | ICD-10-CM | POA: Diagnosis not present

## 2019-03-13 DIAGNOSIS — Z840 Family history of diseases of the skin and subcutaneous tissue: Secondary | ICD-10-CM | POA: Insufficient documentation

## 2019-03-13 DIAGNOSIS — Z79899 Other long term (current) drug therapy: Secondary | ICD-10-CM | POA: Insufficient documentation

## 2019-03-13 DIAGNOSIS — E785 Hyperlipidemia, unspecified: Secondary | ICD-10-CM | POA: Insufficient documentation

## 2019-03-13 LAB — SAMPLE TO BLOOD BANK

## 2019-03-13 LAB — CBC WITH DIFFERENTIAL/PLATELET
Abs Immature Granulocytes: 0.02 10*3/uL (ref 0.00–0.07)
Basophils Absolute: 0 10*3/uL (ref 0.0–0.1)
Basophils Relative: 1 %
Eosinophils Absolute: 0.1 10*3/uL (ref 0.0–0.5)
Eosinophils Relative: 2 %
HCT: 29 % — ABNORMAL LOW (ref 36.0–46.0)
Hemoglobin: 8.8 g/dL — ABNORMAL LOW (ref 12.0–15.0)
Immature Granulocytes: 0 %
Lymphocytes Relative: 7 %
Lymphs Abs: 0.4 10*3/uL — ABNORMAL LOW (ref 0.7–4.0)
MCH: 30 pg (ref 26.0–34.0)
MCHC: 30.3 g/dL (ref 30.0–36.0)
MCV: 99 fL (ref 80.0–100.0)
Monocytes Absolute: 0.7 10*3/uL (ref 0.1–1.0)
Monocytes Relative: 11 %
Neutro Abs: 4.6 10*3/uL (ref 1.7–7.7)
Neutrophils Relative %: 79 %
Platelets: 219 10*3/uL (ref 150–400)
RBC: 2.93 MIL/uL — ABNORMAL LOW (ref 3.87–5.11)
RDW: 14.2 % (ref 11.5–15.5)
WBC: 5.8 10*3/uL (ref 4.0–10.5)
nRBC: 0 % (ref 0.0–0.2)

## 2019-03-13 LAB — IRON AND TIBC
Iron: 19 ug/dL — ABNORMAL LOW (ref 41–142)
Saturation Ratios: 10 % — ABNORMAL LOW (ref 21–57)
TIBC: 195 ug/dL — ABNORMAL LOW (ref 236–444)
UIBC: 176 ug/dL (ref 120–384)

## 2019-03-13 LAB — FERRITIN: Ferritin: 605 ng/mL — ABNORMAL HIGH (ref 11–307)

## 2019-03-13 MED ORDER — SODIUM CHLORIDE 0.9 % IV SOLN
Freq: Once | INTRAVENOUS | Status: AC
  Administered 2019-03-13: 10:00:00 via INTRAVENOUS
  Filled 2019-03-13: qty 250

## 2019-03-13 MED ORDER — SODIUM CHLORIDE 0.9 % IV SOLN
750.0000 mg | Freq: Once | INTRAVENOUS | Status: AC
  Administered 2019-03-13: 750 mg via INTRAVENOUS
  Filled 2019-03-13: qty 15

## 2019-03-13 NOTE — Patient Instructions (Signed)

## 2019-03-13 NOTE — Progress Notes (Signed)
Verbal order from Dr.Kale: Patient does not need PRBC today. Give Injectafer during infusion time today.

## 2019-04-03 ENCOUNTER — Telehealth: Payer: Self-pay | Admitting: Hematology

## 2019-04-03 NOTE — Telephone Encounter (Signed)
Called pt per 7/13 sch message - unable to reach son - let pt know he needs to call office. If r/s is still needed.

## 2019-04-10 ENCOUNTER — Telehealth: Payer: Self-pay | Admitting: *Deleted

## 2019-04-10 ENCOUNTER — Inpatient Hospital Stay: Payer: Medicare HMO | Admitting: Hematology

## 2019-04-10 ENCOUNTER — Telehealth: Payer: Self-pay | Admitting: Hematology

## 2019-04-10 ENCOUNTER — Inpatient Hospital Stay: Payer: Medicare HMO

## 2019-04-10 NOTE — Telephone Encounter (Signed)
Patient did not arrive for appts this morning. Contacted son, [name] (on [place]). Richard stated he had left several messages last week to reschedule his mother's appts this morning for sometime next week and he was not contacted. Advised him that schedule message would be sent with his contact info to reschedule for next week if possible or soon thereafter. He stated he was frustrated that no one had called him back last week  verbalized understanding. Appts for 7/21 cancelled, and schedule message sent.

## 2019-04-10 NOTE — Telephone Encounter (Signed)
Called patient son Connie [name] re message from desk nurse, son calling to reschedule and no call back. Spoke with Son re appointments for 7/29 @ 12 pm. Per desk nurse reschedule for next week. The only d/t that could be worked in is 7/29.  Patient telephone numbers updated for son to contacted. Returned call was made to son per previous documentation.

## 2019-04-14 ENCOUNTER — Other Ambulatory Visit: Payer: Self-pay | Admitting: Family Medicine

## 2019-04-17 NOTE — Progress Notes (Signed)
**Note Connie-Identified via Obfuscation** Marland Kitchen    HEMATOLOGY/ONCOLOGY CLINIC NOTE  Date of Service: 04/18/19    Patient Care Team: [name], MD as PCP - General (Family Medicine)  CHIEF COMPLAINTS/PURPOSE OF CONSULTATION:  Iron deficiency anemia due to GI bleeding from multiple AVMs  DIAGNOSIS: Iron deficiency anemia likely related to ongoing chronic GI bleeding from multiple AVMs  TREATMENT -IV iron replacement with injectafer as needed (tolerated well). (Had some shortness of breath with IV Feraheme - which was likely fluid overload as opposed to a true allergic reaction but patient has been hesitant to take this)  HISTORY OF PRESENTING ILLNESS:  Please see previous note for details  INTERVAL HISTORY  Connie [name] is a 85 y.o. female here for followup for her IDA. The patient's last visit with [place] was on 01/16/2019. The pt reports that she is doing well overall.  The pt reports no new concerns. Denies bleeding concerns, blood in the stool, black stools, and belly pain.   Lab results today (04/18/2019) of CBC w/diff and CMP is as follows: all values are WNL except for glucose at 110, BUN at 26, calcium at 8.7, total protein at 6.4, GFR at 53, RBC at 3.15, HGB at 9.7, HCT at 30.7, lymphs abs at 0.4.  On review of systems, pt reports no new concernsand denies bleeding concerns, blood in the stool, black stools, belly pain and any other symptoms.   MEDICAL HISTORY:  Past Medical History:  Diagnosis Date  . Allergy    RHINITIS  . Angiodysplasia of duodenum    hx/notes 11/21/2015  . Angiodysplasia of stomach    hx/notes 11/21/2015  . Arthritis    "joints ache"  . Bleeding gastric ulcer    hx/notes 11/21/2015  . Chronic blood loss anemia    secondary to angiodysplasia of the stomach and duodenum as well as history of bleeding gastric ulcer hx/notes 11/21/2015  . Chronic systolic CHF (congestive heart failure) ([place]) 12/23/2015  . History of blood transfusion 01/2015; 06/2015; 11/21/2015  . Hypercholesteremia   .  Hypertension   . Hypothyroidism   . Obesity   . Pneumonia "several times"  . Type II diabetes mellitus ([place])     SURGICAL HISTORY: Past Surgical History:  Procedure Laterality Date  . BIOPSY  07/24/2018   Procedure: BIOPSY;  Surgeon: Carol Ada, MD;  Location: [facility];  Service: Endoscopy;;  . CARDIAC CATHETERIZATION N/A 12/19/2015   Procedure: Left Heart Cath and Coronary Angiography;  Surgeon: Leonie Man, MD;  Location: [place] [facility];  Service: Cardiovascular;  Laterality: N/A;  . COLONOSCOPY N/A 08/30/2014   Procedure: COLONOSCOPY;  Surgeon: Beryle Beams, MD;  Location: [facility];  Service: Endoscopy;  Laterality: N/A;  . COLONOSCOPY N/A 02/21/2015   Procedure: COLONOSCOPY;  Surgeon: Carol Ada, MD;  Location: [facility];  Service: Endoscopy;  Laterality: N/A;  . COLONOSCOPY WITH PROPOFOL N/A 07/24/2018   Procedure: COLONOSCOPY WITH PROPOFOL;  Surgeon: Carol Ada, MD;  Location: [facility];  Service: Endoscopy;  Laterality: N/A;  . ENTEROSCOPY N/A 06/06/2015   Procedure: ENTEROSCOPY;  Surgeon: Carol Ada, MD;  Location: [facility];  Service: Endoscopy;  Laterality: N/A;  . ENTEROSCOPY N/A 07/24/2018   Procedure: ENTEROSCOPY;  Surgeon: Carol Ada, MD;  Location: [facility];  Service: Endoscopy;  Laterality: N/A;  . FRACTURE SURGERY    . GIVENS CAPSULE STUDY N/A 05/15/2015   Procedure: GIVENS CAPSULE STUDY;  Surgeon: Carol Ada, MD;  Location: [facility] ENDOSCOPY;  Service: Endoscopy;  Laterality: N/A;  . HOT HEMOSTASIS  N/A 08/30/2014   Procedure: HOT HEMOSTASIS (ARGON PLASMA COAGULATION/BICAP);  Surgeon: Beryle Beams, MD;  Location: [facility];  Service: Endoscopy;  Laterality: N/A;  . HOT HEMOSTASIS N/A 06/06/2015   Procedure: HOT HEMOSTASIS (ARGON PLASMA COAGULATION/BICAP);  Surgeon: Carol Ada, MD;  Location: [facility];  Service: Endoscopy;  Laterality: N/A;  . HOT HEMOSTASIS N/A 07/24/2018   Procedure: HOT HEMOSTASIS (ARGON PLASMA  COAGULATION/BICAP);  Surgeon: Carol Ada, MD;  Location: [facility];  Service: Endoscopy;  Laterality: N/A;  . JOINT REPLACEMENT    . REVISION TOTAL KNEE ARTHROPLASTY Bilateral 2004-2015  . SHOULDER OPEN ROTATOR CUFF REPAIR Right 12/2004   open subacromial decompression, distal clavicle  resection, rotator cuff repair/notes 02/02/2011  . TONSILLECTOMY  1940s  . TOTAL KNEE ARTHROPLASTY Bilateral ~ 2002  . TOTAL THYROIDECTOMY  ~ 1966    SOCIAL HISTORY: Social History   Socioeconomic History  . Marital status: Married    Spouse name: Not on file  . Number of children: Not on file  . Years of education: Not on file  . Highest education level: Not on file  Occupational History  . Not on file  Social Needs  . Financial resource strain: Not on file  . Food insecurity    Worry: Not on file    Inability: Not on file  . Transportation needs    Medical: Not on file    Non-medical: Not on file  Tobacco Use  . Smoking status: Never Smoker  . Smokeless tobacco: Never Used  Substance and Sexual Activity  . Alcohol use: No  . Drug use: No  . Sexual activity: Not Currently  Lifestyle  . Physical activity    Days per week: Not on file    Minutes per session: Not on file  . Stress: Not on file  Relationships  . Social Herbalist on phone: Not on file    Gets together: Not on file    Attends religious service: Not on file    Active member of club or organization: Not on file    Attends meetings of clubs or organizations: Not on file    Relationship status: Not on file  . Intimate partner violence    Fear of current or ex partner: Not on file    Emotionally abused: Not on file    Physically abused: Not on file    Forced sexual activity: Not on file  Other Topics Concern  . Not on file  Social History Narrative  . Not on file    FAMILY HISTORY: Family History  Problem Relation Age of Onset  . Asthma Mother   . Ulcers Father   . Anemia Sister     ALLERGIES:   has No Known Allergies.  MEDICATIONS:  Current Outpatient Medications  Medication Sig Dispense Refill  . ACCU-CHEK FASTCLIX LANCETS MISC 1 each by Does not apply route 2 (two) times daily. DX E11.9 204 each 3  . acetaminophen (TYLENOL) 500 MG tablet Take 500 mg by mouth every 6 (six) hours as needed for mild pain or headache.     Marland Kitchen amLODipine (NORVASC) 10 MG tablet TAKE 1 TABLET EVERY DAY 90 tablet 1  . atorvastatin (LIPITOR) 40 MG tablet TAKE 1 TABLET EVERY DAY 90 tablet 0  . Blood Glucose Monitoring Suppl (ACCU-CHEK GUIDE) w/Device KIT 1 each by Does not apply route 2 (two) times daily. DX E11.9 1 kit 0  . Calcium Carb-Cholecalciferol (CALCIUM 1000 + D PO) Take 1 tablet by  mouth.    . carvedilol (COREG) 12.5 MG tablet TAKE 1 TABLET (12.5 MG TOTAL) BY MOUTH 2 (TWO) TIMES DAILY WITH A MEAL. 180 tablet 1  . furosemide (LASIX) 40 MG tablet TAKE 1 TABLET EVERY DAY 90 tablet 0  . glucose blood (ACCU-CHEK GUIDE) test strip Patient to test 2 times daily DX E11.9 Use as instructed 200 each 3  . Lancets Misc. (ACCU-CHEK FASTCLIX LANCET) KIT 1 each by Does not apply route 2 (two) times daily. DX E11.9 1 kit 0  . levothyroxine (SYNTHROID) 125 MCG tablet TAKE 1 TABLET EVERY DAY 90 tablet 3  . Multiple Vitamins-Minerals (WOMENS MULTIVITAMIN PLUS PO) Take 1 tablet by mouth daily.    . pantoprazole (PROTONIX) 20 MG tablet TAKE 1 TABLET BY MOUTH DAILY. 90 tablet 3  . pioglitazone (ACTOS) 30 MG tablet TAKE 1 TABLET EVERY DAY 90 tablet 0  . potassium chloride (K-DUR,KLOR-CON) 10 MEQ tablet TAKE 1 TABLET TWICE DAILY 180 tablet 1  . sacubitril-valsartan (ENTRESTO) 24-26 MG Take 1 tablet by mouth 2 (two) times daily. 180 tablet 3   No current facility-administered medications for this visit.     REVIEW OF SYSTEMS:    A 10+ POINT REVIEW OF SYSTEMS WAS OBTAINED including neurology, dermatology, psychiatry, cardiac, respiratory, lymph, extremities, GI, GU, Musculoskeletal, constitutional, breasts, reproductive,  HEENT.  All pertinent positives are noted in the HPI.  All others are negative.   PHYSICAL EXAMINATION:  ECOG PERFORMANCE STATUS: 1 - Symptomatic but completely ambulatory  Vitals:   04/18/19 1216  BP: (!) 162/55  Pulse: 62  Resp: 18  Temp: 98.6 F (37 C)  SpO2: 100%   Filed Weights   04/18/19 1216  Weight: 123 lb 11.2 oz (56.1 kg)   .Body mass index is 27.73 kg/m.   GENERAL:alert, in no acute distress and comfortable SKIN: no acute rashes, no significant lesions EYES: conjunctiva are pink and non-injected, sclera anicteric OROPHARYNX: MMM, no exudates, no oropharyngeal erythema or ulceration NECK: supple, no JVD LYMPH:  no palpable lymphadenopathy in the cervical, axillary or inguinal regions LUNGS: clear to auscultation b/l with normal respiratory effort HEART: regular rate & rhythm ABDOMEN:  normoactive bowel sounds , non tender, not distended. No palpable hepatosplenomegaly.  Extremity: 1-2+ pedal edema, worse in the left ankle than right PSYCH: alert & oriented x 3 with fluent speech NEURO: no focal motor/sensory deficits   LABORATORY DATA:    CBC Latest Ref Rng & Units 04/18/2019 03/13/2019 02/14/2019  WBC 4.0 - 10.5 K/uL 5.4 5.8 7.1  Hemoglobin 12.0 - 15.0 g/dL 9.7(L) 8.8(L) 9.4(L)  Hematocrit 36.0 - 46.0 % 30.7(L) 29.0(L) 31.0(L)  Platelets 150 - 400 K/uL 217 219 230    . CBC    Component Value Date/Time   WBC 5.4 04/18/2019 1155   RBC 3.15 (L) 04/18/2019 1155   HGB 9.7 (L) 04/18/2019 1155   HGB 8.9 (L) 08/01/2018 1217   HGB 10.8 (L) 06/20/2017 1326   HCT 30.7 (L) 04/18/2019 1155   HCT 27.3 (L) 08/01/2018 1217   HCT 34.9 06/20/2017 1326   PLT 217 04/18/2019 1155   PLT 307 08/01/2018 1217   MCV 97.5 04/18/2019 1155   MCV 92 08/01/2018 1217   MCV 94.8 06/20/2017 1326   MCH 30.8 04/18/2019 1155   MCHC 31.6 04/18/2019 1155   RDW 15.3 04/18/2019 1155   RDW 14.7 08/01/2018 1217   RDW 14.2 06/20/2017 1326   LYMPHSABS 0.4 (L) 04/18/2019 1155   LYMPHSABS  0.5 (L) 08/01/2018 1217  LYMPHSABS 0.6 (L) 06/20/2017 1326   MONOABS 0.6 04/18/2019 1155   MONOABS 0.7 06/20/2017 1326   EOSABS 0.2 04/18/2019 1155   EOSABS 0.2 08/01/2018 1217   BASOSABS 0.0 04/18/2019 1155   BASOSABS 0.1 08/01/2018 1217   BASOSABS 0.0 06/20/2017 1326    . CMP Latest Ref Rng & Units 04/18/2019 12/19/2018 11/21/2018  Glucose 70 - 99 mg/dL 110(H) 119(H) 189(H)  BUN 8 - 23 mg/dL 26(H) 14 28(H)  Creatinine 0.44 - 1.00 mg/dL 0.98 0.94 1.12(H)  Sodium 135 - 145 mmol/L 143 142 143  Potassium 3.5 - 5.1 mmol/L 4.3 4.5 4.2  Chloride 98 - 111 mmol/L 111 110 109  CO2 22 - 32 mmol/L '25 23 25  ' Calcium 8.9 - 10.3 mg/dL 8.7(L) 6.8(L) 8.5(L)  Total Protein 6.5 - 8.1 g/dL 6.4(L) 6.9 6.4(L)  Total Bilirubin 0.3 - 1.2 mg/dL 0.4 0.4 0.4  Alkaline Phos 38 - 126 U/L 119 195(H) 220(H)  AST 15 - 41 U/L '18 20 18  ' ALT 0 - 44 U/L 7 7 <6   . Lab Results  Component Value Date   IRON 19 (L) 03/13/2019   TIBC 195 (L) 03/13/2019   IRONPCTSAT 10 (L) 03/13/2019   (Iron and TIBC)  Lab Results  Component Value Date   FERRITIN 605 (H) 03/13/2019    . Lab Results  Component Value Date   IRON 51 04/18/2019   TIBC 208 (L) 04/18/2019   IRONPCTSAT 24 04/18/2019   (Iron and TIBC)  Lab Results  Component Value Date   FERRITIN 1,043 (H) 04/18/2019      ASSESSMENT & PLAN:   85 y.o. Caucasian female with  #1 Microcytic anemia likely related to iron deficiency from chronic GI bleeding. No known disorder causing chronic inflammation at this time.  #2 history of recurrent GI bleeding related to upper and lower GI tract AVMs and gastric erosions. Has had several admissions with requirement of multiple PRBC transfusions. Dropping ferritin suggests ongoing slow GI losses. No clinically overt GI bleeding noted. Last colonoscopy from 07/24/18 revealed two non-bleeding angiodysplastic lesions which were treated with a monopolar probe  Last small bowel endoscopy from 07/24/18 revealed gastritis  with hemorrhage, negative for H.pylori  #3 Hypothyroidism on levothyroxine .  #4 ?? Reaction to [facility] - current hospital admission that shortness of breath appears to be more likely related to CHF decompensation than allergic reaction to Feraheme. However patient is very anxious and hesitant to get this preparation. Switched to and tolerated Injectafer  PLAN:  -Discussed pt labwork today, 04/18/2019 -Discussed that the patient's iron levels have been trending downwards based on last ferritin a month ago. -Continue Vitamin B complex -Absolutely no NSAIDs, only Tylenol for mild pain   -Hold PO iron as this makes it difficulty to differentiate the cause of her dark stool color, and we are replacing Iron IV as needed -No indication for a blood transfusion today -Will monitor with monthly labs -Will see the pt back in 3 months   Labs and 1unit of PRBC q4weeks x 4  IV Injectafer x 1 on return to clinic in 4 weeks RTC with Dr [name] in 12 weeks   All of the patients questions were answered with apparent satisfaction. The patient knows to call the clinic with any problems, questions or concerns.  The total time spent in the appt was 15 minutes and more than 50% was on counseling and direct patient cares.   Connie Lone MD Connie AAHIVMS [facility] Hematology/Oncology Physician [facility]  [place]  (Office):       [phone] (Work cell):  [phone] (Fax):           [phone]  I, Connie [name], am acting as a scribe for Dr. [name]  .I have reviewed the above documentation for accuracy and completeness, and I agree with the above. Connie Genera MD

## 2019-04-18 ENCOUNTER — Inpatient Hospital Stay: Payer: Medicare HMO

## 2019-04-18 ENCOUNTER — Other Ambulatory Visit: Payer: Self-pay

## 2019-04-18 ENCOUNTER — Inpatient Hospital Stay: Payer: Medicare HMO | Attending: Hematology | Admitting: Hematology

## 2019-04-18 ENCOUNTER — Telehealth: Payer: Self-pay | Admitting: Hematology

## 2019-04-18 VITALS — BP 162/55 | HR 62 | Temp 98.6°F | Resp 18 | Ht <= 58 in | Wt 123.7 lb

## 2019-04-18 DIAGNOSIS — Z79899 Other long term (current) drug therapy: Secondary | ICD-10-CM | POA: Diagnosis not present

## 2019-04-18 DIAGNOSIS — D500 Iron deficiency anemia secondary to blood loss (chronic): Secondary | ICD-10-CM

## 2019-04-18 DIAGNOSIS — Z840 Family history of diseases of the skin and subcutaneous tissue: Secondary | ICD-10-CM | POA: Diagnosis not present

## 2019-04-18 DIAGNOSIS — Z825 Family history of asthma and other chronic lower respiratory diseases: Secondary | ICD-10-CM | POA: Diagnosis not present

## 2019-04-18 DIAGNOSIS — E785 Hyperlipidemia, unspecified: Secondary | ICD-10-CM

## 2019-04-18 DIAGNOSIS — Q2733 Arteriovenous malformation of digestive system vessel: Secondary | ICD-10-CM | POA: Diagnosis not present

## 2019-04-18 DIAGNOSIS — R0602 Shortness of breath: Secondary | ICD-10-CM

## 2019-04-18 DIAGNOSIS — K922 Gastrointestinal hemorrhage, unspecified: Secondary | ICD-10-CM | POA: Diagnosis not present

## 2019-04-18 DIAGNOSIS — Z832 Family history of diseases of the blood and blood-forming organs and certain disorders involving the immune mechanism: Secondary | ICD-10-CM | POA: Diagnosis not present

## 2019-04-18 DIAGNOSIS — I5022 Chronic systolic (congestive) heart failure: Secondary | ICD-10-CM | POA: Diagnosis not present

## 2019-04-18 DIAGNOSIS — D649 Anemia, unspecified: Secondary | ICD-10-CM

## 2019-04-18 DIAGNOSIS — E1169 Type 2 diabetes mellitus with other specified complication: Secondary | ICD-10-CM | POA: Diagnosis not present

## 2019-04-18 DIAGNOSIS — E039 Hypothyroidism, unspecified: Secondary | ICD-10-CM | POA: Diagnosis not present

## 2019-04-18 DIAGNOSIS — M1990 Unspecified osteoarthritis, unspecified site: Secondary | ICD-10-CM

## 2019-04-18 LAB — IRON AND TIBC
Iron: 51 ug/dL (ref 41–142)
Saturation Ratios: 24 % (ref 21–57)
TIBC: 208 ug/dL — ABNORMAL LOW (ref 236–444)
UIBC: 157 ug/dL (ref 120–384)

## 2019-04-18 LAB — CBC WITH DIFFERENTIAL/PLATELET
Abs Immature Granulocytes: 0.04 10*3/uL (ref 0.00–0.07)
Basophils Absolute: 0 10*3/uL (ref 0.0–0.1)
Basophils Relative: 0 %
Eosinophils Absolute: 0.2 10*3/uL (ref 0.0–0.5)
Eosinophils Relative: 4 %
HCT: 30.7 % — ABNORMAL LOW (ref 36.0–46.0)
Hemoglobin: 9.7 g/dL — ABNORMAL LOW (ref 12.0–15.0)
Immature Granulocytes: 1 %
Lymphocytes Relative: 8 %
Lymphs Abs: 0.4 10*3/uL — ABNORMAL LOW (ref 0.7–4.0)
MCH: 30.8 pg (ref 26.0–34.0)
MCHC: 31.6 g/dL (ref 30.0–36.0)
MCV: 97.5 fL (ref 80.0–100.0)
Monocytes Absolute: 0.6 10*3/uL (ref 0.1–1.0)
Monocytes Relative: 10 %
Neutro Abs: 4.1 10*3/uL (ref 1.7–7.7)
Neutrophils Relative %: 77 %
Platelets: 217 10*3/uL (ref 150–400)
RBC: 3.15 MIL/uL — ABNORMAL LOW (ref 3.87–5.11)
RDW: 15.3 % (ref 11.5–15.5)
WBC: 5.4 10*3/uL (ref 4.0–10.5)
nRBC: 0 % (ref 0.0–0.2)

## 2019-04-18 LAB — CMP (CANCER CENTER ONLY)
ALT: 7 U/L (ref 0–44)
AST: 18 U/L (ref 15–41)
Albumin: 3.6 g/dL (ref 3.5–5.0)
Alkaline Phosphatase: 119 U/L (ref 38–126)
Anion gap: 7 (ref 5–15)
BUN: 26 mg/dL — ABNORMAL HIGH (ref 8–23)
CO2: 25 mmol/L (ref 22–32)
Calcium: 8.7 mg/dL — ABNORMAL LOW (ref 8.9–10.3)
Chloride: 111 mmol/L (ref 98–111)
Creatinine: 0.98 mg/dL (ref 0.44–1.00)
GFR, Est AFR Am: >60 mL/min (ref >60)
GFR, Estimated: 53 mL/min — ABNORMAL LOW (ref >60)
Glucose, Bld: 110 mg/dL — ABNORMAL HIGH (ref 70–99)
Potassium: 4.3 mmol/L (ref 3.5–5.1)
Sodium: 143 mmol/L (ref 135–145)
Total Bilirubin: 0.4 mg/dL (ref 0.3–1.2)
Total Protein: 6.4 g/dL — ABNORMAL LOW (ref 6.5–8.1)

## 2019-04-18 LAB — SAMPLE TO BLOOD BANK

## 2019-04-18 LAB — FERRITIN: Ferritin: 1043 ng/mL — ABNORMAL HIGH (ref 11–307)

## 2019-04-18 NOTE — Telephone Encounter (Signed)
Scheduled appt per 7/29 los. ° °Printed calendar and avs. °

## 2019-04-20 ENCOUNTER — Other Ambulatory Visit: Payer: Self-pay | Admitting: Family Medicine

## 2019-05-09 ENCOUNTER — Other Ambulatory Visit: Payer: Self-pay | Admitting: Family Medicine

## 2019-05-09 DIAGNOSIS — E1169 Type 2 diabetes mellitus with other specified complication: Secondary | ICD-10-CM

## 2019-05-09 DIAGNOSIS — E785 Hyperlipidemia, unspecified: Secondary | ICD-10-CM

## 2019-05-16 ENCOUNTER — Other Ambulatory Visit: Payer: Self-pay

## 2019-05-16 ENCOUNTER — Inpatient Hospital Stay: Payer: Medicare HMO

## 2019-05-16 ENCOUNTER — Inpatient Hospital Stay: Payer: Medicare HMO | Attending: Hematology

## 2019-05-16 DIAGNOSIS — D500 Iron deficiency anemia secondary to blood loss (chronic): Secondary | ICD-10-CM | POA: Insufficient documentation

## 2019-05-16 DIAGNOSIS — K922 Gastrointestinal hemorrhage, unspecified: Secondary | ICD-10-CM | POA: Diagnosis not present

## 2019-05-16 DIAGNOSIS — D649 Anemia, unspecified: Secondary | ICD-10-CM

## 2019-05-16 LAB — SAMPLE TO BLOOD BANK

## 2019-05-16 LAB — IRON AND TIBC
Iron: 40 ug/dL — ABNORMAL LOW (ref 41–142)
Saturation Ratios: 18 % — ABNORMAL LOW (ref 21–57)
TIBC: 216 ug/dL — ABNORMAL LOW (ref 236–444)
UIBC: 176 ug/dL (ref 120–384)

## 2019-05-16 LAB — CBC WITH DIFFERENTIAL/PLATELET
Abs Immature Granulocytes: 0.02 10*3/uL (ref 0.00–0.07)
Basophils Absolute: 0 10*3/uL (ref 0.0–0.1)
Basophils Relative: 1 %
Eosinophils Absolute: 0.2 10*3/uL (ref 0.0–0.5)
Eosinophils Relative: 4 %
HCT: 28.5 % — ABNORMAL LOW (ref 36.0–46.0)
Hemoglobin: 8.8 g/dL — ABNORMAL LOW (ref 12.0–15.0)
Immature Granulocytes: 0 %
Lymphocytes Relative: 6 %
Lymphs Abs: 0.4 10*3/uL — ABNORMAL LOW (ref 0.7–4.0)
MCH: 30.2 pg (ref 26.0–34.0)
MCHC: 30.9 g/dL (ref 30.0–36.0)
MCV: 97.9 fL (ref 80.0–100.0)
Monocytes Absolute: 0.6 10*3/uL (ref 0.1–1.0)
Monocytes Relative: 11 %
Neutro Abs: 4.3 10*3/uL (ref 1.7–7.7)
Neutrophils Relative %: 78 %
Platelets: 224 10*3/uL (ref 150–400)
RBC: 2.91 MIL/uL — ABNORMAL LOW (ref 3.87–5.11)
RDW: 15 % (ref 11.5–15.5)
WBC: 5.5 10*3/uL (ref 4.0–10.5)
nRBC: 0 % (ref 0.0–0.2)

## 2019-05-16 LAB — CMP (CANCER CENTER ONLY)
ALT: 7 U/L (ref 0–44)
AST: 20 U/L (ref 15–41)
Albumin: 3.6 g/dL (ref 3.5–5.0)
Alkaline Phosphatase: 102 U/L (ref 38–126)
Anion gap: 9 (ref 5–15)
BUN: 26 mg/dL — ABNORMAL HIGH (ref 8–23)
CO2: 26 mmol/L (ref 22–32)
Calcium: 8.6 mg/dL — ABNORMAL LOW (ref 8.9–10.3)
Chloride: 107 mmol/L (ref 98–111)
Creatinine: 1.17 mg/dL — ABNORMAL HIGH (ref 0.44–1.00)
GFR, Est AFR Am: 50 mL/min — ABNORMAL LOW (ref >60)
GFR, Estimated: 43 mL/min — ABNORMAL LOW (ref >60)
Glucose, Bld: 114 mg/dL — ABNORMAL HIGH (ref 70–99)
Potassium: 4.4 mmol/L (ref 3.5–5.1)
Sodium: 142 mmol/L (ref 135–145)
Total Bilirubin: 0.6 mg/dL (ref 0.3–1.2)
Total Protein: 6.3 g/dL — ABNORMAL LOW (ref 6.5–8.1)

## 2019-05-16 LAB — FERRITIN: Ferritin: 573 ng/mL — ABNORMAL HIGH (ref 11–307)

## 2019-05-16 MED ORDER — SODIUM CHLORIDE 0.9 % IV SOLN
750.0000 mg | Freq: Once | INTRAVENOUS | Status: AC
  Administered 2019-05-16: 750 mg via INTRAVENOUS
  Filled 2019-05-16: qty 15

## 2019-05-16 MED ORDER — SODIUM CHLORIDE 0.9 % IV SOLN
INTRAVENOUS | Status: DC
  Administered 2019-05-16: 11:00:00 via INTRAVENOUS
  Filled 2019-05-16: qty 250

## 2019-05-16 NOTE — Patient Instructions (Signed)

## 2019-05-18 ENCOUNTER — Other Ambulatory Visit: Payer: Self-pay | Admitting: Family Medicine

## 2019-05-18 NOTE — Telephone Encounter (Signed)
Is this appropriate?  

## 2019-05-21 ENCOUNTER — Ambulatory Visit (INDEPENDENT_AMBULATORY_CARE_PROVIDER_SITE_OTHER): Payer: Medicare HMO | Admitting: Cardiology

## 2019-05-21 ENCOUNTER — Other Ambulatory Visit: Payer: Self-pay

## 2019-05-21 ENCOUNTER — Encounter: Payer: Self-pay | Admitting: Cardiology

## 2019-05-21 VITALS — BP 144/50 | HR 77 | Ht <= 58 in | Wt 128.0 lb

## 2019-05-21 DIAGNOSIS — D649 Anemia, unspecified: Secondary | ICD-10-CM

## 2019-05-21 DIAGNOSIS — I428 Other cardiomyopathies: Secondary | ICD-10-CM | POA: Diagnosis not present

## 2019-05-21 DIAGNOSIS — E7800 Pure hypercholesterolemia, unspecified: Secondary | ICD-10-CM | POA: Diagnosis not present

## 2019-05-21 NOTE — Patient Instructions (Signed)
  Medication Instructions:  The current medical regimen is effective;  continue present plan and medications.  If you need a refill on your cardiac medications before your next appointment, please call your pharmacy.   Follow-Up: Follow up in 1 year with Dr. Skains.  You will receive a letter in the mail 2 months before you are due.  Please call us when you receive this letter to schedule your follow up appointment.  Thank you for choosing [facility]!!     

## 2019-05-21 NOTE — Progress Notes (Signed)
Cardiology Office Note:    Date:  05/21/2019   ID:  Connie [name], DOB [birth year], MRN [id number]  PCP:  [name], MD  Cardiologist:  Candee Furbish, MD  Electrophysiologist:  None   Referring MD: [name], MD     History of Present Illness:    Connie [name] is a 85 y.o. female here for follow-up of nonischemic cardiomyopathy which seems to have improved significantly with a EF of 55 to 60% on echocardiogram 05/18/2017.  On [name] however her son states that she has not had that medication for maybe 2 months at home.  [name] in our clinic is helping her with patient assistance to get the medication.  She was placed on Entresto in the hospital setting. diabetes followed by Dr. Redmond School.  Chronic lower extremity edema, on Lasix.  She was in the hospital, discharged on 07/25/2018 with presentation of GI bleed, demonstrating duodenal and gastric AVMs after 1 week history of generalized weakness.  Hemoglobin 5.8.  2 units of blood.  Endoscopy showed gastritis with hemorrhage and 2 nonbleeding colonic angiodysplastic lesions.  PPI prescribed.  05/21/2019-here for follow-up of nonischemic cardiomyopathy now with normalized ejection fraction..  Prior GI bleed 2019.  Hemoglobin as low as 5.8.  So far her hemoglobin is stable at 8.8.  Renal function also stable.  No fevers chills nausea vomiting syncope bleeding.   Past Medical History:  Diagnosis Date  . Allergy    RHINITIS  . Angiodysplasia of duodenum    hx/notes 11/21/2015  . Angiodysplasia of stomach    hx/notes 11/21/2015  . Arthritis    "joints ache"  . Bleeding gastric ulcer    hx/notes 11/21/2015  . Chronic blood loss anemia    secondary to angiodysplasia of the stomach and duodenum as well as history of bleeding gastric ulcer hx/notes 11/21/2015  . Chronic systolic CHF (congestive heart failure) ([place]) 12/23/2015  . History of blood transfusion 01/2015; 06/2015; 11/21/2015  . Hypercholesteremia   . Hypertension   .  Hypothyroidism   . Obesity   . Pneumonia "several times"  . Type II diabetes mellitus ([place])     Past Surgical History:  Procedure Laterality Date  . BIOPSY  07/24/2018   Procedure: BIOPSY;  Surgeon: Carol Ada, MD;  Location: [facility];  Service: Endoscopy;;  . CARDIAC CATHETERIZATION N/A 12/19/2015   Procedure: Left Heart Cath and Coronary Angiography;  Surgeon: Leonie Man, MD;  Location: [facility];  Service: Cardiovascular;  Laterality: N/A;  . COLONOSCOPY N/A 08/30/2014   Procedure: COLONOSCOPY;  Surgeon: Beryle Beams, MD;  Location: [facility];  Service: Endoscopy;  Laterality: N/A;  . COLONOSCOPY N/A 02/21/2015   Procedure: COLONOSCOPY;  Surgeon: Carol Ada, MD;  Location: [facility] ENDOSCOPY;  Service: Endoscopy;  Laterality: N/A;  . COLONOSCOPY WITH PROPOFOL N/A 07/24/2018   Procedure: COLONOSCOPY WITH PROPOFOL;  Surgeon: Carol Ada, MD;  Location: [facility];  Service: Endoscopy;  Laterality: N/A;  . ENTEROSCOPY N/A 06/06/2015   Procedure: ENTEROSCOPY;  Surgeon: Carol Ada, MD;  Location: [facility];  Service: Endoscopy;  Laterality: N/A;  . ENTEROSCOPY N/A 07/24/2018   Procedure: ENTEROSCOPY;  Surgeon: Carol Ada, MD;  Location: [facility];  Service: Endoscopy;  Laterality: N/A;  . FRACTURE SURGERY    . GIVENS CAPSULE STUDY N/A 05/15/2015   Procedure: GIVENS CAPSULE STUDY;  Surgeon: Carol Ada, MD;  Location[facility] END[facility]COPY;  Service: Endoscopy;  Laterality: N/A;  . HOT HEMOSTASIS N/A 08/30/2014   Procedure: HOT HEMOSTASIS (  ARGON PLASMA COAGULATION/BICAP);  Surgeon: Beryle Beams, MD;  Location: [facility];  Service: Endoscopy;  Laterality: N/A;  . HOT HEMOSTASIS N/A 06/06/2015   Procedure: HOT HEMOSTASIS (ARGON PLASMA COAGULATION/BICAP);  Surgeon: Carol Ada, MD;  Location: [facility];  Service: Endoscopy;  Laterality: N/A;  . HOT HEMOSTASIS N/A 07/24/2018   Procedure: HOT HEMOSTASIS (ARGON PLASMA COAGULATION/BICAP);  Surgeon: Carol Ada, MD;   Location: [facility];  Service: Endoscopy;  Laterality: N/A;  . JOINT REPLACEMENT    . REVISION TOTAL KNEE ARTHROPLASTY Bilateral 2004-2015  . SHOULDER OPEN ROTATOR CUFF REPAIR Right 12/2004   open subacromial decompression, distal clavicle  resection, rotator cuff repair/notes 02/02/2011  . TONSILLECTOMY  1940s  . TOTAL KNEE ARTHROPLASTY Bilateral ~ 2002  . TOTAL THYROIDECTOMY  ~ 1966    Current Medications: Current Meds  Medication Sig  . ACCU-CHEK FASTCLIX LANCETS MISC 1 each by Does not apply route 2 (two) times daily. DX E11.9  . acetaminophen (TYLENOL) 500 MG tablet Take 500 mg by mouth every 6 (six) hours as needed for mild pain or headache.   Marland Kitchen amLODipine (NORVASC) 10 MG tablet TAKE 1 TABLET EVERY DAY  . atorvastatin (LIPITOR) 40 MG tablet TAKE 1 TABLET EVERY DAY  . Blood Glucose Monitoring Suppl (ACCU-CHEK GUIDE) w/Device KIT 1 each by Does not apply route 2 (two) times daily. DX E11.9  . Calcium Carb-Cholecalciferol (CALCIUM 1000 + D PO) Take 1 tablet by mouth.  . carvedilol (COREG) 12.5 MG tablet TAKE 1 TABLET (12.5 MG TOTAL) BY MOUTH 2 (TWO) TIMES DAILY WITH A MEAL.  . furosemide (LASIX) 40 MG tablet TAKE 1 TABLET EVERY DAY  . glucose blood (ACCU-CHEK GUIDE) test strip Patient to test 2 times daily DX E11.9 Use as instructed  . Lancets Misc. (ACCU-CHEK FASTCLIX LANCET) KIT 1 each by Does not apply route 2 (two) times daily. DX E11.9  . levothyroxine (SYNTHROID) 125 MCG tablet TAKE 1 TABLET EVERY DAY  . Multiple Vitamins-Minerals (WOMENS MULTIVITAMIN PLUS PO) Take 1 tablet by mouth daily.  . pantoprazole (PROTONIX) 20 MG tablet TAKE 1 TABLET BY MOUTH DAILY.  Marland Kitchen pioglitazone (ACTOS) 30 MG tablet TAKE 1 TABLET EVERY DAY  . potassium chloride (K-DUR) 10 MEQ tablet TAKE 1 TABLET TWICE DAILY  . sacubitril-valsartan (ENTRESTO) 24-26 MG Take 1 tablet by mouth 2 (two) times daily.     Allergies:   Patient has no known allergies.   Social History   Socioeconomic History  .  Marital status: Married    Spouse name: Not on file  . Number of children: Not on file  . Years of education: Not on file  . Highest education level: Not on file  Occupational History  . Not on file  Social Needs  . Financial resource strain: Not on file  . Food insecurity    Worry: Not on file    Inability: Not on file  . Transportation needs    Medical: Not on file    Non-medical: Not on file  Tobacco Use  . Smoking status: Never Smoker  . Smokeless tobacco: Never Used  Substance and Sexual Activity  . Alcohol use: No  . Drug use: No  . Sexual activity: Not Currently  Lifestyle  . Physical activity    Days per week: Not on file    Minutes per session: Not on file  . Stress: Not on file  Relationships  . Social Herbalist on phone: Not on file    Gets together:  Not on file    Attends religious service: Not on file    Active member of club or organization: Not on file    Attends meetings of clubs or organizations: Not on file    Relationship status: Not on file  Other Topics Concern  . Not on file  Social History Narrative  . Not on file     Family History: The patient's family history includes Anemia in her sister; Asthma in her mother; Ulcers in her father.  ROS:   Please see the history of present illness.     All other systems reviewed and are negative.  EKGs/Labs/Other Studies Reviewed:    The following studies were reviewed today: Hospital records echocardiogram lab work  ECHO 04/2017: - Left ventricle: The cavity size was normal. Wall thickness was   normal. Systolic function was normal. The estimated ejection   fraction was in the range of 55% to 60%. Wall motion was normal;   there were no regional wall motion abnormalities. Doppler   parameters are consistent with abnormal left ventricular   relaxation (grade 1 diastolic dysfunction). - Aortic valve: Trileaflet; mildly thickened, mildly calcified   leaflets. There was mild regurgitation.  - Mitral valve: There was mild regurgitation. - Left atrium: The atrium was moderately to severely dilated.  Impressions:  - When compared to prior study, EF has improved.   EKG:  EKG is not ordered today.    Recent Labs: 09/21/2018: BNP 362.8 05/16/2019: ALT 7; BUN 26; Creatinine 1.17; Hemoglobin 8.8; Platelets 224; Potassium 4.4; Sodium 142  Recent Lipid Panel    Component Value Date/Time   CHOL 146 10/19/2017 1149   TRIG 111 10/19/2017 1149   HDL 46 10/19/2017 1149   CHOLHDL 3.2 10/19/2017 1149   CHOLHDL 2.8 10/19/2016 1147   VLDL 22 10/19/2016 1147   LDLCALC 78 10/19/2017 1149    Physical Exam:    VS:  BP (!) 144/50   Pulse 77   Ht _0  (1.422 m)   Wt 128 lb (58.1 kg)   SpO2 94%   BMI 28.70 kg/m     Wt Readings from Last 3 Encounters:  05/21/19 128 lb (58.1 kg)  04/18/19 123 lb 11.2 oz (56.1 kg)  01/16/19 123 lb 12.8 oz (56.2 kg)     GEN: Elderly Well nourished, well developed, in no acute distress  HEENT: normal  Neck: no JVD, carotid bruits, or masses Cardiac: RRR; 2/6 systolic murmurs, rubs, or gallops,no edema  Respiratory:  clear to auscultation bilaterally, normal work of breathing GI: soft, nontender, nondistended, + BS MS: no deformity or atrophy  Skin: warm and dry, no rash Neuro:  Alert and Oriented x 3, Strength and sensation are intact Psych: euthymic mood, full affect   ASSESSMENT:    1. NICM (nonischemic cardiomyopathy) ([place])   2. Chronic anemia   3. Pure hypercholesterolemia    PLAN:    In order of problems listed above:  Nonischemic cardiomyopathy -Marked improvement with Entresto, EF now 55 to 60% on echocardiogram 05/18/2017.  Continue with carvedilol Lasix Entresto.  Patient assistance form.  Doing very well.  Mild aortic insufficiency/mild mitral regurgitation -Continue to monitor clinically.  Should be of no clinical significance.  Murmur heard on exam.  Consider repeating echocardiogram in the next year or 2  Diabetes  with hypertension -Hemoglobin A1c 5.8, Actos.  Pioglitazone can cause exacerbation of edema and changing this medication should be considered.  Consider transition to SGLT2 inhibitor such as the dapagliflozin.  Lower extremity edema - Fairly chronic.  Left greater than right.  No major changes.  Amlodipine likely playing a role, perhaps Actos as well.  Medication Adjustments/Labs and Tests Ordered: Current medicines are reviewed at length with the patient today.  Concerns regarding medicines are outlined above.  No orders of the defined types were placed in this encounter.  No orders of the defined types were placed in this encounter.   Patient Instructions  Medication Instructions:  The current medical regimen is effective;  continue present plan and medications.  If you need a refill on your cardiac medications before your next appointment, please call your pharmacy.   Follow-Up: Follow up in 1 year with Dr. Marlou Porch.  You will receive a letter in the mail 2 months before you are due.  Please call us when you receive this letter to schedule your follow up appointment.  Thank you for choosing [facility]!!         Signed, Candee Furbish, MD  05/21/2019 4:13 PM    [facility]

## 2019-06-13 ENCOUNTER — Inpatient Hospital Stay: Payer: Medicare HMO | Attending: Hematology

## 2019-06-13 ENCOUNTER — Inpatient Hospital Stay: Payer: Medicare HMO

## 2019-06-13 ENCOUNTER — Other Ambulatory Visit: Payer: Self-pay

## 2019-06-13 DIAGNOSIS — K922 Gastrointestinal hemorrhage, unspecified: Secondary | ICD-10-CM | POA: Diagnosis not present

## 2019-06-13 DIAGNOSIS — D649 Anemia, unspecified: Secondary | ICD-10-CM

## 2019-06-13 DIAGNOSIS — D500 Iron deficiency anemia secondary to blood loss (chronic): Secondary | ICD-10-CM | POA: Diagnosis not present

## 2019-06-13 LAB — CMP (CANCER CENTER ONLY)
ALT: <6 U/L (ref 0–44)
AST: 21 U/L (ref 15–41)
Albumin: 3.8 g/dL (ref 3.5–5.0)
Alkaline Phosphatase: 84 U/L (ref 38–126)
Anion gap: 10 (ref 5–15)
BUN: 36 mg/dL — ABNORMAL HIGH (ref 8–23)
CO2: 26 mmol/L (ref 22–32)
Calcium: 8.9 mg/dL (ref 8.9–10.3)
Chloride: 108 mmol/L (ref 98–111)
Creatinine: 1.22 mg/dL — ABNORMAL HIGH (ref 0.44–1.00)
GFR, Est AFR Am: 47 mL/min — ABNORMAL LOW (ref >60)
GFR, Estimated: 41 mL/min — ABNORMAL LOW (ref >60)
Glucose, Bld: 132 mg/dL — ABNORMAL HIGH (ref 70–99)
Potassium: 4.1 mmol/L (ref 3.5–5.1)
Sodium: 144 mmol/L (ref 135–145)
Total Bilirubin: 0.4 mg/dL (ref 0.3–1.2)
Total Protein: 6.3 g/dL — ABNORMAL LOW (ref 6.5–8.1)

## 2019-06-13 LAB — SAMPLE TO BLOOD BANK

## 2019-06-13 LAB — CBC WITH DIFFERENTIAL/PLATELET
Abs Immature Granulocytes: 0.03 10*3/uL (ref 0.00–0.07)
Basophils Absolute: 0 10*3/uL (ref 0.0–0.1)
Basophils Relative: 1 %
Eosinophils Absolute: 0.2 10*3/uL (ref 0.0–0.5)
Eosinophils Relative: 4 %
HCT: 29.1 % — ABNORMAL LOW (ref 36.0–46.0)
Hemoglobin: 9 g/dL — ABNORMAL LOW (ref 12.0–15.0)
Immature Granulocytes: 1 %
Lymphocytes Relative: 6 %
Lymphs Abs: 0.3 10*3/uL — ABNORMAL LOW (ref 0.7–4.0)
MCH: 31.7 pg (ref 26.0–34.0)
MCHC: 30.9 g/dL (ref 30.0–36.0)
MCV: 102.5 fL — ABNORMAL HIGH (ref 80.0–100.0)
Monocytes Absolute: 0.6 10*3/uL (ref 0.1–1.0)
Monocytes Relative: 10 %
Neutro Abs: 4.2 10*3/uL (ref 1.7–7.7)
Neutrophils Relative %: 78 %
Platelets: 218 10*3/uL (ref 150–400)
RBC: 2.84 MIL/uL — ABNORMAL LOW (ref 3.87–5.11)
RDW: 15.1 % (ref 11.5–15.5)
WBC: 5.4 10*3/uL (ref 4.0–10.5)
nRBC: 0 % (ref 0.0–0.2)

## 2019-06-13 LAB — FERRITIN: Ferritin: 968 ng/mL — ABNORMAL HIGH (ref 11–307)

## 2019-06-13 LAB — IRON AND TIBC
Iron: 61 ug/dL (ref 41–142)
Saturation Ratios: 28 % (ref 21–57)
TIBC: 216 ug/dL — ABNORMAL LOW (ref 236–444)
UIBC: 155 ug/dL (ref 120–384)

## 2019-06-15 ENCOUNTER — Other Ambulatory Visit: Payer: Self-pay | Admitting: Family Medicine

## 2019-06-18 ENCOUNTER — Other Ambulatory Visit: Payer: Self-pay

## 2019-06-18 ENCOUNTER — Other Ambulatory Visit: Payer: Medicare HMO

## 2019-06-18 DIAGNOSIS — Z23 Encounter for immunization: Secondary | ICD-10-CM

## 2019-06-18 DIAGNOSIS — M810 Age-related osteoporosis without current pathological fracture: Secondary | ICD-10-CM | POA: Diagnosis not present

## 2019-06-18 MED ORDER — DENOSUMAB 60 MG/ML ~~LOC~~ SOSY
60.0000 mg | PREFILLED_SYRINGE | Freq: Once | SUBCUTANEOUS | Status: AC
  Administered 2019-06-18: 16:00:00 60 mg via SUBCUTANEOUS

## 2019-07-04 NOTE — Progress Notes (Signed)
Connie [name]    HEMATOLOGY/ONCOLOGY CLINIC NOTE  Date of Service: 07/11/19    Patient Care Team: Connie Lung, MD as PCP - General (Family Medicine) Connie Pain, MD as PCP - Cardiology (Cardiology)  CHIEF COMPLAINTS/PURPOSE OF CONSULTATION:  Iron deficiency anemia due to GI bleeding from multiple AVMs  DIAGNOSIS: Iron deficiency anemia likely related to ongoing chronic GI bleeding from multiple AVMs  TREATMENT -IV iron replacement with injectafer as needed (tolerated well). (Had some shortness of breath with IV Feraheme - which was likely fluid overload as opposed to a true allergic reaction but patient has been hesitant to take this)  HISTORY OF PRESENTING ILLNESS:  Please see previous note for details  INTERVAL HISTORY  Connie [name] is a 85 y.o. female here for followup for her IDA. The patient's last visit with [place] was on 04/18/2019. The pt reports that she is doing well overall.  The pt reports that she has been well in the interim. She denies any recent hospital visits or medication changes. Pt has been having chronic left leg swelling. She states that her leg is not painful or red and is better in the morning.  Pt has continued to walk to help maintain her mobility. Pt's stools have been black and she has not been taking any po iron. She has an upcoming appointment with her PCP Connie [name].    Lab results today (07/11/19) of CBC w/diff and CMP is as follows: all values are WNL except for RBC at 2.79, Hgb at 8.7, HCT at 28.0, MCV at 100.4, Lymphs Abs at 0.5K. 07/11/2019 Ferritin at 799 07/11/2019 Iron and TIBC is as follows: Iron at 37, TIBC at 215, Sat Ratios at 17, UIBC at 178  On review of systems, pt reports left leg swelling, black stools and denies leg [name]/redness, SOB, chest [name], abdominal [name] and any other symptoms.   MEDICAL HISTORY:  Past Medical History:  Diagnosis Date  . Allergy    RHINITIS  . Angiodysplasia of duodenum    hx/notes 11/21/2015  . Angiodysplasia  of stomach    hx/notes 11/21/2015  . Arthritis    "joints ache"  . Bleeding gastric ulcer    hx/notes 11/21/2015  . Chronic blood loss anemia    secondary to angiodysplasia of the stomach and duodenum as well as history of bleeding gastric ulcer hx/notes 11/21/2015  . Chronic systolic CHF (congestive heart failure) ([place]) 12/23/2015  . History of blood transfusion 01/2015; 06/2015; 11/21/2015  . Hypercholesteremia   . Hypertension   . Hypothyroidism   . Obesity   . Pneumonia "several times"  . Type II diabetes mellitus ([place])     SURGICAL HISTORY: Past Surgical History:  Procedure Laterality Date  . BIOPSY  07/24/2018   Procedure: BIOPSY;  Surgeon: Carol Ada, MD;  Location: [facility] ENDOSCOPY;  Service: Endoscopy;;  . CARDIAC CATHETERIZATION N/A 12/19/2015   Procedure: Left Heart Cath and Coronary Angiography;  Surgeon: Leonie Man, MD;  Location: [place] [facility];  Service: Cardiovascular;  Laterality: N/A;  . COLONOSCOPY N/A 08/30/2014   Procedure: COLONOSCOPY;  Surgeon: Beryle Beams, MD;  Location: [facility] ENDOS[facility];  Service: Endoscopy;  Laterality: N/A;  . COLONOSCOPY N/A 02/21/2015   Procedure: COLONOSCOPY;  Surgeon: Carol Ada, MD;  Location[facility] ENDOSCOPY;  Service: Endoscopy;  Laterality: N/A;  . COLONOSCOPY WITH PROPOFOL N/A 07/24/2018   Procedure: COLONOSCOPY WITH PROPOFOL;  Surgeon: Carol Ada, MD;  Location: [facility];  Service: Endoscopy;  Laterality: N/A;  . ENTEROSCOPY N/A  06/06/2015   Procedure: ENTEROSCOPY;  Surgeon: Carol Ada, MD;  Location: [facility];  Service: Endoscopy;  Laterality: N/A;  . ENTEROSCOPY N/A 07/24/2018   Procedure: ENTEROSCOPY;  Surgeon: Carol Ada, MD;  Location: [facility];  Service: Endoscopy;  Laterality: N/A;  . FRACTURE SURGERY    . GIVENS CAPSULE STUDY N/A 05/15/2015   Procedure: GIVENS CAPSULE STUDY;  Surgeon: Carol Ada, MD;  Location: [facility] ENDOSCOPY;  Service: Endoscopy;  Laterality: N/A;  . HOT HEMOSTASIS N/A 08/30/2014   Procedure:  HOT HEMOSTASIS (ARGON PLASMA COAGULATION/BICAP);  Surgeon: Beryle Beams, MD;  Location: [facility];  Service: Endoscopy;  Laterality: N/A;  . HOT HEMOSTASIS N/A 06/06/2015   Procedure: HOT HEMOSTASIS (ARGON PLASMA COAGULATION/BICAP);  Surgeon: Carol Ada, MD;  Location: [facility] ENDOSCOPY;  Service: Endoscopy;  Laterality: N/A;  . HOT HEMOSTASIS N/A 07/24/2018   Procedure: HOT HEMOSTASIS (ARGON PLASMA COAGULATION/BICAP);  Surgeon: Carol Ada, MD;  Location: [facility] ENDOSCOPY;  Service: Endoscopy;  Laterality: N/A;  . JOINT REPLACEMENT    . REVISION TOTAL KNEE ARTHROPLASTY Bilateral 2004-2015  . SHOULDER OPEN ROTATOR CUFF REPAIR Right 12/2004   open subacromial decompression, distal clavicle  resection, rotator cuff repair/notes 02/02/2011  . TONSILLECTOMY  1940s  . TOTAL KNEE ARTHROPLASTY Bilateral ~ 2002  . TOTAL THYROIDECTOMY  ~ 1966    SOCIAL HISTORY: Social History   Socioeconomic History  . Marital status: Married    Spouse name: Not on file  . Number of children: Not on file  . Years of education: Not on file  . Highest education level: Not on file  Occupational History  . Not on file  Social Needs  . Financial resource strain: Not on file  . Food insecurity    Worry: Not on file    Inability: Not on file  . Transportation needs    Medical: Not on file    Non-medical: Not on file  Tobacco Use  . Smoking status: Never Smoker  . Smokeless tobacco: Never Used  Substance and Sexual Activity  . Alcohol use: No  . Drug use: No  . Sexual activity: Not Currently  Lifestyle  . Physical activity    Days per week: Not on file    Minutes per session: Not on file  . Stress: Not on file  Relationships  . Social Herbalist on phone: Not on file    Gets together: Not on file    Attends religious service: Not on file    Active member of club or organization: Not on file    Attends meetings of clubs or organizations: Not on file    Relationship status: Not on file  .  Intimate partner violence    Fear of current or ex partner: Not on file    Emotionally abused: Not on file    Physically abused: Not on file    Forced sexual activity: Not on file  Other Topics Concern  . Not on file  Social History Narrative  . Not on file    FAMILY HISTORY: Family History  Problem Relation Age of Onset  . Asthma Mother   . Ulcers Father   . Anemia Sister     ALLERGIES:  has No Known Allergies.  MEDICATIONS:  Current Outpatient Medications  Medication Sig Dispense Refill  . ACCU-CHEK FASTCLIX LANCETS MISC 1 each by Does not apply route 2 (two) times daily. DX E11.9 204 each 3  . acetaminophen (TYLENOL) 500 MG tablet Take 500 mg by mouth every  6 (six) hours as needed for mild [name] or headache.     Connie [name] amLODipine (NORVASC) 10 MG tablet TAKE 1 TABLET EVERY DAY 90 tablet 1  . atorvastatin (LIPITOR) 40 MG tablet TAKE 1 TABLET EVERY DAY 90 tablet 0  . Blood Glucose Monitoring Suppl (ACCU-CHEK GUIDE) w/Device KIT 1 each by Does not apply route 2 (two) times daily. DX E11.9 1 kit 0  . Calcium Carb-Cholecalciferol (CALCIUM 1000 + D PO) Take 1 tablet by mouth.    . carvedilol (COREG) 12.5 MG tablet TAKE 1 TABLET (12.5 MG TOTAL) BY MOUTH 2 (TWO) TIMES DAILY WITH A MEAL. 180 tablet 1  . furosemide (LASIX) 40 MG tablet TAKE 1 TABLET EVERY DAY 90 tablet 0  . glucose blood (ACCU-CHEK GUIDE) test strip Patient to test 2 times daily DX E11.9 Use as instructed 200 each 3  . Lancets Misc. (ACCU-CHEK FASTCLIX LANCET) KIT 1 each by Does not apply route 2 (two) times daily. DX E11.9 1 kit 0  . levothyroxine (SYNTHROID) 125 MCG tablet TAKE 1 TABLET EVERY DAY 90 tablet 3  . Multiple Vitamins-Minerals (WOMENS MULTIVITAMIN PLUS PO) Take 1 tablet by mouth daily.    . pantoprazole (PROTONIX) 20 MG tablet TAKE 1 TABLET BY MOUTH DAILY. 90 tablet 3  . pioglitazone (ACTOS) 30 MG tablet TAKE 1 TABLET EVERY DAY 90 tablet 0  . potassium chloride (K-DUR) 10 MEQ tablet TAKE 1 TABLET TWICE DAILY 180  tablet 1  . sacubitril-valsartan (ENTRESTO) 24-26 MG Take 1 tablet by mouth 2 (two) times daily. 180 tablet 3   No current facility-administered medications for this visit.     REVIEW OF SYSTEMS:    A 10+ POINT REVIEW OF SYSTEMS WAS OBTAINED including neurology, dermatology, psychiatry, cardiac, respiratory, lymph, extremities, GI, GU, Musculoskeletal, constitutional, breasts, reproductive, HEENT.  All pertinent positives are noted in the HPI.  All others are negative.   PHYSICAL EXAMINATION:  ECOG PERFORMANCE STATUS: 1 - Symptomatic but completely ambulatory  Vitals:   07/11/19 0900  BP: (!) 163/62  Pulse: 75  Resp: 18  Temp: 98.7 F (37.1 C)  SpO2: 98%   Filed Weights   07/11/19 0900  Weight: 133 lb 1.6 oz (60.4 kg)   .Body mass index is 29.84 kg/m.  Exam given in a chair   GENERAL:alert, in no acute distress and comfortable SKIN: no acute rashes, no significant lesions EYES: conjunctiva are pink and non-injected, sclera anicteric OROPHARYNX: MMM, no exudates, no oropharyngeal erythema or ulceration NECK: supple, no JVD LYMPH:  no palpable lymphadenopathy in the cervical, axillary or inguinal regions LUNGS: clear to auscultation b/l with normal respiratory effort HEART: regular rate & rhythm ABDOMEN:  normoactive bowel sounds , non tender, not distended. No palpable hepatosplenomegaly. Extremity: 1-2+ pedal edema, worse in the left ankle than right PSYCH: alert & oriented x 3 with fluent speech NEURO: no focal motor/sensory deficits  LABORATORY DATA:    CBC Latest Ref Rng & Units 07/11/2019 06/13/2019 05/16/2019  WBC 4.0 - 10.5 K/uL 5.6 5.4 5.5  Hemoglobin 12.0 - 15.0 g/dL 8.7(L) 9.0(L) 8.8(L)  Hematocrit 36.0 - 46.0 % 28.0(L) 29.1(L) 28.5(L)  Platelets 150 - 400 K/uL 212 218 224    . CBC    Component Value Date/Time   WBC 5.6 07/11/2019 0816   RBC 2.79 (L) 07/11/2019 0816   HGB 8.7 (L) 07/11/2019 0816   HGB 8.9 (L) 08/01/2018 1217   HGB 10.8 (L)  06/20/2017 1326   HCT 28.0 (L) 07/11/2019 8676  HCT 27.3 (L) 08/01/2018 1217   HCT 34.9 06/20/2017 1326   PLT 212 07/11/2019 0816   PLT 307 08/01/2018 1217   MCV 100.4 (H) 07/11/2019 0816   MCV 92 08/01/2018 1217   MCV 94.8 06/20/2017 1326   MCH 31.2 07/11/2019 0816   MCHC 31.1 07/11/2019 0816   RDW 14.0 07/11/2019 0816   RDW 14.7 08/01/2018 1217   RDW 14.2 06/20/2017 1326   LYMPHSABS 0.5 (L) 07/11/2019 0816   LYMPHSABS 0.5 (L) 08/01/2018 1217   LYMPHSABS 0.6 (L) 06/20/2017 1326   MONOABS 0.7 07/11/2019 0816   MONOABS 0.7 06/20/2017 1326   EOSABS 0.2 07/11/2019 0816   EOSABS 0.2 08/01/2018 1217   BASOSABS 0.0 07/11/2019 0816   BASOSABS 0.1 08/01/2018 1217   BASOSABS 0.0 06/20/2017 1326    . CMP Latest Ref Rng & Units 06/13/2019 05/16/2019 04/18/2019  Glucose 70 - 99 mg/dL 132(H) 114(H) 110(H)  BUN 8 - 23 mg/dL 36(H) 26(H) 26(H)  Creatinine 0.44 - 1.00 mg/dL 1.22(H) 1.17(H) 0.98  Sodium 135 - 145 mmol/L 144 142 143  Potassium 3.5 - 5.1 mmol/L 4.1 4.4 4.3  Chloride 98 - 111 mmol/L 108 107 111  CO2 22 - 32 mmol/L _0 Calcium 8.9 - 10.3 mg/dL 8.9 8.6(L) 8.7(L)  Total Protein 6.5 - 8.1 g/dL 6.3(L) 6.3(L) 6.4(L)  Total Bilirubin 0.3 - 1.2 mg/dL 0.4 0.6 0.4  Alkaline Phos 38 - 126 U/L 84 102 119  AST 15 - 41 U/L _1 ALT 0 - 44 U/L <_2 . Lab Results  Component Value Date   IRON 37 (L) 07/11/2019   TIBC 215 (L) 07/11/2019   IRONPCTSAT 17 (L) 07/11/2019   (Iron and TIBC)  Lab Results  Component Value Date   FERRITIN 799 (H) 07/11/2019    . Lab Results  Component Value Date   IRON 37 (L) 07/11/2019   TIBC 215 (L) 07/11/2019   IRONPCTSAT 17 (L) 07/11/2019   (Iron and TIBC)  Lab Results  Component Value Date   FERRITIN 799 (H) 07/11/2019      ASSESSMENT & PLAN:   85 y.o. Caucasian female with  #1 Microcytic anemia likely related to iron deficiency from chronic GI bleeding. No known disorder causing chronic inflammation at this time.  #2  history of recurrent GI bleeding related to upper and lower GI tract AVMs and gastric erosions. Has had several admissions with requirement of multiple PRBC transfusions. Dropping ferritin suggests ongoing slow GI losses. No clinically overt GI bleeding noted. Last colonoscopy from 07/24/18 revealed two non-bleeding angiodysplastic lesions which were treated with a monopolar probe  Last small bowel endoscopy from 07/24/18 revealed gastritis with hemorrhage, negative for H.pylori  #3 Hypothyroidism on levothyroxine .  #4 ?? Reaction to [facility] - current hospital admission that shortness of breath appears to be more likely related to CHF decompensation than allergic reaction to Feraheme. However patient is very anxious and hesitant to get this preparation. Switched to and tolerated Injectafer  PLAN:  -Discussed pt labwork today, 07/11/19; all values are WNL except for RBC at 2.79, Hgb at 8.7, HCT at 28.0, MCV at 100.4, Lymphs Abs at 0.5K. -Discussed 07/11/2019 Ferritin at 799 -Discussed 07/11/2019 Iron and TIBC is as follows: Iron at 37, TIBC at 215, Sat Ratios at 17, UIBC at 178 -Discussed that the patient's iron levels have been trending downwards based on last Ferritin -continue to hold PO iron as this makes it difficult to differentiate  the cause of her dark stool color, and we are replacing Iron IV as needed -Continue IV iron as needed  -Will give blood transfusions as needed  -No indication for a blood transfusion today -Recommended pt use compression socks -Pt will contact if any new issues or concerns with her leg swelling -If leg swelling increases or becomes painful may consider repeat Lower Extremity US -Advised pt that if black stools continue may need to contact GI -Continue Vitamin B complex -Absolutely no NSAIDs, only Tylenol for mild [name]   -Will monitor with monthly labs -Will see the pt back in 3 months with labs   FOLLOW UP: Labs and 1unit of PRBC q4weeks x 4  IV  Injectafer q8 weeks starting in 4 weeks RTC with Dr [name] in 12 weeks  The total time spent in the appt was 15 minutes and more than 50% was on counseling and direct patient cares.  All of the patient's questions were answered with apparent satisfaction. The patient knows to call the clinic with any problems, questions or concerns.   Sullivan Lone MD [place] [facility] Hematology/Oncology Physician [facility]  (Office):       [phone] (Work cell):  [phone] (Fax):           [phone]  I, Yevette Edwards, am acting as a scribe for Dr. Sullivan Lone.   .I have reviewed the above documentation for accuracy and completeness, and I agree with the above. Brunetta Genera MD

## 2019-07-11 ENCOUNTER — Inpatient Hospital Stay: Payer: Medicare HMO

## 2019-07-11 ENCOUNTER — Other Ambulatory Visit: Payer: Self-pay

## 2019-07-11 ENCOUNTER — Inpatient Hospital Stay (HOSPITAL_BASED_OUTPATIENT_CLINIC_OR_DEPARTMENT_OTHER): Payer: Medicare HMO | Admitting: Hematology

## 2019-07-11 ENCOUNTER — Inpatient Hospital Stay: Payer: Medicare HMO | Attending: Hematology

## 2019-07-11 VITALS — BP 163/62 | HR 75 | Temp 98.7°F | Resp 18 | Ht <= 58 in | Wt 133.1 lb

## 2019-07-11 DIAGNOSIS — E119 Type 2 diabetes mellitus without complications: Secondary | ICD-10-CM | POA: Diagnosis not present

## 2019-07-11 DIAGNOSIS — I110 Hypertensive heart disease with heart failure: Secondary | ICD-10-CM | POA: Insufficient documentation

## 2019-07-11 DIAGNOSIS — I5022 Chronic systolic (congestive) heart failure: Secondary | ICD-10-CM | POA: Diagnosis not present

## 2019-07-11 DIAGNOSIS — E7800 Pure hypercholesterolemia, unspecified: Secondary | ICD-10-CM | POA: Insufficient documentation

## 2019-07-11 DIAGNOSIS — K31811 Angiodysplasia of stomach and duodenum with bleeding: Secondary | ICD-10-CM

## 2019-07-11 DIAGNOSIS — D500 Iron deficiency anemia secondary to blood loss (chronic): Secondary | ICD-10-CM

## 2019-07-11 DIAGNOSIS — K922 Gastrointestinal hemorrhage, unspecified: Secondary | ICD-10-CM | POA: Diagnosis not present

## 2019-07-11 DIAGNOSIS — Z7984 Long term (current) use of oral hypoglycemic drugs: Secondary | ICD-10-CM | POA: Insufficient documentation

## 2019-07-11 DIAGNOSIS — E039 Hypothyroidism, unspecified: Secondary | ICD-10-CM | POA: Diagnosis not present

## 2019-07-11 DIAGNOSIS — D649 Anemia, unspecified: Secondary | ICD-10-CM

## 2019-07-11 DIAGNOSIS — Z79899 Other long term (current) drug therapy: Secondary | ICD-10-CM | POA: Insufficient documentation

## 2019-07-11 LAB — CBC WITH DIFFERENTIAL/PLATELET
Abs Immature Granulocytes: 0.01 10*3/uL (ref 0.00–0.07)
Basophils Absolute: 0 10*3/uL (ref 0.0–0.1)
Basophils Relative: 1 %
Eosinophils Absolute: 0.2 10*3/uL (ref 0.0–0.5)
Eosinophils Relative: 3 %
HCT: 28 % — ABNORMAL LOW (ref 36.0–46.0)
Hemoglobin: 8.7 g/dL — ABNORMAL LOW (ref 12.0–15.0)
Immature Granulocytes: 0 %
Lymphocytes Relative: 8 %
Lymphs Abs: 0.5 10*3/uL — ABNORMAL LOW (ref 0.7–4.0)
MCH: 31.2 pg (ref 26.0–34.0)
MCHC: 31.1 g/dL (ref 30.0–36.0)
MCV: 100.4 fL — ABNORMAL HIGH (ref 80.0–100.0)
Monocytes Absolute: 0.7 10*3/uL (ref 0.1–1.0)
Monocytes Relative: 13 %
Neutro Abs: 4.2 10*3/uL (ref 1.7–7.7)
Neutrophils Relative %: 75 %
Platelets: 212 10*3/uL (ref 150–400)
RBC: 2.79 MIL/uL — ABNORMAL LOW (ref 3.87–5.11)
RDW: 14 % (ref 11.5–15.5)
WBC: 5.6 10*3/uL (ref 4.0–10.5)
nRBC: 0 % (ref 0.0–0.2)

## 2019-07-11 LAB — IRON AND TIBC
Iron: 37 ug/dL — ABNORMAL LOW (ref 41–142)
Saturation Ratios: 17 % — ABNORMAL LOW (ref 21–57)
TIBC: 215 ug/dL — ABNORMAL LOW (ref 236–444)
UIBC: 178 ug/dL (ref 120–384)

## 2019-07-11 LAB — SAMPLE TO BLOOD BANK

## 2019-07-11 LAB — FERRITIN: Ferritin: 799 ng/mL — ABNORMAL HIGH (ref 11–307)

## 2019-07-12 ENCOUNTER — Telehealth: Payer: Self-pay | Admitting: Hematology

## 2019-07-12 NOTE — Telephone Encounter (Signed)
Scheduled appt per 10/21 los.  Patient will get a print out of the updated calendar at her next appt.

## 2019-07-16 ENCOUNTER — Other Ambulatory Visit: Payer: Self-pay | Admitting: Family Medicine

## 2019-07-18 ENCOUNTER — Telehealth: Payer: Self-pay | Admitting: Family Medicine

## 2019-07-18 NOTE — Telephone Encounter (Signed)
I called son back & explained this and gave recommendations he seemed to be ok with them and I advised to call back for virtual or if any worsening sx or go to Urgent Care or ER

## 2019-07-18 NOTE — Telephone Encounter (Signed)
She can take Mucinex DM over-the-counter or Benadryl over-the-counter the next few days assuming she is not allergic to this.  It would also be reasonable to go do a Covid test such as the [facility] testing site.  The other option would be to have eval at urgent care or virtual consult.  Yes please make them aware that any respiratory symptoms could potentially be Covid related and that is why we are not bringing people into the office but screening first on the phone listen information before we discussed next steps.  This way we limit the spread of potentially Covid and other illnesses to other people as well as our staff and other patients.

## 2019-07-18 NOTE — Telephone Encounter (Signed)
pts son called and states that Kennyth Lose has a cough, and her nose is running and is not for sure if she is running a fever, states that is what the caretaker told him as he is not there with her, he was wanting to know what he could give her, I offered her a virtual office visit and he said she could not to do that, and I told him we could not bring her into the office as she had sick symptoms and he was not happy with that, pt son did not see how we could diagnose people over the phone, pt son can be reached at [phone], her care taker tried to take her to a urgent care and she would not go please advise, informed pt son you where  not in the office today,

## 2019-07-22 ENCOUNTER — Other Ambulatory Visit: Payer: Self-pay | Admitting: Cardiology

## 2019-07-25 ENCOUNTER — Other Ambulatory Visit: Payer: Self-pay

## 2019-07-25 ENCOUNTER — Encounter: Payer: Self-pay | Admitting: Medical

## 2019-07-25 ENCOUNTER — Ambulatory Visit (INDEPENDENT_AMBULATORY_CARE_PROVIDER_SITE_OTHER): Payer: Medicare HMO | Admitting: Medical

## 2019-07-25 VITALS — BP 140/68 | HR 74 | Temp 98.0°F | Ht <= 58 in | Wt 139.4 lb

## 2019-07-25 DIAGNOSIS — K31811 Angiodysplasia of stomach and duodenum with bleeding: Secondary | ICD-10-CM | POA: Diagnosis not present

## 2019-07-25 DIAGNOSIS — R062 Wheezing: Secondary | ICD-10-CM | POA: Diagnosis not present

## 2019-07-25 DIAGNOSIS — I5022 Chronic systolic (congestive) heart failure: Secondary | ICD-10-CM | POA: Diagnosis not present

## 2019-07-25 DIAGNOSIS — I428 Other cardiomyopathies: Secondary | ICD-10-CM | POA: Diagnosis not present

## 2019-07-25 DIAGNOSIS — H6690 Otitis media, unspecified, unspecified ear: Secondary | ICD-10-CM | POA: Insufficient documentation

## 2019-07-25 DIAGNOSIS — E1159 Type 2 diabetes mellitus with other circulatory complications: Secondary | ICD-10-CM

## 2019-07-25 DIAGNOSIS — R609 Edema, unspecified: Secondary | ICD-10-CM

## 2019-07-25 DIAGNOSIS — I10 Essential (primary) hypertension: Secondary | ICD-10-CM

## 2019-07-25 DIAGNOSIS — D500 Iron deficiency anemia secondary to blood loss (chronic): Secondary | ICD-10-CM | POA: Diagnosis not present

## 2019-07-25 DIAGNOSIS — I152 Hypertension secondary to endocrine disorders: Secondary | ICD-10-CM

## 2019-07-25 DIAGNOSIS — E039 Hypothyroidism, unspecified: Secondary | ICD-10-CM

## 2019-07-25 MED ORDER — AMOXICILLIN 875 MG PO TABS: 875.0000 mg | ORAL_TABLET | Freq: Two times a day (BID) | ORAL | 0 refills | Status: DC

## 2019-07-25 MED ORDER — SPIRONOLACTONE 25 MG PO TABS: 25.0000 mg | ORAL_TABLET | Freq: Every day | ORAL | 0 refills | Status: DC

## 2019-07-25 NOTE — Patient Instructions (Signed)
Encounter Diagnoses  Name Primary?  . Edema, unspecified type Yes  . Chronic systolic CHF (congestive heart failure) ([place])   . Wheezing   . Iron deficiency anemia due to chronic blood loss   . Hypertension associated with diabetes ([place])   . Angiodysplasia of stomach and duodenum with hemorrhage   . NICM (nonischemic cardiomyopathy) ([place])   . Hypothyroidism, unspecified type   . Acute otitis media, unspecified otitis media type    Recommendations: Begin amoxicillin antibiotic for ear infection  Begin spironolactone fluid pill for excess fluid in the legs.  You have gained about 10 pounds in the last month or so which is probably causing the wheezing in changes in symptoms.  We will check labs today.   There could be several causes for the fluid in increase in weight including low protein in the serum, history of heart failure, kidney disease, possibly excess salt intake or excess fluid intake.  Try to limit your salt intake to less than 1.5 g/day, try to keep your fluid intake consistent such as 1.5 L/day  You will need to follow-up with either Dr. Redmond School day or cardiology in a week

## 2019-07-25 NOTE — Progress Notes (Signed)
Subjective:  Connie [name] is a 85 y.o. female who presents for Chief Complaint  Patient presents with  . Leg Swelling  . Ear Pain    right   . Wheezing     Here today for concerns.  She reports some wheezing in the last few days.  No chest pain, no jaw pain, nausea no sweats.  She also notes ear pain on the right in the last 2 days.  She does note a history of ear infection at this time a year.  Has a little drainage on the right side.  No left ear issue.  No cough no runny nose no sneezing, no sore throat no congestion.  No sick contacts.  No Covid contacts.  She was surprised to see her weight on the scale today, 10 pound weight gain over recent.  She is compliant with her medications including Actos for diabetes and Lasix daily.  She notes that she has been on the same Lasix for a while and does not recall prior changes in her fluid pill.  She does not check her sugars regularly.  She lives alone.  But her children check on her regularly.  She has chronic leg swelling but it has been worse of lately particularly on the left.  No calf pain.  No recent fall or injury.  No recent long travel.  No other aggravating or relieving factors.    No other c/o.  The following portions of the patient's history were reviewed and updated as appropriate: allergies, current medications, past family history, past medical history, past social history, past surgical history and problem list.  ROS Otherwise as in subjective above  Past Medical History:  Diagnosis Date  . Allergy    RHINITIS  . Angiodysplasia of duodenum    hx/notes 11/21/2015  . Angiodysplasia of stomach    hx/notes 11/21/2015  . Arthritis    "joints ache"  . Bleeding gastric ulcer    hx/notes 11/21/2015  . Chronic blood loss anemia    secondary to angiodysplasia of the stomach and duodenum as well as history of bleeding gastric ulcer hx/notes 11/21/2015  . Chronic systolic CHF (congestive heart failure) ([place]) 12/23/2015   . History of blood transfusion 01/2015; 06/2015; 11/21/2015  . Hypercholesteremia   . Hypertension   . Hypothyroidism   . Obesity   . Pneumonia "several times"  . Type II diabetes mellitus ([place])    Current Outpatient Medications on File Prior to Visit  Medication Sig Dispense Refill  . ACCU-CHEK FASTCLIX LANCETS MISC 1 each by Does not apply route 2 (two) times daily. DX E11.9 204 each 3  . acetaminophen (TYLENOL) 500 MG tablet Take 500 mg by mouth every 6 (six) hours as needed for mild pain or headache.     Marland Kitchen amLODipine (NORVASC) 10 MG tablet TAKE 1 TABLET EVERY DAY 90 tablet 1  . atorvastatin (LIPITOR) 40 MG tablet TAKE 1 TABLET EVERY DAY 90 tablet 0  . Blood Glucose Monitoring Suppl (ACCU-CHEK GUIDE) w/Device KIT 1 each by Does not apply route 2 (two) times daily. DX E11.9 1 kit 0  . Calcium Carb-Cholecalciferol (CALCIUM 1000 + D PO) Take 1 tablet by mouth.    . carvedilol (COREG) 12.5 MG tablet TAKE 1 TABLET BY MOUTH 2 TIMES DAILY WITH A MEAL. 180 tablet 2  . furosemide (LASIX) 40 MG tablet TAKE 1 TABLET EVERY DAY 90 tablet 0  . glucose blood (ACCU-CHEK GUIDE) test strip Patient to test 2 times daily DX  E11.9 Use as instructed 200 each 3  . Lancets Misc. (ACCU-CHEK FASTCLIX LANCET) KIT 1 each by Does not apply route 2 (two) times daily. DX E11.9 1 kit 0  . levothyroxine (SYNTHROID) 125 MCG tablet TAKE 1 TABLET EVERY DAY 90 tablet 3  . Multiple Vitamins-Minerals (WOMENS MULTIVITAMIN PLUS PO) Take 1 tablet by mouth daily.    . pantoprazole (PROTONIX) 20 MG tablet TAKE 1 TABLET BY MOUTH DAILY. 90 tablet 3  . pioglitazone (ACTOS) 30 MG tablet TAKE 1 TABLET EVERY DAY 90 tablet 0  . potassium chloride (K-DUR) 10 MEQ tablet TAKE 1 TABLET TWICE DAILY 180 tablet 1  . sacubitril-valsartan (ENTRESTO) 24-26 MG Take 1 tablet by mouth 2 (two) times daily. 180 tablet 3   No current facility-administered medications on file prior to visit.      Objective: BP 140/68   Pulse 74   Temp 98 F (36.7  C)   Ht '4\' 8"'  (1.422 m)   Wt 139 lb 6.4 oz (63.2 kg)   SpO2 97%   BMI 31.25 kg/m   General appearance: alert, no distress, well developed, well nourished HEENT: normocephalic, sclerae anicteric, conjunctiva pink and moist, right ear canal with some cloudy drainage, left ear canal clear and TM normal, nares patent, no discharge or erythema, pharynx normal Oral cavity: MMM, no lesions Neck: supple, no lymphadenopathy, no thyromegaly, no masses Heart: RRR, normal S1, S2, no murmurs Lungs: Somewhat decreased breath sounds but no obvious wheezes, rhonchi, or rales Abdomen: +bs, soft, non tender, non distended, no masses, no hepatomegaly, no splenomegaly Pulses: 1+ pulses upper and lower extremity, normal cap refill Ext: 2+ lower extremity pitting edema bilateral, no obvious asymmetry of lower legs, nontender calves, negative Homans   EKG No acute change from 09/2018 EKG    Assessment: Encounter Diagnoses  Name Primary?  . Edema, unspecified type Yes  . Chronic systolic CHF (congestive heart failure) ([place])   . Wheezing   . Iron deficiency anemia due to chronic blood loss   . Hypertension associated with diabetes ([place])   . Angiodysplasia of stomach and duodenum with hemorrhage   . NICM (nonischemic cardiomyopathy) ([place])   . Hypothyroidism, unspecified type   . Acute otitis media, unspecified otitis media type      Plan: She normally sees Dr. Redmond School day here for PCP.  I have not seen her in quite a while.    I reviewed recent office notes in her chart record from oncology and cardiology as well as her PCP here.  She apparently has had about a 10 pound weight gain since August, presumably within the last few weeks.  I reviewed recent oncology notes and cardiology notes.  It would appear that her lower extremity edema has taken place within the last month as there was not edema noted in the August cardiology note but was noted in the October oncology note.  Her wheezing symptoms are  new as well.  She has prior evidence on labs of low protein, history of iron deficiency anemia, history of congestive heart failure that is chronic.  We will check some labs today as below.  Counseled on daily weights, limiting salt, being consistent water intake.  I will have her add a second diuretic, spironolactone.  Begin amox for OM   Patient Instructions   Encounter Diagnoses  Name Primary?  . Edema, unspecified type Yes  . Chronic systolic CHF (congestive heart failure) ([place])   . Wheezing   . Iron deficiency anemia due to  chronic blood loss   . Hypertension associated with diabetes ([place])   . Angiodysplasia of stomach and duodenum with hemorrhage   . NICM (nonischemic cardiomyopathy) ([place])   . Hypothyroidism, unspecified type   . Acute otitis media, unspecified otitis media type    Recommendations: Begin amoxicillin antibiotic for ear infection  Begin spironolactone fluid pill for excess fluid in the legs.  You have gained about 10 pounds in the last month or so which is probably causing the wheezing in changes in symptoms.  We will check labs today.   There could be several causes for the fluid in increase in weight including low protein in the serum, history of heart failure, kidney disease, possibly excess salt intake or excess fluid intake.  Try to limit your salt intake to less than 1.5 g/day, try to keep your fluid intake consistent such as 1.5 L/day  You will need to follow-up with either Dr. Redmond School day or cardiology in a week     Janese was seen today for leg swelling, ear pain and wheezing.  Diagnoses and all orders for this visit:  Edema, unspecified type -     EKG 12-Lead -     Brain natriuretic peptide -     Basic Metabolic Panel -     CBC  Chronic systolic CHF (congestive heart failure) ([name]) -     EKG 12-Lead -     Brain natriuretic peptide -     Basic Metabolic Panel -     CBC  Wheezing -     EKG 12-Lead -     Brain natriuretic  peptide -     Basic Metabolic Panel -     CBC  Iron deficiency anemia due to chronic blood loss -     CBC  Hypertension associated with diabetes ([name])  Angiodysplasia of stomach and duodenum with hemorrhage  NICM (nonischemic cardiomyopathy) ([name])  Hypothyroidism, unspecified type  Acute otitis media, unspecified otitis media type  Other orders -     spironolactone (ALDACTONE) 25 MG tablet; Take 1 tablet (25 mg total) by mouth daily. -     amoxicillin (AMOXIL) 875 MG tablet; Take 1 tablet (875 mg total) by mouth 2 (two) times daily.    Follow up: pending labs

## 2019-07-26 ENCOUNTER — Other Ambulatory Visit: Payer: Self-pay | Admitting: Family Medicine

## 2019-07-26 DIAGNOSIS — E785 Hyperlipidemia, unspecified: Secondary | ICD-10-CM

## 2019-07-26 DIAGNOSIS — E1169 Type 2 diabetes mellitus with other specified complication: Secondary | ICD-10-CM

## 2019-07-26 LAB — CBC
Hematocrit: 26 % — ABNORMAL LOW (ref 34.0–46.6)
Hemoglobin: 8.5 g/dL — ABNORMAL LOW (ref 11.1–15.9)
MCH: 30.9 pg (ref 26.6–33.0)
MCHC: 32.7 g/dL (ref 31.5–35.7)
MCV: 95 fL (ref 79–97)
Platelets: 275 10*3/uL (ref 150–450)
RBC: 2.75 x10E6/uL — ABNORMAL LOW (ref 3.77–5.28)
RDW: 12.6 % (ref 11.7–15.4)
WBC: 6.1 10*3/uL (ref 3.4–10.8)

## 2019-07-26 LAB — BASIC METABOLIC PANEL WITH GFR
BUN/Creatinine Ratio: 18 (ref 12–28)
BUN: 22 mg/dL (ref 8–27)
CO2: 22 mmol/L (ref 20–29)
Calcium: 8.3 mg/dL — ABNORMAL LOW (ref 8.7–10.3)
Chloride: 112 mmol/L — ABNORMAL HIGH (ref 96–106)
Creatinine, Ser: 1.22 mg/dL — ABNORMAL HIGH (ref 0.57–1.00)
GFR calc Af Amer: 47 mL/min/1.73 — ABNORMAL LOW (ref >59)
GFR calc non Af Amer: 41 mL/min/1.73 — ABNORMAL LOW (ref >59)
Glucose: 133 mg/dL — ABNORMAL HIGH (ref 65–99)
Potassium: 5.3 mmol/L — ABNORMAL HIGH (ref 3.5–5.2)
Sodium: 144 mmol/L (ref 134–144)

## 2019-07-26 LAB — BRAIN NATRIURETIC PEPTIDE: BNP: 393.5 pg/mL — ABNORMAL HIGH (ref 0.0–100.0)

## 2019-08-08 ENCOUNTER — Inpatient Hospital Stay: Payer: Medicare HMO | Attending: Hematology

## 2019-08-08 ENCOUNTER — Inpatient Hospital Stay: Payer: Medicare HMO

## 2019-08-08 ENCOUNTER — Other Ambulatory Visit: Payer: Self-pay

## 2019-08-08 VITALS — BP 126/64 | HR 62 | Temp 98.0°F | Resp 17 | Ht <= 58 in | Wt 127.5 lb

## 2019-08-08 DIAGNOSIS — D649 Anemia, unspecified: Secondary | ICD-10-CM

## 2019-08-08 DIAGNOSIS — D500 Iron deficiency anemia secondary to blood loss (chronic): Secondary | ICD-10-CM

## 2019-08-08 DIAGNOSIS — K922 Gastrointestinal hemorrhage, unspecified: Secondary | ICD-10-CM | POA: Diagnosis not present

## 2019-08-08 LAB — CBC WITH DIFFERENTIAL/PLATELET
Abs Immature Granulocytes: 0.02 10*3/uL (ref 0.00–0.07)
Basophils Absolute: 0 10*3/uL (ref 0.0–0.1)
Basophils Relative: 1 %
Eosinophils Absolute: 0.2 10*3/uL (ref 0.0–0.5)
Eosinophils Relative: 5 %
HCT: 29.4 % — ABNORMAL LOW (ref 36.0–46.0)
Hemoglobin: 9 g/dL — ABNORMAL LOW (ref 12.0–15.0)
Immature Granulocytes: 0 %
Lymphocytes Relative: 7 %
Lymphs Abs: 0.3 10*3/uL — ABNORMAL LOW (ref 0.7–4.0)
MCH: 30.7 pg (ref 26.0–34.0)
MCHC: 30.6 g/dL (ref 30.0–36.0)
MCV: 100.3 fL — ABNORMAL HIGH (ref 80.0–100.0)
Monocytes Absolute: 0.4 10*3/uL (ref 0.1–1.0)
Monocytes Relative: 10 %
Neutro Abs: 3.5 10*3/uL (ref 1.7–7.7)
Neutrophils Relative %: 77 %
Platelets: 254 10*3/uL (ref 150–400)
RBC: 2.93 MIL/uL — ABNORMAL LOW (ref 3.87–5.11)
RDW: 14.7 % (ref 11.5–15.5)
WBC: 4.5 10*3/uL (ref 4.0–10.5)
nRBC: 0 % (ref 0.0–0.2)

## 2019-08-08 LAB — FERRITIN: Ferritin: 634 ng/mL — ABNORMAL HIGH (ref 11–307)

## 2019-08-08 LAB — IRON AND TIBC
Iron: 47 ug/dL (ref 41–142)
Saturation Ratios: 20 % — ABNORMAL LOW (ref 21–57)
TIBC: 234 ug/dL — ABNORMAL LOW (ref 236–444)
UIBC: 187 ug/dL (ref 120–384)

## 2019-08-08 LAB — SAMPLE TO BLOOD BANK

## 2019-08-08 MED ORDER — SODIUM CHLORIDE 0.9 % IV SOLN
750.0000 mg | Freq: Once | INTRAVENOUS | Status: AC
  Administered 2019-08-08: 750 mg via INTRAVENOUS
  Filled 2019-08-08: qty 15

## 2019-08-08 MED ORDER — SODIUM CHLORIDE 0.9 % IV SOLN
Freq: Once | INTRAVENOUS | Status: AC
  Administered 2019-08-08: 10:00:00 via INTRAVENOUS
  Filled 2019-08-08: qty 250

## 2019-08-08 NOTE — Patient Instructions (Signed)

## 2019-08-08 NOTE — Progress Notes (Signed)
Hgb 9.0.  MD [facility] aware, pt will not receive 1 unit PRBCs.  Pt verbalized understanding.

## 2019-08-16 ENCOUNTER — Other Ambulatory Visit: Payer: Self-pay | Admitting: Medical

## 2019-08-20 ENCOUNTER — Other Ambulatory Visit: Payer: Self-pay | Admitting: Family Medicine

## 2019-08-20 NOTE — Telephone Encounter (Signed)
[facility] is requesting to fill pt spironolactone. Please advise [facility]

## 2019-08-22 ENCOUNTER — Other Ambulatory Visit: Payer: Self-pay | Admitting: Medical

## 2019-08-22 ENCOUNTER — Telehealth: Payer: Self-pay | Admitting: Family Medicine

## 2019-08-22 MED ORDER — AMOXICILLIN 875 MG PO TABS: 875.0000 mg | ORAL_TABLET | Freq: Two times a day (BID) | ORAL | 0 refills | Status: DC

## 2019-08-22 NOTE — Telephone Encounter (Signed)
Pt son called and states that pt right ear is still bothering her and is requesting that you send her in another round of antiobtiocs, [facility] uses [id number] - [place], [place] - [address] he would like a call back if something is called in [phone]

## 2019-08-22 NOTE — Telephone Encounter (Signed)
A few things...   I didn't get a chance to respond to this message til 5:18pm as I was slammed today so busy I saw patient through lunch and didn't get a moment to breath.   Thus, if anything urgent, sometimes it may be helpful to come to me personally or since its Dr. Lanice Shirts patient, ask him as well since he was here today.  Thus, we try to respond to phone calls as quick as possible, but I have to prioritize seeing the patients in front of me in the office first which is why phone calls come second.     Second, I saw her recently and specifically advised a 1 week follow up with Dr Redmond School or cardiology given weight gain, swelling, and medication changes last visit regarding swelling.  I don't see that she has had that follow up, so please schedule this now.

## 2019-08-23 NOTE — Telephone Encounter (Signed)
I had added another fluid pill temporarily to help the swelling, Spironolactone.   Its up to Dr. Redmond School or cardiology if they feel she needs to remain on this which in part requires monitoring her weights over the course of this month and overall symptoms.  Thus, she still needs follow up with one of them by the time the fluid pill prescription runs out at the end of this month.

## 2019-08-23 NOTE — Telephone Encounter (Signed)
Spoke to patient son in reference to following up with Dr. Redmond School or cardiology due to the swelling ans other changes and he said that it has all calmed down now.

## 2019-08-23 NOTE — Telephone Encounter (Signed)
forwarding to you  °

## 2019-08-27 NOTE — Telephone Encounter (Signed)
Please advise if pt should remain on fluid pill. See previous note in call. [place]

## 2019-08-28 NOTE — Telephone Encounter (Signed)
Pt son advised. [place]

## 2019-08-28 NOTE — Telephone Encounter (Signed)
yes

## 2019-08-31 ENCOUNTER — Other Ambulatory Visit: Payer: Self-pay | Admitting: Medical

## 2019-09-05 ENCOUNTER — Other Ambulatory Visit: Payer: Self-pay | Admitting: Hematology

## 2019-09-05 ENCOUNTER — Inpatient Hospital Stay: Payer: Medicare HMO

## 2019-09-05 ENCOUNTER — Other Ambulatory Visit: Payer: Self-pay

## 2019-09-05 ENCOUNTER — Inpatient Hospital Stay: Payer: Medicare HMO | Attending: Hematology

## 2019-09-05 VITALS — BP 135/67 | HR 62 | Temp 97.9°F | Resp 17

## 2019-09-05 DIAGNOSIS — D500 Iron deficiency anemia secondary to blood loss (chronic): Secondary | ICD-10-CM | POA: Diagnosis not present

## 2019-09-05 DIAGNOSIS — K922 Gastrointestinal hemorrhage, unspecified: Secondary | ICD-10-CM | POA: Insufficient documentation

## 2019-09-05 DIAGNOSIS — D649 Anemia, unspecified: Secondary | ICD-10-CM

## 2019-09-05 LAB — CBC WITH DIFFERENTIAL/PLATELET
Abs Immature Granulocytes: 0.06 10*3/uL (ref 0.00–0.07)
Basophils Absolute: 0 10*3/uL (ref 0.0–0.1)
Basophils Relative: 1 %
Eosinophils Absolute: 0.2 10*3/uL (ref 0.0–0.5)
Eosinophils Relative: 3 %
HCT: 27.4 % — ABNORMAL LOW (ref 36.0–46.0)
Hemoglobin: 8.4 g/dL — ABNORMAL LOW (ref 12.0–15.0)
Immature Granulocytes: 1 %
Lymphocytes Relative: 6 %
Lymphs Abs: 0.4 10*3/uL — ABNORMAL LOW (ref 0.7–4.0)
MCH: 31.1 pg (ref 26.0–34.0)
MCHC: 30.7 g/dL (ref 30.0–36.0)
MCV: 101.5 fL — ABNORMAL HIGH (ref 80.0–100.0)
Monocytes Absolute: 0.8 10*3/uL (ref 0.1–1.0)
Monocytes Relative: 10 %
Neutro Abs: 5.9 10*3/uL (ref 1.7–7.7)
Neutrophils Relative %: 79 %
Platelets: 261 10*3/uL (ref 150–400)
RBC: 2.7 MIL/uL — ABNORMAL LOW (ref 3.87–5.11)
RDW: 15.5 % (ref 11.5–15.5)
WBC: 7.3 10*3/uL (ref 4.0–10.5)
nRBC: 0 % (ref 0.0–0.2)

## 2019-09-05 LAB — IRON AND TIBC
Iron: 48 ug/dL (ref 41–142)
Saturation Ratios: 22 % (ref 21–57)
TIBC: 220 ug/dL — ABNORMAL LOW (ref 236–444)
UIBC: 172 ug/dL (ref 120–384)

## 2019-09-05 LAB — SAMPLE TO BLOOD BANK

## 2019-09-05 LAB — FERRITIN: Ferritin: 829 ng/mL — ABNORMAL HIGH (ref 11–307)

## 2019-09-05 MED ORDER — SODIUM CHLORIDE 0.9 % IV SOLN
750.0000 mg | Freq: Once | INTRAVENOUS | Status: AC
  Administered 2019-09-05: 750 mg via INTRAVENOUS
  Filled 2019-09-05: qty 15

## 2019-09-05 MED ORDER — SODIUM CHLORIDE 0.9 % IV SOLN
INTRAVENOUS | Status: DC
  Filled 2019-09-05: qty 250

## 2019-09-05 NOTE — Patient Instructions (Signed)

## 2019-09-07 ENCOUNTER — Other Ambulatory Visit: Payer: Self-pay | Admitting: Family Medicine

## 2019-09-10 ENCOUNTER — Other Ambulatory Visit: Payer: Self-pay | Admitting: Family Medicine

## 2019-09-12 ENCOUNTER — Other Ambulatory Visit: Payer: Self-pay | Admitting: Family Medicine

## 2019-09-19 ENCOUNTER — Other Ambulatory Visit: Payer: Self-pay | Admitting: Family Medicine

## 2019-09-19 ENCOUNTER — Other Ambulatory Visit: Payer: Self-pay | Admitting: Cardiology

## 2019-09-21 ENCOUNTER — Other Ambulatory Visit: Payer: Self-pay | Admitting: Family Medicine

## 2019-09-26 ENCOUNTER — Other Ambulatory Visit: Payer: Self-pay | Admitting: Family Medicine

## 2019-10-02 NOTE — Progress Notes (Signed)
Marland Kitchen    HEMATOLOGY/ONCOLOGY CLINIC NOTE  Date of Service: 10/03/19    Patient Care Team: [name], MD as PCP - General (Family Medicine) Jerline Pain, MD as PCP - Cardiology (Cardiology)  CHIEF COMPLAINTS/PURPOSE OF CONSULTATION:  Iron deficiency anemia due to GI bleeding from multiple AVMs  DIAGNOSIS: Iron deficiency anemia likely related to ongoing chronic GI bleeding from multiple AVMs  TREATMENT -IV iron replacement with injectafer as needed (tolerated well). (Had some shortness of breath with IV Feraheme - which was likely fluid overload as opposed to a true allergic reaction but patient has been hesitant to take this)  HISTORY OF PRESENTING ILLNESS:  Please see previous note for details  INTERVAL HISTORY Connie [name] is a 86 y.o. female here for followup for her IDA. The patient's last visit with [place] was on 07/11/2019. The pt reports that she is doing well overall.  The pt reports that she had a good holiday and is taking reasonable safety precautions in light of the pandemic. Pt has not yet registered for the COVID19 vaccine but is open to doing so. She is noticing bloody/black stools about once every 2 weeks. Pt has been eating well and denies any unexpected weight loss. Pt has not been wearing compression socks regularly but has been elevating her legs.   Lab results today (10/03/19) of CBC w/diff is as follows: all values are WNL except for RBC at 2.74, Hgb at 8.8, HCT at 28.0, MCV at 102.2, RDW at 15.8, Lymphs Abs at 0.4K.  10/03/2019 Ferritin at 1060 10/03/2019 Iron and TIBC is as follows: all values are WNL  On review of systems, pt reports bloody/black stools, eating well, leg swelling and denies unexpected weight loss, abdominal pain and any other symptoms.   MEDICAL HISTORY:  Past Medical History:  Diagnosis Date  . Allergy    RHINITIS  . Angiodysplasia of duodenum    hx/notes 11/21/2015  . Angiodysplasia of stomach    hx/notes 11/21/2015  . Arthritis    "joints ache"  . Bleeding gastric ulcer    hx/notes 11/21/2015  . Chronic blood loss anemia    secondary to angiodysplasia of the stomach and duodenum as well as history of bleeding gastric ulcer hx/notes 11/21/2015  . Chronic systolic CHF (congestive heart failure) ([place]) 12/23/2015  . History of blood transfusion 01/2015; 06/2015; 11/21/2015  . Hypercholesteremia   . Hypertension   . Hypothyroidism   . Obesity   . Pneumonia "several times"  . Type II diabetes mellitus ([place])     SURGICAL HISTORY: Past Surgical History:  Procedure Laterality Date  . BIOPSY  07/24/2018   Procedure: BIOPSY;  Surgeon: Carol Ada, MD;  Location: [facility];  Service: Endoscopy;;  . CARDIAC CATHETERIZATION N/A 12/19/2015   Procedure: Left Heart Cath and Coronary Angiography;  Surgeon: Leonie Man, MD;  Location: [facility];  Service: Cardiovascular;  Laterality: N/A;  . COLONOSCOPY N/A 08/30/2014   Procedure: COLONOSCOPY;  Surgeon: Beryle Beams, MD;  Location: [facility];  Service: Endoscopy;  Laterality: N/A;  . COLONOSCOPY N/A 02/21/2015   Procedure: COLONOSCOPY;  Surgeon: Carol Ada, MD;  Location: [facility];  Service: Endoscopy;  Laterality: N/A;  . COLONOSCOPY WITH PROPOFOL N/A 07/24/2018   Procedure: COLONOSCOPY WITH PROPOFOL;  Surgeon: Carol Ada, MD;  Location: [facility];  Service: Endoscopy;  Laterality: N/A;  . ENTEROSCOPY N/A 06/06/2015   Procedure: ENTEROSCOPY;  Surgeon: Carol Ada, MD;  Location: [facility];  Service: Endoscopy;  Laterality: N/A;  .  ENTEROSCOPY N/A 07/24/2018   Procedure: ENTEROSCOPY;  Surgeon: Carol Ada, MD;  Location: [facility];  Service: Endoscopy;  Laterality: N/A;  . FRACTURE SURGERY    . GIVENS CAPSULE STUDY N/A 05/15/2015   Procedure: GIVENS CAPSULE STUDY;  Surgeon: Carol Ada, MD;  Location: [facility] END[facility]COPY;  Service: Endoscopy;  Laterality: N/A;  . HOT HEMOSTASIS N/A 08/30/2014   Procedure: HOT HEMOSTASIS (ARGON PLASMA COAGULATION/BICAP);   Surgeon: Beryle Beams, MD;  Location: [facility];  Service: Endoscopy;  Laterality: N/A;  . HOT HEMOSTASIS N/A 06/06/2015   Procedure: HOT HEMOSTASIS (ARGON PLASMA COAGULATION/BICAP);  Surgeon: Carol Ada, MD;  Location: [facility];  Service: Endoscopy;  Laterality: N/A;  . HOT HEMOSTASIS N/A 07/24/2018   Procedure: HOT HEMOSTASIS (ARGON PLASMA COAGULATION/BICAP);  Surgeon: Carol Ada, MD;  Location: [facility];  Service: Endoscopy;  Laterality: N/A;  . JOINT REPLACEMENT    . REVISION TOTAL KNEE ARTHROPLASTY Bilateral 2004-2015  . SHOULDER OPEN ROTATOR CUFF REPAIR Right 12/2004   open subacromial decompression, distal clavicle  resection, rotator cuff repair/notes 02/02/2011  . TONSILLECTOMY  1940s  . TOTAL KNEE ARTHROPLASTY Bilateral ~ 2002  . TOTAL THYROIDECTOMY  ~ 1966    SOCIAL HISTORY: Social History   Socioeconomic History  . Marital status: Married    Spouse name: Not on file  . Number of children: Not on file  . Years of education: Not on file  . Highest education level: Not on file  Occupational History  . Not on file  Tobacco Use  . Smoking status: Never Smoker  . Smokeless tobacco: Never Used  Substance and Sexual Activity  . Alcohol use: No  . Drug use: No  . Sexual activity: Not Currently  Other Topics Concern  . Not on file  Social History Narrative  . Not on file   Social Determinants of Health   Financial Resource Strain:   . Difficulty of Paying Living Expenses: Not on file  Food Insecurity:   . Worried About Charity fundraiser in the Last Year: Not on file  . Ran Out of Food in the Last Year: Not on file  Transportation Needs:   . Lack of Transportation (Medical): Not on file  . Lack of Transportation (Non-Medical): Not on file  Physical Activity:   . Days of Exercise per Week: Not on file  . Minutes of Exercise per Session: Not on file  Stress:   . Feeling of Stress : Not on file  Social Connections:   . Frequency of Communication with  Friends and Family: Not on file  . Frequency of Social Gatherings with Friends and Family: Not on file  . Attends Religious Services: Not on file  . Active Member of Clubs or Organizations: Not on file  . Attends Archivist Meetings: Not on file  . Marital Status: Not on file  Intimate Partner Violence:   . Fear of Current or Ex-Partner: Not on file  . Emotionally Abused: Not on file  . Physically Abused: Not on file  . Sexually Abused: Not on file    FAMILY HISTORY: Family History  Problem Relation Age of Onset  . Asthma Mother   . Ulcers Father   . Anemia Sister     ALLERGIES:  has No Known Allergies.  MEDICATIONS:  Current Outpatient Medications  Medication Sig Dispense Refill  . ACCU-CHEK FASTCLIX LANCETS MISC 1 each by Does not apply route 2 (two) times daily. DX E11.9 204 each 3  . acetaminophen (TYLENOL) 500  MG tablet Take 500 mg by mouth every 6 (six) hours as needed for mild pain or headache.     Marland Kitchen amLODipine (NORVASC) 10 MG tablet TAKE 1 TABLET EVERY DAY 90 tablet 0  . amoxicillin (AMOXIL) 875 MG tablet TAKE 1 TABLET TWICE DAILY 20 tablet 0  . atorvastatin (LIPITOR) 40 MG tablet TAKE 1 TABLET EVERY DAY 90 tablet 0  . Blood Glucose Monitoring Suppl (ACCU-CHEK GUIDE) w/Device KIT 1 each by Does not apply route 2 (two) times daily. DX E11.9 1 kit 0  . Calcium Carb-Cholecalciferol (CALCIUM 1000 + D PO) Take 1 tablet by mouth.    . carvedilol (COREG) 12.5 MG tablet TAKE 1 TABLET BY MOUTH 2 TIMES DAILY WITH A MEAL. 180 tablet 2  . ENTRESTO 24-26 MG TAKE 1 TABLET TWICE DAILY 180 tablet 3  . furosemide (LASIX) 40 MG tablet TAKE 1 TABLET EVERY DAY 90 tablet 0  . glucose blood (ACCU-CHEK GUIDE) test strip Patient to test 2 times daily DX E11.9 Use as instructed 200 each 3  . Lancets Misc. (ACCU-CHEK FASTCLIX LANCET) KIT 1 each by Does not apply route 2 (two) times daily. DX E11.9 1 kit 0  . levothyroxine (SYNTHROID) 125 MCG tablet TAKE 1 TABLET EVERY DAY 90 tablet 3    . Multiple Vitamins-Minerals (WOMENS MULTIVITAMIN PLUS PO) Take 1 tablet by mouth daily.    . pantoprazole (PROTONIX) 20 MG tablet TAKE 1 TABLET EVERY DAY 90 tablet 0  . pioglitazone (ACTOS) 30 MG tablet TAKE 1 TABLET EVERY DAY 90 tablet 0  . potassium chloride (KLOR-CON) 10 MEQ tablet TAKE 1 TABLET TWICE DAILY 180 tablet 1  . spironolactone (ALDACTONE) 25 MG tablet TAKE 1 TABLET BY MOUTH EVERY DAY 30 tablet 0   No current facility-administered medications for this visit.   Facility-Administered Medications Ordered in Other Visits  Medication Dose Route Frequency Provider Last Rate Last Admin  . 0.9 %  sodium chloride infusion   Intravenous Continuous Brunetta Genera, MD 20 mL/hr at 10/03/19 1008 New Bag at 10/03/19 1008  . ferric carboxymaltose (INJECTAFER) 750 mg in sodium chloride 0.9 % 250 mL IVPB  750 mg Intravenous Once Brunetta Genera, MD 795 mL/hr at 10/03/19 1009 750 mg at 10/03/19 1009    REVIEW OF SYSTEMS:   A 10+ POINT REVIEW OF SYSTEMS WAS OBTAINED including neurology, dermatology, psychiatry, cardiac, respiratory, lymph, extremities, GI, GU, Musculoskeletal, constitutional, breasts, reproductive, HEENT.  All pertinent positives are noted in the HPI.  All others are negative.   PHYSICAL EXAMINATION:  ECOG PERFORMANCE STATUS: 1 - Symptomatic but completely ambulatory  Vitals:   10/03/19 0910  BP: (!) 157/45  Pulse: 78  Resp: 18  Temp: 98.2 F (36.8 C)  SpO2: 100%   Filed Weights   10/03/19 0910  Weight: 123 lb 9.6 oz (56.1 kg)   .Body mass index is 27.71 kg/m.  Exam was given in a chair   GENERAL:alert, in no acute distress and comfortable SKIN: no acute rashes, no significant lesions EYES: conjunctiva are pink and non-injected, sclera anicteric OROPHARYNX: MMM, no exudates, no oropharyngeal erythema or ulceration NECK: supple, no JVD LYMPH:  no palpable lymphadenopathy in the cervical, axillary or inguinal regions LUNGS: clear to auscultation b/l  with normal respiratory effort HEART: regular rate & rhythm ABDOMEN:  normoactive bowel sounds , non tender, not distended. No palpable hepatosplenomegaly.  Extremity: 2+ b/l pedal edema PSYCH: alert & oriented x 3 with fluent speech NEURO: no focal motor/sensory deficits  LABORATORY  DATA:    CBC Latest Ref Rng & Units 10/03/2019 09/05/2019 08/08/2019  WBC 4.0 - 10.5 K/uL 6.3 7.3 4.5  Hemoglobin 12.0 - 15.0 g/dL 8.8(L) 8.4(L) 9.0(L)  Hematocrit 36.0 - 46.0 % 28.0(L) 27.4(L) 29.4(L)  Platelets 150 - 400 K/uL 238 261 254    . CBC    Component Value Date/Time   WBC 6.3 10/03/2019 0834   RBC 2.74 (L) 10/03/2019 0834   HGB 8.8 (L) 10/03/2019 0834   HGB 8.5 (L) 07/25/2019 1537   HGB 10.8 (L) 06/20/2017 1326   HCT 28.0 (L) 10/03/2019 0834   HCT 26.0 (L) 07/25/2019 1537   HCT 34.9 06/20/2017 1326   PLT 238 10/03/2019 0834   PLT 275 07/25/2019 1537   MCV 102.2 (H) 10/03/2019 0834   MCV 95 07/25/2019 1537   MCV 94.8 06/20/2017 1326   MCH 32.1 10/03/2019 0834   MCHC 31.4 10/03/2019 0834   RDW 15.8 (H) 10/03/2019 0834   RDW 12.6 07/25/2019 1537   RDW 14.2 06/20/2017 1326   LYMPHSABS 0.4 (L) 10/03/2019 0834   LYMPHSABS 0.5 (L) 08/01/2018 1217   LYMPHSABS 0.6 (L) 06/20/2017 1326   MONOABS 0.5 10/03/2019 0834   MONOABS 0.7 06/20/2017 1326   EOSABS 0.3 10/03/2019 0834   EOSABS 0.2 08/01/2018 1217   BASOSABS 0.0 10/03/2019 0834   BASOSABS 0.1 08/01/2018 1217   BASOSABS 0.0 06/20/2017 1326    . CMP Latest Ref Rng & Units 07/25/2019 06/13/2019 05/16/2019  Glucose 65 - 99 mg/dL 133(H) 132(H) 114(H)  BUN 8 - 27 mg/dL 22 36(H) 26(H)  Creatinine 0.57 - 1.00 mg/dL 1.22(H) 1.22(H) 1.17(H)  Sodium 134 - 144 mmol/L 144 144 142  Potassium 3.5 - 5.2 mmol/L 5.3(H) 4.1 4.4  Chloride 96 - 106 mmol/L 112(H) 108 107  CO2 20 - 29 mmol/L _0 Calcium 8.7 - 10.3 mg/dL 8.3(L) 8.9 8.6(L)  Total Protein 6.5 - 8.1 g/dL - 6.3(L) 6.3(L)  Total Bilirubin 0.3 - 1.2 mg/dL - 0.4 0.6  Alkaline Phos  38 - 126 U/L - 84 102  AST 15 - 41 U/L - 21 20  ALT 0 - 44 U/L - <6 7   . Lab Results  Component Value Date   IRON 78 10/03/2019   TIBC 243 10/03/2019   IRONPCTSAT 32 10/03/2019   (Iron and TIBC)  Lab Results  Component Value Date   FERRITIN 1,060 (H) 10/03/2019    . Lab Results  Component Value Date   IRON 78 10/03/2019   TIBC 243 10/03/2019   IRONPCTSAT 32 10/03/2019   (Iron and TIBC)  Lab Results  Component Value Date   FERRITIN 1,060 (H) 10/03/2019      ASSESSMENT & PLAN:   86 y.o. Caucasian female with  #1 Microcytic anemia likely related to iron deficiency from chronic GI bleeding. No known disorder causing chronic inflammation at this time.  #2 history of recurrent GI bleeding related to upper and lower GI tract AVMs and gastric erosions. Has had several admissions with requirement of multiple PRBC transfusions. Dropping ferritin suggests ongoing slow GI losses. No clinically overt GI bleeding noted. Last colonoscopy from 07/24/18 revealed two non-bleeding angiodysplastic lesions which were treated with a monopolar probe  Last small bowel endoscopy from 07/24/18 revealed gastritis with hemorrhage, negative for H.pylori  #3 Hypothyroidism on levothyroxine .  #4 ?? Reaction to [facility] - current hospital admission that shortness of breath appears to be more likely related to CHF decompensation than allergic reaction to Feraheme.  However patient is very anxious and hesitant to get this preparation. Switched to and tolerated Injectafer  PLAN:  -Discussed pt labwork today, 10/03/19; blood counts are stable -Discussed 10/03/2019 Ferritin is good at 1060 -Discussed 10/03/2019 Iron and TIBC is as follows: all values are WNL -10/03/19 Hgb at 8.8, no indication for a blood transfusion today  -Will continue IV iron infusions every 2 months -Will consider moving IV iron infusions to every 3 months if iron levels continue to rise -Continue to hold PO iron as this  makes it difficult to differentiate the cause of her dark stool color, and we are replacing Iron IV  -Will give blood transfusions as needed  -Recommended pt receive COVID19 vaccine when available -Recommended pt use compression socks, continue to elevate legs  -Continue Vitamin B complex -Absolutely no NSAIDs, only Tylenol for mild pain   -Will give IV iron today -Will get labs and IV iron infusion in 2 months -Will see back in 4 months with labs and IV iron infusion   FOLLOW UP: Please schedule for labs and IV iron in 2 months Please schedule for labs, MD visit and IV iron in 4 months   The total time spent in the appt was 20 minutes and more than 50% was on counseling and direct patient cares.  All of the patient's questions were answered with apparent satisfaction. The patient knows to call the clinic with any problems, questions or concerns.    Sullivan Lone MD [place] AAHIVMS [facility] Hematology/Oncology Physician [facility]  (Office):       [phone] (Work cell):  [phone] (Fax):           [phone]  I, Yevette Edwards, am acting as a scribe for Dr. Sullivan Lone.   .I have reviewed the above documentation for accuracy and completeness, and I agree with the above. Brunetta Genera MD

## 2019-10-03 ENCOUNTER — Inpatient Hospital Stay: Payer: Medicare HMO

## 2019-10-03 ENCOUNTER — Encounter: Payer: Self-pay | Admitting: Hematology

## 2019-10-03 ENCOUNTER — Other Ambulatory Visit: Payer: Self-pay

## 2019-10-03 ENCOUNTER — Inpatient Hospital Stay (HOSPITAL_BASED_OUTPATIENT_CLINIC_OR_DEPARTMENT_OTHER): Payer: Medicare HMO | Admitting: Hematology

## 2019-10-03 ENCOUNTER — Inpatient Hospital Stay: Payer: Medicare HMO | Attending: Hematology

## 2019-10-03 ENCOUNTER — Telehealth: Payer: Self-pay | Admitting: Hematology

## 2019-10-03 VITALS — BP 157/45 | HR 78 | Temp 98.2°F | Resp 18 | Ht <= 58 in | Wt 123.6 lb

## 2019-10-03 VITALS — BP 138/43 | HR 66

## 2019-10-03 DIAGNOSIS — I110 Hypertensive heart disease with heart failure: Secondary | ICD-10-CM | POA: Diagnosis not present

## 2019-10-03 DIAGNOSIS — E119 Type 2 diabetes mellitus without complications: Secondary | ICD-10-CM | POA: Diagnosis not present

## 2019-10-03 DIAGNOSIS — I509 Heart failure, unspecified: Secondary | ICD-10-CM | POA: Diagnosis not present

## 2019-10-03 DIAGNOSIS — G35 Multiple sclerosis: Secondary | ICD-10-CM | POA: Insufficient documentation

## 2019-10-03 DIAGNOSIS — K922 Gastrointestinal hemorrhage, unspecified: Secondary | ICD-10-CM | POA: Diagnosis not present

## 2019-10-03 DIAGNOSIS — E7800 Pure hypercholesterolemia, unspecified: Secondary | ICD-10-CM | POA: Diagnosis not present

## 2019-10-03 DIAGNOSIS — D500 Iron deficiency anemia secondary to blood loss (chronic): Secondary | ICD-10-CM

## 2019-10-03 DIAGNOSIS — D649 Anemia, unspecified: Secondary | ICD-10-CM

## 2019-10-03 DIAGNOSIS — Z7984 Long term (current) use of oral hypoglycemic drugs: Secondary | ICD-10-CM | POA: Insufficient documentation

## 2019-10-03 DIAGNOSIS — E039 Hypothyroidism, unspecified: Secondary | ICD-10-CM | POA: Diagnosis not present

## 2019-10-03 DIAGNOSIS — Z79899 Other long term (current) drug therapy: Secondary | ICD-10-CM | POA: Insufficient documentation

## 2019-10-03 LAB — IRON AND TIBC
Iron: 78 ug/dL (ref 41–142)
Saturation Ratios: 32 % (ref 21–57)
TIBC: 243 ug/dL (ref 236–444)
UIBC: 164 ug/dL (ref 120–384)

## 2019-10-03 LAB — CBC WITH DIFFERENTIAL/PLATELET
Abs Immature Granulocytes: 0.02 10*3/uL (ref 0.00–0.07)
Basophils Absolute: 0 10*3/uL (ref 0.0–0.1)
Basophils Relative: 1 %
Eosinophils Absolute: 0.3 10*3/uL (ref 0.0–0.5)
Eosinophils Relative: 5 %
HCT: 28 % — ABNORMAL LOW (ref 36.0–46.0)
Hemoglobin: 8.8 g/dL — ABNORMAL LOW (ref 12.0–15.0)
Immature Granulocytes: 0 %
Lymphocytes Relative: 7 %
Lymphs Abs: 0.4 10*3/uL — ABNORMAL LOW (ref 0.7–4.0)
MCH: 32.1 pg (ref 26.0–34.0)
MCHC: 31.4 g/dL (ref 30.0–36.0)
MCV: 102.2 fL — ABNORMAL HIGH (ref 80.0–100.0)
Monocytes Absolute: 0.5 10*3/uL (ref 0.1–1.0)
Monocytes Relative: 8 %
Neutro Abs: 5 10*3/uL (ref 1.7–7.7)
Neutrophils Relative %: 79 %
Platelets: 238 10*3/uL (ref 150–400)
RBC: 2.74 MIL/uL — ABNORMAL LOW (ref 3.87–5.11)
RDW: 15.8 % — ABNORMAL HIGH (ref 11.5–15.5)
WBC: 6.3 10*3/uL (ref 4.0–10.5)
nRBC: 0 % (ref 0.0–0.2)

## 2019-10-03 LAB — SAMPLE TO BLOOD BANK

## 2019-10-03 LAB — FERRITIN: Ferritin: 1060 ng/mL — ABNORMAL HIGH (ref 11–307)

## 2019-10-03 MED ORDER — SODIUM CHLORIDE 0.9 % IV SOLN
INTRAVENOUS | Status: DC
  Filled 2019-10-03: qty 250

## 2019-10-03 MED ORDER — SODIUM CHLORIDE 0.9 % IV SOLN
750.0000 mg | Freq: Once | INTRAVENOUS | Status: AC
  Administered 2019-10-03: 750 mg via INTRAVENOUS
  Filled 2019-10-03: qty 15

## 2019-10-03 NOTE — Telephone Encounter (Signed)
Scheduled appt per 1/13 los.  Sent a message to HIM pool to get a calendar mailed out. 

## 2019-10-03 NOTE — Progress Notes (Signed)
Per Dr. [name], proceed with [facility] today. Pharmacy aware.

## 2019-10-03 NOTE — Patient Instructions (Signed)

## 2019-10-17 ENCOUNTER — Other Ambulatory Visit: Payer: Self-pay | Admitting: Family Medicine

## 2019-10-30 ENCOUNTER — Other Ambulatory Visit: Payer: Self-pay | Admitting: *Deleted

## 2019-10-30 ENCOUNTER — Other Ambulatory Visit: Payer: Self-pay | Admitting: Family Medicine

## 2019-10-31 ENCOUNTER — Inpatient Hospital Stay: Payer: Medicare HMO | Attending: Hematology

## 2019-10-31 ENCOUNTER — Other Ambulatory Visit: Payer: Self-pay

## 2019-10-31 ENCOUNTER — Inpatient Hospital Stay: Payer: Medicare HMO

## 2019-10-31 DIAGNOSIS — D500 Iron deficiency anemia secondary to blood loss (chronic): Secondary | ICD-10-CM | POA: Diagnosis not present

## 2019-10-31 DIAGNOSIS — D649 Anemia, unspecified: Secondary | ICD-10-CM

## 2019-10-31 DIAGNOSIS — K922 Gastrointestinal hemorrhage, unspecified: Secondary | ICD-10-CM | POA: Diagnosis not present

## 2019-10-31 LAB — CMP (CANCER CENTER ONLY)
ALT: 8 U/L (ref 0–44)
AST: 23 U/L (ref 15–41)
Albumin: 4.2 g/dL (ref 3.5–5.0)
Alkaline Phosphatase: 62 U/L (ref 38–126)
Anion gap: 9 (ref 5–15)
BUN: 39 mg/dL — ABNORMAL HIGH (ref 8–23)
CO2: 23 mmol/L (ref 22–32)
Calcium: 8.6 mg/dL — ABNORMAL LOW (ref 8.9–10.3)
Chloride: 108 mmol/L (ref 98–111)
Creatinine: 1.61 mg/dL — ABNORMAL HIGH (ref 0.44–1.00)
GFR, Est AFR Am: 34 mL/min — ABNORMAL LOW (ref >60)
GFR, Estimated: 29 mL/min — ABNORMAL LOW (ref >60)
Glucose, Bld: 133 mg/dL — ABNORMAL HIGH (ref 70–99)
Potassium: 5.2 mmol/L — ABNORMAL HIGH (ref 3.5–5.1)
Sodium: 140 mmol/L (ref 135–145)
Total Bilirubin: 0.5 mg/dL (ref 0.3–1.2)
Total Protein: 7.2 g/dL (ref 6.5–8.1)

## 2019-10-31 LAB — CBC WITH DIFFERENTIAL/PLATELET
Abs Immature Granulocytes: 0.03 10*3/uL (ref 0.00–0.07)
Basophils Absolute: 0 10*3/uL (ref 0.0–0.1)
Basophils Relative: 1 %
Eosinophils Absolute: 0.4 10*3/uL (ref 0.0–0.5)
Eosinophils Relative: 5 %
HCT: 30.4 % — ABNORMAL LOW (ref 36.0–46.0)
Hemoglobin: 9.6 g/dL — ABNORMAL LOW (ref 12.0–15.0)
Immature Granulocytes: 0 %
Lymphocytes Relative: 6 %
Lymphs Abs: 0.5 10*3/uL — ABNORMAL LOW (ref 0.7–4.0)
MCH: 32.1 pg (ref 26.0–34.0)
MCHC: 31.6 g/dL (ref 30.0–36.0)
MCV: 101.7 fL — ABNORMAL HIGH (ref 80.0–100.0)
Monocytes Absolute: 0.6 10*3/uL (ref 0.1–1.0)
Monocytes Relative: 9 %
Neutro Abs: 6 10*3/uL (ref 1.7–7.7)
Neutrophils Relative %: 79 %
Platelets: 228 10*3/uL (ref 150–400)
RBC: 2.99 MIL/uL — ABNORMAL LOW (ref 3.87–5.11)
RDW: 15.2 % (ref 11.5–15.5)
WBC: 7.5 10*3/uL (ref 4.0–10.5)
nRBC: 0 % (ref 0.0–0.2)

## 2019-10-31 LAB — SAMPLE TO BLOOD BANK

## 2019-10-31 LAB — FERRITIN: Ferritin: 1912 ng/mL — ABNORMAL HIGH (ref 11–307)

## 2019-10-31 NOTE — Progress Notes (Signed)
Per Dr. [name] no need for PRBC infusion today.  Pt notified and discharged.

## 2019-11-01 ENCOUNTER — Telehealth: Payer: Self-pay

## 2019-11-01 ENCOUNTER — Encounter: Payer: Self-pay | Admitting: Family Medicine

## 2019-11-01 ENCOUNTER — Ambulatory Visit (INDEPENDENT_AMBULATORY_CARE_PROVIDER_SITE_OTHER): Payer: Medicare HMO | Admitting: Family Medicine

## 2019-11-01 VITALS — BP 110/62 | HR 58 | Temp 96.8°F | Wt 120.8 lb

## 2019-11-01 DIAGNOSIS — G3184 Mild cognitive impairment, so stated: Secondary | ICD-10-CM

## 2019-11-01 DIAGNOSIS — E785 Hyperlipidemia, unspecified: Secondary | ICD-10-CM

## 2019-11-01 DIAGNOSIS — E1169 Type 2 diabetes mellitus with other specified complication: Secondary | ICD-10-CM

## 2019-11-01 DIAGNOSIS — E1159 Type 2 diabetes mellitus with other circulatory complications: Secondary | ICD-10-CM

## 2019-11-01 DIAGNOSIS — E039 Hypothyroidism, unspecified: Secondary | ICD-10-CM | POA: Diagnosis not present

## 2019-11-01 DIAGNOSIS — F329 Major depressive disorder, single episode, unspecified: Secondary | ICD-10-CM

## 2019-11-01 DIAGNOSIS — E118 Type 2 diabetes mellitus with unspecified complications: Secondary | ICD-10-CM | POA: Diagnosis not present

## 2019-11-01 DIAGNOSIS — F32A Depression, unspecified: Secondary | ICD-10-CM

## 2019-11-01 DIAGNOSIS — I10 Essential (primary) hypertension: Secondary | ICD-10-CM | POA: Diagnosis not present

## 2019-11-01 LAB — POCT GLYCOSYLATED HEMOGLOBIN (HGB A1C): Hemoglobin A1C: 5.6 % (ref 4.0–5.6)

## 2019-11-01 MED ORDER — DONEPEZIL HCL 5 MG PO TABS: 5.0000 mg | ORAL_TABLET | Freq: Every day | ORAL | 3 refills | Status: DC

## 2019-11-01 MED ORDER — CITALOPRAM HYDROBROMIDE 10 MG PO TABS: 10.0000 mg | ORAL_TABLET | Freq: Every day | ORAL | 3 refills | Status: AC

## 2019-11-01 NOTE — Patient Instructions (Signed)
Stay on all your present medications and take the 2 new ones.  Return here in 1 month.  Also make sure you are taking a multivitamin daily as well as vitamin D and calcium

## 2019-11-01 NOTE — Telephone Encounter (Signed)
[facility] called and advised that there is a reaction between the two just prescribed. Please advise [facility]

## 2019-11-01 NOTE — Addendum Note (Signed)
Addended by: Elyse Jarvis on: 11/01/2019 04:23 PM   Modules accepted: Orders

## 2019-11-01 NOTE — Progress Notes (Signed)
   Subjective:    Patient ID: Connie [name], female    DOB: 08-31-1934, 86 y.o.   MRN: [id number]  HPI She is here for a follow-up visit.  She has been watched closely by hematology for her underlying anemia.  This seems to be under good control with her present regimen.  She does have an underlying history of diabetes however is doing very well.  She is on Actos.  She continues on Synthroid and has no particular concerns or complaints about that.  Also continues on atorvastatin.  She is also supposed to be on calcium supplementation.  She does note some memory change and this has her quite frustrated.  She does feel quite lonely and depressed.  Wintertime is especially bad for her.  She does have a caregiver and her son does watch her closely.  She spends weekends with them.   Review of Systems     Objective:   Physical Exam Alert and in no distress.  Cardiac exam does show a 2/6 systolic murmur.  Lungs are clear to auscultation. Hemoglobin A1c is 5.6. MMSE of 21. Recent blood work was evaluated and does show slightly low calcium      Assessment & Plan:  Type 2 diabetes with complication ([name])  Hypothyroidism, unspecified type - Plan: TSH  Hyperlipidemia associated with type 2 diabetes mellitus ([place]) - Plan: Lipid panel  Hypertension associated with diabetes ([place])  Mild cognitive impairment - Plan: donepezil (ARICEPT) 5 MG tablet  Depression, unspecified depression type - Plan: citalopram (CELEXA) 10 MG tablet I will stop the Actos.  Check her thyroid function as well as lipids.  Continue her blood pressure medications.  I will also start her on Aricept and low-dose Celexa. I talked to the son and also recommend they get a med alert as she is home alone during the week.  Apparently phone is nearby but still think it is appropriate to have a med alert.  He was comfortable with that.  Reinforced the need to stay on vitamin D and calcium.

## 2019-11-02 LAB — LIPID PANEL
Chol/HDL Ratio: 3.5 ratio (ref 0.0–4.4)
Cholesterol, Total: 161 mg/dL (ref 100–199)
HDL: 46 mg/dL (ref >39)
LDL Chol Calc (NIH): 91 mg/dL (ref 0–99)
Triglycerides: 137 mg/dL (ref 0–149)
VLDL Cholesterol Cal: 24 mg/dL (ref 5–40)

## 2019-11-02 LAB — TSH: TSH: 7 u[IU]/mL — ABNORMAL HIGH (ref 0.450–4.500)

## 2019-11-06 ENCOUNTER — Telehealth: Payer: Self-pay

## 2019-11-06 NOTE — Telephone Encounter (Signed)
ERROR

## 2019-11-06 NOTE — Telephone Encounter (Signed)
Please advise if new med is ok to take due to interaction. Thanks [employer]

## 2019-11-07 NOTE — Telephone Encounter (Signed)
She is on a very low dose so hopefully this will be an issue.

## 2019-11-07 NOTE — Telephone Encounter (Signed)
Pt son was advised as well as the pharmacy. [place]

## 2019-11-11 ENCOUNTER — Other Ambulatory Visit: Payer: Self-pay | Admitting: Family Medicine

## 2019-11-29 ENCOUNTER — Other Ambulatory Visit: Payer: Self-pay | Admitting: Family Medicine

## 2019-11-29 ENCOUNTER — Encounter: Payer: Self-pay | Admitting: Family Medicine

## 2019-11-29 ENCOUNTER — Ambulatory Visit (INDEPENDENT_AMBULATORY_CARE_PROVIDER_SITE_OTHER): Payer: Medicare HMO | Admitting: Family Medicine

## 2019-11-29 ENCOUNTER — Other Ambulatory Visit: Payer: Self-pay

## 2019-11-29 VITALS — BP 118/70 | HR 59 | Temp 97.7°F | Wt 115.2 lb

## 2019-11-29 DIAGNOSIS — G3184 Mild cognitive impairment, so stated: Secondary | ICD-10-CM

## 2019-11-29 DIAGNOSIS — E039 Hypothyroidism, unspecified: Secondary | ICD-10-CM

## 2019-11-29 NOTE — Patient Instructions (Signed)
Double the dose of the Aricept.  To 2/day at the same time and also do the same thing with the citalopram.  Call when you are about to run out.  It will take 2 or 3 weeks before we know what the new dosing will do

## 2019-11-29 NOTE — Progress Notes (Signed)
   Subjective:    Patient ID: Connie [name], female    DOB: [birth year], 86 y.o.   MRN: [id number]  HPI She is here with her son who I talked to ahead of time.  He notes that her memory is getting worse.  She is also still emotionally labile.  Also did have a recent TSH which was elevated.  Apparently she does take the thyroid on an empty stomach and waits an hour.   Review of Systems     Objective:   Physical Exam Alert and in no distress.  Oriented to person only.  She appears stable.      Assessment & Plan:  Hypothyroidism, unspecified type - Plan: TSH  Mild cognitive impairment I will have the son double the Escitalopram and the Aricept until it is gone and call me and I will give the appropriate higher dose.  We will then have a virtual meeting approximately 2 to 4 weeks down the road to determine whether we should add another medication.  He was comfortable with that.

## 2019-11-30 ENCOUNTER — Inpatient Hospital Stay: Payer: Medicare HMO | Attending: Hematology

## 2019-11-30 ENCOUNTER — Inpatient Hospital Stay: Payer: Medicare HMO

## 2019-11-30 VITALS — BP 133/42 | HR 59 | Temp 98.7°F | Resp 18

## 2019-11-30 DIAGNOSIS — D649 Anemia, unspecified: Secondary | ICD-10-CM

## 2019-11-30 DIAGNOSIS — D500 Iron deficiency anemia secondary to blood loss (chronic): Secondary | ICD-10-CM

## 2019-11-30 DIAGNOSIS — K922 Gastrointestinal hemorrhage, unspecified: Secondary | ICD-10-CM | POA: Diagnosis not present

## 2019-11-30 LAB — CMP (CANCER CENTER ONLY)
ALT: <6 U/L (ref 0–44)
AST: 19 U/L (ref 15–41)
Albumin: 3.9 g/dL (ref 3.5–5.0)
Alkaline Phosphatase: 91 U/L (ref 38–126)
Anion gap: 10 (ref 5–15)
BUN: 43 mg/dL — ABNORMAL HIGH (ref 8–23)
CO2: 19 mmol/L — ABNORMAL LOW (ref 22–32)
Calcium: 8.8 mg/dL — ABNORMAL LOW (ref 8.9–10.3)
Chloride: 111 mmol/L (ref 98–111)
Creatinine: 1.38 mg/dL — ABNORMAL HIGH (ref 0.44–1.00)
GFR, Est AFR Am: 41 mL/min — ABNORMAL LOW (ref >60)
GFR, Estimated: 35 mL/min — ABNORMAL LOW (ref >60)
Glucose, Bld: 165 mg/dL — ABNORMAL HIGH (ref 70–99)
Potassium: 5.2 mmol/L — ABNORMAL HIGH (ref 3.5–5.1)
Sodium: 140 mmol/L (ref 135–145)
Total Bilirubin: 0.6 mg/dL (ref 0.3–1.2)
Total Protein: 6.5 g/dL (ref 6.5–8.1)

## 2019-11-30 LAB — IRON AND TIBC
Iron: 47 ug/dL (ref 41–142)
Saturation Ratios: 22 % (ref 21–57)
TIBC: 215 ug/dL — ABNORMAL LOW (ref 236–444)
UIBC: 168 ug/dL (ref 120–384)

## 2019-11-30 LAB — CBC WITH DIFFERENTIAL/PLATELET
Abs Immature Granulocytes: 0.03 10*3/uL (ref 0.00–0.07)
Basophils Absolute: 0.1 10*3/uL (ref 0.0–0.1)
Basophils Relative: 1 %
Eosinophils Absolute: 0.2 10*3/uL (ref 0.0–0.5)
Eosinophils Relative: 2 %
HCT: 30 % — ABNORMAL LOW (ref 36.0–46.0)
Hemoglobin: 9.4 g/dL — ABNORMAL LOW (ref 12.0–15.0)
Immature Granulocytes: 0 %
Lymphocytes Relative: 5 %
Lymphs Abs: 0.4 10*3/uL — ABNORMAL LOW (ref 0.7–4.0)
MCH: 33 pg (ref 26.0–34.0)
MCHC: 31.3 g/dL (ref 30.0–36.0)
MCV: 105.3 fL — ABNORMAL HIGH (ref 80.0–100.0)
Monocytes Absolute: 0.8 10*3/uL (ref 0.1–1.0)
Monocytes Relative: 10 %
Neutro Abs: 7.2 10*3/uL (ref 1.7–7.7)
Neutrophils Relative %: 82 %
Platelets: 283 10*3/uL (ref 150–400)
RBC: 2.85 MIL/uL — ABNORMAL LOW (ref 3.87–5.11)
RDW: 13.3 % (ref 11.5–15.5)
WBC: 8.7 10*3/uL (ref 4.0–10.5)
nRBC: 0 % (ref 0.0–0.2)

## 2019-11-30 LAB — TSH: TSH: 0.286 u[IU]/mL — ABNORMAL LOW (ref 0.450–4.500)

## 2019-11-30 LAB — FERRITIN: Ferritin: 1546 ng/mL — ABNORMAL HIGH (ref 11–307)

## 2019-11-30 LAB — SAMPLE TO BLOOD BANK

## 2019-11-30 MED ORDER — SODIUM CHLORIDE 0.9 % IV SOLN
750.0000 mg | Freq: Once | INTRAVENOUS | Status: AC
  Administered 2019-11-30: 750 mg via INTRAVENOUS
  Filled 2019-11-30: qty 15

## 2019-11-30 MED ORDER — SODIUM CHLORIDE 0.9 % IV SOLN
INTRAVENOUS | Status: DC
  Filled 2019-11-30: qty 250

## 2019-11-30 NOTE — Patient Instructions (Signed)

## 2019-12-03 ENCOUNTER — Other Ambulatory Visit: Payer: Self-pay | Admitting: Family Medicine

## 2019-12-04 NOTE — Addendum Note (Signed)
Addended by: [name] on: 12/04/2019 11:30 AM   Modules accepted: Orders

## 2019-12-12 ENCOUNTER — Other Ambulatory Visit: Payer: Self-pay | Admitting: Family Medicine

## 2019-12-24 ENCOUNTER — Other Ambulatory Visit: Payer: Medicare HMO

## 2019-12-24 ENCOUNTER — Other Ambulatory Visit: Payer: Self-pay

## 2019-12-24 ENCOUNTER — Other Ambulatory Visit: Payer: Self-pay | Admitting: *Deleted

## 2019-12-24 DIAGNOSIS — M810 Age-related osteoporosis without current pathological fracture: Secondary | ICD-10-CM

## 2019-12-24 MED ORDER — DENOSUMAB 60 MG/ML ~~LOC~~ SOSY
60.0000 mg | PREFILLED_SYRINGE | Freq: Once | SUBCUTANEOUS | Status: AC
  Administered 2019-12-24: 60 mg via SUBCUTANEOUS

## 2020-01-25 ENCOUNTER — Telehealth: Payer: Self-pay | Admitting: Medical

## 2020-01-25 NOTE — Telephone Encounter (Signed)
Received fax message from nurse Karren Cobble at [facility].  She has 3+ edema left lower ext, 2+ edema right lower ext.   She apparently just moved into [facility] assisted living.   She is already on Lasix 40mg  daily and Spironolactone.  She is not having wheezing ,SOB, chest pain.  I tried to give verbal order to add 20mg  extra Lasix in afternoon over weekend , but they wouldn't take verbal, and I have no way to send order at this point.    I advised leg elevated, and call 911 for ED eval if worse symptoms of CHF over weekend.  otherwise I advised we would call Monday for updates and potential orders.

## 2020-01-28 ENCOUNTER — Telehealth: Payer: Self-pay

## 2020-01-28 NOTE — Addendum Note (Signed)
Addended by: [name] on: 01/28/2020 02:59 PM   Modules accepted: Orders

## 2020-01-28 NOTE — Telephone Encounter (Signed)
Pt nurses called advising office that she is having a hard time with the move. Pt was screaming and crying that she want to go home. Pt couldn't be redirected. Pt is currentley on Celexa. Per your call Xanax will be sent in. [facility]

## 2020-01-29 ENCOUNTER — Telehealth: Payer: Self-pay | Admitting: *Deleted

## 2020-01-29 NOTE — Telephone Encounter (Addendum)
Son [name] called to cancel/discuss pt 5/12 appointments. Mother moved to assisted living last week and due to short term memory concerns is having difficulty with move. Son does not want to upset his mother more by bringing her to appointment (he must provide transportation as facility does not transport residents to [facility]) as he knows she will expect to return to her home. Richard states facility will transport to [facility] and wanted to know if it would be ok with Dr. [name] for his mother to go there. Dr. [name] informed. Per Dr. [name] - referral to [facility] made for patient given current residence and transportation difficulty.  Contacted [employer] - spoke with referral coordinator [place]. She requested following records faxed to [phone]: Pt demographics, Ins info, First OV with Dr.Kale (12/08/2015), last OV with Dr.Kale (10/03/19) and representative OV notes in between, first and last labs.

## 2020-01-30 ENCOUNTER — Ambulatory Visit: Payer: Medicare HMO

## 2020-01-30 ENCOUNTER — Telehealth: Payer: Self-pay

## 2020-01-30 ENCOUNTER — Ambulatory Visit: Payer: Medicare HMO | Admitting: Hematology

## 2020-01-30 ENCOUNTER — Other Ambulatory Visit: Payer: Medicare HMO

## 2020-01-30 MED ORDER — ALPRAZOLAM 0.25 MG PO TABS: 0.2500 mg | ORAL_TABLET | Freq: Two times a day (BID) | ORAL | 0 refills | Status: DC | PRN

## 2020-01-30 NOTE — Telephone Encounter (Signed)
Sent to [facility]

## 2020-01-30 NOTE — Telephone Encounter (Signed)
As of Monday evening pt complain of swelling pain in legs more left leg. They have not gotten the xanax script that was sent to [facility]. Wants to increase laxis or add support stockings. Please advise [facility]

## 2020-01-30 NOTE — Telephone Encounter (Signed)
Increase the Lasix by 1 pill and use support stockings

## 2020-01-30 NOTE — Telephone Encounter (Signed)
Increase had to be called in to pharmacy spoke christine . [place]

## 2020-01-30 NOTE — Telephone Encounter (Signed)
Pharmacy does not have pt xanax script that was supposed to be sent in yesterday. Please advise [facility]

## 2020-01-31 ENCOUNTER — Telehealth: Payer: Self-pay

## 2020-01-31 MED ORDER — ONDANSETRON 4 MG PO TBDP: 4.0000 mg | ORAL_TABLET | Freq: Three times a day (TID) | ORAL | 0 refills | Status: DC | PRN

## 2020-01-31 NOTE — Telephone Encounter (Signed)
01/31/20: Faxed notes as requested to [facility]. Fax confirmation received.

## 2020-01-31 NOTE — Telephone Encounter (Signed)
Meds sent

## 2020-01-31 NOTE — Telephone Encounter (Signed)
Connie [name] from [facility] called advising she has been vomiting all morning. They are [facility] and [facility] will contact son to go pick it up. Please advise [facility]

## 2020-01-31 NOTE — Telephone Encounter (Signed)
Per our conversation. Ok to send in Zofran 4 mg ODT for her.

## 2020-02-04 ENCOUNTER — Telehealth: Payer: Self-pay

## 2020-02-04 NOTE — Telephone Encounter (Signed)
Done

## 2020-02-04 NOTE — Telephone Encounter (Signed)
[facility] needs a script written for Ted hose stockings. Please advise when done so order can be faxed to them . Fax number is [phone] attn to [place]. [place]

## 2020-02-07 ENCOUNTER — Other Ambulatory Visit: Payer: Self-pay | Admitting: Family Medicine

## 2020-02-07 NOTE — Telephone Encounter (Signed)
As of feb pt was taking sporolactone. Please advise I you want pt to continue due to the increase of lasix 01-30-20. [place]

## 2020-02-12 ENCOUNTER — Telehealth: Payer: Self-pay | Admitting: Family Medicine

## 2020-02-12 NOTE — Telephone Encounter (Signed)
I do not think it is necessary to check daily. They can check once a week. Find out what we need to do to convey that message to them and to the son

## 2020-02-12 NOTE — Telephone Encounter (Signed)
Pt's son called and states that Connie [name] is charging his mother $18 dollars a month to check her sugar once a day. He states his mother never checked her sugars before. He would like to know if this is necessary due to the expense. Please advise Richard at [phone].

## 2020-02-13 NOTE — Telephone Encounter (Signed)
Richard has been unable to reach [facility] to find out what needs to happen. He requested that new orders for one a week blood sugar testing over to them. Nanine Means dax is [phone] and phone is [phone].

## 2020-02-13 NOTE — Telephone Encounter (Signed)
Order was faxed over 02-13-20. Connie [name]

## 2020-02-13 NOTE — Telephone Encounter (Signed)
Take care of this 

## 2020-02-20 DIAGNOSIS — E538 Deficiency of other specified B group vitamins: Secondary | ICD-10-CM | POA: Diagnosis not present

## 2020-02-20 DIAGNOSIS — D649 Anemia, unspecified: Secondary | ICD-10-CM | POA: Diagnosis not present

## 2020-02-26 ENCOUNTER — Telehealth: Payer: Self-pay

## 2020-02-26 NOTE — Telephone Encounter (Signed)
[facility] requested that pt celexa and spironolactone be filled. Per [employer] were sent over to facility. [place]

## 2020-03-02 ENCOUNTER — Other Ambulatory Visit: Payer: Self-pay | Admitting: Family Medicine

## 2020-03-02 DIAGNOSIS — G3184 Mild cognitive impairment, so stated: Secondary | ICD-10-CM

## 2020-03-03 NOTE — Telephone Encounter (Signed)
[facility] is requesting to fill pt aricept. Please advise [facility] 

## 2020-03-05 ENCOUNTER — Other Ambulatory Visit: Payer: Self-pay

## 2020-03-05 DIAGNOSIS — G3184 Mild cognitive impairment, so stated: Secondary | ICD-10-CM

## 2020-03-05 MED ORDER — DONEPEZIL HCL 5 MG PO TABS: ORAL_TABLET | ORAL | 1 refills | Status: AC

## 2020-03-07 DIAGNOSIS — D631 Anemia in chronic kidney disease: Secondary | ICD-10-CM | POA: Diagnosis not present

## 2020-03-07 DIAGNOSIS — N189 Chronic kidney disease, unspecified: Secondary | ICD-10-CM | POA: Diagnosis not present

## 2020-03-07 DIAGNOSIS — D649 Anemia, unspecified: Secondary | ICD-10-CM | POA: Diagnosis not present

## 2020-03-10 ENCOUNTER — Telehealth: Payer: Self-pay

## 2020-03-10 NOTE — Telephone Encounter (Signed)
THE SCRIPTS THAT WERE SENT IN ON THE NINTH WERE NOT EXCEPTED DUE TO NO DATE OR DIRECTION. AND TED HOSE NEED A NEW SCRIPT STATING APPLY TED HOSE I  THE AM AND REMOVE IN THE PM. tHE MED GO TO [facility] AND TED HOSE HAS TO WRITTEN AND FAXED TO [phone]. kh

## 2020-03-10 NOTE — Telephone Encounter (Signed)
NOTE SENT BACK TO DR. Redmond School kh

## 2020-03-10 NOTE — Telephone Encounter (Signed)
A nurse from [facility] called stating that they prescription for the pts. Spironolactone 25mg  and Celexa 10mg  was unreadable if you could fax over a new the is more clear.

## 2020-03-10 NOTE — Telephone Encounter (Signed)
Fax number is [phone].

## 2020-04-21 DIAGNOSIS — Z1152 Encounter for screening for COVID-19: Secondary | ICD-10-CM | POA: Diagnosis not present

## 2020-05-08 ENCOUNTER — Other Ambulatory Visit: Payer: Self-pay | Admitting: Family Medicine

## 2020-05-08 ENCOUNTER — Telehealth: Payer: Self-pay | Admitting: Family Medicine

## 2020-05-08 MED ORDER — ALPRAZOLAM 0.25 MG PO TABS: 0.2500 mg | ORAL_TABLET | Freq: Two times a day (BID) | ORAL | 1 refills | Status: AC | PRN

## 2020-05-08 NOTE — Telephone Encounter (Signed)
Recv'd message from [place] from [facility] states they need hard script faxed for Xanax .25 q12h prn anxiety, pt only has 1 dose left.  [phone] fax # [phone].

## 2020-05-13 NOTE — Telephone Encounter (Signed)
It was faxed last week. It should be in the scan center area. [place]

## 2020-05-13 NOTE — Telephone Encounter (Signed)
Maudie Mercury do you know if this was taken care of?  Thanks

## 2020-05-14 ENCOUNTER — Emergency Department (HOSPITAL_COMMUNITY): Payer: Medicare HMO

## 2020-05-14 ENCOUNTER — Other Ambulatory Visit: Payer: Self-pay

## 2020-05-14 ENCOUNTER — Encounter (HOSPITAL_COMMUNITY): Payer: Self-pay | Admitting: *Deleted

## 2020-05-14 ENCOUNTER — Telehealth: Payer: Self-pay | Admitting: Family Medicine

## 2020-05-14 ENCOUNTER — Inpatient Hospital Stay (HOSPITAL_COMMUNITY): Payer: Medicare HMO

## 2020-05-14 ENCOUNTER — Inpatient Hospital Stay (HOSPITAL_COMMUNITY)
Admission: EM | Admit: 2020-05-14 | Discharge: 2020-05-25 | DRG: 682 | Disposition: A | Discharge status: Nursing Home (Includes ICF) | Payer: Medicare HMO | Source: Skilled Nursing Facility | Attending: Internal Medicine | Admitting: Internal Medicine

## 2020-05-14 DIAGNOSIS — E890 Postprocedural hypothyroidism: Secondary | ICD-10-CM | POA: Diagnosis present

## 2020-05-14 DIAGNOSIS — G319 Degenerative disease of nervous system, unspecified: Secondary | ICD-10-CM | POA: Diagnosis not present

## 2020-05-14 DIAGNOSIS — M79645 Pain in left finger(s): Secondary | ICD-10-CM | POA: Diagnosis not present

## 2020-05-14 DIAGNOSIS — M109 Gout, unspecified: Secondary | ICD-10-CM | POA: Diagnosis present

## 2020-05-14 DIAGNOSIS — R4182 Altered mental status, unspecified: Secondary | ICD-10-CM | POA: Diagnosis not present

## 2020-05-14 DIAGNOSIS — D500 Iron deficiency anemia secondary to blood loss (chronic): Secondary | ICD-10-CM | POA: Diagnosis not present

## 2020-05-14 DIAGNOSIS — K921 Melena: Secondary | ICD-10-CM | POA: Diagnosis present

## 2020-05-14 DIAGNOSIS — I5042 Chronic combined systolic (congestive) and diastolic (congestive) heart failure: Secondary | ICD-10-CM | POA: Diagnosis not present

## 2020-05-14 DIAGNOSIS — Z96653 Presence of artificial knee joint, bilateral: Secondary | ICD-10-CM | POA: Diagnosis present

## 2020-05-14 DIAGNOSIS — R195 Other fecal abnormalities: Secondary | ICD-10-CM | POA: Diagnosis not present

## 2020-05-14 DIAGNOSIS — R5383 Other fatigue: Secondary | ICD-10-CM | POA: Diagnosis present

## 2020-05-14 DIAGNOSIS — T675XXA Heat exhaustion, unspecified, initial encounter: Secondary | ICD-10-CM | POA: Diagnosis not present

## 2020-05-14 DIAGNOSIS — E1169 Type 2 diabetes mellitus with other specified complication: Secondary | ICD-10-CM | POA: Diagnosis not present

## 2020-05-14 DIAGNOSIS — E875 Hyperkalemia: Secondary | ICD-10-CM | POA: Diagnosis not present

## 2020-05-14 DIAGNOSIS — K31819 Angiodysplasia of stomach and duodenum without bleeding: Secondary | ICD-10-CM | POA: Diagnosis not present

## 2020-05-14 DIAGNOSIS — N390 Urinary tract infection, site not specified: Secondary | ICD-10-CM | POA: Diagnosis present

## 2020-05-14 DIAGNOSIS — R001 Bradycardia, unspecified: Secondary | ICD-10-CM | POA: Diagnosis present

## 2020-05-14 DIAGNOSIS — D509 Iron deficiency anemia, unspecified: Secondary | ICD-10-CM | POA: Diagnosis present

## 2020-05-14 DIAGNOSIS — N19 Unspecified kidney failure: Secondary | ICD-10-CM

## 2020-05-14 DIAGNOSIS — R52 Pain, unspecified: Secondary | ICD-10-CM

## 2020-05-14 DIAGNOSIS — I130 Hypertensive heart and chronic kidney disease with heart failure and stage 1 through stage 4 chronic kidney disease, or unspecified chronic kidney disease: Secondary | ICD-10-CM | POA: Diagnosis present

## 2020-05-14 DIAGNOSIS — N179 Acute kidney failure, unspecified: Secondary | ICD-10-CM | POA: Diagnosis not present

## 2020-05-14 DIAGNOSIS — Z79899 Other long term (current) drug therapy: Secondary | ICD-10-CM

## 2020-05-14 DIAGNOSIS — Z20822 Contact with and (suspected) exposure to covid-19: Secondary | ICD-10-CM | POA: Diagnosis not present

## 2020-05-14 DIAGNOSIS — N3000 Acute cystitis without hematuria: Secondary | ICD-10-CM | POA: Diagnosis not present

## 2020-05-14 DIAGNOSIS — I428 Other cardiomyopathies: Secondary | ICD-10-CM | POA: Diagnosis present

## 2020-05-14 DIAGNOSIS — M064 Inflammatory polyarthropathy: Secondary | ICD-10-CM | POA: Diagnosis present

## 2020-05-14 DIAGNOSIS — I4891 Unspecified atrial fibrillation: Secondary | ICD-10-CM | POA: Diagnosis not present

## 2020-05-14 DIAGNOSIS — D649 Anemia, unspecified: Secondary | ICD-10-CM

## 2020-05-14 DIAGNOSIS — E119 Type 2 diabetes mellitus without complications: Secondary | ICD-10-CM

## 2020-05-14 DIAGNOSIS — N1831 Chronic kidney disease, stage 3a: Secondary | ICD-10-CM | POA: Diagnosis present

## 2020-05-14 DIAGNOSIS — E872 Acidosis, unspecified: Secondary | ICD-10-CM

## 2020-05-14 DIAGNOSIS — K219 Gastro-esophageal reflux disease without esophagitis: Secondary | ICD-10-CM | POA: Diagnosis present

## 2020-05-14 DIAGNOSIS — E1122 Type 2 diabetes mellitus with diabetic chronic kidney disease: Secondary | ICD-10-CM | POA: Diagnosis present

## 2020-05-14 DIAGNOSIS — B9620 Unspecified Escherichia coli [E. coli] as the cause of diseases classified elsewhere: Secondary | ICD-10-CM | POA: Diagnosis present

## 2020-05-14 DIAGNOSIS — I959 Hypotension, unspecified: Secondary | ICD-10-CM | POA: Diagnosis not present

## 2020-05-14 DIAGNOSIS — I10 Essential (primary) hypertension: Secondary | ICD-10-CM | POA: Diagnosis not present

## 2020-05-14 DIAGNOSIS — F0390 Unspecified dementia without behavioral disturbance: Secondary | ICD-10-CM | POA: Diagnosis present

## 2020-05-14 DIAGNOSIS — M659 Synovitis and tenosynovitis, unspecified: Secondary | ICD-10-CM | POA: Diagnosis present

## 2020-05-14 DIAGNOSIS — K31811 Angiodysplasia of stomach and duodenum with bleeding: Secondary | ICD-10-CM | POA: Diagnosis not present

## 2020-05-14 DIAGNOSIS — E039 Hypothyroidism, unspecified: Secondary | ICD-10-CM | POA: Diagnosis present

## 2020-05-14 DIAGNOSIS — I517 Cardiomegaly: Secondary | ICD-10-CM | POA: Diagnosis not present

## 2020-05-14 DIAGNOSIS — M85842 Other specified disorders of bone density and structure, left hand: Secondary | ICD-10-CM | POA: Diagnosis not present

## 2020-05-14 DIAGNOSIS — G9341 Metabolic encephalopathy: Secondary | ICD-10-CM | POA: Diagnosis not present

## 2020-05-14 DIAGNOSIS — E785 Hyperlipidemia, unspecified: Secondary | ICD-10-CM | POA: Diagnosis present

## 2020-05-14 DIAGNOSIS — R531 Weakness: Secondary | ICD-10-CM | POA: Diagnosis not present

## 2020-05-14 DIAGNOSIS — Z79890 Hormone replacement therapy (postmenopausal): Secondary | ICD-10-CM

## 2020-05-14 DIAGNOSIS — N189 Chronic kidney disease, unspecified: Secondary | ICD-10-CM | POA: Diagnosis not present

## 2020-05-14 LAB — URINALYSIS, ROUTINE W REFLEX MICROSCOPIC
Bilirubin Urine: NEGATIVE
Glucose, UA: NEGATIVE mg/dL
Hgb urine dipstick: NEGATIVE
Ketones, ur: NEGATIVE mg/dL
Nitrite: NEGATIVE
Protein, ur: NEGATIVE mg/dL
Specific Gravity, Urine: 1.01 (ref 1.005–1.030)
WBC, UA: >50 WBC/hpf — ABNORMAL HIGH (ref 0–5)
pH: 5 (ref 5.0–8.0)

## 2020-05-14 LAB — COMPREHENSIVE METABOLIC PANEL
ALT: 8 U/L (ref 0–44)
AST: 14 U/L — ABNORMAL LOW (ref 15–41)
Albumin: 3.6 g/dL (ref 3.5–5.0)
Alkaline Phosphatase: 32 U/L — ABNORMAL LOW (ref 38–126)
Anion gap: 9 (ref 5–15)
BUN: 112 mg/dL — ABNORMAL HIGH (ref 8–23)
CO2: 11 mmol/L — ABNORMAL LOW (ref 22–32)
Calcium: 8.2 mg/dL — ABNORMAL LOW (ref 8.9–10.3)
Chloride: 112 mmol/L — ABNORMAL HIGH (ref 98–111)
Creatinine, Ser: 4.08 mg/dL — ABNORMAL HIGH (ref 0.44–1.00)
GFR calc Af Amer: 11 mL/min — ABNORMAL LOW (ref >60)
GFR calc non Af Amer: 9 mL/min — ABNORMAL LOW (ref >60)
Glucose, Bld: 126 mg/dL — ABNORMAL HIGH (ref 70–99)
Potassium: 5.5 mmol/L — ABNORMAL HIGH (ref 3.5–5.1)
Sodium: 132 mmol/L — ABNORMAL LOW (ref 135–145)
Total Bilirubin: 0.7 mg/dL (ref 0.3–1.2)
Total Protein: 5.7 g/dL — ABNORMAL LOW (ref 6.5–8.1)

## 2020-05-14 LAB — CBC WITH DIFFERENTIAL/PLATELET
Abs Immature Granulocytes: 0.03 10*3/uL (ref 0.00–0.07)
Basophils Absolute: 0 10*3/uL (ref 0.0–0.1)
Basophils Relative: 0 %
Eosinophils Absolute: 0.1 10*3/uL (ref 0.0–0.5)
Eosinophils Relative: 2 %
HCT: 25.8 % — ABNORMAL LOW (ref 36.0–46.0)
Hemoglobin: 7.8 g/dL — ABNORMAL LOW (ref 12.0–15.0)
Immature Granulocytes: 1 %
Lymphocytes Relative: 8 %
Lymphs Abs: 0.4 10*3/uL — ABNORMAL LOW (ref 0.7–4.0)
MCH: 31.7 pg (ref 26.0–34.0)
MCHC: 30.2 g/dL (ref 30.0–36.0)
MCV: 104.9 fL — ABNORMAL HIGH (ref 80.0–100.0)
Monocytes Absolute: 0.7 10*3/uL (ref 0.1–1.0)
Monocytes Relative: 12 %
Neutro Abs: 4.4 10*3/uL (ref 1.7–7.7)
Neutrophils Relative %: 77 %
Platelets: 176 10*3/uL (ref 150–400)
RBC: 2.46 MIL/uL — ABNORMAL LOW (ref 3.87–5.11)
RDW: 13.9 % (ref 11.5–15.5)
WBC: 5.7 10*3/uL (ref 4.0–10.5)
nRBC: 0 % (ref 0.0–0.2)

## 2020-05-14 LAB — CBC
HCT: 25.6 % — ABNORMAL LOW (ref 36.0–46.0)
Hemoglobin: 7.4 g/dL — ABNORMAL LOW (ref 12.0–15.0)
MCH: 30.7 pg (ref 26.0–34.0)
MCHC: 28.9 g/dL — ABNORMAL LOW (ref 30.0–36.0)
MCV: 106.2 fL — ABNORMAL HIGH (ref 80.0–100.0)
Platelets: 149 10*3/uL — ABNORMAL LOW (ref 150–400)
RBC: 2.41 MIL/uL — ABNORMAL LOW (ref 3.87–5.11)
RDW: 13.7 % (ref 11.5–15.5)
WBC: 5.8 10*3/uL (ref 4.0–10.5)
nRBC: 0 % (ref 0.0–0.2)

## 2020-05-14 LAB — HEMOGLOBIN A1C
Hgb A1c MFr Bld: 5.9 % — ABNORMAL HIGH (ref 4.8–5.6)
Mean Plasma Glucose: 122.63 mg/dL

## 2020-05-14 LAB — TYPE AND SCREEN
ABO/RH(D): A POS
Antibody Screen: NEGATIVE

## 2020-05-14 LAB — CBG MONITORING, ED: Glucose-Capillary: 117 mg/dL — ABNORMAL HIGH (ref 70–99)

## 2020-05-14 LAB — SARS CORONAVIRUS 2 BY RT PCR (HOSPITAL ORDER, PERFORMED IN ~~LOC~~ HOSPITAL LAB): SARS Coronavirus 2: NEGATIVE

## 2020-05-14 LAB — POC OCCULT BLOOD, ED: Fecal Occult Bld: POSITIVE — AB

## 2020-05-14 MED ORDER — CITALOPRAM HYDROBROMIDE 20 MG PO TABS
10.0000 mg | ORAL_TABLET | Freq: Every day | ORAL | Status: DC
  Administered 2020-05-15 – 2020-05-25 (×11): 10 mg via ORAL
  Filled 2020-05-14 (×11): qty 1

## 2020-05-14 MED ORDER — DONEPEZIL HCL 5 MG PO TABS
5.0000 mg | ORAL_TABLET | Freq: Every day | ORAL | Status: DC
  Administered 2020-05-15 – 2020-05-24 (×11): 5 mg via ORAL
  Filled 2020-05-14 (×12): qty 1

## 2020-05-14 MED ORDER — SODIUM CHLORIDE 0.9 % IV SOLN
1.0000 g | Freq: Once | INTRAVENOUS | Status: AC
  Administered 2020-05-14: 1 g via INTRAVENOUS
  Filled 2020-05-14: qty 10

## 2020-05-14 MED ORDER — LEVOTHYROXINE SODIUM 25 MCG PO TABS
125.0000 ug | ORAL_TABLET | Freq: Every day | ORAL | Status: DC
  Administered 2020-05-15 – 2020-05-25 (×11): 125 ug via ORAL
  Filled 2020-05-14 (×11): qty 1

## 2020-05-14 MED ORDER — ATORVASTATIN CALCIUM 40 MG PO TABS
40.0000 mg | ORAL_TABLET | Freq: Every day | ORAL | Status: DC
  Administered 2020-05-15 – 2020-05-25 (×11): 40 mg via ORAL
  Filled 2020-05-14 (×11): qty 1

## 2020-05-14 MED ORDER — ACETAMINOPHEN 325 MG PO TABS
650.0000 mg | ORAL_TABLET | Freq: Four times a day (QID) | ORAL | Status: DC | PRN
  Administered 2020-05-17 – 2020-05-18 (×2): 650 mg via ORAL
  Filled 2020-05-14 (×2): qty 2

## 2020-05-14 MED ORDER — POLYETHYLENE GLYCOL 3350 17 G PO PACK: 17.0000 g | PACK | Freq: Every day | ORAL | Status: DC | PRN

## 2020-05-14 MED ORDER — CARVEDILOL 3.125 MG PO TABS
6.2500 mg | ORAL_TABLET | Freq: Two times a day (BID) | ORAL | Status: DC
  Administered 2020-05-15: 6.25 mg via ORAL
  Filled 2020-05-14: qty 2

## 2020-05-14 MED ORDER — SODIUM CHLORIDE 0.9 % IV BOLUS
1000.0000 mL | Freq: Once | INTRAVENOUS | Status: AC
  Administered 2020-05-14: 1000 mL via INTRAVENOUS

## 2020-05-14 MED ORDER — SODIUM CHLORIDE 0.9 % IV SOLN
1.0000 g | INTRAVENOUS | Status: DC
  Administered 2020-05-15 – 2020-05-17 (×3): 1 g via INTRAVENOUS
  Filled 2020-05-14: qty 1
  Filled 2020-05-14 (×3): qty 10

## 2020-05-14 MED ORDER — SODIUM CHLORIDE 0.9 % IV SOLN: INTRAVENOUS | Status: DC

## 2020-05-14 MED ORDER — ACETAMINOPHEN 650 MG RE SUPP: 650.0000 mg | Freq: Four times a day (QID) | RECTAL | Status: DC | PRN

## 2020-05-14 MED ORDER — ENOXAPARIN SODIUM 30 MG/0.3ML ~~LOC~~ SOLN
30.0000 mg | SUBCUTANEOUS | Status: DC
  Administered 2020-05-14 – 2020-05-20 (×7): 30 mg via SUBCUTANEOUS
  Filled 2020-05-14 (×7): qty 0.3

## 2020-05-14 MED ORDER — PANTOPRAZOLE SODIUM 40 MG IV SOLR
40.0000 mg | Freq: Two times a day (BID) | INTRAVENOUS | Status: DC
  Administered 2020-05-14 – 2020-05-19 (×10): 40 mg via INTRAVENOUS
  Filled 2020-05-14 (×10): qty 40

## 2020-05-14 MED ORDER — INSULIN ASPART 100 UNIT/ML ~~LOC~~ SOLN
0.0000 [IU] | Freq: Three times a day (TID) | SUBCUTANEOUS | Status: DC
  Administered 2020-05-15: 3 [IU] via SUBCUTANEOUS
  Administered 2020-05-16: 2 [IU] via SUBCUTANEOUS
  Administered 2020-05-16: 3 [IU] via SUBCUTANEOUS
  Administered 2020-05-17 – 2020-05-20 (×6): 2 [IU] via SUBCUTANEOUS
  Administered 2020-05-20: 3 [IU] via SUBCUTANEOUS
  Administered 2020-05-20: 5 [IU] via SUBCUTANEOUS
  Administered 2020-05-20 – 2020-05-21 (×2): 3 [IU] via SUBCUTANEOUS
  Administered 2020-05-21: 5 [IU] via SUBCUTANEOUS
  Administered 2020-05-21 – 2020-05-24 (×3): 3 [IU] via SUBCUTANEOUS
  Administered 2020-05-24: 5 [IU] via SUBCUTANEOUS
  Administered 2020-05-25: 2 [IU] via SUBCUTANEOUS

## 2020-05-14 MED ORDER — ONDANSETRON HCL 4 MG PO TABS
4.0000 mg | ORAL_TABLET | Freq: Four times a day (QID) | ORAL | Status: DC | PRN
  Administered 2020-05-17: 4 mg via ORAL
  Filled 2020-05-14: qty 1

## 2020-05-14 MED ORDER — ONDANSETRON HCL 4 MG/2ML IJ SOLN: 4.0000 mg | Freq: Four times a day (QID) | INTRAMUSCULAR | Status: DC | PRN

## 2020-05-14 MED ORDER — SACUBITRIL-VALSARTAN 24-26 MG PO TABS
1.0000 | ORAL_TABLET | Freq: Two times a day (BID) | ORAL | Status: DC
  Administered 2020-05-15: 1 via ORAL
  Filled 2020-05-14 (×2): qty 1

## 2020-05-14 NOTE — Telephone Encounter (Signed)
Melissa called back I informed her what Dr Redmond School message said he, she states she had gotten worse since the message and she was going to send her to the ER

## 2020-05-14 NOTE — ED Notes (Signed)
Patient transported to CT 

## 2020-05-14 NOTE — ED Triage Notes (Signed)
Pt is here by [facility] from [place].  Pt was sent out due to not feeling well, pt had wanted to stay outside and they said she came in once and vomited x1 but wanted to go back out at that time.  Pt appears tanned and states that she usually sits outside.

## 2020-05-14 NOTE — H&P (Signed)
History and Physical    Connie [name] CNO:[id number] DOB: 02-08-1934 DOA: 05/14/2020  PCP: [name], MD  Patient coming from: [place]   Chief Complaint:  Chief Complaint  Patient presents with  . Fatigue     HPI:   86 year old female with past medical history of systolic and diastolic congestive heart failure (EF 55-60% 2018), hypothyroidism, diabetes mellitus type 2,  GI bleeding (2016 and 07/2018) with chronic iron deficiency anemia, hyperlipidemia, hypertension who presents to [facility] emergency department due to change in mental status.  Patient is an extremely poor historian and is unable to provide a history.  Furthermore, family (son who is POA) last saw patient approximately 1 week ago where she was felt to be acting normally but otherwise they are unable to provide any further history.  Patient is a resident of [facility] in [place].  According the emergency department staff and staff at the patient's facility, patient has exhibited progressively worsening confusion and odd behavior over the past several days.  Patient has been sitting outside for lengthy periods of time, not responding appropriately to directions and has also been exhibiting an extremely poor appetite over the past several days.  Furthermore, patient exhibited an episode of nonbilious vomiting on date of presentation.  There have been no reports of fevers, diarrhea, cough, shortness of breath.  Upon evaluation in the emergency department, patient was found to have acute kidney injury with BUN of 112 and creatinine of 4.  Furthermore, patient is been found to have a concurrent mild hyperkalemia 5.5.  Patient is also found to have a urinalysis suggestive of urinary tract infection as well as a drop in hemoglobin to 7.8 compared to 9.4 in the past.  Stool Hemoccult was then performed and found to be positive.  1 L normal saline was provided by EMS in  route with an additional 1 L provided in the emergency department.  Patient is felt to be suffering from acute kidney injury thought to be secondary to urinary to a complicated urinary tract infection and therefore the hospitalist group has been called to assess patient for admission to the hospital.   Review of Systems: Review of Systems  Constitutional: Positive for weight loss.  Gastrointestinal: Positive for vomiting.  Neurological:       Confusion  All other systems reviewed and are negative.     Past Medical History:  Diagnosis Date  . Allergy    RHINITIS  . Angiodysplasia of duodenum    hx/notes 11/21/2015  . Angiodysplasia of stomach    hx/notes 11/21/2015  . Arthritis    "joints ache"  . Bleeding gastric ulcer    hx/notes 11/21/2015  . Chronic blood loss anemia    secondary to angiodysplasia of the stomach and duodenum as well as history of bleeding gastric ulcer hx/notes 11/21/2015  . Chronic systolic CHF (congestive heart failure) ([place]) 12/23/2015  . History of blood transfusion 01/2015; 06/2015; 11/21/2015  . Hypercholesteremia   . Hypertension   . Hypothyroidism   . Obesity   . Pneumonia "several times"  . Type II diabetes mellitus ([place])     Past Surgical History:  Procedure Laterality Date  . BIOPSY  07/24/2018   Procedure: BIOPSY;  Surgeon: Carol Ada, MD;  Location: [facility] ENDOSCOPY;  Service: Endoscopy;;  . CARDIAC CATHETERIZATION N/A 12/19/2015   Procedure: Left Heart Cath and Coronary Angiography;  Surgeon: Leonie Man, MD;  Location: [place] CV LAB;  Service:  Cardiovascular;  Laterality: N/A;  . COLONOSCOPY N/A 08/30/2014   Procedure: COLONOSCOPY;  Surgeon: Beryle Beams, MD;  Location: [facility] ENDOSCOPY;  Service: Endoscopy;  Laterality: N/A;  . COLONOSCOPY N/A 02/21/2015   Procedure: COLONOSCOPY;  Surgeon: Carol Ada, MD;  Location[facility] ENDOSCOPY;  Service: Endoscopy;  Laterality: N/A;  . COLONOSCOPY WITH PROPOFOL N/A 07/24/2018   Procedure: COLONOSCOPY WITH  PROPOFOL;  Surgeon: Carol Ada, MD;  Location: [facility] ENDOSCOPY;  Service: Endoscopy;  Laterality: N/A;  . ENTEROSCOPY N/A 06/06/2015   Procedure: ENTEROSCOPY;  Surgeon: Carol Ada, MD;  Location: [facility] ENDOSCOPY;  Service: Endoscopy;  Laterality: N/A;  . ENTEROSCOPY N/A 07/24/2018   Procedure: ENTEROSCOPY;  Surgeon: Carol Ada, MD;  Location: [facility] ENDOSCOPY;  Service: Endoscopy;  Laterality: N/A;  . FRACTURE SURGERY    . GIVENS CAPSULE STUDY N/A 05/15/2015   Procedure: GIVENS CAPSULE STUDY;  Surgeon: Carol Ada, MD;  Location: [facility] ENDOSCOPY;  Service: Endoscopy;  Laterality: N/A;  . HOT HEMOSTASIS N/A 08/30/2014   Procedure: HOT HEMOSTASIS (ARGON PLASMA COAGULATION/BICAP);  Surgeon: Beryle Beams, MD;  Location: [facility]OS[facility];  Service: Endoscopy;  Laterality: N/A;  . HOT HEMOSTASIS N/A 06/06/2015   Procedure: HOT HEMOSTASIS (ARGON PLASMA COAGULATION/BICAP);  Surgeon: Carol Ada, MD;  Location: [facility] ENDOSCOPY;  Service: Endoscopy;  Laterality: N/A;  . HOT HEMOSTASIS N/A 07/24/2018   Procedure: HOT HEMOSTASIS (ARGON PLASMA COAGULATION/BICAP);  Surgeon: Carol Ada, MD;  Location: [facility] ENDOSCOPY;  Service: Endoscopy;  Laterality: N/A;  . JOINT REPLACEMENT    . REVISION TOTAL KNEE ARTHROPLASTY Bilateral 2004-2015  . SHOULDER OPEN ROTATOR CUFF REPAIR Right 12/2004   open subacromial decompression, distal clavicle  resection, rotator cuff repair/notes 02/02/2011  . TONSILLECTOMY  1940s  . TOTAL KNEE ARTHROPLASTY Bilateral ~ 2002  . TOTAL THYROIDECTOMY  ~ 1966     reports that she has never smoked. She has never used smokeless tobacco. She reports that she does not drink alcohol and does not use drugs.  No Known Allergies  Family History  Problem Relation Age of Onset  . Asthma Mother   . Ulcers Father   . Anemia Sister      Prior to Admission medications   Medication Sig Start Date End Date Taking? Authorizing Provider  ACCU-CHEK FASTCLIX LANCETS MISC 1 each by Does not apply route 2 (two)  times daily. DX E11.9 Patient not taking: Reported on 11/01/2019 04/26/17   [name], MD  acetaminophen (TYLENOL) 500 MG tablet Take 500 mg by mouth every 6 (six) hours as needed for mild pain or headache.     [provider]  ALPRAZolam Duanne Moron) 0.25 MG tablet Take 1 tablet (0.25 mg total) by mouth 2 (two) times daily as needed for anxiety. 05/08/20   [name], MD  amLODipine (NORVASC) 10 MG tablet TAKE 1 TABLET EVERY DAY 07/27/19   Henson, Vickie L, NP-C  amoxicillin (AMOXIL) 875 MG tablet TAKE 1 TABLET TWICE DAILY Patient not taking: Reported on 11/01/2019 08/31/19   [name], MD  atorvastatin (LIPITOR) 40 MG tablet TAKE 1 TABLET EVERY DAY 07/27/19   Henson, Vickie L, NP-C  Blood Glucose Monitoring Suppl (ACCU-CHEK GUIDE) w/Device KIT 1 each by Does not apply route 2 (two) times daily. DX E11.9 Patient not taking: Reported on 11/01/2019 04/26/17   [name], MD  Calcium Carb-Cholecalciferol (CALCIUM 1000 + D PO) Take 1 tablet by mouth.    [provider]  carvedilol (COREG) 12.5 MG tablet TAKE 1 TABLET BY MOUTH 2  TIMES DAILY WITH A MEAL. 07/24/19   Jerline Pain, MD  citalopram (CELEXA) 10 MG tablet Take 1 tablet (10 mg total) by mouth daily. 11/01/19   [name], MD  donepezil (ARICEPT) 5 MG tablet TAKE 1 TABLET BY MOUTH EVERYDAY AT BEDTIME 03/05/20   [name], MD  ENTRESTO 24-26 MG TAKE 1 TABLET TWICE DAILY 09/20/19   Jerline Pain, MD  furosemide (LASIX) 40 MG tablet TAKE 1 TABLET EVERY DAY 11/29/19   [name], MD  glucose blood (ACCU-CHEK GUIDE) test strip Patient to test 2 times daily DX E11.9 Use as instructed Patient not taking: Reported on 11/01/2019 04/26/17   [name], MD  Lancets Misc. (ACCU-CHEK FASTCLIX LANCET) KIT 1 each by Does not apply route 2 (two) times daily. DX E11.9 Patient not taking: Reported on 11/01/2019 04/26/17   [name], MD  levothyroxine (SYNTHROID) 125 MCG tablet TAKE 1 TABLET EVERY DAY 12/12/19    [name], MD  Multiple Vitamins-Minerals (WOMENS MULTIVITAMIN PLUS PO) Take 1 tablet by mouth daily. 01/15/16   [provider]  ondansetron (ZOFRAN ODT) 4 MG disintegrating tablet Take 1 tablet (4 mg total) by mouth every 8 (eight) hours as needed for nausea or vomiting. 01/31/20   Raenette Rover, Vickie L, NP-C  pantoprazole (PROTONIX) 20 MG tablet TAKE 1 TABLET EVERY DAY 12/03/19   [name], MD  pioglitazone (ACTOS) 30 MG tablet TAKE 1 TABLET EVERY DAY 10/31/19   [name], MD  potassium chloride (KLOR-CON) 10 MEQ tablet TAKE 1 TABLET TWICE DAILY 09/10/19   [name], MD  spironolactone (ALDACTONE) 25 MG tablet TAKE 1 TABLET BY MOUTH EVERY DAY 02/07/20   [name], MD    Physical Exam: Vitals:   05/14/20 1900 05/14/20 2000 05/14/20 2045 05/14/20 2052  BP: (!) 118/41  (!) 100/34 (!) 109/35  Pulse: (!) 48 (!) 54 (!) 52 (!) 49  Resp: _0 Temp:      TempSrc:      SpO2: 100% 99% 100% 100%  Weight:        Constitutional: Lethargic but arousable, oriented x1, not in acute distress. Skin: no rashes, no lesions, extremely poor skin turgor noted. Eyes: Pupils are equally reactive to light.  No evidence of scleral icterus or conjunctival pallor.  ENMT: Dry mucous membranes noted.  Posterior pharynx clear of any exudate or lesions.   Neck: normal, supple, no masses, no thyromegaly.  No evidence of jugular venous distension.   Respiratory: clear to auscultation bilaterally, no wheezing, no crackles. Normal respiratory effort. No accessory muscle use.  Cardiovascular: 3/6 systolic murmur noted over the aortic valve.  Regular rate and rhythm, no murmurs / rubs / gallops. No extremity edema. 2+ pedal pulses. No carotid bruits.  Chest:   Nontender without crepitus or deformity.   Back:   Nontender without crepitus or deformity. Abdomen: Notable generalized tenderness, abdomen soft.  No evidence of intra-abdominal masses.  Positive bowel sounds noted in all  quadrants.   Musculoskeletal: No joint deformity upper and lower extremities. Good ROM, no contractures. Normal muscle tone.  Neurologic: Lethargic but arousable, intermittently following commands, CN 2-12 grossly intact. Sensation intact, patient is moving all 4 extremities spontaneously patient is following all commands.  Patient is responsive to verbal stimuli.   Psychiatric: Patient exhibiting a flat affect.  Patient currently does not seem to possess insight as to her current situation.   Labs on Admission: I  have personally reviewed following labs and imaging studies -   CBC: Recent Labs  Lab 05/14/20 1830  WBC 5.7  NEUTROABS 4.4  HGB 7.8*  HCT 25.8*  MCV 104.9*  PLT 176   Basic Metabolic Panel: Recent Labs  Lab 05/14/20 1830  NA 132*  K 5.5*  CL 112*  CO2 11*  GLUCOSE 126*  BUN 112*  CREATININE 4.08*  CALCIUM 8.2*   GFR: CrCl cannot be calculated (Unknown ideal weight.). Liver Function Tests: Recent Labs  Lab 05/14/20 1830  AST 14*  ALT 8  ALKPHOS 32*  BILITOT 0.7  PROT 5.7*  ALBUMIN 3.6   No results for input(s): LIPASE, AMYLASE in the last 168 hours. No results for input(s): AMMONIA in the last 168 hours. Coagulation Profile: No results for input(s): INR, PROTIME in the last 168 hours. Cardiac Enzymes: No results for input(s): CKTOTAL, CKMB, CKMBINDEX, TROPONINI in the last 168 hours. BNP (last 3 results) No results for input(s): PROBNP in the last 8760 hours. HbA1C: No results for input(s): HGBA1C in the last 72 hours. CBG: Recent Labs  Lab 05/14/20 1918  GLUCAP 117*   Lipid Profile: No results for input(s): CHOL, HDL, LDLCALC, TRIG, CHOLHDL, LDLDIRECT in the last 72 hours. Thyroid Function Tests: No results for input(s): TSH, T4TOTAL, FREET4, T3FREE, THYROIDAB in the last 72 hours. Anemia Panel: No results for input(s): VITAMINB12, FOLATE, FERRITIN, TIBC, IRON, RETICCTPCT in the last 72 hours. Urine analysis:    Component Value Date/Time    COLORURINE YELLOW 05/14/2020 1855   APPEARANCEUR HAZY (A) 05/14/2020 1855   LABSPEC 1.010 05/14/2020 1855   PHURINE 5.0 05/14/2020 1855   GLUCOSEU NEGATIVE 05/14/2020 1855   HGBUR NEGATIVE 05/14/2020 1855   BILIRUBINUR NEGATIVE 05/14/2020 1855   BILIRUBINUR n 05/09/2015 1656   KETONESUR NEGATIVE 05/14/2020 1855   PROTEINUR NEGATIVE 05/14/2020 1855   UROBILINOGEN negative 05/09/2015 1656   UROBILINOGEN 0.2 02/19/2015 1823   NITRITE NEGATIVE 05/14/2020 1855   LEUKOCYTESUR LARGE (A) 05/14/2020 1855    Radiological Exams on Admission - Personally Reviewed: CT Head Wo Contrast  Result Date: 05/14/2020 CLINICAL DATA:  Mental status change EXAM: CT HEAD WITHOUT CONTRAST TECHNIQUE: Contiguous axial images were obtained from the base of the skull through the vertex without intravenous contrast. COMPARISON:  None. FINDINGS: Brain: No evidence of acute infarction, hemorrhage, hydrocephalus, extra-axial collection or mass lesion/mass effect.Mild atrophic changes and chronic white matter ischemic changes are noted. Vascular: No acute abnormality noted. Skull: Normal. Negative for fracture or focal lesion. Sinuses/Orbits: No acute finding. Other: None. Electronically Signed   By: Alcide Clever M.D.   On: 05/14/2020 20:36   DG Chest Portable 1 View  Result Date: 05/14/2020 CLINICAL DATA:  Weakness EXAM: PORTABLE CHEST 1 VIEW COMPARISON:  09/21/2018 FINDINGS: Cardiomegaly. Aortic atherosclerosis. Lungs clear. No effusions or edema. No acute bony abnormality. Degenerative changes in the shoulders. IMPRESSION: Cardiomegaly.  No active disease. Electronically Signed   By: Charlett Nose M.D.   On: 05/14/2020 18:05    EKG: Personally reviewed.  Rhythm is sinus bradycardia with heart rate of 55 bpm.  No dynamic ST segment changes appreciated.  Assessment/Plan Principal Problem:   Acute kidney injury [facility])   Patient exhibiting substantial acute kidney injury with creatinine of 4, increased from baseline  of 1.38 with concurrent increase in BUN to 112 from a baseline of 43.  Considering urinalysis suggestive of urinary tract infection, acute kidney injury may be secondary to complicated urinary tract infection.  Patient has received 1  L of isotonic fluids via EMS and an additional liter of isotonic fluids by the emergency department staff.  Will place patient on continued gentle intravenous hydration overnight  Strict input and output monitoring  Obtaining urine electrolytes  Obtaining bladder scan  Obtaining bilateral renal ultrasound  Monitor renal function electrolytes with serial chemistries  Treating concurrent suspected urinary tract infection with intravenous ceftriaxone  If renal function does not rapidly improve will consider nephrology consultation.  Active Problems:   Anemia   Patient is a longstanding known history of chronic iron deficiency anemia and has received intravenous iron infusions in the past  That being said, depending on the chronicity of patient's renal injury chronic renal disease could also be playing a component.  Furthermore, patient is also macrocytic.  Of note, patient has a history of multiple gastrointestinal bleeds in the past, notably and 2016 and again in 2019.  Last endoscopic work-up in 07/2018 revealed gastritis via the EGD in addition to nonbleeding colonic angiodysplastic lesions via colonoscopy.  Obtaining iron panel, folate, vitamin B12  Patient is Hemoccult positive in the emergency department.  We will continue to cycle CBCs every 6 hours overnight  Type and screen obtained we will transfuse if hemoglobin drops below 7.  Placing patient on Protonix 40 mg IV every 12 for now  Considering that there is a chronic component to this iron deficiency anemia  - treating underlying complicated urinary tract infection and acute kidney injury.  If hemoglobin and hematocrit downtrend requiring transfusion or patient develops gross bleeding will  obtain gastroenterology consultation.    Complicated UTI (urinary tract infection)   Please see assessment and plan above.    Acute metabolic encephalopathy   Thought to be secondary to uremia considering markedly elevated BUN with concurrent complicated UTI  Hydrating with intravenous fluids to manage acute kidney injury while concurrently treating underlying urinary tract infection  Obtaining B12, CT head, TSH  Monitoring for symptomatic improvement    Hyperkalemia   Mild hyperkalemia, likely secondary to degree of renal injury in combination with ongoing spironolactone use  Monitoring patient on telemetry  I fully expect this mild hyperkalemia to resolve with intravenous fluids.  Will monitor potassium levels with serial chemistries.  Holding home regimen of spironolactone.    Hypothyroidism   Continue home regimen of Synthroid  Obtain TSH    Chronic combined systolic and diastolic congestive heart failure ([name])   No evidence of cardiogenic volume overload at this time  Monitor closely as we volume resuscitate the patient.    GERD without esophagitis   On intravenous Protonix as noted above    Bradycardia   Substantial sinus bradycardia noted upon arrival, likely secondary to ongoing beta-blocker use  Considering low normal blood pressures and substantial bradycardia in the 40s, will hold tonight's dose of Coreg and restart tomorrow morning at a lower dose of 6.25 mg twice daily with parameters not to administer if heart rate is under 60.  Monitoring on telemetry    Hyperlipidemia associated with type 2 diabetes mellitus ([place])   Continue home regimen statin therapy    Metabolic acidosis  Notable substantial metabolic acidosis based on chemistry  Likely secondary to degree of renal injury  Obtain lactic acid level  We will treat underlying renal injury with intravenous fluids, monitor for improvement  If renal function does not rapidly improve  and acidosis persists, will consider bicarbonate administration later in the hospitalization.   Code Status:  Full code Family Communication: Discussed with son (  Richard) who has power of attorney and has been updated on plan of care.  Status is: Inpatient  Remains inpatient appropriate because:Altered mental status, Ongoing diagnostic testing needed not appropriate for outpatient work up, IV treatments appropriate due to intensity of illness or inability to take PO and Inpatient level of care appropriate due to severity of illness   Dispo: The patient is from: ALF              Anticipated d/c is to: ALF              Anticipated d/c date is: > 3 days              Patient currently is not medically stable to d/c.        Vernelle Emerald MD [facility] Pager [phone]  If 7PM-7AM, please contact night-coverage [email or web address] Use universal [place] password for that web site. If you do not have the password, please call the hospital operator.  05/14/2020, 9:54 PM

## 2020-05-14 NOTE — Telephone Encounter (Signed)
Unfortunately probably needs to go to the ER

## 2020-05-14 NOTE — Telephone Encounter (Signed)
Office was advised [facility]

## 2020-05-14 NOTE — ED Notes (Signed)
Pt tender lower abdomen. Bladder scanned 150 mL. Placed purwick to encourage pt to void. Dr. Cyd Silence informed.

## 2020-05-14 NOTE — ED Provider Notes (Signed)
[facility] EMERGENCY DEPARTMENT Provider Note   CSN: [id number] Arrival date & time: 05/14/20  1729     History Chief Complaint  Patient presents with  . Fatigue    Connie [name] is a 86 y.o. female with past medical history significant for iron deficiency anemia due to GI bleeding from multiple AVMs,, hypertension, hypothyroidism, type 2 diabetes. Not on anticoagulation. Had covid vaccinations. GI Dr. Benson Norway.  HPI Patient presents to emergency department today via EMS from [facility].  EMS was called because patient was not feeling well.  Per EMS patient had been sitting outside for a long period of time.  When the nursing staff tried to asked patient to come inside she came inside for only a few minutes had one episode of nonbloody nonbilious emesis.  Patient then tried to return to sitting outside. 1L NS given by EMS.  Staff from facility state patient has had decreased appetite and activity for the last x3 days.  She has been unable to be redirected which is new.  She has tried to spend all of her time outside. No witnessed falls.  Additional history obtained from patient son after ED arrival.  He states he last saw his mother x1 week ago.  She has like to sit in the sun for the last several months.  She spends most of her time sitting out side in the day.  He has talked to her on the phone and did not notice any acute changes.  He thinks she is at her mental baseline.     Past Medical History:  Diagnosis Date  . Allergy    RHINITIS  . Angiodysplasia of duodenum    hx/notes 11/21/2015  . Angiodysplasia of stomach    hx/notes 11/21/2015  . Arthritis    "joints ache"  . Bleeding gastric ulcer    hx/notes 11/21/2015  . Chronic blood loss anemia    secondary to angiodysplasia of the stomach and duodenum as well as history of bleeding gastric ulcer hx/notes 11/21/2015  . Chronic systolic CHF (congestive heart failure) ([place]) 12/23/2015  . History of blood  transfusion 01/2015; 06/2015; 11/21/2015  . Hypercholesteremia   . Hypertension   . Hypothyroidism   . Obesity   . Pneumonia "several times"  . Type II diabetes mellitus [facility])     Patient Active Problem List   Diagnosis Date Noted  . Acute kidney injury ([place]) 05/14/2020  . Edema 07/25/2019  . Hypocalcemia   . Symptomatic anemia   . Gastroesophageal reflux disease 06/14/2018  . Osteoporosis 05/25/2018  . History of cataract extraction 10/19/2017  . Aortic atherosclerosis ([place]) 10/19/2016  . Long-term use of high-risk medication 08/04/2016  . Coronary artery disease involving native heart without angina pectoris 08/03/2016  . NICM (nonischemic cardiomyopathy) ([place]) 08/03/2016  . Senile purpura ([place]) 02/02/2016  . Macular degeneration (senile) of retina 12/23/2015  . Chronic systolic CHF (congestive heart failure) ([place]) 12/23/2015  . Angiodysplasia of stomach and duodenum with hemorrhage 06/24/2015  . Iron deficiency anemia due to chronic blood loss 06/24/2015  . Type 2 diabetes with complication ([place])   . Obesity (BMI 30.0-34.9) 07/25/2014  . Hypertension associated with diabetes ([place]) 03/15/2012  . Hypothyroidism 03/15/2012  . Hyperlipidemia associated with type 2 diabetes mellitus ([place]) 03/15/2012    Past Surgical History:  Procedure Laterality Date  . BIOPSY  07/24/2018   Procedure: BIOPSY;  Surgeon: Carol Ada, MD;  Location: [facility] ENDOSCOPY;  Service: Endoscopy;;  . CARDIAC CATHETERIZATION N/A 12/19/2015  Procedure: Left Heart Cath and Coronary Angiography;  Surgeon: Leonie Man, MD;  Location: [facility] CV LAB;  Service: Cardiovascular;  Laterality: N/A;  . COLONOSCOPY N/A 08/30/2014   Procedure: COLONOSCOPY;  Surgeon: Beryle Beams, MD;  Location: [facility] ENDOSCOPY;  Service: Endoscopy;  Laterality: N/A;  . COLONOSCOPY N/A 02/21/2015   Procedure: COLONOSCOPY;  Surgeon: Carol Ada, MD;  Location: [facility] ENDOSCOPY;  Service: Endoscopy;  Laterality: N/A;  . COLONOSCOPY WITH  PROPOFOL N/A 07/24/2018   Procedure: COLONOSCOPY WITH PROPOFOL;  Surgeon: Carol Ada, MD;  Location: [facility] ENDOSCOPY;  Service: Endoscopy;  Laterality: N/A;  . ENTEROSCOPY N/A 06/06/2015   Procedure: ENTEROSCOPY;  Surgeon: Carol Ada, MD;  Location: [facility] ENDOSCOPY;  Service: Endoscopy;  Laterality: N/A;  . ENTEROSCOPY N/A 07/24/2018   Procedure: ENTEROSCOPY;  Surgeon: Carol Ada, MD;  Location: [facility] ENDOSCOPY;  Service: Endoscopy;  Laterality: N/A;  . FRACTURE SURGERY    . GIVENS CAPSULE STUDY N/A 05/15/2015   Procedure: GIVENS CAPSULE STUDY;  Surgeon: Carol Ada, MD;  Location: [facility] ENDOSCOPY;  Service: Endoscopy;  Laterality: N/A;  . HOT HEMOSTASIS N/A 08/30/2014   Procedure: HOT HEMOSTASIS (ARGON PLASMA COAGULATION/BICAP);  Surgeon: Beryle Beams, MD;  Location: [facility] ENDOSCOPY;  Service: Endoscopy;  Laterality: N/A;  . HOT HEMOSTASIS N/A 06/06/2015   Procedure: HOT HEMOSTASIS (ARGON PLASMA COAGULATION/BICAP);  Surgeon: Carol Ada, MD;  Location: [facility] ENDOSCOPY;  Service: Endoscopy;  Laterality: N/A;  . HOT HEMOSTASIS N/A 07/24/2018   Procedure: HOT HEMOSTASIS (ARGON PLASMA COAGULATION/BICAP);  Surgeon: Carol Ada, MD;  Location: [facility] ENDOSCOPY;  Service: Endoscopy;  Laterality: N/A;  . JOINT REPLACEMENT    . REVISION TOTAL KNEE ARTHROPLASTY Bilateral 2004-2015  . SHOULDER OPEN ROTATOR CUFF REPAIR Right 12/2004   open subacromial decompression, distal clavicle  resection, rotator cuff repair/notes 02/02/2011  . TONSILLECTOMY  1940s  . TOTAL KNEE ARTHROPLASTY Bilateral ~ 2002  . TOTAL THYROIDECTOMY  ~ 1966     OB History   No obstetric history on file.     Family History  Problem Relation Age of Onset  . Asthma Mother   . Ulcers Father   . Anemia Sister     Social History   Tobacco Use  . Smoking status: Never Smoker  . Smokeless tobacco: Never Used  Vaping Use  . Vaping Use: Never used  Substance Use Topics  . Alcohol use: No  . Drug use: No    Home Medications Prior to  Admission medications   Medication Sig Start Date End Date Taking? Authorizing Provider  ACCU-CHEK FASTCLIX LANCETS MISC 1 each by Does not apply route 2 (two) times daily. DX E11.9 Patient not taking: Reported on 11/01/2019 04/26/17   [name], MD  acetaminophen (TYLENOL) 500 MG tablet Take 500 mg by mouth every 6 (six) hours as needed for mild pain or headache.     [provider]  ALPRAZolam Duanne Moron) 0.25 MG tablet Take 1 tablet (0.25 mg total) by mouth 2 (two) times daily as needed for anxiety. 05/08/20   [name], MD  amLODipine (NORVASC) 10 MG tablet TAKE 1 TABLET EVERY DAY 07/27/19   Henson, Vickie L, NP-C  amoxicillin (AMOXIL) 875 MG tablet TAKE 1 TABLET TWICE DAILY Patient not taking: Reported on 11/01/2019 08/31/19   [name], MD  atorvastatin (LIPITOR) 40 MG tablet TAKE 1 TABLET EVERY DAY 07/27/19   Henson, Vickie L, NP-C  Blood Glucose Monitoring Suppl (ACCU-CHEK GUIDE) w/Device KIT 1 each by Does not apply route 2 (  two) times daily. DX E11.9 Patient not taking: Reported on 11/01/2019 04/26/17   [name], MD  Calcium Carb-Cholecalciferol (CALCIUM 1000 + D PO) Take 1 tablet by mouth.    [provider]  carvedilol (COREG) 12.5 MG tablet TAKE 1 TABLET BY MOUTH 2 TIMES DAILY WITH A MEAL. 07/24/19   Jerline Pain, MD  citalopram (CELEXA) 10 MG tablet Take 1 tablet (10 mg total) by mouth daily. 11/01/19   [name], MD  donepezil (ARICEPT) 5 MG tablet TAKE 1 TABLET BY MOUTH EVERYDAY AT BEDTIME 03/05/20   [name], MD  ENTRESTO 24-26 MG TAKE 1 TABLET TWICE DAILY 09/20/19   Jerline Pain, MD  furosemide (LASIX) 40 MG tablet TAKE 1 TABLET EVERY DAY 11/29/19   [name], MD  glucose blood (ACCU-CHEK GUIDE) test strip Patient to test 2 times daily DX E11.9 Use as instructed Patient not taking: Reported on 11/01/2019 04/26/17   [name], MD  Lancets Misc. (ACCU-CHEK FASTCLIX LANCET) KIT 1 each by Does not apply route 2 (two) times daily. DX  E11.9 Patient not taking: Reported on 11/01/2019 04/26/17   [name], MD  levothyroxine (SYNTHROID) 125 MCG tablet TAKE 1 TABLET EVERY DAY 12/12/19   [name], MD  Multiple Vitamins-Minerals (WOMENS MULTIVITAMIN PLUS PO) Take 1 tablet by mouth daily. 01/15/16   [provider]  ondansetron (ZOFRAN ODT) 4 MG disintegrating tablet Take 1 tablet (4 mg total) by mouth every 8 (eight) hours as needed for nausea or vomiting. 01/31/20   Raenette Rover, Vickie L, NP-C  pantoprazole (PROTONIX) 20 MG tablet TAKE 1 TABLET EVERY DAY 12/03/19   [name], MD  pioglitazone (ACTOS) 30 MG tablet TAKE 1 TABLET EVERY DAY 10/31/19   [name], MD  potassium chloride (KLOR-CON) 10 MEQ tablet TAKE 1 TABLET TWICE DAILY 09/10/19   [name], MD  spironolactone (ALDACTONE) 25 MG tablet TAKE 1 TABLET BY MOUTH EVERY DAY 02/07/20   [name], MD    Allergies    Patient has no known allergies.  Review of Systems   Review of Systems  Unable to perform ROS: Dementia    Physical Exam Updated Vital Signs BP (!) 106/47 (BP Location: Right Arm)   Pulse (!) 54   Temp (!) 97.5 F (36.4 C) (Oral)   Resp 12   Wt 52.2 kg   SpO2 100%   BMI 25.78 kg/m   Physical Exam Vitals and nursing note reviewed.  Constitutional:      General: She is not in acute distress.    Appearance: She is not ill-appearing.  HENT:     Head: Normocephalic and atraumatic.     Right Ear: Tympanic membrane and external ear normal.     Left Ear: Tympanic membrane and external ear normal.     Nose: Nose normal.     Mouth/Throat:     Mouth: Mucous membranes are dry.     Pharynx: Oropharynx is clear.  Eyes:     General: No scleral icterus.       Right eye: No discharge.        Left eye: No discharge.     Extraocular Movements: Extraocular movements intact.     Conjunctiva/sclera: Conjunctivae normal.     Pupils: Pupils are equal, round, and reactive to light.  Neck:     Vascular: No JVD.  Cardiovascular:       Rate and Rhythm: Normal rate and regular  rhythm.     Pulses: Normal pulses.          Radial pulses are 2+ on the right side and 2+ on the left side.     Heart sounds: Normal heart sounds.  Pulmonary:     Comments: Lungs clear to auscultation in all fields. Symmetric chest rise. No wheezing, rales, or rhonchi. Abdominal:     Comments: Abdomen is soft, non-distended, and non-tender in all quadrants. No rigidity, no guarding. No peritoneal signs.  Genitourinary:    Comments: Chaperone Dr. Roslynn Amble present for exam. Digital Rectal Exam reveals sphincter with good tone. No external hemorrhoids. No masses or fissures. Stool color is brown with no overt blood. No gross melena.  Musculoskeletal:        General: Normal range of motion.     Cervical back: Normal range of motion.  Skin:    General: Skin is warm and dry.     Capillary Refill: Capillary refill takes less than 2 seconds.  Neurological:     GCS: GCS eye subscore is 4. GCS verbal subscore is 5. GCS motor subscore is 6.     Comments: Fluent speech, no facial droop.  Patient is alert to self and location only.  Unaware of year or month. Has dementia, unsure of baseline.  Strong equal grip strength in bilateral upper and lower extremities.  Finger-to-nose intact.  Follows commands  Psychiatric:        Behavior: Behavior normal.     ED Results / Procedures / Treatments   Labs (all labs ordered are listed, but only abnormal results are displayed) Labs Reviewed  COMPREHENSIVE METABOLIC PANEL - Abnormal; Notable for the following components:      Result Value   Sodium 132 (*)    Potassium 5.5 (*)    Chloride 112 (*)    CO2 11 (*)    Glucose, Bld 126 (*)    BUN 112 (*)    Creatinine, Ser 4.08 (*)    Calcium 8.2 (*)    Total Protein 5.7 (*)    AST 14 (*)    Alkaline Phosphatase 32 (*)    GFR calc non Af Amer 9 (*)    GFR calc Af Amer 11 (*)    All other components within normal limits  CBC WITH DIFFERENTIAL/PLATELET -  Abnormal; Notable for the following components:   RBC 2.46 (*)    Hemoglobin 7.8 (*)    HCT 25.8 (*)    MCV 104.9 (*)    Lymphs Abs 0.4 (*)    All other components within normal limits  URINALYSIS, ROUTINE W REFLEX MICROSCOPIC - Abnormal; Notable for the following components:   APPearance HAZY (*)    Leukocytes,Ua LARGE (*)    WBC, UA >50 (*)    Bacteria, UA MANY (*)    Non Squamous Epithelial 0-5 (*)    All other components within normal limits  CBG MONITORING, ED - Abnormal; Notable for the following components:   Glucose-Capillary 117 (*)    All other components within normal limits  POC OCCULT BLOOD, ED - Abnormal; Notable for the following components:   Fecal Occult Bld POSITIVE (*)    All other components within normal limits  SARS CORONAVIRUS 2 BY RT PCR (HOSPITAL ORDER, [place] [facility])  URINE CULTURE  LIPASE, BLOOD  TYPE AND SCREEN    EKG None  Radiology CT Head Wo Contrast  Result Date: 05/14/2020 CLINICAL DATA:  Mental status change EXAM: CT HEAD  WITHOUT CONTRAST TECHNIQUE: Contiguous axial images were obtained from the base of the skull through the vertex without intravenous contrast. COMPARISON:  None. FINDINGS: Brain: No evidence of acute infarction, hemorrhage, hydrocephalus, extra-axial collection or mass lesion/mass effect.Mild atrophic changes and chronic white matter ischemic changes are noted. Vascular: No acute abnormality noted. Skull: Normal. Negative for fracture or focal lesion. Sinuses/Orbits: No acute finding. Other: None. Electronically Signed   By: Inez Catalina M.D.   On: 05/14/2020 20:36   DG Chest Portable 1 View  Result Date: 05/14/2020 CLINICAL DATA:  Weakness EXAM: PORTABLE CHEST 1 VIEW COMPARISON:  09/21/2018 FINDINGS: Cardiomegaly. Aortic atherosclerosis. Lungs clear. No effusions or edema. No acute bony abnormality. Degenerative changes in the shoulders. IMPRESSION: Cardiomegaly.  No active disease. Electronically Signed    By: Rolm Baptise M.D.   On: 05/14/2020 18:05    Procedures .Critical Care Performed by: Cherre Robins, PA-C Authorized by: Cherre Robins, PA-C   Critical care provider statement:    Critical care time (minutes):  34   Critical care time was exclusive of:  Separately billable procedures and treating other patients and teaching time   Critical care was necessary to treat or prevent imminent or life-threatening deterioration of the following conditions:  Renal failure   Critical care was time spent personally by me on the following activities:  Development of treatment plan with patient or surrogate, evaluation of patient's response to treatment, examination of patient, obtaining history from patient or surrogate, ordering and performing treatments and interventions, ordering and review of laboratory studies, ordering and review of radiographic studies, pulse oximetry, re-evaluation of patient's condition and review of old charts   I assumed direction of critical care for this patient from another provider in my specialty: no     (including critical care time)  Medications Ordered in ED Medications  cefTRIAXone (ROCEPHIN) 1 g in sodium chloride 0.9 % 100 mL IVPB (1 g Intravenous New Bag/Given 05/14/20 2017)  sodium chloride 0.9 % bolus 1,000 mL (1,000 mLs Intravenous New Bag/Given 05/14/20 2016)    ED Course  I have reviewed the triage vital signs and the nursing notes.  Pertinent labs & imaging results that were available during my care of the patient were reviewed by me and considered in my medical decision making (see chart for details).  Clinical Course as of May 14 2044  Wed May 14, 2020  1904 Patient ambulatory to bathroom with stand by assist   [KA]    Clinical Course User Index [KA] Cherre Robins, [place]   Vitals:   05/14/20 1800 05/14/20 1845 05/14/20 1900 05/14/20 2000  BP: (!) 100/34  (!) 118/41   Pulse: (!) 48 68 (!) 48 (!) 54  Resp: _0 Temp:       TempSrc:      SpO2: 99% 99% 100% 99%  Weight:        MDM Rules/Calculators/A&P                          History provided by patient and EMS with additional history obtained from chart review.    86 yo presenting with fatigue. Not at baseline per facility staff. On arrival to ED pressures are soft. She is afebrile and borderline bradycardic. She is alert to self and location only. Neuro exam without focal weakness. No abdominal tenderness.  She appears to be dehydrated, mucous membranes are dry.  CBG 117.  Rectal exam performed.  No gross melena.  Fecal occult is positive.  Labs show hemoglobin 7.8, baseline appears to be around 9-10. UA is suggestive of infection with laarge leukocytes and >50 WBC. Will send for culture and give rocephin.  CMP with multiple abnormalities.  Bicarb is 11, BUN and creatinine is elevated at 112/4.08.  This is much higher than her baseline which appears to be creatinine around 1.4.  She also is hyperkalemic with a potassium of 5.5.  Normal anion gap. 1 liter NS ordered, for a total of 2 Liters. Covid negative. CT head negative for acute findings. I viewed pt's chest xray and it does not suggest acute infectious processes. EKG without ischemic changes.  This case was discussed with Dr. Roslynn Amble who has seen the patient and agrees with plan to admit. I called patient's son Connie [name] to give update of treatment plan.Spoke with Dr. Cyd Silence with hospitalist service who agrees to assume care of patient and bring into the hospital for further evaluation and management.      Portions of this note were generated with [employer]. Dictation errors may occur despite best attempts at proofreading.       Final Clinical Impression(s) / ED Diagnoses Final diagnoses:  Uremia, acute    Rx / DC Orders ED Discharge Orders    None       Flint Melter 05/14/20 2045    Lucrezia Starch, MD 05/15/20 [phone]

## 2020-05-14 NOTE — Telephone Encounter (Signed)
Melissa from [facility] called and said pt has been really confused , not eating and she has been having chill. She also said pt has been sleeping a lot. Melissa wants to know should they schedule a visit here or should they send pt to the ER. Melissa can be reached at [phone]

## 2020-05-14 NOTE — ED Notes (Signed)
Patient transported to Ultrasound 

## 2020-05-14 NOTE — ED Notes (Signed)
Pt ambulated to the bathroom to void.  Pt was able to ambulate without difficulty

## 2020-05-15 LAB — C-REACTIVE PROTEIN: CRP: <0.5 mg/dL (ref <1.0)

## 2020-05-15 LAB — COMPREHENSIVE METABOLIC PANEL
ALT: 7 U/L (ref 0–44)
AST: 17 U/L (ref 15–41)
Albumin: 3.1 g/dL — ABNORMAL LOW (ref 3.5–5.0)
Alkaline Phosphatase: 32 U/L — ABNORMAL LOW (ref 38–126)
Anion gap: 11 (ref 5–15)
BUN: 96 mg/dL — ABNORMAL HIGH (ref 8–23)
CO2: 10 mmol/L — ABNORMAL LOW (ref 22–32)
Calcium: 7.3 mg/dL — ABNORMAL LOW (ref 8.9–10.3)
Chloride: 115 mmol/L — ABNORMAL HIGH (ref 98–111)
Creatinine, Ser: 3.24 mg/dL — ABNORMAL HIGH (ref 0.44–1.00)
GFR calc Af Amer: 14 mL/min — ABNORMAL LOW (ref >60)
GFR calc non Af Amer: 12 mL/min — ABNORMAL LOW (ref >60)
Glucose, Bld: 105 mg/dL — ABNORMAL HIGH (ref 70–99)
Potassium: 4.9 mmol/L (ref 3.5–5.1)
Sodium: 136 mmol/L (ref 135–145)
Total Bilirubin: 0.7 mg/dL (ref 0.3–1.2)
Total Protein: 5.1 g/dL — ABNORMAL LOW (ref 6.5–8.1)

## 2020-05-15 LAB — CBC
HCT: 26.4 % — ABNORMAL LOW (ref 36.0–46.0)
HCT: 27.3 % — ABNORMAL LOW (ref 36.0–46.0)
Hemoglobin: 7.7 g/dL — ABNORMAL LOW (ref 12.0–15.0)
Hemoglobin: 8.1 g/dL — ABNORMAL LOW (ref 12.0–15.0)
MCH: 30.8 pg (ref 26.0–34.0)
MCH: 30.8 pg (ref 26.0–34.0)
MCHC: 29.2 g/dL — ABNORMAL LOW (ref 30.0–36.0)
MCHC: 29.7 g/dL — ABNORMAL LOW (ref 30.0–36.0)
MCV: 105.6 fL — ABNORMAL HIGH (ref 80.0–100.0)
MCV: 103.8 fL — ABNORMAL HIGH (ref 80.0–100.0)
Platelets: 163 10*3/uL (ref 150–400)
Platelets: 173 10*3/uL (ref 150–400)
RBC: 2.5 MIL/uL — ABNORMAL LOW (ref 3.87–5.11)
RBC: 2.63 MIL/uL — ABNORMAL LOW (ref 3.87–5.11)
RDW: 13.8 % (ref 11.5–15.5)
RDW: 13.9 % (ref 11.5–15.5)
WBC: 6.5 10*3/uL (ref 4.0–10.5)
WBC: 7.9 10*3/uL (ref 4.0–10.5)
nRBC: 0 % (ref 0.0–0.2)
nRBC: 0 % (ref 0.0–0.2)

## 2020-05-15 LAB — BASIC METABOLIC PANEL
Anion gap: 11 (ref 5–15)
Anion gap: 12 (ref 5–15)
BUN: 96 mg/dL — ABNORMAL HIGH (ref 8–23)
BUN: 91 mg/dL — ABNORMAL HIGH (ref 8–23)
CO2: 11 mmol/L — ABNORMAL LOW (ref 22–32)
CO2: 11 mmol/L — ABNORMAL LOW (ref 22–32)
Calcium: 7.8 mg/dL — ABNORMAL LOW (ref 8.9–10.3)
Calcium: 7.6 mg/dL — ABNORMAL LOW (ref 8.9–10.3)
Chloride: 114 mmol/L — ABNORMAL HIGH (ref 98–111)
Chloride: 118 mmol/L — ABNORMAL HIGH (ref 98–111)
Creatinine, Ser: 3.27 mg/dL — ABNORMAL HIGH (ref 0.44–1.00)
Creatinine, Ser: 3.02 mg/dL — ABNORMAL HIGH (ref 0.44–1.00)
GFR calc Af Amer: 14 mL/min — ABNORMAL LOW (ref >60)
GFR calc Af Amer: 16 mL/min — ABNORMAL LOW (ref >60)
GFR calc non Af Amer: 12 mL/min — ABNORMAL LOW (ref >60)
GFR calc non Af Amer: 14 mL/min — ABNORMAL LOW (ref >60)
Glucose, Bld: 153 mg/dL — ABNORMAL HIGH (ref 70–99)
Glucose, Bld: 72 mg/dL (ref 70–99)
Potassium: 5.1 mmol/L (ref 3.5–5.1)
Potassium: 4.7 mmol/L (ref 3.5–5.1)
Sodium: 136 mmol/L (ref 135–145)
Sodium: 141 mmol/L (ref 135–145)

## 2020-05-15 LAB — PROTIME-INR
INR: 1.4 — ABNORMAL HIGH (ref 0.8–1.2)
Prothrombin Time: 16.5 seconds — ABNORMAL HIGH (ref 11.4–15.2)

## 2020-05-15 LAB — IRON AND TIBC
Iron: 52 ug/dL (ref 28–170)
Saturation Ratios: 24 % (ref 10.4–31.8)
TIBC: 213 ug/dL — ABNORMAL LOW (ref 250–450)
UIBC: 161 ug/dL

## 2020-05-15 LAB — MAGNESIUM: Magnesium: 1.8 mg/dL (ref 1.7–2.4)

## 2020-05-15 LAB — APTT: aPTT: 34 seconds (ref 24–36)

## 2020-05-15 LAB — CBG MONITORING, ED
Glucose-Capillary: 106 mg/dL — ABNORMAL HIGH (ref 70–99)
Glucose-Capillary: 154 mg/dL — ABNORMAL HIGH (ref 70–99)
Glucose-Capillary: 97 mg/dL (ref 70–99)
Glucose-Capillary: 90 mg/dL (ref 70–99)

## 2020-05-15 LAB — LIPASE, BLOOD: Lipase: 29 U/L (ref 11–51)

## 2020-05-15 LAB — TSH: TSH: 0.575 u[IU]/mL (ref 0.350–4.500)

## 2020-05-15 LAB — LACTIC ACID, PLASMA: Lactic Acid, Venous: 0.5 mmol/L (ref 0.5–1.9)

## 2020-05-15 LAB — VITAMIN B12: Vitamin B-12: 1487 pg/mL — ABNORMAL HIGH (ref 180–914)

## 2020-05-15 LAB — GLUCOSE, CAPILLARY
Glucose-Capillary: 64 mg/dL — ABNORMAL LOW (ref 70–99)
Glucose-Capillary: 97 mg/dL (ref 70–99)

## 2020-05-15 LAB — FOLATE: Folate: 20.5 ng/mL (ref >5.9)

## 2020-05-15 LAB — MRSA PCR SCREENING: MRSA by PCR: NEGATIVE

## 2020-05-15 MED ORDER — STERILE WATER FOR INJECTION IV SOLN
INTRAVENOUS | Status: DC
  Filled 2020-05-15 (×6): qty 9.62

## 2020-05-15 MED ORDER — CARVEDILOL 3.125 MG PO TABS
3.1250 mg | ORAL_TABLET | Freq: Two times a day (BID) | ORAL | Status: DC
  Administered 2020-05-16 – 2020-05-25 (×16): 3.125 mg via ORAL
  Filled 2020-05-15 (×16): qty 1

## 2020-05-15 NOTE — ED Notes (Signed)
Ordered breakfast 

## 2020-05-15 NOTE — Progress Notes (Addendum)
Progress Note    Connie [name]  NFA:[id number] DOB: 08/29/1934  DOA: 05/14/2020 PCP: [name], MD    Brief Narrative:    Medical records reviewed and are as summarized below:  Connie [name] is an 86 y.o. female with past medical history of systolic and diastolic congestive heart failure (EF 55-60% 2018), hypothyroidism, diabetes mellitus type 2,  GI bleeding (2016 and 07/2018) with chronic iron deficiency anemia, hyperlipidemia, hypertension who presents to [facility] emergency department due to change in mental status.  Assessment/Plan:   Principal Problem:   Acute kidney injury ([place]) Active Problems:   Hypothyroidism   Hyperlipidemia associated with type 2 diabetes mellitus ([place])   Controlled type 2 diabetes mellitus without complication, without long-term current use of insulin ([name])   Metabolic acidosis   Chronic combined systolic and diastolic congestive heart failure ([name])   GERD without esophagitis   Anemia   Complicated UTI (urinary tract infection)   Bradycardia   Acute metabolic encephalopathy   Hyperkalemia   Acute kidney injury due to UTI  substantial acute kidney injury with creatinine of 4, increased from baseline of 1.38   IVF  Strict input and output monitoring   renal ultrasound: medical renal  Treating urinary tract infection with intravenous ceftriaxone- culture pending  [place]. Her entresto was resumed in the ER    Anemia  Patient is a longstanding known history of chronic iron deficiency anemia and has received intravenous iron infusions in the past  Of note, patient has a history of multiple gastrointestinal bleeds in the past, notably and 2016 and again in 2019.  Last endoscopic work-up in 07/2018 revealed gastritis via the EGD in addition to nonbleeding colonic angiodysplastic lesions via colonoscopy.  Patient is Hemoccult positive in the emergency department.  Monitor h/h  Type and screen obtained we  will transfuse if hemoglobin drops below 7.  Placing patient on Protonix 40 mg IV every 12 for now  If hemoglobin and hematocrit downtrend requiring transfusion or patient develops gross bleeding will obtain gastroenterology consultation.    Acute metabolic encephalopathy  Thought to be secondary to uremia considering markedly elevated BUN with concurrent complicated UTI  Hydrating with intravenous fluids to manage acute kidney injury while concurrently treating underlying urinary tract infection  Seems to be improving, will need to clarify baseline    Hyperkalemia  monitor    Hypothyroidism  Continue home regimen of Synthroid    Chronic combined systolic and diastolic congestive heart failure ([name])  No evidence of cardiogenic volume overload at this time  Monitor closely as we volume resuscitate the patient.    GERD without esophagitis  On intravenous Protonix as noted above    Bradycardia  Substantial sinus bradycardia noted upon arrival, likely secondary to ongoing beta-blocker use  Considering low normal blood pressures and substantial bradycardia in the 40s, will hold tonight's dose of Coreg and restart tomorrow morning at a lower dose of 6.25 mg twice daily with parameters not to administer if heart rate is under 60.  Monitoring on telemetry    Hyperlipidemia associated with type 2 diabetes mellitus ([place])  Continue home regimen statin therapy    Metabolic acidosis  Notable substantial metabolic acidosis based on chemistry  Likely secondary to degree of renal injury  bicarbonate administration for now with close follow up of BMP    Family Communication/Anticipated D/C date and plan/Code Status   DVT prophylaxis: Lovenox ordered. Code Status: Full Code.  Disposition Plan: Status is: Inpatient  Remains inpatient appropriate because:Inpatient level of care appropriate due to severity of illness   Dispo: The patient is from: alf               Anticipated d/c is to: ALF              Anticipated d/c date is: 3 days              Patient currently is not medically stable to d/c.         Medical Consultants:    None.     Subjective:   Not hungry  Objective:    Vitals:   05/15/20 0500 05/15/20 0515 05/15/20 0600 05/15/20 1100  BP: (!) 125/42  (!) 128/45 (!) 127/47  Pulse: (!) 55 (!) 59 (!) 55 63  Resp:  17 14 15   Temp:      TempSrc:      SpO2: 100% 100% 99% 100%  Weight:      Height:        Intake/Output Summary (Last 24 hours) at 05/15/2020 1133 Last data filed at 05/14/2020 2205 Gross per 24 hour  Intake 1000 ml  Output --  Net 1000 ml   Filed Weights   05/14/20 1740  Weight: 52.2 kg    Exam:  General: Appearance:    thin female in no acute distress     Lungs:     respirations unlabored  Heart:    Normal heart rate. Normal rhythm. No murmurs, rubs, or gallops.   MS:   All extremities are intact.   Neurologic: Awake-- only oriented to person- time: 2012, place: "my home"    Data Reviewed:   I have personally reviewed following labs and imaging studies:  Labs: Labs show the following:   Basic Metabolic Panel: Recent Labs  Lab 05/14/20 1830 05/14/20 1830 05/15/20 0337 05/15/20 0956  NA 132*  --  136 136  K 5.5*   < > 4.9 5.1  CL 112*  --  115* 114*  CO2 11*  --  10* 11*  GLUCOSE 126*  --  105* 153*  BUN 112*  --  96* 96*  CREATININE 4.08*  --  3.24* 3.27*  CALCIUM 8.2*  --  7.3* 7.8*  MG  --   --  1.8  --    < > = values in this interval not displayed.   GFR Estimated Creatinine Clearance: 9.5 mL/min (A) (by C-G formula based on SCr of 3.27 mg/dL (H)). Liver Function Tests: Recent Labs  Lab 05/14/20 1830 05/15/20 0337  AST 14* 17  ALT 8 7  ALKPHOS 32* 32*  BILITOT 0.7 0.7  PROT 5.7* 5.1*  ALBUMIN 3.6 3.1*   Recent Labs  Lab 05/14/20 2322  LIPASE 29   No results for input(s): AMMONIA in the last 168 hours. Coagulation profile Recent Labs  Lab 05/14/20 2322   INR 1.4*    CBC: Recent Labs  Lab 05/14/20 1830 05/14/20 2322 05/15/20 0337  WBC 5.7 5.8 6.5  NEUTROABS 4.4  --   --   HGB 7.8* 7.4* 7.7*  HCT 25.8* 25.6* 26.4*  MCV 104.9* 106.2* 105.6*  PLT 176 149* 163   Cardiac Enzymes: No results for input(s): CKTOTAL, CKMB, CKMBINDEX, TROPONINI in the last 168 hours. BNP (last 3 results) No results for input(s): PROBNP in the last 8760 hours. CBG: Recent Labs  Lab 05/14/20 1918  GLUCAP 117*   D-Dimer: No results for input(s): DDIMER in the last 72 hours. Hgb  A1c: Recent Labs    05/14/20 2322  HGBA1C 5.9*   Lipid Profile: No results for input(s): CHOL, HDL, LDLCALC, TRIG, CHOLHDL, LDLDIRECT in the last 72 hours. Thyroid function studies: Recent Labs    05/14/20 2322  TSH 0.575   Anemia work up: Recent Labs    05/14/20 2322  VITAMINB12 1,487*  FOLATE 20.5  TIBC 213*  IRON 52   Sepsis Labs: Recent Labs  Lab 05/14/20 1830 05/14/20 2322 05/15/20 0337  WBC 5.7 5.8 6.5  LATICACIDVEN  --  0.5  --     Microbiology Recent Results (from the past 240 hour(s))  SARS Coronavirus 2 by RT PCR (hospital order, performed in [facility] hospital lab) Nasopharyngeal Nasopharyngeal Swab     Status: None   Collection Time: 05/14/20  6:29 PM   Specimen: Nasopharyngeal Swab  Result Value Ref Range Status   SARS Coronavirus 2 NEGATIVE NEGATIVE Final    Comment: (NOTE) SARS-CoV-2 target nucleic acids are NOT DETECTED.  The SARS-CoV-2 RNA is generally detectable in upper and lower respiratory specimens during the acute phase of infection. The lowest concentration of SARS-CoV-2 viral copies this assay can detect is 250 copies / mL. A negative result does not preclude SARS-CoV-2 infection and should not be used as the sole basis for treatment or other patient management decisions.  A negative result may occur with improper specimen collection / handling, submission of specimen other than nasopharyngeal swab, presence of viral  mutation(s) within the areas targeted by this assay, and inadequate number of viral copies (<250 copies / mL). A negative result must be combined with clinical observations, patient history, and epidemiological information.  Fact Sheet for Patients:   StrictlyIdeas.no  Fact Sheet for Healthcare Providers: BankingDealers.co.za  This test is not yet approved or  cleared by the [place] FDA and has been authorized for detection and/or diagnosis of SARS-CoV-2 by FDA under an Emergency Use Authorization (EUA).  This EUA will remain in effect (meaning this test can be used) for the duration of the COVID-19 declaration under Section 564(b)(1) of the Act, 21 U.S.C. section 360bbb-3(b)(1), unless the authorization is terminated or revoked sooner.  Performed at [facility], [place] [address], [place], [place] [address]     Procedures and diagnostic studies:  CT Head Wo Contrast  Result Date: 05/14/2020 CLINICAL DATA:  Mental status change EXAM: CT HEAD WITHOUT CONTRAST TECHNIQUE: Contiguous axial images were obtained from the base of the skull through the vertex without intravenous contrast. COMPARISON:  None. FINDINGS: Brain: No evidence of acute infarction, hemorrhage, hydrocephalus, extra-axial collection or mass lesion/mass effect.Mild atrophic changes and chronic white matter ischemic changes are noted. Vascular: No acute abnormality noted. Skull: Normal. Negative for fracture or focal lesion. Sinuses/Orbits: No acute finding. Other: None. Electronically Signed   By: Inez Catalina M.D.   On: 05/14/2020 20:36   US RENAL  Result Date: 05/14/2020 CLINICAL DATA:  Initial evaluation for acute renal failure. EXAM: RENAL / URINARY TRACT ULTRASOUND COMPLETE COMPARISON:  Prior CT from 10/07/2015. FINDINGS: Right Kidney: Renal measurements: 9.8 x 5.2 x 4.0 cm = volume: 105.4 mL. Mildly increased echogenicity within the renal parenchyma. No  nephrolithiasis or hydronephrosis. 3.6 x 3.1 x 3.2 cm simple cyst present at the lower pole. Left Kidney: Renal measurements: 9.6 x 4.4 x 3.9 cm = volume: 85.5 mL. Mildly increased echogenicity within the renal parenchyma. No nephrolithiasis or hydronephrosis. No focal renal mass. Bladder: Appears normal for degree of bladder distention. Other: Incidental note  made of a shadowing stone within the gallbladder lumen. IMPRESSION: 1. Mildly increased echogenicity within the renal parenchyma, suggesting medical renal disease. 2. No hydronephrosis. 3. 3.6 cm simple cyst at the lower pole of the right kidney. 4. Cholelithiasis. Electronically Signed   By: Jeannine Boga M.D.   On: 05/14/2020 23:07   DG Chest Portable 1 View  Result Date: 05/14/2020 CLINICAL DATA:  Weakness EXAM: PORTABLE CHEST 1 VIEW COMPARISON:  09/21/2018 FINDINGS: Cardiomegaly. Aortic atherosclerosis. Lungs clear. No effusions or edema. No acute bony abnormality. Degenerative changes in the shoulders. IMPRESSION: Cardiomegaly.  No active disease. Electronically Signed   By: Rolm Baptise M.D.   On: 05/14/2020 18:05    Medications:   . atorvastatin  40 mg Oral Daily  . carvedilol  6.25 mg Oral BID WC  . citalopram  10 mg Oral Daily  . donepezil  5 mg Oral QHS  . enoxaparin (LOVENOX) injection  30 mg Subcutaneous Q24H  . insulin aspart  0-15 Units Subcutaneous TID AC & HS  . levothyroxine  125 mcg Oral Daily  . pantoprazole (PROTONIX) IV  40 mg Intravenous Q12H  . sacubitril-valsartan  1 tablet Oral BID   Continuous Infusions: . sodium chloride 100 mL/hr at 05/15/20 1006  . cefTRIAXone (ROCEPHIN)  IV       LOS: 1 day   Geradine Girt  [facility]   How to contact the [facility] Attending or Consulting provider [place] or covering provider during after hours [place], for this patient?  1. Check the care team in [facility] and look for a) attending/consulting [facility] provider listed and b) the [facility] team listed 2. Log into  [email or web address] and use [place]'s universal password to access. If you do not have the password, please contact the hospital operator. 3. Locate the [facility] provider you are looking for under [facility] and page to a number that you can be directly reached. 4. If you still have difficulty reaching the provider, please page the [facility] (Director on Call) for the Hospitalists listed on amion for assistance.  05/15/2020, 11:33 AM

## 2020-05-15 NOTE — ED Notes (Signed)
Dinner ordered 

## 2020-05-16 LAB — BASIC METABOLIC PANEL
Anion gap: 9 (ref 5–15)
BUN: 84 mg/dL — ABNORMAL HIGH (ref 8–23)
CO2: 14 mmol/L — ABNORMAL LOW (ref 22–32)
Calcium: 7.5 mg/dL — ABNORMAL LOW (ref 8.9–10.3)
Chloride: 116 mmol/L — ABNORMAL HIGH (ref 98–111)
Creatinine, Ser: 2.77 mg/dL — ABNORMAL HIGH (ref 0.44–1.00)
GFR calc Af Amer: 17 mL/min — ABNORMAL LOW (ref >60)
GFR calc non Af Amer: 15 mL/min — ABNORMAL LOW (ref >60)
Glucose, Bld: 132 mg/dL — ABNORMAL HIGH (ref 70–99)
Potassium: 4.2 mmol/L (ref 3.5–5.1)
Sodium: 139 mmol/L (ref 135–145)

## 2020-05-16 LAB — CBC
HCT: 24.1 % — ABNORMAL LOW (ref 36.0–46.0)
Hemoglobin: 7.4 g/dL — ABNORMAL LOW (ref 12.0–15.0)
MCH: 31.1 pg (ref 26.0–34.0)
MCHC: 30.7 g/dL (ref 30.0–36.0)
MCV: 101.3 fL — ABNORMAL HIGH (ref 80.0–100.0)
Platelets: 168 10*3/uL (ref 150–400)
RBC: 2.38 MIL/uL — ABNORMAL LOW (ref 3.87–5.11)
RDW: 14.1 % (ref 11.5–15.5)
WBC: 5.4 10*3/uL (ref 4.0–10.5)
nRBC: 0 % (ref 0.0–0.2)

## 2020-05-16 LAB — GLUCOSE, CAPILLARY
Glucose-Capillary: 85 mg/dL (ref 70–99)
Glucose-Capillary: 150 mg/dL — ABNORMAL HIGH (ref 70–99)
Glucose-Capillary: 101 mg/dL — ABNORMAL HIGH (ref 70–99)
Glucose-Capillary: 157 mg/dL — ABNORMAL HIGH (ref 70–99)

## 2020-05-16 LAB — URINE CULTURE: Culture: >=100000 — AB

## 2020-05-16 MED ORDER — RISAQUAD PO CAPS
1.0000 | ORAL_CAPSULE | Freq: Every day | ORAL | Status: DC
  Administered 2020-05-16 – 2020-05-25 (×10): 1 via ORAL
  Filled 2020-05-16 (×10): qty 1

## 2020-05-16 NOTE — Evaluation (Signed)
Physical Therapy Evaluation Patient Details Name: Connie [name] MRN: [id number] DOB: May 27, 1934 Today's Date: 05/16/2020   History of Present Illness  The pt is an 86 yo female presenting from [facility] (ALF) with AMS. Upon work-up, pt found to have AKI, and UA is suggestive of UTI. PMH includes: CHF, hypothyroidism, DM II, GI bleed with chronic anemia, HLD, HTN.    Clinical Impression  Pt in bed upon arrival of PT, agreeable to evaluation at this time. Prior to admission the pt reports she was independent with short mobility in the home using a rollator, but that staff would check on her throughout the day and assist with mobility or ADLs as needed. The pt now presents with limitations in functional mobility, power, strength, and activity tolerance due to above dx, and will continue to benefit from skilled PT to address these deficits. The pt's mobility and safety are further limited by deficits in the pt's STM that limit her ability to recognize her environment, remember how to call staff for assistance, and problem solve for safety issues. The pt was able to demo good bed mobility and OOB transfers with minimal assist, and will benefit from continued skilled PT to initiate strengthening program, stability, and increase endurance for mobility.     Follow Up Recommendations Supervision/Assistance - 24 hour;Home health PT (return to [facility])    Equipment Recommendations  None recommended by PT    Recommendations for Other Services       Precautions / Restrictions Precautions Precautions: Fall Restrictions Weight Bearing Restrictions: No      Mobility  Bed Mobility Overal bed mobility: Needs Assistance Bed Mobility: Supine to Sit     Supine to sit: Supervision     General bed mobility comments: supervision with VC for sequencing, but no physical assist given. no assist needed to steady in sitting either  Transfers Overall transfer level: Needs  assistance Equipment used: Rolling walker (2 wheeled) Transfers: Sit to/from [facility] Sit to Stand: Min assist Stand pivot transfers: Min guard       General transfer comment: minA to rise from bed with VC for hand position, but minG once turning with RW. cues for hand positioning on BSC as well. pt able to readjust in recliner without any assist. sit-stand progressed to supervision aftter repeated x4 through session  Ambulation/Gait Ambulation/Gait assistance: Min guard Gait Distance (Feet): 10 Feet Assistive device: Rolling walker (2 wheeled) Gait Pattern/deviations: Step-through pattern;Decreased stride length;Shuffle;Narrow base of support;Trunk flexed Gait velocity: decreased Gait velocity interpretation: <1.31 ft/sec, indicative of household ambulator General Gait Details: short steps with minimal clearance, significant trunk flexion and reliance on RW, but no LOB or assist needed to maintain stability     Balance Overall balance assessment: Needs assistance Sitting-balance support: No upper extremity supported;Feet supported Sitting balance-Leahy Scale: Fair     Standing balance support: Bilateral upper extremity supported;During functional activity Standing balance-Leahy Scale: Poor Standing balance comment: reliant on BUE support                             Pertinent Vitals/Pain Pain Assessment: No/denies pain    Home Living Family/patient expects to be discharged to:: Assisted living [facility])               Home Equipment: Gilford Rile - 4 wheels      Prior Function Level of Independence: Needs assistance   Gait / Transfers Assistance Needed: Pt reports using rollator  independently in the home, supervision when outside of her home. reports staff checked on her more than once a day  ADL's / Homemaking Assistance Needed: assist and supervision for bathing, pt unsure of how she bathes        Hand Dominance   Dominant Hand:  Right    Extremity/Trunk Assessment   Upper Extremity Assessment Upper Extremity Assessment: Generalized weakness    Lower Extremity Assessment Lower Extremity Assessment: Generalized weakness    Cervical / Trunk Assessment Cervical / Trunk Assessment: Kyphotic  Communication   Communication: No difficulties  Cognition Arousal/Alertness: Awake/alert (initially lethargic but awoke with movement)   Overall Cognitive Status: Impaired/Different from baseline Area of Impairment: Memory;Orientation                 Orientation Level: Disoriented to;Place   Memory: Decreased recall of precautions;Decreased short-term memory         General Comments: Pt generally functional conversationally, but noted STM deficits through session as the pt frequently repeats herself and needs frequent re-orientation to the fact we are at the hospital and not [place].      General Comments General comments (skin integrity, edema, etc.): pt loves sunshine and is eager to mobilize to window when given the chance. VSS through session.     Assessment/Plan    PT Assessment Patient needs continued PT services  PT Problem List Decreased strength;Decreased mobility;Decreased range of motion;Decreased safety awareness;Decreased activity tolerance;Decreased balance;Decreased cognition       PT Treatment Interventions DME instruction;Therapeutic exercise;Gait training;Functional mobility training;Therapeutic activities;Patient/family education;Balance training;Cognitive remediation    PT Goals (Current goals can be found in the Care Plan section)  Acute Rehab PT Goals Patient Stated Goal: get into the sunshine PT Goal Formulation: With patient Time For Goal Achievement: 05/30/20 Potential to Achieve Goals: Good    Frequency Min 2X/week    AM-PAC PT "6 Clicks" Mobility  Outcome Measure Help needed turning from your back to your side while in a flat bed without using bedrails?: None Help  needed moving from lying on your back to sitting on the side of a flat bed without using bedrails?: A Little Help needed moving to and from a bed to a chair (including a wheelchair)?: A Little Help needed standing up from a chair using your arms (e.g., wheelchair or bedside chair)?: A Little Help needed to walk in hospital room?: A Little Help needed climbing 3-5 steps with a railing? : A Lot 6 Click Score: 18    End of Session Equipment Utilized During Treatment: Gait belt Activity Tolerance: Patient tolerated treatment well Patient left: in chair;with chair alarm set;with call bell/phone within reach Nurse Communication: Mobility status (need for purewick) PT Visit Diagnosis: Muscle weakness (generalized) (M62.81);Difficulty in walking, not elsewhere classified (R26.2)    Time: 3212-2482 PT Time Calculation (min) (ACUTE ONLY): 34 min   Charges:   PT Evaluation $PT Eval Moderate Complexity: 1 Mod PT Treatments $Gait Training: 8-22 mins        Karma Ganja, PT, DPT   Acute Rehabilitation Department Pager #: [phone]  Otho Bellows 05/16/2020, 11:36 AM

## 2020-05-16 NOTE — Plan of Care (Signed)
  Problem: Activity: Goal: Risk for activity intolerance will decrease Outcome: Progressing   

## 2020-05-16 NOTE — Progress Notes (Signed)
Hypoglycemic Event  CBG: 64 @ 22:21  Treatment: given 2 cups apple juice and 1 cup vanilla pudding  Symptoms: asymptomatic  Follow-up CBG: Time: 22:58 CBG Result:97  Possible Reasons for Event: Unknown  Comments/MD notified: no   Connie [name]

## 2020-05-16 NOTE — Progress Notes (Signed)
Progress Note    Connie [name]  WUJ:[id number] DOB: [birth year]  DOA: 05/14/2020 PCP: [name], MD    Brief Narrative:    Medical records reviewed and are as summarized below:  Connie [name] is an 86 y.o. female with past medical history of systolic and diastolic congestive heart failure (EF 55-60% 2018), hypothyroidism, diabetes mellitus type 2,  GI bleeding (2016 and 07/2018) with chronic iron deficiency anemia, hyperlipidemia, hypertension who presents to [facility] emergency department due to change in mental status.  Renal function slowly improving.  Assessment/Plan:   Principal Problem:   Acute kidney injury ([place]) Active Problems:   Hypothyroidism   Hyperlipidemia associated with type 2 diabetes mellitus ([place])   Controlled type 2 diabetes mellitus without complication, without long-term current use of insulin ([name])   Metabolic acidosis   Chronic combined systolic and diastolic congestive heart failure ([name])   GERD without esophagitis   Anemia   Complicated UTI (urinary tract infection)   Bradycardia   Acute metabolic encephalopathy   Hyperkalemia   Acute kidney injury due to UTI  substantial acute kidney injury with creatinine of 4, increased from baseline of 1.38   IVF  Strict input and output monitoring   renal ultrasound: medical renal disease, no blockage  Treating urinary tract infection with intravenous ceftriaxone- culture pending (gram neg rods)  Unfort. Her entresto was resumed in the ER so CR was slow to recover.  IT was stopped after 1 dose on 8/26 and with IVF her Cr is slowly improving    Anemia  Patient is a longstanding known history of chronic iron deficiency anemia and has received intravenous iron infusions in the past  Of note, patient has a history of multiple gastrointestinal bleeds in the past, notably and 2016 and again in 2019.  Last endoscopic work-up in 07/2018 revealed gastritis via the EGD in  addition to nonbleeding colonic angiodysplastic lesions via colonoscopy.  Patient is Hemoccult positive in the emergency department.  Monitor h/h  Type and screen obtained we will transfuse if hemoglobin drops below 7.  Placing patient on Protonix 40 mg IV every 12 for now  If hemoglobin and hematocrit downtrend requiring transfusion or patient develops gross bleeding will obtain gastroenterology consultation.    Acute metabolic encephalopathy  Thought to be secondary to uremia considering markedly elevated BUN with concurrent complicated UTI  Hydrating with intravenous fluids to manage acute kidney injury while concurrently treating underlying urinary tract infection  Seems to be improving    Hyperkalemia  monitor    Hypothyroidism  Continue home regimen of Synthroid    Chronic combined systolic and diastolic congestive heart failure ([name])  No evidence of cardiogenic volume overload at this time  Monitor closely as we volume resuscitate the patient.    GERD without esophagitis  On intravenous Protonix as noted above    Bradycardia  Substantial sinus bradycardia noted upon arrival, likely secondary to ongoing beta-blocker use  Considering low normal blood pressures and substantial bradycardia in the 40s, will hold tonight's dose of Coreg and restart tomorrow morning at a lower dose of 6.25 mg twice daily with parameters not to administer if heart rate is under 60.  Monitoring on telemetry    Hyperlipidemia associated with type 2 diabetes mellitus ([place])  Continue home regimen statin therapy    Metabolic acidosis  Notable substantial metabolic acidosis based on chemistry  Likely secondary to degree of renal injury  bicarbonate administration for now with close follow  up of BMP    Family Communication/Anticipated D/C date and plan/Code Status   DVT prophylaxis: Lovenox ordered. Code Status: Full Code.  Disposition Plan: Status is:  Inpatient  Remains inpatient appropriate because:Inpatient level of care appropriate due to severity of illness   Dispo: The patient is from: alf              Anticipated d/c is to: ALF              Anticipated d/c date is: 3 days              Patient currently is not medically stable to d/c.         Medical Consultants:    None.     Subjective:   Up with therapy No SOB  Objective:    Vitals:   05/15/20 1900 05/15/20 2300 05/16/20 0409 05/16/20 0702  BP: (!) 124/44 (!) 121/54 (!) 103/40 (!) 139/46  Pulse: 64 63 (!) 59 64  Resp: 18 16 16 18   Temp: 98 F (36.7 C) 97.9 F (36.6 C) 98.7 F (37.1 C) 98.8 F (37.1 C)  TempSrc:  Oral Oral Oral  SpO2: 99% 100% 97% 100%  Weight: 56.6 kg     Height:        Intake/Output Summary (Last 24 hours) at 05/16/2020 1116 Last data filed at 05/16/2020 0900 Gross per 24 hour  Intake 2287.09 ml  Output 660 ml  Net 1627.09 ml   Filed Weights   05/14/20 1740 05/15/20 1900  Weight: 52.2 kg 56.6 kg    Exam:  General: Appearance:     Overweight female in no acute distress     Lungs:      respirations unlabored  Heart:    Normal heart rate. Normal rhythm. No murmurs, rubs, or gallops.   MS:   All extremities are intact.   Neurologic:   Awake, alert. No apparent focal neurological defect.     Data Reviewed:   I have personally reviewed following labs and imaging studies:  Labs: Labs show the following:   Basic Metabolic Panel: Recent Labs  Lab 05/14/20 1830 05/14/20 1830 05/15/20 0337 05/15/20 0337 05/15/20 0956 05/15/20 0956 05/15/20 2201 05/16/20 0314  NA 132*  --  136  --  136  --  141 139  K 5.5*   < > 4.9   < > 5.1   < > 4.7 4.2  CL 112*  --  115*  --  114*  --  118* 116*  CO2 11*  --  10*  --  11*  --  11* 14*  GLUCOSE 126*  --  105*  --  153*  --  72 132*  BUN 112*  --  96*  --  96*  --  91* 84*  CREATININE 4.08*  --  3.24*  --  3.27*  --  3.02* 2.77*  CALCIUM 8.2*  --  7.3*  --  7.8*  --  7.6*  7.5*  MG  --   --  1.8  --   --   --   --   --    < > = values in this interval not displayed.   GFR Estimated Creatinine Clearance: 11.6 mL/min (A) (by C-G formula based on SCr of 2.77 mg/dL (H)). Liver Function Tests: Recent Labs  Lab 05/14/20 1830 05/15/20 0337  AST 14* 17  ALT 8 7  ALKPHOS 32* 32*  BILITOT 0.7 0.7  PROT 5.7* 5.1*  ALBUMIN 3.6  3.1*   Recent Labs  Lab 05/14/20 2322  LIPASE 29   No results for input(s): AMMONIA in the last 168 hours. Coagulation profile Recent Labs  Lab 05/14/20 2322  INR 1.4*    CBC: Recent Labs  Lab 05/14/20 1830 05/14/20 2322 05/15/20 0337 05/15/20 1159 05/16/20 0314  WBC 5.7 5.8 6.5 7.9 5.4  NEUTROABS 4.4  --   --   --   --   HGB 7.8* 7.4* 7.7* 8.1* 7.4*  HCT 25.8* 25.6* 26.4* 27.3* 24.1*  MCV 104.9* 106.2* 105.6* 103.8* 101.3*  PLT 176 149* 163 173 168   Cardiac Enzymes: No results for input(s): CKTOTAL, CKMB, CKMBINDEX, TROPONINI in the last 168 hours. BNP (last 3 results) No results for input(s): PROBNP in the last 8760 hours. CBG: Recent Labs  Lab 05/15/20 1815 05/15/20 2221 05/15/20 2257 05/16/20 0644 05/16/20 1104  GLUCAP 90 64* 97 85 150*   D-Dimer: No results for input(s): DDIMER in the last 72 hours. Hgb A1c: Recent Labs    05/14/20 2322  HGBA1C 5.9*   Lipid Profile: No results for input(s): CHOL, HDL, LDLCALC, TRIG, CHOLHDL, LDLDIRECT in the last 72 hours. Thyroid function studies: Recent Labs    05/14/20 2322  TSH 0.575   Anemia work up: Recent Labs    05/14/20 2322  VITAMINB12 1,487*  FOLATE 20.5  TIBC 213*  IRON 52   Sepsis Labs: Recent Labs  Lab 05/14/20 2322 05/15/20 0337 05/15/20 1159 05/16/20 0314  WBC 5.8 6.5 7.9 5.4  LATICACIDVEN 0.5  --   --   --     Microbiology Recent Results (from the past 240 hour(s))  SARS Coronavirus 2 by RT PCR (hospital order, performed in [facility] hospital lab) Nasopharyngeal Nasopharyngeal Swab     Status: None   Collection Time:  05/14/20  6:29 PM   Specimen: Nasopharyngeal Swab  Result Value Ref Range Status   SARS Coronavirus 2 NEGATIVE NEGATIVE Final    Comment: (NOTE) SARS-CoV-2 target nucleic acids are NOT DETECTED.  The SARS-CoV-2 RNA is generally detectable in upper and lower respiratory specimens during the acute phase of infection. The lowest concentration of SARS-CoV-2 viral copies this assay can detect is 250 copies / mL. A negative result does not preclude SARS-CoV-2 infection and should not be used as the sole basis for treatment or other patient management decisions.  A negative result may occur with improper specimen collection / handling, submission of specimen other than nasopharyngeal swab, presence of viral mutation(s) within the areas targeted by this assay, and inadequate number of viral copies (<250 copies / mL). A negative result must be combined with clinical observations, patient history, and epidemiological information.  Fact Sheet for Patients:   StrictlyIdeas.no  Fact Sheet for Healthcare Providers: BankingDealers.co.za  This test is not yet approved or  cleared by the [place] FDA and has been authorized for detection and/or diagnosis of SARS-CoV-2 by FDA under an Emergency Use Authorization (EUA).  This EUA will remain in effect (meaning this test can be used) for the duration of the COVID-19 declaration under Section 564(b)(1) of the Act, 21 U.S.C. section 360bbb-3(b)(1), unless the authorization is terminated or revoked sooner.  Performed at [facility], [place] [address] [place], [place] [address]   Urine culture     Status: Abnormal (Preliminary result)   Collection Time: 05/14/20  6:56 PM   Specimen: Urine, Random  Result Value Ref Range Status   Specimen Description URINE, RANDOM  Final  Special Requests NONE  Final   Culture (A)  Final    >=100,000 COLONIES/mL GRAM NEGATIVE RODS IDENTIFICATION AND  SUSCEPTIBILITIES TO FOLLOW Performed at [facility], [address], [place], [place] [address]    Report Status PENDING  Incomplete  Culture, blood (routine x 2)     Status: None (Preliminary result)   Collection Time: 05/14/20 11:22 PM   Specimen: BLOOD  Result Value Ref Range Status   Specimen Description BLOOD RIGHT ARM  Final   Special Requests   Final    BOTTLES DRAWN AEROBIC AND ANAEROBIC Blood Culture adequate volume   Culture   Final    NO GROWTH 1 DAY Performed at [facility], [place] [address] [place], [place] [address]    Report Status PENDING  Incomplete  Culture, blood (routine x 2)     Status: None (Preliminary result)   Collection Time: 05/14/20 11:22 PM   Specimen: BLOOD  Result Value Ref Range Status   Specimen Description BLOOD LEFT ARM  Final   Special Requests   Final    BOTTLES DRAWN AEROBIC AND ANAEROBIC Blood Culture adequate volume   Culture   Final    NO GROWTH 1 DAY Performed at [facility], [place] [address], [place], [place] [address]    Report Status PENDING  Incomplete  MRSA PCR Screening     Status: None   Collection Time: 05/15/20  8:24 PM   Specimen: Nasopharyngeal  Result Value Ref Range Status   MRSA by PCR NEGATIVE NEGATIVE Final    Comment:        The GeneXpert MRSA Assay (FDA approved for NASAL specimens only), is one component of a comprehensive MRSA colonization surveillance program. It is not intended to diagnose MRSA infection nor to guide or monitor treatment for MRSA infections. Performed at [facility], [place] [address] [place], [place] [address]     Procedures and diagnostic studies:  CT Head Wo Contrast  Result Date: 05/14/2020 CLINICAL DATA:  Mental status change EXAM: CT HEAD WITHOUT CONTRAST TECHNIQUE: Contiguous axial images were obtained from the base of the skull through the vertex without intravenous contrast. COMPARISON:  None. FINDINGS: Brain: No evidence of acute infarction, hemorrhage,  hydrocephalus, extra-axial collection or mass lesion/mass effect.Mild atrophic changes and chronic white matter ischemic changes are noted. Vascular: No acute abnormality noted. Skull: Normal. Negative for fracture or focal lesion. Sinuses/Orbits: No acute finding. Other: None. Electronically Signed   By: Inez Catalina M.D.   On: 05/14/2020 20:36   US RENAL  Result Date: 05/14/2020 CLINICAL DATA:  Initial evaluation for acute renal failure. EXAM: RENAL / URINARY TRACT ULTRASOUND COMPLETE COMPARISON:  Prior CT from 10/07/2015. FINDINGS: Right Kidney: Renal measurements: 9.8 x 5.2 x 4.0 cm = volume: 105.4 mL. Mildly increased echogenicity within the renal parenchyma. No nephrolithiasis or hydronephrosis. 3.6 x 3.1 x 3.2 cm simple cyst present at the lower pole. Left Kidney: Renal measurements: 9.6 x 4.4 x 3.9 cm = volume: 85.5 mL. Mildly increased echogenicity within the renal parenchyma. No nephrolithiasis or hydronephrosis. No focal renal mass. Bladder: Appears normal for degree of bladder distention. Other: Incidental note made of a shadowing stone within the gallbladder lumen. IMPRESSION: 1. Mildly increased echogenicity within the renal parenchyma, suggesting medical renal disease. 2. No hydronephrosis. 3. 3.6 cm simple cyst at the lower pole of the right kidney. 4. Cholelithiasis. Electronically Signed   By: Jeannine Boga M.D.   On: 05/14/2020 23:07  DG Chest Portable 1 View  Result Date: 05/14/2020 CLINICAL DATA:  Weakness EXAM: PORTABLE CHEST 1 VIEW COMPARISON:  09/21/2018 FINDINGS: Cardiomegaly. Aortic atherosclerosis. Lungs clear. No effusions or edema. No acute bony abnormality. Degenerative changes in the shoulders. IMPRESSION: Cardiomegaly.  No active disease. Electronically Signed   By: Rolm Baptise M.D.   On: 05/14/2020 18:05    Medications:   . atorvastatin  40 mg Oral Daily  . carvedilol  3.125 mg Oral BID WC  . citalopram  10 mg Oral Daily  . donepezil  5 mg Oral QHS  .  enoxaparin (LOVENOX) injection  30 mg Subcutaneous Q24H  . insulin aspart  0-15 Units Subcutaneous TID AC & HS  . levothyroxine  125 mcg Oral Daily  . pantoprazole (PROTONIX) IV  40 mg Intravenous Q12H   Continuous Infusions: . cefTRIAXone (ROCEPHIN)  IV 1 g (05/15/20 2236)  . sodium bicarbonate in 1/4 NS 1000 mL infusion 75 mL/hr at 05/16/20 0536     LOS: 2 days   Geradine Girt  [facility]   How to contact the [facility] Attending or Consulting provider [place] or covering provider during after hours [place], for this patient?  1. Check the care team in [facility] and look for a) attending/consulting TRH provider listed and b) the [facility] team listed 2. Log into [email or web address] and use [place]'s universal password to access. If you do not have the password, please contact the hospital operator. 3. Locate the [facility] provider you are looking for under [facility] and page to a number that you can be directly reached. 4. If you still have difficulty reaching the provider, please page the [facility] (Director on Call) for the Hospitalists listed on amion for assistance.  05/16/2020, 11:16 AM

## 2020-05-17 LAB — GLUCOSE, CAPILLARY
Glucose-Capillary: 77 mg/dL (ref 70–99)
Glucose-Capillary: 107 mg/dL — ABNORMAL HIGH (ref 70–99)
Glucose-Capillary: 144 mg/dL — ABNORMAL HIGH (ref 70–99)
Glucose-Capillary: 79 mg/dL (ref 70–99)

## 2020-05-17 LAB — BASIC METABOLIC PANEL
Anion gap: 9 (ref 5–15)
BUN: 70 mg/dL — ABNORMAL HIGH (ref 8–23)
CO2: 18 mmol/L — ABNORMAL LOW (ref 22–32)
Calcium: 7 mg/dL — ABNORMAL LOW (ref 8.9–10.3)
Chloride: 111 mmol/L (ref 98–111)
Creatinine, Ser: 2.27 mg/dL — ABNORMAL HIGH (ref 0.44–1.00)
GFR calc Af Amer: 22 mL/min — ABNORMAL LOW (ref >60)
GFR calc non Af Amer: 19 mL/min — ABNORMAL LOW (ref >60)
Glucose, Bld: 80 mg/dL (ref 70–99)
Potassium: 3.8 mmol/L (ref 3.5–5.1)
Sodium: 138 mmol/L (ref 135–145)

## 2020-05-17 LAB — CBC
HCT: 23.8 % — ABNORMAL LOW (ref 36.0–46.0)
Hemoglobin: 7.5 g/dL — ABNORMAL LOW (ref 12.0–15.0)
MCH: 31.3 pg (ref 26.0–34.0)
MCHC: 31.5 g/dL (ref 30.0–36.0)
MCV: 99.2 fL (ref 80.0–100.0)
Platelets: 176 10*3/uL (ref 150–400)
RBC: 2.4 MIL/uL — ABNORMAL LOW (ref 3.87–5.11)
RDW: 14.3 % (ref 11.5–15.5)
WBC: 7 10*3/uL (ref 4.0–10.5)
nRBC: 0 % (ref 0.0–0.2)

## 2020-05-17 MED ORDER — LACTATED RINGERS IV SOLN: INTRAVENOUS | Status: DC

## 2020-05-17 NOTE — TOC Initial Note (Signed)
Transition of Care [facility]) - Initial/Assessment Note    Patient Details  Name: Connie [name] MRN: [id number] Date of Birth: 05-Nov-1933  Transition of Care [facility]) CM/SW Contact:    Connie [name] Phone Number:  05/17/2020, 3:41 PM  Clinical Narrative:                 CSW spoke with patient's son Connie [name] regarding PT recommendation  Son confirmed patient is from [place], and he would like her to return there with [facility] at discharge. TOC team will continue to follow.   Expected Discharge Plan: Assisted Living Barriers to Discharge: Continued Medical Work up   Patient Goals and CMS Choice   CMS Medicare.gov Compare Post Acute Care list provided to:: Patient Represenative (must comment) Connie [name], son) Choice offered to / list presented to : Adult Children  Expected Discharge Plan and Services Expected Discharge Plan: Assisted Living In-house Referral: Clinical Social Work     Living arrangements for the past 2 months: [place]                                      Prior Living Arrangements/Services Living arrangements for the past 2 months: [place] Lives with:: Facility Resident Patient language and need for interpreter reviewed:: No Do you feel safe going back to the place where you live?: Yes      Need for Family Participation in Patient Care: Yes (Comment) Care giver support system in place?: Yes (comment)   Criminal Activity/Legal Involvement Pertinent to Current Situation/Hospitalization: No - Comment as needed  Activities of Daily Living      Permission Sought/Granted Permission sought to share information with : Facility Sport and exercise psychologist, Family Supports    Share Information with NAME: Connie [name]  Permission granted to share info w AGENCY: ALF  Permission granted to share info w Relationship: Son  Permission granted to share info w Contact Information: [phone]  Emotional  Assessment   Attitude/Demeanor/Rapport: Unable to Assess Affect (typically observed): Unable to Assess Orientation: : Oriented to Self, Oriented to Place Alcohol / Substance Use: Not Applicable Psych Involvement: No (comment)  Admission diagnosis:  Renal failure [N19] Acute cystitis without hematuria [N30.00] Uremia, acute [N19] Acute kidney injury ([place]) [N17.9] Patient Active Problem List   Diagnosis Date Noted   Acute kidney injury ([place]) 49/20/1007   Complicated UTI (urinary tract infection) 05/14/2020   Bradycardia 09/08/7587   Acute metabolic encephalopathy 32/54/9826   Hyperkalemia 05/14/2020   Edema 07/25/2019   Hypocalcemia    Anemia    GERD without esophagitis 06/14/2018   Osteoporosis 05/25/2018   History of cataract extraction 10/19/2017   Aortic atherosclerosis ([place]) 10/19/2016   Long-term use of high-risk medication 08/04/2016   Coronary artery disease involving native heart without angina pectoris 08/03/2016   NICM (nonischemic cardiomyopathy) ([place] 08/03/2016   Senile purpura ([place]) 02/02/2016   Macular degeneration (senile) of retina 12/23/2015   Chronic combined systolic and diastolic congestive heart failure ([place]) 41/58/3094   Metabolic acidosis 07/68/0881   Angiodysplasia of stomach and duodenum with hemorrhage 06/24/2015   Iron deficiency anemia due to chronic blood loss 06/24/2015   Controlled type 2 diabetes mellitus without complication, without long-term current use of insulin ([name])    Obesity (BMI 30.0-34.9) 07/25/2014   Hypertension associated with diabetes ([place]) 03/15/2012   Hypothyroidism 03/15/2012   Hyperlipidemia associated with type 2 diabetes mellitus ([place]) 03/15/2012  PCP:  [name], MD Pharmacy:   [place], [place] [address] Phone: [phone] Fax: [phone]  [place], [place] - [address] [facility]. [address] [place] [address] Phone: [phone] Fax: [phone]  [id number] [place], [address] [place] [address] Phone: [phone] Fax: [phone] Alfredo Batty, [place] [address] [place] [address] Phone: [phone] Fax: [phone]     Social Determinants of Health (SDOH) Interventions    Readmission Risk Interventions No flowsheet data found.

## 2020-05-17 NOTE — Plan of Care (Signed)
  Problem: Education: Goal: Knowledge of General Education information will improve Description Including pain rating scale, medication(s)/side effects and non-pharmacologic comfort measures Outcome: Progressing   

## 2020-05-17 NOTE — Progress Notes (Signed)
Progress Note    Connie [name]  HKV:[id number] DOB: 01/17/1934  DOA: 05/14/2020 PCP: [name], MD    Brief Narrative:    Medical records reviewed and are as summarized below:  Connie [name] is an 86 y.o. female with past medical history of systolic and diastolic congestive heart failure (EF 55-60% 2018), hypothyroidism, diabetes mellitus type 2,  GI bleeding (2016 and 07/2018) with chronic iron deficiency anemia, hyperlipidemia, hypertension who presents to [facility] emergency department due to change in mental status.  Renal function slowly improving.  Assessment/Plan:   Principal Problem:   Acute kidney injury ([place]) Active Problems:   Hypothyroidism   Hyperlipidemia associated with type 2 diabetes mellitus ([place])   Controlled type 2 diabetes mellitus without complication, without long-term current use of insulin ([name])   Metabolic acidosis   Chronic combined systolic and diastolic congestive heart failure ([name])   GERD without esophagitis   Anemia   Complicated UTI (urinary tract infection)   Bradycardia   Acute metabolic encephalopathy   Hyperkalemia   Acute kidney injury due to UTI (e coli)  substantial acute kidney injury with creatinine of 4, increased from baseline of 1.38   IVF  Strict input and output monitoring   renal ultrasound: medical renal disease, no blockage  Treating urinary tract infection with intravenous ceftriaxone- culture pending (gram neg rods)  Unfort. Her entresto was resumed in the ER so CR was initially not recovering.  was stopped after 1 dose on 8/26 and with IVF her Cr is slowly improving    Anemia  Patient is a longstanding known history of chronic iron deficiency anemia and has received intravenous iron infusions in the past  Of note, patient has a history of multiple gastrointestinal bleeds in the past, notably and 2016 and again in 2019.  Last endoscopic work-up in 07/2018 revealed gastritis via  the EGD in addition to nonbleeding colonic angiodysplastic lesions via colonoscopy.  Patient is Hemoccult positive in the emergency department.  Monitor h/h  Type and screen obtained we will transfuse if hemoglobin drops below 7.  Placing patient on Protonix 40 mg IV every 12 for now  If hemoglobin and hematocrit downtrend requiring transfusion or patient develops gross bleeding will obtain gastroenterology consultation.    Acute metabolic encephalopathy- but per son baseline is confused  Thought to be secondary to uremia considering markedly elevated BUN with concurrent complicated UTI  Hydrating with intravenous fluids to manage acute kidney injury while concurrently treating underlying urinary tract infection  Seems to be improving at at baseline    Hyperkalemia  monitor    Hypothyroidism  Continue home regimen of Synthroid    Chronic combined systolic and diastolic congestive heart failure ([name])  No evidence of cardiogenic volume overload at this time  Monitor closely as we volume resuscitate the patient.    GERD without esophagitis  On intravenous Protonix as noted above    Bradycardia  Substantial sinus bradycardia noted upon arrival, likely secondary to ongoing beta-blocker use  Considering low normal blood pressures and substantial bradycardia in the 40s, will hold tonight's dose of Coreg and restart tomorrow morning at a lower dose of 6.25 mg twice daily with parameters not to administer if heart rate is under 60.  Monitoring on telemetry    Hyperlipidemia associated with type 2 diabetes mellitus ([place])  Continue home regimen statin therapy    Metabolic acidosis  Notable substantial metabolic acidosis based on chemistry  Likely secondary to degree of  renal injury  bicarbonate administration for now with close follow up of BMP    Family Communication/Anticipated D/C date and plan/Code Status   DVT prophylaxis: Lovenox ordered. Code Status:  Full Code.  Disposition Plan: Status is: Inpatient Son updated on phone Remains inpatient appropriate because:Inpatient level of care appropriate due to severity of illness   Dispo: The patient is from: alf              Anticipated d/c is to: ALF              Anticipated d/c date is: 3 days              Patient currently is not medically stable to d/c.         Medical Consultants:    None.     Subjective:  C/o ankle soreness  Objective:    Vitals:   05/16/20 0702 05/16/20 1650 05/16/20 2114 05/17/20 0531  BP: (!) 139/46 (!) 140/39 (!) 134/44 (!) 131/40  Pulse: 64 61 61 67  Resp: 18 16 18 16   Temp: 98.8 F (37.1 C) 98 F (36.7 C) 97.7 F (36.5 C) 97.7 F (36.5 C)  TempSrc: Oral Oral Oral Oral  SpO2: 100% 100% 99% 98%  Weight:      Height:        Intake/Output Summary (Last 24 hours) at 05/17/2020 1045 Last data filed at 05/17/2020 0844 Gross per 24 hour  Intake 3022.06 ml  Output 300 ml  Net 2722.06 ml   Filed Weights   05/14/20 1740 05/15/20 1900  Weight: 52.2 kg 56.6 kg    Exam:  General: Appearance:     Overweight female in no acute distress     Lungs:     respirations unlabored  Heart:    Normal heart rate. Normal rhythm. No murmurs, rubs, or gallops.   MS:   All extremities are intact.   Neurologic:   Awake, alert, oriented x 3. No apparent focal neurological           defect.     Data Reviewed:   I have personally reviewed following labs and imaging studies:  Labs: Labs show the following:   Basic Metabolic Panel: Recent Labs  Lab 05/15/20 0337 05/15/20 0337 05/15/20 0956 05/15/20 0956 05/15/20 2201 05/15/20 2201 05/16/20 0314 05/17/20 0405  NA 136  --  136  --  141  --  139 138  K 4.9   < > 5.1   < > 4.7   < > 4.2 3.8  CL 115*  --  114*  --  118*  --  116* 111  CO2 10*  --  11*  --  11*  --  14* 18*  GLUCOSE 105*  --  153*  --  72  --  132* 80  BUN 96*  --  96*  --  91*  --  84* 70*  CREATININE 3.24*  --  3.27*  --   3.02*  --  2.77* 2.27*  CALCIUM 7.3*  --  7.8*  --  7.6*  --  7.5* 7.0*  MG 1.8  --   --   --   --   --   --   --    < > = values in this interval not displayed.   GFR Estimated Creatinine Clearance: 14.2 mL/min (A) (by C-G formula based on SCr of 2.27 mg/dL (H)). Liver Function Tests: Recent Labs  Lab 05/14/20 1830 05/15/20 0337  AST 14*  17  ALT 8 7  ALKPHOS 32* 32*  BILITOT 0.7 0.7  PROT 5.7* 5.1*  ALBUMIN 3.6 3.1*   Recent Labs  Lab 05/14/20 2322  LIPASE 29   No results for input(s): AMMONIA in the last 168 hours. Coagulation profile Recent Labs  Lab 05/14/20 2322  INR 1.4*    CBC: Recent Labs  Lab 05/14/20 1830 05/14/20 1830 05/14/20 2322 05/15/20 0337 05/15/20 1159 05/16/20 0314 05/17/20 0405  WBC 5.7   < > 5.8 6.5 7.9 5.4 7.0  NEUTROABS 4.4  --   --   --   --   --   --   HGB 7.8*   < > 7.4* 7.7* 8.1* 7.4* 7.5*  HCT 25.8*   < > 25.6* 26.4* 27.3* 24.1* 23.8*  MCV 104.9*   < > 106.2* 105.6* 103.8* 101.3* 99.2  PLT 176   < > 149* 163 173 168 176   < > = values in this interval not displayed.   Cardiac Enzymes: No results for input(s): CKTOTAL, CKMB, CKMBINDEX, TROPONINI in the last 168 hours. BNP (last 3 results) No results for input(s): PROBNP in the last 8760 hours. CBG: Recent Labs  Lab 05/16/20 0644 05/16/20 1104 05/16/20 1636 05/16/20 2111 05/17/20 0636  GLUCAP 85 150* 101* 157* 77   D-Dimer: No results for input(s): DDIMER in the last 72 hours. Hgb A1c: Recent Labs    05/14/20 2322  HGBA1C 5.9*   Lipid Profile: No results for input(s): CHOL, HDL, LDLCALC, TRIG, CHOLHDL, LDLDIRECT in the last 72 hours. Thyroid function studies: Recent Labs    05/14/20 2322  TSH 0.575   Anemia work up: Recent Labs    05/14/20 2322  VITAMINB12 1,487*  FOLATE 20.5  TIBC 213*  IRON 52   Sepsis Labs: Recent Labs  Lab 05/14/20 2322 05/14/20 2322 05/15/20 0337 05/15/20 1159 05/16/20 0314 05/17/20 0405  WBC 5.8   < > 6.5 7.9 5.4 7.0    LATICACIDVEN 0.5  --   --   --   --   --    < > = values in this interval not displayed.    Microbiology Recent Results (from the past 240 hour(s))  SARS Coronavirus 2 by RT PCR (hospital order, performed in [facility] hospital lab) Nasopharyngeal Nasopharyngeal Swab     Status: None   Collection Time: 05/14/20  6:29 PM   Specimen: Nasopharyngeal Swab  Result Value Ref Range Status   SARS Coronavirus 2 NEGATIVE NEGATIVE Final    Comment: (NOTE) SARS-CoV-2 target nucleic acids are NOT DETECTED.  The SARS-CoV-2 RNA is generally detectable in upper and lower respiratory specimens during the acute phase of infection. The lowest concentration of SARS-CoV-2 viral copies this assay can detect is 250 copies / mL. A negative result does not preclude SARS-CoV-2 infection and should not be used as the sole basis for treatment or other patient management decisions.  A negative result may occur with improper specimen collection / handling, submission of specimen other than nasopharyngeal swab, presence of viral mutation(s) within the areas targeted by this assay, and inadequate number of viral copies (<250 copies / mL). A negative result must be combined with clinical observations, patient history, and epidemiological information.  Fact Sheet for Patients:   StrictlyIdeas.no  Fact Sheet for Healthcare Providers: BankingDealers.co.za  This test is not yet approved or  cleared by the [place] FDA and has been authorized for detection and/or diagnosis of SARS-CoV-2 by FDA under an Emergency Use Authorization (EUA).  This EUA will remain in effect (meaning this test can be used) for the duration of the COVID-19 declaration under Section 564(b)(1) of the Act, 21 U.S.C. section 360bbb-3(b)(1), unless the authorization is terminated or revoked sooner.  Performed at [facility], [place] [address], [place], [place] [address]   Urine  culture     Status: Abnormal   Collection Time: 05/14/20  6:56 PM   Specimen: Urine, Random  Result Value Ref Range Status   Specimen Description URINE, RANDOM  Final   Special Requests   Final    NONE Performed at [facility], [place] [address], [place], [place] [address]    Culture >=100,000 COLONIES/mL ESCHERICHIA COLI (A)  Final   Report Status 05/16/2020 FINAL  Final   Organism ID, Bacteria ESCHERICHIA COLI (A)  Final      Susceptibility   Escherichia coli - MIC*    AMPICILLIN 8 SENSITIVE Sensitive     CEFAZOLIN <=4 SENSITIVE Sensitive     CEFTRIAXONE <=0.25 SENSITIVE Sensitive     CIPROFLOXACIN <=0.25 SENSITIVE Sensitive     GENTAMICIN <=1 SENSITIVE Sensitive     IMIPENEM <=0.25 SENSITIVE Sensitive     NITROFURANTOIN 32 SENSITIVE Sensitive     TRIMETH/SULFA <=20 SENSITIVE Sensitive     AMPICILLIN/SULBACTAM <=2 SENSITIVE Sensitive     PIP/TAZO <=4 SENSITIVE Sensitive     * >=100,000 COLONIES/mL ESCHERICHIA COLI  Culture, blood (routine x 2)     Status: None (Preliminary result)   Collection Time: 05/14/20 11:22 PM   Specimen: BLOOD  Result Value Ref Range Status   Specimen Description BLOOD RIGHT ARM  Final   Special Requests   Final    BOTTLES DRAWN AEROBIC AND ANAEROBIC Blood Culture adequate volume   Culture   Final    NO GROWTH 1 DAY Performed at [facility], [address] [place], [place] [address]    Report Status PENDING  Incomplete  Culture, blood (routine x 2)     Status: None (Preliminary result)   Collection Time: 05/14/20 11:22 PM   Specimen: BLOOD  Result Value Ref Range Status   Specimen Description BLOOD LEFT ARM  Final   Special Requests   Final    BOTTLES DRAWN AEROBIC AND ANAEROBIC Blood Culture adequate volume   Culture   Final    NO GROWTH 1 DAY Performed at [facility], [place] [address], [place], [place] [address]    Report Status PENDING  Incomplete  MRSA PCR Screening     Status: None   Collection Time: 05/15/20  8:24 PM     Specimen: Nasopharyngeal  Result Value Ref Range Status   MRSA by PCR NEGATIVE NEGATIVE Final    Comment:        The GeneXpert MRSA Assay (FDA approved for NASAL specimens only), is one component of a comprehensive MRSA colonization surveillance program. It is not intended to diagnose MRSA infection nor to guide or monitor treatment for MRSA infections. Performed at [facility], [place] [address], [place], [place] [address]     Procedures and diagnostic studies:  No results found.  Medications:   . acidophilus  1 capsule Oral Daily  . atorvastatin  40 mg Oral Daily  . carvedilol  3.125 mg Oral BID WC  . citalopram  10 mg Oral Daily  . donepezil  5 mg Oral QHS  . enoxaparin (LOVENOX) injection  30 mg Subcutaneous Q24H  . insulin aspart  0-15 Units  Subcutaneous TID AC & HS  . levothyroxine  125 mcg Oral Daily  . pantoprazole (PROTONIX) IV  40 mg Intravenous Q12H   Continuous Infusions: . cefTRIAXone (ROCEPHIN)  IV 1 g (05/16/20 2133)  . lactated ringers       LOS: 3 days   Geradine Girt  [facility]   How to contact the [facility] Attending or Consulting provider [place] or covering provider during after hours [place], for this patient?  1. Check the care team in [facility] and look for a) attending/consulting [facility] provider listed and b) the [facility] team listed 2. Log into [email or web address] and use [place]'s universal password to access. If you do not have the password, please contact the hospital operator. 3. Locate the [facility] provider you are looking for under [facility] and page to a number that you can be directly reached. 4. If you still have difficulty reaching the provider, please page the [facility] (Director on Call) for the Hospitalists listed on amion for assistance.  05/17/2020, 10:45 AM

## 2020-05-17 NOTE — Progress Notes (Signed)
RN is unable to obtain a urine and stool sample or measurements of either due to patient has bowel movement and urinates at the same time each time that she uses the bathroom.

## 2020-05-18 ENCOUNTER — Inpatient Hospital Stay (HOSPITAL_COMMUNITY): Payer: Medicare HMO

## 2020-05-18 LAB — CBC
HCT: 23.3 % — ABNORMAL LOW (ref 36.0–46.0)
Hemoglobin: 7.3 g/dL — ABNORMAL LOW (ref 12.0–15.0)
MCH: 31.3 pg (ref 26.0–34.0)
MCHC: 31.3 g/dL (ref 30.0–36.0)
MCV: 100 fL (ref 80.0–100.0)
Platelets: 161 10*3/uL (ref 150–400)
RBC: 2.33 MIL/uL — ABNORMAL LOW (ref 3.87–5.11)
RDW: 14.3 % (ref 11.5–15.5)
WBC: 8.4 10*3/uL (ref 4.0–10.5)
nRBC: 0 % (ref 0.0–0.2)

## 2020-05-18 LAB — BASIC METABOLIC PANEL
Anion gap: 11 (ref 5–15)
BUN: 54 mg/dL — ABNORMAL HIGH (ref 8–23)
CO2: 21 mmol/L — ABNORMAL LOW (ref 22–32)
Calcium: 7.2 mg/dL — ABNORMAL LOW (ref 8.9–10.3)
Chloride: 108 mmol/L (ref 98–111)
Creatinine, Ser: 1.89 mg/dL — ABNORMAL HIGH (ref 0.44–1.00)
GFR calc Af Amer: 28 mL/min — ABNORMAL LOW (ref >60)
GFR calc non Af Amer: 24 mL/min — ABNORMAL LOW (ref >60)
Glucose, Bld: 95 mg/dL (ref 70–99)
Potassium: 3.9 mmol/L (ref 3.5–5.1)
Sodium: 140 mmol/L (ref 135–145)

## 2020-05-18 LAB — GLUCOSE, CAPILLARY
Glucose-Capillary: 93 mg/dL (ref 70–99)
Glucose-Capillary: 140 mg/dL — ABNORMAL HIGH (ref 70–99)
Glucose-Capillary: 83 mg/dL (ref 70–99)
Glucose-Capillary: 143 mg/dL — ABNORMAL HIGH (ref 70–99)

## 2020-05-18 LAB — OCCULT BLOOD X 1 CARD TO LAB, STOOL: Fecal Occult Bld: POSITIVE — AB

## 2020-05-18 LAB — HEMOGLOBIN AND HEMATOCRIT, BLOOD
HCT: 26.6 % — ABNORMAL LOW (ref 36.0–46.0)
Hemoglobin: 8.3 g/dL — ABNORMAL LOW (ref 12.0–15.0)

## 2020-05-18 LAB — PREPARE RBC (CROSSMATCH)

## 2020-05-18 MED ORDER — SODIUM CHLORIDE 0.9% IV SOLUTION: Freq: Once | INTRAVENOUS | Status: AC

## 2020-05-18 MED ORDER — CEPHALEXIN 500 MG PO CAPS
500.0000 mg | ORAL_CAPSULE | Freq: Once | ORAL | Status: AC
  Administered 2020-05-18: 500 mg via ORAL
  Filled 2020-05-18: qty 1

## 2020-05-18 NOTE — Progress Notes (Signed)
Ok to change ceftriaxone to PO keflex x1 dose to finish out 5d of therapy. With her poor renal function, keflex 500mg  will provide 24 hr coverage. Ok Dr. Iverson Alamin, PharmD, BCIDP, AAHIVP, CPP Infectious Disease Pharmacist 05/18/2020 10:23 AM

## 2020-05-18 NOTE — Progress Notes (Signed)
Progress Note    Connie [name]  TFT:[id number] DOB: 1934/03/10  DOA: 05/14/2020 PCP: [name], MD    Brief Narrative:    Medical records reviewed and are as summarized below:  Connie [name] is an 86 y.o. female with past medical history of systolic and diastolic congestive heart failure (EF 55-60% 2018), hypothyroidism, diabetes mellitus type 2,  GI bleeding (2016 and 07/2018) with chronic iron deficiency anemia, hyperlipidemia, hypertension who presents to [facility] emergency department due to change in mental status.  Renal function slowly improving.  Assessment/Plan:   Principal Problem:   Acute kidney injury ([place]) Active Problems:   Hypothyroidism   Hyperlipidemia associated with type 2 diabetes mellitus ([place])   Controlled type 2 diabetes mellitus without complication, without long-term current use of insulin ([name])   Metabolic acidosis   Chronic combined systolic and diastolic congestive heart failure ([name])   GERD without esophagitis   Anemia   Complicated UTI (urinary tract infection)   Bradycardia   Acute metabolic encephalopathy   Hyperkalemia   Acute kidney injury due to UTI (e coli)  substantial acute kidney injury with creatinine of 4, increased from baseline of 1.38   IVF d/c'd as patient close to baseline  Strict input and output monitoring   renal ultrasound: medical renal disease, no blockage  Treating urinary tract infection with intravenous ceftriaxone- change to PO to finish 5 day course    Anemia  Patient is a longstanding known history of chronic iron deficiency anemia and has received intravenous iron infusions in the past  Of note, patient has a history of multiple gastrointestinal bleeds in the past, notably and 2016 and again in 2019.  Last endoscopic work-up in 07/2018 revealed gastritis via the EGD in addition to nonbleeding colonic angiodysplastic lesions via colonoscopy.  Patient is Hemoccult positive  in the emergency department.  Will transfuse 1 unit PRBC, most recent stool was brown and not black  GI doc is MAnn/Hung    Acute metabolic encephalopathy- but per son baseline is confused  Thought to be secondary to uremia considering markedly elevated BUN with concurrent complicated UTI  Hydrating with intravenous fluids to manage acute kidney injury while concurrently treating underlying urinary tract infection  Seems to be improving at at baseline    Hyperkalemia  resolved    Hypothyroidism  Continue home regimen of Synthroid    Chronic combined systolic and diastolic congestive heart failure ([name])  No evidence of cardiogenic volume overload at this time  Monitor closely as we volume resuscitate the patient.  On lasix and entresto at home (h/o non-ischemic cardiomyopathy)    GERD without esophagitis  On intravenous Protonix as noted above- change to PO in AM    Bradycardia  Substantial sinus bradycardia noted upon arrival, likely secondary to ongoing beta-blocker use  Coreg at a lower dose    Hyperlipidemia associated with type 2 diabetes mellitus ([place])  Continue home regimen statin therapy    Metabolic acidosis  Notable substantial metabolic acidosis based on chemistry  Likely secondary to degree of renal injury  resolved    Family Communication/Anticipated D/C date and plan/Code Status   DVT prophylaxis: Lovenox ordered. Code Status: Full Code.  Disposition Plan: Status is: Inpatient Son updated on phone 8/28 Remains inpatient appropriate because:Inpatient level of care appropriate due to severity of illness   Dispo: The patient is from: alf              Anticipated d/c is to:  ALF              Anticipated d/c date is: 3 days              Patient currently is not medically stable to d/c.         Medical Consultants:    None.     Subjective:    c/o swelling and pain in the left middle finger  Objective:    Vitals:    05/17/20 1730 05/17/20 2209 05/18/20 0643 05/18/20 1101  BP: (!) 123/43 (!) 134/43 (!) 140/42 (!) 137/58  Pulse: (!) 59 65 76 69  Resp: 16 18 18 19   Temp: 98.3 F (36.8 C) 99.4 F (37.4 C) 99.3 F (37.4 C) 99.2 F (37.3 C)  TempSrc: Oral Oral Oral Oral  SpO2: 99% 96% 96% 96%  Weight:      Height:        Intake/Output Summary (Last 24 hours) at 05/18/2020 1124 Last data filed at 05/18/2020 0825 Gross per 24 hour  Intake 1702.78 ml  Output 551 ml  Net 1151.78 ml   Filed Weights   05/14/20 1740 05/15/20 1900  Weight: 52.2 kg 56.6 kg    Exam:  General: Appearance:     Overweight female in no acute distress     Lungs:     respirations unlabored  Heart:    Normal heart rate. Normal rhythm. No murmurs, rubs, or gallops.   MS:   All extremities are intact.   Neurologic:   Awake, alert, oriented x 3. No apparent focal neurological           defect.     Data Reviewed:   I have personally reviewed following labs and imaging studies:  Labs: Labs show the following:   Basic Metabolic Panel: Recent Labs  Lab 05/15/20 0337 05/15/20 0337 05/15/20 0956 05/15/20 0956 05/15/20 2201 05/15/20 2201 05/16/20 0314 05/16/20 0314 05/17/20 0405 05/18/20 0136  NA 136   < > 136  --  141  --  139  --  138 140  K 4.9   < > 5.1   < > 4.7   < > 4.2   < > 3.8 3.9  CL 115*   < > 114*  --  118*  --  116*  --  111 108  CO2 10*   < > 11*  --  11*  --  14*  --  18* 21*  GLUCOSE 105*   < > 153*  --  72  --  132*  --  80 95  BUN 96*   < > 96*  --  91*  --  84*  --  70* 54*  CREATININE 3.24*   < > 3.27*  --  3.02*  --  2.77*  --  2.27* 1.89*  CALCIUM 7.3*   < > 7.8*  --  7.6*  --  7.5*  --  7.0* 7.2*  MG 1.8  --   --   --   --   --   --   --   --   --    < > = values in this interval not displayed.   GFR Estimated Creatinine Clearance: 17 mL/min (A) (by C-G formula based on SCr of 1.89 mg/dL (H)). Liver Function Tests: Recent Labs  Lab 05/14/20 1830 05/15/20 0337  AST 14* 17  ALT 8 7   ALKPHOS 32* 32*  BILITOT 0.7 0.7  PROT 5.7* 5.1*  ALBUMIN 3.6 3.1*  Recent Labs  Lab 05/14/20 2322  LIPASE 29   No results for input(s): AMMONIA in the last 168 hours. Coagulation profile Recent Labs  Lab 05/14/20 2322  INR 1.4*    CBC: Recent Labs  Lab 05/14/20 1830 05/14/20 2322 05/15/20 0337 05/15/20 1159 05/16/20 0314 05/17/20 0405 05/18/20 0136  WBC 5.7   < > 6.5 7.9 5.4 7.0 8.4  NEUTROABS 4.4  --   --   --   --   --   --   HGB 7.8*   < > 7.7* 8.1* 7.4* 7.5* 7.3*  HCT 25.8*   < > 26.4* 27.3* 24.1* 23.8* 23.3*  MCV 104.9*   < > 105.6* 103.8* 101.3* 99.2 100.0  PLT 176   < > 163 173 168 176 161   < > = values in this interval not displayed.   Cardiac Enzymes: No results for input(s): CKTOTAL, CKMB, CKMBINDEX, TROPONINI in the last 168 hours. BNP (last 3 results) No results for input(s): PROBNP in the last 8760 hours. CBG: Recent Labs  Lab 05/17/20 1205 05/17/20 1644 05/17/20 2202 05/18/20 0639 05/18/20 1102  GLUCAP 107* 144* 79 93 140*   D-Dimer: No results for input(s): DDIMER in the last 72 hours. Hgb A1c: No results for input(s): HGBA1C in the last 72 hours. Lipid Profile: No results for input(s): CHOL, HDL, LDLCALC, TRIG, CHOLHDL, LDLDIRECT in the last 72 hours. Thyroid function studies: No results for input(s): TSH, T4TOTAL, T3FREE, THYROIDAB in the last 72 hours.  Invalid input(s): FREET3 Anemia work up: No results for input(s): VITAMINB12, FOLATE, FERRITIN, TIBC, IRON, RETICCTPCT in the last 72 hours. Sepsis Labs: Recent Labs  Lab 05/14/20 2322 05/15/20 0337 05/15/20 1159 05/16/20 0314 05/17/20 0405 05/18/20 0136  WBC 5.8   < > 7.9 5.4 7.0 8.4  LATICACIDVEN 0.5  --   --   --   --   --    < > = values in this interval not displayed.    Microbiology Recent Results (from the past 240 hour(s))  SARS Coronavirus 2 by RT PCR (hospital order, performed in [facility] hospital lab) Nasopharyngeal Nasopharyngeal Swab     Status: None     Collection Time: 05/14/20  6:29 PM   Specimen: Nasopharyngeal Swab  Result Value Ref Range Status   SARS Coronavirus 2 NEGATIVE NEGATIVE Final    Comment: (NOTE) SARS-CoV-2 target nucleic acids are NOT DETECTED.  The SARS-CoV-2 RNA is generally detectable in upper and lower respiratory specimens during the acute phase of infection. The lowest concentration of SARS-CoV-2 viral copies this assay can detect is 250 copies / mL. A negative result does not preclude SARS-CoV-2 infection and should not be used as the sole basis for treatment or other patient management decisions.  A negative result may occur with improper specimen collection / handling, submission of specimen other than nasopharyngeal swab, presence of viral mutation(s) within the areas targeted by this assay, and inadequate number of viral copies (<250 copies / mL). A negative result must be combined with clinical observations, patient history, and epidemiological information.  Fact Sheet for Patients:   StrictlyIdeas.no  Fact Sheet for Healthcare Providers: BankingDealers.co.za  This test is not yet approved or  cleared by the [place] FDA and has been authorized for detection and/or diagnosis of SARS-CoV-2 by FDA under an Emergency Use Authorization (EUA).  This EUA will remain in effect (meaning this test can be used) for the duration of the COVID-19 declaration under Section 564(b)(1) of the Act,  21 U.S.C. section 360bbb-3(b)(1), unless the authorization is terminated or revoked sooner.  Performed at [facility], [place] [address], [place], [place] [address]   Urine culture     Status: Abnormal   Collection Time: 05/14/20  6:56 PM   Specimen: Urine, Random  Result Value Ref Range Status   Specimen Description URINE, RANDOM  Final   Special Requests   Final    NONE Performed at [facility], [place] [address] [place], [place] [address]    Culture  >=100,000 COLONIES/mL ESCHERICHIA COLI (A)  Final   Report Status 05/16/2020 FINAL  Final   Organism ID, Bacteria ESCHERICHIA COLI (A)  Final      Susceptibility   Escherichia coli - MIC*    AMPICILLIN 8 SENSITIVE Sensitive     CEFAZOLIN <=4 SENSITIVE Sensitive     CEFTRIAXONE <=0.25 SENSITIVE Sensitive     CIPROFLOXACIN <=0.25 SENSITIVE Sensitive     GENTAMICIN <=1 SENSITIVE Sensitive     IMIPENEM <=0.25 SENSITIVE Sensitive     NITROFURANTOIN 32 SENSITIVE Sensitive     TRIMETH/SULFA <=20 SENSITIVE Sensitive     AMPICILLIN/SULBACTAM <=2 SENSITIVE Sensitive     PIP/TAZO <=4 SENSITIVE Sensitive     * >=100,000 COLONIES/mL ESCHERICHIA COLI  Culture, blood (routine x 2)     Status: None (Preliminary result)   Collection Time: 05/14/20 11:22 PM   Specimen: BLOOD  Result Value Ref Range Status   Specimen Description BLOOD RIGHT ARM  Final   Special Requests   Final    BOTTLES DRAWN AEROBIC AND ANAEROBIC Blood Culture adequate volume   Culture   Final    NO GROWTH 2 DAYS Performed at [facility], [address], [place], [place] [address]    Report Status PENDING  Incomplete  Culture, blood (routine x 2)     Status: None (Preliminary result)   Collection Time: 05/14/20 11:22 PM   Specimen: BLOOD  Result Value Ref Range Status   Specimen Description BLOOD LEFT ARM  Final   Special Requests   Final    BOTTLES DRAWN AEROBIC AND ANAEROBIC Blood Culture adequate volume   Culture   Final    NO GROWTH 2 DAYS Performed at [facility], [place] [address], [place], [place] [address]    Report Status PENDING  Incomplete  MRSA PCR Screening     Status: None   Collection Time: 05/15/20  8:24 PM   Specimen: Nasopharyngeal  Result Value Ref Range Status   MRSA by PCR NEGATIVE NEGATIVE Final    Comment:        The GeneXpert MRSA Assay (FDA approved for NASAL specimens only), is one component of a comprehensive MRSA colonization surveillance program. It is not intended to  diagnose MRSA infection nor to guide or monitor treatment for MRSA infections. Performed at [facility], [place] [address] [place],  [address]     Procedures and diagnostic studies:  No results found.  Medications:   . sodium chloride   Intravenous Once  . acidophilus  1 capsule Oral Daily  . atorvastatin  40 mg Oral Daily  . carvedilol  3.125 mg Oral BID WC  . cephALEXin  500 mg Oral Once  . citalopram  10 mg Oral Daily  . donepezil  5 mg Oral QHS  . enoxaparin (LOVENOX) injection  30 mg Subcutaneous Q24H  . insulin aspart  0-15 Units Subcutaneous TID AC & HS  . levothyroxine  125  mcg Oral Daily  . pantoprazole (PROTONIX) IV  40 mg Intravenous Q12H   Continuous Infusions:    LOS: 4 days   Geradine Girt  [facility]   How to contact the [facility] Attending or Consulting provider [place] or covering provider during after hours [place], for this patient?  1. Check the care team in [facility] and look for a) attending/consulting TR[facility] provider listed and b) the [facility] team listed 2. Log into [email or web address] and use 's universal password to access. If you do not have the password, please contact the hospital operator. 3. Locate the [facility] provider you are looking for under [facility] and page to a number that you can be directly reached. 4. If you still have difficulty reaching the provider, please page the [facility] (Director on Call) for the Hospitalists listed on amion for assistance.  05/18/2020, 11:24 AM

## 2020-05-19 LAB — BASIC METABOLIC PANEL
Anion gap: 12 (ref 5–15)
BUN: 42 mg/dL — ABNORMAL HIGH (ref 8–23)
CO2: 19 mmol/L — ABNORMAL LOW (ref 22–32)
Calcium: 7.1 mg/dL — ABNORMAL LOW (ref 8.9–10.3)
Chloride: 107 mmol/L (ref 98–111)
Creatinine, Ser: 1.81 mg/dL — ABNORMAL HIGH (ref 0.44–1.00)
GFR calc Af Amer: 29 mL/min — ABNORMAL LOW (ref >60)
GFR calc non Af Amer: 25 mL/min — ABNORMAL LOW (ref >60)
Glucose, Bld: 134 mg/dL — ABNORMAL HIGH (ref 70–99)
Potassium: 4 mmol/L (ref 3.5–5.1)
Sodium: 138 mmol/L (ref 135–145)

## 2020-05-19 LAB — TYPE AND SCREEN
ABO/RH(D): A POS
Antibody Screen: NEGATIVE
Unit division: 0

## 2020-05-19 LAB — CBC
HCT: 25.4 % — ABNORMAL LOW (ref 36.0–46.0)
Hemoglobin: 8 g/dL — ABNORMAL LOW (ref 12.0–15.0)
MCH: 30.5 pg (ref 26.0–34.0)
MCHC: 31.5 g/dL (ref 30.0–36.0)
MCV: 96.9 fL (ref 80.0–100.0)
Platelets: 149 10*3/uL — ABNORMAL LOW (ref 150–400)
RBC: 2.62 MIL/uL — ABNORMAL LOW (ref 3.87–5.11)
RDW: 16.4 % — ABNORMAL HIGH (ref 11.5–15.5)
WBC: 10 10*3/uL (ref 4.0–10.5)
nRBC: 0 % (ref 0.0–0.2)

## 2020-05-19 LAB — GLUCOSE, CAPILLARY
Glucose-Capillary: 82 mg/dL (ref 70–99)
Glucose-Capillary: 128 mg/dL — ABNORMAL HIGH (ref 70–99)
Glucose-Capillary: 149 mg/dL — ABNORMAL HIGH (ref 70–99)
Glucose-Capillary: 135 mg/dL — ABNORMAL HIGH (ref 70–99)

## 2020-05-19 LAB — BPAM RBC
Blood Product Expiration Date: 202109032359
ISSUE DATE / TIME: 202108291356
Unit Type and Rh: 6200

## 2020-05-19 LAB — SEDIMENTATION RATE: Sed Rate: 20 mm/hr (ref 0–22)

## 2020-05-19 LAB — C-REACTIVE PROTEIN: CRP: 8.5 mg/dL — ABNORMAL HIGH (ref <1.0)

## 2020-05-19 LAB — URIC ACID: Uric Acid, Serum: 7.2 mg/dL — ABNORMAL HIGH (ref 2.5–7.1)

## 2020-05-19 MED ORDER — PANTOPRAZOLE SODIUM 40 MG PO TBEC
40.0000 mg | DELAYED_RELEASE_TABLET | Freq: Every day | ORAL | Status: DC
  Administered 2020-05-20 – 2020-05-22 (×3): 40 mg via ORAL
  Filled 2020-05-19 (×3): qty 1

## 2020-05-19 MED ORDER — PREDNISONE 20 MG PO TABS: 20.0000 mg | ORAL_TABLET | Freq: Every day | ORAL | Status: DC

## 2020-05-19 MED ORDER — CEPHALEXIN 500 MG PO CAPS: 500.0000 mg | ORAL_CAPSULE | Freq: Every day | ORAL | Status: DC

## 2020-05-19 MED ORDER — FUROSEMIDE 20 MG PO TABS
20.0000 mg | ORAL_TABLET | Freq: Every day | ORAL | Status: DC
  Administered 2020-05-19 – 2020-05-23 (×5): 20 mg via ORAL
  Filled 2020-05-19 (×6): qty 1

## 2020-05-19 MED ORDER — PANTOPRAZOLE SODIUM 40 MG PO TBEC: 40.0000 mg | DELAYED_RELEASE_TABLET | Freq: Every day | ORAL | Status: DC

## 2020-05-19 MED ORDER — PREDNISONE 20 MG PO TABS
40.0000 mg | ORAL_TABLET | Freq: Every day | ORAL | Status: DC
  Administered 2020-05-19 – 2020-05-20 (×2): 40 mg via ORAL
  Filled 2020-05-19 (×2): qty 2

## 2020-05-19 MED ORDER — PANTOPRAZOLE SODIUM 20 MG PO TBEC: 20.0000 mg | DELAYED_RELEASE_TABLET | Freq: Every day | ORAL | Status: DC

## 2020-05-19 NOTE — Progress Notes (Signed)
Physical Therapy Treatment Patient Details Name: Connie [name] MRN: [id number] DOB: [birth year] Today's Date: 05/19/2020    History of Present Illness The pt is an 86 yo female presenting from [facility] (ALF) with AMS. Upon work-up, pt found to have AKI, and UA is suggestive of UTI. PMH includes: CHF, hypothyroidism, DM II, GI bleed with chronic anemia, HLD, HTN.    PT Comments    The pt is continuing to make good progress with therapy at this time, she was able to significantly improve her ambulation tolerance today, especially with the use of shoes (instead of hospital socks) as this reduced the pain she was feeling in her feet. Furthermore, the pt was able to complete a series of exercises for the LE with good technique and was given a printed HEP of the exercises to improve adherence outside of therapy sessions due to limitations in STM. The pt will continue to benefit from skilled PT to progress functional strength for transfers, endurance, and dynamic stability.     Follow Up Recommendations  Supervision/Assistance - 24 hour;Home health PT (return to [facility])     Equipment Recommendations  None recommended by PT    Recommendations for Other Services       Precautions / Restrictions Precautions Precautions: Fall Restrictions Weight Bearing Restrictions: No    Mobility  Bed Mobility Overal bed mobility: Needs Assistance Bed Mobility: Supine to Sit     Supine to sit: Supervision     General bed mobility comments: supervision with VC for sequencing, but no physical assist given. no assist needed to steady in sitting either  Transfers Overall transfer level: Needs assistance Equipment used: Rolling walker (2 wheeled) Transfers: Sit to/from [facility] Sit to Stand: Min guard Stand pivot transfers: Min guard       General transfer comment: initially minG to rise, progressed to supervision with reps. continues to benefit from  [facility] for hand positioning.  Ambulation/Gait Ambulation/Gait assistance: Min guard Gait Distance (Feet): 45 Feet (+ 25 ft) Assistive device: Rolling walker (2 wheeled) Gait Pattern/deviations: Step-through pattern;Decreased stride length;Shuffle;Narrow base of support;Trunk flexed Gait velocity: decreased Gait velocity interpretation: <1.31 ft/sec, indicative of household ambulator General Gait Details: pt taking short steps with minimal clearance and landing on flat feet. Reports pain in bilateral feet with gait, reduced pain with use of shoes. no LOB, but heavy reliance on BUE     Balance Overall balance assessment: Needs assistance Sitting-balance support: No upper extremity supported;Feet supported Sitting balance-Leahy Scale: Good     Standing balance support: Bilateral upper extremity supported;During functional activity Standing balance-Leahy Scale: Poor Standing balance comment: reliant on BUE support                            Cognition Arousal/Alertness: Awake/alert Behavior During Therapy: WFL for tasks assessed/performed Overall Cognitive Status: Impaired/Different from baseline Area of Impairment: Memory                     Memory: Decreased recall of precautions;Decreased short-term memory         General Comments: pt generally functional conversationally, but with continued STM deficits noteable through the session. Asking for repeated instructions.      Exercises General Exercises - Lower Extremity Ankle Circles/Pumps: AROM;Both;15 reps;Seated Long Arc Quad: AROM;Both;10 reps;Seated;Strengthening (x5 AROM, x5 against min/mod manual resistance) Heel Slides: AROM;Strengthening;Seated;Both;10 reps (x5 AROM, x5 against min/mod manual resistance) Hip ABduction/ADduction: AROM;Both;10 reps;Seated Straight Leg  Raises: AROM;Seated;Both;10 reps Heel Raises: AROM;Both;5 reps;Standing    General Comments General comments (skin integrity, edema,  etc.): Pt HOH and unable to read small print. HEP given due to pt STM deficits, but the pt reports being unable to read "large" printed text, does not have glassess.      Pertinent Vitals/Pain Pain Assessment: Faces Faces Pain Scale: Hurts little more Pain Location: bilateral feet (reduced with wearing shoes instead of hospital socks) Pain Descriptors / Indicators: Grimacing;Sore Pain Intervention(s): Limited activity within patient's tolerance;Monitored during session;Repositioned;Other (comment) (walked with shoes)           PT Goals (current goals can now be found in the care plan section) Acute Rehab PT Goals Patient Stated Goal: get into the sunshine PT Goal Formulation: With patient Time For Goal Achievement: 05/30/20 Potential to Achieve Goals: Good Progress towards PT goals: Progressing toward goals    Frequency    Min 2X/week      PT Plan Current plan remains appropriate       AM-PAC PT "6 Clicks" Mobility   Outcome Measure  Help needed turning from your back to your side while in a flat bed without using bedrails?: None Help needed moving from lying on your back to sitting on the side of a flat bed without using bedrails?: A Little Help needed moving to and from a bed to a chair (including a wheelchair)?: A Little Help needed standing up from a chair using your arms (e.g., wheelchair or bedside chair)?: A Little Help needed to walk in hospital room?: A Little Help needed climbing 3-5 steps with a railing? : A Lot 6 Click Score: 18    End of Session Equipment Utilized During Treatment: Gait belt Activity Tolerance: Patient tolerated treatment well Patient left: in chair;with chair alarm set;with call bell/phone within reach Nurse Communication: Mobility status PT Visit Diagnosis: Muscle weakness (generalized) (M62.81);Difficulty in walking, not elsewhere classified (R26.2)     Time: 6950-7225 PT Time Calculation (min) (ACUTE ONLY): 43 min  Charges:   $Gait Training: 23-37 mins $Therapeutic Exercise: 8-22 mins                     Karma Ganja, PT, DPT   Acute Rehabilitation Department Pager #: [phone]    Otho Bellows 05/19/2020, 4:38 PM

## 2020-05-19 NOTE — Consult Note (Addendum)
Reason for Consult:Left long finger pain Referring Physician: Faatimah Spielberg is an 86 y.o. female.  HPI: Tiffanny was admitted 4d ago with a UTI that had left her confused. She's been slowly improving but developed some left long finger pain and swelling. No prior hx/o similar or hx/o trauma but pt is extremely poor historian. She is RHD.  Past Medical History:  Diagnosis Date  . Allergy    RHINITIS  . Angiodysplasia of duodenum    hx/notes 11/21/2015  . Angiodysplasia of stomach    hx/notes 11/21/2015  . Arthritis    "joints ache"  . Bleeding gastric ulcer    hx/notes 11/21/2015  . Chronic blood loss anemia    secondary to angiodysplasia of the stomach and duodenum as well as history of bleeding gastric ulcer hx/notes 11/21/2015  . Chronic systolic CHF (congestive heart failure) ([place]) 12/23/2015  . History of blood transfusion 01/2015; 06/2015; 11/21/2015  . Hypercholesteremia   . Hypertension   . Hypothyroidism   . Obesity   . Pneumonia "several times"  . Type II diabetes mellitus ([place])     Past Surgical History:  Procedure Laterality Date  . BIOPSY  07/24/2018   Procedure: BIOPSY;  Surgeon: Carol Ada, MD;  [facility]: [facility];  Service: Endoscopy;;  . CARDIAC CATHETERIZATION N/A 12/19/2015   Procedure: Left Heart Cath and Coronary Angiography;  Surgeon: Leonie Man, MD;  Location: [facility];  Service: Cardiovascular;  Laterality: N/A;  . COLONOSCOPY N/A 08/30/2014   Procedure: COLONOSCOPY;  Surgeon: Beryle Beams, MD;  [facility];  Service: Endoscopy;  Laterality: N/A;  . COLONOSCOPY N/A 02/21/2015   Procedure: COLONOSCOPY;  Surgeon: Carol Ada, MD;  Location: [facility] ENDOSCOPY;  Service: Endoscopy;  Laterality: N/A;  . COLONOSCOPY WITH PROPOFOL N/A 07/24/2018   Procedure: COLONOSCOPY WITH PROPOFOL;  Surgeon: Carol Ada, MD;  [facility];  Service: Endoscopy;  Laterality: N/A;  . ENTEROSCOPY N/A 06/06/2015   Procedure:  ENTEROSCOPY;  Surgeon: Carol Ada, MD;  [facility];  Service: Endoscopy;  Laterality: N/A;  . ENTEROSCOPY N/A 07/24/2018   Procedure: ENTEROSCOPY;  Surgeon: Carol Ada, MD;  [facility];  Service: Endoscopy;  Laterality: N/A;  . FRACTURE SURGERY    . GIVENS CAPSULE STUDY N/A 05/15/2015   Procedure: GIVENS CAPSULE STUDY;  Surgeon: Carol Ada, MD;  Location: [facility]COPY;  Service: Endoscopy;  Laterality: N/A;  . HOT HEMOSTASIS N/A 08/30/2014   Procedure: HOT HEMOSTASIS (ARGON PLASMA COAGULATION/BICAP);  Surgeon: Beryle Beams, MD;  Location: [facility];  Service: Endoscopy;  Laterality: N/A;  . HOT HEMOSTASIS N/A 06/06/2015   Procedure: HOT HEMOSTASIS (ARGON PLASMA COAGULATION/BICAP);  Surgeon: Carol Ada, MD;  Location: [facility]OS[facility];  Service: Endoscopy;  Laterality: N/A;  . HOT HEMOSTASIS N/A 07/24/2018   Procedure: HOT HEMOSTASIS (ARGON PLASMA COAGULATION/BICAP);  Surgeon: Carol Ada, MD;  Location: [facility]OS[facility];  Service: Endoscopy;  Laterality: N/A;  . JOINT REPLACEMENT    . REVISION TOTAL KNEE ARTHROPLASTY Bilateral 2004-2015  . SHOULDER OPEN ROTATOR CUFF REPAIR Right 12/2004   open subacromial decompression, distal clavicle  resection, rotator cuff repair/notes 02/02/2011  . TONSILLECTOMY  1940s  . TOTAL KNEE ARTHROPLASTY Bilateral ~ 2002  . TOTAL THYROIDECTOMY  ~ 1966    Family History  Problem Relation Age of Onset  . Asthma Mother   . Ulcers Father   . Anemia Sister     Social History:  reports that she has never smoked. She has never used  smokeless tobacco. She reports that she does not drink alcohol and does not use drugs.  Allergies: No Known Allergies  Medications: I have reviewed the patient's current medications.  Results for orders placed or performed during the hospital encounter of 05/14/20 (from the past 48 hour(s))  Glucose, capillary     Status: Abnormal   Collection Time: 05/17/20  4:44 PM  Result Value Ref Range    Glucose-Capillary 144 (H) 70 - 99 mg/dL    Comment: Glucose reference range applies only to samples taken after fasting for at least 8 hours.  Glucose, capillary     Status: None   Collection Time: 05/17/20 10:02 PM  Result Value Ref Range   Glucose-Capillary 79 70 - 99 mg/dL    Comment: Glucose reference range applies only to samples taken after fasting for at least 8 hours.  Basic metabolic panel     Status: Abnormal   Collection Time: 05/18/20  1:36 AM  Result Value Ref Range   Sodium 140 135 - 145 mmol/L   Potassium 3.9 3.5 - 5.1 mmol/L   Chloride 108 98 - 111 mmol/L   CO2 21 (L) 22 - 32 mmol/L   Glucose, Bld 95 70 - 99 mg/dL    Comment: Glucose reference range applies only to samples taken after fasting for at least 8 hours.   BUN 54 (H) 8 - 23 mg/dL   Creatinine, Ser 1.89 (H) 0.44 - 1.00 mg/dL   Calcium 7.2 (L) 8.9 - 10.3 mg/dL   GFR calc non Af Amer 24 (L) >60 mL/min   GFR calc Af Amer 28 (L) >60 mL/min   Anion gap 11 5 - 15    Comment: Performed at [facility] [address], [place], [place] [address]  CBC     Status: Abnormal   Collection Time: 05/18/20  1:36 AM  Result Value Ref Range   WBC 8.4 4.0 - 10.5 K/uL   RBC 2.33 (L) 3.87 - 5.11 MIL/uL   Hemoglobin 7.3 (L) 12.0 - 15.0 g/dL   HCT 23.3 (L) 36 - 46 %   MCV 100.0 80.0 - 100.0 fL   MCH 31.3 26.0 - 34.0 pg   MCHC 31.3 30.0 - 36.0 g/dL   RDW 14.3 11.5 - 15.5 %   Platelets 161 150 - 400 K/uL   nRBC 0.0 0.0 - 0.2 %    Comment: Performed at [facility], [place] [address] [place], [place] [address]  Glucose, capillary     Status: None   Collection Time: 05/18/20  6:39 AM  Result Value Ref Range   Glucose-Capillary 93 70 - 99 mg/dL    Comment: Glucose reference range applies only to samples taken after fasting for at least 8 hours.  Occult blood card to lab, stool     Status: Abnormal   Collection Time: 05/18/20  9:07 AM  Result Value Ref Range   Fecal Occult Bld POSITIVE (A) NEGATIVE    Comment:  Performed at [facility], [address], [place], [place] [address]  Glucose, capillary     Status: Abnormal   Collection Time: 05/18/20 11:02 AM  Result Value Ref Range   Glucose-Capillary 140 (H) 70 - 99 mg/dL    Comment: Glucose reference range applies only to samples taken after fasting for at least 8 hours.  Prepare RBC (crossmatch)     Status: None   Collection Time: 05/18/20 11:42 AM  Result Value Ref Range   Order  Confirmation      ORDER PROCESSED BY BLOOD BANK Performed at [facility], [place] [address] [place], [place] [address]   Type and screen [place]     Status: None   Collection Time: 05/18/20 11:42 AM  Result Value Ref Range   ABO/RH(D) A POS    Antibody Screen NEG    Sample Expiration 05/21/2020,2359    Unit Number [id number]    Blood Component Type RED CELLS,LR    Unit division 00    Status of Unit ISSUED,FINAL    Transfusion Status OK TO TRANSFUSE    Crossmatch Result      Compatible Performed at [facility], [place] [address], [place], [place] [address]   Glucose, capillary     Status: None   Collection Time: 05/18/20  4:05 PM  Result Value Ref Range   Glucose-Capillary 83 70 - 99 mg/dL    Comment: Glucose reference range applies only to samples taken after fasting for at least 8 hours.  Hemoglobin and hematocrit, blood     Status: Abnormal   Collection Time: 05/18/20  8:13 PM  Result Value Ref Range   Hemoglobin 8.3 (L) 12.0 - 15.0 g/dL   HCT 26.6 (L) 36 - 46 %    Comment: Performed at [facility] [address] [place], [place] [address]  Glucose, capillary     Status: Abnormal   Collection Time: 05/18/20  9:46 PM  Result Value Ref Range   Glucose-Capillary 143 (H) 70 - 99 mg/dL    Comment: Glucose reference range applies only to samples taken after fasting for at least 8 hours.  Glucose, capillary     Status: None   Collection Time: 05/19/20  6:48 AM  Result Value Ref Range   Glucose-Capillary 82  70 - 99 mg/dL    Comment: Glucose reference range applies only to samples taken after fasting for at least 8 hours.  CBC     Status: Abnormal   Collection Time: 05/19/20  8:17 AM  Result Value Ref Range   WBC 10.0 4.0 - 10.5 K/uL   RBC 2.62 (L) 3.87 - 5.11 MIL/uL   Hemoglobin 8.0 (L) 12.0 - 15.0 g/dL   HCT 25.4 (L) 36 - 46 %   MCV 96.9 80.0 - 100.0 fL   MCH 30.5 26.0 - 34.0 pg   MCHC 31.5 30.0 - 36.0 g/dL   RDW 16.4 (H) 11.5 - 15.5 %   Platelets 149 (L) 150 - 400 K/uL   nRBC 0.0 0.0 - 0.2 %    Comment: Performed at [facility] [address], [place], [place] [address]  Basic metabolic panel     Status: Abnormal   Collection Time: 05/19/20  8:17 AM  Result Value Ref Range   Sodium 138 135 - 145 mmol/L   Potassium 4.0 3.5 - 5.1 mmol/L   Chloride 107 98 - 111 mmol/L   CO2 19 (L) 22 - 32 mmol/L   Glucose, Bld 134 (H) 70 - 99 mg/dL    Comment: Glucose reference range applies only to samples taken after fasting for at least 8 hours.   BUN 42 (H) 8 - 23 mg/dL   Creatinine, Ser 1.81 (H) 0.44 - 1.00 mg/dL   Calcium 7.1 (L) 8.9 - 10.3 mg/dL   GFR calc non Af Amer 25 (L) >60 mL/min   GFR calc Af Amer 29 (L) >60 mL/min   Anion gap 12 5 -  15    Comment: Performed at [facility], [place] [address] [place], [place] [address]  Glucose, capillary     Status: Abnormal   Collection Time: 05/19/20 11:03 AM  Result Value Ref Range   Glucose-Capillary 128 (H) 70 - 99 mg/dL    Comment: Glucose reference range applies only to samples taken after fasting for at least 8 hours.    DG Finger Middle Left  Result Date: 05/18/2020 CLINICAL DATA:  Pt reports pain in left middle finger. During exam pt replaced the ring on her finger after initial removal. EXAM: LEFT MIDDLE FINGER 2+V COMPARISON:  None. FINDINGS: Osteopenia. There is no evidence of fracture or dislocation. Moderate degenerative changes. Soft tissues are unremarkable. IMPRESSION: No acute osseous abnormality in the left middle  finger. Electronically Signed   By: Audie Pinto M.D.   On: 05/18/2020 14:03    Review of Systems  Constitutional: Negative for chills, diaphoresis and fever.  HENT: Negative for ear discharge, ear pain, hearing loss and tinnitus.   Eyes: Negative for photophobia and pain.  Respiratory: Negative for cough and shortness of breath.   Cardiovascular: Negative for chest pain.  Gastrointestinal: Negative for abdominal pain, nausea and vomiting.  Genitourinary: Negative for dysuria, flank pain, frequency and urgency.  Musculoskeletal: Positive for arthralgias (Left long finger). Negative for back pain, myalgias and neck pain.  Neurological: Negative for dizziness and headaches.  Hematological: Does not bruise/bleed easily.  Psychiatric/Behavioral: Positive for confusion. The patient is not nervous/anxious.    Blood pressure (!) 149/50, pulse 69, temperature 99.2 F (37.3 C), temperature source Oral, resp. rate 18, height 4' 11.02" (1.499 m), weight 56.6 kg, SpO2 98 %. Physical Exam Constitutional:      General: She is not in acute distress.    Appearance: She is well-developed. She is not diaphoretic.  HENT:     Head: Normocephalic and atraumatic.  Eyes:     General: No scleral icterus.       Right eye: No discharge.        Left eye: No discharge.     Conjunctiva/sclera: Conjunctivae normal.  Cardiovascular:     Rate and Rhythm: Normal rate and regular rhythm.  Pulmonary:     Effort: Pulmonary effort is normal. No respiratory distress.  Musculoskeletal:     Cervical back: Normal range of motion.     Comments: Left shoulder, elbow, wrist, digits- no skin wounds, fusiform edema long finger P1/2, TTP P1/2, volar pain with passive extension, finger held in minimal flexion, pain with PROM of PIP/DIP, no instability, no blocks to motion  Sens  Ax/R/M/U intact  Mot   Ax/ R/ PIN/ M/ AIN/ U intact  Rad 2+  Skin:    General: Skin is warm and dry.  Neurological:     Mental Status: She  is alert.  Psychiatric:        Behavior: Behavior normal.     Assessment/Plan: Left long finger pain -- Exam is worrisome for flexor tenosynovitis given she has 4/4 [place] signs (although 2 are subtle). Will have Dr. [place] Silos evaluate later today. Multiple medical problems including systolic and diastolic congestive heart failure (EF 55-60% 2018), hypothyroidism, diabetes mellitus type 2,  GI bleeding (2016 and 07/2018) with chronic iron deficiency anemia, hyperlipidemia, hypertension -- per primary service    Lisette Abu, PA-C Orthopedic Surgery [phone] 05/19/2020, 1:41 PM    Reason for Consult:Left long finger pain Referring Physician: Gates Rigg is an 86 y.o.  female whoo was admitted 4d ago with a presumed UTI that had left her with some MS changes. She's been slowly improving but developed some left long finger pain and swelling. No prior hx/o similar or hx/o trauma but pt is extremely poor historian. She is RHD.      Past Medical History:  Diagnosis Date  . Allergy    RHINITIS  . Angiodysplasia of duodenum    hx/notes 11/21/2015  . Angiodysplasia of stomach    hx/notes 11/21/2015  . Arthritis    "joints ache"  . Bleeding gastric ulcer    hx/notes 11/21/2015  . Chronic blood loss anemia    secondary to angiodysplasia of the stomach and duodenum as well as history of bleeding gastric ulcer hx/notes 11/21/2015  . Chronic systolic CHF (congestive heart failure) ([place]) 12/23/2015  . History of blood transfusion 01/2015; 06/2015; 11/21/2015  . Hypercholesteremia   . Hypertension   . Hypothyroidism   . Obesity   . Pneumonia "several times"  . Type II diabetes mellitus ([place])          Past Surgical History:  Procedure Laterality Date  . BIOPSY  07/24/2018   Procedure: BIOPSY;  Surgeon: Carol Ada, MD;  [facility];  Service: Endoscopy;;  . CARDIAC CATHETERIZATION N/A 12/19/2015   Procedure: Left Heart Cath and  Coronary Angiography;  Surgeon: Leonie Man, MD;  Location: [facility];  Service: Cardiovascular;  Laterality: N/A;  . COLONOSCOPY N/A 08/30/2014   Procedure: COLONOSCOPY;  Surgeon: Beryle Beams, MD;  [facility];  Service: Endoscopy;  Laterality: N/A;  . COLONOSCOPY N/A 02/21/2015   Procedure: COLONOSCOPY;  Surgeon: Carol Ada, MD;  Location: [facility] ENDOSCOPY;  Service: Endoscopy;  Laterality: N/A;  . COLONOSCOPY WITH PROPOFOL N/A 07/24/2018   Procedure: COLONOSCOPY WITH PROPOFOL;  Surgeon: Carol Ada, MD;  [facility];  Service: Endoscopy;  Laterality: N/A;  . ENTEROSCOPY N/A 06/06/2015   Procedure: ENTEROSCOPY;  Surgeon: Carol Ada, MD;  [facility];  Service: Endoscopy;  Laterality: N/A;  . ENTEROSCOPY N/A 07/24/2018   Procedure: ENTEROSCOPY;  Surgeon: Carol Ada, MD;  [facility];  Service: Endoscopy;  Laterality: N/A;  . FRACTURE SURGERY    . GIVENS CAPSULE STUDY N/A 05/15/2015   Procedure: GIVENS CAPSULE STUDY;  Surgeon: Carol Ada, MD;  Location: [facility] ENDOSCOPY;  Service: Endoscopy;  Laterality: N/A;  . HOT HEMOSTASIS N/A 08/30/2014   Procedure: HOT HEMOSTASIS (ARGON PLASMA COAGULATION/BICAP);  Surgeon: Beryle Beams, MD;  Location: [facility] ENDOSCOPY;  Service: Endoscopy;  Laterality: N/A;  . HOT HEMOSTASIS N/A 06/06/2015   Procedure: HOT HEMOSTASIS (ARGON PLASMA COAGULATION/BICAP);  Surgeon: Carol Ada, MD;  Location: [facility] ENDOSCOPY;  Service: Endoscopy;  Laterality: N/A;  . HOT HEMOSTASIS N/A 07/24/2018   Procedure: HOT HEMOSTASIS (ARGON PLASMA COAGULATION/BICAP);  Surgeon: Carol Ada, MD;  Location: [facility] ENDOSCOPY;  Service: Endoscopy;  Laterality: N/A;  . JOINT REPLACEMENT    . REVISION TOTAL KNEE ARTHROPLASTY Bilateral 2004-2015  . SHOULDER OPEN ROTATOR CUFF REPAIR Right 12/2004   open subacromial decompression, distal clavicle  resection, rotator cuff repair/notes 02/02/2011  . TONSILLECTOMY  1940s  . TOTAL KNEE  ARTHROPLASTY Bilateral ~ 2002  . TOTAL THYROIDECTOMY  ~ 1966         Family History  Problem Relation Age of Onset  . Asthma Mother   . Ulcers Father   . Anemia Sister     Social History:  reports that she has never smoked. She has never used smokeless  tobacco. She reports that she does not drink alcohol and does not use drugs.  Allergies: No Known Allergies  Medications: I have reviewed the patient's current medications.  Lab Results Last 48 Hours        Results for orders placed or performed during the hospital encounter of 05/14/20 (from the past 48 hour(s))  Glucose, capillary     Status: Abnormal   Collection Time: 05/17/20  4:44 PM  Result Value Ref Range   Glucose-Capillary 144 (H) 70 - 99 mg/dL    Comment: Glucose reference range applies only to samples taken after fasting for at least 8 hours.  Glucose, capillary     Status: None   Collection Time: 05/17/20 10:02 PM  Result Value Ref Range   Glucose-Capillary 79 70 - 99 mg/dL    Comment: Glucose reference range applies only to samples taken after fasting for at least 8 hours.  Basic metabolic panel     Status: Abnormal   Collection Time: 05/18/20  1:36 AM  Result Value Ref Range   Sodium 140 135 - 145 mmol/L   Potassium 3.9 3.5 - 5.1 mmol/L   Chloride 108 98 - 111 mmol/L   CO2 21 (L) 22 - 32 mmol/L   Glucose, Bld 95 70 - 99 mg/dL    Comment: Glucose reference range applies only to samples taken after fasting for at least 8 hours.   BUN 54 (H) 8 - 23 mg/dL   Creatinine, Ser 1.89 (H) 0.44 - 1.00 mg/dL   Calcium 7.2 (L) 8.9 - 10.3 mg/dL   GFR calc non Af Amer 24 (L) >60 mL/min   GFR calc Af Amer 28 (L) >60 mL/min   Anion gap 11 5 - 15    Comment: Performed at [facility] [address], [place], [place] [address]  CBC     Status: Abnormal   Collection Time: 05/18/20  1:36 AM  Result Value Ref Range   WBC 8.4 4.0 - 10.5 K/uL   RBC 2.33 (L) 3.87 - 5.11 MIL/uL    Hemoglobin 7.3 (L) 12.0 - 15.0 g/dL   HCT 23.3 (L) 36 - 46 %   MCV 100.0 80.0 - 100.0 fL   MCH 31.3 26.0 - 34.0 pg   MCHC 31.3 30.0 - 36.0 g/dL   RDW 14.3 11.5 - 15.5 %   Platelets 161 150 - 400 K/uL   nRBC 0.0 0.0 - 0.2 %    Comment: Performed at [facility], [place] [address], [place], [place] [address]  Glucose, capillary     Status: None   Collection Time: 05/18/20  6:39 AM  Result Value Ref Range   Glucose-Capillary 93 70 - 99 mg/dL    Comment: Glucose reference range applies only to samples taken after fasting for at least 8 hours.  Occult blood card to lab, stool     Status: Abnormal   Collection Time: 05/18/20  9:07 AM  Result Value Ref Range   Fecal Occult Bld POSITIVE (A) NEGATIVE    Comment: Performed at [facility], [address] [place],  [address]  Glucose, capillary     Status: Abnormal   Collection Time: 05/18/20 11:02 AM  Result Value Ref Range   Glucose-Capillary 140 (H) 70 - 99 mg/dL    Comment: Glucose reference range applies only to samples taken after fasting for at least 8 hours.  Prepare RBC (crossmatch)     Status: None   Collection Time: 05/18/20  11:42 AM  Result Value Ref Range   Order Confirmation      ORDER PROCESSED BY BLOOD BANK Performed at [facility], [place] [address], [place], [place] [address]   Type and screen [place]     Status: None   Collection Time: 05/18/20 11:42 AM  Result Value Ref Range   ABO/RH(D) A POS    Antibody Screen NEG    Sample Expiration 05/21/2020,2359    Unit Number [id number]    Blood Component Type RED CELLS,LR    Unit division 00    Status of Unit ISSUED,FINAL    Transfusion Status OK TO TRANSFUSE    Crossmatch Result      Compatible Performed at [facility], [place] [address], [place], [place] [address]   Glucose, capillary     Status: None   Collection Time: 05/18/20  4:05 PM  Result Value Ref Range    Glucose-Capillary 83 70 - 99 mg/dL    Comment: Glucose reference range applies only to samples taken after fasting for at least 8 hours.  Hemoglobin and hematocrit, blood     Status: Abnormal   Collection Time: 05/18/20  8:13 PM  Result Value Ref Range   Hemoglobin 8.3 (L) 12.0 - 15.0 g/dL   HCT 26.6 (L) 36 - 46 %    Comment: Performed at [facility] [address] [place], [place] [address]  Glucose, capillary     Status: Abnormal   Collection Time: 05/18/20  9:46 PM  Result Value Ref Range   Glucose-Capillary 143 (H) 70 - 99 mg/dL    Comment: Glucose reference range applies only to samples taken after fasting for at least 8 hours.  Glucose, capillary     Status: None   Collection Time: 05/19/20  6:48 AM  Result Value Ref Range   Glucose-Capillary 82 70 - 99 mg/dL    Comment: Glucose reference range applies only to samples taken after fasting for at least 8 hours.  CBC     Status: Abnormal   Collection Time: 05/19/20  8:17 AM  Result Value Ref Range   WBC 10.0 4.0 - 10.5 K/uL   RBC 2.62 (L) 3.87 - 5.11 MIL/uL   Hemoglobin 8.0 (L) 12.0 - 15.0 g/dL   HCT 25.4 (L) 36 - 46 %   MCV 96.9 80.0 - 100.0 fL   MCH 30.5 26.0 - 34.0 pg   MCHC 31.5 30.0 - 36.0 g/dL   RDW 16.4 (H) 11.5 - 15.5 %   Platelets 149 (L) 150 - 400 K/uL   nRBC 0.0 0.0 - 0.2 %    Comment: Performed at [facility] [address], [place], [place] [address]  Basic metabolic panel     Status: Abnormal   Collection Time: 05/19/20  8:17 AM  Result Value Ref Range   Sodium 138 135 - 145 mmol/L   Potassium 4.0 3.5 - 5.1 mmol/L   Chloride 107 98 - 111 mmol/L   CO2 19 (L) 22 - 32 mmol/L   Glucose, Bld 134 (H) 70 - 99 mg/dL    Comment: Glucose reference range applies only to samples taken after fasting for at least 8 hours.   BUN 42 (H) 8 - 23 mg/dL   Creatinine, Ser 1.81 (H) 0.44 - 1.00 mg/dL   Calcium 7.1 (L) 8.9 - 10.3 mg/dL   GFR calc non Af Amer 25 (L) >60  mL/min   GFR calc Af [place]nia Lora  29 (L) >60 mL/min   Anion gap 12 5 - 15    Comment: Performed at [facility] [address] [place], [place] [address]  Glucose, capillary     Status: Abnormal   Collection Time: 05/19/20 11:03 AM  Result Value Ref Range   Glucose-Capillary 128 (H) 70 - 99 mg/dL    Comment: Glucose reference range applies only to samples taken after fasting for at least 8 hours.       Imaging Results (Last 48 hours)  DG Finger Middle Left  Result Date: 05/18/2020 CLINICAL DATA:  Pt reports pain in left middle finger. During exam pt replaced the ring on her finger after initial removal. EXAM: LEFT MIDDLE FINGER 2+V COMPARISON:  None. FINDINGS: Osteopenia. There is no evidence of fracture or dislocation. Moderate degenerative changes. Soft tissues are unremarkable. IMPRESSION: No acute osseous abnormality in the left middle finger. Electronically Signed   By: Audie Pinto M.D.   On: 05/18/2020 14:03     Review of Systems  Constitutional: Negative for chills, diaphoresis and fever.  HENT: Negative for ear discharge, ear pain, hearing loss and tinnitus.   Eyes: Negative for photophobia and pain.  Respiratory: Negative for cough and shortness of breath.   Cardiovascular: Negative for chest pain.  Gastrointestinal: Negative for abdominal pain, nausea and vomiting.  Genitourinary: Negative for dysuria, flank pain, frequency and urgency.  Musculoskeletal: Positive for arthralgias (Left long finger). Negative for back pain, myalgias and neck pain.  Neurological: Negative for dizziness and headaches.  Hematological: Does not bruise/bleed easily.  Psychiatric/Behavioral: Positive for confusion. The patient is not nervous/anxious.    Blood pressure (!) 149/50, pulse 69, temperature 99.2 F (37.3 C), temperature source Oral, resp. rate 18, height 4' 11.02" (1.499 m), weight 56.6 kg, SpO2 98 %. Physical Exam Constitutional:      General: She is not in acute  distress.    Appearance: She is well-developed. She is not diaphoretic.  HENT:     Head: Normocephalic and atraumatic, conversant Eyes:     General: No scleral icterus.       Right eye: No discharge.        Left eye: No discharge.     Conjunctiva/sclera: Conjunctivae normal.  Cardiovascular:     Rate and Rhythm: Normal rate and regular rhythm.  Pulmonary:     Effort: Pulmonary effort is normal. No respiratory distress.  Musculoskeletal:     Cervical back: Normal range of motion.     Comments: Left shoulder, elbow, wrist, digits- no skin wounds, fusiform-type edema LF P1/P2.  TTP dorsal MP joint line, and side-to-side compression of PIPJ and DIPJ.  Digit held fully extended.  Increased pain with passive HE.  Pain with torsional stress to digit.  Less pain with pressure over flexor surface than at dorsal MP joint line or PIP medial-lateral compression.             Sens  Ax/R/M/U intact             Mot   Ax/ R/ PIN/ M/ AIN/ U intact  Left Hallux MTP reddened, swollen, TTP and pain with passive MPJ ROM  Assessment/Plan: Given clinical exam today with multifocal areas of inflammation (Left foot and hand), and anatomically widespread distribution of findings of L LF (MPJ and PIPJ in addition to flexor tendon system), development without an open wound or puncture wound,  it is my opinion that this more likely represents non-infectious inflammation than infectious flexor tenosynovitis or small  joint septic arthritis.  Labs such as UA and ESR/CRP still pending, although WBC not markedly elevated at 10.  I recommend empirically treating as presumed gout flare and monitoring response.  I remain happy to re-evaluate her condition should it change such that surgical considerations appear more likely.  Will sign-off for now.  Micheline Rough, MD Hand Surgery

## 2020-05-19 NOTE — Progress Notes (Signed)
Progress Note    Connie [name]  MHD:[id number] DOB: 1934-09-19  DOA: 05/14/2020 PCP: [name], MD    Brief Narrative:    Medical records reviewed and are as summarized below:  Connie [name] is an 86 y.o. female with past medical history of systolic and diastolic congestive heart failure (EF 55-60% 2018), hypothyroidism, diabetes mellitus type 2,  GI bleeding (2016 and 07/2018) with chronic iron deficiency anemia, hyperlipidemia, hypertension who presents to [facility] emergency department due to change in mental status.  Renal function slowly improving.   Assessment/Plan:   Principal Problem:   Acute kidney injury ([place]) Active Problems:   Hypothyroidism   Hyperlipidemia associated with type 2 diabetes mellitus ([place])   Controlled type 2 diabetes mellitus without complication, without long-term current use of insulin ([name])   Metabolic acidosis   Chronic combined systolic and diastolic congestive heart failure ([name])   GERD without esophagitis   Anemia   Complicated UTI (urinary tract infection)   Bradycardia   Acute metabolic encephalopathy   Hyperkalemia   Acute kidney injury due to UTI (e coli)  substantial acute kidney injury with creatinine of 4, increased from baseline of 1.38   IVF d/c'd as patient close to baseline  Strict input and output monitoring   renal ultrasound: medical renal disease, no blockage  Treating urinary tract infection with intravenous ceftriaxone- change to PO to finish 5 day course  Resume low dose lasix and monitor closely    Anemia  Patient is a longstanding known history of chronic iron deficiency anemia and has received intravenous iron infusions in the past  Of note, patient has a history of multiple gastrointestinal bleeds in the past, notably and 2016 and again in 2019.  Last endoscopic work-up in 07/2018 revealed gastritis via the EGD in addition to nonbleeding colonic angiodysplastic lesions via  colonoscopy.  Patient was Hemoccult positive in the emergency department.  Will transfuse 1 unit PRBC, most recent stool was brown and not black  GI doc is MAnn/Hung    Acute metabolic encephalopathy- but per son baseline is confused  Thought to be secondary to uremia considering markedly elevated BUN with concurrent complicated UTI  Hydrating with intravenous fluids to manage acute kidney injury while concurrently treating underlying urinary tract infection  Seems to be improving at at baseline    Hyperkalemia  resolved  Finger pain -xray negative for fracture -? Infection -ortho consult    Hypothyroidism  Continue home regimen of Synthroid    Chronic combined systolic and diastolic congestive heart failure ([name])  Resume lasix    GERD without esophagitis  protonix    Bradycardia  Substantial sinus bradycardia noted upon arrival, likely secondary to ongoing beta-blocker use  Coreg at a lower dose    Hyperlipidemia associated with type 2 diabetes mellitus ([place])  Continue home regimen statin therapy    Metabolic acidosis  Notable substantial metabolic acidosis based on chemistry  Likely secondary to degree of renal injury  resolved    Family Communication/Anticipated D/C date and plan/Code Status   DVT prophylaxis: Lovenox ordered. Code Status: Full Code.  Disposition Plan: Status is: Inpatient Son updated on phone 8/28 Remains inpatient appropriate because:Inpatient level of care appropriate due to severity of illness   Dispo: The patient is from: alf              Anticipated d/c is to: ALF              Anticipated d/c  date is: 3 days              Patient currently is not medically stable to d/c.         Medical Consultants:    ortho     Subjective:   Left finger still painful  Objective:    Vitals:   05/18/20 2148 05/18/20 2148 05/19/20 0607 05/19/20 0838  BP: (!) 143/48 (!) 143/48 (!) 147/43 (!) 149/50  Pulse: 65  70 74 69  Resp: 18 18 18 18   Temp: 99 F (37.2 C) 99 F (37.2 C) 99.6 F (37.6 C) 99.2 F (37.3 C)  TempSrc: Oral Oral Oral Oral  SpO2: 96% 96% 95% 98%  Weight:      Height:        Intake/Output Summary (Last 24 hours) at 05/19/2020 1343 Last data filed at 05/19/2020 1248 Gross per 24 hour  Intake 974 ml  Output 200 ml  Net 774 ml   Filed Weights   05/14/20 1740 05/15/20 1900  Weight: 52.2 kg 56.6 kg    Exam:  General: Appearance:     Overweight female in no acute distress     Lungs:     Slight crackles at bases, respirations unlabored  Heart:    Normal heart rate. Normal rhythm. No murmurs, rubs, or gallops.   MS:   All extremities are intact.   Neurologic:   Awake, alert     Data Reviewed:   I have personally reviewed following labs and imaging studies:  Labs: Labs show the following:   Basic Metabolic Panel: Recent Labs  Lab 05/15/20 0337 05/15/20 0956 05/15/20 2201 05/15/20 2201 05/16/20 0314 05/16/20 0314 05/17/20 0405 05/17/20 0405 05/18/20 0136 05/19/20 0817  NA 136   < > 141  --  139  --  138  --  140 138  K 4.9   < > 4.7   < > 4.2   < > 3.8   < > 3.9 4.0  CL 115*   < > 118*  --  116*  --  111  --  108 107  CO2 10*   < > 11*  --  14*  --  18*  --  21* 19*  GLUCOSE 105*   < > 72  --  132*  --  80  --  95 134*  BUN 96*   < > 91*  --  84*  --  70*  --  54* 42*  CREATININE 3.24*   < > 3.02*  --  2.77*  --  2.27*  --  1.89* 1.81*  CALCIUM 7.3*   < > 7.6*  --  7.5*  --  7.0*  --  7.2* 7.1*  MG 1.8  --   --   --   --   --   --   --   --   --    < > = values in this interval not displayed.   GFR Estimated Creatinine Clearance: 17.8 mL/min (A) (by C-G formula based on SCr of 1.81 mg/dL (H)). Liver Function Tests: Recent Labs  Lab 05/14/20 1830 05/15/20 0337  AST 14* 17  ALT 8 7  ALKPHOS 32* 32*  BILITOT 0.7 0.7  PROT 5.7* 5.1*  ALBUMIN 3.6 3.1*   Recent Labs  Lab 05/14/20 2322  LIPASE 29   No results for input(s): AMMONIA in the last  168 hours. Coagulation profile Recent Labs  Lab 05/14/20 2322  INR 1.4*    CBC:  Recent Labs  Lab 05/14/20 1830 05/14/20 2322 05/15/20 1159 05/15/20 1159 05/16/20 0314 05/17/20 0405 05/18/20 0136 05/18/20 2013 05/19/20 0817  WBC 5.7   < > 7.9  --  5.4 7.0 8.4  --  10.0  NEUTROABS 4.4  --   --   --   --   --   --   --   --   HGB 7.8*   < > 8.1*   < > 7.4* 7.5* 7.3* 8.3* 8.0*  HCT 25.8*   < > 27.3*   < > 24.1* 23.8* 23.3* 26.6* 25.4*  MCV 104.9*   < > 103.8*  --  101.3* 99.2 100.0  --  96.9  PLT 176   < > 173  --  168 176 161  --  149*   < > = values in this interval not displayed.   Cardiac Enzymes: No results for input(s): CKTOTAL, CKMB, CKMBINDEX, TROPONINI in the last 168 hours. BNP (last 3 results) No results for input(s): PROBNP in the last 8760 hours. CBG: Recent Labs  Lab 05/18/20 1102 05/18/20 1605 05/18/20 2146 05/19/20 0648 05/19/20 1103  GLUCAP 140* 83 143* 82 128*   D-Dimer: No results for input(s): DDIMER in the last 72 hours. Hgb A1c: No results for input(s): HGBA1C in the last 72 hours. Lipid Profile: No results for input(s): CHOL, HDL, LDLCALC, TRIG, CHOLHDL, LDLDIRECT in the last 72 hours. Thyroid function studies: No results for input(s): TSH, T4TOTAL, T3FREE, THYROIDAB in the last 72 hours.  Invalid input(s): FREET3 Anemia work up: No results for input(s): VITAMINB12, FOLATE, FERRITIN, TIBC, IRON, RETICCTPCT in the last 72 hours. Sepsis Labs: Recent Labs  Lab 05/14/20 2322 05/15/20 0337 05/16/20 0314 05/17/20 0405 05/18/20 0136 05/19/20 0817  WBC 5.8   < > 5.4 7.0 8.4 10.0  LATICACIDVEN 0.5  --   --   --   --   --    < > = values in this interval not displayed.    Microbiology Recent Results (from the past 240 hour(s))  SARS Coronavirus 2 by RT PCR (hospital order, performed in [facility] hospital lab) Nasopharyngeal Nasopharyngeal Swab     Status: None   Collection Time: 05/14/20  6:29 PM   Specimen: Nasopharyngeal Swab    Result Value Ref Range Status   SARS Coronavirus 2 NEGATIVE NEGATIVE Final    Comment: (NOTE) SARS-CoV-2 target nucleic acids are NOT DETECTED.  The SARS-CoV-2 RNA is generally detectable in upper and lower respiratory specimens during the acute phase of infection. The lowest concentration of SARS-CoV-2 viral copies this assay can detect is 250 copies / mL. A negative result does not preclude SARS-CoV-2 infection and should not be used as the sole basis for treatment or other patient management decisions.  A negative result may occur with improper specimen collection / handling, submission of specimen other than nasopharyngeal swab, presence of viral mutation(s) within the areas targeted by this assay, and inadequate number of viral copies (<250 copies / mL). A negative result must be combined with clinical observations, patient history, and epidemiological information.  Fact Sheet for Patients:   StrictlyIdeas.no  Fact Sheet for Healthcare Providers: BankingDealers.co.za  This test is not yet approved or  cleared by the [place] FDA and has been authorized for detection and/or diagnosis of SARS-CoV-2 by FDA under an Emergency Use Authorization (EUA).  This EUA will remain in effect (meaning this test can be used) for the duration of the COVID-19 declaration under Section 564(b)(1) of  the Act, 21 U.S.C. section 360bbb-3(b)(1), unless the authorization is terminated or revoked sooner.  Performed at [facility], [place] [address], [place], [place] [address]   Urine culture     Status: Abnormal   Collection Time: 05/14/20  6:56 PM   Specimen: Urine, Random  Result Value Ref Range Status   Specimen Description URINE, RANDOM  Final   Special Requests   Final    NONE Performed at [facility], [place] [address] [place], [place] [address]    Culture >=100,000 COLONIES/mL ESCHERICHIA COLI (A)  Final   Report Status  05/16/2020 FINAL  Final   Organism ID, Bacteria ESCHERICHIA COLI (A)  Final      Susceptibility   Escherichia coli - MIC*    AMPICILLIN 8 SENSITIVE Sensitive     CEFAZOLIN <=4 SENSITIVE Sensitive     CEFTRIAXONE <=0.25 SENSITIVE Sensitive     CIPROFLOXACIN <=0.25 SENSITIVE Sensitive     GENTAMICIN <=1 SENSITIVE Sensitive     IMIPENEM <=0.25 SENSITIVE Sensitive     NITROFURANTOIN 32 SENSITIVE Sensitive     TRIMETH/SULFA <=20 SENSITIVE Sensitive     AMPICILLIN/SULBACTAM <=2 SENSITIVE Sensitive     PIP/TAZO <=4 SENSITIVE Sensitive     * >=100,000 COLONIES/mL ESCHERICHIA COLI  Culture, blood (routine x 2)     Status: None (Preliminary result)   Collection Time: 05/14/20 11:22 PM   Specimen: BLOOD  Result Value Ref Range Status   Specimen Description BLOOD RIGHT ARM  Final   Special Requests   Final    BOTTLES DRAWN AEROBIC AND ANAEROBIC Blood Culture adequate volume   Culture   Final    NO GROWTH 4 DAYS Performed at [facility], [address], [place], [place] [address]    Report Status PENDING  Incomplete  Culture, blood (routine x 2)     Status: None (Preliminary result)   Collection Time: 05/14/20 11:22 PM   Specimen: BLOOD  Result Value Ref Range Status   Specimen Description BLOOD LEFT ARM  Final   Special Requests   Final    BOTTLES DRAWN AEROBIC AND ANAEROBIC Blood Culture adequate volume   Culture   Final    NO GROWTH 4 DAYS Performed at [facility], [place] [address], [place], [place] [address]    Report Status PENDING  Incomplete  MRSA PCR Screening     Status: None   Collection Time: 05/15/20  8:24 PM   Specimen: Nasopharyngeal  Result Value Ref Range Status   MRSA by PCR NEGATIVE NEGATIVE Final    Comment:        The GeneXpert MRSA Assay (FDA approved for NASAL specimens only), is one component of a comprehensive MRSA colonization surveillance program. It is not intended to diagnose MRSA infection nor to guide or monitor treatment for MRSA  infections. Performed at [facility], [place] [address], [place],  [address]     Procedures and diagnostic studies:  DG Finger Middle Left  Result Date: 05/18/2020 CLINICAL DATA:  Pt reports pain in left middle finger. During exam pt replaced the ring on her finger after initial removal. EXAM: LEFT MIDDLE FINGER 2+V COMPARISON:  None. FINDINGS: Osteopenia. There is no evidence of fracture or dislocation. Moderate degenerative changes. Soft tissues are unremarkable. IMPRESSION: No acute osseous abnormality in the left middle finger. Electronically Signed   By: Audie Pinto M.D.   On: 05/18/2020 14:03    Medications:    acidophilus  1 capsule Oral Daily   atorvastatin  40 mg Oral Daily   carvedilol  3.125 mg Oral BID WC   cephALEXin  500 mg Oral Daily   citalopram  10 mg Oral Daily   donepezil  5 mg Oral QHS   enoxaparin (LOVENOX) injection  30 mg Subcutaneous Q24H   furosemide  20 mg Oral Daily   insulin aspart  0-15 Units Subcutaneous TID AC & HS   levothyroxine  125 mcg Oral Daily   pantoprazole (PROTONIX) IV  40 mg Intravenous Q12H   Continuous Infusions:    LOS: 5 days   Geradine Girt  [facility]   How to contact the [facility] Attending or Consulting provider [place] or covering provider during after hours [place], for this patient?  1. Check the care team in [facility] and look for a) attending/consulting [facility] provider listed and b) the [facility] team listed 2. Log into [email or web address] and use [place]'s universal password to access. If you do not have the password, please contact the hospital operator. 3. Locate the [facility] provider you are looking for under [facility] and page to a number that you can be directly reached. 4. If you still have difficulty reaching the provider, please page the [facility] (Director on Call) for the Hospitalists listed on amion for assistance.  05/19/2020, 1:43 PM

## 2020-05-19 NOTE — Plan of Care (Signed)
  Problem: Education: Goal: Knowledge of General Education information will improve Description: Including pain rating scale, medication(s)/side effects and non-pharmacologic comfort measures Outcome: Progressing   Problem: Safety: Goal: Ability to remain free from injury will improve Outcome: Progressing   

## 2020-05-20 LAB — CULTURE, BLOOD (ROUTINE X 2)
Culture: NO GROWTH
Culture: NO GROWTH
Special Requests: ADEQUATE
Special Requests: ADEQUATE

## 2020-05-20 LAB — BASIC METABOLIC PANEL
Anion gap: 13 (ref 5–15)
BUN: 45 mg/dL — ABNORMAL HIGH (ref 8–23)
CO2: 18 mmol/L — ABNORMAL LOW (ref 22–32)
Calcium: 6.9 mg/dL — ABNORMAL LOW (ref 8.9–10.3)
Chloride: 105 mmol/L (ref 98–111)
Creatinine, Ser: 1.76 mg/dL — ABNORMAL HIGH (ref 0.44–1.00)
GFR calc Af Amer: 30 mL/min — ABNORMAL LOW (ref >60)
GFR calc non Af Amer: 26 mL/min — ABNORMAL LOW (ref >60)
Glucose, Bld: 157 mg/dL — ABNORMAL HIGH (ref 70–99)
Potassium: 4.3 mmol/L (ref 3.5–5.1)
Sodium: 136 mmol/L (ref 135–145)

## 2020-05-20 LAB — CBC
HCT: 25.1 % — ABNORMAL LOW (ref 36.0–46.0)
Hemoglobin: 8.1 g/dL — ABNORMAL LOW (ref 12.0–15.0)
MCH: 31.4 pg (ref 26.0–34.0)
MCHC: 32.3 g/dL (ref 30.0–36.0)
MCV: 97.3 fL (ref 80.0–100.0)
Platelets: 165 10*3/uL (ref 150–400)
RBC: 2.58 MIL/uL — ABNORMAL LOW (ref 3.87–5.11)
RDW: 15.4 % (ref 11.5–15.5)
WBC: 8.9 10*3/uL (ref 4.0–10.5)
nRBC: 0 % (ref 0.0–0.2)

## 2020-05-20 LAB — GLUCOSE, CAPILLARY
Glucose-Capillary: 142 mg/dL — ABNORMAL HIGH (ref 70–99)
Glucose-Capillary: 174 mg/dL — ABNORMAL HIGH (ref 70–99)
Glucose-Capillary: 202 mg/dL — ABNORMAL HIGH (ref 70–99)
Glucose-Capillary: 192 mg/dL — ABNORMAL HIGH (ref 70–99)

## 2020-05-20 MED ORDER — PREDNISONE 20 MG PO TABS
30.0000 mg | ORAL_TABLET | Freq: Every day | ORAL | Status: DC
  Administered 2020-05-21: 30 mg via ORAL
  Filled 2020-05-20: qty 1

## 2020-05-20 NOTE — Progress Notes (Signed)
Progress Note    Connie [name]  EXH:[id number] DOB: Sep 04, 1934  DOA: 05/14/2020 PCP: [name], MD    Brief Narrative:    Medical records reviewed and are as summarized below:  Connie [name] is an 86 y.o. female with past medical history of systolic and diastolic congestive heart failure (EF 55-60% 2018), hypothyroidism, diabetes mellitus type 2,  GI bleeding (2016 and 07/2018) with chronic iron deficiency anemia, hyperlipidemia, hypertension who presents to [facility] emergency department due to change in mental status.   Found to have AKI and UTI.  Renal function slowly improving.  Back to ALF in 24-48 hours   Assessment/Plan:   Principal Problem:   Acute kidney injury ([place]) Active Problems:   Hypothyroidism   Hyperlipidemia associated with type 2 diabetes mellitus ([place])   Controlled type 2 diabetes mellitus without complication, without long-term current use of insulin ([name])   Metabolic acidosis   Chronic combined systolic and diastolic congestive heart failure ([name])   GERD without esophagitis   Anemia   Complicated UTI (urinary tract infection)   Bradycardia   Acute metabolic encephalopathy   Hyperkalemia   Acute kidney injury due to UTI (e coli bacteria)  substantial acute kidney injury with creatinine of 4, increased from baseline of 1.38   IVF d/c'd as patient close to baseline  Strict input and output monitoring   renal ultrasound: medical renal disease, no blockage  Treating urinary tract infection with intravenous ceftriaxone- changed to PO to finish 5 day course  Resumed low dose lasix and monitor closely-- holding entresto for now    Anemia  Patient is a longstanding known history of chronic iron deficiency anemia and has received intravenous iron infusions in the past  Of note, patient has a history of multiple gastrointestinal bleeds in the past, notably and 2016 and again in 2019.  Last endoscopic work-up in 07/2018  revealed gastritis via the EGD in addition to nonbleeding colonic angiodysplastic lesions via colonoscopy.  Patient was Hemoccult positive in the emergency department.  s/p 1 unit PRBC, most recent stool was brown and not black and hgb stable  GI doc is MAnn/Hung if needed    Acute metabolic encephalopathy- but per son baseline is confused  Thought to be secondary to uremia considering markedly elevated BUN with concurrent complicated UTI  Hydrating with intravenous fluids to manage acute kidney injury while concurrently treating underlying urinary tract infection  Seems to be at baseline    Hyperkalemia  resolved  Finger pain due to gout/inflammatory arthritis  xray negative for fracture  ortho consult appreciated  responding to prednisone taper    Hypothyroidism  Continue home regimen of Synthroid    Chronic combined systolic and diastolic congestive heart failure ([name])  Resume lasix    GERD without esophagitis  protonix    Bradycardia  Substantial sinus bradycardia noted upon arrival, likely secondary to ongoing beta-blocker use  Coreg at a lower dose    Hyperlipidemia associated with type 2 diabetes mellitus ([place])  Continue home regimen statin therapy    Metabolic acidosis  Notable substantial metabolic acidosis based on chemistry  Likely secondary to degree of renal injury  resolved    Family Communication/Anticipated D/C date and plan/Code Status   DVT prophylaxis: Lovenox ordered. Code Status: Full Code.  Disposition Plan: Status is: Inpatient Son updated on phone 8/28 Remains inpatient appropriate because:Inpatient level of care appropriate due to severity of illness   Dispo: The patient is from: alf  Anticipated d/c is to: ALF              Anticipated d/c date is: 24-48 hours              Patient currently is not medically stable to d/c. -- if Cr stable in the AM can be d/c'd with close monitoring of volume  status        Medical Consultants:    ortho     Subjective:   Finger feels better, liked getting up with PT  Objective:    Vitals:   05/19/20 1615 05/19/20 2108 05/20/20 0503 05/20/20 0939  BP: (!) 138/53 (!) 140/42 (!) 149/41 (!) 139/38  Pulse: 88 65 (!) 57 (!) 58  Resp: 18 18 18 20   Temp:  99.4 F (37.4 C) 98.7 F (37.1 C) 98.6 F (37 C)  TempSrc:  Oral Oral Oral  SpO2: 99% 98% 97% 100%  Weight:      Height:        Intake/Output Summary (Last 24 hours) at 05/20/2020 1240 Last data filed at 05/20/2020 0820 Gross per 24 hour  Intake 640 ml  Output 150 ml  Net 490 ml   Filed Weights   05/14/20 1740 05/15/20 1900  Weight: 52.2 kg 56.6 kg    Exam:   General: Appearance:     Overweight female in no acute distress     Lungs:     Clear to auscultation bilaterally, respirations unlabored  Heart:    Bradycardic. Normal rhythm. No murmurs, rubs, or gallops.   MS:   All extremities are intact.   Neurologic:   Awake, alert, pleasant and cooperative    Data Reviewed:   I have personally reviewed following labs and imaging studies:  Labs: Labs show the following:   Basic Metabolic Panel: Recent Labs  Lab 05/15/20 0337 05/15/20 0956 05/16/20 0314 05/16/20 0314 05/17/20 0405 05/17/20 0405 05/18/20 0136 05/18/20 0136 05/19/20 0817 05/20/20 0512  NA 136   < > 139  --  138  --  140  --  138 136  K 4.9   < > 4.2   < > 3.8   < > 3.9   < > 4.0 4.3  CL 115*   < > 116*  --  111  --  108  --  107 105  CO2 10*   < > 14*  --  18*  --  21*  --  19* 18*  GLUCOSE 105*   < > 132*  --  80  --  95  --  134* 157*  BUN 96*   < > 84*  --  70*  --  54*  --  42* 45*  CREATININE 3.24*   < > 2.77*  --  2.27*  --  1.89*  --  1.81* 1.76*  CALCIUM 7.3*   < > 7.5*  --  7.0*  --  7.2*  --  7.1* 6.9*  MG 1.8  --   --   --   --   --   --   --   --   --    < > = values in this interval not displayed.   GFR Estimated Creatinine Clearance: 18.3 mL/min (A) (by C-G formula  based on SCr of 1.76 mg/dL (H)). Liver Function Tests: Recent Labs  Lab 05/14/20 1830 05/15/20 0337  AST 14* 17  ALT 8 7  ALKPHOS 32* 32*  BILITOT 0.7 0.7  PROT 5.7* 5.1*  ALBUMIN 3.6  3.1*   Recent Labs  Lab 05/14/20 2322  LIPASE 29   No results for input(s): AMMONIA in the last 168 hours. Coagulation profile Recent Labs  Lab 05/14/20 2322  INR 1.4*    CBC: Recent Labs  Lab 05/14/20 1830 05/14/20 2322 05/16/20 0314 05/16/20 0314 05/17/20 0405 05/18/20 0136 05/18/20 2013 05/19/20 0817 05/20/20 0512  WBC 5.7   < > 5.4  --  7.0 8.4  --  10.0 8.9  NEUTROABS 4.4  --   --   --   --   --   --   --   --   HGB 7.8*   < > 7.4*   < > 7.5* 7.3* 8.3* 8.0* 8.1*  HCT 25.8*   < > 24.1*   < > 23.8* 23.3* 26.6* 25.4* 25.1*  MCV 104.9*   < > 101.3*  --  99.2 100.0  --  96.9 97.3  PLT 176   < > 168  --  176 161  --  149* 165   < > = values in this interval not displayed.   Cardiac Enzymes: No results for input(s): CKTOTAL, CKMB, CKMBINDEX, TROPONINI in the last 168 hours. BNP (last 3 results) No results for input(s): PROBNP in the last 8760 hours. CBG: Recent Labs  Lab 05/19/20 1103 05/19/20 1617 05/19/20 2107 05/20/20 0643 05/20/20 1115  GLUCAP 128* 149* 135* 142* 174*   D-Dimer: No results for input(s): DDIMER in the last 72 hours. Hgb A1c: No results for input(s): HGBA1C in the last 72 hours. Lipid Profile: No results for input(s): CHOL, HDL, LDLCALC, TRIG, CHOLHDL, LDLDIRECT in the last 72 hours. Thyroid function studies: No results for input(s): TSH, T4TOTAL, T3FREE, THYROIDAB in the last 72 hours.  Invalid input(s): FREET3 Anemia work up: No results for input(s): VITAMINB12, FOLATE, FERRITIN, TIBC, IRON, RETICCTPCT in the last 72 hours. Sepsis Labs: Recent Labs  Lab 05/14/20 2322 05/15/20 0337 05/17/20 0405 05/18/20 0136 05/19/20 0817 05/20/20 0512  WBC 5.8   < > 7.0 8.4 10.0 8.9  LATICACIDVEN 0.5  --   --   --   --   --    < > = values in this  interval not displayed.    Microbiology Recent Results (from the past 240 hour(s))  SARS Coronavirus 2 by RT PCR (hospital order, performed in [facility] hospital lab) Nasopharyngeal Nasopharyngeal Swab     Status: None   Collection Time: 05/14/20  6:29 PM   Specimen: Nasopharyngeal Swab  Result Value Ref Range Status   SARS Coronavirus 2 NEGATIVE NEGATIVE Final    Comment: (NOTE) SARS-CoV-2 target nucleic acids are NOT DETECTED.  The SARS-CoV-2 RNA is generally detectable in upper and lower respiratory specimens during the acute phase of infection. The lowest concentration of SARS-CoV-2 viral copies this assay can detect is 250 copies / mL. A negative result does not preclude SARS-CoV-2 infection and should not be used as the sole basis for treatment or other patient management decisions.  A negative result may occur with improper specimen collection / handling, submission of specimen other than nasopharyngeal swab, presence of viral mutation(s) within the areas targeted by this assay, and inadequate number of viral copies (<250 copies / mL). A negative result must be combined with clinical observations, patient history, and epidemiological information.  Fact Sheet for Patients:   StrictlyIdeas.no  Fact Sheet for Healthcare Providers: BankingDealers.co.za  This test is not yet approved or  cleared by the [place] FDA and has been authorized  for detection and/or diagnosis of SARS-CoV-2 by FDA under an Emergency Use Authorization (EUA).  This EUA will remain in effect (meaning this test can be used) for the duration of the COVID-19 declaration under Section 564(b)(1) of the Act, 21 U.S.C. section 360bbb-3(b)(1), unless the authorization is terminated or revoked sooner.  Performed at [facility], [place] [address] [place], [place] [address]   Urine culture     Status: Abnormal   Collection Time: 05/14/20  6:56 PM    Specimen: Urine, Random  Result Value Ref Range Status   Specimen Description URINE, RANDOM  Final   Special Requests   Final    NONE Performed at [facility], [place] [address], [place], [place] [address]    Culture >=100,000 COLONIES/mL ESCHERICHIA COLI (A)  Final   Report Status 05/16/2020 FINAL  Final   Organism ID, Bacteria ESCHERICHIA COLI (A)  Final      Susceptibility   Escherichia coli - MIC*    AMPICILLIN 8 SENSITIVE Sensitive     CEFAZOLIN <=4 SENSITIVE Sensitive     CEFTRIAXONE <=0.25 SENSITIVE Sensitive     CIPROFLOXACIN <=0.25 SENSITIVE Sensitive     GENTAMICIN <=1 SENSITIVE Sensitive     IMIPENEM <=0.25 SENSITIVE Sensitive     NITROFURANTOIN 32 SENSITIVE Sensitive     TRIMETH/SULFA <=20 SENSITIVE Sensitive     AMPICILLIN/SULBACTAM <=2 SENSITIVE Sensitive     PIP/TAZO <=4 SENSITIVE Sensitive     * >=100,000 COLONIES/mL ESCHERICHIA COLI  Culture, blood (routine x 2)     Status: None   Collection Time: 05/14/20 11:22 PM   Specimen: BLOOD  Result Value Ref Range Status   Specimen Description BLOOD RIGHT ARM  Final   Special Requests   Final    BOTTLES DRAWN AEROBIC AND ANAEROBIC Blood Culture adequate volume   Culture   Final    NO GROWTH 5 DAYS Performed at [facility], [place] [address], [place], [place] [address]    Report Status 05/20/2020 FINAL  Final  Culture, blood (routine x 2)     Status: None   Collection Time: 05/14/20 11:22 PM   Specimen: BLOOD  Result Value Ref Range Status   Specimen Description BLOOD LEFT ARM  Final   Special Requests   Final    BOTTLES DRAWN AEROBIC AND ANAEROBIC Blood Culture adequate volume   Culture   Final    NO GROWTH 5 DAYS Performed at [facility], [place] [address] [place], [place] [address]    Report Status 05/20/2020 FINAL  Final  MRSA PCR Screening     Status: None   Collection Time: 05/15/20  8:24 PM   Specimen: Nasopharyngeal  Result Value Ref Range Status   MRSA by PCR NEGATIVE NEGATIVE Final     Comment:        The GeneXpert MRSA Assay (FDA approved for NASAL specimens only), is one component of a comprehensive MRSA colonization surveillance program. It is not intended to diagnose MRSA infection nor to guide or monitor treatment for MRSA infections. Performed at [facility], [place] [address], [place], [place] [address]     Procedures and diagnostic studies:  No results found.  Medications:   . acidophilus  1 capsule Oral Daily  . atorvastatin  40 mg Oral Daily  . carvedilol  3.125 mg Oral BID WC  . citalopram  10 mg Oral Daily  . donepezil  5 mg Oral QHS  . enoxaparin (LOVENOX) injection  30 mg Subcutaneous Q24H  . furosemide  20 mg Oral Daily  . insulin aspart  0-15 Units Subcutaneous TID AC & HS  . levothyroxine  125 mcg Oral Daily  . pantoprazole  40 mg Oral Daily  . [START ON 05/21/2020] predniSONE  30 mg Oral Q breakfast   Continuous Infusions:    LOS: 6 days   Geradine Girt  [facility]   How to contact the [facility] Attending or Consulting provider [place] or covering provider during after hours [place], for this patient?  1. Check the care team in [facility] and look for a) attending/consulting TR[facility] provider listed and b) the [facility] team listed 2. Log into [email or web address] and use [facility]'s universal password to access. If you do not have the password, please contact the hospital operator. 3. Locate the [facility] provider you are looking for under [facility] and page to a number that you can be directly reached. 4. If you still have difficulty reaching the provider, please page the [facility] (Director on Call) for the Hospitalists listed on amion for assistance.  05/20/2020, 12:40 PM

## 2020-05-20 NOTE — Progress Notes (Signed)
Physical Therapy Treatment Patient Details Name: Connie [name] MRN: [id number] DOB: 07/15/1934 Today's Date: 05/20/2020    History of Present Illness The pt is an 86 yo female presenting from [facility] (ALF) with AMS. Upon work-up, pt found to have AKI, and UA is suggestive of UTI. PMH includes: CHF, hypothyroidism, DM II, GI bleed with chronic anemia, HLD, HTN.    PT Comments    Pt received in recliner, motivated and eager to participate in therapy. She required min guard assist transfers and ambulation 250' with RW. Pt returned to recliner at end of session. Pt very grateful for therapy intervention.    Follow Up Recommendations  Supervision/Assistance - 24 hour;Home health PT (return to [facility])     Equipment Recommendations  None recommended by PT    Recommendations for Other Services       Precautions / Restrictions Precautions Precautions: Fall    Mobility  Bed Mobility               General bed mobility comments: Pt received in recliner.  Transfers Overall transfer level: Needs assistance Equipment used: Rolling walker (2 wheeled) Transfers: Sit to/from Stand Sit to Stand: Min guard         General transfer comment: min guard for safety. Pt demo good technique.  Ambulation/Gait Ambulation/Gait assistance: Min guard Gait Distance (Feet): 250 Feet Assistive device: Rolling walker (2 wheeled) Gait Pattern/deviations: Step-through pattern;Decreased stride length;Trunk flexed Gait velocity: decreased Gait velocity interpretation: <1.31 ft/sec, indicative of household ambulator General Gait Details: steady gait with RW   Stairs             Wheelchair Mobility    Modified Rankin (Stroke Patients Only)       Balance Overall balance assessment: Needs assistance Sitting-balance support: No upper extremity supported;Feet supported Sitting balance-Leahy Scale: Good     Standing balance support: Bilateral upper  extremity supported;During functional activity Standing balance-Leahy Scale: Poor Standing balance comment: reliant on BUE support                            Cognition Arousal/Alertness: Awake/alert Behavior During Therapy: WFL for tasks assessed/performed Overall Cognitive Status: Impaired/Different from baseline Area of Impairment: Memory                     Memory: Decreased short-term memory                Exercises      General Comments        Pertinent Vitals/Pain Pain Assessment: No/denies pain    Home Living                      Prior Function            PT Goals (current goals can now be found in the care plan section) Acute Rehab PT Goals Patient Stated Goal: return to ALF Progress towards PT goals: Progressing toward goals    Frequency    Min 3X/week      PT Plan Current plan remains appropriate    Co-evaluation              AM-PAC PT "6 Clicks" Mobility   Outcome Measure  Help needed turning from your back to your side while in a flat bed without using bedrails?: None Help needed moving from lying on your back to sitting on the side of a flat bed without  using bedrails?: A Little Help needed moving to and from a bed to a chair (including a wheelchair)?: A Little Help needed standing up from a chair using your arms (e.g., wheelchair or bedside chair)?: A Little Help needed to walk in hospital room?: A Little Help needed climbing 3-5 steps with a railing? : A Lot 6 Click Score: 18    End of Session Equipment Utilized During Treatment: Gait belt Activity Tolerance: Patient tolerated treatment well Patient left: in chair;with call bell/phone within reach Nurse Communication: Mobility status PT Visit Diagnosis: Muscle weakness (generalized) (M62.81);Difficulty in walking, not elsewhere classified (R26.2)     Time: 4854-6270 PT Time Calculation (min) (ACUTE ONLY): 18 min  Charges:  $Gait Training: 8-22  mins                     Lorrin [place], PT  Office # [phone] Pager [phone]    [name] 05/20/2020, 9:35 AM

## 2020-05-21 LAB — CBC
HCT: 22.7 % — ABNORMAL LOW (ref 36.0–46.0)
HCT: 23.9 % — ABNORMAL LOW (ref 36.0–46.0)
Hemoglobin: 7.1 g/dL — ABNORMAL LOW (ref 12.0–15.0)
Hemoglobin: 7.4 g/dL — ABNORMAL LOW (ref 12.0–15.0)
MCH: 30.2 pg (ref 26.0–34.0)
MCH: 30 pg (ref 26.0–34.0)
MCHC: 31.3 g/dL (ref 30.0–36.0)
MCHC: 31 g/dL (ref 30.0–36.0)
MCV: 96.6 fL (ref 80.0–100.0)
MCV: 96.8 fL (ref 80.0–100.0)
Platelets: 179 10*3/uL (ref 150–400)
Platelets: 209 10*3/uL (ref 150–400)
RBC: 2.35 MIL/uL — ABNORMAL LOW (ref 3.87–5.11)
RBC: 2.47 MIL/uL — ABNORMAL LOW (ref 3.87–5.11)
RDW: 14.9 % (ref 11.5–15.5)
RDW: 14.8 % (ref 11.5–15.5)
WBC: 10.3 10*3/uL (ref 4.0–10.5)
WBC: 11.3 10*3/uL — ABNORMAL HIGH (ref 4.0–10.5)
nRBC: 0 % (ref 0.0–0.2)
nRBC: 0.2 % (ref 0.0–0.2)

## 2020-05-21 LAB — BASIC METABOLIC PANEL
Anion gap: 12 (ref 5–15)
BUN: 62 mg/dL — ABNORMAL HIGH (ref 8–23)
CO2: 18 mmol/L — ABNORMAL LOW (ref 22–32)
Calcium: 6.9 mg/dL — ABNORMAL LOW (ref 8.9–10.3)
Chloride: 105 mmol/L (ref 98–111)
Creatinine, Ser: 1.77 mg/dL — ABNORMAL HIGH (ref 0.44–1.00)
GFR calc Af Amer: 30 mL/min — ABNORMAL LOW (ref >60)
GFR calc non Af Amer: 26 mL/min — ABNORMAL LOW (ref >60)
Glucose, Bld: 100 mg/dL — ABNORMAL HIGH (ref 70–99)
Potassium: 4.3 mmol/L (ref 3.5–5.1)
Sodium: 135 mmol/L (ref 135–145)

## 2020-05-21 LAB — GLUCOSE, CAPILLARY
Glucose-Capillary: 112 mg/dL — ABNORMAL HIGH (ref 70–99)
Glucose-Capillary: 155 mg/dL — ABNORMAL HIGH (ref 70–99)
Glucose-Capillary: 200 mg/dL — ABNORMAL HIGH (ref 70–99)
Glucose-Capillary: 232 mg/dL — ABNORMAL HIGH (ref 70–99)

## 2020-05-21 MED ORDER — PREDNISONE 20 MG PO TABS: 20.0000 mg | ORAL_TABLET | Freq: Every day | ORAL | Status: DC

## 2020-05-21 MED ORDER — ENOXAPARIN SODIUM 30 MG/0.3ML ~~LOC~~ SOLN: 30.0000 mg | SUBCUTANEOUS | Status: DC

## 2020-05-21 MED ORDER — DARBEPOETIN ALFA 40 MCG/0.4ML IJ SOSY
40.0000 ug | PREFILLED_SYRINGE | INTRAMUSCULAR | Status: DC
  Administered 2020-05-21: 40 ug via SUBCUTANEOUS
  Filled 2020-05-21: qty 0.4

## 2020-05-21 MED ORDER — WHITE PETROLATUM EX OINT
TOPICAL_OINTMENT | CUTANEOUS | Status: AC
  Administered 2020-05-21: 0.2
  Filled 2020-05-21: qty 28.35

## 2020-05-21 NOTE — Progress Notes (Signed)
Pt wanted something for her dry lips, vaseline provided.   Ananiah Maciolek, RN

## 2020-05-21 NOTE — Progress Notes (Signed)
MEDICATION RELATED CONSULT NOTE - FOLLOW UP   Pharmacy Consult for Darbepoetin Indication: Anemia of chronic disease  No Known Allergies  Patient Measurements: Height: 4' 11.02" (149.9 cm) Weight: 56.7 kg (125 lb) IBW/kg (Calculated) : 43.24 Adjusted Body Weight: 56.7  Vital Signs: Temp: 98.3 F (36.8 C) (09/01 0511) BP: 139/47 (09/01 0511) Pulse Rate: 65 (09/01 0511) Intake/Output from previous day: 08/31 0701 - 09/01 0700 In: 360 [P.O.:360] Out: 200 [Urine:200] Intake/Output from this shift: Total I/O In: 580 [P.O.:580] Out: -   Labs: Recent Labs    05/19/20 0817 05/20/20 0512 05/21/20 0101  WBC 10.0 8.9 10.3  HGB 8.0* 8.1* 7.1*  HCT 25.4* 25.1* 22.7*  PLT 149* 165 179  CREATININE 1.81* 1.76* 1.77*   Estimated Creatinine Clearance: 18.2 mL/min (A) (by C-G formula based on SCr of 1.77 mg/dL (H)).   Microbiology: Recent Results (from the past 720 hour(s))  SARS Coronavirus 2 by RT PCR (hospital order, performed in [facility] hospital lab) Nasopharyngeal Nasopharyngeal Swab     Status: None   Collection Time: 05/14/20  6:29 PM   Specimen: Nasopharyngeal Swab  Result Value Ref Range Status   SARS Coronavirus 2 NEGATIVE NEGATIVE Final    Comment: (NOTE) SARS-CoV-2 target nucleic acids are NOT DETECTED.  The SARS-CoV-2 RNA is generally detectable in upper and lower respiratory specimens during the acute phase of infection. The lowest concentration of SARS-CoV-2 viral copies this assay can detect is 250 copies / mL. A negative result does not preclude SARS-CoV-2 infection and should not be used as the sole basis for treatment or other patient management decisions.  A negative result may occur with improper specimen collection / handling, submission of specimen other than nasopharyngeal swab, presence of viral mutation(s) within the areas targeted by this assay, and inadequate number of viral copies (<250 copies / mL). A negative result must be combined with  clinical observations, patient history, and epidemiological information.  Fact Sheet for Patients:   StrictlyIdeas.no  Fact Sheet for Healthcare Providers: BankingDealers.co.za  This test is not yet approved or  cleared by the [place] FDA and has been authorized for detection and/or diagnosis of SARS-CoV-2 by FDA under an Emergency Use Authorization (EUA).  This EUA will remain in effect (meaning this test can be used) for the duration of the COVID-19 declaration under Section 564(b)(1) of the Act, 21 U.S.C. section 360bbb-3(b)(1), unless the authorization is terminated or revoked sooner.  Performed at [facility], [place] [address], [place], [place] [address]   Urine culture     Status: Abnormal   Collection Time: 05/14/20  6:56 PM   Specimen: Urine, Random  Result Value Ref Range Status   Specimen Description URINE, RANDOM  Final   Special Requests   Final    NONE Performed at [facility], [place] [address] [place],  [address]    Culture >=100,000 COLONIES/mL ESCHERICHIA COLI (A)  Final   Report Status 05/16/2020 FINAL  Final   Organism ID, Bacteria ESCHERICHIA COLI (A)  Final      Susceptibility   Escherichia coli - MIC*    AMPICILLIN 8 SENSITIVE Sensitive     CEFAZOLIN <=4 SENSITIVE Sensitive     CEFTRIAXONE <=0.25 SENSITIVE Sensitive     CIPROFLOXACIN <=0.25 SENSITIVE Sensitive     GENTAMICIN <=1 SENSITIVE Sensitive     IMIPENEM <=0.25 SENSITIVE Sensitive     NITROFURANTOIN 32 SENSITIVE Sensitive     TRIMETH/SULFA <=20 SENSITIVE Sensitive  AMPICILLIN/SULBACTAM <=2 SENSITIVE Sensitive     PIP/TAZO <=4 SENSITIVE Sensitive     * >=100,000 COLONIES/mL ESCHERICHIA COLI  Culture, blood (routine x 2)     Status: None   Collection Time: 05/14/20 11:22 PM   Specimen: BLOOD  Result Value Ref Range Status   Specimen Description BLOOD RIGHT ARM  Final   Special Requests   Final    BOTTLES DRAWN AEROBIC  AND ANAEROBIC Blood Culture adequate volume   Culture   Final    NO GROWTH 5 DAYS Performed at [facility], [address] [place], [place] [address]    Report Status 05/20/2020 FINAL  Final  Culture, blood (routine x 2)     Status: None   Collection Time: 05/14/20 11:22 PM   Specimen: BLOOD  Result Value Ref Range Status   Specimen Description BLOOD LEFT ARM  Final   Special Requests   Final    BOTTLES DRAWN AEROBIC AND ANAEROBIC Blood Culture adequate volume   Culture   Final    NO GROWTH 5 DAYS Performed at [facility], [place] [address] [place], [place] [address]    Report Status 05/20/2020 FINAL  Final  MRSA PCR Screening     Status: None   Collection Time: 05/15/20  8:24 PM   Specimen: Nasopharyngeal  Result Value Ref Range Status   MRSA by PCR NEGATIVE NEGATIVE Final    Comment:        The GeneXpert MRSA Assay (FDA approved for NASAL specimens only), is one component of a comprehensive MRSA colonization surveillance program. It is not intended to diagnose MRSA infection nor to guide or monitor treatment for MRSA infections. Performed at [facility], [place] [address], [place],  [address]      Assessment: 86 yo female with anemia of chronic disease now presenting with AKI on CKD. Hgb 7.1 today, last Tsat was 24%. Pharmacy consulted to start Darbepoetin.   Goal of Therapy:  Hgb >11  Plan:  Darbepoetin 40 mcg SQ q7days Monitor hgb  Avyaan Summer A. Levada Dy, PharmD, BCPS, FNKF Clinical Pharmacist [place] Please utilize Amion for appropriate phone number to reach the unit pharmacist ([place])    Theotis Burrow 05/21/2020,8:58 AM

## 2020-05-21 NOTE — Progress Notes (Signed)
Progress Note    [name]  VOZ:[id number] DOB: [birth year]  DOA: 05/14/2020 PCP: [name], MD    Brief Narrative:    Medical records reviewed and are as summarized below:  Connie [name] is an 86 y.o. female with past medical history of systolic and diastolic congestive heart failure (EF 55-60% 2018), hypothyroidism, diabetes mellitus type 2,  GI bleeding (2016 and 07/2018) with chronic iron deficiency anemia, hyperlipidemia, hypertension who presents to [facility] emergency department due to change in mental status.   Found to have AKI and UTI.  Renal function slowly improving.  Now w/ worsening anemia   Assessment/Plan:   Acute kidney injury -Creatinine up from baseline of 1.4 up to 4.3 on admission -Secondary to prerenal azotemia worsened by infection, ongoing diuretic use and Entresto -Renal ultrasound without hydronephrosis -Kidney function improved with hydration, creatinine down to 1.7 -Oral Lasix resumed -Holding Entresto at this time, resume at discharge  Acute on chronic anemia -Longstanding history of known chronic iron deficiency anemia from GI blood loss -Multiple hospitalizations for same, last endoscopic work-up in 07/2018 revealed gastritis and nonbleeding colonic angiodysplastic lesions on colonoscopy. -Transfuse 1 unit of PRBC this admission -Hemoglobin down to 7.1 this morning -Anemia panel last week without overt iron deficiency -Monitor hemoglobin, will dose erythropoietin today    Acute metabolic encephalopathy- but per son baseline is confused -Worsened in the setting of renal failure, UTI -Improving, now back to baseline    Hyperkalemia -resolved    Finger pain due to gout/inflammatory arthritis -xray negative for fracture, ortho consult appreciated -responding to prednisone taper    Hypothyroidism -Continue home regimen of Synthroid    Chronic combined systolic and diastolic congestive heart failure  ([name]) -Last echo with improvement in EF to 42%, grade 1 diastolic dysfunction -Continue Coreg, Lasix resumed, Entresto on hold    Bradycardia -She had sinus bradycardia noted at the time of admission -Coreg resumed at lower dose  Dementia -Continue Aricept   Family Communication/Anticipated D/C date and plan/Code Status   DVT prophylaxis: Hold Lovenox today Code Status: Full Code  Disposition Plan: Status is: Inpatient Son updated on phone 9/1 Remains inpatient appropriate because:Inpatient level of care appropriate due to severity of illness   Dispo: The patient is from: alf              Anticipated d/c is to: ALF              Anticipated d/c date is: 24-48 hours              Patient currently is not medically stable to d/c. -If no active bleeding and hemoglobin stable   Medical Consultants:    ortho     Subjective:   Finger feels better, liked getting up with PT  Objective:    Vitals:   05/20/20 1641 05/20/20 2045 05/21/20 0511 05/21/20 0834  BP: 129/85 129/62 (!) 139/47 (!) 141/37  Pulse: 64 (!) 59 65 69  Resp: 18 18 18 19   Temp: 98.6 F (37 C) 98.6 F (37 C) 98.3 F (36.8 C) 98.6 F (37 C)  TempSrc: Oral Oral  Oral  SpO2: 100% 100% 98% 100%  Weight:   56.7 kg   Height:        Intake/Output Summary (Last 24 hours) at 05/21/2020 1324 Last data filed at 05/21/2020 1259 Gross per 24 hour  Intake 820 ml  Output 200 ml  Net 620 ml   [employer]  05/14/20 1740 05/15/20 1900 05/21/20 0511  Weight: 52.2 kg 56.6 kg 56.7 kg    Exam:  Gen: Awake, Alert, Oriented X 3, no distress HEENT: no JVD Lungs: CTAB CVS: S1S2/RRR Abd: soft, Non tender, non distended, BS present Extremities: No edema, hyperpigmentation on lower legs Skin: no new rashes   Data Reviewed:   I have personally reviewed following labs and imaging studies:  Labs: Labs show the following:   Basic Metabolic Panel: Recent Labs  Lab 05/15/20 0337 05/15/20 0956  05/17/20 0405 05/17/20 0405 05/18/20 0136 05/18/20 0136 05/19/20 0817 05/19/20 0817 05/20/20 0512 05/21/20 0101  NA 136   < > 138  --  140  --  138  --  136 135  K 4.9   < > 3.8   < > 3.9   < > 4.0   < > 4.3 4.3  CL 115*   < > 111  --  108  --  107  --  105 105  CO2 10*   < > 18*  --  21*  --  19*  --  18* 18*  GLUCOSE 105*   < > 80  --  95  --  134*  --  157* 100*  BUN 96*   < > 70*  --  54*  --  42*  --  45* 62*  CREATININE 3.24*   < > 2.27*  --  1.89*  --  1.81*  --  1.76* 1.77*  CALCIUM 7.3*   < > 7.0*  --  7.2*  --  7.1*  --  6.9* 6.9*  MG 1.8  --   --   --   --   --   --   --   --   --    < > = values in this interval not displayed.   GFR Estimated Creatinine Clearance: 18.2 mL/min (A) (by C-G formula based on SCr of 1.77 mg/dL (H)). Liver Function Tests: Recent Labs  Lab 05/14/20 1830 05/15/20 0337  AST 14* 17  ALT 8 7  ALKPHOS 32* 32*  BILITOT 0.7 0.7  PROT 5.7* 5.1*  ALBUMIN 3.6 3.1*   Recent Labs  Lab 05/14/20 2322  LIPASE 29   No results for input(s): AMMONIA in the last 168 hours. Coagulation profile Recent Labs  Lab 05/14/20 2322  INR 1.4*    CBC: Recent Labs  Lab 05/14/20 1830 05/14/20 2322 05/18/20 0136 05/18/20 0136 05/18/20 2013 05/19/20 0817 05/20/20 0512 05/21/20 0101 05/21/20 1241  WBC 5.7   < > 8.4  --   --  10.0 8.9 10.3 11.3*  NEUTROABS 4.4  --   --   --   --   --   --   --   --   HGB 7.8*   < > 7.3*   < > 8.3* 8.0* 8.1* 7.1* 7.4*  HCT 25.8*   < > 23.3*   < > 26.6* 25.4* 25.1* 22.7* 23.9*  MCV 104.9*   < > 100.0  --   --  96.9 97.3 96.6 96.8  PLT 176   < > 161  --   --  149* 165 179 209   < > = values in this interval not displayed.   Cardiac Enzymes: No results for input(s): CKTOTAL, CKMB, CKMBINDEX, TROPONINI in the last 168 hours. BNP (last 3 results) No results for input(s): PROBNP in the last 8760 hours. CBG: Recent Labs  Lab 05/20/20 1115 05/20/20 1639 05/20/20 2044 05/21/20 7893  05/21/20 1111  GLUCAP 174*  202* 192* 112* 155*   D-Dimer: No results for input(s): DDIMER in the last 72 hours. Hgb A1c: No results for input(s): HGBA1C in the last 72 hours. Lipid Profile: No results for input(s): CHOL, HDL, LDLCALC, TRIG, CHOLHDL, LDLDIRECT in the last 72 hours. Thyroid function studies: No results for input(s): TSH, T4TOTAL, T3FREE, THYROIDAB in the last 72 hours.  Invalid input(s): FREET3 Anemia work up: No results for input(s): VITAMINB12, FOLATE, FERRITIN, TIBC, IRON, RETICCTPCT in the last 72 hours. Sepsis Labs: Recent Labs  Lab 05/14/20 2322 05/15/20 0337 05/19/20 0817 05/20/20 0512 05/21/20 0101 05/21/20 1241  WBC 5.8   < > 10.0 8.9 10.3 11.3*  LATICACIDVEN 0.5  --   --   --   --   --    < > = values in this interval not displayed.    Microbiology Recent Results (from the past 240 hour(s))  SARS Coronavirus 2 by RT PCR (hospital order, performed in [facility] hospital lab) Nasopharyngeal Nasopharyngeal Swab     Status: None   Collection Time: 05/14/20  6:29 PM   Specimen: Nasopharyngeal Swab  Result Value Ref Range Status   SARS Coronavirus 2 NEGATIVE NEGATIVE Final    Comment: (NOTE) SARS-CoV-2 target nucleic acids are NOT DETECTED.  The SARS-CoV-2 RNA is generally detectable in upper and lower respiratory specimens during the acute phase of infection. The lowest concentration of SARS-CoV-2 viral copies this assay can detect is 250 copies / mL. A negative result does not preclude SARS-CoV-2 infection and should not be used as the sole basis for treatment or other patient management decisions.  A negative result may occur with improper specimen collection / handling, submission of specimen other than nasopharyngeal swab, presence of viral mutation(s) within the areas targeted by this assay, and inadequate number of viral copies (<250 copies / mL). A negative result must be combined with clinical observations, patient history, and epidemiological information.  Fact  Sheet for Patients:   StrictlyIdeas.no  Fact Sheet for Healthcare Providers: BankingDealers.co.za  This test is not yet approved or  cleared by the [place] FDA and has been authorized for detection and/or diagnosis of SARS-CoV-2 by FDA under an Emergency Use Authorization (EUA).  This EUA will remain in effect (meaning this test can be used) for the duration of the COVID-19 declaration under Section 564(b)(1) of the Act, 21 U.S.C. section 360bbb-3(b)(1), unless the authorization is terminated or revoked sooner.  Performed at [facility], [place] [address], [place], [place] [address]   Urine culture     Status: Abnormal   Collection Time: 05/14/20  6:56 PM   Specimen: Urine, Random  Result Value Ref Range Status   Specimen Description URINE, RANDOM  Final   Special Requests   Final    NONE Performed at [facility], [place] [address], [place], [place] [address]    Culture >=100,000 COLONIES/mL ESCHERICHIA COLI (A)  Final   Report Status 05/16/2020 FINAL  Final   Organism ID, Bacteria ESCHERICHIA COLI (A)  Final      Susceptibility   Escherichia coli - MIC*    AMPICILLIN 8 SENSITIVE Sensitive     CEFAZOLIN <=4 SENSITIVE Sensitive     CEFTRIAXONE <=0.25 SENSITIVE Sensitive     CIPROFLOXACIN <=0.25 SENSITIVE Sensitive     GENTAMICIN <=1 SENSITIVE Sensitive     IMIPENEM <=0.25 SENSITIVE Sensitive     NITROFURANTOIN 32 SENSITIVE Sensitive     TRIMETH/SULFA <=20 SENSITIVE  Sensitive     AMPICILLIN/SULBACTAM <=2 SENSITIVE Sensitive     PIP/TAZO <=4 SENSITIVE Sensitive     * >=100,000 COLONIES/mL ESCHERICHIA COLI  Culture, blood (routine x 2)     Status: None   Collection Time: 05/14/20 11:22 PM   Specimen: BLOOD  Result Value Ref Range Status   Specimen Description BLOOD RIGHT ARM  Final   Special Requests   Final    BOTTLES DRAWN AEROBIC AND ANAEROBIC Blood Culture adequate volume   Culture   Final    NO GROWTH 5  DAYS Performed at [facility], [address], [place], [place] [address]    Report Status 05/20/2020 FINAL  Final  Culture, blood (routine x 2)     Status: None   Collection Time: 05/14/20 11:22 PM   Specimen: BLOOD  Result Value Ref Range Status   Specimen Description BLOOD LEFT ARM  Final   Special Requests   Final    BOTTLES DRAWN AEROBIC AND ANAEROBIC Blood Culture adequate volume   Culture   Final    NO GROWTH 5 DAYS Performed at [facility], [place] [address], [place], [place] [address]    Report Status 05/20/2020 FINAL  Final  MRSA PCR Screening     Status: None   Collection Time: 05/15/20  8:24 PM   Specimen: Nasopharyngeal  Result Value Ref Range Status   MRSA by PCR NEGATIVE NEGATIVE Final    Comment:        The GeneXpert MRSA Assay (FDA approved for NASAL specimens only), is one component of a comprehensive MRSA colonization surveillance program. It is not intended to diagnose MRSA infection nor to guide or monitor treatment for MRSA infections. Performed at [facility], [place] [address], [place], [place] [address]     Procedures and diagnostic studies:  No results found.  Medications:   . acidophilus  1 capsule Oral Daily  . atorvastatin  40 mg Oral Daily  . carvedilol  3.125 mg Oral BID WC  . citalopram  10 mg Oral Daily  . darbepoetin (ARANESP) injection - NON-DIALYSIS  40 mcg Subcutaneous Q Wed-1800  . donepezil  5 mg Oral QHS  . [START ON 05/22/2020] enoxaparin (LOVENOX) injection  30 mg Subcutaneous Q24H  . furosemide  20 mg Oral Daily  . insulin aspart  0-15 Units Subcutaneous TID AC & HS  . levothyroxine  125 mcg Oral Daily  . pantoprazole  40 mg Oral Daily  . [START ON 05/22/2020] predniSONE  20 mg Oral Q breakfast   Continuous Infusions:    LOS: 7 days   [facility]

## 2020-05-22 LAB — CBC
HCT: 21 % — ABNORMAL LOW (ref 36.0–46.0)
Hemoglobin: 6.5 g/dL — CL (ref 12.0–15.0)
MCH: 30.4 pg (ref 26.0–34.0)
MCHC: 31 g/dL (ref 30.0–36.0)
MCV: 98.1 fL (ref 80.0–100.0)
Platelets: 235 10*3/uL (ref 150–400)
RBC: 2.14 MIL/uL — ABNORMAL LOW (ref 3.87–5.11)
RDW: 14.8 % (ref 11.5–15.5)
WBC: 9.2 10*3/uL (ref 4.0–10.5)
nRBC: 0 % (ref 0.0–0.2)

## 2020-05-22 LAB — BASIC METABOLIC PANEL
Anion gap: 9 (ref 5–15)
BUN: 79 mg/dL — ABNORMAL HIGH (ref 8–23)
CO2: 21 mmol/L — ABNORMAL LOW (ref 22–32)
Calcium: 6.9 mg/dL — ABNORMAL LOW (ref 8.9–10.3)
Chloride: 107 mmol/L (ref 98–111)
Creatinine, Ser: 1.91 mg/dL — ABNORMAL HIGH (ref 0.44–1.00)
GFR calc Af Amer: 27 mL/min — ABNORMAL LOW (ref >60)
GFR calc non Af Amer: 24 mL/min — ABNORMAL LOW (ref >60)
Glucose, Bld: 122 mg/dL — ABNORMAL HIGH (ref 70–99)
Potassium: 4.1 mmol/L (ref 3.5–5.1)
Sodium: 137 mmol/L (ref 135–145)

## 2020-05-22 LAB — PREPARE RBC (CROSSMATCH)

## 2020-05-22 LAB — GLUCOSE, CAPILLARY
Glucose-Capillary: 107 mg/dL — ABNORMAL HIGH (ref 70–99)
Glucose-Capillary: 118 mg/dL — ABNORMAL HIGH (ref 70–99)
Glucose-Capillary: 155 mg/dL — ABNORMAL HIGH (ref 70–99)
Glucose-Capillary: 189 mg/dL — ABNORMAL HIGH (ref 70–99)

## 2020-05-22 MED ORDER — FUROSEMIDE 10 MG/ML IJ SOLN
20.0000 mg | Freq: Once | INTRAMUSCULAR | Status: AC
  Administered 2020-05-22: 20 mg via INTRAVENOUS
  Filled 2020-05-22: qty 2

## 2020-05-22 MED ORDER — SODIUM CHLORIDE 0.9 % IV SOLN: INTRAVENOUS | Status: DC

## 2020-05-22 MED ORDER — SODIUM CHLORIDE 0.9% IV SOLUTION: Freq: Once | INTRAVENOUS | Status: DC

## 2020-05-22 MED ORDER — PREDNISONE 10 MG PO TABS
10.0000 mg | ORAL_TABLET | Freq: Every day | ORAL | Status: AC
  Administered 2020-05-22: 10 mg via ORAL
  Filled 2020-05-22: qty 1

## 2020-05-22 MED ORDER — PANTOPRAZOLE SODIUM 40 MG PO TBEC
40.0000 mg | DELAYED_RELEASE_TABLET | Freq: Two times a day (BID) | ORAL | Status: DC
  Administered 2020-05-22 – 2020-05-25 (×6): 40 mg via ORAL
  Filled 2020-05-22 (×6): qty 1

## 2020-05-22 NOTE — Progress Notes (Signed)
Progress Note    Connie [name]  WNU:[id number] DOB: Jun 12, 1934  DOA: 05/14/2020 PCP: [name], MD    Brief Narrative:    Medical records reviewed and are as summarized below:  Connie [name] is an 86 y.o. female with past medical history of systolic and diastolic congestive heart failure (EF 55-60% 2018), hypothyroidism, diabetes mellitus type 2,  GI bleeding (2016 and 07/2018) with chronic iron deficiency anemia, hyperlipidemia, hypertension who presents to [facility] emergency department due to change in mental status.   Found to have AKI and UTI.  Renal function slowly improving.  Now w/ worsening anemia   Assessment/Plan:   Acute kidney injury -Creatinine up from baseline of 1.4 up to 4.3 on admission -Secondary to prerenal azotemia worsened by infection, ongoing diuretic use and Entresto -Renal ultrasound without hydronephrosis -Kidney function improved with hydration, creatinine down to 1.7 -Oral Lasix resumed -Holding Entresto at this time, resume at discharge  Acute on chronic anemia -history of known chronic iron deficiency anemia from GI blood loss -Multiple hospitalizations for same, last endoscopic work-up in 07/2018 revealed gastritis and nonbleeding colonic angiodysplastic lesions on colonoscopy. -Transfused 1 unit of PRBC this admission -Hemoglobin down to 6.5 this morning, transfuse 2 units of blood today -Anemia panel last week without overt iron deficiency -will ask her Gi Dr.Hung to evaluate    Acute metabolic encephalopathy- but per son baseline is confused -Worsened in the setting of renal failure, UTI -Improving, now back to baseline    Hyperkalemia -resolved    Finger pain due to gout/inflammatory arthritis -xray negative for fracture, ortho consult appreciated -improved, taper off prednisone     Hypothyroidism -Continue home regimen of Synthroid    Chronic combined systolic and diastolic congestive heart failure  ([name]) -Last echo with improvement in EF to 03%, grade 1 diastolic dysfunction -Continue Coreg, Lasix resumed, Entresto on hold    Bradycardia -She had sinus bradycardia noted at the time of admission -Coreg resumed at lower dose  Dementia -Continue Aricept   Family Communication/Anticipated D/C date and plan/Code Status   DVT prophylaxis: Hold Lovenox for GI  Code Status: Full Code  Disposition Plan: Status is: Inpatient Son updated on phone 9/1 Remains inpatient appropriate because:Inpatient level of care appropriate due to severity of illness   Dispo: The patient is from: alf              Anticipated d/c is to: ALF              Anticipated d/c date is: 2days if no further bleeding              Patient currently is not medically stable to d/c.  Medical Consultants:    ortho     Subjective:  Feels okay, a bit tired, his nurse reports black stools  Objective:    Vitals:   05/21/20 1625 05/21/20 2119 05/22/20 0523 05/22/20 1058  BP: (!) 145/50 (!) 153/45 (!) 145/50 (!) 145/47  Pulse: 69 (!) 55 62 (!) 57  Resp: 19 15 16 16   Temp: 98.8 F (37.1 C) 98.2 F (36.8 C) 98.5 F (36.9 C) 98.5 F (36.9 C)  TempSrc: Oral   Oral  SpO2: 100% 98% 97% 98%  Weight:      Height:        Intake/Output Summary (Last 24 hours) at 05/22/2020 1139 Last data filed at 05/22/2020 0600 Gross per 24 hour  Intake 600 ml  Output 500 ml  Net  100 ml   [employer]   05/14/20 1740 05/15/20 1900 05/21/20 0511  Weight: 52.2 kg 56.6 kg 56.7 kg    Exam:  Gen: Elderly pleasant female sitting up in bed awake alert oriented x2, no distress, cognitive deficits HEENT: No JVD CVS: S1-S2, regular rate rhythm Lungs: Clear bilaterally Abdomen: Soft, nontender, bowel sounds present Extremities: No edema, hyperpigmentation on lower legs Skin: no new rashes   Data Reviewed:   I have personally reviewed following labs and imaging studies:  Labs: Labs show the following:   Basic  Metabolic Panel: Recent Labs  Lab 05/18/20 0136 05/18/20 0136 05/19/20 0817 05/19/20 0817 05/20/20 0512 05/20/20 0512 05/21/20 0101 05/22/20 0607  NA 140  --  138  --  136  --  135 137  K 3.9   < > 4.0   < > 4.3   < > 4.3 4.1  CL 108  --  107  --  105  --  105 107  CO2 21*  --  19*  --  18*  --  18* 21*  GLUCOSE 95  --  134*  --  157*  --  100* 122*  BUN 54*  --  42*  --  45*  --  62* 79*  CREATININE 1.89*  --  1.81*  --  1.76*  --  1.77* 1.91*  CALCIUM 7.2*  --  7.1*  --  6.9*  --  6.9* 6.9*   < > = values in this interval not displayed.   GFR Estimated Creatinine Clearance: 16.8 mL/min (A) (by C-G formula based on SCr of 1.91 mg/dL (H)). Liver Function Tests: No results for input(s): AST, ALT, ALKPHOS, BILITOT, PROT, ALBUMIN in the last 168 hours. No results for input(s): LIPASE, AMYLASE in the last 168 hours. No results for input(s): AMMONIA in the last 168 hours. Coagulation profile No results for input(s): INR, PROTIME in the last 168 hours.  CBC: Recent Labs  Lab 05/19/20 0817 05/20/20 0512 05/21/20 0101 05/21/20 1241 05/22/20 0607  WBC 10.0 8.9 10.3 11.3* 9.2  HGB 8.0* 8.1* 7.1* 7.4* 6.5*  HCT 25.4* 25.1* 22.7* 23.9* 21.0*  MCV 96.9 97.3 96.6 96.8 98.1  PLT 149* 165 179 209 235   Cardiac Enzymes: No results for input(s): CKTOTAL, CKMB, CKMBINDEX, TROPONINI in the last 168 hours. BNP (last 3 results) No results for input(s): PROBNP in the last 8760 hours. CBG: Recent Labs  Lab 05/21/20 1111 05/21/20 1627 05/21/20 2118 05/22/20 0657 05/22/20 1127  GLUCAP 155* 200* 232* 107* 118*   D-Dimer: No results for input(s): DDIMER in the last 72 hours. Hgb A1c: No results for input(s): HGBA1C in the last 72 hours. Lipid Profile: No results for input(s): CHOL, HDL, LDLCALC, TRIG, CHOLHDL, LDLDIRECT in the last 72 hours. Thyroid function studies: No results for input(s): TSH, T4TOTAL, T3FREE, THYROIDAB in the last 72 hours.  Invalid input(s): FREET3 Anemia  work up: No results for input(s): VITAMINB12, FOLATE, FERRITIN, TIBC, IRON, RETICCTPCT in the last 72 hours. Sepsis Labs: Recent Labs  Lab 05/20/20 0512 05/21/20 0101 05/21/20 1241 05/22/20 0607  WBC 8.9 10.3 11.3* 9.2    Microbiology Recent Results (from the past 240 hour(s))  SARS Coronavirus 2 by RT PCR (hospital order, performed in [facility] hospital lab) Nasopharyngeal Nasopharyngeal Swab     Status: None   Collection Time: 05/14/20  6:29 PM   Specimen: Nasopharyngeal Swab  Result Value Ref Range Status   SARS Coronavirus 2 NEGATIVE NEGATIVE Final  Comment: (NOTE) SARS-CoV-2 target nucleic acids are NOT DETECTED.  The SARS-CoV-2 RNA is generally detectable in upper and lower respiratory specimens during the acute phase of infection. The lowest concentration of SARS-CoV-2 viral copies this assay can detect is 250 copies / mL. A negative result does not preclude SARS-CoV-2 infection and should not be used as the sole basis for treatment or other patient management decisions.  A negative result may occur with improper specimen collection / handling, submission of specimen other than nasopharyngeal swab, presence of viral mutation(s) within the areas targeted by this assay, and inadequate number of viral copies (<250 copies / mL). A negative result must be combined with clinical observations, patient history, and epidemiological information.  Fact Sheet for Patients:   StrictlyIdeas.no  Fact Sheet for Healthcare Providers: BankingDealers.co.za  This test is not yet approved or  cleared by the [place] FDA and has been authorized for detection and/or diagnosis of SARS-CoV-2 by FDA under an Emergency Use Authorization (EUA).  This EUA will remain in effect (meaning this test can be used) for the duration of the COVID-19 declaration under Section 564(b)(1) of the Act, 21 U.S.C. section 360bbb-3(b)(1), unless the  authorization is terminated or revoked sooner.  Performed at [facility], [place] [address], [place], [place] [address]   Urine culture     Status: Abnormal   Collection Time: 05/14/20  6:56 PM   Specimen: Urine, Random  Result Value Ref Range Status   Specimen Description URINE, RANDOM  Final   Special Requests   Final    NONE Performed at [facility], [place] [address], [place], [place] [address]    Culture >=100,000 COLONIES/mL ESCHERICHIA COLI (A)  Final   Report Status 05/16/2020 FINAL  Final   Organism ID, Bacteria ESCHERICHIA COLI (A)  Final      Susceptibility   Escherichia coli - MIC*    AMPICILLIN 8 SENSITIVE Sensitive     CEFAZOLIN <=4 SENSITIVE Sensitive     CEFTRIAXONE <=0.25 SENSITIVE Sensitive     CIPROFLOXACIN <=0.25 SENSITIVE Sensitive     GENTAMICIN <=1 SENSITIVE Sensitive     IMIPENEM <=0.25 SENSITIVE Sensitive     NITROFURANTOIN 32 SENSITIVE Sensitive     TRIMETH/SULFA <=20 SENSITIVE Sensitive     AMPICILLIN/SULBACTAM <=2 SENSITIVE Sensitive     PIP/TAZO <=4 SENSITIVE Sensitive     * >=100,000 COLONIES/mL ESCHERICHIA COLI  Culture, blood (routine x 2)     Status: None   Collection Time: 05/14/20 11:22 PM   Specimen: BLOOD  Result Value Ref Range Status   Specimen Description BLOOD RIGHT ARM  Final   Special Requests   Final    BOTTLES DRAWN AEROBIC AND ANAEROBIC Blood Culture adequate volume   Culture   Final    NO GROWTH 5 DAYS Performed at [facility], [place] [address], [place], [place] [address]    Report Status 05/20/2020 FINAL  Final  Culture, blood (routine x 2)     Status: None   Collection Time: 05/14/20 11:22 PM   Specimen: BLOOD  Result Value Ref Range Status   Specimen Description BLOOD LEFT ARM  Final   Special Requests   Final    BOTTLES DRAWN AEROBIC AND ANAEROBIC Blood Culture adequate volume   Culture   Final    NO GROWTH 5 DAYS Performed at [facility], [place] [address], [place],  [address]    Report  Status 05/20/2020 FINAL  Final  MRSA  PCR Screening     Status: None   Collection Time: 05/15/20  8:24 PM   Specimen: Nasopharyngeal  Result Value Ref Range Status   MRSA by PCR NEGATIVE NEGATIVE Final    Comment:        The GeneXpert MRSA Assay (FDA approved for NASAL specimens only), is one component of a comprehensive MRSA colonization surveillance program. It is not intended to diagnose MRSA infection nor to guide or monitor treatment for MRSA infections. Performed at [facility], [place] [address], [place], [place] [address]     Procedures and diagnostic studies:  No results found.  Medications:    sodium chloride   Intravenous Once   acidophilus  1 capsule Oral Daily   atorvastatin  40 mg Oral Daily   carvedilol  3.125 mg Oral BID WC   citalopram  10 mg Oral Daily   darbepoetin (ARANESP) injection - NON-DIALYSIS  40 mcg Subcutaneous Q Wed-1800   donepezil  5 mg Oral QHS   furosemide  20 mg Intravenous Once   furosemide  20 mg Oral Daily   insulin aspart  0-15 Units Subcutaneous TID AC & HS   levothyroxine  125 mcg Oral Daily   pantoprazole  40 mg Oral BID   Continuous Infusions:    LOS: 8 days   [facility]

## 2020-05-22 NOTE — Consult Note (Signed)
Reason for Consult: Anemia and heme positive stool Referring Physician: [facility]  Lovena Le Mabe HPI: This is an 86 year old female with multiple medical problems admitted for UTI, acute metabolic encephalopathy, AKI, and anemia.  Over the course of her hospitalization she improved clinically with her metabolic issues, but she was noted to have progressive drop in her HGB.  Her HGB today is at 6.5 g/dL and she is heme positive.  Her last endoscopic evaluation for recurrent IDA was 07/24/2018.  At that time the source of the bleeding was felt to be from the upper GI tract as hematin was noted in the gastric lumen.  Nonbleeding AVMs were found in the colon and they were ablated with APC.  Past Medical History:  Diagnosis Date  . Allergy    RHINITIS  . Angiodysplasia of duodenum    hx/notes 11/21/2015  . Angiodysplasia of stomach    hx/notes 11/21/2015  . Arthritis    "joints ache"  . Bleeding gastric ulcer    hx/notes 11/21/2015  . Chronic blood loss anemia    secondary to angiodysplasia of the stomach and duodenum as well as history of bleeding gastric ulcer hx/notes 11/21/2015  . Chronic systolic CHF (congestive heart failure) ([place]) 12/23/2015  . History of blood transfusion 01/2015; 06/2015; 11/21/2015  . Hypercholesteremia   . Hypertension   . Hypothyroidism   . Obesity   . Pneumonia "several times"  . Type II diabetes mellitus ([place])     Past Surgical History:  Procedure Laterality Date  . BIOPSY  07/24/2018   Procedure: BIOPSY;  Surgeon: Carol Ada, MD;  Location: [facility];  Service: Endoscopy;;  . CARDIAC CATHETERIZATION N/A 12/19/2015   Procedure: Left Heart Cath and Coronary Angiography;  Surgeon: Leonie Man, MD;  Location: [facility];  Service: Cardiovascular;  Laterality: N/A;  . COLONOSCOPY N/A 08/30/2014   Procedure: COLONOSCOPY;  Surgeon: Beryle Beams, MD;  [facility];  Service: Endoscopy;  Laterality: N/A;  . COLONOSCOPY N/A 02/21/2015    Procedure: COLONOSCOPY;  Surgeon: Carol Ada, MD;  Location: [facility] ENDOSCOPY;  Service: Endoscopy;  Laterality: N/A;  . COLONOSCOPY WITH PROPOFOL N/A 07/24/2018   Procedure: COLONOSCOPY WITH PROPOFOL;  Surgeon: Carol Ada, MD;  [facility];  Service: Endoscopy;  Laterality: N/A;  . ENTEROSCOPY N/A 06/06/2015   Procedure: ENTEROSCOPY;  Surgeon: Carol Ada, MD;  [facility];  Service: Endoscopy;  Laterality: N/A;  . ENTEROSCOPY N/A 07/24/2018   Procedure: ENTEROSCOPY;  Surgeon: Carol Ada, MD;  [facility];  Service: Endoscopy;  Laterality: N/A;  . FRACTURE SURGERY    . GIVENS CAPSULE STUDY N/A 05/15/2015   Procedure: GIVENS CAPSULE STUDY;  Surgeon: Carol Ada, MD;  Location: [facility] ENDOSCOPY;  Service: Endoscopy;  Laterality: N/A;  . HOT HEMOSTASIS N/A 08/30/2014   Procedure: HOT HEMOSTASIS (ARGON PLASMA COAGULATION/BICAP);  Surgeon: Beryle Beams, MD;  Location: [facility];  Service: Endoscopy;  Laterality: N/A;  . HOT HEMOSTASIS N/A 06/06/2015   Procedure: HOT HEMOSTASIS (ARGON PLASMA COAGULATION/BICAP);  Surgeon: Carol Ada, MD;  Location: [facility];  Service: Endoscopy;  Laterality: N/A;  . HOT HEMOSTASIS N/A 07/24/2018   Procedure: HOT HEMOSTASIS (ARGON PLASMA COAGULATION/BICAP);  Surgeon: Carol Ada, MD;  Location: [facility];  Service: Endoscopy;  Laterality: N/A;  . JOINT REPLACEMENT    . REVISION TOTAL KNEE ARTHROPLASTY Bilateral 2004-2015  . SHOULDER OPEN ROTATOR CUFF REPAIR Right 12/2004   open subacromial decompression, distal clavicle  resection, rotator cuff repair/notes 02/02/2011  .  TONSILLECTOMY  1940s  . TOTAL KNEE ARTHROPLASTY Bilateral ~ 2002  . TOTAL THYROIDECTOMY  ~ 1966    Family History  Problem Relation Age of Onset  . Asthma Mother   . Ulcers Father   . Anemia Sister     Social History:  reports that she has never smoked. She has never used smokeless tobacco. She reports that she does not drink alcohol and does not  use drugs.  Allergies: No Known Allergies  Medications:  Scheduled: . sodium chloride   Intravenous Once  . acidophilus  1 capsule Oral Daily  . atorvastatin  40 mg Oral Daily  . carvedilol  3.125 mg Oral BID WC  . citalopram  10 mg Oral Daily  . darbepoetin (ARANESP) injection - NON-DIALYSIS  40 mcg Subcutaneous Q Wed-1800  . donepezil  5 mg Oral QHS  . furosemide  20 mg Intravenous Once  . furosemide  20 mg Oral Daily  . insulin aspart  0-15 Units Subcutaneous TID AC & HS  . levothyroxine  125 mcg Oral Daily  . pantoprazole  40 mg Oral BID   Continuous:   Results for orders placed or performed during the hospital encounter of 05/14/20 (from the past 24 hour(s))  CBC     Status: Abnormal   Collection Time: 05/21/20 12:41 PM  Result Value Ref Range   WBC 11.3 (H) 4.0 - 10.5 K/uL   RBC 2.47 (L) 3.87 - 5.11 MIL/uL   Hemoglobin 7.4 (L) 12.0 - 15.0 g/dL   HCT 23.9 (L) 36 - 46 %   MCV 96.8 80.0 - 100.0 fL   MCH 30.0 26.0 - 34.0 pg   MCHC 31.0 30.0 - 36.0 g/dL   RDW 14.8 11.5 - 15.5 %   Platelets 209 150 - 400 K/uL   nRBC 0.2 0.0 - 0.2 %  Glucose, capillary     Status: Abnormal   Collection Time: 05/21/20  4:27 PM  Result Value Ref Range   Glucose-Capillary 200 (H) 70 - 99 mg/dL  Glucose, capillary     Status: Abnormal   Collection Time: 05/21/20  9:18 PM  Result Value Ref Range   Glucose-Capillary 232 (H) 70 - 99 mg/dL  CBC     Status: Abnormal   Collection Time: 05/22/20  6:07 AM  Result Value Ref Range   WBC 9.2 4.0 - 10.5 K/uL   RBC 2.14 (L) 3.87 - 5.11 MIL/uL   Hemoglobin 6.5 (LL) 12.0 - 15.0 g/dL   HCT 21.0 (L) 36 - 46 %   MCV 98.1 80.0 - 100.0 fL   MCH 30.4 26.0 - 34.0 pg   MCHC 31.0 30.0 - 36.0 g/dL   RDW 14.8 11.5 - 15.5 %   Platelets 235 150 - 400 K/uL   nRBC 0.0 0.0 - 0.2 %  Basic metabolic panel     Status: Abnormal   Collection Time: 05/22/20  6:07 AM  Result Value Ref Range   Sodium 137 135 - 145 mmol/L   Potassium 4.1 3.5 - 5.1 mmol/L   Chloride  107 98 - 111 mmol/L   CO2 21 (L) 22 - 32 mmol/L   Glucose, Bld 122 (H) 70 - 99 mg/dL   BUN 79 (H) 8 - 23 mg/dL   Creatinine, Ser 1.91 (H) 0.44 - 1.00 mg/dL   Calcium 6.9 (L) 8.9 - 10.3 mg/dL   GFR calc non Af Amer 24 (L) >60 mL/min   GFR calc Af Amer 27 (L) >60 mL/min  Anion gap 9 5 - 15  Glucose, capillary     Status: Abnormal   Collection Time: 05/22/20  6:57 AM  Result Value Ref Range   Glucose-Capillary 107 (H) 70 - 99 mg/dL  Prepare RBC (crossmatch)     Status: None   Collection Time: 05/22/20  8:14 AM  Result Value Ref Range   Order Confirmation      ORDER PROCESSED BY BLOOD BANK Performed at [facility], [place] [address] [place], [place] [address]   Type and screen [place]     Status: None (Preliminary result)   Collection Time: 05/22/20  9:02 AM  Result Value Ref Range   ABO/RH(D) A POS    Antibody Screen NEG    Sample Expiration 05/25/2020,2359    Unit Number [id number]    Blood Component Type RED CELLS,LR    Unit division 00    Status of Unit ISSUED    Transfusion Status OK TO TRANSFUSE    Crossmatch Result      Compatible Performed at [facility], [place] [address] [place], [place] [address]    Unit Number [id number]    Blood Component Type RED CELLS,LR    Unit division 00    Status of Unit ALLOCATED    Transfusion Status OK TO TRANSFUSE    Crossmatch Result Compatible   Glucose, capillary     Status: Abnormal   Collection Time: 05/22/20 11:27 AM  Result Value Ref Range   Glucose-Capillary 118 (H) 70 - 99 mg/dL     No results found.  ROS:  As stated above in the HPI otherwise negative.  Blood pressure (!) 148/44, pulse (!) 57, temperature 98 F (36.7 C), temperature source Oral, resp. rate 18, height 4' 11.02" (1.499 m), weight 56.7 kg, SpO2 100 %.    PE: Gen: NAD, Alert and Oriented HEENT:  [place]/AT, EOMI Neck: Supple, no LAD Lungs: CTA Bilaterally CV: RRR without M/G/R ABD: Soft, NTND, +BS Ext: No  C/C/E  Assessment/Plan: 1) IDA. 2) Heme positive stool. 3) CKD. 4) CHF.   Connie [name] is well-known to me for her anemia issues.  It appears that she is having bleeding again and a repeat endoscopic evaluation is warranted.  Based on the 2019 findings, the plan is to pursue examination of the upper GI tract first with an enteroscopy.  Plan: 1) Enteroscopy tomorrow. 2) Agree with the transfusion. Jelisa  D 05/22/2020, 12:24 PM

## 2020-05-22 NOTE — Plan of Care (Signed)
  Problem: Activity: Goal: Risk for activity intolerance will decrease Outcome: Progressing   

## 2020-05-22 NOTE — H&P (View-Only) (Signed)
Reason for Consult: Anemia and heme positive stool Referring Physician: [facility]  Lovena Le Threats HPI: This is an 86 year old female with multiple medical problems admitted for UTI, acute metabolic encephalopathy, AKI, and anemia.  Over the course of her hospitalization she improved clinically with her metabolic issues, but she was noted to have progressive drop in her HGB.  Her HGB today is at 6.5 g/dL and she is heme positive.  Her last endoscopic evaluation for recurrent IDA was 07/24/2018.  At that time the source of the bleeding was felt to be from the upper GI tract as hematin was noted in the gastric lumen.  Nonbleeding AVMs were found in the colon and they were ablated with APC.  Past Medical History:  Diagnosis Date  . Allergy    RHINITIS  . Angiodysplasia of duodenum    hx/notes 11/21/2015  . Angiodysplasia of stomach    hx/notes 11/21/2015  . Arthritis    "joints ache"  . Bleeding gastric ulcer    hx/notes 11/21/2015  . Chronic blood loss anemia    secondary to angiodysplasia of the stomach and duodenum as well as history of bleeding gastric ulcer hx/notes 11/21/2015  . Chronic systolic CHF (congestive heart failure) ([place]) 12/23/2015  . History of blood transfusion 01/2015; 06/2015; 11/21/2015  . Hypercholesteremia   . Hypertension   . Hypothyroidism   . Obesity   . Pneumonia "several times"  . Type II diabetes mellitus ([place])     Past Surgical History:  Procedure Laterality Date  . BIOPSY  07/24/2018   Procedure: BIOPSY;  Surgeon: Carol Ada, MD;  [facility];  Service: Endoscopy;;  . CARDIAC CATHETERIZATION N/A 12/19/2015   Procedure: Left Heart Cath and Coronary Angiography;  Surgeon: Leonie Man, MD;  Location: [facility];  Service: Cardiovascular;  Laterality: N/A;  . COLONOSCOPY N/A 08/30/2014   Procedure: COLONOSCOPY;  Surgeon: Beryle Beams, MD;  [facility];  Service: Endoscopy;  Laterality: N/A;  . COLONOSCOPY N/A 02/21/2015    Procedure: COLONOSCOPY;  Surgeon: Carol Ada, MD;  Location: [facility] ENDOSCOPY;  Service: Endoscopy;  Laterality: N/A;  . COLONOSCOPY WITH PROPOFOL N/A 07/24/2018   Procedure: COLONOSCOPY WITH PROPOFOL;  Surgeon: Carol Ada, MD;  [facility];  Service: Endoscopy;  Laterality: N/A;  . ENTEROSCOPY N/A 06/06/2015   Procedure: ENTEROSCOPY;  Surgeon: Carol Ada, MD;  [facility];  Service: Endoscopy;  Laterality: N/A;  . ENTEROSCOPY N/A 07/24/2018   Procedure: ENTEROSCOPY;  Surgeon: Carol Ada, MD;  [facility];  Service: Endoscopy;  Laterality: N/A;  . FRACTURE SURGERY    . GIVENS CAPSULE STUDY N/A 05/15/2015   Procedure: GIVENS CAPSULE STUDY;  Surgeon: Carol Ada, MD;  Location: [facility] ENDOSCOPY;  Service: Endoscopy;  Laterality: N/A;  . HOT HEMOSTASIS N/A 08/30/2014   Procedure: HOT HEMOSTASIS (ARGON PLASMA COAGULATION/BICAP);  Surgeon: Beryle Beams, MD;  Location: [facility];  Service: Endoscopy;  Laterality: N/A;  . HOT HEMOSTASIS N/A 06/06/2015   Procedure: HOT HEMOSTASIS (ARGON PLASMA COAGULATION/BICAP);  Surgeon: Carol Ada, MD;  Location: [facility];  Service: Endoscopy;  Laterality: N/A;  . HOT HEMOSTASIS N/A 07/24/2018   Procedure: HOT HEMOSTASIS (ARGON PLASMA COAGULATION/BICAP);  Surgeon: Carol Ada, MD;  Location: [facility];  Service: Endoscopy;  Laterality: N/A;  . JOINT REPLACEMENT    . REVISION TOTAL KNEE ARTHROPLASTY Bilateral 2004-2015  . SHOULDER OPEN ROTATOR CUFF REPAIR Right 12/2004   open subacromial decompression, distal clavicle  resection, rotator cuff repair/notes 02/02/2011  .  TONSILLECTOMY  1940s  . TOTAL KNEE ARTHROPLASTY Bilateral ~ 2002  . TOTAL THYROIDECTOMY  ~ 1966    Family History  Problem Relation Age of Onset  . Asthma Mother   . Ulcers Father   . Anemia Sister     Social History:  reports that she has never smoked. She has never used smokeless tobacco. She reports that she does not drink alcohol and does not  use drugs.  Allergies: No Known Allergies  Medications:  Scheduled: . sodium chloride   Intravenous Once  . acidophilus  1 capsule Oral Daily  . atorvastatin  40 mg Oral Daily  . carvedilol  3.125 mg Oral BID WC  . citalopram  10 mg Oral Daily  . darbepoetin (ARANESP) injection - NON-DIALYSIS  40 mcg Subcutaneous Q Wed-1800  . donepezil  5 mg Oral QHS  . furosemide  20 mg Intravenous Once  . furosemide  20 mg Oral Daily  . insulin aspart  0-15 Units Subcutaneous TID AC & HS  . levothyroxine  125 mcg Oral Daily  . pantoprazole  40 mg Oral BID   Continuous:   Results for orders placed or performed during the hospital encounter of 05/14/20 (from the past 24 hour(s))  CBC     Status: Abnormal   Collection Time: 05/21/20 12:41 PM  Result Value Ref Range   WBC 11.3 (H) 4.0 - 10.5 K/uL   RBC 2.47 (L) 3.87 - 5.11 MIL/uL   Hemoglobin 7.4 (L) 12.0 - 15.0 g/dL   HCT 23.9 (L) 36 - 46 %   MCV 96.8 80.0 - 100.0 fL   MCH 30.0 26.0 - 34.0 pg   MCHC 31.0 30.0 - 36.0 g/dL   RDW 14.8 11.5 - 15.5 %   Platelets 209 150 - 400 K/uL   nRBC 0.2 0.0 - 0.2 %  Glucose, capillary     Status: Abnormal   Collection Time: 05/21/20  4:27 PM  Result Value Ref Range   Glucose-Capillary 200 (H) 70 - 99 mg/dL  Glucose, capillary     Status: Abnormal   Collection Time: 05/21/20  9:18 PM  Result Value Ref Range   Glucose-Capillary 232 (H) 70 - 99 mg/dL  CBC     Status: Abnormal   Collection Time: 05/22/20  6:07 AM  Result Value Ref Range   WBC 9.2 4.0 - 10.5 K/uL   RBC 2.14 (L) 3.87 - 5.11 MIL/uL   Hemoglobin 6.5 (LL) 12.0 - 15.0 g/dL   HCT 21.0 (L) 36 - 46 %   MCV 98.1 80.0 - 100.0 fL   MCH 30.4 26.0 - 34.0 pg   MCHC 31.0 30.0 - 36.0 g/dL   RDW 14.8 11.5 - 15.5 %   Platelets 235 150 - 400 K/uL   nRBC 0.0 0.0 - 0.2 %  Basic metabolic panel     Status: Abnormal   Collection Time: 05/22/20  6:07 AM  Result Value Ref Range   Sodium 137 135 - 145 mmol/L   Potassium 4.1 3.5 - 5.1 mmol/L   Chloride  107 98 - 111 mmol/L   CO2 21 (L) 22 - 32 mmol/L   Glucose, Bld 122 (H) 70 - 99 mg/dL   BUN 79 (H) 8 - 23 mg/dL   Creatinine, Ser 1.91 (H) 0.44 - 1.00 mg/dL   Calcium 6.9 (L) 8.9 - 10.3 mg/dL   GFR calc non Af Amer 24 (L) >60 mL/min   GFR calc Af Amer 27 (L) >60 mL/min  Anion gap 9 5 - 15  Glucose, capillary     Status: Abnormal   Collection Time: 05/22/20  6:57 AM  Result Value Ref Range   Glucose-Capillary 107 (H) 70 - 99 mg/dL  Prepare RBC (crossmatch)     Status: None   Collection Time: 05/22/20  8:14 AM  Result Value Ref Range   Order Confirmation      ORDER PROCESSED BY BLOOD BANK Performed at [facility], [place] [address], [place], [place] [address]   Type and screen [place]     Status: None (Preliminary result)   Collection Time: 05/22/20  9:02 AM  Result Value Ref Range   ABO/RH(D) A POS    Antibody Screen NEG    Sample Expiration 05/25/2020,2359    Unit Number [id number]    Blood Component Type RED CELLS,LR    Unit division 00    Status of Unit ISSUED    Transfusion Status OK TO TRANSFUSE    Crossmatch Result      Compatible Performed at [facility], [place] [address], [place], [place] [address]    Unit Number [id number]    Blood Component Type RED CELLS,LR    Unit division 00    Status of Unit ALLOCATED    Transfusion Status OK TO TRANSFUSE    Crossmatch Result Compatible   Glucose, capillary     Status: Abnormal   Collection Time: 05/22/20 11:27 AM  Result Value Ref Range   Glucose-Capillary 118 (H) 70 - 99 mg/dL     No results found.  ROS:  As stated above in the HPI otherwise negative.  Blood pressure (!) 148/44, pulse (!) 57, temperature 98 F (36.7 C), temperature source Oral, resp. rate 18, height 4' 11.02" (1.499 m), weight 56.7 kg, SpO2 100 %.    PE: Gen: NAD, Alert and Oriented HEENT:  [place]/AT, EOMI Neck: Supple, no LAD Lungs: CTA Bilaterally CV: RRR without M/G/R ABD: Soft, NTND, +BS Ext: No  C/C/E  Assessment/Plan: 1) IDA. 2) Heme positive stool. 3) CKD. 4) CHF.   Connie [name] is well-known to me for her anemia issues.  It appears that she is having bleeding again and a repeat endoscopic evaluation is warranted.  Based on the 2019 findings, the plan is to pursue examination of the upper GI tract first with an enteroscopy.  Plan: 1) Enteroscopy tomorrow. 2) Agree with the transfusion. Samanthan Dugo D 05/22/2020, 12:24 PM

## 2020-05-23 ENCOUNTER — Inpatient Hospital Stay (HOSPITAL_COMMUNITY): Payer: Medicare HMO | Admitting: Certified Registered Nurse Anesthetist

## 2020-05-23 ENCOUNTER — Encounter (HOSPITAL_COMMUNITY): Payer: Self-pay | Admitting: Internal Medicine

## 2020-05-23 ENCOUNTER — Encounter (HOSPITAL_COMMUNITY)
Admission: EM | Disposition: A | Discharge status: Nursing Home (Includes ICF) | Payer: Self-pay | Source: Skilled Nursing Facility | Attending: Internal Medicine

## 2020-05-23 HISTORY — PX: ENTEROSCOPY: SHX5533

## 2020-05-23 HISTORY — PX: HOT HEMOSTASIS: SHX5433

## 2020-05-23 HISTORY — PX: HEMOSTASIS CLIP PLACEMENT: SHX6857

## 2020-05-23 LAB — TYPE AND SCREEN
ABO/RH(D): A POS
Antibody Screen: NEGATIVE
Unit division: 0
Unit division: 0

## 2020-05-23 LAB — BPAM RBC
Blood Product Expiration Date: 202109292359
Blood Product Expiration Date: 202109302359
ISSUE DATE / TIME: 202109021147
ISSUE DATE / TIME: 202109021621
Unit Type and Rh: 6200
Unit Type and Rh: 6200

## 2020-05-23 LAB — CBC
HCT: 29.3 % — ABNORMAL LOW (ref 36.0–46.0)
Hemoglobin: 9.4 g/dL — ABNORMAL LOW (ref 12.0–15.0)
MCH: 30.7 pg (ref 26.0–34.0)
MCHC: 32.1 g/dL (ref 30.0–36.0)
MCV: 95.8 fL (ref 80.0–100.0)
Platelets: 221 10*3/uL (ref 150–400)
RBC: 3.06 MIL/uL — ABNORMAL LOW (ref 3.87–5.11)
RDW: 14.6 % (ref 11.5–15.5)
WBC: 9.6 10*3/uL (ref 4.0–10.5)
nRBC: 0.6 % — ABNORMAL HIGH (ref 0.0–0.2)

## 2020-05-23 LAB — BASIC METABOLIC PANEL
Anion gap: 10 (ref 5–15)
BUN: 78 mg/dL — ABNORMAL HIGH (ref 8–23)
CO2: 20 mmol/L — ABNORMAL LOW (ref 22–32)
Calcium: 7 mg/dL — ABNORMAL LOW (ref 8.9–10.3)
Chloride: 109 mmol/L (ref 98–111)
Creatinine, Ser: 1.74 mg/dL — ABNORMAL HIGH (ref 0.44–1.00)
GFR calc Af Amer: 31 mL/min — ABNORMAL LOW (ref >60)
GFR calc non Af Amer: 26 mL/min — ABNORMAL LOW (ref >60)
Glucose, Bld: 95 mg/dL (ref 70–99)
Potassium: 4.1 mmol/L (ref 3.5–5.1)
Sodium: 139 mmol/L (ref 135–145)

## 2020-05-23 LAB — GLUCOSE, CAPILLARY
Glucose-Capillary: 91 mg/dL (ref 70–99)
Glucose-Capillary: 91 mg/dL (ref 70–99)
Glucose-Capillary: 89 mg/dL (ref 70–99)
Glucose-Capillary: 95 mg/dL (ref 70–99)

## 2020-05-23 SURGERY — ENTEROSCOPY
Anesthesia: Monitor Anesthesia Care

## 2020-05-23 MED ORDER — PROPOFOL 10 MG/ML IV BOLUS
INTRAVENOUS | Status: DC | PRN
  Administered 2020-05-23: 40 mg via INTRAVENOUS

## 2020-05-23 MED ORDER — LACTATED RINGERS IV SOLN: INTRAVENOUS | Status: DC | PRN

## 2020-05-23 MED ORDER — EPHEDRINE SULFATE-NACL 50-0.9 MG/10ML-% IV SOSY
PREFILLED_SYRINGE | INTRAVENOUS | Status: DC | PRN
  Administered 2020-05-23: 15 mg via INTRAVENOUS

## 2020-05-23 MED ORDER — PHENYLEPHRINE 40 MCG/ML (10ML) SYRINGE FOR IV PUSH (FOR BLOOD PRESSURE SUPPORT)
PREFILLED_SYRINGE | INTRAVENOUS | Status: DC | PRN
  Administered 2020-05-23: 40 ug via INTRAVENOUS

## 2020-05-23 MED ORDER — GLYCOPYRROLATE 0.2 MG/ML IJ SOLN
INTRAMUSCULAR | Status: DC | PRN
  Administered 2020-05-23: .1 mg via INTRAVENOUS

## 2020-05-23 MED ORDER — PROPOFOL 500 MG/50ML IV EMUL
INTRAVENOUS | Status: DC | PRN
  Administered 2020-05-23: 125 ug/kg/min via INTRAVENOUS

## 2020-05-23 NOTE — Op Note (Signed)
[facility] Patient Name: Connie [name] Procedure Date : 05/23/2020 MRN: [id number] Attending MD: Carol Ada , MD Date of Birth: [birth year] CSN: [id number] Age: 86 Admit Type: Inpatient Procedure:                Small bowel enteroscopy Indications:              Iron deficiency anemia, Melena Providers:                Carol Ada, MD, Doristine Johns, RN, Tyna Jaksch Technician Referring MD:              Medicines:                 Complications:            No immediate complications. Estimated Blood Loss:     Estimated blood loss: none. Procedure:                Pre-Anesthesia Assessment:                           - Prior to the procedure, a History and Physical                            was performed, and patient medications and                            allergies were reviewed. The patient's tolerance of                            previous anesthesia was also reviewed. The risks                            and benefits of the procedure and the sedation                            options and risks were discussed with the patient.                            All questions were answered, and informed consent                            was obtained. Prior Anticoagulants: The patient has                            taken no previous anticoagulant or antiplatelet                            agents. ASA Grade Assessment: III - A patient with                            severe systemic disease. After reviewing the risks  and benefits, the patient was deemed in                            satisfactory condition to undergo the procedure.                           - Sedation was administered by an anesthesia                            professional. Deep sedation was attained.                           After obtaining informed consent, the endoscope was                            passed under direct vision. Throughout the                             procedure, the patient's blood pressure, pulse, and                            oxygen saturations were monitored continuously. The                            [id number]-[id number] ([id number]) Olympus pediatric colonscope                            was introduced through the mouth and advanced to                            the mid-jejunum. The small bowel enteroscopy was                            accomplished without difficulty. The patient                            tolerated the procedure well. Scope In: Scope Out: Findings:      The esophagus was normal.      The stomach was normal.      A single angiodysplastic lesion with no bleeding was found in the second       portion of the duodenum. Coagulation for tissue destruction using       monopolar probe was successful. Estimated blood loss: none.      A few angiodysplastic lesions with no bleeding were found in the       proximal jejunum and in the mid-jejunum. Coagulation for hemostasis       using monopolar probe was successful. To stop active bleeding, three       hemostatic clips were successfully placed (MR unsafe). There was no       bleeding at the end of the procedure.      Deep intubation of the jejunum was achieved twice. Several small       nonbleeding AVMs were ablated. One AVM, in the proximal jejunum, was       identified. Application of APC to the area precipitated bleeding.  Several further applications of APC slowed down the bleeding, but it       continued to ooze. Four hemoclips were placed and three successfully       remained in place. No further bleeding was observed. Impression:               - Normal esophagus.                           - Normal stomach.                           - A single non-bleeding angiodysplastic lesion in                            the duodenum. Treated with a monopolar probe.                           - A few non-bleeding angiodysplastic lesions in the                             duodenum. Treated with a monopolar probe. Clips (MR                            unsafe) were placed.                           - No specimens collected. Recommendation:           - Return patient to hospital ward for ongoing care.                           - Resume regular diet.                           - Follow HGB and transfuse as necessary. Procedure Code(s):        --- Professional ---                           [phone], Small intestinal endoscopy, enteroscopy                            beyond second portion of duodenum, not including                            ileum; with ablation of tumor(s), polyp(s), or                            other lesion(s) not amenable to removal by hot                            biopsy forceps, bipolar cautery or snare technique                           [id number], 59, Small intestinal endoscopy, enteroscopy  beyond second portion of duodenum, not including                            ileum; with control of bleeding (eg, injection,                            bipolar cautery, unipolar cautery, laser, heater                            probe, stapler, plasma coagulator) Diagnosis Code(s):        --- Professional ---                           K31.819, Angiodysplasia of stomach and duodenum                            without bleeding                           D50.9, Iron deficiency anemia, unspecified                           K92.1, Melena (includes Hematochezia) CPT copyright 2019 [employer]. All rights reserved. The codes documented in this report are preliminary and upon coder review may  be revised to meet current compliance requirements. Carol Ada, MD Carol Ada, MD 05/23/2020 3:21:06 PM This report has been signed electronically. Number of Addenda: 0

## 2020-05-23 NOTE — Anesthesia Preprocedure Evaluation (Addendum)
Anesthesia Evaluation  Patient identified by MRN, date of birth, ID band Patient confused    Reviewed: Allergy & Precautions, NPO status , Patient's Chart, lab work & pertinent test results  History of Anesthesia Complications Negative for: history of anesthetic complications  Airway Mallampati: II  TM Distance: >3 FB Neck ROM: Full    Dental  (+) Dental Advisory Given   Pulmonary neg shortness of breath, neg COPD, neg recent URI,  Covid-19 Nucleic Acid Test Results Lab Results      Component                Value               Date                      SARSCOV2NAA              NEGATIVE            05/14/2020              breath sounds clear to auscultation       Cardiovascular hypertension, Pt. on home beta blockers and Pt. on medications (-) angina+ CAD, + Peripheral Vascular Disease and +CHF   Rhythm:Regular Rate:Bradycardia  Sinus rhythm Atrial premature complexes Otherwise within normal limits When compared with ECG of 07/22/2018, Right axis deviation is no longer present Confirmed by Delora Fuel ([id number]) on 05/14/2020 11:17:31 PM   2018 ECHO:  - Left ventricle: The cavity size was normal. Wall thickness was  normal. Systolic function was normal. The estimated ejection  fraction was in the range of 55% to 60%. Wall motion was normal;  there were no regional wall motion abnormalities. Doppler  parameters are consistent with abnormal left ventricular  relaxation (grade 1 diastolic dysfunction).  - Aortic valve: Trileaflet; mildly thickened, mildly calcified  leaflets. There was mild regurgitation.  - Mitral valve: There was mild regurgitation.  - Left atrium: The atrium was moderately to severely dilated.   2017:  1. Ost LM to LM lesion, 40% stenosed. 2. Ost RCA lesion, 20% stenosed. 3. There is moderate left ventricular systolic dysfunction.    Likely nonischemic cardiomyopathy. There is coronary  calcification noted in both the ostial RCA and Left Main, but no significant stenosis noted.     Neuro/Psych negative neurological ROS  negative psych ROS   GI/Hepatic PUD, GERD  Medicated,? GI bleed   Endo/Other  diabetesHypothyroidism   Renal/GU ARFRenal diseaseLab Results      Component                Value               Date                      CREATININE               1.74 (H)            05/23/2020           Lab Results      Component                Value               Date                      K  4.1                 05/23/2020                Musculoskeletal  (+) Arthritis ,   Abdominal   Peds  Hematology  (+) anemia , S/p 2uPRBCs  Lab Results      Component                Value               Date                      WBC                      9.6                 05/23/2020                HGB                      9.4 (L)             05/23/2020                HCT                      29.3 (L)            05/23/2020                MCV                      95.8                05/23/2020                PLT                      221                 05/23/2020              Anesthesia Other Findings   Reproductive/Obstetrics                          Anesthesia Physical Anesthesia Plan  ASA: III  Anesthesia Plan: MAC   Post-op Pain Management:    Induction: Intravenous  PONV Risk Score and Plan: 2 and Treatment may vary due to age or medical condition and Propofol infusion  Airway Management Planned: Nasal Cannula  Additional Equipment: None  Intra-op Plan:   Post-operative Plan:   Informed Consent: I have reviewed the patients History and Physical, chart, labs and discussed the procedure including the risks, benefits and alternatives for the proposed anesthesia with the patient or authorized representative who has indicated his/her understanding and acceptance.     Dental advisory given and Consent reviewed  with POA  Plan Discussed with: CRNA and Surgeon  Anesthesia Plan Comments:         Anesthesia Quick Evaluation                                  Anesthesia Evaluation  Patient identified by MRN, date of birth, ID band Patient awake   ASA: III  Anesthesia Plan: MAC  Post-op Pain Management:    Induction: Intravenous  PONV Risk Score and Plan: 2 and Propofol infusion and Treatment may vary due to age or medical condition  Airway Management Planned: Nasal Cannula and Natural Airway  Additional Equipment: None  Intra-op Plan:   Post-operative Plan:   Informed Consent: I have reviewed the patients History and Physical, chart, labs and discussed the procedure including the risks, benefits and alternatives for the proposed anesthesia with the patient or authorized representative who has indicated his/her understanding and acceptance.     Plan Discussed with: CRNA and Anesthesiologist  Anesthesia Plan Comments:        Anesthesia Quick Evaluation

## 2020-05-23 NOTE — Interval H&P Note (Signed)
History and Physical Interval Note:  05/23/2020 2:31 PM  Connie [name]  has presented today for surgery, with the diagnosis of IDA.  The various methods of treatment have been discussed with the patient and family. After consideration of risks, benefits and other options for treatment, the patient has consented to  Procedure(s): ENTEROSCOPY (N/A) as a surgical intervention.  The patient's history has been reviewed, patient examined, no change in status, stable for surgery.  I have reviewed the patient's chart and labs.  Questions were answered to the patient's satisfaction.     Isabelle Matt D

## 2020-05-23 NOTE — Progress Notes (Signed)
Progress Note    Connie [name]  XNA:[id number] DOB: [birth year]  DOA: 05/14/2020 PCP: [name], MD    Brief Narrative:    Medical records reviewed and are as summarized below:  Connie [name] is an 86 y.o. female with past medical history of systolic and diastolic congestive heart failure (EF 55-60% 2018), hypothyroidism, diabetes mellitus type 2,  GI bleeding (2016 and 07/2018) with chronic iron deficiency anemia, hyperlipidemia, hypertension who presents to [facility] emergency department due to change in mental status.   Found to have AKI and UTI.  Renal function slowly improving.  Now w/ worsening anemia   Assessment/Plan:   Acute kidney injury -Creatinine up from baseline of 1.4 up to 4.3 on admission -Secondary to prerenal azotemia worsened by infection, ongoing diuretic use and Entresto -Renal ultrasound without hydronephrosis -Kidney function improved with hydration, creatinine down to 1.7, oral Lasix resumed -Holding Entresto at this time, resume at discharge  Acute on chronic anemia -history of known chronic iron deficiency anemia from GI blood loss -Multiple hospitalizations for same, last endoscopic work-up in 07/2018 revealed gastritis and nonbleeding colonic angiodysplastic lesions on colonoscopy. -Transfused 3 units of PRBC this admission, hemoglobin up to 9.4 this morning -Anemia panel last week without overt iron deficiency -Gastroenterology Dr. Benson Norway following, plan for endoscopy today -CBC in a.m.    Acute metabolic encephalopathy- but per son baseline is confused -Worsened in the setting of renal failure, UTI -Improving, now back to baseline    Hyperkalemia -resolved    Finger pain due to gout/inflammatory arthritis -xray negative for fracture, ortho consult appreciated -improved, taper off prednisone     Hypothyroidism -Continue home regimen of Synthroid    Chronic combined systolic and diastolic congestive heart  failure ([name]) -Last echo with improvement in EF to 54%, grade 1 diastolic dysfunction -Continue Coreg, Lasix resumed, Entresto on hold    Bradycardia -She had sinus bradycardia noted at the time of admission -Coreg resumed at lower dose  Dementia -Continue Aricept   Family Communication/Anticipated D/C date and plan/Code Status   DVT prophylaxis: Hold Lovenox for GI  Code Status: Full Code  Disposition Plan: Status is: Inpatient Son updated on phone 9/1 Remains inpatient appropriate because:Inpatient level of care appropriate due to severity of illness   Dispo: The patient is from: alf              Anticipated d/c is to: ALF              Anticipated d/c date is: 2days if no further bleeding              Patient currently is not medically stable to d/c.  Medical Consultants:    ortho     Subjective:  -Feels okay, no events overnight, n.p.o. for endoscopy today  Objective:    Vitals:   05/22/20 1948 05/22/20 2124 05/23/20 0505 05/23/20 1020  BP: (!) 148/47 (!) 166/50 (!) 157/50 (!) 176/49  Pulse: (!) 56 60 (!) 54 (!) 50  Resp: 17 16 17 18   Temp: 97.9 F (36.6 C) 97.9 F (36.6 C) 98.1 F (36.7 C) 98.7 F (37.1 C)  TempSrc: Oral Oral Oral Oral  SpO2: 98% 98% 98% 99%  Weight:      Height:        Intake/Output Summary (Last 24 hours) at 05/23/2020 1112 Last data filed at 05/23/2020 0600 Gross per 24 hour  Intake 1136 ml  Output 3 ml  Net 1133 ml  Filed Weights   05/14/20 1740 05/15/20 1900 05/21/20 0511  Weight: 52.2 kg 56.6 kg 56.7 kg    Exam:  Gen: Elderly pleasant female sitting up in bed awake alert oriented x2, no distress, cognitive deficits noted CVS: S1-S2, regular rate rhythm Lungs: Clear bilaterally Abdomen: Soft, nontender, bowel sounds present Extremities: No edema,  hyperpigmentation on lower legs Skin: no new rashes   Data Reviewed:   I have personally reviewed following labs and imaging studies:  Labs: Labs show the following:    Basic Metabolic Panel: Recent Labs  Lab 05/19/20 0817 05/19/20 0817 05/20/20 0512 05/20/20 0512 05/21/20 0101 05/21/20 0101 05/22/20 0607 05/23/20 0840  NA 138  --  136  --  135  --  137 139  K 4.0   < > 4.3   < > 4.3   < > 4.1 4.1  CL 107  --  105  --  105  --  107 109  CO2 19*  --  18*  --  18*  --  21* 20*  GLUCOSE 134*  --  157*  --  100*  --  122* 95  BUN 42*  --  45*  --  62*  --  79* 78*  CREATININE 1.81*  --  1.76*  --  1.77*  --  1.91* 1.74*  CALCIUM 7.1*  --  6.9*  --  6.9*  --  6.9* 7.0*   < > = values in this interval not displayed.   GFR Estimated Creatinine Clearance: 18.5 mL/min (A) (by C-G formula based on SCr of 1.74 mg/dL (H)). Liver Function Tests: No results for input(s): AST, ALT, ALKPHOS, BILITOT, PROT, ALBUMIN in the last 168 hours. No results for input(s): LIPASE, AMYLASE in the last 168 hours. No results for input(s): AMMONIA in the last 168 hours. Coagulation profile No results for input(s): INR, PROTIME in the last 168 hours.  CBC: Recent Labs  Lab 05/20/20 0512 05/21/20 0101 05/21/20 1241 05/22/20 0607 05/23/20 0840  WBC 8.9 10.3 11.3* 9.2 9.6  HGB 8.1* 7.1* 7.4* 6.5* 9.4*  HCT 25.1* 22.7* 23.9* 21.0* 29.3*  MCV 97.3 96.6 96.8 98.1 95.8  PLT 165 179 209 235 221   Cardiac Enzymes: No results for input(s): CKTOTAL, CKMB, CKMBINDEX, TROPONINI in the last 168 hours. BNP (last 3 results) No results for input(s): PROBNP in the last 8760 hours. CBG: Recent Labs  Lab 05/22/20 0657 05/22/20 1127 05/22/20 1641 05/22/20 2124 05/23/20 0650  GLUCAP 107* 118* 155* 189* 91   D-Dimer: No results for input(s): DDIMER in the last 72 hours. Hgb A1c: No results for input(s): HGBA1C in the last 72 hours. Lipid Profile: No results for input(s): CHOL, HDL, LDLCALC, TRIG, CHOLHDL, LDLDIRECT in the last 72 hours. Thyroid function studies: No results for input(s): TSH, T4TOTAL, T3FREE, THYROIDAB in the last 72 hours.  Invalid input(s):  FREET3 Anemia work up: No results for input(s): VITAMINB12, FOLATE, FERRITIN, TIBC, IRON, RETICCTPCT in the last 72 hours. Sepsis Labs: Recent Labs  Lab 05/21/20 0101 05/21/20 1241 05/22/20 0607 05/23/20 0840  WBC 10.3 11.3* 9.2 9.6    Microbiology Recent Results (from the past 240 hour(s))  SARS Coronavirus 2 by RT PCR (hospital order, performed in [facility] hospital lab) Nasopharyngeal Nasopharyngeal Swab     Status: None   Collection Time: 05/14/20  6:29 PM   Specimen: Nasopharyngeal Swab  Result Value Ref Range Status   SARS Coronavirus 2 NEGATIVE NEGATIVE Final    Comment: (NOTE) SARS-CoV-2 target  nucleic acids are NOT DETECTED.  The SARS-CoV-2 RNA is generally detectable in upper and lower respiratory specimens during the acute phase of infection. The lowest concentration of SARS-CoV-2 viral copies this assay can detect is 250 copies / mL. A negative result does not preclude SARS-CoV-2 infection and should not be used as the sole basis for treatment or other patient management decisions.  A negative result may occur with improper specimen collection / handling, submission of specimen other than nasopharyngeal swab, presence of viral mutation(s) within the areas targeted by this assay, and inadequate number of viral copies (<250 copies / mL). A negative result must be combined with clinical observations, patient history, and epidemiological information.  Fact Sheet for Patients:   StrictlyIdeas.no  Fact Sheet for Healthcare Providers: BankingDealers.co.za  This test is not yet approved or  cleared by the [place] FDA and has been authorized for detection and/or diagnosis of SARS-CoV-2 by FDA under an Emergency Use Authorization (EUA).  This EUA will remain in effect (meaning this test can be used) for the duration of the COVID-19 declaration under Section 564(b)(1) of the Act, 21 U.S.C. section 360bbb-3(b)(1),  unless the authorization is terminated or revoked sooner.  Performed at [facility], [place] [address] [place], [place] [address]   Urine culture     Status: Abnormal   Collection Time: 05/14/20  6:56 PM   Specimen: Urine, Random  Result Value Ref Range Status   Specimen Description URINE, RANDOM  Final   Special Requests   Final    NONE Performed at [facility], [place] [address] [place], [place] [address]    Culture >=100,000 COLONIES/mL ESCHERICHIA COLI (A)  Final   Report Status 05/16/2020 FINAL  Final   Organism ID, Bacteria ESCHERICHIA COLI (A)  Final      Susceptibility   Escherichia coli - MIC*    AMPICILLIN 8 SENSITIVE Sensitive     CEFAZOLIN <=4 SENSITIVE Sensitive     CEFTRIAXONE <=0.25 SENSITIVE Sensitive     CIPROFLOXACIN <=0.25 SENSITIVE Sensitive     GENTAMICIN <=1 SENSITIVE Sensitive     IMIPENEM <=0.25 SENSITIVE Sensitive     NITROFURANTOIN 32 SENSITIVE Sensitive     TRIMETH/SULFA <=20 SENSITIVE Sensitive     AMPICILLIN/SULBACTAM <=2 SENSITIVE Sensitive     PIP/TAZO <=4 SENSITIVE Sensitive     * >=100,000 COLONIES/mL ESCHERICHIA COLI  Culture, blood (routine x 2)     Status: None   Collection Time: 05/14/20 11:22 PM   Specimen: BLOOD  Result Value Ref Range Status   Specimen Description BLOOD RIGHT ARM  Final   Special Requests   Final    BOTTLES DRAWN AEROBIC AND ANAEROBIC Blood Culture adequate volume   Culture   Final    NO GROWTH 5 DAYS Performed at [facility], [place] [address], [place], [place] [address]    Report Status 05/20/2020 FINAL  Final  Culture, blood (routine x 2)     Status: None   Collection Time: 05/14/20 11:22 PM   Specimen: BLOOD  Result Value Ref Range Status   Specimen Description BLOOD LEFT ARM  Final   Special Requests   Final    BOTTLES DRAWN AEROBIC AND ANAEROBIC Blood Culture adequate volume   Culture   Final    NO GROWTH 5 DAYS Performed at [facility], [place] [address] [place], [place] [address]     Report Status 05/20/2020 FINAL  Final  MRSA PCR Screening  Status: None   Collection Time: 05/15/20  8:24 PM   Specimen: Nasopharyngeal  Result Value Ref Range Status   MRSA by PCR NEGATIVE NEGATIVE Final    Comment:        The GeneXpert MRSA Assay (FDA approved for NASAL specimens only), is one component of a comprehensive MRSA colonization surveillance program. It is not intended to diagnose MRSA infection nor to guide or monitor treatment for MRSA infections. Performed at [facility], [place] [address], [place], [place] [address]     Procedures and diagnostic studies:  No results found.  Medications:   . sodium chloride   Intravenous Once  . acidophilus  1 capsule Oral Daily  . atorvastatin  40 mg Oral Daily  . carvedilol  3.125 mg Oral BID WC  . citalopram  10 mg Oral Daily  . darbepoetin (ARANESP) injection - NON-DIALYSIS  40 mcg Subcutaneous Q Wed-1800  . donepezil  5 mg Oral QHS  . furosemide  20 mg Oral Daily  . insulin aspart  0-15 Units Subcutaneous TID AC & HS  . levothyroxine  125 mcg Oral Daily  . pantoprazole  40 mg Oral BID   Continuous Infusions: . sodium chloride      LOS: 9 days   [facility]

## 2020-05-23 NOTE — Plan of Care (Signed)
  Problem: Activity: Goal: Risk for activity intolerance will decrease Outcome: Progressing   

## 2020-05-23 NOTE — Transfer of Care (Signed)
Immediate Anesthesia Transfer of Care Note  Patient: Connie [name]  Procedure(s) Performed: ENTEROSCOPY (N/A ) HOT HEMOSTASIS (ARGON PLASMA COAGULATION/BICAP) (N/A )  Patient Location: Endoscopy Unit  Anesthesia Type:MAC  Level of Consciousness: drowsy and patient cooperative  Airway & Oxygen Therapy: Patient Spontanous Breathing  Post-op Assessment: Report given to RN and Post -op Vital signs reviewed and stable  Post vital signs: Reviewed and stable  Last Vitals:  Vitals Value Taken Time  BP 182/33 05/23/20 1518  Temp    Pulse 53 05/23/20 1522  Resp 17 05/23/20 1522  SpO2 99 % 05/23/20 1522  Vitals shown include unvalidated device data.  Last Pain:  Vitals:   05/23/20 1328  TempSrc: Oral  PainSc: 0-No pain      Patients Stated Pain Goal: 0 (30/05/11 0211)  Complications: No complications documented.

## 2020-05-23 NOTE — Progress Notes (Signed)
Physical Therapy Treatment Patient Details Name: Connie [name] MRN: [id number] DOB: 1933-09-27 Today's Date: 05/23/2020    History of Present Illness The pt is an 86 yo female presenting from [facility] (ALF) with AMS. Upon work-up, pt found to have AKI, and UA is suggestive of UTI. PMH includes: CHF, hypothyroidism, DM II, GI bleed with chronic anemia, HLD, HTN.    PT Comments    Pt alert and agreeable to getting up this am.  She was eager to walk in lieu of exercise.  Emphasis on balance, repetitive cuing to improve safety, RW safety, progressing gait stability and stamina.    Follow Up Recommendations  Supervision/Assistance - 24 hour;Home health PT     Equipment Recommendations  None recommended by PT    Recommendations for Other Services       Precautions / Restrictions Precautions Precautions: Fall    Mobility  Bed Mobility Overal bed mobility: Needs Assistance Bed Mobility: Supine to Sit     Supine to sit: Supervision     General bed mobility comments: flat bed, no rails needed.  Transfers Overall transfer level: Needs assistance Equipment used: Rolling walker (2 wheeled) Transfers: Sit to/from Stand Sit to Stand: Supervision Stand pivot transfers: Supervision       General transfer comment: supervision for safety and some decision-making help  Ambulation/Gait Ambulation/Gait assistance: Min guard;Min assist (min with no assistive device) Gait Distance (Feet): 160 Feet (to bathroom for toileting.) Assistive device: Rolling walker (2 wheeled);1 person hand held assist Gait Pattern/deviations: Step-through pattern;Decreased stride length Gait velocity: decreased Gait velocity interpretation: <1.8 ft/sec, indicate of risk for recurrent falls General Gait Details: mildly unsteady gait with RW and guarded and halted without AD, pt wanting/needing HHA.   Stairs             Wheelchair Mobility    Modified Rankin (Stroke  Patients Only)       Balance Overall balance assessment: Needs assistance Sitting-balance support: No upper extremity supported Sitting balance-Leahy Scale: Good     Standing balance support: Bilateral upper extremity supported;Single extremity supported Standing balance-Leahy Scale: Poor Standing balance comment: reliant on UE support or AD                            Cognition Arousal/Alertness: Awake/alert Behavior During Therapy: WFL for tasks assessed/performed Overall Cognitive Status: Impaired/Different from baseline Area of Impairment: Memory                     Memory: Decreased short-term memory                Exercises      General Comments General comments (skin integrity, edema, etc.): pt had great response to session.  Decided against exercises after her "long" distance in the halls.      Pertinent Vitals/Pain Pain Assessment: No/denies pain Pain Intervention(s): Monitored during session    Home Living                      Prior Function            PT Goals (current goals can now be found in the care plan section) Acute Rehab PT Goals PT Goal Formulation: With patient Time For Goal Achievement: 05/30/20 Potential to Achieve Goals: Good Progress towards PT goals: Progressing toward goals    Frequency    Min 3X/week      PT Plan Current plan  remains appropriate    Co-evaluation              AM-PAC PT "6 Clicks" Mobility   Outcome Measure  Help needed turning from your back to your side while in a flat bed without using bedrails?: None Help needed moving from lying on your back to sitting on the side of a flat bed without using bedrails?: None Help needed moving to and from a bed to a chair (including a wheelchair)?: None Help needed standing up from a chair using your arms (e.g., wheelchair or bedside chair)?: None Help needed to walk in hospital room?: A Little Help needed climbing 3-5 steps with a  railing? : A Little 6 Click Score: 22    End of Session     Patient left: in chair;with call bell/phone within reach;with chair alarm set Nurse Communication: Mobility status PT Visit Diagnosis: Other abnormalities of gait and mobility (R26.89);Difficulty in walking, not elsewhere classified (R26.2)     Time: 3943-2003 PT Time Calculation (min) (ACUTE ONLY): 20 min  Charges:  $Gait Training: 8-22 mins                     05/23/2020  Connie [name]., PT Acute Rehabilitation Services [phone]  (pager) [phone]  (office)   Connie [name] 05/23/2020, 11:49 AM

## 2020-05-23 NOTE — Plan of Care (Signed)
  Problem: Clinical Measurements: Goal: Diagnostic test results will improve Outcome: Progressing   Problem: Activity: Goal: Risk for activity intolerance will decrease Outcome: Progressing   

## 2020-05-23 NOTE — TOC Progression Note (Addendum)
Transition of Care [facility]) - Progression Note    Patient Details  Name: Connie [name] MRN: [id number] Date of Birth: [birth year]  Transition of Care [facility]) CM/SW Contact  Connie Salina Mila Homer, LCSW Phone Number: 05/23/2020, 3:37 PM  Clinical Narrative:  CSW advised by MD that patient may be ready for discharge on Sunday. Call made to son Connie [name] [phone]) and he advised that his mother is at the [facility] facility. Call made to [facility] and CSW advised that nursing director was gone for the day. Receptionist informed that patient may be ready for discharge and was advised that a nurse tech - [place] [phone]) will be on duty on Sunday and can be contacted regarding patient's discharge.  Son contacted and informed that his mother may be ready for discharge on Sunday and asked about transport back to ALF. Mr. Pemberton indicated that he will transport his mother back to facility, and he was advised that he will be contacted regarding transporting his mom is she discharges on Sunday.     Expected Discharge Plan: Assisted Living Barriers to Discharge: Continued Medical Work up  Expected Discharge Plan and Services Expected Discharge Plan: Assisted Living In-house Referral: Clinical Social Work     Living arrangements for the past 2 months: Assisted Living Facility                                     Social Determinants of Health (SDOH) Interventions  No SDOH interventions requested or needed at this time  Readmission Risk Interventions No flowsheet data found.

## 2020-05-24 DIAGNOSIS — K31811 Angiodysplasia of stomach and duodenum with bleeding: Secondary | ICD-10-CM

## 2020-05-24 DIAGNOSIS — K921 Melena: Secondary | ICD-10-CM

## 2020-05-24 LAB — CBC
HCT: 31.8 % — ABNORMAL LOW (ref 36.0–46.0)
Hemoglobin: 10 g/dL — ABNORMAL LOW (ref 12.0–15.0)
MCH: 30.4 pg (ref 26.0–34.0)
MCHC: 31.4 g/dL (ref 30.0–36.0)
MCV: 96.7 fL (ref 80.0–100.0)
Platelets: 268 10*3/uL (ref 150–400)
RBC: 3.29 MIL/uL — ABNORMAL LOW (ref 3.87–5.11)
RDW: 14.8 % (ref 11.5–15.5)
WBC: 9.3 10*3/uL (ref 4.0–10.5)
nRBC: 0.8 % — ABNORMAL HIGH (ref 0.0–0.2)

## 2020-05-24 LAB — GLUCOSE, CAPILLARY
Glucose-Capillary: 83 mg/dL (ref 70–99)
Glucose-Capillary: 211 mg/dL — ABNORMAL HIGH (ref 70–99)
Glucose-Capillary: 101 mg/dL — ABNORMAL HIGH (ref 70–99)
Glucose-Capillary: 190 mg/dL — ABNORMAL HIGH (ref 70–99)

## 2020-05-24 LAB — BASIC METABOLIC PANEL
Anion gap: 11 (ref 5–15)
BUN: 62 mg/dL — ABNORMAL HIGH (ref 8–23)
CO2: 22 mmol/L (ref 22–32)
Calcium: 7.4 mg/dL — ABNORMAL LOW (ref 8.9–10.3)
Chloride: 108 mmol/L (ref 98–111)
Creatinine, Ser: 1.46 mg/dL — ABNORMAL HIGH (ref 0.44–1.00)
GFR calc Af Amer: 38 mL/min — ABNORMAL LOW (ref >60)
GFR calc non Af Amer: 33 mL/min — ABNORMAL LOW (ref >60)
Glucose, Bld: 88 mg/dL (ref 70–99)
Potassium: 3.7 mmol/L (ref 3.5–5.1)
Sodium: 141 mmol/L (ref 135–145)

## 2020-05-24 MED ORDER — WHITE PETROLATUM EX OINT
TOPICAL_OINTMENT | CUTANEOUS | Status: AC
  Filled 2020-05-24: qty 28.35

## 2020-05-24 MED ORDER — HYDRALAZINE HCL 20 MG/ML IJ SOLN: 10.0000 mg | Freq: Four times a day (QID) | INTRAMUSCULAR | Status: DC | PRN

## 2020-05-24 MED ORDER — HYDRALAZINE HCL 20 MG/ML IJ SOLN
10.0000 mg | Freq: Once | INTRAMUSCULAR | Status: AC
  Administered 2020-05-24: 10 mg via INTRAVENOUS
  Filled 2020-05-24: qty 1

## 2020-05-24 MED ORDER — FUROSEMIDE 40 MG PO TABS
40.0000 mg | ORAL_TABLET | Freq: Every day | ORAL | Status: DC
  Administered 2020-05-24 – 2020-05-25 (×2): 40 mg via ORAL
  Filled 2020-05-24: qty 1

## 2020-05-24 NOTE — Progress Notes (Addendum)
Patient ID: Connie [name], female   DOB: 06-Jul-1934, 86 y.o.   MRN: [id number]  GI Progress Note Covering for Drs. [place]   Subjective  Day # 9 CC: melena, iron def anemia  Enteroscopy yesterday - several  non bleeding AVM's in duodenum treated with APC  hgb 10 -stable- s/p 3 units total   Patient up in chair, has had solid lunch.  She has no complaints of abdominal discomfort, unaware of any melena or hematochezia.   Objective   Vital signs in last 24 hours: Temp:  [97.7 F (36.5 C)-98.1 F (36.7 C)] 97.7 F (36.5 C) (09/04 1044) Pulse Rate:  [45-67] 61 (09/04 1044) Resp:  [11-19] 16 (09/04 1044) BP: (86-198)/(17-68) 135/36 (09/04 1044) SpO2:  [96 %-100 %] 97 % (09/04 1044) Weight:  [56.7 kg] 56.7 kg (09/04 0529) Last BM Date: 05/22/20 General:   Elderly white female in NAD Heart:  Regular rate and rhythm; no murmurs Lungs: Respirations even and unlabored, lungs CTA bilaterally Abdomen:  Soft, nontender and nondistended. Normal bowel sounds. Extremities:  Without edema. Neurologic:  Alert and oriented,  grossly normal neurologically. Psych:  Cooperative. Normal mood and affect.  Intake/Output from previous day: 09/03 0701 - 09/04 0700 In: 560 [P.O.:360; I.V.:200] Out: 226 [Urine:225; Stool:1] Intake/Output this shift: No intake/output data recorded.  Lab Results: Recent Labs    05/22/20 0607 05/23/20 0840 05/24/20 0624  WBC 9.2 9.6 9.3  HGB 6.5* 9.4* 10.0*  HCT 21.0* 29.3* 31.8*  PLT 235 221 268   BMET Recent Labs    05/22/20 0607 05/23/20 0840 05/24/20 0624  NA 137 139 141  K 4.1 4.1 3.7  CL 107 109 108  CO2 21* 20* 22  GLUCOSE 122* 95 88  BUN 79* 78* 62*  CREATININE 1.91* 1.74* 1.46*  CALCIUM 6.9* 7.0* 7.4*      Assessment / Plan:    #56 86 year old white female with acute on chronic anemia, and presentation with melena found secondary to multiple duodenal AVMs which were treated with APC on 05/23/2020.  Patient stable without  further active bleeding  #2 acute kidney injury #3 acute metabolic encephalopathy #4 congestive heart failure #5 dementia  Plan:  Check hemoglobin in a.m. Patient will need serial hemoglobins as an outpatient Avoid ASA/NSAIDs for 2 weeks GI will sign off, available if needed Outpatient GI follow up with Dr. Carol Ada in 1 month    LOS: 10 days   Amy Esterwood PA-C 05/24/2020, 12:15 PM      Attending Physician Note   I have taken an interval history, reviewed the chart and examined the patient. I agree with the Advanced Practitioner's note, impression and recommendations.   Lucio Edward, MD [facility]

## 2020-05-24 NOTE — Progress Notes (Signed)
Patient's BP 182/42 HR 48. Patient denies any c/o. MD,Zierle-Ghosh text paged. Order received for hydralazine iv. Will continue to monitor.

## 2020-05-24 NOTE — Progress Notes (Signed)
Progress Note    Connie [name]  FBP:[id number] DOB: September 04, 1934  DOA: 05/14/2020 PCP: [name], MD    Brief Narrative:    Medical records reviewed and are as summarized below:  Connie [name] is an 86 y.o. female with past medical history of systolic and diastolic congestive heart failure (EF 55-60% 2018), hypothyroidism, diabetes mellitus type 2,  GI bleeding (2016 and 07/2018) with chronic iron deficiency anemia, hyperlipidemia, hypertension who presents to [facility] emergency department due to change in mental status.   Found to have AKI and UTI.  Renal function slowly improving.  Then developed worsening anemia with melena/heme positive stools   Assessment/Plan:   Acute kidney injury -Creatinine up from baseline of 1.4 up to 4.3 on admission -Secondary to prerenal azotemia worsened by infection, ongoing diuretic use and Entresto -Renal ultrasound without hydronephrosis -Kidney function improved with hydration, creatinine down to 1.7, oral Lasix resumed -Holding Entresto at this time, resume at discharge  Acute on chronic anemia  Multiple duodenal AVMs -history of known chronic iron deficiency anemia from GI blood loss -Multiple hospitalizations for same, last endoscopic work-up in 07/2018 revealed gastritis and nonbleeding colonic angiodysplastic lesions on colonoscopy. -Transfused 3 units of PRBC this admission, hemoglobin up to 9.4 this morning -Anemia panel last week without overt iron deficiency -Gastroenterology Dr. Benson Norway consulted, underwent endoscopy yesterday which noted multipleDuodenal AVMs which were treated, hemoglobin improved -Resume periodic iron infusions after discharge -CBC in a.m.    Acute metabolic encephalopathy-  -Worsened in the setting of renal failure, UTI -Improving, now back to baseline    Hyperkalemia -resolved    Finger pain due to gout/inflammatory arthritis -xray negative for fracture, ortho consult  appreciated -improved, taper off prednisone     Hypothyroidism -Continue home regimen of Synthroid    Chronic combined systolic and diastolic congestive heart failure ([name]) -Last echo with improvement in EF to 82%, grade 1 diastolic dysfunction -Continue Coreg, Lasix resumed, Entresto on hold    Bradycardia -She had sinus bradycardia noted at the time of admission -Coreg resumed at lower dose  Dementia -Continue Aricept   Family Communication/Anticipated D/C date and plan/Code Status   DVT prophylaxis: Held Lovenox for  GI bleed Code Status: Full Code  Disposition Plan: Status is: Inpatient Son updated on phone 9/1 Remains inpatient appropriate because:Inpatient level of care appropriate due to severity of illness   Dispo: The patient is from: alf              Anticipated d/c is to: ALF              Anticipated d/c date is: Tomorrow if stable              Patient currently is not medically stable to d/c.  Medical Consultants:    Ortho  GI     Subjective:  -Feels okay, denies any complaints this morning, sitting up in recliner eating breakfast  Objective:    Vitals:   05/23/20 2055 05/24/20 0155 05/24/20 0529 05/24/20 1044  BP: (!) 181/48 (!) 182/42 (!) 155/41 (!) 135/36  Pulse: (!) 49 (!) 48 67 61  Resp: 18 17 18 16   Temp: 97.8 F (36.6 C) 98.1 F (36.7 C) 98 F (36.7 C) 97.7 F (36.5 C)  TempSrc: Oral Oral  Oral  SpO2: 96% 97% 96% 97%  Weight:   56.7 kg   Height:        Intake/Output Summary (Last 24 hours) at 05/24/2020 1144  Last data filed at 05/24/2020 0547 Gross per 24 hour  Intake 560 ml  Output 226 ml  Net 334 ml   Filed Weights   05/15/20 1900 05/21/20 0511 05/24/20 0529  Weight: 56.6 kg 56.7 kg 56.7 kg    Exam:  Gen: Elderly pleasant female sitting up in bed, AAOx3, no distress, cognitive deficits noted CVS: S1-S2, regular rate rhythm Lungs: Clear bilaterally Abdomen: Soft, nontender, bowel sounds present Extremities: No edema,   hyperpigmentation on lower legs Skin: no new rashes   Data Reviewed:   I have personally reviewed following labs and imaging studies:  Labs: Labs show the following:   Basic Metabolic Panel: Recent Labs  Lab 05/20/20 0512 05/20/20 0512 05/21/20 0101 05/21/20 0101 05/22/20 0607 05/22/20 0607 05/23/20 0840 05/24/20 0624  NA 136  --  135  --  137  --  139 141  K 4.3   < > 4.3   < > 4.1   < > 4.1 3.7  CL 105  --  105  --  107  --  109 108  CO2 18*  --  18*  --  21*  --  20* 22  GLUCOSE 157*  --  100*  --  122*  --  95 88  BUN 45*  --  62*  --  79*  --  78* 62*  CREATININE 1.76*  --  1.77*  --  1.91*  --  1.74* 1.46*  CALCIUM 6.9*  --  6.9*  --  6.9*  --  7.0* 7.4*   < > = values in this interval not displayed.   GFR Estimated Creatinine Clearance: 22 mL/min (A) (by C-G formula based on SCr of 1.46 mg/dL (H)). Liver Function Tests: No results for input(s): AST, ALT, ALKPHOS, BILITOT, PROT, ALBUMIN in the last 168 hours. No results for input(s): LIPASE, AMYLASE in the last 168 hours. No results for input(s): AMMONIA in the last 168 hours. Coagulation profile No results for input(s): INR, PROTIME in the last 168 hours.  CBC: Recent Labs  Lab 05/21/20 0101 05/21/20 1241 05/22/20 0607 05/23/20 0840 05/24/20 0624  WBC 10.3 11.3* 9.2 9.6 9.3  HGB 7.1* 7.4* 6.5* 9.4* 10.0*  HCT 22.7* 23.9* 21.0* 29.3* 31.8*  MCV 96.6 96.8 98.1 95.8 96.7  PLT 179 209 235 221 268   Cardiac Enzymes: No results for input(s): CKTOTAL, CKMB, CKMBINDEX, TROPONINI in the last 168 hours. BNP (last 3 results) No results for input(s): PROBNP in the last 8760 hours. CBG: Recent Labs  Lab 05/23/20 0650 05/23/20 1152 05/23/20 1643 05/23/20 2054 05/24/20 0643  GLUCAP 91 91 89 95 83   D-Dimer: No results for input(s): DDIMER in the last 72 hours. Hgb A1c: No results for input(s): HGBA1C in the last 72 hours. Lipid Profile: No results for input(s): CHOL, HDL, LDLCALC, TRIG, CHOLHDL,  LDLDIRECT in the last 72 hours. Thyroid function studies: No results for input(s): TSH, T4TOTAL, T3FREE, THYROIDAB in the last 72 hours.  Invalid input(s): FREET3 Anemia work up: No results for input(s): VITAMINB12, FOLATE, FERRITIN, TIBC, IRON, RETICCTPCT in the last 72 hours. Sepsis Labs: Recent Labs  Lab 05/21/20 1241 05/22/20 0607 05/23/20 0840 05/24/20 0624  WBC 11.3* 9.2 9.6 9.3    Microbiology Recent Results (from the past 240 hour(s))  SARS Coronavirus 2 by RT PCR (hospital order, performed in [facility] hospital lab) Nasopharyngeal Nasopharyngeal Swab     Status: None   Collection Time: 05/14/20  6:29 PM   Specimen: Nasopharyngeal Swab  Result Value Ref Range Status   SARS Coronavirus 2 NEGATIVE NEGATIVE Final    Comment: (NOTE) SARS-CoV-2 target nucleic acids are NOT DETECTED.  The SARS-CoV-2 RNA is generally detectable in upper and lower respiratory specimens during the acute phase of infection. The lowest concentration of SARS-CoV-2 viral copies this assay can detect is 250 copies / mL. A negative result does not preclude SARS-CoV-2 infection and should not be used as the sole basis for treatment or other patient management decisions.  A negative result may occur with improper specimen collection / handling, submission of specimen other than nasopharyngeal swab, presence of viral mutation(s) within the areas targeted by this assay, and inadequate number of viral copies (<250 copies / mL). A negative result must be combined with clinical observations, patient history, and epidemiological information.  Fact Sheet for Patients:   StrictlyIdeas.no  Fact Sheet for Healthcare Providers: BankingDealers.co.za  This test is not yet approved or  cleared by the [place] FDA and has been authorized for detection and/or diagnosis of SARS-CoV-2 by FDA under an Emergency Use Authorization (EUA).  This EUA will remain in  effect (meaning this test can be used) for the duration of the COVID-19 declaration under Section 564(b)(1) of the Act, 21 U.S.C. section 360bbb-3(b)(1), unless the authorization is terminated or revoked sooner.  Performed at [facility], [place] [address] [place], [place] [address]   Urine culture     Status: Abnormal   Collection Time: 05/14/20  6:56 PM   Specimen: Urine, Random  Result Value Ref Range Status   Specimen Description URINE, RANDOM  Final   Special Requests   Final    NONE Performed at [facility], [place] [address], [place], [place] [address]    Culture >=100,000 COLONIES/mL ESCHERICHIA COLI (A)  Final   Report Status 05/16/2020 FINAL  Final   Organism ID, Bacteria ESCHERICHIA COLI (A)  Final      Susceptibility   Escherichia coli - MIC*    AMPICILLIN 8 SENSITIVE Sensitive     CEFAZOLIN <=4 SENSITIVE Sensitive     CEFTRIAXONE <=0.25 SENSITIVE Sensitive     CIPROFLOXACIN <=0.25 SENSITIVE Sensitive     GENTAMICIN <=1 SENSITIVE Sensitive     IMIPENEM <=0.25 SENSITIVE Sensitive     NITROFURANTOIN 32 SENSITIVE Sensitive     TRIMETH/SULFA <=20 SENSITIVE Sensitive     AMPICILLIN/SULBACTAM <=2 SENSITIVE Sensitive     PIP/TAZO <=4 SENSITIVE Sensitive     * >=100,000 COLONIES/mL ESCHERICHIA COLI  Culture, blood (routine x 2)     Status: None   Collection Time: 05/14/20 11:22 PM   Specimen: BLOOD  Result Value Ref Range Status   Specimen Description BLOOD RIGHT ARM  Final   Special Requests   Final    BOTTLES DRAWN AEROBIC AND ANAEROBIC Blood Culture adequate volume   Culture   Final    NO GROWTH 5 DAYS Performed at [facility], [place] [address] [place],  [address]    Report Status 05/20/2020 FINAL  Final  Culture, blood (routine x 2)     Status: None   Collection Time: 05/14/20 11:22 PM   Specimen: BLOOD  Result Value Ref Range Status   Specimen Description BLOOD LEFT ARM  Final   Special Requests   Final    BOTTLES DRAWN AEROBIC AND  ANAEROBIC Blood Culture adequate volume   Culture   Final    NO GROWTH 5 DAYS Performed at [facility], [place]  [address] [place], [place] [address]    Report Status 05/20/2020 FINAL  Final  MRSA PCR Screening     Status: None   Collection Time: 05/15/20  8:24 PM   Specimen: Nasopharyngeal  Result Value Ref Range Status   MRSA by PCR NEGATIVE NEGATIVE Final    Comment:        The GeneXpert MRSA Assay (FDA approved for NASAL specimens only), is one component of a comprehensive MRSA colonization surveillance program. It is not intended to diagnose MRSA infection nor to guide or monitor treatment for MRSA infections. Performed at [facility], [place] [address] [place], [place] [address]     Procedures and diagnostic studies:  No results found.  Medications:   . sodium chloride   Intravenous Once  . acidophilus  1 capsule Oral Daily  . atorvastatin  40 mg Oral Daily  . carvedilol  3.125 mg Oral BID WC  . citalopram  10 mg Oral Daily  . darbepoetin (ARANESP) injection - NON-DIALYSIS  40 mcg Subcutaneous Q Wed-1800  . donepezil  5 mg Oral QHS  . furosemide  40 mg Oral Daily  . insulin aspart  0-15 Units Subcutaneous TID AC & HS  . levothyroxine  125 mcg Oral Daily  . pantoprazole  40 mg Oral BID   Continuous Infusions:   LOS: 10 days   [facility]

## 2020-05-25 ENCOUNTER — Encounter (HOSPITAL_COMMUNITY): Payer: Self-pay | Admitting: Gastroenterology

## 2020-05-25 LAB — BASIC METABOLIC PANEL
Anion gap: 11 (ref 5–15)
BUN: 60 mg/dL — ABNORMAL HIGH (ref 8–23)
CO2: 23 mmol/L (ref 22–32)
Calcium: 7.5 mg/dL — ABNORMAL LOW (ref 8.9–10.3)
Chloride: 107 mmol/L (ref 98–111)
Creatinine, Ser: 1.46 mg/dL — ABNORMAL HIGH (ref 0.44–1.00)
GFR calc Af Amer: 38 mL/min — ABNORMAL LOW (ref >60)
GFR calc non Af Amer: 33 mL/min — ABNORMAL LOW (ref >60)
Glucose, Bld: 78 mg/dL (ref 70–99)
Potassium: 3.7 mmol/L (ref 3.5–5.1)
Sodium: 141 mmol/L (ref 135–145)

## 2020-05-25 LAB — GLUCOSE, CAPILLARY
Glucose-Capillary: 135 mg/dL — ABNORMAL HIGH (ref 70–99)
Glucose-Capillary: 102 mg/dL — ABNORMAL HIGH (ref 70–99)

## 2020-05-25 LAB — CBC
HCT: 29.2 % — ABNORMAL LOW (ref 36.0–46.0)
Hemoglobin: 9.2 g/dL — ABNORMAL LOW (ref 12.0–15.0)
MCH: 30.8 pg (ref 26.0–34.0)
MCHC: 31.5 g/dL (ref 30.0–36.0)
MCV: 97.7 fL (ref 80.0–100.0)
Platelets: 261 10*3/uL (ref 150–400)
RBC: 2.99 MIL/uL — ABNORMAL LOW (ref 3.87–5.11)
RDW: 15 % (ref 11.5–15.5)
WBC: 9 10*3/uL (ref 4.0–10.5)
nRBC: 0.3 % — ABNORMAL HIGH (ref 0.0–0.2)

## 2020-05-25 NOTE — TOC Transition Note (Signed)
Transition of Care [facility]) - CM/SW Discharge Note   Patient Details  Name: Connie [name] MRN: [id number] Date of Birth: March 01, 1934  Transition of Care [facility]) CM/SW Contact:  Emeterio Reeve, [place] Phone Number: 05/25/2020, 1:21 PM   Clinical Narrative:    Pt will discharge back to [facility] via son [name] in Private care. CSW left voice message to [place].   FL2 and discharge summary have been faxed to facility.   Nurse to call report to [phone]   Final next level of care: Assisted Living Barriers to Discharge: Barriers Resolved   Patient Goals and CMS Choice   CMS Medicare.gov Compare Post Acute Care list provided to:: Patient Represenative (must comment) [name], son) Choice offered to / list presented to : Adult Children  Discharge Placement              Patient chooses bed at: Other - please specify in the comment section below: [facility]) Patient to be transferred to facility by: Son [name], private care Name of family member notified: Pandora Leiter, [name] Patient and family notified of of transfer: 05/25/20  Discharge Plan and Services In-house Referral: Clinical Social Work                                   Social Determinants of Health (SDOH) Interventions     Readmission Risk Interventions No flowsheet data found.  Emeterio Reeve, Latanya Presser, [place] Social Worker [phone]

## 2020-05-25 NOTE — Plan of Care (Signed)
All goals met. 

## 2020-05-25 NOTE — Discharge Summary (Signed)
Physician Discharge Summary  Connie [name] HQI:[id number] DOB: 1934/09/01 DOA: 05/14/2020  PCP: [name], MD  Admit date: 05/14/2020 Discharge date: 05/25/2020  Time spent: 35 minutes  Recommendations for Outpatient Follow-up:  1. PCP Dr. Redmond School in 1 week, please discuss goals of care including CODE STATUS at follow-up, please check BMP in 1 week 2. Gastroenterology Dr. Benson Norway in 1 month   Discharge Diagnoses:  Principal Problem:   Acute kidney injury ([place])   [place] memory loss Chronic iron deficiency anemia Upper GI bleed Duodenal AVMs   Hypothyroidism   Hyperlipidemia associated with type 2 diabetes mellitus ([place])   Controlled type 2 diabetes mellitus without complication, without long-term current use of insulin ([name])   Metabolic acidosis   Chronic combined systolic and diastolic congestive heart failure ([name])   GERD without esophagitis   Complicated UTI (urinary tract infection)   Bradycardia   Acute metabolic encephalopathy   Hyperkalemia   Melena   Discharge Condition: Stable  Diet recommendation: Diabetic, low-sodium, heart healthy  Filed Weights   05/21/20 0511 05/24/20 0529 05/25/20 0508  Weight: 56.7 kg 56.7 kg 56.7 kg    History of present illness:  Connie [name] is an 86 y.o. female with past medical history of systolic and diastolic congestive heart failure(EF 55-60% 2018),hypothyroidism, diabetes mellitus type 2,GI bleeding (2016 and 07/2018)with chronic iron deficiency anemia, hyperlipidemia, hypertension who presents to [facility] emergency department due to change in mental status.   Found to have AKI and UTI.  Hospital Course:   Acute kidney injury -Creatinine up from baseline of 1.4 up to 4.3 on admission -Secondary to prerenal azotemia worsened by infection, ongoing diuretic use and Entresto -Renal ultrasound without hydronephrosis -Kidney function improved with hydration, creatinine down to 1.7, oral Lasix  resumed -Restarting Entresto at discharge, Aldactone still on hold, monitor volume status at follow-up and resume Aldactone if kidney function remains stable, please check BMP in 1 week  Acute on chronic anemia  Multiple duodenal AVMs -history of known chronic iron deficiency anemia from GI blood loss -Multiple hospitalizations for same, last endoscopic work-up in 07/2018 revealed gastritis and nonbleeding colonic angiodysplastic lesions on colonoscopy. -Transfused 3 units of PRBC this admission, hemoglobin up to 9.4 this morning -Anemia panel last week without overt iron deficiency -Gastroenterology Dr. Benson Norway consulted, underwent endoscopy yesterday which noted multipleDuodenal AVMs which were treated, hemoglobin improved -Resume periodic iron infusions after discharge -CBC in a.m.  Acute metabolic encephalopathy-  -Worsened in the setting of renal failure, UTI -Improving, now back to baseline  Mild dementia/memory loss -Stable now  Hyperkalemia -resolved    Finger pain due to gout/inflammatory arthritis -xray negative for fracture, ortho consult appreciated -improved, tapered off prednisone   Hypothyroidism -Continue home regimen of Synthroid  Chronic combined systolic and diastolic congestive heart failure ([name]) -Last echo with improvement in EF to 13%, grade 1 diastolic dysfunction -Continue Coreg, diuretics and Entresto held on admission due to severe renal insufficiency  -Subsequently Lasix resumed, Entresto resumed at discharge -Aldactone on hold, restart this in 1 week if stable  Dementia -Continue Aricept   Discharge Exam: Vitals:   05/25/20 0506 05/25/20 0508  BP: (!) 184/44 (!) 174/48  Pulse: 60   Resp: 18   Temp: 98.5 F (36.9 C)   SpO2: 97%     General: Awake alert, oriented to self and place, memory deficits noted Cardiovascular: S1-S2, regular rate rhythm Respiratory: Clear  Discharge Instructions   Discharge Instructions     Diet -  low sodium heart healthy   Complete by: As directed    Increase activity slowly   Complete by: As directed      Allergies as of 05/25/2020   No Known Allergies     Medication List    STOP taking these medications   Accu-Chek FastClix Lancet Kit   Accu-Chek Guide w/Device Kit   glucose blood test strip Commonly known as: Accu-Chek Guide   ondansetron 4 MG disintegrating tablet Commonly known as: Zofran ODT   spironolactone 25 MG tablet Commonly known as: ALDACTONE     TAKE these medications   ALPRAZolam 0.25 MG tablet Commonly known as: XANAX Take 1 tablet (0.25 mg total) by mouth 2 (two) times daily as needed for anxiety.   amLODipine 10 MG tablet Commonly known as: NORVASC TAKE 1 TABLET EVERY DAY   atorvastatin 40 MG tablet Commonly known as: LIPITOR TAKE 1 TABLET EVERY DAY   B-complex with vitamin C tablet Take 1 tablet by mouth daily.   CALCIUM 1000 + D PO Take 1 tablet by mouth.   carvedilol 12.5 MG tablet Commonly known as: COREG TAKE 1 TABLET BY MOUTH 2 TIMES DAILY WITH A MEAL. What changed: See the new instructions.   citalopram 10 MG tablet Commonly known as: CeleXA Take 1 tablet (10 mg total) by mouth daily.   donepezil 5 MG tablet Commonly known as: ARICEPT TAKE 1 TABLET BY MOUTH EVERYDAY AT BEDTIME What changed:   how much to take  how to take this  when to take this  additional instructions   Entresto 24-26 MG Generic drug: sacubitril-valsartan TAKE 1 TABLET TWICE DAILY   furosemide 40 MG tablet Commonly known as: LASIX TAKE 1 TABLET EVERY DAY What changed: when to take this   levothyroxine 125 MCG tablet Commonly known as: SYNTHROID TAKE 1 TABLET EVERY DAY   pantoprazole 20 MG tablet Commonly known as: PROTONIX TAKE 1 TABLET EVERY DAY   pioglitazone 30 MG tablet Commonly known as: ACTOS TAKE 1 TABLET EVERY DAY   potassium chloride 10 MEQ tablet Commonly known as: KLOR-CON TAKE 1 TABLET TWICE DAILY   vitamin  B-12 1000 MCG tablet Commonly known as: CYANOCOBALAMIN Take 1,000 mcg by mouth daily.      No Known Allergies  Follow-up Information    [name], MD. Schedule an appointment as soon as possible for a visit in 1 week(s).   Specialty: Family Medicine Contact information: [place] [address] [phone]        Carol Ada, MD. Schedule an appointment as soon as possible for a visit in 1 month(s).   Specialty: Gastroenterology Contact information: [address] [place] [address] [phone]                [name] (including imaging, microbiology, ancillary and laboratory) are listed below for reference.    Significant Diagnostic Studies: CT Head Wo Contrast  Result Date: 05/14/2020 CLINICAL DATA:  Mental status change EXAM: CT HEAD WITHOUT CONTRAST TECHNIQUE: Contiguous axial images were obtained from the base of the skull through the vertex without intravenous contrast. COMPARISON:  None. FINDINGS: Brain: No evidence of acute infarction, hemorrhage, hydrocephalus, extra-axial collection or mass lesion/mass effect.Mild atrophic changes and chronic white matter ischemic changes are noted. Vascular: No acute abnormality noted. Skull: Normal. Negative for fracture or focal lesion. Sinuses/Orbits: No acute finding. Other: None. Electronically Signed   By: Inez Catalina M.D.   On: 05/14/2020 20:36  US RENAL  Result Date: 05/14/2020 CLINICAL DATA:  Initial evaluation for acute renal failure. EXAM: RENAL / URINARY TRACT ULTRASOUND COMPLETE COMPARISON:  Prior CT from 10/07/2015. FINDINGS: Right Kidney: Renal measurements: 9.8 x 5.2 x 4.0 cm = volume: 105.4 mL. Mildly increased echogenicity within the renal parenchyma. No nephrolithiasis or hydronephrosis. 3.6 x 3.1 x 3.2 cm simple cyst present at the lower pole. Left Kidney: Renal measurements: 9.6 x 4.4 x 3.9 cm = volume:  85.5 mL. Mildly increased echogenicity within the renal parenchyma. No nephrolithiasis or hydronephrosis. No focal renal mass. Bladder: Appears normal for degree of bladder distention. Other: Incidental note made of a shadowing stone within the gallbladder lumen. IMPRESSION: 1. Mildly increased echogenicity within the renal parenchyma, suggesting medical renal disease. 2. No hydronephrosis. 3. 3.6 cm simple cyst at the lower pole of the right kidney. 4. Cholelithiasis. Electronically Signed   By: Jeannine Boga M.D.   On: 05/14/2020 23:07   DG Chest Portable 1 View  Result Date: 05/14/2020 CLINICAL DATA:  Weakness EXAM: PORTABLE CHEST 1 VIEW COMPARISON:  09/21/2018 FINDINGS: Cardiomegaly. Aortic atherosclerosis. Lungs clear. No effusions or edema. No acute bony abnormality. Degenerative changes in the shoulders. IMPRESSION: Cardiomegaly.  No active disease. Electronically Signed   By: Rolm Baptise M.D.   On: 05/14/2020 18:05   DG Finger Middle Left  Result Date: 05/18/2020 CLINICAL DATA:  Pt reports pain in left middle finger. During exam pt replaced the ring on her finger after initial removal. EXAM: LEFT MIDDLE FINGER 2+V COMPARISON:  None. FINDINGS: Osteopenia. There is no evidence of fracture or dislocation. Moderate degenerative changes. Soft tissues are unremarkable. IMPRESSION: No acute osseous abnormality in the left middle finger. Electronically Signed   By: Audie Pinto M.D.   On: 05/18/2020 14:03    Microbiology: Recent Results (from the past 240 hour(s))  MRSA PCR Screening     Status: None   Collection Time: 05/15/20  8:24 PM   Specimen: Nasopharyngeal  Result Value Ref Range Status   MRSA by PCR NEGATIVE NEGATIVE Final    Comment:        The GeneXpert MRSA Assay (FDA approved for NASAL specimens only), is one component of a comprehensive MRSA colonization surveillance program. It is not intended to diagnose MRSA infection nor to guide or monitor treatment for MRSA  infections. Performed at [facility], [place] [address] [place], [place] [address]      Labs: Basic Metabolic Panel: Recent Labs  Lab 05/21/20 0101 05/22/20 0607 05/23/20 0840 05/24/20 0624 05/25/20 0234  NA 135 137 139 141 141  K 4.3 4.1 4.1 3.7 3.7  CL 105 107 109 108 107  CO2 18* 21* 20* 22 23  GLUCOSE 100* 122* 95 88 78  BUN 62* 79* 78* 62* 60*  CREATININE 1.77* 1.91* 1.74* 1.46* 1.46*  CALCIUM 6.9* 6.9* 7.0* 7.4* 7.5*   Liver Function Tests: No results for input(s): AST, ALT, ALKPHOS, BILITOT, PROT, ALBUMIN in the last 168 hours. No results for input(s): LIPASE, AMYLASE in the last 168 hours. No results for input(s): AMMONIA in the last 168 hours. CBC: Recent Labs  Lab 05/21/20 1241 05/22/20 0607 05/23/20 0840 05/24/20 0624 05/25/20 0234  WBC 11.3* 9.2 9.6 9.3 9.0  HGB 7.4* 6.5* 9.4* 10.0* 9.2*  HCT 23.9* 21.0* 29.3* 31.8* 29.2*  MCV 96.8 98.1 95.8 96.7 97.7  PLT 209 235 221 268 261   Cardiac Enzymes: No results for input(s): CKTOTAL, CKMB, CKMBINDEX, TROPONINI in the last 168  hours. BNP: BNP (last 3 results) Recent Labs    07/25/19 1537  BNP 393.5*    ProBNP (last 3 results) No results for input(s): PROBNP in the last 8760 hours.  CBG: Recent Labs  Lab 05/24/20 0643 05/24/20 1144 05/24/20 1658 05/24/20 2109 05/25/20 0644  GLUCAP 83 211* 101* 190* 135*   Signed:  Domenic Polite MD.  [facility] 05/25/2020, 10:31 AM

## 2020-05-25 NOTE — NC FL2 (Signed)
[place] LEVEL OF CARE SCREENING TOOL     IDENTIFICATION  Patient Name: Connie [name] Birthdate: Nov 27, 1933 Sex: female Admission Date (Current Location): 05/14/2020  [facility] and [place] Number:  Herbalist and Address:  [facility] [place], [place] [address] [place], [place] [address]      Provider Number: [id number]  Attending Physician Name and Address:  Domenic Polite, MD  Relative Name and Phone Number:  Pershing Cox    Current Level of Care: Hospital Recommended Level of Care: [place] Prior Approval Number:    Date Approved/Denied:   PASRR Number:    Discharge Plan: Other (Comment) (ALF)    Current Diagnoses: Patient Active Problem List   Diagnosis Date Noted   Melena    Acute kidney injury ([place]) 53/61/4431   Complicated UTI (urinary tract infection) 05/14/2020   Bradycardia 54/00/8676   Acute metabolic encephalopathy 19/50/9326   Hyperkalemia 05/14/2020   Edema 07/25/2019   Hypocalcemia    Anemia    GERD without esophagitis 06/14/2018   Osteoporosis 05/25/2018   History of cataract extraction 10/19/2017   Aortic atherosclerosis ([place]) 10/19/2016   Long-term use of high-risk medication 08/04/2016   Coronary artery disease involving native heart without angina pectoris 08/03/2016   NICM (nonischemic cardiomyopathy) ([place]) 08/03/2016   Senile purpura ([place]) 02/02/2016   Macular degeneration (senile) of retina 12/23/2015   Chronic combined systolic and diastolic congestive heart failure ([place]) 71/24/5809   Metabolic acidosis 98/33/8250   Angiodysplasia of stomach and duodenum with hemorrhage 06/24/2015   Iron deficiency anemia due to chronic blood loss 06/24/2015   Controlled type 2 diabetes mellitus without complication, without long-term current use of insulin ([name])    Obesity (BMI 30.0-34.9) 07/25/2014   Hypertension associated with diabetes ([place]) 03/15/2012    Hypothyroidism 03/15/2012   Hyperlipidemia associated with type 2 diabetes mellitus ([place]) 03/15/2012    Orientation RESPIRATION BLADDER Height & Weight     Self, Place  Normal Continent, External catheter Weight: 125 lb (56.7 kg) Height:  4' 11.02" (149.9 cm)  BEHAVIORAL SYMPTOMS/MOOD NEUROLOGICAL BOWEL NUTRITION STATUS      Continent Diet (See discharge summary)  AMBULATORY STATUS COMMUNICATION OF NEEDS Skin   Supervision Verbally Normal                       Personal Care Assistance Level of Assistance  Bathing, Feeding, Dressing Bathing Assistance: Limited assistance Feeding assistance: Independent Dressing Assistance: Limited assistance     Functional Limitations Info  Sight, Hearing, Speech Sight Info: Impaired Hearing Info: Adequate Speech Info: Adequate    SPECIAL CARE FACTORS FREQUENCY  PT (By licensed PT), OT (By licensed OT)     PT Frequency: 3x a week OT Frequency: 3x a week            Contractures Contractures Info: Not present    Additional Factors Info  Code Status, Allergies Code Status Info: Full Allergies Info: NKA           Current Medications (05/25/2020):  This is the current hospital active medication list Current Facility-Administered Medications  Medication Dose Route Frequency Provider Last Rate Last Admin   0.9 %  sodium chloride infusion (Manually program via Guardrails IV Fluids)   Intravenous Once Carol Ada, MD       acetaminophen (TYLENOL) tablet 650 mg  650 mg Oral Q6H PRN Carol Ada, MD   650 mg at 05/18/20 1600   Or  acetaminophen (TYLENOL) suppository 650 mg  650 mg Rectal Q6H PRN Carol Ada, MD       acidophilus (RISAQUAD) capsule 1 capsule  1 capsule Oral Daily Carol Ada, MD   1 capsule at 05/25/20 0916   atorvastatin (LIPITOR) tablet 40 mg  40 mg Oral Daily Carol Ada, MD   40 mg at 05/25/20 0916   carvedilol (COREG) tablet 3.125 mg  3.125 mg Oral BID WC Carol Ada, MD   3.125 mg at 05/25/20  0809   citalopram (CELEXA) tablet 10 mg  10 mg Oral Daily Carol Ada, MD   10 mg at 05/25/20 8144   Darbepoetin Alfa (ARANESP) injection 40 mcg  40 mcg Subcutaneous Q Wed-1800 Carol Ada, MD   40 mcg at 05/21/20 1752   donepezil (ARICEPT) tablet 5 mg  5 mg Oral QHS Carol Ada, MD   5 mg at 05/24/20 2140   furosemide (LASIX) tablet 40 mg  40 mg Oral Daily Domenic Polite, MD   40 mg at 05/25/20 0916   hydrALAZINE (APRESOLINE) injection 10 mg  10 mg Intravenous Q6H PRN Domenic Polite, MD       insulin aspart (novoLOG) injection 0-15 Units  0-15 Units Subcutaneous TID AC & HS Carol Ada, MD   2 Units at 05/25/20 8185   levothyroxine (SYNTHROID) tablet 125 mcg  125 mcg Oral Daily Carol Ada, MD   125 mcg at 05/25/20 0539   ondansetron (ZOFRAN) tablet 4 mg  4 mg Oral Q6H PRN Carol Ada, MD   4 mg at 05/17/20 1834   Or   ondansetron (ZOFRAN) injection 4 mg  4 mg Intravenous Q6H PRN Carol Ada, MD       pantoprazole (PROTONIX) EC tablet 40 mg  40 mg Oral BID Carol Ada, MD   40 mg at 05/25/20 0919     Discharge Medications: ALPRAZolam 0.25 MG tablet Commonly known as: XANAX Take 1 tablet (0.25 mg total) by mouth 2 (two) times daily as needed for anxiety.   amLODipine 10 MG tablet Commonly known as: NORVASC TAKE 1 TABLET EVERY DAY   atorvastatin 40 MG tablet Commonly known as: LIPITOR TAKE 1 TABLET EVERY DAY   B-complex with vitamin C tablet Take 1 tablet by mouth daily.   CALCIUM 1000 + D PO Take 1 tablet by mouth.   carvedilol 12.5 MG tablet Commonly known as: COREG TAKE 1 TABLET BY MOUTH 2 TIMES DAILY WITH A MEAL. What changed: See the new instructions.   citalopram 10 MG tablet Commonly known as: CeleXA Take 1 tablet (10 mg total) by mouth daily.   donepezil 5 MG tablet Commonly known as: ARICEPT TAKE 1 TABLET BY MOUTH EVERYDAY AT BEDTIME What changed:   how much to take  how to take this  when to take this  additional  instructions   Entresto 24-26 MG Generic drug: sacubitril-valsartan TAKE 1 TABLET TWICE DAILY   furosemide 40 MG tablet Commonly known as: LASIX TAKE 1 TABLET EVERY DAY What changed: when to take this   levothyroxine 125 MCG tablet Commonly known as: SYNTHROID TAKE 1 TABLET EVERY DAY   pantoprazole 20 MG tablet Commonly known as: PROTONIX TAKE 1 TABLET EVERY DAY   pioglitazone 30 MG tablet Commonly known as: ACTOS TAKE 1 TABLET EVERY DAY   potassium chloride 10 MEQ tablet Commonly known as: KLOR-CON TAKE 1 TABLET TWICE DAILY   vitamin B-12 1000 MCG tablet Commonly known as: CYANOCOBALAMIN Take 1,000 mcg by mouth daily.    Relevant  Imaging Results:  Relevant Lab Results:   Additional Information SSN [id number]  Emeterio Reeve, [place]

## 2020-05-25 NOTE — Progress Notes (Signed)
[name] Discussed prescriptions and follow up appointments with the patient and her son. Prescriptions given to patient; medication list explained in detail. Patient and her son verbalized understanding.  Skin clean, dry and intact without evidence of skin break down, no evidence of skin tears noted. IV catheter discontinued intact. Site without signs and symptoms of complications. Dressing and pressure applied. Pt denies pain at the site currently. No complaints noted.  Patient free of lines, drains, and wounds.   An After Visit Summary (AVS) was printed and given to the patient.Patient's son will bring her mother to [facility]. Patient escorted via wheelchair, and discharged home via private auto.  [place], Zenon Mayo, RN

## 2020-05-27 DIAGNOSIS — F331 Major depressive disorder, recurrent, moderate: Secondary | ICD-10-CM | POA: Diagnosis not present

## 2020-05-27 DIAGNOSIS — I5042 Chronic combined systolic (congestive) and diastolic (congestive) heart failure: Secondary | ICD-10-CM | POA: Diagnosis not present

## 2020-05-27 DIAGNOSIS — E039 Hypothyroidism, unspecified: Secondary | ICD-10-CM | POA: Diagnosis not present

## 2020-05-27 DIAGNOSIS — I130 Hypertensive heart and chronic kidney disease with heart failure and stage 1 through stage 4 chronic kidney disease, or unspecified chronic kidney disease: Secondary | ICD-10-CM | POA: Diagnosis not present

## 2020-05-27 DIAGNOSIS — N1832 Chronic kidney disease, stage 3b: Secondary | ICD-10-CM | POA: Diagnosis not present

## 2020-05-27 DIAGNOSIS — K219 Gastro-esophageal reflux disease without esophagitis: Secondary | ICD-10-CM | POA: Diagnosis not present

## 2020-05-27 NOTE — Anesthesia Postprocedure Evaluation (Signed)
Anesthesia Post Note  Patient: Kessie Croston Wiemann  Procedure(s) Performed: ENTEROSCOPY (N/A ) HOT HEMOSTASIS (ARGON PLASMA COAGULATION/BICAP) (N/A ) HEMOSTASIS CLIP PLACEMENT     Patient location during evaluation: Endoscopy Anesthesia Type: MAC Level of consciousness: awake and alert Pain management: pain level controlled Vital Signs Assessment: post-procedure vital signs reviewed and stable Respiratory status: spontaneous breathing, nonlabored ventilation, respiratory function stable and patient connected to nasal cannula oxygen Cardiovascular status: stable and blood pressure returned to baseline Postop Assessment: no apparent nausea or vomiting Anesthetic complications: no   No complications documented.  Last Vitals:  Vitals:   05/25/20 0506 05/25/20 0508  BP: (!) 184/44 (!) 174/48  Pulse: 60   Resp: 18   Temp: 36.9 C   SpO2: 97%     Last Pain:  Vitals:   05/25/20 0506  TempSrc: Oral  PainSc:                  Emmy Keng

## 2020-05-29 DIAGNOSIS — E559 Vitamin D deficiency, unspecified: Secondary | ICD-10-CM | POA: Diagnosis not present

## 2020-05-29 DIAGNOSIS — E119 Type 2 diabetes mellitus without complications: Secondary | ICD-10-CM | POA: Diagnosis not present

## 2020-05-29 DIAGNOSIS — E7849 Other hyperlipidemia: Secondary | ICD-10-CM | POA: Diagnosis not present

## 2020-05-29 DIAGNOSIS — Z1152 Encounter for screening for COVID-19: Secondary | ICD-10-CM | POA: Diagnosis not present

## 2020-05-29 DIAGNOSIS — E038 Other specified hypothyroidism: Secondary | ICD-10-CM | POA: Diagnosis not present

## 2020-05-29 DIAGNOSIS — D518 Other vitamin B12 deficiency anemias: Secondary | ICD-10-CM | POA: Diagnosis not present

## 2020-05-29 DIAGNOSIS — Z79899 Other long term (current) drug therapy: Secondary | ICD-10-CM | POA: Diagnosis not present

## 2020-06-02 DIAGNOSIS — Z1152 Encounter for screening for COVID-19: Secondary | ICD-10-CM | POA: Diagnosis not present

## 2020-06-09 DIAGNOSIS — Z1152 Encounter for screening for COVID-19: Secondary | ICD-10-CM | POA: Diagnosis not present

## 2020-06-11 ENCOUNTER — Telehealth: Payer: Self-pay | Admitting: Family Medicine

## 2020-06-11 NOTE — Telephone Encounter (Signed)
Pt's son, [name], was called and informed that pt is due for her next Prolia injection in Oct. Due to pt now living in a care facility, he will inform them and see how to proceed.

## 2020-06-12 DIAGNOSIS — R519 Headache, unspecified: Secondary | ICD-10-CM | POA: Diagnosis not present

## 2020-06-12 DIAGNOSIS — R5381 Other malaise: Secondary | ICD-10-CM | POA: Diagnosis not present

## 2020-06-12 DIAGNOSIS — R58 Hemorrhage, not elsewhere classified: Secondary | ICD-10-CM | POA: Diagnosis not present

## 2020-06-12 DIAGNOSIS — R52 Pain, unspecified: Secondary | ICD-10-CM | POA: Diagnosis not present

## 2020-06-12 DIAGNOSIS — M7989 Other specified soft tissue disorders: Secondary | ICD-10-CM | POA: Diagnosis not present

## 2020-06-12 DIAGNOSIS — M542 Cervicalgia: Secondary | ICD-10-CM | POA: Diagnosis not present

## 2020-06-12 DIAGNOSIS — I10 Essential (primary) hypertension: Secondary | ICD-10-CM | POA: Diagnosis not present

## 2020-06-12 DIAGNOSIS — S81812A Laceration without foreign body, left lower leg, initial encounter: Secondary | ICD-10-CM | POA: Diagnosis not present

## 2020-06-12 DIAGNOSIS — W19XXXA Unspecified fall, initial encounter: Secondary | ICD-10-CM | POA: Diagnosis not present

## 2020-06-12 DIAGNOSIS — M79662 Pain in left lower leg: Secondary | ICD-10-CM | POA: Diagnosis not present

## 2020-06-12 DIAGNOSIS — Z23 Encounter for immunization: Secondary | ICD-10-CM | POA: Diagnosis not present

## 2020-06-12 DIAGNOSIS — S00531A Contusion of lip, initial encounter: Secondary | ICD-10-CM | POA: Diagnosis not present

## 2020-06-12 DIAGNOSIS — S8990XA Unspecified injury of unspecified lower leg, initial encounter: Secondary | ICD-10-CM | POA: Diagnosis not present

## 2020-06-16 DIAGNOSIS — Z1152 Encounter for screening for COVID-19: Secondary | ICD-10-CM | POA: Diagnosis not present

## 2020-06-17 DIAGNOSIS — S81812D Laceration without foreign body, left lower leg, subsequent encounter: Secondary | ICD-10-CM | POA: Diagnosis not present

## 2020-06-17 DIAGNOSIS — S00531D Contusion of lip, subsequent encounter: Secondary | ICD-10-CM | POA: Diagnosis not present

## 2020-06-17 DIAGNOSIS — R296 Repeated falls: Secondary | ICD-10-CM | POA: Diagnosis not present

## 2020-06-18 DIAGNOSIS — D518 Other vitamin B12 deficiency anemias: Secondary | ICD-10-CM | POA: Diagnosis not present

## 2020-06-18 DIAGNOSIS — E119 Type 2 diabetes mellitus without complications: Secondary | ICD-10-CM | POA: Diagnosis not present

## 2020-06-18 DIAGNOSIS — E7849 Other hyperlipidemia: Secondary | ICD-10-CM | POA: Diagnosis not present

## 2020-06-18 DIAGNOSIS — Z79899 Other long term (current) drug therapy: Secondary | ICD-10-CM | POA: Diagnosis not present

## 2020-06-23 ENCOUNTER — Telehealth: Payer: Self-pay | Admitting: Family Medicine

## 2020-06-23 DIAGNOSIS — E1122 Type 2 diabetes mellitus with diabetic chronic kidney disease: Secondary | ICD-10-CM | POA: Diagnosis not present

## 2020-06-23 DIAGNOSIS — I700 Atherosclerosis of aorta: Secondary | ICD-10-CM | POA: Diagnosis not present

## 2020-06-23 DIAGNOSIS — K219 Gastro-esophageal reflux disease without esophagitis: Secondary | ICD-10-CM | POA: Diagnosis not present

## 2020-06-23 DIAGNOSIS — K2970 Gastritis, unspecified, without bleeding: Secondary | ICD-10-CM | POA: Diagnosis not present

## 2020-06-23 DIAGNOSIS — Z1152 Encounter for screening for COVID-19: Secondary | ICD-10-CM | POA: Diagnosis not present

## 2020-06-23 DIAGNOSIS — N1832 Chronic kidney disease, stage 3b: Secondary | ICD-10-CM | POA: Diagnosis not present

## 2020-06-23 DIAGNOSIS — I5043 Acute on chronic combined systolic (congestive) and diastolic (congestive) heart failure: Secondary | ICD-10-CM | POA: Diagnosis not present

## 2020-06-23 DIAGNOSIS — S81812D Laceration without foreign body, left lower leg, subsequent encounter: Secondary | ICD-10-CM | POA: Diagnosis not present

## 2020-06-23 DIAGNOSIS — M810 Age-related osteoporosis without current pathological fracture: Secondary | ICD-10-CM | POA: Diagnosis not present

## 2020-06-23 DIAGNOSIS — I130 Hypertensive heart and chronic kidney disease with heart failure and stage 1 through stage 4 chronic kidney disease, or unspecified chronic kidney disease: Secondary | ICD-10-CM | POA: Diagnosis not present

## 2020-06-23 NOTE — Telephone Encounter (Signed)
Message should relay that pt WILL BE RECEIVING Prolia at assisted living facility. Previous message was typo

## 2020-06-23 NOTE — Telephone Encounter (Signed)
Pt's son was called concerning Prolia. Pt is due to have in October. Per [name] pt will not be receiving thru assisted living facility. We will not longer be handling for her.

## 2020-06-24 DIAGNOSIS — E1122 Type 2 diabetes mellitus with diabetic chronic kidney disease: Secondary | ICD-10-CM | POA: Diagnosis not present

## 2020-06-24 DIAGNOSIS — I130 Hypertensive heart and chronic kidney disease with heart failure and stage 1 through stage 4 chronic kidney disease, or unspecified chronic kidney disease: Secondary | ICD-10-CM | POA: Diagnosis not present

## 2020-06-24 DIAGNOSIS — K219 Gastro-esophageal reflux disease without esophagitis: Secondary | ICD-10-CM | POA: Diagnosis not present

## 2020-06-24 DIAGNOSIS — I700 Atherosclerosis of aorta: Secondary | ICD-10-CM | POA: Diagnosis not present

## 2020-06-24 DIAGNOSIS — M810 Age-related osteoporosis without current pathological fracture: Secondary | ICD-10-CM | POA: Diagnosis not present

## 2020-06-24 DIAGNOSIS — K2970 Gastritis, unspecified, without bleeding: Secondary | ICD-10-CM | POA: Diagnosis not present

## 2020-06-24 DIAGNOSIS — S81812D Laceration without foreign body, left lower leg, subsequent encounter: Secondary | ICD-10-CM | POA: Diagnosis not present

## 2020-06-24 DIAGNOSIS — I5042 Chronic combined systolic (congestive) and diastolic (congestive) heart failure: Secondary | ICD-10-CM | POA: Diagnosis not present

## 2020-06-24 DIAGNOSIS — N1832 Chronic kidney disease, stage 3b: Secondary | ICD-10-CM | POA: Diagnosis not present

## 2020-06-24 DIAGNOSIS — I5043 Acute on chronic combined systolic (congestive) and diastolic (congestive) heart failure: Secondary | ICD-10-CM | POA: Diagnosis not present

## 2020-06-25 ENCOUNTER — Encounter: Payer: Self-pay | Admitting: Pharmacist

## 2020-06-25 ENCOUNTER — Other Ambulatory Visit: Payer: Self-pay | Admitting: Hematology and Oncology

## 2020-06-25 DIAGNOSIS — D649 Anemia, unspecified: Secondary | ICD-10-CM | POA: Insufficient documentation

## 2020-06-25 DIAGNOSIS — D631 Anemia in chronic kidney disease: Secondary | ICD-10-CM | POA: Diagnosis not present

## 2020-06-25 DIAGNOSIS — N189 Chronic kidney disease, unspecified: Secondary | ICD-10-CM | POA: Diagnosis not present

## 2020-06-25 LAB — BASIC METABOLIC PANEL
BUN: 42 — AB (ref 4–21)
CO2: 21 (ref 13–22)
Chloride: 106 (ref 99–108)
Creatinine: 1.7 — AB (ref 0.5–1.1)
Glucose: 176
Potassium: 4.6 (ref 3.4–5.3)
Sodium: 138 (ref 137–147)

## 2020-06-25 LAB — CBC AND DIFFERENTIAL
HCT: 27 — AB (ref 36–46)
Hemoglobin: 8.6 — AB (ref 12.0–16.0)
Platelets: 325 (ref 150–399)
WBC: 15.7

## 2020-06-25 LAB — IRON,TIBC AND FERRITIN PANEL
%SAT: 14.4
Ferritin: 759
Iron: 28
TIBC: 194

## 2020-06-25 LAB — VITAMIN B12: Vitamin B-12: 878

## 2020-06-25 LAB — CBC: RBC: 2.94 — AB (ref 3.87–5.11)

## 2020-06-25 LAB — COMPREHENSIVE METABOLIC PANEL: Calcium: 8.5 — AB (ref 8.7–10.7)

## 2020-06-27 DIAGNOSIS — K2970 Gastritis, unspecified, without bleeding: Secondary | ICD-10-CM | POA: Diagnosis not present

## 2020-06-27 DIAGNOSIS — K219 Gastro-esophageal reflux disease without esophagitis: Secondary | ICD-10-CM | POA: Diagnosis not present

## 2020-06-27 DIAGNOSIS — I130 Hypertensive heart and chronic kidney disease with heart failure and stage 1 through stage 4 chronic kidney disease, or unspecified chronic kidney disease: Secondary | ICD-10-CM | POA: Diagnosis not present

## 2020-06-27 DIAGNOSIS — N1832 Chronic kidney disease, stage 3b: Secondary | ICD-10-CM | POA: Diagnosis not present

## 2020-06-27 DIAGNOSIS — E1122 Type 2 diabetes mellitus with diabetic chronic kidney disease: Secondary | ICD-10-CM | POA: Diagnosis not present

## 2020-06-27 DIAGNOSIS — I700 Atherosclerosis of aorta: Secondary | ICD-10-CM | POA: Diagnosis not present

## 2020-06-27 DIAGNOSIS — I5043 Acute on chronic combined systolic (congestive) and diastolic (congestive) heart failure: Secondary | ICD-10-CM | POA: Diagnosis not present

## 2020-06-27 DIAGNOSIS — S81812D Laceration without foreign body, left lower leg, subsequent encounter: Secondary | ICD-10-CM | POA: Diagnosis not present

## 2020-06-27 DIAGNOSIS — M810 Age-related osteoporosis without current pathological fracture: Secondary | ICD-10-CM | POA: Diagnosis not present

## 2020-06-30 DIAGNOSIS — J690 Pneumonitis due to inhalation of food and vomit: Secondary | ICD-10-CM | POA: Diagnosis not present

## 2020-06-30 DIAGNOSIS — N179 Acute kidney failure, unspecified: Secondary | ICD-10-CM | POA: Diagnosis not present

## 2020-06-30 DIAGNOSIS — N183 Chronic kidney disease, stage 3 unspecified: Secondary | ICD-10-CM | POA: Diagnosis not present

## 2020-06-30 DIAGNOSIS — F0390 Unspecified dementia without behavioral disturbance: Secondary | ICD-10-CM | POA: Diagnosis not present

## 2020-06-30 DIAGNOSIS — M810 Age-related osteoporosis without current pathological fracture: Secondary | ICD-10-CM | POA: Diagnosis not present

## 2020-06-30 DIAGNOSIS — S81802A Unspecified open wound, left lower leg, initial encounter: Secondary | ICD-10-CM | POA: Diagnosis not present

## 2020-06-30 DIAGNOSIS — I4891 Unspecified atrial fibrillation: Secondary | ICD-10-CM | POA: Diagnosis not present

## 2020-06-30 DIAGNOSIS — R2241 Localized swelling, mass and lump, right lower limb: Secondary | ICD-10-CM | POA: Diagnosis not present

## 2020-06-30 DIAGNOSIS — J810 Acute pulmonary edema: Secondary | ICD-10-CM | POA: Diagnosis not present

## 2020-06-30 DIAGNOSIS — E1122 Type 2 diabetes mellitus with diabetic chronic kidney disease: Secondary | ICD-10-CM | POA: Diagnosis not present

## 2020-06-30 DIAGNOSIS — J9602 Acute respiratory failure with hypercapnia: Secondary | ICD-10-CM | POA: Diagnosis not present

## 2020-06-30 DIAGNOSIS — X58XXXA Exposure to other specified factors, initial encounter: Secondary | ICD-10-CM | POA: Diagnosis not present

## 2020-06-30 DIAGNOSIS — I10 Essential (primary) hypertension: Secondary | ICD-10-CM | POA: Diagnosis not present

## 2020-06-30 DIAGNOSIS — I959 Hypotension, unspecified: Secondary | ICD-10-CM | POA: Diagnosis not present

## 2020-06-30 DIAGNOSIS — E870 Hyperosmolality and hypernatremia: Secondary | ICD-10-CM | POA: Diagnosis not present

## 2020-06-30 DIAGNOSIS — A419 Sepsis, unspecified organism: Secondary | ICD-10-CM | POA: Diagnosis not present

## 2020-06-30 DIAGNOSIS — I509 Heart failure, unspecified: Secondary | ICD-10-CM | POA: Diagnosis not present

## 2020-06-30 DIAGNOSIS — T827XXA Infection and inflammatory reaction due to other cardiac and vascular devices, implants and grafts, initial encounter: Secondary | ICD-10-CM | POA: Diagnosis not present

## 2020-06-30 DIAGNOSIS — I517 Cardiomegaly: Secondary | ICD-10-CM | POA: Diagnosis not present

## 2020-06-30 DIAGNOSIS — Z7401 Bed confinement status: Secondary | ICD-10-CM | POA: Diagnosis not present

## 2020-06-30 DIAGNOSIS — I110 Hypertensive heart disease with heart failure: Secondary | ICD-10-CM | POA: Diagnosis not present

## 2020-06-30 DIAGNOSIS — J9601 Acute respiratory failure with hypoxia: Secondary | ICD-10-CM | POA: Diagnosis not present

## 2020-06-30 DIAGNOSIS — Z452 Encounter for adjustment and management of vascular access device: Secondary | ICD-10-CM | POA: Diagnosis not present

## 2020-06-30 DIAGNOSIS — R000 Tachycardia, unspecified: Secondary | ICD-10-CM | POA: Diagnosis not present

## 2020-06-30 DIAGNOSIS — R0902 Hypoxemia: Secondary | ICD-10-CM | POA: Diagnosis not present

## 2020-06-30 DIAGNOSIS — G3184 Mild cognitive impairment, so stated: Secondary | ICD-10-CM | POA: Diagnosis not present

## 2020-06-30 DIAGNOSIS — I129 Hypertensive chronic kidney disease with stage 1 through stage 4 chronic kidney disease, or unspecified chronic kidney disease: Secondary | ICD-10-CM | POA: Diagnosis not present

## 2020-06-30 DIAGNOSIS — J984 Other disorders of lung: Secondary | ICD-10-CM | POA: Diagnosis not present

## 2020-06-30 DIAGNOSIS — I420 Dilated cardiomyopathy: Secondary | ICD-10-CM | POA: Diagnosis not present

## 2020-06-30 DIAGNOSIS — R918 Other nonspecific abnormal finding of lung field: Secondary | ICD-10-CM | POA: Diagnosis not present

## 2020-06-30 DIAGNOSIS — I700 Atherosclerosis of aorta: Secondary | ICD-10-CM | POA: Diagnosis not present

## 2020-06-30 DIAGNOSIS — J9691 Respiratory failure, unspecified with hypoxia: Secondary | ICD-10-CM | POA: Diagnosis not present

## 2020-06-30 DIAGNOSIS — M2550 Pain in unspecified joint: Secondary | ICD-10-CM | POA: Diagnosis not present

## 2020-06-30 DIAGNOSIS — E785 Hyperlipidemia, unspecified: Secondary | ICD-10-CM | POA: Diagnosis not present

## 2020-06-30 DIAGNOSIS — I495 Sick sinus syndrome: Secondary | ICD-10-CM | POA: Diagnosis not present

## 2020-06-30 DIAGNOSIS — J90 Pleural effusion, not elsewhere classified: Secondary | ICD-10-CM | POA: Diagnosis not present

## 2020-06-30 DIAGNOSIS — J9600 Acute respiratory failure, unspecified whether with hypoxia or hypercapnia: Secondary | ICD-10-CM | POA: Diagnosis not present

## 2020-06-30 DIAGNOSIS — R1310 Dysphagia, unspecified: Secondary | ICD-10-CM | POA: Diagnosis not present

## 2020-06-30 DIAGNOSIS — R600 Localized edema: Secondary | ICD-10-CM | POA: Diagnosis not present

## 2020-06-30 DIAGNOSIS — R9431 Abnormal electrocardiogram [ECG] [EKG]: Secondary | ICD-10-CM | POA: Diagnosis not present

## 2020-06-30 DIAGNOSIS — I080 Rheumatic disorders of both mitral and aortic valves: Secondary | ICD-10-CM | POA: Diagnosis not present

## 2020-06-30 DIAGNOSIS — I708 Atherosclerosis of other arteries: Secondary | ICD-10-CM | POA: Diagnosis not present

## 2020-06-30 DIAGNOSIS — I96 Gangrene, not elsewhere classified: Secondary | ICD-10-CM | POA: Diagnosis not present

## 2020-06-30 DIAGNOSIS — Y999 Unspecified external cause status: Secondary | ICD-10-CM | POA: Diagnosis not present

## 2020-06-30 DIAGNOSIS — I130 Hypertensive heart and chronic kidney disease with heart failure and stage 1 through stage 4 chronic kidney disease, or unspecified chronic kidney disease: Secondary | ICD-10-CM | POA: Diagnosis not present

## 2020-06-30 DIAGNOSIS — D509 Iron deficiency anemia, unspecified: Secondary | ICD-10-CM | POA: Diagnosis not present

## 2020-06-30 DIAGNOSIS — W19XXXA Unspecified fall, initial encounter: Secondary | ICD-10-CM | POA: Diagnosis not present

## 2020-06-30 DIAGNOSIS — R109 Unspecified abdominal pain: Secondary | ICD-10-CM | POA: Diagnosis not present

## 2020-06-30 DIAGNOSIS — T148XXA Other injury of unspecified body region, initial encounter: Secondary | ICD-10-CM | POA: Diagnosis not present

## 2020-06-30 DIAGNOSIS — Z515 Encounter for palliative care: Secondary | ICD-10-CM | POA: Diagnosis not present

## 2020-06-30 DIAGNOSIS — Z872 Personal history of diseases of the skin and subcutaneous tissue: Secondary | ICD-10-CM | POA: Diagnosis not present

## 2020-06-30 DIAGNOSIS — S81812D Laceration without foreign body, left lower leg, subsequent encounter: Secondary | ICD-10-CM | POA: Diagnosis not present

## 2020-06-30 DIAGNOSIS — D72829 Elevated white blood cell count, unspecified: Secondary | ICD-10-CM | POA: Diagnosis not present

## 2020-06-30 DIAGNOSIS — N189 Chronic kidney disease, unspecified: Secondary | ICD-10-CM | POA: Diagnosis not present

## 2020-06-30 DIAGNOSIS — L03115 Cellulitis of right lower limb: Secondary | ICD-10-CM | POA: Diagnosis not present

## 2020-06-30 DIAGNOSIS — T80211A Bloodstream infection due to central venous catheter, initial encounter: Secondary | ICD-10-CM | POA: Diagnosis not present

## 2020-06-30 DIAGNOSIS — Z20822 Contact with and (suspected) exposure to covid-19: Secondary | ICD-10-CM | POA: Diagnosis not present

## 2020-06-30 DIAGNOSIS — K219 Gastro-esophageal reflux disease without esophagitis: Secondary | ICD-10-CM | POA: Diagnosis not present

## 2020-06-30 DIAGNOSIS — J189 Pneumonia, unspecified organism: Secondary | ICD-10-CM | POA: Diagnosis not present

## 2020-06-30 DIAGNOSIS — M79604 Pain in right leg: Secondary | ICD-10-CM | POA: Diagnosis not present

## 2020-06-30 DIAGNOSIS — I5043 Acute on chronic combined systolic (congestive) and diastolic (congestive) heart failure: Secondary | ICD-10-CM | POA: Diagnosis not present

## 2020-06-30 DIAGNOSIS — L03116 Cellulitis of left lower limb: Secondary | ICD-10-CM | POA: Diagnosis not present

## 2020-06-30 DIAGNOSIS — B998 Other infectious disease: Secondary | ICD-10-CM | POA: Diagnosis not present

## 2020-06-30 DIAGNOSIS — L089 Local infection of the skin and subcutaneous tissue, unspecified: Secondary | ICD-10-CM | POA: Diagnosis not present

## 2020-06-30 DIAGNOSIS — J811 Chronic pulmonary edema: Secondary | ICD-10-CM | POA: Diagnosis not present

## 2020-06-30 DIAGNOSIS — K2970 Gastritis, unspecified, without bleeding: Secondary | ICD-10-CM | POA: Diagnosis not present

## 2020-06-30 DIAGNOSIS — D518 Other vitamin B12 deficiency anemias: Secondary | ICD-10-CM | POA: Diagnosis not present

## 2020-06-30 DIAGNOSIS — N1832 Chronic kidney disease, stage 3b: Secondary | ICD-10-CM | POA: Diagnosis not present

## 2020-06-30 DIAGNOSIS — E559 Vitamin D deficiency, unspecified: Secondary | ICD-10-CM | POA: Diagnosis not present

## 2020-06-30 DIAGNOSIS — A4189 Other specified sepsis: Secondary | ICD-10-CM | POA: Diagnosis not present

## 2020-06-30 DIAGNOSIS — I272 Pulmonary hypertension, unspecified: Secondary | ICD-10-CM | POA: Diagnosis not present

## 2020-06-30 DIAGNOSIS — Z66 Do not resuscitate: Secondary | ICD-10-CM | POA: Diagnosis not present

## 2020-06-30 DIAGNOSIS — I5042 Chronic combined systolic (congestive) and diastolic (congestive) heart failure: Secondary | ICD-10-CM | POA: Diagnosis not present

## 2020-06-30 DIAGNOSIS — E119 Type 2 diabetes mellitus without complications: Secondary | ICD-10-CM | POA: Diagnosis not present

## 2020-06-30 DIAGNOSIS — R531 Weakness: Secondary | ICD-10-CM | POA: Diagnosis not present

## 2020-06-30 DIAGNOSIS — R079 Chest pain, unspecified: Secondary | ICD-10-CM | POA: Diagnosis not present

## 2020-06-30 DIAGNOSIS — D649 Anemia, unspecified: Secondary | ICD-10-CM | POA: Diagnosis not present

## 2020-06-30 DIAGNOSIS — F331 Major depressive disorder, recurrent, moderate: Secondary | ICD-10-CM | POA: Diagnosis not present

## 2020-06-30 DIAGNOSIS — E875 Hyperkalemia: Secondary | ICD-10-CM | POA: Diagnosis not present

## 2020-06-30 DIAGNOSIS — E039 Hypothyroidism, unspecified: Secondary | ICD-10-CM | POA: Diagnosis not present

## 2020-06-30 DIAGNOSIS — E8779 Other fluid overload: Secondary | ICD-10-CM | POA: Diagnosis not present

## 2020-06-30 DIAGNOSIS — J159 Unspecified bacterial pneumonia: Secondary | ICD-10-CM | POA: Diagnosis not present

## 2020-06-30 DIAGNOSIS — M79605 Pain in left leg: Secondary | ICD-10-CM | POA: Diagnosis not present

## 2020-06-30 DIAGNOSIS — R0602 Shortness of breath: Secondary | ICD-10-CM | POA: Diagnosis not present

## 2020-06-30 DIAGNOSIS — R7881 Bacteremia: Secondary | ICD-10-CM | POA: Diagnosis not present

## 2020-06-30 DIAGNOSIS — R6520 Severe sepsis without septic shock: Secondary | ICD-10-CM | POA: Diagnosis not present

## 2020-06-30 DIAGNOSIS — E038 Other specified hypothyroidism: Secondary | ICD-10-CM | POA: Diagnosis not present

## 2020-07-10 DIAGNOSIS — I130 Hypertensive heart and chronic kidney disease with heart failure and stage 1 through stage 4 chronic kidney disease, or unspecified chronic kidney disease: Secondary | ICD-10-CM | POA: Diagnosis not present

## 2020-07-10 DIAGNOSIS — E119 Type 2 diabetes mellitus without complications: Secondary | ICD-10-CM | POA: Diagnosis not present

## 2020-07-10 DIAGNOSIS — E039 Hypothyroidism, unspecified: Secondary | ICD-10-CM | POA: Diagnosis not present

## 2020-07-10 DIAGNOSIS — D518 Other vitamin B12 deficiency anemias: Secondary | ICD-10-CM | POA: Diagnosis not present

## 2020-07-10 DIAGNOSIS — E038 Other specified hypothyroidism: Secondary | ICD-10-CM | POA: Diagnosis not present

## 2020-07-10 DIAGNOSIS — I5042 Chronic combined systolic (congestive) and diastolic (congestive) heart failure: Secondary | ICD-10-CM | POA: Diagnosis not present

## 2020-07-10 DIAGNOSIS — E1122 Type 2 diabetes mellitus with diabetic chronic kidney disease: Secondary | ICD-10-CM | POA: Diagnosis not present

## 2020-07-10 DIAGNOSIS — F331 Major depressive disorder, recurrent, moderate: Secondary | ICD-10-CM | POA: Diagnosis not present

## 2020-07-10 DIAGNOSIS — E559 Vitamin D deficiency, unspecified: Secondary | ICD-10-CM | POA: Diagnosis not present

## 2020-07-26 DIAGNOSIS — S81812D Laceration without foreign body, left lower leg, subsequent encounter: Secondary | ICD-10-CM | POA: Diagnosis not present

## 2020-07-26 DIAGNOSIS — I130 Hypertensive heart and chronic kidney disease with heart failure and stage 1 through stage 4 chronic kidney disease, or unspecified chronic kidney disease: Secondary | ICD-10-CM | POA: Diagnosis not present

## 2020-07-26 DIAGNOSIS — I5043 Acute on chronic combined systolic (congestive) and diastolic (congestive) heart failure: Secondary | ICD-10-CM | POA: Diagnosis not present

## 2020-07-26 DIAGNOSIS — E1122 Type 2 diabetes mellitus with diabetic chronic kidney disease: Secondary | ICD-10-CM | POA: Diagnosis not present

## 2020-07-26 DIAGNOSIS — N1832 Chronic kidney disease, stage 3b: Secondary | ICD-10-CM | POA: Diagnosis not present

## 2020-07-28 ENCOUNTER — Telehealth: Payer: Self-pay | Admitting: Family Medicine

## 2020-07-28 ENCOUNTER — Inpatient Hospital Stay: Payer: Medicare HMO

## 2020-07-28 NOTE — Telephone Encounter (Signed)
Send a condolence card

## 2020-07-28 NOTE — Telephone Encounter (Signed)
Misti Miles with Hospice called and states patient passed away yesterday at 1:06 am from Sepsis and Aspiration Pneumonia ,

## 2020-07-29 ENCOUNTER — Inpatient Hospital Stay: Payer: Medicare HMO

## 2020-08-20 DEATH — deceased

## 2020-08-26 ENCOUNTER — Other Ambulatory Visit: Payer: Medicare HMO

## 2020-08-27 ENCOUNTER — Ambulatory Visit: Payer: Medicare HMO

## 2020-09-25 ENCOUNTER — Other Ambulatory Visit: Payer: Medicare HMO

## 2020-09-25 ENCOUNTER — Ambulatory Visit: Payer: Medicare HMO | Admitting: Oncology

## 2020-09-26 ENCOUNTER — Ambulatory Visit: Payer: Medicare HMO

## 2021-12-07 IMAGING — DX DG FINGER MIDDLE 2+V*L*
1 series · 3 of 3 positions shown · non-contrast
Comparison: None.

CLINICAL DATA: Pt reports pain in left middle finger. During exam
pt replaced the ring on her finger after initial removal.

EXAM:
LEFT MIDDLE FINGER 2+V

[Series 1: finger · 0.14mm/px · 3 of 3 slices shown]
[im 1/3]
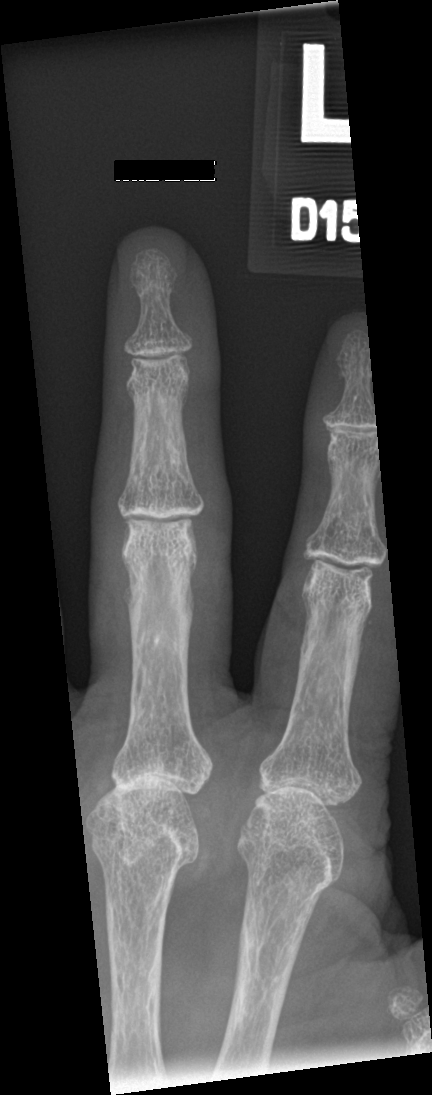
[im 2/3]
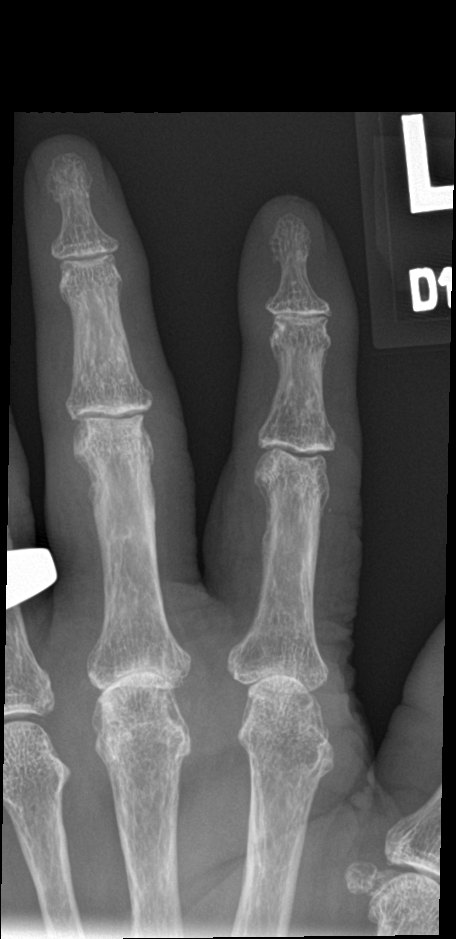
[im 3/3]
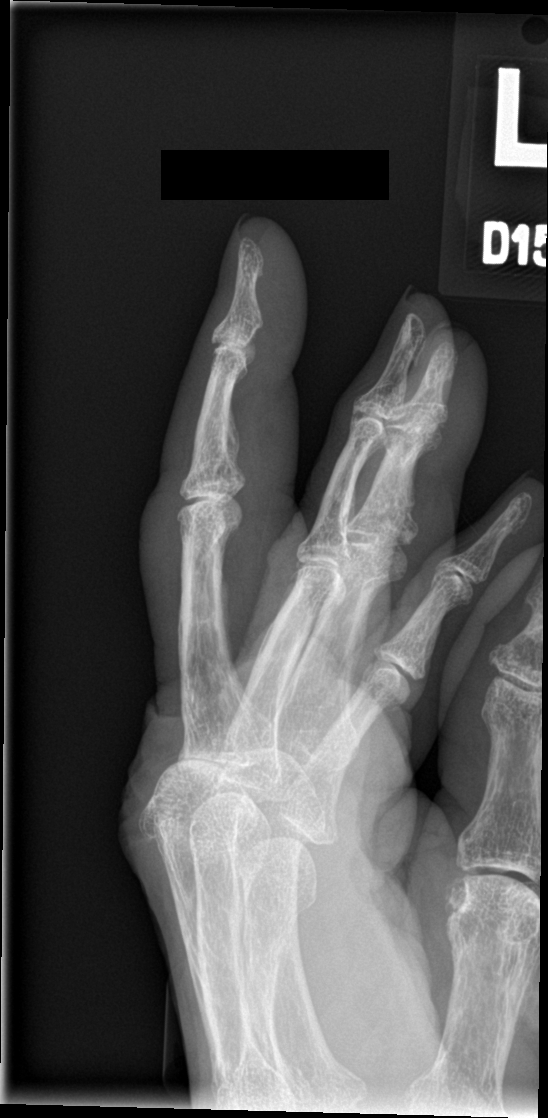

[3 of 3 positions shown; findings below may reference images not displayed]

FINDINGS: Osteopenia. There is no evidence of fracture or dislocation.
Moderate degenerative changes. Soft tissues are unremarkable.
IMPRESSION: No acute osseous abnormality in the left middle finger.
